# Patient Record
Sex: Male | Born: 1940
Health system: Southern US, Community
[De-identification: ages and names within clinical notes are randomized; demographics above are authoritative.]

## PROBLEM LIST (undated history)

## (undated) DIAGNOSIS — I10 Essential (primary) hypertension: Secondary | ICD-10-CM

## (undated) DIAGNOSIS — I639 Cerebral infarction, unspecified: Secondary | ICD-10-CM

## (undated) DIAGNOSIS — E119 Type 2 diabetes mellitus without complications: Secondary | ICD-10-CM

## (undated) DIAGNOSIS — M109 Gout, unspecified: Secondary | ICD-10-CM

## (undated) DIAGNOSIS — Z9889 Other specified postprocedural states: Secondary | ICD-10-CM

## (undated) DIAGNOSIS — Z8589 Personal history of malignant neoplasm of other organs and systems: Secondary | ICD-10-CM

## (undated) DIAGNOSIS — I471 Supraventricular tachycardia, unspecified: Secondary | ICD-10-CM

## (undated) DIAGNOSIS — C9000 Multiple myeloma not having achieved remission: Secondary | ICD-10-CM

## (undated) DIAGNOSIS — D62 Acute posthemorrhagic anemia: Secondary | ICD-10-CM

## (undated) DIAGNOSIS — R29898 Other symptoms and signs involving the musculoskeletal system: Secondary | ICD-10-CM

## (undated) DIAGNOSIS — M1990 Unspecified osteoarthritis, unspecified site: Secondary | ICD-10-CM

## (undated) DIAGNOSIS — C801 Malignant (primary) neoplasm, unspecified: Secondary | ICD-10-CM

## (undated) DIAGNOSIS — D649 Anemia, unspecified: Secondary | ICD-10-CM

## (undated) DIAGNOSIS — K219 Gastro-esophageal reflux disease without esophagitis: Secondary | ICD-10-CM

## (undated) DIAGNOSIS — R269 Unspecified abnormalities of gait and mobility: Secondary | ICD-10-CM

## (undated) HISTORY — PX: OTHER SURGICAL HISTORY: SHX169

## (undated) HISTORY — DX: Personal history of malignant neoplasm of other organs and systems: Z85.89

## (undated) HISTORY — DX: Cerebral infarction, unspecified: I63.9

## (undated) HISTORY — DX: Acute posthemorrhagic anemia: D62

## (undated) HISTORY — PX: SQUAMOUS CELL CARCINOMA EXCISION: SHX2433

## (undated) HISTORY — PX: BASAL CELL CARCINOMA EXCISION: SHX1214

## (undated) HISTORY — DX: Unspecified abnormalities of gait and mobility: R26.9

## (undated) HISTORY — DX: Essential (primary) hypertension: I10

## (undated) HISTORY — PX: ATRIAL FIBRILLATION ABLATION: EP1191

---

## 2008-09-23 ENCOUNTER — Emergency Department (HOSPITAL_COMMUNITY): Admission: EM | Admit: 2008-09-23 | Discharge: 2008-09-23 | Payer: Self-pay | Admitting: Emergency Medicine

## 2009-04-14 ENCOUNTER — Encounter: Admission: RE | Admit: 2009-04-14 | Discharge: 2009-04-14 | Payer: Self-pay | Admitting: Family Medicine

## 2011-09-24 LAB — DIFFERENTIAL
Basophils Relative: 1
Monocytes Absolute: 0.4
Monocytes Relative: 7
Neutro Abs: 3.3

## 2011-09-24 LAB — APTT: aPTT: 32

## 2011-09-24 LAB — CBC
HCT: 35.3 — ABNORMAL LOW
Hemoglobin: 11.7 — ABNORMAL LOW
MCHC: 33.2
MCV: 83.4
RBC: 4.23

## 2012-03-24 DIAGNOSIS — R972 Elevated prostate specific antigen [PSA]: Secondary | ICD-10-CM | POA: Diagnosis not present

## 2012-03-24 DIAGNOSIS — E119 Type 2 diabetes mellitus without complications: Secondary | ICD-10-CM | POA: Diagnosis not present

## 2012-07-06 DIAGNOSIS — M109 Gout, unspecified: Secondary | ICD-10-CM | POA: Diagnosis not present

## 2012-07-06 DIAGNOSIS — E119 Type 2 diabetes mellitus without complications: Secondary | ICD-10-CM | POA: Diagnosis not present

## 2012-07-06 DIAGNOSIS — I10 Essential (primary) hypertension: Secondary | ICD-10-CM | POA: Diagnosis not present

## 2012-07-06 DIAGNOSIS — R972 Elevated prostate specific antigen [PSA]: Secondary | ICD-10-CM | POA: Diagnosis not present

## 2012-07-14 DIAGNOSIS — L821 Other seborrheic keratosis: Secondary | ICD-10-CM | POA: Diagnosis not present

## 2012-07-14 DIAGNOSIS — C44519 Basal cell carcinoma of skin of other part of trunk: Secondary | ICD-10-CM | POA: Diagnosis not present

## 2012-07-14 DIAGNOSIS — C4402 Squamous cell carcinoma of skin of lip: Secondary | ICD-10-CM | POA: Diagnosis not present

## 2012-07-14 DIAGNOSIS — Z85828 Personal history of other malignant neoplasm of skin: Secondary | ICD-10-CM | POA: Diagnosis not present

## 2012-07-14 DIAGNOSIS — D485 Neoplasm of uncertain behavior of skin: Secondary | ICD-10-CM | POA: Diagnosis not present

## 2012-07-14 DIAGNOSIS — L570 Actinic keratosis: Secondary | ICD-10-CM | POA: Diagnosis not present

## 2012-07-14 DIAGNOSIS — L819 Disorder of pigmentation, unspecified: Secondary | ICD-10-CM | POA: Diagnosis not present

## 2012-07-14 DIAGNOSIS — D239 Other benign neoplasm of skin, unspecified: Secondary | ICD-10-CM | POA: Diagnosis not present

## 2012-07-21 DIAGNOSIS — H43819 Vitreous degeneration, unspecified eye: Secondary | ICD-10-CM | POA: Diagnosis not present

## 2012-07-28 DIAGNOSIS — C4402 Squamous cell carcinoma of skin of lip: Secondary | ICD-10-CM | POA: Diagnosis not present

## 2012-08-13 DIAGNOSIS — H43819 Vitreous degeneration, unspecified eye: Secondary | ICD-10-CM | POA: Diagnosis not present

## 2012-08-13 DIAGNOSIS — E119 Type 2 diabetes mellitus without complications: Secondary | ICD-10-CM | POA: Diagnosis not present

## 2012-08-13 DIAGNOSIS — H52209 Unspecified astigmatism, unspecified eye: Secondary | ICD-10-CM | POA: Diagnosis not present

## 2012-08-13 DIAGNOSIS — H2510 Age-related nuclear cataract, unspecified eye: Secondary | ICD-10-CM | POA: Diagnosis not present

## 2012-08-19 DIAGNOSIS — L570 Actinic keratosis: Secondary | ICD-10-CM | POA: Diagnosis not present

## 2012-08-19 DIAGNOSIS — Z85828 Personal history of other malignant neoplasm of skin: Secondary | ICD-10-CM | POA: Diagnosis not present

## 2012-09-01 DIAGNOSIS — L905 Scar conditions and fibrosis of skin: Secondary | ICD-10-CM | POA: Diagnosis not present

## 2012-09-01 DIAGNOSIS — C44519 Basal cell carcinoma of skin of other part of trunk: Secondary | ICD-10-CM | POA: Diagnosis not present

## 2012-09-28 DIAGNOSIS — Z2089 Contact with and (suspected) exposure to other communicable diseases: Secondary | ICD-10-CM | POA: Diagnosis not present

## 2012-09-28 DIAGNOSIS — R498 Other voice and resonance disorders: Secondary | ICD-10-CM | POA: Diagnosis not present

## 2012-11-27 DIAGNOSIS — Z23 Encounter for immunization: Secondary | ICD-10-CM | POA: Diagnosis not present

## 2012-12-18 DIAGNOSIS — D1801 Hemangioma of skin and subcutaneous tissue: Secondary | ICD-10-CM | POA: Diagnosis not present

## 2012-12-18 DIAGNOSIS — L570 Actinic keratosis: Secondary | ICD-10-CM | POA: Diagnosis not present

## 2012-12-18 DIAGNOSIS — D239 Other benign neoplasm of skin, unspecified: Secondary | ICD-10-CM | POA: Diagnosis not present

## 2012-12-18 DIAGNOSIS — Z85828 Personal history of other malignant neoplasm of skin: Secondary | ICD-10-CM | POA: Diagnosis not present

## 2012-12-18 DIAGNOSIS — L821 Other seborrheic keratosis: Secondary | ICD-10-CM | POA: Diagnosis not present

## 2012-12-18 DIAGNOSIS — L819 Disorder of pigmentation, unspecified: Secondary | ICD-10-CM | POA: Diagnosis not present

## 2013-01-13 DIAGNOSIS — Z1331 Encounter for screening for depression: Secondary | ICD-10-CM | POA: Diagnosis not present

## 2013-01-13 DIAGNOSIS — M109 Gout, unspecified: Secondary | ICD-10-CM | POA: Diagnosis not present

## 2013-01-13 DIAGNOSIS — R972 Elevated prostate specific antigen [PSA]: Secondary | ICD-10-CM | POA: Diagnosis not present

## 2013-01-13 DIAGNOSIS — Z125 Encounter for screening for malignant neoplasm of prostate: Secondary | ICD-10-CM | POA: Diagnosis not present

## 2013-01-13 DIAGNOSIS — I10 Essential (primary) hypertension: Secondary | ICD-10-CM | POA: Diagnosis not present

## 2013-01-13 DIAGNOSIS — E119 Type 2 diabetes mellitus without complications: Secondary | ICD-10-CM | POA: Diagnosis not present

## 2013-01-13 DIAGNOSIS — Z0000 Encounter for general adult medical examination without abnormal findings: Secondary | ICD-10-CM | POA: Diagnosis not present

## 2013-07-01 DIAGNOSIS — I10 Essential (primary) hypertension: Secondary | ICD-10-CM | POA: Diagnosis not present

## 2013-07-01 DIAGNOSIS — E119 Type 2 diabetes mellitus without complications: Secondary | ICD-10-CM | POA: Diagnosis not present

## 2013-07-01 DIAGNOSIS — M109 Gout, unspecified: Secondary | ICD-10-CM | POA: Diagnosis not present

## 2013-08-16 DIAGNOSIS — H2510 Age-related nuclear cataract, unspecified eye: Secondary | ICD-10-CM | POA: Diagnosis not present

## 2013-08-16 DIAGNOSIS — H52209 Unspecified astigmatism, unspecified eye: Secondary | ICD-10-CM | POA: Diagnosis not present

## 2013-08-16 DIAGNOSIS — H43819 Vitreous degeneration, unspecified eye: Secondary | ICD-10-CM | POA: Diagnosis not present

## 2013-08-16 DIAGNOSIS — E119 Type 2 diabetes mellitus without complications: Secondary | ICD-10-CM | POA: Diagnosis not present

## 2013-12-24 DIAGNOSIS — D239 Other benign neoplasm of skin, unspecified: Secondary | ICD-10-CM | POA: Diagnosis not present

## 2013-12-24 DIAGNOSIS — L570 Actinic keratosis: Secondary | ICD-10-CM | POA: Diagnosis not present

## 2013-12-24 DIAGNOSIS — Z85828 Personal history of other malignant neoplasm of skin: Secondary | ICD-10-CM | POA: Diagnosis not present

## 2013-12-24 DIAGNOSIS — L819 Disorder of pigmentation, unspecified: Secondary | ICD-10-CM | POA: Diagnosis not present

## 2013-12-24 DIAGNOSIS — L821 Other seborrheic keratosis: Secondary | ICD-10-CM | POA: Diagnosis not present

## 2013-12-24 DIAGNOSIS — D1801 Hemangioma of skin and subcutaneous tissue: Secondary | ICD-10-CM | POA: Diagnosis not present

## 2013-12-24 DIAGNOSIS — L820 Inflamed seborrheic keratosis: Secondary | ICD-10-CM | POA: Diagnosis not present

## 2013-12-31 DIAGNOSIS — Z23 Encounter for immunization: Secondary | ICD-10-CM | POA: Diagnosis not present

## 2014-01-11 DIAGNOSIS — L570 Actinic keratosis: Secondary | ICD-10-CM | POA: Diagnosis not present

## 2014-03-08 DIAGNOSIS — L570 Actinic keratosis: Secondary | ICD-10-CM | POA: Diagnosis not present

## 2014-04-28 DIAGNOSIS — J329 Chronic sinusitis, unspecified: Secondary | ICD-10-CM | POA: Diagnosis not present

## 2014-06-13 DIAGNOSIS — Z0000 Encounter for general adult medical examination without abnormal findings: Secondary | ICD-10-CM | POA: Diagnosis not present

## 2014-06-13 DIAGNOSIS — I10 Essential (primary) hypertension: Secondary | ICD-10-CM | POA: Diagnosis not present

## 2014-06-13 DIAGNOSIS — E119 Type 2 diabetes mellitus without complications: Secondary | ICD-10-CM | POA: Diagnosis not present

## 2014-06-13 DIAGNOSIS — M109 Gout, unspecified: Secondary | ICD-10-CM | POA: Diagnosis not present

## 2014-06-13 DIAGNOSIS — N529 Male erectile dysfunction, unspecified: Secondary | ICD-10-CM | POA: Diagnosis not present

## 2014-06-13 DIAGNOSIS — R972 Elevated prostate specific antigen [PSA]: Secondary | ICD-10-CM | POA: Diagnosis not present

## 2014-06-13 DIAGNOSIS — K219 Gastro-esophageal reflux disease without esophagitis: Secondary | ICD-10-CM | POA: Diagnosis not present

## 2014-06-13 DIAGNOSIS — Z23 Encounter for immunization: Secondary | ICD-10-CM | POA: Diagnosis not present

## 2014-06-13 DIAGNOSIS — Z125 Encounter for screening for malignant neoplasm of prostate: Secondary | ICD-10-CM | POA: Diagnosis not present

## 2014-07-25 DIAGNOSIS — M109 Gout, unspecified: Secondary | ICD-10-CM | POA: Diagnosis not present

## 2014-08-06 DIAGNOSIS — H6980 Other specified disorders of Eustachian tube, unspecified ear: Secondary | ICD-10-CM | POA: Diagnosis not present

## 2014-08-22 DIAGNOSIS — H2510 Age-related nuclear cataract, unspecified eye: Secondary | ICD-10-CM | POA: Diagnosis not present

## 2014-08-22 DIAGNOSIS — E119 Type 2 diabetes mellitus without complications: Secondary | ICD-10-CM | POA: Diagnosis not present

## 2014-08-22 DIAGNOSIS — H52209 Unspecified astigmatism, unspecified eye: Secondary | ICD-10-CM | POA: Diagnosis not present

## 2014-08-22 DIAGNOSIS — H43819 Vitreous degeneration, unspecified eye: Secondary | ICD-10-CM | POA: Diagnosis not present

## 2014-11-16 DIAGNOSIS — E119 Type 2 diabetes mellitus without complications: Secondary | ICD-10-CM | POA: Diagnosis not present

## 2014-11-16 DIAGNOSIS — Z23 Encounter for immunization: Secondary | ICD-10-CM | POA: Diagnosis not present

## 2014-12-07 ENCOUNTER — Encounter (HOSPITAL_COMMUNITY): Payer: Self-pay | Admitting: Emergency Medicine

## 2014-12-07 ENCOUNTER — Observation Stay (HOSPITAL_COMMUNITY)
Admission: EM | Admit: 2014-12-07 | Discharge: 2014-12-09 | Disposition: A | Discharge status: Home / Self Care | Payer: Medicare Other | Attending: Internal Medicine | Admitting: Internal Medicine

## 2014-12-07 ENCOUNTER — Emergency Department (HOSPITAL_COMMUNITY): Payer: Medicare Other

## 2014-12-07 DIAGNOSIS — E119 Type 2 diabetes mellitus without complications: Secondary | ICD-10-CM

## 2014-12-07 DIAGNOSIS — E118 Type 2 diabetes mellitus with unspecified complications: Secondary | ICD-10-CM

## 2014-12-07 DIAGNOSIS — R079 Chest pain, unspecified: Principal | ICD-10-CM | POA: Insufficient documentation

## 2014-12-07 DIAGNOSIS — I10 Essential (primary) hypertension: Secondary | ICD-10-CM | POA: Diagnosis not present

## 2014-12-07 DIAGNOSIS — Z7982 Long term (current) use of aspirin: Secondary | ICD-10-CM | POA: Insufficient documentation

## 2014-12-07 DIAGNOSIS — R071 Chest pain on breathing: Secondary | ICD-10-CM

## 2014-12-07 DIAGNOSIS — R0789 Other chest pain: Secondary | ICD-10-CM

## 2014-12-07 DIAGNOSIS — R918 Other nonspecific abnormal finding of lung field: Secondary | ICD-10-CM | POA: Diagnosis not present

## 2014-12-07 DIAGNOSIS — K219 Gastro-esophageal reflux disease without esophagitis: Secondary | ICD-10-CM | POA: Diagnosis not present

## 2014-12-07 DIAGNOSIS — M109 Gout, unspecified: Secondary | ICD-10-CM | POA: Diagnosis not present

## 2014-12-07 DIAGNOSIS — Z79899 Other long term (current) drug therapy: Secondary | ICD-10-CM | POA: Insufficient documentation

## 2014-12-07 HISTORY — DX: Essential (primary) hypertension: I10

## 2014-12-07 HISTORY — DX: Gastro-esophageal reflux disease without esophagitis: K21.9

## 2014-12-07 HISTORY — DX: Type 2 diabetes mellitus with unspecified complications: E11.8

## 2014-12-07 HISTORY — DX: Gout, unspecified: M10.9

## 2014-12-07 HISTORY — DX: Type 2 diabetes mellitus without complications: E11.9

## 2014-12-07 LAB — BASIC METABOLIC PANEL
ANION GAP: 16 — AB (ref 5–15)
BUN: 20 mg/dL (ref 6–23)
CALCIUM: 9.1 mg/dL (ref 8.4–10.5)
CO2: 22 mEq/L (ref 19–32)
CREATININE: 0.93 mg/dL (ref 0.50–1.35)
Chloride: 100 mEq/L (ref 96–112)
GFR, EST NON AFRICAN AMERICAN: 81 mL/min — AB (ref >90)
Glucose, Bld: 115 mg/dL — ABNORMAL HIGH (ref 70–99)
Potassium: 4 mEq/L (ref 3.7–5.3)
SODIUM: 138 meq/L (ref 137–147)

## 2014-12-07 LAB — CBC WITH DIFFERENTIAL/PLATELET
BASOS ABS: 0 10*3/uL (ref 0.0–0.1)
Basophils Relative: 0 % (ref 0–1)
EOS ABS: 0.2 10*3/uL (ref 0.0–0.7)
EOS PCT: 2 % (ref 0–5)
HCT: 35.7 % — ABNORMAL LOW (ref 39.0–52.0)
Hemoglobin: 11.6 g/dL — ABNORMAL LOW (ref 13.0–17.0)
LYMPHS ABS: 1.5 10*3/uL (ref 0.7–4.0)
Lymphocytes Relative: 20 % (ref 12–46)
MCH: 28 pg (ref 26.0–34.0)
MCHC: 32.5 g/dL (ref 30.0–36.0)
MCV: 86 fL (ref 78.0–100.0)
Monocytes Absolute: 0.4 10*3/uL (ref 0.1–1.0)
Monocytes Relative: 6 % (ref 3–12)
NEUTROS PCT: 72 % (ref 43–77)
Neutro Abs: 5.2 10*3/uL (ref 1.7–7.7)
PLATELETS: 195 10*3/uL (ref 150–400)
RBC: 4.15 MIL/uL — AB (ref 4.22–5.81)
RDW: 15.7 % — AB (ref 11.5–15.5)
WBC: 7.3 10*3/uL (ref 4.0–10.5)

## 2014-12-07 LAB — TROPONIN I

## 2014-12-07 NOTE — ED Notes (Signed)
Pt to ED via [facility] from [facility] urgent care for evaluation of CP ongoing since 2pm.  Pt reports mild chest pain with activity around 2pm that resided with rest.  Pain became present again later and pt went to urgent care.  Pt has had 324 ASA and 1 Nitro bringing pain from 4/10 to 0/10.  Pt remains pain free at this time, denies SOB or N/V.  Pt alert and oriented x 4, respirations e/u.

## 2014-12-07 NOTE — H&P (Addendum)
PCP: Derrill Memo   Chief Complaint:  Chest pain HPI: Justin [name] is a 73 y.o. male   has a past medical history of Hypertension; Diabetes mellitus without complication; GERD (gastroesophageal reflux disease); and Gout.   Presented with  At 2 pm patient was sitting at his computer and developed chest pain in the center of the chest radiating to both shoulders. Patient went to Urgent care and was given nitroglycerine which has helped. Patient reports that pain lasted for 4 hours. No associated shortness of breath. Describes pain as dull reports that taking deep breaths were making it worse. Moving around made his shoulders hurt worse. Denies any leg swelling. No recent travel hx, has active life style.  Currently chest pain free.  Patietn has hx of well controlled HTN. He has hx of DM that is diet controlled. No prior hx of CAD. Last stress test was many years ago and was unremarkable. He is unsure who did the test.   Hospitalist was called for admission for Chest pain  Review of Systems:    Pertinent positives include:  chest pain,  Constitutional:  No weight loss, night sweats, Fevers, chills, fatigue, weight loss  HEENT:  No headaches, Difficulty swallowing,Tooth/dental problems,Sore throat,  No sneezing, itching, ear ache, nasal congestion, post nasal drip,  Cardio-vascular:  No Orthopnea, PND, anasarca, dizziness, palpitations.no Bilateral lower extremity swelling  GI:  No heartburn, indigestion, abdominal pain, nausea, vomiting, diarrhea, change in bowel habits, loss of appetite, melena, blood in stool, hematemesis Resp:  no shortness of breath at rest. No dyspnea on exertion, No excess mucus, no productive cough, No non-productive cough, No coughing up of blood.No change in color of mucus.No wheezing. Skin:  no rash or lesions. No jaundice GU:  no dysuria, change in color of urine, no urgency or frequency. No straining to urinate.  No flank pain.  Musculoskeletal:  No  joint pain or no joint swelling. No decreased range of motion. No back pain.  Psych:  No change in mood or affect. No depression or anxiety. No memory loss.  Neuro: no localizing neurological complaints, no tingling, no weakness, no double vision, no gait abnormality, no slurred speech, no confusion  Otherwise ROS are negative except for above, 10 systems were reviewed  Past Medical History: Past Medical History  Diagnosis Date  . Hypertension   . Diabetes mellitus without complication   . GERD (gastroesophageal reflux disease)   . Gout    Past Surgical History  Procedure Laterality Date  . Squamous cell carcinoma excision       Medications: Prior to Admission medications   Medication Sig Start Date End Date Taking? Authorizing Provider  allopurinol (ZYLOPRIM) 300 MG tablet Take 300 mg by mouth daily.   Yes Historical Provider, MD  aspirin EC 81 MG tablet Take 81 mg by mouth daily.   Yes Historical Provider, MD  ibuprofen (ADVIL,MOTRIN) 200 MG tablet Take 400 mg by mouth every 6 (six) hours as needed for moderate pain.   Yes Historical Provider, MD  Multiple Vitamin (MULTIVITAMIN WITH MINERALS) TABS tablet Take 1 tablet by mouth as needed (TAKES OCCASIONALLY).   Yes Historical Provider, MD  omeprazole (PRILOSEC) 20 MG capsule Take 20 mg by mouth daily.   Yes Historical Provider, MD  telmisartan-hydrochlorothiazide (MICARDIS HCT) 80-12.5 MG per tablet Take 0.5 tablets by mouth daily.   Yes Historical Provider, MD    Allergies:  No Known Allergies  Social History:  Ambulatory   independently   Lives at  home With family     reports that he has never smoked. He does not have any smokeless tobacco history on file. He reports that he does not drink alcohol or use illicit drugs.    Family History: family history includes Alzheimer's disease in his father; Down syndrome in his daughter; Hypertension in his mother; Stroke in his mother.    Physical Exam: Patient Vitals for the  past 24 hrs:  BP Temp Pulse Resp SpO2 Height Weight  12/07/14 2225 121/74 mmHg - 70 12 100 % - -  12/07/14 2145 125/73 mmHg - 66 17 98 % - -  12/07/14 2115 122/73 mmHg - 72 13 96 % - -  12/07/14 2100 113/66 mmHg - 73 21 100 % - -  12/07/14 2045 117/77 mmHg - 71 12 100 % - -  12/07/14 2032 117/66 mmHg 98.7 F (37.1 C) - 17 100 % 5\' 9"  (1.753 m) 81.647 kg (180 lb)    1. General:  in No Acute distress 2. Psychological: Alert and  Oriented 3. Head/ENT:   Moist   Mucous Membranes                          Head Non traumatic, neck supple                          Normal  Dentition 4. SKIN:  decreased Skin turgor,  Skin clean Dry and intact no rash 5. Heart: Regular rate and rhythm no Murmur, Rub or gallop 6. Lungs:  no wheezes or crackles   7. Abdomen: Soft, non-tender, Non distended 8. Lower extremities: no clubbing, cyanosis, or edema 9. Neurologically Grossly intact, moving all 4 extremities equally 10. MSK: Normal range of motion  body mass index is 26.57 kg/(m^2).   Labs on Admission:   Results for orders placed or performed during the hospital encounter of 12/07/14 (from the past 24 hour(s))  CBC with Differential     Status: Abnormal   Collection Time: 12/07/14  9:22 PM  Result Value Ref Range   WBC 7.3 4.0 - 10.5 K/uL   RBC 4.15 (L) 4.22 - 5.81 MIL/uL   Hemoglobin 11.6 (L) 13.0 - 17.0 g/dL   HCT 35.7 (L) 39.0 - 52.0 %   MCV 86.0 78.0 - 100.0 fL   MCH 28.0 26.0 - 34.0 pg   MCHC 32.5 30.0 - 36.0 g/dL   RDW 15.7 (H) 11.5 - 15.5 %   Platelets 195 150 - 400 K/uL   Neutrophils Relative % 72 43 - 77 %   Neutro Abs 5.2 1.7 - 7.7 K/uL   Lymphocytes Relative 20 12 - 46 %   Lymphs Abs 1.5 0.7 - 4.0 K/uL   Monocytes Relative 6 3 - 12 %   Monocytes Absolute 0.4 0.1 - 1.0 K/uL   Eosinophils Relative 2 0 - 5 %   Eosinophils Absolute 0.2 0.0 - 0.7 K/uL   Basophils Relative 0 0 - 1 %   Basophils Absolute 0.0 0.0 - 0.1 K/uL  Troponin I     Status: None   Collection Time: 12/07/14   9:22 PM  Result Value Ref Range   Troponin I <0.30 <0.30 ng/mL  Basic metabolic panel     Status: Abnormal   Collection Time: 12/07/14  9:22 PM  Result Value Ref Range   Sodium 138 137 - 147 mEq/L   Potassium 4.0 3.7 - 5.3 mEq/L  Chloride 100 96 - 112 mEq/L   CO2 22 19 - 32 mEq/L   Glucose, Bld 115 (H) 70 - 99 mg/dL   BUN 20 6 - 23 mg/dL   Creatinine, Ser 0.93 0.50 - 1.35 mg/dL   Calcium 9.1 8.4 - 10.5 mg/dL   GFR calc non Af Amer 81 (L) >90 mL/min   GFR calc Af Amer >90 >90 mL/min   Anion gap 16 (H) 5 - 15    UA not obtained  No results found for: HGBA1C  Estimated Creatinine Clearance: 69.7 mL/min (by C-G formula based on Cr of 0.93).  BNP (last 3 results) No results for input(s): PROBNP in the last 8760 hours.  Other results:  I have pearsonaly reviewed this: ECG REPORT  Rate: 62  Rhythm: nsr ST&T Change: no ischemic changes   Filed Weights   12/07/14 2032  Weight: 81.647 kg (180 lb)     Cultures: No results found for: SDES, SPECREQUEST, CULT, REPTSTATUS   Radiological Exams on Admission: Dg Chest 2 View  12/07/2014   CLINICAL DATA:  Mid chest pain.  Pain radiates into both shoulders.  EXAM: CHEST  2 VIEW  COMPARISON:  04/14/2009  FINDINGS: Small densities at the left lung base are suggestive for atelectasis. No focal airspace disease or pulmonary edema. Heart and mediastinum are within normal limits. The trachea is midline. No acute bone abnormality.  IMPRESSION: Few densities at the left lung base are most compatible with atelectasis. No significant airspace disease.   Electronically Signed   By: Markus Daft M.D.   On: 12/07/2014 22:17    Chart has been reviewed  Assessment/Plan  73 yo M with risk factors of age, HTN DM here with somewhat atypical pain, HEART score of 4 admitted for further work up and risk strutification  Present on Admission:  . Chest pain - - given risk factors will admit, monitor on telemetry, cycle cardiac enzymes, obtain serial  ECG. Further risk stratify with lipid panel, hgA1C, obtain TSH. Make sure patient is on Aspirin. Further treatment based on the currently pending results. CURRENTLY NPO for possible stress test in AM. Given pleuritic component will obtain d.dimer if positive and given poor IV access will order VQ  Scan in AM given pleuritic component of pain . HTN (hypertension) - continue home meds DM diet controled while admitted will order SSI  Prophylaxis:  Lovenox, Protonix  CODE STATUS:  FULL CODE presumed for now, will need to discuss in AM. Patient was actively having IV placed at the time of discussion.   Other plan as per orders.  I have spent a total of 55 min on this admission  Justin [name] 12/07/2014, 11:19 PM  [facility]  Pager [phone]   after 2 AM please page floor coverage PA If 7AM-7PM, please contact the day team taking care of the patient  [email or web address]  Password [email or web address]

## 2014-12-07 NOTE — ED Provider Notes (Signed)
CSN: [id number]     Arrival date & time 12/07/14  2029 History   First MD Initiated Contact with Patient 12/07/14 2031     Chief Complaint  Patient presents with  . Chest Pain      Patient is a 73 y.o. male presenting with chest pain. The history is provided by the patient. No language interpreter was used.  Chest Pain  Mr. Till presents for evaluation of chest pain.  He reports that he developed central chest pain about 2 PM that radiated to both shoulders while he was doing light activity at his house. Pain is described as feeling like a reflux or gas. Symptoms were constant since when they began and worse with deep breaths. [facility] and was given a nitroglycerin, which resolved his chest pain completely. He is currently pain-free. He denies any fevers, cough, abdominal pain, vomiting. He had a stress test 4-5 years ago that was normal. He has diet-controlled diabetes and hypertension. He has no history of blood clots and has not had any leg swelling or pain.  Past Medical History  Diagnosis Date  . Hypertension   . Diabetes mellitus without complication   . GERD (gastroesophageal reflux disease)   . Gout    Past Surgical History  Procedure Laterality Date  . Squamous cell carcinoma excision     No family history on file. History  Substance Use Topics  . Smoking status: Never Smoker   . Smokeless tobacco: Not on file  . Alcohol Use: No    Review of Systems  Cardiovascular: Positive for chest pain.  All other systems reviewed and are negative.     Allergies  Review of patient's allergies indicates no known allergies.  Home Medications   Prior to Admission medications   Medication Sig Start Date End Date Taking? Authorizing Provider  allopurinol (ZYLOPRIM) 300 MG tablet Take 300 mg by mouth daily.   Yes Historical Provider, MD  aspirin EC 81 MG tablet Take 81 mg by mouth daily.   Yes Historical Provider, MD  ibuprofen  (ADVIL,MOTRIN) 200 MG tablet Take 400 mg by mouth every 6 (six) hours as needed for moderate pain.   Yes Historical Provider, MD  Multiple Vitamin (MULTIVITAMIN WITH MINERALS) TABS tablet Take 1 tablet by mouth as needed (TAKES OCCASIONALLY).   Yes Historical Provider, MD  omeprazole (PRILOSEC) 20 MG capsule Take 20 mg by mouth daily.   Yes Historical Provider, MD  telmisartan-hydrochlorothiazide (MICARDIS HCT) 80-12.5 MG per tablet Take 0.5 tablets by mouth daily.   Yes Historical Provider, MD   BP 117/66 mmHg  Temp(Src) 98.7 F (37.1 C)  Resp 17  Ht 5\' 9"  (1.753 m)  Wt 180 lb (81.647 kg)  BMI 26.57 kg/m2  SpO2 100% Physical Exam  Constitutional: He is oriented to person, place, and time. He appears well-developed and well-nourished.  HENT:  Head: Normocephalic and atraumatic.  Cardiovascular: Normal rate.   No murmur heard. Pulmonary/Chest: Effort normal. No respiratory distress.  Abdominal: Soft. There is no tenderness. There is no rebound and no guarding.  Musculoskeletal: He exhibits no edema or tenderness.  Neurological: He is alert and oriented to person, place, and time.  Skin: Skin is warm and dry.  Psychiatric: He has a normal mood and affect. His behavior is normal.  Nursing note and vitals reviewed.   ED Course  Procedures (including critical care time) Labs Review Labs Reviewed  CBC WITH DIFFERENTIAL - Abnormal; Notable for the following:  RBC 4.15 (*)    Hemoglobin 11.6 (*)    HCT 35.7 (*)    RDW 15.7 (*)    All other components within normal limits  BASIC METABOLIC PANEL - Abnormal; Notable for the following:    Glucose, Bld 115 (*)    GFR calc non Af Amer 81 (*)    Anion gap 16 (*)    All other components within normal limits  TROPONIN I    Imaging Review Dg Chest 2 View  12/07/2014   CLINICAL DATA:  Mid chest pain.  Pain radiates into both shoulders.  EXAM: CHEST  2 VIEW  COMPARISON:  04/14/2009  FINDINGS: Small densities at the left lung base are  suggestive for atelectasis. No focal airspace disease or pulmonary edema. Heart and mediastinum are within normal limits. The trachea is midline. No acute bone abnormality.  IMPRESSION: Few densities at the left lung base are most compatible with atelectasis. No significant airspace disease.   Electronically Signed   By: Markus Daft M.D.   On: 12/07/2014 22:17     EKG Interpretation   Date/Time:  Wednesday December 07 2014 20:32:18 EST Ventricular Rate:  62 PR Interval:  174 QRS Duration: 77 QT Interval:  402 QTC Calculation: 408 R Axis:   -6 Text Interpretation:  Sinus rhythm Confirmed by Hazle Coca [phone]) on  12/07/2014 8:40:09 PM      MDM   Final diagnoses:  Chest pain, unspecified chest pain type    Pt here for evaluation chest pain, heart score 4. Pain is resolved in ED after receiving ntg prior to arrival.  Clinical picture not c/w PE, dissection, pna.  D/w medicine regarding observation admit for stress testing.      Quintella Reichert, MD 12/07/14 2256

## 2014-12-07 NOTE — ED Notes (Addendum)
Report given to 3W. IV team placing IV, then pt will be transported to floor.

## 2014-12-08 ENCOUNTER — Other Ambulatory Visit: Payer: Self-pay | Admitting: Cardiology

## 2014-12-08 ENCOUNTER — Observation Stay (HOSPITAL_COMMUNITY): Payer: Medicare Other

## 2014-12-08 ENCOUNTER — Encounter (HOSPITAL_COMMUNITY): Payer: Self-pay | Admitting: General Practice

## 2014-12-08 DIAGNOSIS — I471 Supraventricular tachycardia: Secondary | ICD-10-CM

## 2014-12-08 DIAGNOSIS — R0789 Other chest pain: Secondary | ICD-10-CM | POA: Diagnosis not present

## 2014-12-08 DIAGNOSIS — R079 Chest pain, unspecified: Secondary | ICD-10-CM | POA: Diagnosis not present

## 2014-12-08 DIAGNOSIS — J9811 Atelectasis: Secondary | ICD-10-CM | POA: Diagnosis not present

## 2014-12-08 DIAGNOSIS — I10 Essential (primary) hypertension: Secondary | ICD-10-CM | POA: Diagnosis not present

## 2014-12-08 DIAGNOSIS — K219 Gastro-esophageal reflux disease without esophagitis: Secondary | ICD-10-CM | POA: Diagnosis not present

## 2014-12-08 DIAGNOSIS — E119 Type 2 diabetes mellitus without complications: Secondary | ICD-10-CM | POA: Diagnosis not present

## 2014-12-08 LAB — LIPID PANEL
Cholesterol: 154 mg/dL (ref 0–200)
HDL: 36 mg/dL — ABNORMAL LOW (ref >39)
LDL CALC: 95 mg/dL (ref 0–99)
Total CHOL/HDL Ratio: 4.3 RATIO
Triglycerides: 115 mg/dL (ref <150)
VLDL: 23 mg/dL (ref 0–40)

## 2014-12-08 LAB — GLUCOSE, CAPILLARY
GLUCOSE-CAPILLARY: 108 mg/dL — AB (ref 70–99)
GLUCOSE-CAPILLARY: 139 mg/dL — AB (ref 70–99)
Glucose-Capillary: 151 mg/dL — ABNORMAL HIGH (ref 70–99)
Glucose-Capillary: 119 mg/dL — ABNORMAL HIGH (ref 70–99)
Glucose-Capillary: 109 mg/dL — ABNORMAL HIGH (ref 70–99)

## 2014-12-08 LAB — TROPONIN I: Troponin I: <0.3 ng/mL (ref <0.30)

## 2014-12-08 LAB — D-DIMER, QUANTITATIVE (NOT AT ARMC): D DIMER QUANT: 0.76 ug{FEU}/mL — AB (ref 0.00–0.48)

## 2014-12-08 LAB — HEMOGLOBIN A1C
HEMOGLOBIN A1C: 7.2 % — AB (ref <5.7)
Mean Plasma Glucose: 160 mg/dL — ABNORMAL HIGH (ref <117)

## 2014-12-08 MED ORDER — ONDANSETRON HCL 4 MG/2ML IJ SOLN: 4.0000 mg | Freq: Four times a day (QID) | INTRAMUSCULAR | Status: DC | PRN

## 2014-12-08 MED ORDER — METOPROLOL TARTRATE 12.5 MG HALF TABLET
12.5000 mg | ORAL_TABLET | Freq: Two times a day (BID) | ORAL | Status: DC
  Administered 2014-12-08 – 2014-12-09 (×2): 12.5 mg via ORAL
  Filled 2014-12-08 (×2): qty 1

## 2014-12-08 MED ORDER — IOHEXOL 350 MG/ML SOLN
80.0000 mL | Freq: Once | INTRAVENOUS | Status: AC | PRN
  Administered 2014-12-08: 80 mL via INTRAVENOUS

## 2014-12-08 MED ORDER — TELMISARTAN-HCTZ 80-12.5 MG PO TABS: 0.5000 | ORAL_TABLET | Freq: Every day | ORAL | Status: DC

## 2014-12-08 MED ORDER — MORPHINE SULFATE 2 MG/ML IJ SOLN: 2.0000 mg | INTRAMUSCULAR | Status: DC | PRN

## 2014-12-08 MED ORDER — TECHNETIUM TC 99M SESTAMIBI GENERIC - CARDIOLITE
30.0000 | Freq: Once | INTRAVENOUS | Status: AC | PRN
  Administered 2014-12-08: 30 via INTRAVENOUS

## 2014-12-08 MED ORDER — INSULIN ASPART 100 UNIT/ML ~~LOC~~ SOLN: 0.0000 [IU] | SUBCUTANEOUS | Status: DC

## 2014-12-08 MED ORDER — ACETAMINOPHEN 325 MG PO TABS: 650.0000 mg | ORAL_TABLET | ORAL | Status: DC | PRN

## 2014-12-08 MED ORDER — HYDROCHLOROTHIAZIDE 10 MG/ML ORAL SUSPENSION
6.2500 mg | Freq: Every day | ORAL | Status: DC
Start: 2014-12-08 — End: 2014-12-09
  Administered 2014-12-08 – 2014-12-09 (×2): 6.25 mg via ORAL
  Filled 2014-12-08 (×2): qty 1.25

## 2014-12-08 MED ORDER — ENOXAPARIN SODIUM 40 MG/0.4ML ~~LOC~~ SOLN
40.0000 mg | Freq: Every day | SUBCUTANEOUS | Status: DC
  Administered 2014-12-08 – 2014-12-09 (×2): 40 mg via SUBCUTANEOUS
  Filled 2014-12-08 (×2): qty 0.4

## 2014-12-08 MED ORDER — ASPIRIN EC 81 MG PO TBEC
81.0000 mg | DELAYED_RELEASE_TABLET | Freq: Every day | ORAL | Status: DC
  Administered 2014-12-08 – 2014-12-09 (×2): 81 mg via ORAL
  Filled 2014-12-08 (×2): qty 1

## 2014-12-08 MED ORDER — ALLOPURINOL 300 MG PO TABS
300.0000 mg | ORAL_TABLET | Freq: Every day | ORAL | Status: DC
  Administered 2014-12-08 – 2014-12-09 (×2): 300 mg via ORAL
  Filled 2014-12-08 (×2): qty 1

## 2014-12-08 MED ORDER — IRBESARTAN 150 MG PO TABS
150.0000 mg | ORAL_TABLET | Freq: Every day | ORAL | Status: DC
  Administered 2014-12-08 – 2014-12-09 (×2): 150 mg via ORAL
  Filled 2014-12-08 (×2): qty 1

## 2014-12-08 MED ORDER — PANTOPRAZOLE SODIUM 40 MG PO TBEC
40.0000 mg | DELAYED_RELEASE_TABLET | Freq: Every day | ORAL | Status: DC
  Administered 2014-12-08 – 2014-12-09 (×2): 40 mg via ORAL
  Filled 2014-12-08 (×2): qty 1

## 2014-12-08 MED ORDER — TECHNETIUM TC 99M SESTAMIBI GENERIC - CARDIOLITE
10.0000 | Freq: Once | INTRAVENOUS | Status: AC | PRN
  Administered 2014-12-08: 10 via INTRAVENOUS

## 2014-12-08 MED ORDER — INSULIN ASPART 100 UNIT/ML ~~LOC~~ SOLN: 0.0000 [IU] | Freq: Three times a day (TID) | SUBCUTANEOUS | Status: DC

## 2014-12-08 NOTE — Progress Notes (Signed)
Patient seen and examined. No further chest pain. A&Ox3, Lungs clear, RRR, abd. Soft, NT, +BS, no edema, 2+Pulses, neurologically non-focal, no tenderness to palpation over the chest. Acute onset chest pressure at rest, symptoms lasted for several hours, seemed to be relieved very quickly with a nitro, no recurrence. Borderline diabetic, LDL around 140 (not on statin), non-smoker, HTN which is controlled. Ruled-out for NSTEMI overnight. Agree with stress testing today. He could do an exercise myoview.   Pixie Casino, MD, [facility] Attending Cardiologist [place]

## 2014-12-08 NOTE — Progress Notes (Addendum)
[facility] PROGRESS NOTE Assessment/Plan: Atypical Chest pain - Cardiac markers negative 2, her HDL is less than 40. - 12-lead EKG showed normal sinus rhythm with no T-wave abnormalities. - Currently on a statin. - Heart score 4. Nothing by mouth stress test on 12/08/2014. - He also has been on a flight this morning at 6 hours, and also past medical history of malignancy, d-dimer is positive. We'll second CT angios of the chest.  Hyperglycemia: - Pending elevated A1c pending continue sliding scale insulin. - According to him he has history of diabetes mellitus controlled with diet and exercise.  Essential  HTN (hypertension) - We'll control continue current medications.     Code Status: full Family Communication: none  Disposition Plan: inpatient   Consultants:  cardiology  Procedures:  Stress test 12.17.2015  Antibiotics:  None  HPI/Subjective: No complains  Objective: Filed Vitals:   12/07/14 2225 12/07/14 2327 12/08/14 0036 12/08/14 0411  BP: 121/74 125/74 133/69 116/72  Pulse: 70 66 64 57  Temp:   98.1 F (36.7 C) 97.8 F (36.6 C)  TempSrc:   Oral Oral  Resp: 12 18    Height:   5\' 9"  (1.753 m)   Weight:   83.099 kg (183 lb 3.2 oz)   SpO2: 100% 100% 98% 98%   No intake or output data in the 24 hours ending 12/08/14 0822 Filed Weights   12/07/14 2032 12/08/14 0036  Weight: 81.647 kg (180 lb) 83.099 kg (183 lb 3.2 oz)    Exam:  General: Alert, awake, oriented x3, in no acute distress.  HEENT: No bruits, no goiter.  Heart: Regular rate and rhythm. Lungs: Good air movement, clear Abdomen: Soft, nontender, nondistended, positive bowel sounds.  Neuro: Grossly intact, nonfocal.   Data Reviewed: Basic Metabolic Panel:  Recent Labs Lab 12/07/14 2122  NA 138  K 4.0  CL 100  CO2 22  GLUCOSE 115*  BUN 20  CREATININE 0.93  CALCIUM 9.1   Liver Function Tests: No results for input(s): AST, ALT, ALKPHOS, BILITOT, PROT, ALBUMIN in the  last 168 hours. No results for input(s): LIPASE, AMYLASE in the last 168 hours. No results for input(s): AMMONIA in the last 168 hours. CBC:  Recent Labs Lab 12/07/14 2122  WBC 7.3  NEUTROABS 5.2  HGB 11.6*  HCT 35.7*  MCV 86.0  PLT 195   Cardiac Enzymes:  Recent Labs Lab 12/07/14 2122 12/07/14 2346 12/08/14 0047 12/08/14 0516  TROPONINI <0.30 <0.30 <0.30 <0.30   BNP (last 3 results) No results for input(s): PROBNP in the last 8760 hours. CBG:  Recent Labs Lab 12/08/14 0055 12/08/14 0752  GLUCAP 108* 139*    No results found for this or any previous visit (from the past 240 hour(s)).   Studies: Dg Chest 2 View  12/07/2014   CLINICAL DATA:  Mid chest pain.  Pain radiates into both shoulders.  EXAM: CHEST  2 VIEW  COMPARISON:  04/14/2009  FINDINGS: Small densities at the left lung base are suggestive for atelectasis. No focal airspace disease or pulmonary edema. Heart and mediastinum are within normal limits. The trachea is midline. No acute bone abnormality.  IMPRESSION: Few densities at the left lung base are most compatible with atelectasis. No significant airspace disease.   Electronically Signed   By: Markus Daft M.D.   On: 12/07/2014 22:17    Scheduled Meds: . allopurinol  300 mg Oral Daily  . aspirin EC  81 mg Oral Daily  . enoxaparin (LOVENOX) injection  40 mg Subcutaneous Daily  . irbesartan  150 mg Oral Daily   And  . hydrochlorothiazide  6.25 mg Oral Daily  . insulin aspart  0-9 Units Subcutaneous 6 times per day  . pantoprazole  40 mg Oral Daily   Continuous Infusions:    Charlynne Cousins  [facility] Pager [phone]. If 8PM-8AM, please contact night-coverage at [email or web address], password [facility] 12/08/2014, 8:22 AM  LOS: 1 day

## 2014-12-08 NOTE — Progress Notes (Signed)
UR completed 

## 2014-12-08 NOTE — Progress Notes (Signed)
RN paged because pt stated he was told by cardiology that he could go home tonight. This NP called Dr. Maryland Pink and he spoke with Dr. Aileen Fass. Pt had a run of SVT during stress test today and can not go home tonight. Dr. Olevia Bowens called pt and told him. [facility] and cardiology will f/up tomorrow. Metoprolol 12.5mg  po bid started per cardio's note.  Clance Boll, NP [facility]

## 2014-12-08 NOTE — Progress Notes (Signed)
Pt completed exercise myoview without chest pain.  Once HR was 125 he had PACs, then short bursts of SVT-1 min into stage 2, we injected and at about 2 min into stage 2 he went into SVT rate of 220.  We stopped the test, and pt broke into SR though his HR was elevated for 1 min after injection  .  At rest he had another episode of SVT asymptomatic, on treadmill he had some SOB.    He again broke after vasovagal maneuver.  ST to SR which he maintained. No further PVCs and no PACs once HR decreased.  Mild ST depression in lat leads.

## 2014-12-08 NOTE — Plan of Care (Signed)
Problem: Consults Goal: Skin Care Protocol Initiated - if Braden Score 18 or less If consults are not indicated, leave blank or document N/A  Outcome: Not Applicable Date Met:  12/08/14 braden > 18 Goal: Diabetes Guidelines if Diabetic/Glucose > 140 If diabetic or lab glucose is > 140 mg/dl - Initiate Diabetes/Hyperglycemia Guidelines & Document Interventions  Outcome: Not Applicable Date Met:  12/08/14 Pt is diabetic but cbg is < 140  Problem: Phase I Progression Outcomes Goal: Aspirin unless contraindicated Outcome: Completed/Met Date Met:  12/08/14 Pt given 324 mg at Urgent care prior to arrival.     

## 2014-12-08 NOTE — Plan of Care (Signed)
Problem: Phase II Progression Outcomes Goal: Stress Test if indicated Outcome: Completed/Met Date Met:  12/08/14 Stress test done 12/08/2014.  Goal: If positive for MI, change to MI Path Outcome: Not Applicable Date Met:  89/16/94 Enzymes negative times 3.

## 2014-12-08 NOTE — Progress Notes (Signed)
Stress test was non-ischemic. He developed SVT during the study which spontaneously terminated. CT was reviewed and was negative for PE - there may be coronary calcium. I would recommend starting low dose metoprolol tartrate 12.5 mg BID. Can follow-up with me as an outpatient in 3-4 weeks. Would continue low dose ASA 81 mg daily.  Pixie Casino, MD, [facility] Attending Cardiologist [place]

## 2014-12-09 DIAGNOSIS — R0789 Other chest pain: Secondary | ICD-10-CM | POA: Diagnosis not present

## 2014-12-09 DIAGNOSIS — K219 Gastro-esophageal reflux disease without esophagitis: Secondary | ICD-10-CM | POA: Diagnosis not present

## 2014-12-09 DIAGNOSIS — I471 Supraventricular tachycardia: Secondary | ICD-10-CM

## 2014-12-09 DIAGNOSIS — E119 Type 2 diabetes mellitus without complications: Secondary | ICD-10-CM | POA: Diagnosis not present

## 2014-12-09 DIAGNOSIS — I10 Essential (primary) hypertension: Secondary | ICD-10-CM | POA: Diagnosis not present

## 2014-12-09 DIAGNOSIS — R079 Chest pain, unspecified: Secondary | ICD-10-CM | POA: Diagnosis not present

## 2014-12-09 LAB — GLUCOSE, CAPILLARY
GLUCOSE-CAPILLARY: 213 mg/dL — AB (ref 70–99)
Glucose-Capillary: 102 mg/dL — ABNORMAL HIGH (ref 70–99)

## 2014-12-09 MED ORDER — ATORVASTATIN CALCIUM 40 MG PO TABS: 40.0000 mg | ORAL_TABLET | Freq: Every day | ORAL | Status: DC

## 2014-12-09 MED ORDER — METOPROLOL TARTRATE 12.5 MG HALF TABLET: 12.5000 mg | ORAL_TABLET | Freq: Two times a day (BID) | ORAL | Status: DC

## 2014-12-09 MED ORDER — METFORMIN HCL 500 MG PO TABS: 500.0000 mg | ORAL_TABLET | Freq: Two times a day (BID) | ORAL | Status: DC

## 2014-12-09 NOTE — Discharge Instructions (Signed)

## 2014-12-09 NOTE — Progress Notes (Signed)
TELEMETRY: Reviewed telemetry pt in NSR. There is some nonphysiologic artifact (seen on 2 leads but 3rd lead is normal) : Filed Vitals:   12/08/14 2140 12/08/14 2339 12/09/14 0439 12/09/14 1013  BP: 133/80 98/76 112/63 121/68  Pulse: 75 65 98 57  Temp:  97.8 F (36.6 C) 97.9 F (36.6 C)   TempSrc:  Oral Oral   Resp:  18 18   Height:      Weight:   182 lb 14.4 oz (82.963 kg)   SpO2:  99% 97%     Intake/Output Summary (Last 24 hours) at 12/09/14 1119 Last data filed at 12/08/14 2141  Gross per 24 hour  Intake    960 ml  Output      3 ml  Net    957 ml   Filed Weights   12/07/14 2032 12/08/14 0036 12/09/14 0439  Weight: 180 lb (81.647 kg) 183 lb 3.2 oz (83.099 kg) 182 lb 14.4 oz (82.963 kg)    Subjective No complaints today.  [name] Kitchen allopurinol  300 mg Oral Daily  . aspirin EC  81 mg Oral Daily  . enoxaparin (LOVENOX) injection  40 mg Subcutaneous Daily  . irbesartan  150 mg Oral Daily   And  . hydrochlorothiazide  6.25 mg Oral Daily  . insulin aspart  0-9 Units Subcutaneous TID WC  . metoprolol tartrate  12.5 mg Oral BID  . pantoprazole  40 mg Oral Daily      LABS: Basic Metabolic Panel:  Recent Labs  12/07/14 2122  NA 138  K 4.0  CL 100  CO2 22  GLUCOSE 115*  BUN 20  CREATININE 0.93  CALCIUM 9.1   Liver Function Tests: No results for input(s): AST, ALT, ALKPHOS, BILITOT, PROT, ALBUMIN in the last 72 hours. No results for input(s): LIPASE, AMYLASE in the last 72 hours. CBC:  Recent Labs  12/07/14 2122  WBC 7.3  NEUTROABS 5.2  HGB 11.6*  HCT 35.7*  MCV 86.0  PLT 195   Cardiac Enzymes:  Recent Labs  12/08/14 0047 12/08/14 0516 12/08/14 1309  TROPONINI <0.30 <0.30 <0.30   BNP: No results for input(s): PROBNP in the last 72 hours. D-Dimer:  Recent Labs  12/07/14 2338  DDIMER 0.76*   Hemoglobin A1C:  Recent Labs  12/08/14 0047  HGBA1C 7.2*   Fasting Lipid Panel:  Recent Labs  12/08/14 0516  CHOL 154  HDL 36*  LDLCALC 95    TRIG 115  CHOLHDL 4.3   Thyroid Function Tests: No results for input(s): TSH, T4TOTAL, T3FREE, THYROIDAB in the last 72 hours.  Invalid input(s): FREET3   Radiology/Studies:  Dg Chest 2 View  12/07/2014   CLINICAL DATA:  Mid chest pain.  Pain radiates into both shoulders.  EXAM: CHEST  2 VIEW  COMPARISON:  04/14/2009  FINDINGS: Small densities at the left lung base are suggestive for atelectasis. No focal airspace disease or pulmonary edema. Heart and mediastinum are within normal limits. The trachea is midline. No acute bone abnormality.  IMPRESSION: Few densities at the left lung base are most compatible with atelectasis. No significant airspace disease.   Electronically Signed   By: Markus Daft M.D.   On: 12/07/2014 22:17   Ct Angio Chest Pe W/cm &/or Wo Cm  12/08/2014   CLINICAL DATA:  Chest pain  EXAM: CT ANGIOGRAPHY CHEST WITH CONTRAST  TECHNIQUE: Multidetector CT imaging of the chest was performed using the standard protocol during bolus administration of intravenous contrast. Multiplanar CT image  reconstructions and MIPs were obtained to evaluate the vascular anatomy.  CONTRAST:  98mL OMNIPAQUE IOHEXOL 350 MG/ML SOLN  COMPARISON:  None.  FINDINGS: Mediastinum: Moderate cardiac enlargement. There is no pericardial effusion identified. The trachea appears patent and is midline. Normal appearance of the esophagus. There is no enlarged mediastinal or hilar adenopathy. No axillary or supraclavicular adenopathy. No filling defects identified within the main pulmonary artery or its branches.  Lungs/Pleura: There is atelectasis identified within both lung bases. No airspace consolidation identified.  Upper Abdomen: Incidental imaging within the upper abdomen shows a mixed attenuation structure measuring 3.5 x 2.6 cm.  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 There is a mild curvature which is convex  towards the right.  Review of the MIP images confirms the above findings.  IMPRESSION: 1. No evidence for acute pulmonary embolus. 2. Bibasilar atelectasis. 3. Mixed attenuation structure arising from the right kidney. Recommend further evaluation with nonemergent CT or MRI of the upper abdomen without and with contrast material.   Electronically Signed   By: Kerby Moors M.D.   On: 12/08/2014 15:38   Nm Myocar Multi W/spect W/wall Motion / Ef  12/08/2014   ADDENDUM REPORT: 12/08/2014 13:14  ADDENDUM: Low risk stress myoview with a small sized, medium intensity fixed defect in the inferior wall consistent with diaphragmatic attenuation with no ischemia. Electrically positive EKG only during SVT.   Electronically Signed   By: Fransico Him   On: 12/08/2014 13:14   12/08/2014   CLINICAL DATA:  Chest pain  EXAM: MYOCARDIAL IMAGING WITH SPECT (REST AND EXERCISE)  GATED LEFT VENTRICULAR WALL MOTION STUDY  LEFT VENTRICULAR EJECTION FRACTION  TECHNIQUE: Standard myocardial SPECT imaging was performed after resting intravenous injection of 10 mCi Tc-34m sestamibi. Subsequently, exercise tolerance test was performed by the patient under the supervision of the Cardiology staff. At peak-stress, 30 mCi Tc-70m sestamibi was injected intravenously and standard myocardial SPECT imaging was performed. Quantitative gated imaging was also performed to evaluate left ventricular wall motion, and estimate left ventricular ejection fraction.  COMPARISON:  None.  FINDINGS: Baseline EKG: NSR with NSR with no ST changes. The patient exercised on the Standard Bruce protocol for 5 minutes and 3 seconds achieving 7 mets. Maximum HR achieved was 226bpm which was 154% of MPHR. During the exercise there were frequent PAC's and PVC's as well as ventricular couplets. The patient developed SVT at 226bpm but had no symptoms except for mild SOB. Exercise was stopped and patient broke into NSR but developed another short burst of SVT at rest  which spontaneously broke to NSR. During SVT there was 1-15mm of horizontal ST segment depression in the lateral precordial leads. The ST changes quickly resolved once the patient went back in to NSR.  RAW images show mild diaphragmatic attenuation.  Perfusion: There is a small sized, medium intensity fixed defect in the inferior wall consistent with diaphragmatic attenuation. No evidence of inducible ischemia.  Wall Motion: Normal left ventricular wall motion. No left ventricular dilation.  Left Ventricular Ejection Fraction: 63 %  End diastolic volume 58 ml  End systolic volume 22 ml  IMPRESSION: 1. No reversible ischemia or infarction.  2. Normal left ventricular wall motion.  3. Left ventricular ejection fraction 63 %  4. Low-risk stress test findings*. No evidence of inducible ischemia by perfusion imaging. Positive EKG for ischemia during SVT only.  *2012 Appropriate Use Criteria for Coronary Revascularization Focused Update:  J Am Coll Cardiol. 5883;25(4):982-641. http://content.[email or web address]?articleid=[email or web address]  Electronically Signed: By: Fransico Him On: 12/08/2014 13:09    PHYSICAL EXAM General: Well developed, well nourished, in no acute distress. Head: Normocephalic, atraumatic, sclera non-icteric, oropharynx is clear Neck: Negative for carotid bruits. JVD not elevated. No adenopathy Lungs: Clear bilaterally to auscultation without wheezes, rales, or rhonchi. Breathing is unlabored. Heart: RRR S1 S2 without murmurs, rubs, or gallops.  Abdomen: Soft, non-tender, non-distended with normoactive bowel sounds. No hepatomegaly. No rebound/guarding. No obvious abdominal masses. Msk:  Strength and tone appears normal for age. Extremities: No clubbing, cyanosis or edema.  Distal pedal pulses are 2+ and equal bilaterally. Neuro: Alert and oriented X 3. Moves all extremities spontaneously. Psych:  Responds to questions appropriately with a normal affect.  ASSESSMENT AND PLAN: 1. Chest  pain. Myoview study negative for ischemia and EF is normal. No chest pain currently. CT of the chest is normal. OK for DC today. 2. SVT- only noted during stress test but rate quite high 200-220. Now on low dose metoprolol. Probably will not tolerate higher doses due to bradycardia. Will arrange follow up with EP as outpatient. 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 He understands he is not to fly until cleared by EP. Avoid caffeine.   Present on Admission:  . Chest pain . HTN (hypertension)  Signed, Cherril Hett [place], [place] 12/09/2014 11:19 AM

## 2014-12-09 NOTE — Discharge Summary (Signed)
Physician Discharge Summary  Justin [name] PRF:[id number] DOB: May 13, 1941 DOA: 12/07/2014  PCP: No primary care provider on file.  Admit date: 12/07/2014 Discharge date: 12/09/2014  Time spent: 35 minutes  Recommendations for Outpatient Follow-up:  1. Follow up with EP as an outpatient. 2. Pt cannot fly until he is cleared by EP. 3. Echo as an outpatient.   Discharge Diagnoses:  Active Problems:   Chest pain   Diabetes mellitus   HTN (hypertension)   Pain in the chest   Discharge Condition: stable  Diet recommendation: heart healthy  Filed Weights   12/07/14 2032 12/08/14 0036 12/09/14 0439  Weight: 81.647 kg (180 lb) 83.099 kg (183 lb 3.2 oz) 82.963 kg (182 lb 14.4 oz)    History of present illness:  At 2 pm patient was sitting at his computer and developed chest pain in the center of the chest radiating to both shoulders. Patient went to Urgent care and was given nitroglycerine which has helped. Patient reports that pain lasted for 4 hours. No associated shortness of breath. Describes pain as dull reports that taking deep breaths were making it worse. Moving around made his shoulders hurt worse. Denies any leg swelling. No recent travel hx, has active life style  Hospital Course:  Atypical Chest pain - Cardiac markers negative 2, her HDL is less than 40. - 12-lead EKG showed normal sinus rhythm with no T-wave abnormalities. - Currently on a statin. - Stress test on 12/08/2014 had to be stop as he develop SVT - He also has been on a flight this morning at 6 hours, and also past medical history of malignancy, d-dimer is positive. - CT angio of the chest negative for PE. - Will need echo as an outpatient and EP evaluation - pt has been instructed  On not flying.   Hyperglycemia: - A1c 7.2 - start metformin  Essential HTN (hypertension) - We'll control continue current medications  Procedures:  Stress test  Consultations:  cardiology  Discharge Exam: Filed  Vitals:   12/09/14 1013  BP: 121/68  Pulse: 57  Temp:   Resp:     General: A&O x3 Cardiovascular: RRR Respiratory: good air movement CTA B/L  Discharge Instructions You were cared for by a hospitalist during your hospital stay. If you have any questions about your discharge medications or the care you received while you were in the hospital after you are discharged, you can call the unit and asked to speak with the hospitalist on call if the hospitalist that took care of you is not available. Once you are discharged, your primary care physician will handle any further medical issues. Please note that NO REFILLS for any discharge medications will be authorized once you are discharged, as it is imperative that you return to your primary care physician (or establish a relationship with a primary care physician if you do not have one) for your aftercare needs so that they can reassess your need for medications and monitor your lab values.  Discharge Instructions    Diet - low sodium heart healthy    Complete by:  As directed      Increase activity slowly    Complete by:  As directed           Current Discharge Medication List    START taking these medications   Details  atorvastatin (LIPITOR) 40 MG tablet Take 1 tablet (40 mg total) by mouth daily. Qty: 30 tablet, Refills: 0    metFORMIN (GLUCOPHAGE) 500 MG tablet  Take 1 tablet (500 mg total) by mouth 2 (two) times daily with a meal. Qty: 60 tablet, Refills: 3    metoprolol tartrate (LOPRESSOR) 12.5 mg TABS tablet Take 0.5 tablets (12.5 mg total) by mouth 2 (two) times daily. Qty: 60 tablet, Refills: 0      CONTINUE these medications which have NOT CHANGED   Details  allopurinol (ZYLOPRIM) 300 MG tablet Take 300 mg by mouth daily.    aspirin EC 81 MG tablet Take 81 mg by mouth daily.    ibuprofen (ADVIL,MOTRIN) 200 MG tablet Take 400 mg by mouth every 6 (six) hours as needed for moderate pain.    Multiple Vitamin  (MULTIVITAMIN WITH MINERALS) TABS tablet Take 1 tablet by mouth as needed (TAKES OCCASIONALLY).    omeprazole (PRILOSEC) 20 MG capsule Take 20 mg by mouth daily.    telmisartan-hydrochlorothiazide (MICARDIS HCT) 80-12.5 MG per tablet Take 0.5 tablets by mouth daily.       No Known Allergies Follow-up Information    Follow up with Pixie Casino, MD On 12/13/2014.   Specialty:  Cardiology   Why:  cardiologist at 3:45 pm   Contact information:   [place] [address] [phone]       Follow up with [facility] Device [facility].   Why:  the office will call you to schedule a monitor to wear, this is to see if you have more of the fast HR.  you will wear for 30 days.  you may need to pick it up at our [address] office.  Ask when they call or their number is [phone]        The results of significant diagnostics from this hospitalization (including imaging, microbiology, ancillary and laboratory) are listed below for reference.    Significant Diagnostic Studies: Dg Chest 2 View  12/07/2014   CLINICAL DATA:  Mid chest pain.  Pain radiates into both shoulders.  EXAM: CHEST  2 VIEW  COMPARISON:  04/14/2009  FINDINGS: Small densities at the left lung base are suggestive for atelectasis. No focal airspace disease or pulmonary edema. Heart and mediastinum are within normal limits. The trachea is midline. No acute bone abnormality.  IMPRESSION: Few densities at the left lung base are most compatible with atelectasis. No significant airspace disease.   Electronically Signed   By: Markus Daft M.D.   On: 12/07/2014 22:17   Ct Angio Chest Pe W/cm &/or Wo Cm  12/08/2014   CLINICAL DATA:  Chest pain  EXAM: CT ANGIOGRAPHY CHEST WITH CONTRAST  TECHNIQUE: Multidetector CT imaging of the chest was performed using the standard protocol during bolus administration of intravenous contrast. Multiplanar CT image reconstructions and MIPs were obtained to evaluate the vascular anatomy.   CONTRAST:  33mL OMNIPAQUE IOHEXOL 350 MG/ML SOLN  COMPARISON:  None.  FINDINGS: Mediastinum: Moderate cardiac enlargement. There is no pericardial effusion identified. The trachea appears patent and is midline. Normal appearance of the esophagus. There is no enlarged mediastinal or hilar adenopathy. No axillary or supraclavicular adenopathy. No filling defects identified within the main pulmonary artery or its branches.  Lungs/Pleura: There is atelectasis identified within both lung bases. No airspace consolidation identified.  Upper Abdomen: Incidental imaging within the upper abdomen shows a mixed attenuation structure measuring 3.5 x 2.6 cm.  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 There is a mild curvature which is convex towards the right.  Review of the  MIP images confirms the above findings.  IMPRESSION: 1. No evidence for acute pulmonary embolus. 2. Bibasilar atelectasis. 3. Mixed attenuation structure arising from the right kidney. Recommend further evaluation with nonemergent CT or MRI of the upper abdomen without and with contrast material.   Electronically Signed   By: Kerby Moors M.D.   On: 12/08/2014 15:38   Nm Myocar Multi W/spect W/wall Motion / Ef  12/08/2014   ADDENDUM REPORT: 12/08/2014 13:14  ADDENDUM: Low risk stress myoview with a small sized, medium intensity fixed defect in the inferior wall consistent with diaphragmatic attenuation with no ischemia. Electrically positive EKG only during SVT.   Electronically Signed   By: Fransico Him   On: 12/08/2014 13:14   12/08/2014   CLINICAL DATA:  Chest pain  EXAM: MYOCARDIAL IMAGING WITH SPECT (REST AND EXERCISE)  GATED LEFT VENTRICULAR WALL MOTION STUDY  LEFT VENTRICULAR EJECTION FRACTION  TECHNIQUE: Standard myocardial SPECT imaging was performed after resting intravenous injection of 10 mCi Tc-58m sestamibi. Subsequently, exercise  tolerance test was performed by the patient under the supervision of the Cardiology staff. At peak-stress, 30 mCi Tc-69m sestamibi was injected intravenously and standard myocardial SPECT imaging was performed. Quantitative gated imaging was also performed to evaluate left ventricular wall motion, and estimate left ventricular ejection fraction.  COMPARISON:  None.  FINDINGS: Baseline EKG: NSR with NSR with no ST changes. The patient exercised on the Standard Bruce protocol for 5 minutes and 3 seconds achieving 7 mets. Maximum HR achieved was 226bpm which was 154% of MPHR. During the exercise there were frequent PAC's and PVC's as well as ventricular couplets. The patient developed SVT at 226bpm but had no symptoms except for mild SOB. Exercise was stopped and patient broke into NSR but developed another short burst of SVT at rest which spontaneously broke to NSR. During SVT there was 1-61mm of horizontal ST segment depression in the lateral precordial leads. The ST changes quickly resolved once the patient went back in to NSR.  RAW images show mild diaphragmatic attenuation.  Perfusion: There is a small sized, medium intensity fixed defect in the inferior wall consistent with diaphragmatic attenuation. No evidence of inducible ischemia.  Wall Motion: Normal left ventricular wall motion. No left ventricular dilation.  Left Ventricular Ejection Fraction: 63 %  End diastolic volume 58 ml  End systolic volume 22 ml  IMPRESSION: 1. No reversible ischemia or infarction.  2. Normal left ventricular wall motion.  3. Left ventricular ejection fraction 63 %  4. Low-risk stress test findings*. No evidence of inducible ischemia by perfusion imaging. Positive EKG for ischemia during SVT only.  *2012 Appropriate Use Criteria for Coronary Revascularization Focused Update: J Am Coll Cardiol. 7517;00(1):749-449. http://content.[email or web address].aspx?articleid=[email or web address]  Electronically Signed: By: Fransico Him On: 12/08/2014  13:09    Microbiology: No results found for this or any previous visit (from the past 240 hour(s)).   Labs: Basic Metabolic Panel:  Recent Labs Lab 12/07/14 2122  NA 138  K 4.0  CL 100  CO2 22  GLUCOSE 115*  BUN 20  CREATININE 0.93  CALCIUM 9.1   Liver Function Tests: No results for input(s): AST, ALT, ALKPHOS, BILITOT, PROT, ALBUMIN in the last 168 hours. No results for input(s): LIPASE, AMYLASE in the last 168 hours. No results for input(s): AMMONIA in the last 168 hours. CBC:  Recent Labs Lab 12/07/14 2122  WBC 7.3  NEUTROABS 5.2  HGB 11.6*  HCT 35.7*  MCV 86.0  PLT 195   Cardiac Enzymes:  Recent Labs Lab 12/07/14 2122 12/07/14 2346 12/08/14 0047 12/08/14 0516 12/08/14 1309  TROPONINI <0.30 <0.30 <0.30 <0.30 <0.30   BNP: BNP (last 3 results) No results for input(s): PROBNP in the last 8760 hours. CBG:  Recent Labs Lab 12/08/14 0752 12/08/14 1200 12/08/14 1714 12/08/14 2023 12/09/14 0843  GLUCAP 139* 151* 119* 109* 213*       Signed:  Charlynne Cousins  [facility] 12/09/2014, 11:24 AM

## 2014-12-13 ENCOUNTER — Ambulatory Visit (INDEPENDENT_AMBULATORY_CARE_PROVIDER_SITE_OTHER): Payer: Medicare Other | Admitting: Internal Medicine

## 2014-12-13 ENCOUNTER — Encounter: Payer: Self-pay | Admitting: Internal Medicine

## 2014-12-13 DIAGNOSIS — R0789 Other chest pain: Secondary | ICD-10-CM | POA: Diagnosis not present

## 2014-12-13 DIAGNOSIS — E119 Type 2 diabetes mellitus without complications: Secondary | ICD-10-CM

## 2014-12-13 DIAGNOSIS — Z8679 Personal history of other diseases of the circulatory system: Secondary | ICD-10-CM

## 2014-12-13 DIAGNOSIS — E785 Hyperlipidemia, unspecified: Secondary | ICD-10-CM | POA: Diagnosis not present

## 2014-12-13 DIAGNOSIS — I471 Supraventricular tachycardia: Secondary | ICD-10-CM | POA: Diagnosis not present

## 2014-12-13 DIAGNOSIS — I10 Essential (primary) hypertension: Secondary | ICD-10-CM

## 2014-12-13 DIAGNOSIS — Z9889 Other specified postprocedural states: Secondary | ICD-10-CM

## 2014-12-13 HISTORY — DX: Personal history of other diseases of the circulatory system: Z86.79

## 2014-12-13 HISTORY — DX: Hyperlipidemia, unspecified: E78.5

## 2014-12-13 MED ORDER — ATORVASTATIN CALCIUM 10 MG PO TABS: 10.0000 mg | ORAL_TABLET | Freq: Every day | ORAL | Status: DC

## 2014-12-13 NOTE — Patient Instructions (Signed)
Your physician has recommended you make the following change in your medication: DECREASE atorvastatin (lipitor) to 10mg  daily   Your physician recommends that you return for lab work in 3 months - fasting   Your physician wants you to follow-up in: 6 months with Dr. Debara Pickett. You will receive a reminder letter in the mail two months in advance. If you don't receive a letter, please call our office to schedule the follow-up appointment.

## 2014-12-13 NOTE — Progress Notes (Signed)
OFFICE NOTE  Chief Complaint:  Hospital follow-up  Primary Care Physician: Justin Neer, MD  HPI:  Justin [name] is a pleasant 73 year old male who I met recently in the hospital. He presented with chest pain that he developed while sitting his computer. The symptoms had some typical and atypical features for angina. He ruled out for MI and underwent nuclear stress testing which was negative for ischemia. During the stress test while exercising he developed an SVT with heart rate that shot up abruptly to 220. This responded to vagal maneuvers and then reoccurred and eventually subsided. He was started on low-dose beta blocker and eventually discharged without any recurrent symptoms. He was placed on a monitor and has follow-up with cardiac electrophysiology to see Dr. Lovena [name] in January. 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 He was started on Lipitor which was started the hospital for for coronary disease, however his LDL is only 95. He is a diabetic. His metformin was increased to 500 twice a day which is improved his blood sugars. He recently saw his primary care provider felt that he was doing well.  PMHx:  Past Medical History  Diagnosis Date  . Hypertension   . Diabetes mellitus without complication   . GERD (gastroesophageal reflux disease)   . Gout     Past Surgical History  Procedure Laterality Date  . Squamous cell carcinoma excision    . Basal cell carcinoma excision      off of back    FAMHx:  Family History  Problem Relation Age of Onset  . Stroke Mother   . Hypertension Mother   . Alzheimer's disease Father   . Down syndrome Daughter     SOCHx:   reports that he has never smoked. He does not have any smokeless tobacco history on file. He reports that he does not drink alcohol or use illicit drugs.  ALLERGIES:  No Known Allergies  ROS: A  comprehensive review of systems was negative.  HOME MEDS: Current Outpatient Prescriptions  Medication Sig Dispense Refill  . allopurinol (ZYLOPRIM) 300 MG tablet Take 300 mg by mouth daily.    [name] Kitchen aspirin EC 81 MG tablet Take 81 mg by mouth daily.    [name] Kitchen ibuprofen (ADVIL,MOTRIN) 200 MG tablet Take 400 mg by mouth every 6 (six) hours as needed for moderate pain.    . metFORMIN (GLUCOPHAGE) 500 MG tablet Take 1 tablet (500 mg total) by mouth 2 (two) times daily with a meal. 60 tablet 3  . metoprolol tartrate (LOPRESSOR) 12.5 mg TABS tablet Take 0.5 tablets (12.5 mg total) by mouth 2 (two) times daily. 60 tablet 0  . Multiple Vitamin (MULTIVITAMIN WITH MINERALS) TABS tablet Take 1 tablet by mouth as needed (TAKES OCCASIONALLY).    [name] Kitchen omeprazole (PRILOSEC) 20 MG capsule Take 20 mg by mouth daily.    [name] Kitchen telmisartan-hydrochlorothiazide (MICARDIS HCT) 80-12.5 MG per tablet Take 0.5 tablets by mouth daily.    [name] Kitchen atorvastatin (LIPITOR) 10 MG tablet Take 1 tablet (10 mg total) by mouth daily. 90 tablet 1   No current facility-administered medications for this visit.    LABS/IMAGING: No results found for this or any previous visit (from the past 48 hour(s)). No results found.  VITALS: BP 108/68 mmHg  Pulse 64  Ht 5' 9"  (1.753 m)  Wt 185 lb 11.2 oz (84.233 kg)  BMI 27.41 kg/m2  EXAM: General appearance: alert and no distress Neck: no carotid bruit and no JVD Lungs: clear to auscultation bilaterally Heart: regular rate and rhythm, S1, S2 normal, no murmur, click, rub or gallop Abdomen: soft, non-tender; bowel sounds normal; no masses,  no organomegaly Extremities: extremities normal, atraumatic, no cyanosis or edema Pulses: 2+ and symmetric Skin: Skin color, texture, turgor normal. No rashes or lesions Neurologic: Grossly normal  EKG: Deferred  ASSESSMENT: 1. Recent chest pain with a negative nuclear stress test 2. PSVT 3. Diabetes type 2 4. Relative dyslipidemia  PLAN: 1.   [name]  is doing well without recurrent chest pain symptoms. He was not really aware of his SVT. This occurred during exercise. He is now tolerating low-dose beta blocker. Blood pressure is controlled. He was started on a statin due to chest pain symptoms however I agree that 40 mg of Lipitor is probably excessive given his recent LDL cholesterol which was 80 in November. I do think he benefit from low-dose statin with a goal LDL less than 70 as a diabetic. I recommend him going on 10 mg a Lipitor daily. He is willing to take the medication. He will continue wearing a monitor and follow-up with Dr. Lovena [name], one of my partners in cardiac electrophysiology, for further recommendations regarding his SVT. Hopefully he can continue on beta blocker. 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  Plan to see him back in 6 months.  Justin Casino, MD, [facility] Attending Cardiologist [facility]  [name],Justin C 12/13/2014, 6:11 PM

## 2014-12-20 ENCOUNTER — Encounter: Payer: Self-pay | Admitting: *Deleted

## 2014-12-20 ENCOUNTER — Encounter (INDEPENDENT_AMBULATORY_CARE_PROVIDER_SITE_OTHER): Payer: Medicare Other

## 2014-12-20 DIAGNOSIS — I471 Supraventricular tachycardia: Secondary | ICD-10-CM

## 2014-12-20 NOTE — Progress Notes (Signed)
Patient ID: Justin [name], male   DOB: 1941-07-30, 73 y.o.   MRN: [id number] [employer] 30 day cardiac event monitor applied to patient.

## 2014-12-28 DIAGNOSIS — Z86018 Personal history of other benign neoplasm: Secondary | ICD-10-CM | POA: Diagnosis not present

## 2014-12-28 DIAGNOSIS — L821 Other seborrheic keratosis: Secondary | ICD-10-CM | POA: Diagnosis not present

## 2014-12-28 DIAGNOSIS — L570 Actinic keratosis: Secondary | ICD-10-CM | POA: Diagnosis not present

## 2014-12-28 DIAGNOSIS — Z85828 Personal history of other malignant neoplasm of skin: Secondary | ICD-10-CM | POA: Diagnosis not present

## 2014-12-28 DIAGNOSIS — D1801 Hemangioma of skin and subcutaneous tissue: Secondary | ICD-10-CM | POA: Diagnosis not present

## 2014-12-28 DIAGNOSIS — L814 Other melanin hyperpigmentation: Secondary | ICD-10-CM | POA: Diagnosis not present

## 2014-12-28 DIAGNOSIS — D239 Other benign neoplasm of skin, unspecified: Secondary | ICD-10-CM | POA: Diagnosis not present

## 2015-01-09 ENCOUNTER — Encounter: Payer: Self-pay | Admitting: Internal Medicine

## 2015-01-09 ENCOUNTER — Ambulatory Visit (INDEPENDENT_AMBULATORY_CARE_PROVIDER_SITE_OTHER): Payer: Medicare Other | Admitting: Internal Medicine

## 2015-01-09 VITALS — BP 116/68 | HR 71 | Ht 69.0 in | Wt 183.0 lb

## 2015-01-09 DIAGNOSIS — E785 Hyperlipidemia, unspecified: Secondary | ICD-10-CM | POA: Diagnosis not present

## 2015-01-09 DIAGNOSIS — I10 Essential (primary) hypertension: Secondary | ICD-10-CM

## 2015-01-09 DIAGNOSIS — E119 Type 2 diabetes mellitus without complications: Secondary | ICD-10-CM | POA: Diagnosis not present

## 2015-01-09 DIAGNOSIS — M109 Gout, unspecified: Secondary | ICD-10-CM | POA: Diagnosis not present

## 2015-01-09 DIAGNOSIS — I471 Supraventricular tachycardia: Secondary | ICD-10-CM | POA: Diagnosis not present

## 2015-01-09 DIAGNOSIS — M549 Dorsalgia, unspecified: Secondary | ICD-10-CM | POA: Diagnosis not present

## 2015-01-09 NOTE — Patient Instructions (Signed)
Your physician recommends that you continue on your current medications as directed. Please refer to the Current Medication list given to you today.     

## 2015-01-09 NOTE — Assessment & Plan Note (Signed)
I have outlined the treatment options with the patient. He has symptomatic SVT and would be a candidate for catheter ablation. 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 He is considering his options. I spent a long time discussing them with this patient.

## 2015-01-09 NOTE — Progress Notes (Signed)
HPI Justin [name] is referred today by Dr. Debara Pickett for evaluation and management of SVT. The patient has a h/o chest pain and was seen in the hospital. He had a stress test and with exertion had very dense ectopy and then went into SVT at 220/min. The was terminated with vagal maneuvers. The patient does not have much in the way of palpitations. He does have chest pressure which has been negative in evaluation for CAD.  He has not had syncope. 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 No Known Allergies   Current Outpatient Prescriptions  Medication Sig Dispense Refill  . allopurinol (ZYLOPRIM) 300 MG tablet Take 300 mg by mouth daily.    [name] Kitchen aspirin EC 81 MG tablet Take 81 mg by mouth daily.    [name] Kitchen atorvastatin (LIPITOR) 10 MG tablet Take 1 tablet (10 mg total) by mouth daily. 90 tablet 1  . fluorouracil (EFUDEX) 5 % cream APPLY 1 APPLICATION TO AFFECTED AREA DAILY x 2 WEEKS, STOP x 2 WEEKS, REPEAT x 2 WEEKS EXTERNALLY 6 WEEKS  0  . ibuprofen (ADVIL,MOTRIN) 200 MG tablet Take 400 mg by mouth every 6 (six) hours as needed for moderate pain.    . metFORMIN (GLUCOPHAGE) 500 MG tablet Take 1 tablet (500 mg total) by mouth 2 (two) times daily with a meal. 60 tablet 3  . metoprolol tartrate (LOPRESSOR) 25 MG tablet Take 12.5 mg by mouth 2 (two) times daily.     . Multiple Vitamin (MULTIVITAMIN WITH MINERALS) TABS tablet Take 1 tablet by mouth as directed. TAKES OCCASIONALLY PER PT    . omeprazole (PRILOSEC) 20 MG capsule Take 20 mg by mouth daily.    [name] Kitchen telmisartan-hydrochlorothiazide (MICARDIS HCT) 80-12.5 MG per tablet Take 0.5 tablets by mouth daily.     No current facility-administered medications for this visit.     Past Medical History  Diagnosis Date  . Hypertension   . Diabetes mellitus without complication   . GERD (gastroesophageal reflux disease)   . Gout     ROS:   All systems reviewed and negative except as noted in the  HPI.   Past Surgical History  Procedure Laterality Date  . Squamous cell carcinoma excision    . Basal cell carcinoma excision      off of back     Family History  Problem Relation Age of Onset  . Stroke Mother   . Hypertension Mother   . Alzheimer's disease Father   . Down syndrome Daughter      History   Social History  . Marital Status: Married    Spouse Name: N/A    Number of Children: N/A  . Years of Education: N/A   Occupational History  . Not on file.   Social History Main Topics  . Smoking status: Never Smoker   . Smokeless tobacco: Not on file  . Alcohol Use: No  . Drug Use: No  . Sexual Activity: Not on file   Other Topics Concern  . Not on file   Social History Narrative     BP 116/68 mmHg  Pulse 71  Ht 5\' 9"  (1.753 m)  Wt 183 lb (83.008 kg)  BMI 27.01 kg/m2  Physical Exam:  Well appearing 74 yo man, NAD HEENT: Unremarkable Neck:  No JVD, no thyromegally Back:  No CVA tenderness Lungs:  Clear with no wheezes HEART:  Regular rate rhythm, no murmurs, no rubs, no  clicks Abd:  soft, positive bowel sounds, no organomegally, no rebound, no guarding Ext:  2 plus pulses, no edema, no cyanosis, no clubbing Skin:  No rashes no nodules Neuro:  CN II through XII intact, motor grossly intact  EKG - NSR with no pre-excitation  ECG during SVT - Narrow QRS tachy at 230/min.   Assess/Plan:

## 2015-01-09 NOTE — Assessment & Plan Note (Signed)
His blood pressure is well controlled on medical therapy. Will follow.

## 2015-01-09 NOTE — Assessment & Plan Note (Signed)
He'll continue his low dose statin therapy and maintain a low fat diet.

## 2015-01-17 ENCOUNTER — Telehealth: Payer: Self-pay | Admitting: Internal Medicine

## 2015-01-17 NOTE — Telephone Encounter (Signed)
Please call,concerning his holter monitor. He had some problem with it,had to have first one replaced. He wore them for about 24 or 25 days. He wants to know should he wear it longer because of the days he missed.

## 2015-01-17 NOTE — Telephone Encounter (Signed)
Advised OK to turn in monitor whenever, he may want to call [employer] to see if insurance won't be an issue to deal with if he keeps monitor a few days longer. Other point to consider that we discussed is turning in the monitor in time to having results reviewed for his follow up.  He missed 4-5 days total out of the 30, as he reported he didn't have any noticeable repeat of the SVT in the unmonitored time gap.

## 2015-01-17 NOTE — Telephone Encounter (Signed)
New MEssage  Pt requested to speak w/ Rna bout heart monitor. Pt wanted to ask if one more week of wearing it is necessary or not. Please call back and discuss.

## 2015-01-17 NOTE — Telephone Encounter (Signed)
Discussed with Dr. Lovena Le and he says it's okay to return.  Has worn for 23 days and will return tomorrow or 01/19/15.  I will get the monitor and call the patient once it is done.  I let him know that the results of monitor will not change Dr Tanna Furry recommendation for SVT ablation  c 

## 2015-01-26 NOTE — Telephone Encounter (Signed)
lmom for patient that his monitor was normal  Showed NSR

## 2015-01-30 ENCOUNTER — Emergency Department (HOSPITAL_COMMUNITY): Payer: Medicare Other

## 2015-01-30 ENCOUNTER — Emergency Department (HOSPITAL_COMMUNITY)
Admission: EM | Admit: 2015-01-30 | Discharge: 2015-01-30 | Disposition: A | Discharge status: Home / Self Care | Payer: Medicare Other | Attending: Emergency Medicine | Admitting: Emergency Medicine

## 2015-01-30 ENCOUNTER — Encounter (HOSPITAL_COMMUNITY): Payer: Self-pay | Admitting: Emergency Medicine

## 2015-01-30 DIAGNOSIS — I10 Essential (primary) hypertension: Secondary | ICD-10-CM | POA: Insufficient documentation

## 2015-01-30 DIAGNOSIS — E119 Type 2 diabetes mellitus without complications: Secondary | ICD-10-CM | POA: Diagnosis not present

## 2015-01-30 DIAGNOSIS — Z7982 Long term (current) use of aspirin: Secondary | ICD-10-CM | POA: Diagnosis not present

## 2015-01-30 DIAGNOSIS — Z8739 Personal history of other diseases of the musculoskeletal system and connective tissue: Secondary | ICD-10-CM | POA: Insufficient documentation

## 2015-01-30 DIAGNOSIS — R072 Precordial pain: Secondary | ICD-10-CM

## 2015-01-30 DIAGNOSIS — K219 Gastro-esophageal reflux disease without esophagitis: Secondary | ICD-10-CM | POA: Insufficient documentation

## 2015-01-30 DIAGNOSIS — I2510 Atherosclerotic heart disease of native coronary artery without angina pectoris: Secondary | ICD-10-CM | POA: Insufficient documentation

## 2015-01-30 DIAGNOSIS — J9811 Atelectasis: Secondary | ICD-10-CM | POA: Diagnosis not present

## 2015-01-30 DIAGNOSIS — R079 Chest pain, unspecified: Secondary | ICD-10-CM

## 2015-01-30 DIAGNOSIS — Z79899 Other long term (current) drug therapy: Secondary | ICD-10-CM | POA: Diagnosis not present

## 2015-01-30 DIAGNOSIS — N2889 Other specified disorders of kidney and ureter: Secondary | ICD-10-CM | POA: Diagnosis not present

## 2015-01-30 LAB — CBC
HCT: 37.8 % — ABNORMAL LOW (ref 39.0–52.0)
Hemoglobin: 12.5 g/dL — ABNORMAL LOW (ref 13.0–17.0)
MCH: 28.6 pg (ref 26.0–34.0)
MCHC: 33.1 g/dL (ref 30.0–36.0)
MCV: 86.5 fL (ref 78.0–100.0)
PLATELETS: 194 10*3/uL (ref 150–400)
RBC: 4.37 MIL/uL (ref 4.22–5.81)
RDW: 16.1 % — ABNORMAL HIGH (ref 11.5–15.5)
WBC: 7.6 10*3/uL (ref 4.0–10.5)

## 2015-01-30 LAB — BASIC METABOLIC PANEL
ANION GAP: 11 (ref 5–15)
BUN: 22 mg/dL (ref 6–23)
CHLORIDE: 103 mmol/L (ref 96–112)
CO2: 25 mmol/L (ref 19–32)
Calcium: 9.2 mg/dL (ref 8.4–10.5)
Creatinine, Ser: 1.17 mg/dL (ref 0.50–1.35)
GFR calc non Af Amer: 60 mL/min — ABNORMAL LOW (ref >90)
GFR, EST AFRICAN AMERICAN: 69 mL/min — AB (ref >90)
GLUCOSE: 139 mg/dL — AB (ref 70–99)
POTASSIUM: 4.3 mmol/L (ref 3.5–5.1)
SODIUM: 139 mmol/L (ref 135–145)

## 2015-01-30 LAB — I-STAT TROPONIN, ED
TROPONIN I, POC: 0 ng/mL (ref 0.00–0.08)
Troponin i, poc: 0.01 ng/mL (ref 0.00–0.08)

## 2015-01-30 MED ORDER — NITROGLYCERIN 0.4 MG SL SUBL
SUBLINGUAL_TABLET | SUBLINGUAL | Status: AC
  Filled 2015-01-30: qty 1

## 2015-01-30 MED ORDER — NITROGLYCERIN 0.4 MG SL SUBL
0.4000 mg | SUBLINGUAL_TABLET | SUBLINGUAL | Status: DC | PRN
  Administered 2015-01-30: 0.4 mg via SUBLINGUAL

## 2015-01-30 MED ORDER — IOHEXOL 350 MG/ML SOLN
80.0000 mL | Freq: Once | INTRAVENOUS | Status: AC | PRN
  Administered 2015-01-30: 80 mL via INTRAVENOUS

## 2015-01-30 MED ORDER — METOPROLOL TARTRATE 1 MG/ML IV SOLN
5.0000 mg | INTRAVENOUS | Status: DC | PRN
  Administered 2015-01-30: 5 mg via INTRAVENOUS
  Filled 2015-01-30: qty 5

## 2015-01-30 MED ORDER — NITROGLYCERIN 0.4 MG SL SUBL
0.4000 mg | SUBLINGUAL_TABLET | SUBLINGUAL | Status: AC | PRN
  Administered 2015-01-30 (×3): 0.4 mg via SUBLINGUAL
  Filled 2015-01-30: qty 1

## 2015-01-30 NOTE — ED Notes (Signed)
Took 6 baby asa at home.

## 2015-01-30 NOTE — ED Notes (Signed)
2D Echo completed. Pt going for U/S at this time.

## 2015-01-30 NOTE — ED Provider Notes (Signed)
CSN: [id number]     Arrival date & time 01/30/15  4098 History   First MD Initiated Contact with Patient 01/30/15 [phone]     Chief Complaint  Patient presents with  . Chest Pain     (Consider location/radiation/quality/duration/timing/severity/associated sxs/prior Treatment) HPI Comments: Patient presents with complaint of aching chest pain with radiation to bilateral shoulders first noticed when patient woke up to use the bathroom at 4:30 AM today. EMS was called. Patient has had a total of 6 baby aspirin prior to arrival. No nitroglycerin. Pain is persistent. Onset acute. Nothing makes symptoms better or worse. Pain is not associated with lightheadedness, nausea/vomiting, palpitations, diaphoresis. No abdominal pain or urinary symptoms.  In 11/2014 patient had pain which he describes as being exactly the same. He was admitted to the hospital. Patient had a treadmill Myoview which was negative, however he had a run of SVT. He has seen electrophysiology for this and had a Holter monitor.  Patient is a 74 y.o. male presenting with chest pain. The history is provided by the patient and medical records.  Chest Pain Associated symptoms: no abdominal pain, no back pain, no cough, no diaphoresis, no fever, no nausea, no palpitations, no shortness of breath and not vomiting     Past Medical History  Diagnosis Date  . Hypertension   . Diabetes mellitus without complication   . GERD (gastroesophageal reflux disease)   . Gout    Past Surgical History  Procedure Laterality Date  . Squamous cell carcinoma excision    . Basal cell carcinoma excision      off of back   Family History  Problem Relation Age of Onset  . Stroke Mother   . Hypertension Mother   . Alzheimer's disease Father   . Down syndrome Daughter    History  Substance Use Topics  . Smoking status: Never Smoker   . Smokeless tobacco: Not on file  . Alcohol Use: No    Review of Systems  Constitutional: Negative for fever  and diaphoresis.  Eyes: Negative for redness.  Respiratory: Negative for cough and shortness of breath.   Cardiovascular: Positive for chest pain. Negative for palpitations and leg swelling.  Gastrointestinal: Negative for nausea, vomiting and abdominal pain.  Genitourinary: Negative for dysuria.  Musculoskeletal: Negative for back pain and neck pain.  Skin: Negative for rash.  Neurological: Negative for syncope and light-headedness.    Allergies  Review of patient's allergies indicates no known allergies.  Home Medications   Prior to Admission medications   Medication Sig Start Date End Date Taking? Authorizing Provider  allopurinol (ZYLOPRIM) 300 MG tablet Take 300 mg by mouth daily.   Yes Historical Provider, MD  aspirin EC 81 MG tablet Take 81 mg by mouth daily.   Yes Historical Provider, MD  atorvastatin (LIPITOR) 10 MG tablet Take 1 tablet (10 mg total) by mouth daily. 12/13/14  Yes Pixie Casino, MD  fluorouracil (EFUDEX) 5 % cream APPLY 1 APPLICATION TO AFFECTED AREA DAILY x 2 WEEKS, STOP x 2 WEEKS, REPEAT x 2 WEEKS EXTERNALLY 6 WEEKS 12/28/14  Yes Historical Provider, MD  ibuprofen (ADVIL,MOTRIN) 200 MG tablet Take 400 mg by mouth every 6 (six) hours as needed for moderate pain.   Yes Historical Provider, MD  metFORMIN (GLUCOPHAGE) 500 MG tablet Take 1 tablet (500 mg total) by mouth 2 (two) times daily with a meal. 12/09/14  Yes Charlynne Cousins, MD  metoprolol tartrate (LOPRESSOR) 25 MG tablet Take 12.5 mg by mouth  2 (two) times daily.    Yes Historical Provider, MD  Multiple Vitamin (MULTIVITAMIN WITH MINERALS) TABS tablet Take 1 tablet by mouth every other day.    Yes Historical Provider, MD  omeprazole (PRILOSEC) 20 MG capsule Take 20 mg by mouth daily.   Yes Historical Provider, MD  telmisartan-hydrochlorothiazide (MICARDIS HCT) 80-12.5 MG per tablet Take 0.5 tablets by mouth daily.   Yes Historical Provider, MD   BP 151/81 mmHg  Pulse 77  Temp(Src) 97.9 F (36.6 C)  (Oral)  Resp 19  Ht 5\' 9"  (1.753 m)  Wt 180 lb (81.647 kg)  BMI 26.57 kg/m2  SpO2 94%   Physical Exam  Constitutional: He appears well-developed and well-nourished.  HENT:  Head: Normocephalic and atraumatic.  Mouth/Throat: Mucous membranes are normal. Mucous membranes are not dry.  Eyes: Conjunctivae are normal.  Neck: Trachea normal and normal range of motion. Neck supple. Normal carotid pulses and no JVD present. No muscular tenderness present. Carotid bruit is not present. No tracheal deviation present.  Cardiovascular: Normal rate, regular rhythm, S1 normal, S2 normal, normal heart sounds and intact distal pulses.  Exam reveals no distant heart sounds and no decreased pulses.   No murmur heard. Pulmonary/Chest: Effort normal and breath sounds normal. No respiratory distress. He has no wheezes. He exhibits no tenderness.  Abdominal: Soft. Normal aorta and bowel sounds are normal. There is no tenderness. There is no rebound and no guarding.  Musculoskeletal: He exhibits no edema.  Neurological: He is alert.  Skin: Skin is warm and dry. He is not diaphoretic. No cyanosis. No pallor.  Psychiatric: He has a normal mood and affect.  Nursing note and vitals reviewed.   ED Course  Procedures (including critical care time) Labs Review Labs Reviewed  CBC - Abnormal; Notable for the following:    Hemoglobin 12.5 (*)    HCT 37.8 (*)    RDW 16.1 (*)    All other components within normal limits  BASIC METABOLIC PANEL - Abnormal; Notable for the following:    Glucose, Bld 139 (*)    GFR calc non Af Amer 60 (*)    GFR calc Af Amer 69 (*)    All other components within normal limits  I-STAT TROPOININ, ED  Randolm Idol, ED    Imaging Review Dg Chest 2 View  01/30/2015   CLINICAL DATA:  Chest pain.  Initial encounter.  EXAM: CHEST  2 VIEW  COMPARISON:  CT 12/08/2014.  Radiographs 12/07/2014.  FINDINGS: The heart size and mediastinal contours are stable. Minimal bibasilar atelectasis  appears unchanged. There is no edema, confluent airspace opacity or pleural effusion. The bones appear unremarkable.  IMPRESSION: Stable minimal bibasilar atelectasis. No acute cardiopulmonary process.   Electronically Signed   By: Camie Patience M.D.   On: 01/30/2015 08:37     EKG Interpretation   Date/Time:  Monday January 30 2015 06:47:21 EST Ventricular Rate:  74 PR Interval:  180 QRS Duration: 82 QT Interval:  371 QTC Calculation: 412 R Axis:   -18 Text Interpretation:  Sinus rhythm Borderline left axis deviation No  significant change since last tracing Confirmed by WARD,  DO, KRISTEN  ([id number]) on 01/30/2015 6:50:07 AM       7:18 AM Patient seen and examined. Work-up initiated. NTG ordered.   Vital signs reviewed and are as follows: BP 151/81 mmHg  Pulse 77  Temp(Src) 97.9 F (36.6 C) (Oral)  Resp 19  Ht 5\' 9"  (1.753 m)  Wt 180 lb (81.647  kg)  BMI 26.57 kg/m2  SpO2 94%  9:37 AM Pt discussed earlier with Dr. Zenia Resides. Cards to consult. Pain is unchanged after NTG. Currently 2/10.    1:09 PM Cardiology has seen, reccs obs admit.   MDM   Final diagnoses:  Chest pain, unspecified chest pain type   Admit.     Carlisle Cater, PA-C 01/30/15 1310  Leota Jacobsen, MD 01/31/15 [phone]

## 2015-01-30 NOTE — H&P (Addendum)
eError

## 2015-01-30 NOTE — ED Notes (Signed)
Patient transported to X-ray 

## 2015-01-30 NOTE — ED Provider Notes (Signed)
Patient seen by another EDP earlier this morning, currently in pod C waiting for evaluation by cardiology. He had a 2-D echocardiogram performed as well as CTA of his chest.  Cardiology has reviewed results, and feels the patient can be discharged home. Please see their consult note for full assessment details. Patient has follow-up with his cardiologist tomorrow.  Larene Pickett, PA-C 01/30/15 [place], PA-C 01/30/15 [place], MD 01/31/15 [phone]

## 2015-01-30 NOTE — ED Notes (Signed)
CT at bedside notified patient now has Lefty AC 18g.

## 2015-01-30 NOTE — ED Notes (Signed)
Another nurse attempted IV unable obtain 18g.

## 2015-01-30 NOTE — ED Notes (Signed)
Phlebotomy at bedside.

## 2015-01-30 NOTE — Discharge Instructions (Signed)
Cardiology feels it is safe for you to go home. Follow-up with Dr. Debara Pickett tomorrow. Return to the ED for new concerns.

## 2015-01-30 NOTE — Consult Note (Signed)
Patient ID: Jomarion Mish MRN: [id number], DOB/AGE: [birth year]   Admit date: 01/30/2015   Primary Physician: Mayra Neer, MD Primary Cardiologist: Dr. Debara Pickett Dr. Lovena Le (EP)  Pt. Profile:  Tobe Kervin is a 74 y.o. male with a history of DM, SVT, HTN, GERD and gout who presented to [facility] today with recurrent chest pain.  He was originally seen during an admission at [facility] 12/07/14- 12/09/14 for chest pain. He presented with chest pain that he developed while sitting his computer that was completely relieved by SL NTG. The symptoms had some typical and atypical features for angina. He ruled out for MI and underwent nuclear stress testing which was negative for ischemia. During the stress test while exercising he developed an SVT with heart rate that shot up abruptly to 220. This responded to vagal maneuvers and then reoccurred and eventually subsided. He was started on low-dose beta blocker and eventually discharged without any recurrent symptoms. He was placed on a monitor and has follow-up with cardiac electrophysiology to see Dr. Lovena Le on 01/09/15. Heart monitor was unrevealing and documented no events. Dr. 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 He has not made a final decision regarding this.   He was in his usual state of health until early this morning. Today he presents with recurrent chest pain, identical to the sx he had prior to his December admission. It is an ache across his entire chest that radiates to both shoulders. 4/10 pain. Worse with deep inspiration. It started around 4am this morning when he woke up to use the bathroom. It was constant and now finally eased off after 3 SL NTG; however, he can still feel discomfort. No associated SOB, diaphoresis, nausea. No exertional chest pain but does report some DOE; however, he is not that active. Father had CABG around his  age. No orthopnea, PND or LE edema.  Problem List  Past Medical History  Diagnosis Date  . Hypertension   . Diabetes mellitus without complication   . GERD (gastroesophageal reflux disease)   . Gout     Past Surgical History  Procedure Laterality Date  . Squamous cell carcinoma excision    . Basal cell carcinoma excision      off of back     Allergies  No Known Allergies   Home Medications  Prior to Admission medications   Medication Sig Start Date End Date Taking? Authorizing Provider  allopurinol (ZYLOPRIM) 300 MG tablet Take 300 mg by mouth daily.   Yes Historical Provider, MD  aspirin EC 81 MG tablet Take 81 mg by mouth daily.   Yes Historical Provider, MD  atorvastatin (LIPITOR) 10 MG tablet Take 1 tablet (10 mg total) by mouth daily. 12/13/14  Yes Pixie Casino, MD  fluorouracil (EFUDEX) 5 % cream APPLY 1 APPLICATION TO AFFECTED AREA DAILY x 2 WEEKS, STOP x 2 WEEKS, REPEAT x 2 WEEKS EXTERNALLY 6 WEEKS 12/28/14  Yes Historical Provider, MD  ibuprofen (ADVIL,MOTRIN) 200 MG tablet Take 400 mg by mouth every 6 (six) hours as needed for moderate pain.   Yes Historical Provider, MD  metFORMIN (GLUCOPHAGE) 500 MG tablet Take 1 tablet (500 mg total) by mouth 2 (two) times daily with a meal. 12/09/14  Yes Charlynne Cousins, MD  metoprolol tartrate (LOPRESSOR) 25 MG tablet Take 12.5 mg by mouth 2 (two) times daily.  Yes Historical Provider, MD  Multiple Vitamin (MULTIVITAMIN WITH MINERALS) TABS tablet Take 1 tablet by mouth every other day.    Yes Historical Provider, MD  omeprazole (PRILOSEC) 20 MG capsule Take 20 mg by mouth daily.   Yes Historical Provider, MD  telmisartan-hydrochlorothiazide (MICARDIS HCT) 80-12.5 MG per tablet Take 0.5 tablets by mouth daily.   Yes Historical Provider, MD    Family History  Family History  Problem Relation Age of Onset  .  Stroke Mother   . Hypertension Mother   . Alzheimer's disease Father   . Down syndrome Daughter   . Heart attack Father    Family Status  Relation Status Death Age  . Mother Deceased   . Father Deceased   . Sister Alive   . Daughter Alive      Social History  History   Social History  . Marital Status: Married    Spouse Name: N/A    Number of Children: N/A  . Years of Education: N/A   Occupational History  . Not on file.   Social History Main Topics  . Smoking status: Never Smoker   . Smokeless tobacco: Not on file  . Alcohol Use: No  . Drug Use: No  . Sexual Activity: Not on file   Other Topics Concern  . Not on file   Social History Narrative     All other systems reviewed and are otherwise negative except as noted above.  Physical Exam  Blood pressure 130/78, pulse 77, temperature 97.9 F (36.6 C), temperature source Oral, resp. rate 20, height 5\' 9"  (1.753 m), weight 180 lb (81.647 kg), SpO2 96 %.  General: Pleasant, NAD Psych: Normal affect. Neuro: Alert and oriented X 3. Moves all extremities spontaneously. HEENT: Normal Neck: Supple without bruits or JVD. Lungs: Resp regular and unlabored, CTA. Heart: RRR no s3, s4, or murmurs. Abdomen: Soft, non-tender, non-distended, BS + x 4.  Extremities: No clubbing, cyanosis or edema. DP/PT/Radials 2+ and equal bilaterally.  Labs   Recent Labs (last 2 labs)     No results for input(s): CKTOTAL, CKMB, TROPONINI in the last 72 hours.    Recent Labs    Lab Results  Component Value Date   WBC 7.6 01/30/2015   HGB 12.5* 01/30/2015   HCT 37.8* 01/30/2015   MCV 86.5 01/30/2015   PLT 194 01/30/2015       Last Labs      Recent Labs Lab 01/30/15 0740  NA 139  K 4.3  CL 103  CO2 25  BUN 22  CREATININE 1.17  CALCIUM 9.2  GLUCOSE 139*      Recent Labs     Lab Results  Component Value Date   CHOL 154 12/08/2014   HDL 36* 12/08/2014   LDLCALC 95 12/08/2014   TRIG 115 12/08/2014       Radiology/Studies   Imaging Results    Dg Chest 2 View  01/30/2015 CLINICAL DATA: Chest pain. Initial encounter. EXAM: CHEST 2 VIEW COMPARISON: CT 12/08/2014. Radiographs 12/07/2014. FINDINGS: The heart size and mediastinal contours are stable. Minimal bibasilar atelectasis appears unchanged. There is no edema, confluent airspace opacity or pleural effusion. The bones appear unremarkable. IMPRESSION: Stable minimal bibasilar atelectasis. No acute cardiopulmonary process. Electronically Signed By: Camie Patience M.D. On: 01/30/2015 08:37     Study Date: 01/30/2015 LV EF: 65% -  70% Study Conclusions - Left ventricle: The cavity size was normal. Systolic function was vigorous. The estimated ejection fraction was in the range  of 65% to 70%. Wall motion was normal; there were no regional wall motion abnormalities. Doppler parameters are consistent with abnormal left ventricular relaxation (grade 1 diastolic dysfunction). - Aortic valve: There was trivial regurgitation. - Mitral valve: Calcified annulus. - Left atrium: The atrium was mildly dilated. - Pulmonic valve: There was moderate regurgitation. - Pulmonary arteries: Systolic pressure was mildly increased. PA peak pressure: 37 mm Hg (S). - Pericardium, extracardiac: A trivial pericardial effusion was identified along the right ventricular free wall and along the right atrial free wall. There was no evidence of hemodynamic compromise.   ECG  HR 74 NSR  ASSESSMENT AND PLAN  Ovie Cornelio is a 74 y.o. male with a history of DM, SVT, HTN, GERD and gout who presented to [facility] today with recurrent chest pain.  Chest pain --Troponin x1 negative, ECG with no acute ST or TW changes -- Cycle Troponin and serial ECGs -- Stat 2D ECHO with EF 65-70%,  G1DD, no RWMA. Mild LA dilation, mod PR, PA pk pressure 37, trivial extracardiac pericardial effusion  -- Stat RUQ [place] negative for acute cholecystitis. Will proceed with cardiac CT. If negative, he may be able to be discharged home.   SVT- no reoccurrences by 30 day event monitor. Currently in NSR  -- Saw Dr. 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   DM- SSI. Hold metformin in case he need a LHC  GERD- continue PPI    Signed, Eileen Stanford, PA-C 01/30/2015, 1:22 PM  Pager [phone]  Patient seen and examined Agree with findings of K [place] Silos above Pt is a 4 yo with CP Diffierent from reflux (this goes across chest, reflux goes up/down) Had similar symptoms in December Myoview neg (though devel SVT), CT r/o PE was neg . CP is +/- pleuriitic Not now Still with residual discomfort. EKG without acute changes.  Echo done LVEF 65 to 70% with trivial pericardial effusion I do not think this represents pericarditis clinically   RUQ USN ordered Negative for GB disease (renal cyst noted Will require CT to eval)  Have sched a coronary CT angio to define If neg would aggressively Rx for reflux    Dorris Carnes   Addendum  I have reviewed CT angiogram with Dr Liane Comber  Patient with CAD but there does not appear to be any flow limiting lesions (See report) I would recomm:  1.  Increasing omeprazole to bid.  2.  Increase lipitor to 40 mg per day  F/U as outpatient  Patient already has appt with C Hilty tomorrow  I would suggest keeping this appt. To discuss further care/eval.  OK to d/c home.    Dorris Carnes

## 2015-01-30 NOTE — ED Notes (Signed)
Spoke with CT transporting patient on cardiac monitor by nurse to CT 1.

## 2015-01-30 NOTE — Progress Notes (Signed)
  Echocardiogram 2D Echocardiogram has been performed.  Donata Clay 01/30/2015, 2:41 PM

## 2015-01-30 NOTE — ED Notes (Signed)
Having cp in center of chest since 5 am.  Describes as aching.  Had the same in December.  Cath done no blockages found.  Wore halter monitor for a month and found nothing.  Has appt with cardiology for tomoroow.

## 2015-01-31 ENCOUNTER — Ambulatory Visit (INDEPENDENT_AMBULATORY_CARE_PROVIDER_SITE_OTHER): Payer: Medicare Other | Admitting: Internal Medicine

## 2015-01-31 ENCOUNTER — Encounter: Payer: Self-pay | Admitting: Internal Medicine

## 2015-01-31 VITALS — BP 102/60 | HR 68 | Ht 69.0 in | Wt 181.6 lb

## 2015-01-31 DIAGNOSIS — K219 Gastro-esophageal reflux disease without esophagitis: Secondary | ICD-10-CM | POA: Diagnosis not present

## 2015-01-31 DIAGNOSIS — I10 Essential (primary) hypertension: Secondary | ICD-10-CM | POA: Diagnosis not present

## 2015-01-31 DIAGNOSIS — R0789 Other chest pain: Secondary | ICD-10-CM | POA: Diagnosis not present

## 2015-01-31 DIAGNOSIS — E119 Type 2 diabetes mellitus without complications: Secondary | ICD-10-CM | POA: Diagnosis not present

## 2015-01-31 DIAGNOSIS — E785 Hyperlipidemia, unspecified: Secondary | ICD-10-CM

## 2015-01-31 DIAGNOSIS — I471 Supraventricular tachycardia: Secondary | ICD-10-CM | POA: Diagnosis not present

## 2015-01-31 MED ORDER — TELMISARTAN-HCTZ 80-12.5 MG PO TABS: 0.5000 | ORAL_TABLET | Freq: Every day | ORAL | Status: DC

## 2015-01-31 MED ORDER — ATORVASTATIN CALCIUM 20 MG PO TABS: 20.0000 mg | ORAL_TABLET | Freq: Every day | ORAL | Status: DC

## 2015-01-31 MED ORDER — METOPROLOL TARTRATE 25 MG PO TABS: 12.5000 mg | ORAL_TABLET | Freq: Two times a day (BID) | ORAL | Status: DC

## 2015-01-31 MED ORDER — NITROGLYCERIN 0.4 MG SL SUBL: 0.4000 mg | SUBLINGUAL_TABLET | SUBLINGUAL | Status: DC | PRN

## 2015-01-31 NOTE — Patient Instructions (Signed)
Your physician has recommended you make the following change in your medication: INCREASE atorvastatin to 20mg  daily   Dr. Debara Pickett has prescribed NITROGLYCERIN to use as needed for chest pain  >> dissolve 1 tablet under tongue every 5 minutes as needed for chest pain - MAX 3 doses   Your physician recommends that you schedule a follow-up appointment in: 3 months with Dr. Debara Pickett (30 minute appointment)

## 2015-02-01 ENCOUNTER — Telehealth: Payer: Self-pay | Admitting: Physician Assistant

## 2015-02-01 NOTE — Progress Notes (Signed)
OFFICE NOTE  Chief Complaint:  Hospital follow-up  Primary Care Physician: Mayra Neer, MD  HPI:  Justin [name] is a pleasant 74 year old male who I met recently in the hospital. He presented with chest pain that he developed while sitting his computer. The symptoms had some typical and atypical features for angina. He ruled out for MI and underwent nuclear stress testing which was negative for ischemia. During the stress test while exercising he developed an SVT with heart rate that shot up abruptly to 220. This responded to vagal maneuvers and then reoccurred and eventually subsided. He was started on low-dose beta blocker and eventually discharged without any recurrent symptoms. He was placed on a monitor and has follow-up with cardiac electrophysiology to see Dr. Lovena Le in January. 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 He was started on Lipitor which was started the hospital for for coronary disease, however his LDL is only 95. He is a diabetic. His metformin was increased to 500 twice a day which is improved his blood sugars. He recently saw his primary care provider felt that he was doing well.  I saw Justin [name] back in the office today. He was referred to Dr. Cristopher Peru for SVT ablation consideration, and this was offered to him. Prior to having this scheduled he developed an episode of chest pain again and that brought him to the hospital yesterday. He was evaluated by Dr. Dorris Carnes, and had a right upper quadrant ultrasound which was unremarkable. He also had coronary CT angiogram which demonstrated mild to moderate coronary disease and an intermediate coronary calcium score of 202. He has subsequently worn a monitor between 12/21/2015 and 01/18/2015 which showed no recurrent SVT and normal sinus rhythm.  The etiology of his pain episodes is unclear however may be  related to reflux.   PMHx:  Past Medical History  Diagnosis Date  . Hypertension   . Diabetes mellitus without complication   . GERD (gastroesophageal reflux disease)   . Gout     Past Surgical History  Procedure Laterality Date  . Squamous cell carcinoma excision    . Basal cell carcinoma excision      off of back    FAMHx:  Family History  Problem Relation Age of Onset  . Stroke Mother   . Hypertension Mother   . Alzheimer's disease Father   . Down syndrome Daughter   . Heart attack Father     SOCHx:   reports that he has never smoked. He does not have any smokeless tobacco history on file. He reports that he does not drink alcohol or use illicit drugs.  ALLERGIES:  No Known Allergies  ROS: A comprehensive review of systems was negative except for: Cardiovascular: positive for chest pressure/discomfort  HOME MEDS: Current Outpatient Prescriptions  Medication Sig Dispense Refill  . allopurinol (ZYLOPRIM) 300 MG tablet Take 300 mg by mouth daily.    [name] Kitchen aspirin EC 81 MG tablet Take 81 mg by mouth daily.    [name] Kitchen atorvastatin (LIPITOR) 20 MG tablet Take 1 tablet (20 mg total) by mouth daily. 90 tablet 3  . fluorouracil (EFUDEX) 5 % cream APPLY 1 APPLICATION TO AFFECTED AREA DAILY x 2 WEEKS, STOP x 2 WEEKS, REPEAT x 2 WEEKS EXTERNALLY 6 WEEKS  0  . ibuprofen (ADVIL,MOTRIN) 200 MG tablet Take 400 mg by mouth every  6 (six) hours as needed for moderate pain.    . metFORMIN (GLUCOPHAGE) 500 MG tablet Take 1 tablet (500 mg total) by mouth 2 (two) times daily with a meal. 60 tablet 3  . metoprolol tartrate (LOPRESSOR) 25 MG tablet Take 0.5 tablets (12.5 mg total) by mouth 2 (two) times daily. 90 tablet 3  . Multiple Vitamin (MULTIVITAMIN WITH MINERALS) TABS tablet Take 1 tablet by mouth every other day.     . nitroGLYCERIN (NITROSTAT) 0.4 MG SL tablet Place 1 tablet (0.4 mg total) under the tongue every 5 (five) minutes as needed for chest pain. 25 tablet 3  . omeprazole (PRILOSEC)  20 MG capsule Take 20 mg by mouth 2 (two) times daily.     [name] Kitchen telmisartan-hydrochlorothiazide (MICARDIS HCT) 80-12.5 MG per tablet Take 0.5 tablets by mouth daily. 90 tablet 3   No current facility-administered medications for this visit.    LABS/IMAGING: No results found for this or any previous visit (from the past 48 hour(s)). No results found.  VITALS: BP 102/60 mmHg  Pulse 68  Ht 5' 9"  (1.753 m)  Wt 181 lb 9.6 oz (82.373 kg)  BMI 26.81 kg/m2  EXAM: General appearance: alert and no distress Neck: no carotid bruit and no JVD Lungs: clear to auscultation bilaterally Heart: regular rate and rhythm, S1, S2 normal, no murmur, click, rub or gallop Abdomen: soft, non-tender; bowel sounds normal; no masses,  no organomegaly Extremities: extremities normal, atraumatic, no cyanosis or edema Pulses: 2+ and symmetric Skin: Skin color, texture, turgor normal. No rashes or lesions Neurologic: Grossly normal  EKG: Deferred  ASSESSMENT: 1. Recurrent chest pain with a negative nuclear stress test - also, a coronary CT angiogram showed mild to moderate coronary disease which was nonobstructive and an intermediate coronary calcium score of 202. 2. PSVT - plans for ablation per Dr. Lovena Le 3. Diabetes type 2 4. Relative dyslipidemia  PLAN: 1.   Justin [name] had another episode of chest discomfort with no clear etiology. He is undergone a coronary CT angiogram which is almost an equivalent to heart catheterization. This showed some mild to possibly moderate calcific coronary artery disease without obstruction. He did have an elevated coronary calcium score of 202, which is intermediate and somewhat commensurate with his age. Given the fact that he had a negative functional nuclear stress test without obstructive coronary disease based on his CT, I think we can effectively rule out these episodes as being noncardiac. He did have the solitary episode of PSVT with exertion. 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 Lovena Le. His symptoms may be related to reflux and I've asked him to double up his PPI for the next several weeks to see if his symptoms improve. Based on the fact that he has known coronary artery disease, I recommended increasing his Lipitor to 20 mg daily as his most recent LDL was 95, therefore to reach a goal of LDL less than 70. Finally, I provided him with some nitroglycerin for him to use as needed if he should have other episodes.  Plan to see him back in a few months after his visit with Dr. Lovena Le.  Pixie Casino, MD, [facility] Attending Cardiologist [facility]  HILTY,Kenneth C 02/01/2015, 7:18 PM

## 2015-02-01 NOTE — Telephone Encounter (Signed)
I was made a note to myself to f/u on the extracardiac CT findings on his coronary CT since these typically come back later than the coronary findings. There were no significant non-cardiac abnormalities seen in visualized portion of the thorax. I was not directly involved in his care, but the report was called to me.  Upon review of chart I also note his abd US showed "Complex cystic appearance of right kidney is noted and further evaluation with CT scan of the abdomen with and without intravenous contrast is recommended." This was also present on CT chest 11/2014. From notes it's not clear to me if the patient was made aware of this finding during his ER viist. Please ask him to f/u PCP to discuss possible further imaging of his kidney to find out what this shadow represents. Dayna Dunn PA-C

## 2015-02-02 NOTE — Telephone Encounter (Signed)
I spoke with Jocelyn Lamer at Dr. Illene Regulus office.  She will send message to Dr. Brigitte Pulse & her nurse about current US Gallbladder results with "complex cystic appearance of right kidney  And further evaluation with CT scan" as per Dayna Dunn's phone note  Horton Chin RN

## 2015-02-22 ENCOUNTER — Other Ambulatory Visit: Payer: Self-pay | Admitting: Family Medicine

## 2015-02-22 DIAGNOSIS — N281 Cyst of kidney, acquired: Secondary | ICD-10-CM

## 2015-03-01 ENCOUNTER — Ambulatory Visit
Admission: RE | Admit: 2015-03-01 | Discharge: 2015-03-01 | Disposition: A | Discharge status: Home / Self Care | Payer: Medicare Other | Source: Ambulatory Visit | Attending: Family Medicine | Admitting: Family Medicine

## 2015-03-01 DIAGNOSIS — N281 Cyst of kidney, acquired: Secondary | ICD-10-CM

## 2015-03-01 MED ORDER — IOPAMIDOL (ISOVUE-300) INJECTION 61%
100.0000 mL | Freq: Once | INTRAVENOUS | Status: AC | PRN
  Administered 2015-03-01: 100 mL via INTRAVENOUS

## 2015-03-21 ENCOUNTER — Telehealth: Payer: Self-pay | Admitting: Internal Medicine

## 2015-03-21 NOTE — Telephone Encounter (Signed)
New message      Calling to schedule an ablation.

## 2015-03-21 NOTE — Telephone Encounter (Signed)
Pt wanted Dr Debara Pickett to know he is trying to get Dr Lovena Le to do his ablation.

## 2015-03-24 NOTE — Telephone Encounter (Signed)
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 Lovena Le & Claiborne Billings will be in the office early next week & return call to pt  Horton Chin RN

## 2015-03-24 NOTE — Telephone Encounter (Signed)
Follow Up  Pt has not heard from our office about possible ablation w/ Dr. Lovena Le. Please call back and discuss.

## 2015-03-28 NOTE — Telephone Encounter (Signed)
Discussed with Dr. Taylor

## 2015-03-29 DIAGNOSIS — L570 Actinic keratosis: Secondary | ICD-10-CM | POA: Diagnosis not present

## 2015-03-29 DIAGNOSIS — L821 Other seborrheic keratosis: Secondary | ICD-10-CM | POA: Diagnosis not present

## 2015-04-06 ENCOUNTER — Encounter: Payer: Self-pay | Admitting: *Deleted

## 2015-04-06 NOTE — Telephone Encounter (Signed)
Reviewed SVT ablations instructions with patient and left letter of instructions at front desk for his pick up. Pre procedure labs on5/2/16

## 2015-04-24 ENCOUNTER — Other Ambulatory Visit: Payer: Self-pay | Admitting: *Deleted

## 2015-04-24 ENCOUNTER — Other Ambulatory Visit (INDEPENDENT_AMBULATORY_CARE_PROVIDER_SITE_OTHER): Payer: Medicare Other | Admitting: *Deleted

## 2015-04-24 DIAGNOSIS — I10 Essential (primary) hypertension: Secondary | ICD-10-CM

## 2015-04-24 DIAGNOSIS — R0789 Other chest pain: Secondary | ICD-10-CM | POA: Diagnosis not present

## 2015-04-24 DIAGNOSIS — E785 Hyperlipidemia, unspecified: Secondary | ICD-10-CM

## 2015-04-24 DIAGNOSIS — I471 Supraventricular tachycardia: Secondary | ICD-10-CM | POA: Diagnosis not present

## 2015-04-24 LAB — BASIC METABOLIC PANEL
BUN: 17 mg/dL (ref 6–23)
CALCIUM: 9 mg/dL (ref 8.4–10.5)
CHLORIDE: 101 meq/L (ref 96–112)
CO2: 25 meq/L (ref 19–32)
Creatinine, Ser: 0.98 mg/dL (ref 0.40–1.50)
GFR: 79.21 mL/min (ref >60.00)
Glucose, Bld: 131 mg/dL — ABNORMAL HIGH (ref 70–99)
Potassium: 3.9 mEq/L (ref 3.5–5.1)
Sodium: 133 mEq/L — ABNORMAL LOW (ref 135–145)

## 2015-04-24 LAB — CBC WITH DIFFERENTIAL/PLATELET
Basophils Absolute: 0 10*3/uL (ref 0.0–0.1)
Basophils Relative: 0.4 % (ref 0.0–3.0)
EOS ABS: 0.1 10*3/uL (ref 0.0–0.7)
EOS PCT: 0.8 % (ref 0.0–5.0)
HCT: 33.2 % — ABNORMAL LOW (ref 39.0–52.0)
HEMOGLOBIN: 11.1 g/dL — AB (ref 13.0–17.0)
Lymphocytes Relative: 10.3 % — ABNORMAL LOW (ref 12.0–46.0)
Lymphs Abs: 0.7 10*3/uL (ref 0.7–4.0)
MCHC: 33.3 g/dL (ref 30.0–36.0)
MCV: 84.5 fl (ref 78.0–100.0)
Monocytes Absolute: 0.3 10*3/uL (ref 0.1–1.0)
Monocytes Relative: 3.9 % (ref 3.0–12.0)
NEUTROS ABS: 6.1 10*3/uL (ref 1.4–7.7)
Neutrophils Relative %: 84.6 % — ABNORMAL HIGH (ref 43.0–77.0)
Platelets: 168 10*3/uL (ref 150.0–400.0)
RBC: 3.93 Mil/uL — ABNORMAL LOW (ref 4.22–5.81)
RDW: 19.3 % — ABNORMAL HIGH (ref 11.5–15.5)
WBC: 7.2 10*3/uL (ref 4.0–10.5)

## 2015-04-24 LAB — PROTIME-INR
INR: 1.1 ratio — ABNORMAL HIGH (ref 0.8–1.0)
Prothrombin Time: 12.1 s (ref 9.6–13.1)

## 2015-04-24 NOTE — Addendum Note (Signed)
Addended by: Eulis Foster on: 04/24/2015 11:09 AM   Modules accepted: Orders

## 2015-04-24 NOTE — Addendum Note (Signed)
Addended by: Eulis Foster on: 04/24/2015 11:08 AM   Modules accepted: Orders

## 2015-05-01 ENCOUNTER — Ambulatory Visit (HOSPITAL_COMMUNITY)
Admission: RE | Admit: 2015-05-01 | Discharge: 2015-05-01 | Disposition: A | Discharge status: Home / Self Care | Payer: Medicare Other | Source: Ambulatory Visit | Attending: Internal Medicine | Admitting: Internal Medicine

## 2015-05-01 ENCOUNTER — Ambulatory Visit (HOSPITAL_COMMUNITY): Admission: RE | Admit: 2015-05-01 | Payer: Medicare Other | Source: Ambulatory Visit | Admitting: Internal Medicine

## 2015-05-01 ENCOUNTER — Encounter (HOSPITAL_COMMUNITY)
Admission: RE | Disposition: A | Discharge status: Home / Self Care | Payer: Medicare Other | Source: Ambulatory Visit | Attending: Internal Medicine

## 2015-05-01 ENCOUNTER — Encounter (HOSPITAL_COMMUNITY): Payer: Self-pay | Admitting: Internal Medicine

## 2015-05-01 ENCOUNTER — Encounter (HOSPITAL_COMMUNITY): Admission: RE | Payer: Self-pay | Source: Ambulatory Visit

## 2015-05-01 DIAGNOSIS — M109 Gout, unspecified: Secondary | ICD-10-CM | POA: Diagnosis not present

## 2015-05-01 DIAGNOSIS — Z8679 Personal history of other diseases of the circulatory system: Secondary | ICD-10-CM | POA: Diagnosis present

## 2015-05-01 DIAGNOSIS — E785 Hyperlipidemia, unspecified: Secondary | ICD-10-CM | POA: Insufficient documentation

## 2015-05-01 DIAGNOSIS — E119 Type 2 diabetes mellitus without complications: Secondary | ICD-10-CM | POA: Insufficient documentation

## 2015-05-01 DIAGNOSIS — Z85828 Personal history of other malignant neoplasm of skin: Secondary | ICD-10-CM | POA: Diagnosis not present

## 2015-05-01 DIAGNOSIS — K219 Gastro-esophageal reflux disease without esophagitis: Secondary | ICD-10-CM | POA: Insufficient documentation

## 2015-05-01 DIAGNOSIS — Z79899 Other long term (current) drug therapy: Secondary | ICD-10-CM | POA: Insufficient documentation

## 2015-05-01 DIAGNOSIS — Z9889 Other specified postprocedural states: Secondary | ICD-10-CM

## 2015-05-01 DIAGNOSIS — I471 Supraventricular tachycardia: Secondary | ICD-10-CM | POA: Diagnosis not present

## 2015-05-01 DIAGNOSIS — Z7982 Long term (current) use of aspirin: Secondary | ICD-10-CM | POA: Diagnosis not present

## 2015-05-01 DIAGNOSIS — I10 Essential (primary) hypertension: Secondary | ICD-10-CM | POA: Insufficient documentation

## 2015-05-01 DIAGNOSIS — E118 Type 2 diabetes mellitus with unspecified complications: Secondary | ICD-10-CM

## 2015-05-01 HISTORY — DX: Supraventricular tachycardia, unspecified: I47.10

## 2015-05-01 HISTORY — DX: Supraventricular tachycardia: I47.1

## 2015-05-01 HISTORY — PX: ELECTROPHYSIOLOGIC STUDY: SHX172A

## 2015-05-01 LAB — GLUCOSE, CAPILLARY
GLUCOSE-CAPILLARY: 118 mg/dL — AB (ref 70–99)
Glucose-Capillary: 117 mg/dL — ABNORMAL HIGH (ref 70–99)
Glucose-Capillary: 122 mg/dL — ABNORMAL HIGH (ref 70–99)

## 2015-05-01 SURGERY — A-FLUTTER/A-TACH/SVT ABLATION
Anesthesia: Monitor Anesthesia Care

## 2015-05-01 SURGERY — SUPRAVENTRICULAR TACHYCARDIA ABLATION
Anesthesia: LOCAL

## 2015-05-01 MED ORDER — ISOPROTERENOL HCL 0.2 MG/ML IJ SOLN
INTRAMUSCULAR | Status: AC
  Filled 2015-05-01: qty 5

## 2015-05-01 MED ORDER — MIDAZOLAM HCL 5 MG/5ML IJ SOLN
INTRAMUSCULAR | Status: AC
  Filled 2015-05-01: qty 5

## 2015-05-01 MED ORDER — METOPROLOL TARTRATE 12.5 MG HALF TABLET: 12.5000 mg | ORAL_TABLET | Freq: Two times a day (BID) | ORAL | Status: DC

## 2015-05-01 MED ORDER — FENTANYL CITRATE (PF) 100 MCG/2ML IJ SOLN
INTRAMUSCULAR | Status: AC
  Filled 2015-05-01: qty 2

## 2015-05-01 MED ORDER — BUPIVACAINE HCL (PF) 0.25 % IJ SOLN
INTRAMUSCULAR | Status: AC
  Filled 2015-05-01: qty 30

## 2015-05-01 MED ORDER — ATORVASTATIN CALCIUM 20 MG PO TABS: 20.0000 mg | ORAL_TABLET | Freq: Every day | ORAL | Status: DC

## 2015-05-01 MED ORDER — NITROGLYCERIN 0.4 MG SL SUBL: 0.4000 mg | SUBLINGUAL_TABLET | SUBLINGUAL | Status: DC | PRN

## 2015-05-01 MED ORDER — SODIUM CHLORIDE 0.9 % IV SOLN
1.0000 mg | INTRAVENOUS | Status: DC | PRN
  Administered 2015-05-01: 4 ug/min via INTRAVENOUS

## 2015-05-01 MED ORDER — FENTANYL CITRATE (PF) 100 MCG/2ML IJ SOLN
INTRAMUSCULAR | Status: DC | PRN
  Administered 2015-05-01 (×2): 12.5 ug via INTRAVENOUS
  Administered 2015-05-01: 25 ug via INTRAVENOUS
  Administered 2015-05-01 (×4): 12.5 ug via INTRAVENOUS

## 2015-05-01 MED ORDER — ASPIRIN EC 81 MG PO TBEC: 81.0000 mg | DELAYED_RELEASE_TABLET | Freq: Every day | ORAL | Status: DC

## 2015-05-01 MED ORDER — PANTOPRAZOLE SODIUM 40 MG PO TBEC: 40.0000 mg | DELAYED_RELEASE_TABLET | Freq: Every day | ORAL | Status: DC

## 2015-05-01 MED ORDER — SODIUM CHLORIDE 0.9 % IJ SOLN: 3.0000 mL | INTRAMUSCULAR | Status: DC | PRN

## 2015-05-01 MED ORDER — ADULT MULTIVITAMIN W/MINERALS CH: 1.0000 | ORAL_TABLET | ORAL | Status: DC

## 2015-05-01 MED ORDER — ALLOPURINOL 300 MG PO TABS: 300.0000 mg | ORAL_TABLET | Freq: Every day | ORAL | Status: DC

## 2015-05-01 MED ORDER — HEPARIN (PORCINE) IN NACL 2-0.9 UNIT/ML-% IJ SOLN
INTRAMUSCULAR | Status: AC
  Filled 2015-05-01: qty 500

## 2015-05-01 MED ORDER — METFORMIN HCL 500 MG PO TABS: 500.0000 mg | ORAL_TABLET | Freq: Two times a day (BID) | ORAL | Status: DC

## 2015-05-01 MED ORDER — MIDAZOLAM HCL 5 MG/5ML IJ SOLN
INTRAMUSCULAR | Status: DC | PRN
  Administered 2015-05-01 (×8): 1 mg via INTRAVENOUS

## 2015-05-01 MED ORDER — SODIUM CHLORIDE 0.9 % IJ SOLN: 3.0000 mL | Freq: Two times a day (BID) | INTRAMUSCULAR | Status: DC

## 2015-05-01 MED ORDER — ONDANSETRON HCL 4 MG/2ML IJ SOLN: 4.0000 mg | Freq: Four times a day (QID) | INTRAMUSCULAR | Status: DC | PRN

## 2015-05-01 MED ORDER — ACETAMINOPHEN 325 MG PO TABS: 650.0000 mg | ORAL_TABLET | ORAL | Status: DC | PRN

## 2015-05-01 MED ORDER — IBUPROFEN 200 MG PO TABS: 400.0000 mg | ORAL_TABLET | Freq: Four times a day (QID) | ORAL | Status: DC | PRN

## 2015-05-01 MED ORDER — SODIUM CHLORIDE 0.9 % IV SOLN: 250.0000 mL | INTRAVENOUS | Status: DC | PRN

## 2015-05-01 SURGICAL SUPPLY — 13 items
BAG SNAP BAND KOVER 36X36 (MISCELLANEOUS) ×2 IMPLANT
CATH CELSIUS THERM D CV 7F (ABLATOR) ×2 IMPLANT
CATH HEX JOSEPH 2-5-2 65CM 6F (CATHETERS) ×2 IMPLANT
CATH JOSEPHSON QUAD-ALLRED 6FR (CATHETERS) ×4 IMPLANT
INTRODUCER SWARTZ SRO 8F (SHEATH) ×2 IMPLANT
PACK EP LATEX FREE (CUSTOM PROCEDURE TRAY) ×1
PACK EP LF (CUSTOM PROCEDURE TRAY) ×1 IMPLANT
PAD DEFIB LIFELINK (PAD) ×2 IMPLANT
SHEATH PINNACLE 6F 10CM (SHEATH) ×4 IMPLANT
SHEATH PINNACLE 7F 10CM (SHEATH) ×2 IMPLANT
SHEATH PINNACLE 8F 10CM (SHEATH) ×2 IMPLANT
SHEATH PINNACLE 9F 10CM (SHEATH) ×2 IMPLANT
SHIELD RADPAD SCOOP 12X17 (MISCELLANEOUS) ×2 IMPLANT

## 2015-05-01 NOTE — CV Procedure (Signed)
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 Comprehensive EP study.  2. Coronary sinus pacing and recording.  3. Mapping of supraventricular tachycardia.  4. Radiofrequency ablation of supraventricular tachycardia.  5. Arrhythmia induction with isuprel infused   INTRODUCTION: Justin [name] is a 74 y.o. male with a history of symptomatic recurrent short RP SVT who presents today for EP study and radiofrequency ablation. The patient has had recurrent symptomatic SVT. She has failed medical therapy with verapamil. She now presents for EP study and radiofrequency ablation of SVT.   DESCRIPTION OF PROCEDURE: Informed written consent was obtained and the patient was brought to the Electrophysiology Lab in the fasting state. The patient was adequately sedated with intravenous medication as outlined in the anesthesia report. The patient's right neck and groin was prepped and draped in the usual sterile fashion by the EP Lab staff. Using a percutaneous Seldinger technique, a 6 F hemostasis sheath was placed into the right internal jugular vein. A 6 F curved hexapolar catheter was advised through the RIJ into the coronary sinus for pacing and recording. Two 6-French and one 8-French hemostasis sheaths were placed  into the right common femoral vein. Two 6-French quadripolar Josephson catheters were introduced through the right common femoral vein and advanced into the His bundle and right ventricular apex positions for recording and pacing.   Presenting Measurements: The patient presented to the Electrophysiology Lab in sinus rhythm. The PR interval was 174 msec with a QRS of 77 msec and a Qt of 404 msec. The average RR interval was 1084 msec. The AH interval was 77 msec and the HV interval was 42 milliseconds.   EP study:  Ventricular pacing was performed which reveals midline concentric decremental VA  conduction with a single retrograde jump but no echo beats of tachycardias. The VA WCL was 360 msec and a VERP 500/323msec.  Rapid atrial pacing was performed with reveals PR >> RR with tachycardia not induced at 440 msec. AEST was performed which revealed multiple prolonged AH jumps with echo beats. Tachycardia was not induced. The AVNERP was 500/239msec.  Isuprel was infused at 1-43mcg/min with an adequate acceleration in HR response observed. PR remained >> RR. Tachycardia was not induced. AEST was performed which again revealed multiple AH jumps. Rapid atrial pacing was carried out down to 260 ms resulting in the induction of SVT at 265 ms. SVT would last approx. 10-20 seconds and stop spontaneously. The VA was 0 and midline atrial activation was present.   This was a reproducible event. V pacing could not be performed during tachycardia because the tachycardia would stop before pacing could commence. PVC's could not be placed for the same reason.  The patient was felt to have classic AV nodal reentrant tachycardia. I therefore elected to perform slow pathway modification.  Isuprel was discontinued and allowed to washout.   Ablation:  A 65F quadrapolar 71mm ablation catheter was therefore advanced through the right femoral vein and advanced into the right atrium. Mapping of Koch's triangle was performed which revealed a small sized Koch's triangle. Multiple RF energy applications were delivered and retrograde block was observed and RF was stopped. After 13 RF's were delivered over 3.8 min., additional pacing resulted in no SVT and the PR was less than the RR. Koch's triangle was very small.  Measurements following ablation:  Following ablation, Rapid atrial pacing was again performed with PR<RR and an AV WCL of  350 msec. AEST was performed which revealed a single AH jump but no echo beats and no tachycardia observed. The AVN ERP was 500/370 msec. V pacing was performed which revealed midline concentric  decremental VA conduction with a single retrograde jump but no echo beats of tachycardias. The VA WCL was 350 msec and a VERP 500/233msec. There was a single retrograde jump with echo beat but no arrhythmias induced.  AEST was performed which revealed no AH jumps, echo beats, or tachycardias induced.  No arrhythmias were induced. Following ablation the AH interval was 59 msec with an HV interval of 43 msec. The procedure was therefore considered completed. All catheters were removed and the sheaths were aspirated and flushed. The sheaths were removed and hemostasis was assured. EBL<16ml. There were no early apparent complications.   CONCLUSIONS:  1. Sinus rhythm upon presentation.  2. The patient had dual AV nodal physiology with inducible classic AV nodal reentrant tachycardia which could only be sustained for 20 seconds on isuprel.  there were no other accessory pathways or arrhythmias induced  3. Successful radiofrequency modification of the slow AV nodal pathway  4. No inducible arrhythmias following ablation.  5. No early apparent complications.   Mikle Bosworth.D.

## 2015-05-01 NOTE — Progress Notes (Signed)
Site area: RT IJ  RFV x3 Site Prior to Removal:  Level 0 / 0 Pressure Applied For:12min Manual:  yes  Patient Status During Pull: stable  Post Pull Site:  Level 0 / 0 Post Pull Instructions Given:  yes Post Pull Pulses Present: palpable Dressing Applied: clear/clear  Bedrest begins @ 1886 Comments:IJ removed by Coralee Rud

## 2015-05-01 NOTE — Discharge Instructions (Signed)

## 2015-05-01 NOTE — H&P (Signed)
HPI Mr. Paglia is referred today by Dr. Debara Pickett for evaluation and management of SVT. The patient has a h/o chest pain and was seen in the hospital. He had a stress test and with exertion had very dense ectopy and then went into SVT at 220/min. The was terminated with vagal maneuvers. The patient does not have much in the way of palpitations. He does have chest pressure which has been negative in evaluation for CAD. He has not had syncope. 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 No Known Allergies   Current Outpatient Prescriptions  Medication Sig Dispense Refill  . allopurinol (ZYLOPRIM) 300 MG tablet Take 300 mg by mouth daily.    [name] Kitchen aspirin EC 81 MG tablet Take 81 mg by mouth daily.    [name] Kitchen atorvastatin (LIPITOR) 10 MG tablet Take 1 tablet (10 mg total) by mouth daily. 90 tablet 1  . fluorouracil (EFUDEX) 5 % cream APPLY 1 APPLICATION TO AFFECTED AREA DAILY x 2 WEEKS, STOP x 2 WEEKS, REPEAT x 2 WEEKS EXTERNALLY 6 WEEKS  0  . ibuprofen (ADVIL,MOTRIN) 200 MG tablet Take 400 mg by mouth every 6 (six) hours as needed for moderate pain.    . metFORMIN (GLUCOPHAGE) 500 MG tablet Take 1 tablet (500 mg total) by mouth 2 (two) times daily with a meal. 60 tablet 3  . metoprolol tartrate (LOPRESSOR) 25 MG tablet Take 12.5 mg by mouth 2 (two) times daily.     . Multiple Vitamin (MULTIVITAMIN WITH MINERALS) TABS tablet Take 1 tablet by mouth as directed. TAKES OCCASIONALLY PER PT    . omeprazole (PRILOSEC) 20 MG capsule Take 20 mg by mouth daily.    [name] Kitchen telmisartan-hydrochlorothiazide (MICARDIS HCT) 80-12.5 MG per tablet Take 0.5 tablets by mouth daily.     No current facility-administered medications for this visit.     Past Medical History  Diagnosis Date  . Hypertension   . Diabetes mellitus without complication   . GERD (gastroesophageal reflux disease)   . Gout      ROS:  All systems reviewed and negative except as noted in the HPI.   Past Surgical History  Procedure Laterality Date  . Squamous cell carcinoma excision    . Basal cell carcinoma excision      off of back     Family History  Problem Relation Age of Onset  . Stroke Mother   . Hypertension Mother   . Alzheimer's disease Father   . Down syndrome Daughter      History   Social History  . Marital Status: Married    Spouse Name: N/A    Number of Children: N/A  . Years of Education: N/A   Occupational History  . Not on file.   Social History Main Topics  . Smoking status: Never Smoker   . Smokeless tobacco: Not on file  . Alcohol Use: No  . Drug Use: No  . Sexual Activity: Not on file   Other Topics Concern  . Not on file   Social History Narrative     BP 116/68 mmHg  Pulse 71  Ht 5\' 9"  (1.753 m)  Wt 183 lb (83.008 kg)  BMI 27.01 kg/m2  Physical Exam:  Well appearing 74 yo man, NAD HEENT: Unremarkable Neck: No JVD, no thyromegally Back: No CVA tenderness Lungs: Clear with no wheezes HEART: Regular rate rhythm, no murmurs, no rubs, no clicks Abd: soft, positive bowel sounds, no organomegally, no rebound,  no guarding Ext: 2 plus pulses, no edema, no cyanosis, no clubbing Skin: No rashes no nodules Neuro: CN II through XII intact, motor grossly intact  EKG - NSR with no pre-excitation  ECG during SVT - Narrow QRS tachy at 230/min.   Assess/Plan:            Paroxysmal SVT (supraventricular tachycardia) -     Status: Written Related Problem: Paroxysmal SVT (supraventricular tachycardia)   Expand All Collapse All   I have outlined the treatment options with the patient. He has symptomatic SVT and would be a candidate for catheter ablation. 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 If he choses not to maintain his license, then the issue of catheter ablation could be put on hold, waiting for additional symptoms. He is considering his options. I spent a long time discussing them with this patient.            HTN (hypertension) -     Status: Written Related Problem: HTN (hypertension)   Expand All Collapse All   His blood pressure is well controlled on medical therapy. Will follow.             Hyperlipidemia -    Status: Written Related Problem: Hyperlipidemia   Expand All Collapse All   He'll continue his low dose statin therapy and maintain a low fat diet.       Mikle Bosworth.D.

## 2015-05-01 NOTE — Discharge Planning (Addendum)
Discharge Summary   Patient ID: Justin [name] MRN: [id number], DOB/AGE: 1941/04/08 74 y.o. Admit date: 05/01/2015 D/C date:     05/01/2015  Primary Cardiologist: Dr. Debara Pickett Dr. Lovena Le (EP)  Principal Problem:   Paroxysmal SVT (supraventricular tachycardia) Active Problems:   Diabetes mellitus   Hyperlipidemia   Hypertension   GERD (gastroesophageal reflux disease)   Gout    Admission Dates: 05/01/15-05/01/15 Discharge Diagnosis: Classic AV nodal reentrant tachycardia - s/p successful EP study and RFCA   HPI: Justin [name] is a 74 y.o. male with a history of HTN, DM, GERD, GOUT and symptomatic recurrent short RP SVT who presented to [facility] today for planned RF ablation of SVT.  He is a patient of Dr. Debara Pickett. He was admitted to [facility] 11/2014 for chest pain. He ruled out for MI and underwent nuclear stress testing which was negative for ischemia. During the stress test while exercising he developed an SVT with heart rate that shot up abruptly to 220. This responded to vagal maneuvers and then reoccurred and eventually subsided. He was started on low-dose beta blocker and eventually discharged without any recurrent symptoms. He was placed on a monitor and has follow-up with cardiac electrophysiology. 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 He wore a monitor between 12/21/2015 and 01/18/2015 which showed no recurrent SVT and normal sinus rhythm. He was referred to Dr. Lovena Le for SVT ablation consideration, and this was offered to him. Prior to having this scheduled he developed an episode of chest pain again and that brought him to the hospital on 01/30/15. He was evaluated by Dr. Dorris Carnes, and had a right upper quadrant ultrasound which was unremarkable. He also had coronary CT angiogram which demonstrated mild to moderate coronary disease and an intermediate coronary calcium score of 202.  The etiology of his pain episodes is unclear however may be related to  reflux.   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 Lovena Le. This was scheduled for 05/01/15.  Hospital Course  SVT- s/p successful EP study and RFCA with Dr. Lovena Le  HTN- continue Micardis HCT  HLD- continue statin   DM- continue home regimen  The patient has had an uncomplicated hospital course and is recovering well. The femoral catheter site is stable. He has been seen by Dr. Lovena Le today and deemed ready for discharge home. All follow-up appointments have been scheduled. Discharge medications are listed below.   Discharge Vitals: Blood pressure 116/71, pulse 63, temperature 97.5 F (36.4 C), temperature source Oral, resp. rate 16, height 5\' 9"  (1.753 m), weight 175 lb (79.379 kg), SpO2 97 %.  Labs: Lab Results  Component Value Date   WBC 7.2 04/24/2015   HGB 11.1* 04/24/2015   HCT 33.2* 04/24/2015   MCV 84.5 04/24/2015   PLT 168.0 04/24/2015      Diagnostic Studies/Procedures   05/01/15: PREPROCEDURE DIAGNOSIS: SVT  POSTPROCEDURE DIAGNOSIS: Classic AV nodal reentrant tachycardia  PROCEDURES:  1. Comprehensive EP study.  2. Coronary sinus pacing and recording.  3. Mapping of supraventricular tachycardia.  4. Radiofrequency ablation of supraventricular tachycardia.  5. Arrhythmia induction with isuprel infused   INTRODUCTION: Justin [name] is a 74 y.o. male with a history of symptomatic recurrent short RP SVT who presents today for EP study and radiofrequency ablation. The patient has had recurrent symptomatic SVT. She has failed medical therapy with verapamil. She  now presents for EP study and radiofrequency ablation of SVT.   CONCLUSIONS:  1. Sinus rhythm upon presentation.  2. The patient had dual AV nodal physiology with inducible classic AV nodal reentrant tachycardia which could only be sustained for 20 seconds on isuprel. there were no other accessory pathways or  arrhythmias induced  3. Successful radiofrequency modification of the slow AV nodal pathway  4. No inducible arrhythmias following ablation.  5. No early apparent complications.   Cristopher Peru, M.D.  Discharge Medications     Medication List    TAKE these medications        allopurinol 300 MG tablet  Commonly known as:  ZYLOPRIM  Take 300 mg by mouth daily.     aspirin EC 81 MG tablet  Take 81 mg by mouth daily.     atorvastatin 20 MG tablet  Commonly known as:  LIPITOR  Take 1 tablet (20 mg total) by mouth daily.     fluorouracil 5 % cream  Commonly known as:  EFUDEX  APPLY 1 APPLICATION TO AFFECTED AREA DAILY x 2 WEEKS, STOP x 2 WEEKS, REPEAT x 2 WEEKS EXTERNALLY 6 WEEKS     ibuprofen 200 MG tablet  Commonly known as:  ADVIL,MOTRIN  Take 400 mg by mouth every 6 (six) hours as needed for moderate pain.     metFORMIN 500 MG tablet  Commonly known as:  GLUCOPHAGE  Take 1 tablet (500 mg total) by mouth 2 (two) times daily with a meal.     metoprolol tartrate 25 MG tablet  Commonly known as:  LOPRESSOR  Take 0.5 tablets (12.5 mg total) by mouth 2 (two) times daily.     multivitamin with minerals Tabs tablet  Take 1 tablet by mouth every other day.     nitroGLYCERIN 0.4 MG SL tablet  Commonly known as:  NITROSTAT  Place 1 tablet (0.4 mg total) under the tongue every 5 (five) minutes as needed for chest pain.     omeprazole 20 MG capsule  Commonly known as:  PRILOSEC  Take 20 mg by mouth 2 (two) times daily.     telmisartan-hydrochlorothiazide 80-12.5 MG per tablet  Commonly known as:  MICARDIS HCT  Take 0.5 tablets by mouth daily.        Disposition   The patient will be discharged in stable condition to home.  Follow-up Information    Follow up with Cristopher Peru, MD On 05/23/2015.   Specialty:  Cardiology   Why:  @ 4:15 pm   Contact information:   [address] [address] [phone]       Follow up with Pixie Casino, MD On 05/03/2015.   Specialty:  Cardiology   Why:  @ 10am   Contact information:   [place] [address] [phone]         Duration of Discharge Encounter: Greater than 30 minutes including physician and PA time.  Mable Fill R PA-C 05/01/2015, 2:26 PM   EP Attending  Patient seen and examined. Agree with above findings and plan. [place] for discharge home.  Mikle Bosworth.D.

## 2015-05-02 MED FILL — Bupivacaine HCl Preservative Free (PF) Inj 0.25%: INTRAMUSCULAR | Qty: 30 | Status: AC

## 2015-05-02 MED FILL — Heparin Sodium (Porcine) 2 Unit/ML in Sodium Chloride 0.9%: INTRAMUSCULAR | Qty: 500 | Status: AC

## 2015-05-03 ENCOUNTER — Ambulatory Visit: Payer: Medicare Other | Admitting: Internal Medicine

## 2015-05-23 ENCOUNTER — Encounter: Payer: Self-pay | Admitting: Internal Medicine

## 2015-05-23 ENCOUNTER — Ambulatory Visit (INDEPENDENT_AMBULATORY_CARE_PROVIDER_SITE_OTHER): Payer: Medicare Other | Admitting: Internal Medicine

## 2015-05-23 VITALS — BP 108/68 | HR 64 | Ht 69.0 in | Wt 183.6 lb

## 2015-05-23 DIAGNOSIS — I10 Essential (primary) hypertension: Secondary | ICD-10-CM

## 2015-05-23 DIAGNOSIS — I471 Supraventricular tachycardia: Secondary | ICD-10-CM

## 2015-05-23 DIAGNOSIS — E785 Hyperlipidemia, unspecified: Secondary | ICD-10-CM | POA: Diagnosis not present

## 2015-05-23 NOTE — Assessment & Plan Note (Signed)
This appears improved and possibly resolved. He will stop metoprolol today. He will continue his ARB/HCTZ.

## 2015-05-23 NOTE — Progress Notes (Signed)
HPI Justin [name] returns today for followup. He is a pleasant 74 yo man with a h/o SVT who underwent catheter ablation of AVNRT several weeks ago. He has had no recurrent symptomatic SVT since his procedure. He denies chest pain or sob. No syncope. 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  No Known Allergies   Current Outpatient Prescriptions  Medication Sig Dispense Refill  . allopurinol (ZYLOPRIM) 300 MG tablet Take 300 mg by mouth daily.    [name] Kitchen aspirin EC 81 MG tablet Take 81 mg by mouth daily.    [name] Kitchen atorvastatin (LIPITOR) 20 MG tablet Take 1 tablet (20 mg total) by mouth daily. 90 tablet 3  . fluorouracil (EFUDEX) 5 % cream APPLY 1 APPLICATION TO AFFECTED AREA DAILY x 2 WEEKS, STOP x 2 WEEKS, REPEAT x 2 WEEKS EXTERNALLY 6 WEEKS  0  . ibuprofen (ADVIL,MOTRIN) 200 MG tablet Take 400 mg by mouth every 6 (six) hours as needed for moderate pain.    . metFORMIN (GLUCOPHAGE) 500 MG tablet Take 1 tablet (500 mg total) by mouth 2 (two) times daily with a meal. 60 tablet 3  . metoprolol tartrate (LOPRESSOR) 25 MG tablet Take 0.5 tablets (12.5 mg total) by mouth 2 (two) times daily. 90 tablet 3  . Multiple Vitamin (MULTIVITAMIN WITH MINERALS) TABS tablet Take 1 tablet by mouth every other day.     . nitroGLYCERIN (NITROSTAT) 0.4 MG SL tablet Place 1 tablet (0.4 mg total) under the tongue every 5 (five) minutes as needed for chest pain. 25 tablet 3  . omeprazole (PRILOSEC) 20 MG capsule Take 20 mg by mouth 2 (two) times daily.     [name] Kitchen telmisartan-hydrochlorothiazide (MICARDIS HCT) 80-12.5 MG per tablet Take 0.5 tablets by mouth daily. 90 tablet 3   No current facility-administered medications for this visit.     Past Medical History  Diagnosis Date  . Hypertension   . Diabetes mellitus without complication   . GERD (gastroesophageal reflux disease)   . Gout   . Paroxysmal SVT (supraventricular tachycardia)     a. s/p RFCA on 05/01/15    ROS:   All systems reviewed and negative except as noted  in the HPI.   Past Surgical History  Procedure Laterality Date  . Squamous cell carcinoma excision    . Basal cell carcinoma excision      off of back  . Electrophysiologic study N/A 05/01/2015    Procedure: SVT Ablation;  Surgeon: Evans Lance, MD;  Location: [facility];  Service: Cardiovascular;  Laterality: N/A;     Family History  Problem Relation Age of Onset  . Stroke Mother   . Hypertension Mother   . Alzheimer's disease Father   . Heart failure Father   . Down syndrome Daughter      History   Social History  . Marital Status: Married    Spouse Name: N/A  . Number of Children: N/A  . Years of Education: N/A   Occupational History  . Not on file.   Social History Main Topics  . Smoking status: Never Smoker   . Smokeless tobacco: Not on file  . Alcohol Use: No  . Drug Use: No  . Sexual Activity: Not on file   Other Topics Concern  . Not on file   Social History Narrative     BP 108/68 mmHg  Pulse 64  Ht 5\' 9"  (1.753 m)  Wt 183 lb 9.6 oz (83.28 kg)  BMI  27.10 kg/m2  Physical Exam:  Well appearing 74 yo man, NAD HEENT: Unremarkable Neck:  No JVD, no thyromegally Back:  No CVA tenderness Lungs:  Clear with no wheezes HEART:  Regular rate rhythm, no murmurs, no rubs, no clicks Abd:  soft, positive bowel sounds, no organomegally, no rebound, no guarding Ext:  2 plus pulses, no edema, no cyanosis, no clubbing Skin:  No rashes no nodules Neuro:  CN II through XII intact, motor grossly intact  EKG - NSR  Assess/Plan:

## 2015-05-23 NOTE — Patient Instructions (Addendum)
Medication Instructions:  Your physician has recommended you make the following change in your medication:  1) Stop Metoprolol    Labwork: None ordered  Testing/Procedures: Your physician has recommended that you wear a holter monitor. Holter monitors are medical devices that record the heart's electrical activity. Doctors most often use these monitors to diagnose arrhythmias. Arrhythmias are problems with the speed or rhythm of the heartbeat. The monitor is a small, portable device. You can wear one while you do your normal daily activities. This is usually used to diagnose what is causing palpitations/syncope (passing out).  Your physician has requested that you have an exercise tolerance test. For further information please visit [email or web address]. Please also follow instruction sheet, as given.    Follow-Up: Your physician recommends that you schedule a follow-up appointment in: August after both test with Dr Lovena Le   Any Other Special Instructions Will Be Listed Below (If Applicable).

## 2015-05-23 NOTE — Assessment & Plan Note (Signed)
He is doing well from an arrhythmia perspective. I have asked the patient to stop his metoprolol. 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

## 2015-05-23 NOTE — Assessment & Plan Note (Signed)
He will continue his statin therapy.  

## 2015-06-23 DIAGNOSIS — D509 Iron deficiency anemia, unspecified: Secondary | ICD-10-CM | POA: Diagnosis not present

## 2015-06-23 DIAGNOSIS — R972 Elevated prostate specific antigen [PSA]: Secondary | ICD-10-CM | POA: Diagnosis not present

## 2015-06-23 DIAGNOSIS — M549 Dorsalgia, unspecified: Secondary | ICD-10-CM | POA: Diagnosis not present

## 2015-06-23 DIAGNOSIS — Z0000 Encounter for general adult medical examination without abnormal findings: Secondary | ICD-10-CM | POA: Diagnosis not present

## 2015-06-23 DIAGNOSIS — Z23 Encounter for immunization: Secondary | ICD-10-CM | POA: Diagnosis not present

## 2015-06-23 DIAGNOSIS — N281 Cyst of kidney, acquired: Secondary | ICD-10-CM | POA: Diagnosis not present

## 2015-06-23 DIAGNOSIS — K5730 Diverticulosis of large intestine without perforation or abscess without bleeding: Secondary | ICD-10-CM | POA: Diagnosis not present

## 2015-06-23 DIAGNOSIS — S0191XA Laceration without foreign body of unspecified part of head, initial encounter: Secondary | ICD-10-CM | POA: Diagnosis not present

## 2015-06-23 DIAGNOSIS — K219 Gastro-esophageal reflux disease without esophagitis: Secondary | ICD-10-CM | POA: Diagnosis not present

## 2015-06-23 DIAGNOSIS — I10 Essential (primary) hypertension: Secondary | ICD-10-CM | POA: Diagnosis not present

## 2015-06-23 DIAGNOSIS — E119 Type 2 diabetes mellitus without complications: Secondary | ICD-10-CM | POA: Diagnosis not present

## 2015-06-23 DIAGNOSIS — M109 Gout, unspecified: Secondary | ICD-10-CM | POA: Diagnosis not present

## 2015-07-03 IMAGING — CT CT ABDOMEN WO/W CM
4 of 10 series · 14 of 36 positions shown, 18 images · IV contrast (agent unspecified)
Comparison: Ultrasound exam 01/30/2015.  Chest CT from 12/08/2014.

CLINICAL DATA: Subsequent encounter for complex cysts of right
kidney

EXAM:
CT ABDOMEN WITHOUT AND WITH CONTRAST
TECHNIQUE: Multidetector CT imaging of the abdomen was performed following the
standard protocol before and following the bolus administration of
intravenous contrast.
CONTRAST:  100 cc [facility]

[Series 6: arterial 2.5 · axial · arterial · 0.73mm/px · z∈[-194,-54]mm · 3 of 113 slices shown, 7 images]
[im 29/113  soft-tissue]
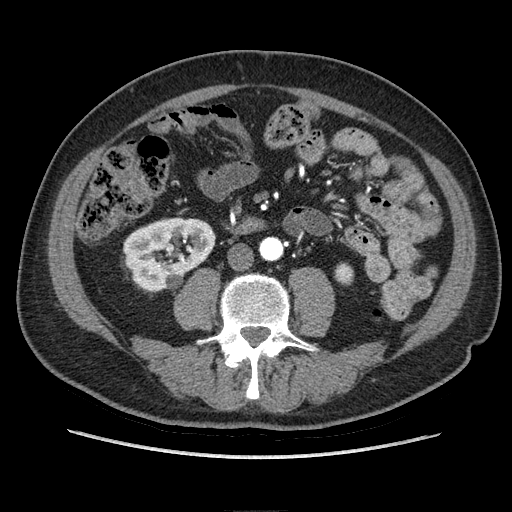
[im 29/113  lung]
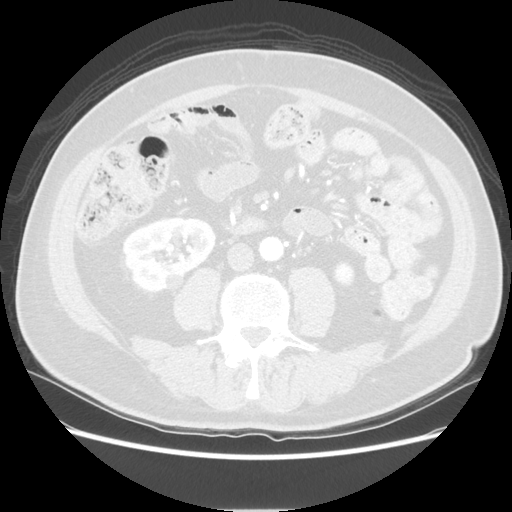
[im 29/113  bone]
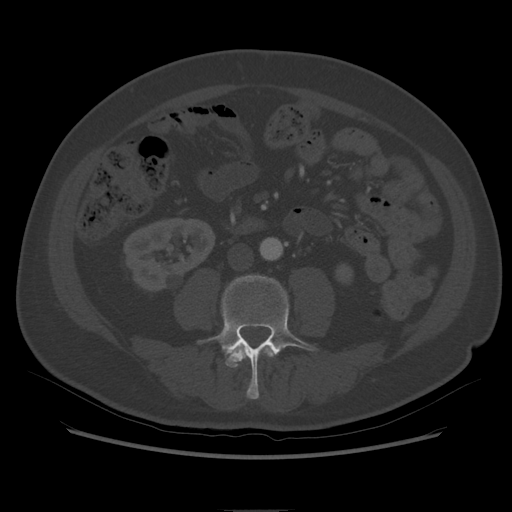
[im 57/113  soft-tissue]
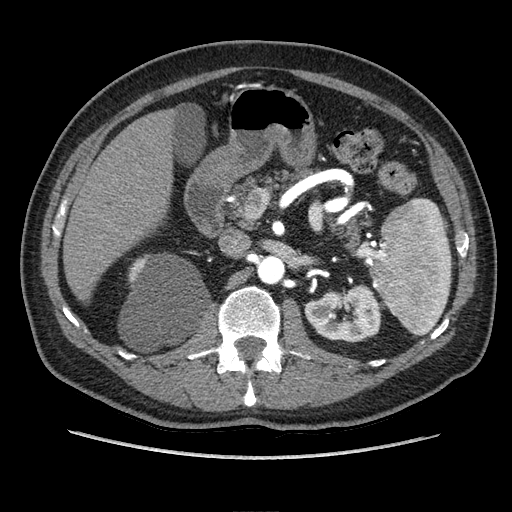
[im 57/113  lung]
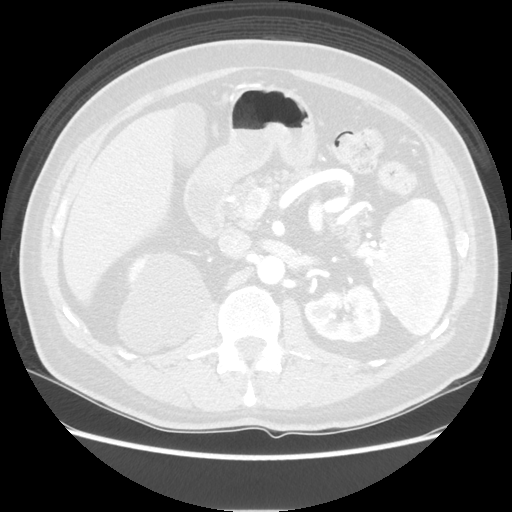
[im 85/113  soft-tissue]
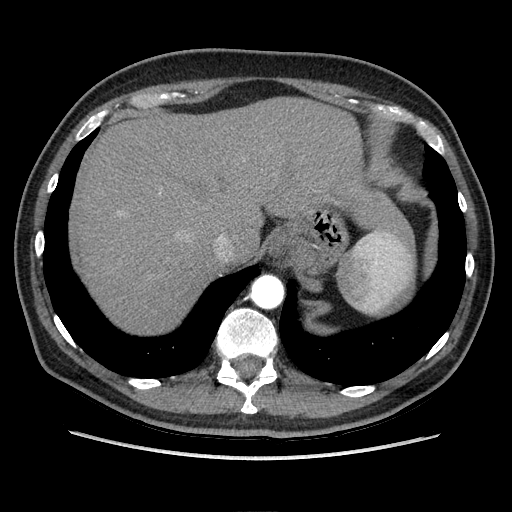
[im 85/113  lung]
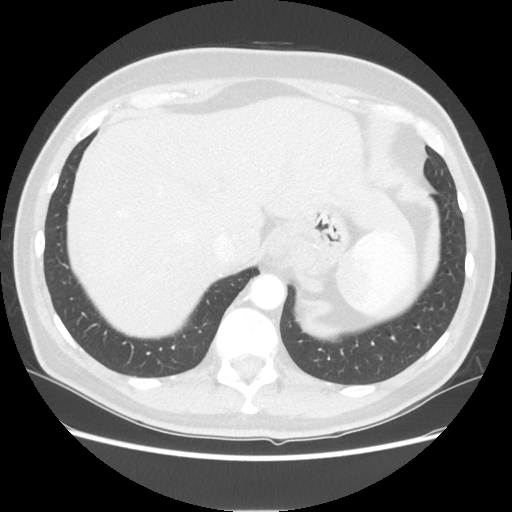

[Series 9: nephrographic 2.5 mm · axial · 0.73mm/px · z∈[-194,-54]mm · 3 of 113 slices shown]
[im 29/113  soft-tissue]
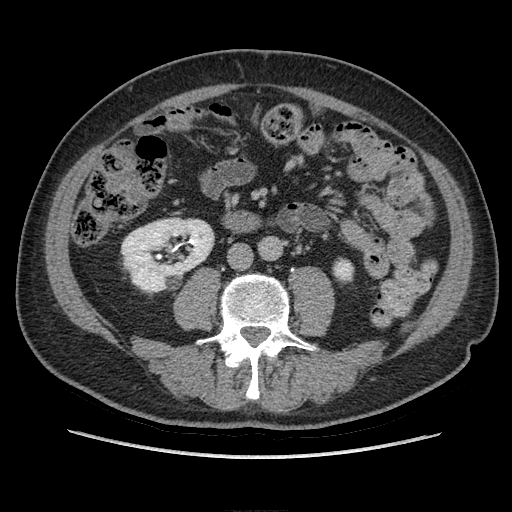
[im 57/113  soft-tissue]
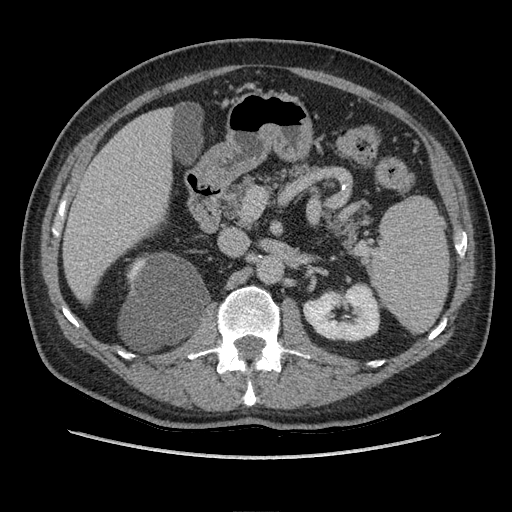
[im 85/113  soft-tissue]
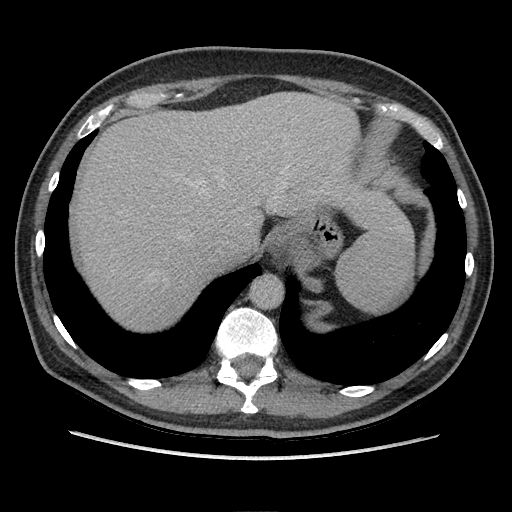

[Series 602: sagittal body · sagittal · 0.71mm/px · 4 of 146 slices shown (1 of 2)]
[im 30/146  soft-tissue]
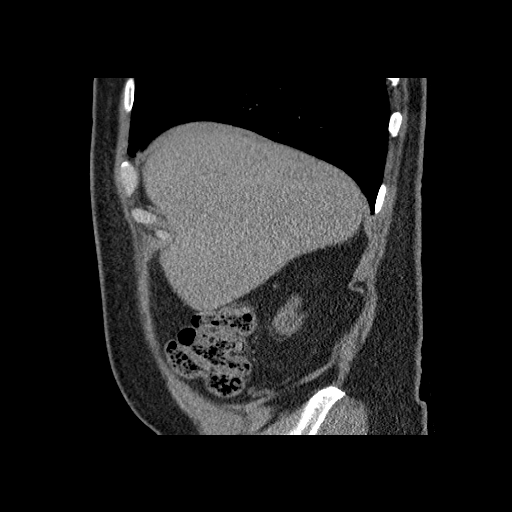
[im 59/146  soft-tissue]
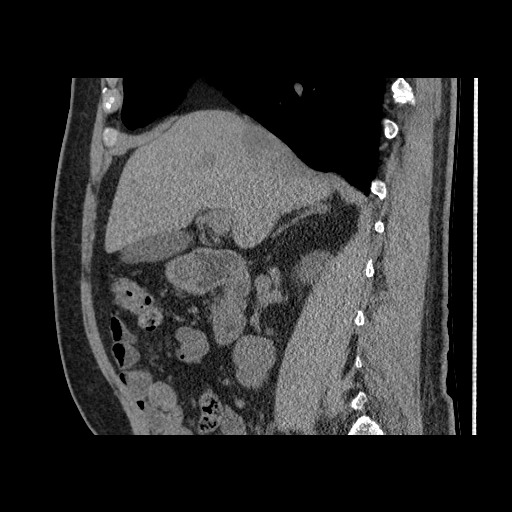
[im 88/146  soft-tissue]
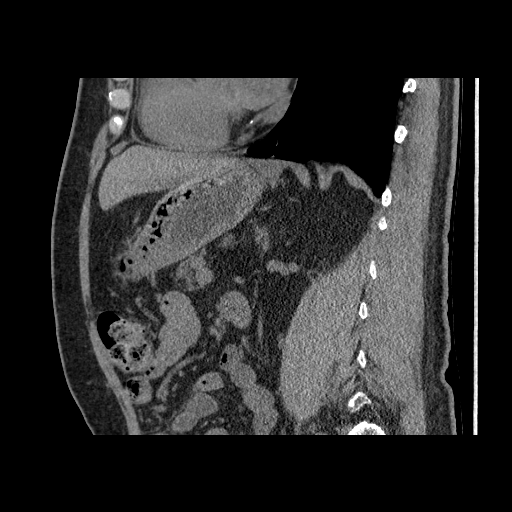
[im 117/146  soft-tissue]
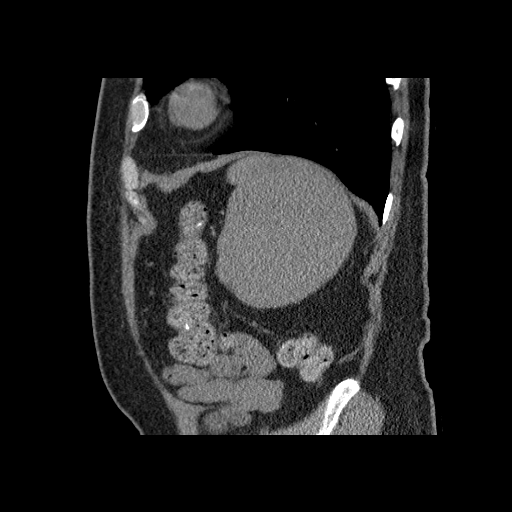

[Series 605: sagittal body · sagittal · 0.73mm/px · 4 of 148 slices shown (2 of 2)]
[im 30/148  soft-tissue]
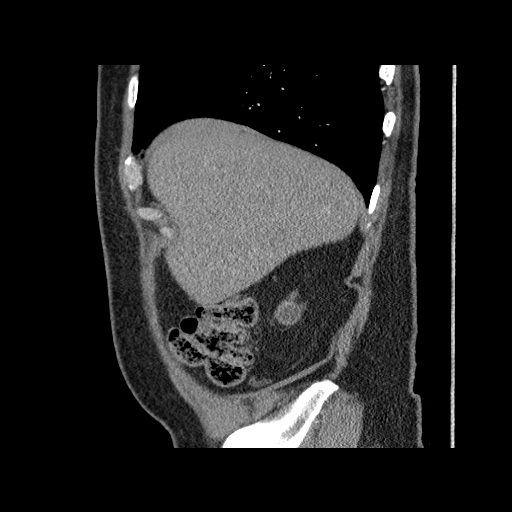
[im 59/148  soft-tissue]
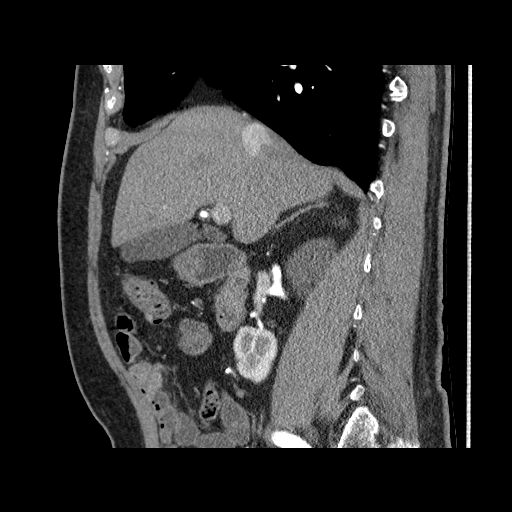
[im 89/148  soft-tissue]
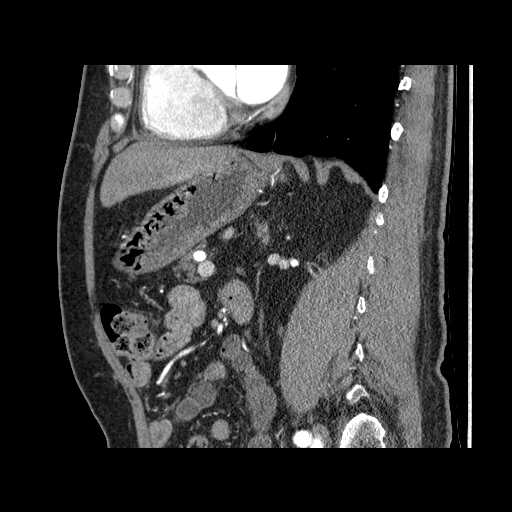
[im 118/148  soft-tissue]
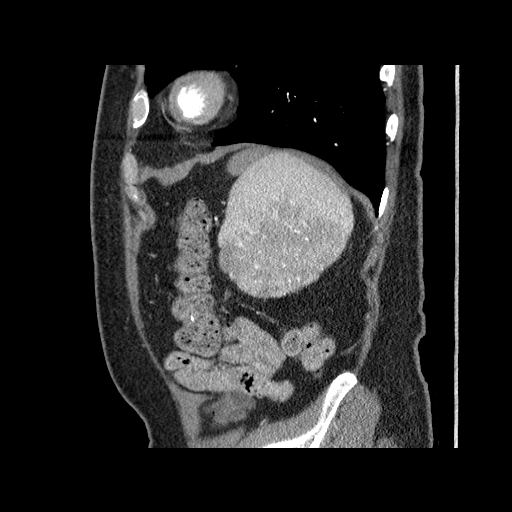

[14 of 36 positions shown; findings below may reference images not displayed]

FINDINGS: Lower chest:  Unremarkable.

Hepatobiliary: No focal abnormality within the liver parenchyma.
There is no evidence for gallstones, gallbladder wall thickening, or
pericholecystic fluid. No intrahepatic or extrahepatic biliary
dilation.

Pancreas: No focal mass lesion. No dilatation of the main duct. No
intraparenchymal cyst. No peripancreatic edema.

Spleen: No splenomegaly. No focal mass lesion.

Adrenals/Urinary Tract: No adrenal nodule or mass.

Multiple cysts are identified in the upper pole the right kidney.
5.4 x 6.7 cm cyst in the upper pole has thin calcification
associated with the cranial and caudal margins. No discernible
enhancement within the wall of the cyst. No evidence for internal
enhancing septations. No wall irregularity. A second 5.7 cm simple
cyst is seen in the extreme upper pole of the right kidney.
Additional smaller simple cysts are seen posteriorly in the mid
right kidney. 1.8 cm exophytic lesion from the interpolar right
kidney has a relatively prominent hyper attenuating rim, but no
definite enhancement can be demonstrated within this lesion.

Multiple tiny cortical cysts are seen in the left kidney. The
largest left renal cyst is 11 mm in the upper pole region.

No evidence for hydronephrosis.  No proximal hydroureter.

Stomach/Bowel: Stomach is nondistended. No gastric wall thickening.
No evidence of outlet obstruction. Duodenum diverticulum noted.
Visualized small bowel and colon of the abdomen is unremarkable.

Vascular/Lymphatic: Atherosclerotic calcification is noted in the
wall of the abdominal aorta without aneurysm. Prominent
atherosclerotic plaque is seen in the proximal celiac axis. There is
no gastrohepatic or hepatoduodenal ligament lymphadenopathy. Laminar
flow of under opacified blood is seen in the portal vein.

Other: No intraperitoneal free fluid.

Musculoskeletal: Bone windows reveal no worrisome lytic or sclerotic
osseous lesions.
IMPRESSION: Multiple cysts in the right kidney of varying complexity. Dominant
6.7 cm upper pole cyst has thin rim calcification and is compatible
with a Bosniak II cyst. 1.8 cm exophytic cystic lesion in the
interpolar region of the right kidney has a prominent high
attenuation rim but no definite enhancement can be seen within this
lesion. This is considered a Bosniak II F lesion and follow-up
imaging in 6 months is recommended to ensure stability. Other simple
cysts are seen in both kidneys.

## 2015-07-06 DIAGNOSIS — E871 Hypo-osmolality and hyponatremia: Secondary | ICD-10-CM | POA: Diagnosis not present

## 2015-07-18 ENCOUNTER — Encounter: Payer: Self-pay | Admitting: Physician Assistant

## 2015-08-01 ENCOUNTER — Encounter: Payer: Medicare Other | Admitting: Physician Assistant

## 2015-08-15 ENCOUNTER — Ambulatory Visit: Payer: Medicare Other | Admitting: Internal Medicine

## 2015-08-15 ENCOUNTER — Ambulatory Visit (INDEPENDENT_AMBULATORY_CARE_PROVIDER_SITE_OTHER): Payer: Medicare Other

## 2015-08-15 DIAGNOSIS — I471 Supraventricular tachycardia: Secondary | ICD-10-CM

## 2015-08-23 DIAGNOSIS — H524 Presbyopia: Secondary | ICD-10-CM | POA: Diagnosis not present

## 2015-08-23 DIAGNOSIS — E119 Type 2 diabetes mellitus without complications: Secondary | ICD-10-CM | POA: Diagnosis not present

## 2015-08-23 DIAGNOSIS — H2513 Age-related nuclear cataract, bilateral: Secondary | ICD-10-CM | POA: Diagnosis not present

## 2015-08-23 DIAGNOSIS — H43813 Vitreous degeneration, bilateral: Secondary | ICD-10-CM | POA: Diagnosis not present

## 2015-08-25 ENCOUNTER — Encounter: Payer: Medicare Other | Admitting: Physician Assistant

## 2015-08-25 ENCOUNTER — Encounter (INDEPENDENT_AMBULATORY_CARE_PROVIDER_SITE_OTHER): Payer: Medicare Other

## 2015-08-25 DIAGNOSIS — I471 Supraventricular tachycardia: Secondary | ICD-10-CM | POA: Diagnosis not present

## 2015-08-25 LAB — EXERCISE TOLERANCE TEST
CHL CUP MPHR: 145 {beats}/min
CSEPEW: 10.1 METS
Exercise duration (min): 9 min
Exercise duration (sec): 3 s
Peak HR: 155 {beats}/min
Percent HR: 106 %
RPE: 17
Rest HR: 69 {beats}/min

## 2015-08-30 ENCOUNTER — Encounter: Payer: Self-pay | Admitting: Internal Medicine

## 2015-08-30 ENCOUNTER — Ambulatory Visit (INDEPENDENT_AMBULATORY_CARE_PROVIDER_SITE_OTHER): Payer: Medicare Other | Admitting: Internal Medicine

## 2015-08-30 VITALS — BP 130/74 | HR 74 | Ht 69.0 in | Wt 180.0 lb

## 2015-08-30 DIAGNOSIS — I471 Supraventricular tachycardia: Secondary | ICD-10-CM | POA: Diagnosis not present

## 2015-08-30 DIAGNOSIS — E785 Hyperlipidemia, unspecified: Secondary | ICD-10-CM

## 2015-08-30 NOTE — Assessment & Plan Note (Signed)
His blood pressure is under satisfactory control. He is encouraged to maintain a low sodium diet.

## 2015-08-30 NOTE — Assessment & Plan Note (Addendum)
He is encouraged to maintain a low fat diet and continue his statin.

## 2015-08-30 NOTE — Patient Instructions (Signed)
Medication Instructions:  Your physician recommends that you continue on your current medications as directed. Please refer to the Current Medication list given to you today.   Labwork: None ordered  Testing/Procedures: None ordered  Follow-Up: No follow up is needed at this time with Dr. Taylor.  He will see you on an as needed basis.   Any Other Special Instructions Will Be Listed Below (If Applicable). Thank you for choosing [facility]!!         

## 2015-08-30 NOTE — Assessment & Plan Note (Signed)
The patient is doing well s/p cathter ablation. He has had no recurrent SVT. His holter monitor is reassuring as is his stress test. 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 At this point I do not anticipate a need to return to see me.

## 2015-08-30 NOTE — Progress Notes (Signed)
HPI [name] returns today for followup. 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 He was rendered non-inducibe with regard to his SVT. He has had no recurrent symptomatic SVT since his procedure. He denies chest pain or sob. No syncope. 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 His holter monitor demonstrated PAC's and PVC's and non-sustained atrial tachycardia. There were no sustained arrhythmias.  No Known Allergies   Current Outpatient Prescriptions  Medication Sig Dispense Refill  . allopurinol (ZYLOPRIM) 300 MG tablet Take 300 mg by mouth daily.    [name] Kitchen aspirin EC 81 MG tablet Take 81 mg by mouth daily.    [name] Kitchen atorvastatin (LIPITOR) 20 MG tablet Take 1 tablet (20 mg total) by mouth daily. 90 tablet 3  . fluorouracil (EFUDEX) 5 % cream APPLY 1 APPLICATION TO AFFECTED AREA DAILY x 2 WEEKS, STOP x 2 WEEKS, REPEAT x 2 WEEKS EXTERNALLY 6 WEEKS  0  . ibuprofen (ADVIL,MOTRIN) 200 MG tablet Take 400 mg by mouth every 6 (six) hours as needed for moderate pain.    . metFORMIN (GLUCOPHAGE) 500 MG tablet Take 1 tablet (500 mg total) by mouth 2 (two) times daily with a meal. 60 tablet 3  . Multiple Vitamin (MULTIVITAMIN WITH MINERALS) TABS tablet Take 1 tablet by mouth every other day.     . nitroGLYCERIN (NITROSTAT) 0.4 MG SL tablet Place 1 tablet (0.4 mg total) under the tongue every 5 (five) minutes as needed for chest pain. 25 tablet 3  . omeprazole (PRILOSEC) 20 MG capsule Take 20 mg by mouth 2 (two) times daily.     [name] Kitchen telmisartan-hydrochlorothiazide (MICARDIS HCT) 80-12.5 MG per tablet Take 0.5 tablets by mouth daily. 90 tablet 3   No current facility-administered medications for this visit.     Past Medical History  Diagnosis Date  . Hypertension   . Diabetes mellitus without  complication   . GERD (gastroesophageal reflux disease)   . Gout   . Paroxysmal SVT (supraventricular tachycardia)     a. s/p RFCA on 05/01/15    ROS:   All systems reviewed and negative except as noted in the HPI.   Past Surgical History  Procedure Laterality Date  . Squamous cell carcinoma excision    . Basal cell carcinoma excision      off of back  . Electrophysiologic study N/A 05/01/2015    Procedure: SVT Ablation;  Surgeon: Evans Lance, MD;  Location: [facility];  Service: Cardiovascular;  Laterality: N/A;     Family History  Problem Relation Age of Onset  . Stroke Mother   . Hypertension Mother   . Alzheimer's disease Father   . Heart failure Father   . Down syndrome Daughter      Social History   Social History  . Marital Status: Married    Spouse Name: N/A  . Number of Children: N/A  . Years of Education: N/A   Occupational History  . Not on file.   Social History Main Topics  . Smoking status: Never Smoker   . Smokeless tobacco: Not on file  . Alcohol Use: No  . Drug Use: No  . Sexual Activity: Not on file   Other Topics Concern  . Not on file   Social History  Narrative     BP 130/74 mmHg  Pulse 74  Ht 5\' 9"  (1.753 m)  Wt 180 lb (81.647 kg)  BMI 26.57 kg/m2  Physical Exam:  Well appearing 74 yo man, NAD HEENT: Unremarkable Neck:  No JVD, no thyromegally Back:  No CVA tenderness Lungs:  Clear with no wheezes HEART:  Regular rate rhythm, no murmurs, no rubs, no clicks Abd:  soft, positive bowel sounds, no organomegally, no rebound, no guarding Ext:  2 plus pulses, no edema, no cyanosis, no clubbing Skin:  No rashes no nodules Neuro:  CN II through XII intact, motor grossly intact  EKG - NSR  Assess/Plan:

## 2015-09-01 ENCOUNTER — Other Ambulatory Visit: Payer: Self-pay | Admitting: Family Medicine

## 2015-09-01 DIAGNOSIS — N281 Cyst of kidney, acquired: Secondary | ICD-10-CM

## 2015-09-06 ENCOUNTER — Ambulatory Visit
Admission: RE | Admit: 2015-09-06 | Discharge: 2015-09-06 | Disposition: A | Discharge status: Home / Self Care | Payer: Medicare Other | Source: Ambulatory Visit | Attending: Family Medicine | Admitting: Family Medicine

## 2015-09-06 DIAGNOSIS — N281 Cyst of kidney, acquired: Secondary | ICD-10-CM | POA: Diagnosis not present

## 2015-09-06 MED ORDER — IOPAMIDOL (ISOVUE-370) INJECTION 76%
80.0000 mL | Freq: Once | INTRAVENOUS | Status: AC | PRN
  Administered 2015-09-06: 80 mL via INTRAVENOUS

## 2015-10-24 ENCOUNTER — Telehealth: Payer: Self-pay | Admitting: Internal Medicine

## 2015-10-24 ENCOUNTER — Telehealth: Payer: Self-pay | Admitting: Cardiology

## 2015-10-24 NOTE — Telephone Encounter (Signed)
Error

## 2015-10-24 NOTE — Telephone Encounter (Signed)
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 Please call if you have questions   Thanks

## 2015-10-25 NOTE — Telephone Encounter (Signed)
Please let this patient know when this letter is ready.

## 2015-10-30 ENCOUNTER — Encounter: Payer: Self-pay | Admitting: *Deleted

## 2015-10-30 ENCOUNTER — Encounter: Payer: Self-pay | Admitting: Internal Medicine

## 2015-10-30 NOTE — Telephone Encounter (Signed)
I routed a letter for Justin [name] for the [name]. I tried to add the [facility] doctor to get the fax. Could you print the letter, stamp my name on it and send it to him along with copies of all of his cardiac tests (coronary CT angiogram, bruce treadmill test, etc ... ).  Thanks.  Dr. Lemmie Evens

## 2015-10-30 NOTE — Telephone Encounter (Signed)
Letter and test printed. Patient notified he can come pick this up.

## 2015-11-01 DIAGNOSIS — E1165 Type 2 diabetes mellitus with hyperglycemia: Secondary | ICD-10-CM | POA: Diagnosis not present

## 2015-11-01 DIAGNOSIS — Z7984 Long term (current) use of oral hypoglycemic drugs: Secondary | ICD-10-CM | POA: Diagnosis not present

## 2015-11-06 ENCOUNTER — Telehealth: Payer: Self-pay | Admitting: Internal Medicine

## 2015-11-06 NOTE — Telephone Encounter (Addendum)
Outgoing call to patient. He wanted echo results sent to provided fax for review by Koppel  Results sent to number provided.

## 2015-11-06 NOTE — Telephone Encounter (Signed)
Pt said Dr Lorel Monaco wants to know if he have had an ekg,, I think it might be an echo he is referring? He told him he had an ultrasound.Please call pt.

## 2015-12-08 DIAGNOSIS — Z23 Encounter for immunization: Secondary | ICD-10-CM | POA: Diagnosis not present

## 2015-12-24 DIAGNOSIS — I639 Cerebral infarction, unspecified: Secondary | ICD-10-CM

## 2015-12-24 DIAGNOSIS — I6381 Other cerebral infarction due to occlusion or stenosis of small artery: Secondary | ICD-10-CM

## 2015-12-24 HISTORY — DX: Cerebral infarction, unspecified: I63.9

## 2015-12-24 HISTORY — DX: Other cerebral infarction due to occlusion or stenosis of small artery: I63.81

## 2015-12-28 DIAGNOSIS — I10 Essential (primary) hypertension: Secondary | ICD-10-CM | POA: Diagnosis not present

## 2015-12-28 DIAGNOSIS — Z7984 Long term (current) use of oral hypoglycemic drugs: Secondary | ICD-10-CM | POA: Diagnosis not present

## 2015-12-28 DIAGNOSIS — E1165 Type 2 diabetes mellitus with hyperglycemia: Secondary | ICD-10-CM | POA: Diagnosis not present

## 2016-01-04 DIAGNOSIS — L821 Other seborrheic keratosis: Secondary | ICD-10-CM | POA: Diagnosis not present

## 2016-01-04 DIAGNOSIS — L570 Actinic keratosis: Secondary | ICD-10-CM | POA: Diagnosis not present

## 2016-01-04 DIAGNOSIS — Z23 Encounter for immunization: Secondary | ICD-10-CM | POA: Diagnosis not present

## 2016-01-04 DIAGNOSIS — L814 Other melanin hyperpigmentation: Secondary | ICD-10-CM | POA: Diagnosis not present

## 2016-01-04 DIAGNOSIS — Z85828 Personal history of other malignant neoplasm of skin: Secondary | ICD-10-CM | POA: Diagnosis not present

## 2016-01-04 DIAGNOSIS — D1800 Hemangioma unspecified site: Secondary | ICD-10-CM | POA: Diagnosis not present

## 2016-01-04 DIAGNOSIS — Z86018 Personal history of other benign neoplasm: Secondary | ICD-10-CM | POA: Diagnosis not present

## 2016-01-07 ENCOUNTER — Other Ambulatory Visit: Payer: Self-pay | Admitting: Internal Medicine

## 2016-01-17 ENCOUNTER — Encounter: Payer: Self-pay | Admitting: *Deleted

## 2016-01-19 NOTE — Telephone Encounter (Signed)
This encounter was created in error - please disregard.

## 2016-02-04 ENCOUNTER — Other Ambulatory Visit: Payer: Self-pay | Admitting: Internal Medicine

## 2016-03-20 ENCOUNTER — Ambulatory Visit (INDEPENDENT_AMBULATORY_CARE_PROVIDER_SITE_OTHER): Payer: Medicare Other | Admitting: Internal Medicine

## 2016-03-20 ENCOUNTER — Encounter: Payer: Self-pay | Admitting: Internal Medicine

## 2016-03-20 VITALS — BP 106/60 | HR 70 | Ht 68.0 in | Wt 173.0 lb

## 2016-03-20 DIAGNOSIS — E785 Hyperlipidemia, unspecified: Secondary | ICD-10-CM

## 2016-03-20 DIAGNOSIS — Z9889 Other specified postprocedural states: Secondary | ICD-10-CM

## 2016-03-20 DIAGNOSIS — I10 Essential (primary) hypertension: Secondary | ICD-10-CM

## 2016-03-20 DIAGNOSIS — Z8679 Personal history of other diseases of the circulatory system: Secondary | ICD-10-CM

## 2016-03-20 DIAGNOSIS — I2510 Atherosclerotic heart disease of native coronary artery without angina pectoris: Secondary | ICD-10-CM

## 2016-03-20 HISTORY — DX: Atherosclerotic heart disease of native coronary artery without angina pectoris: I25.10

## 2016-03-20 MED ORDER — ATORVASTATIN CALCIUM 20 MG PO TABS: 20.0000 mg | ORAL_TABLET | Freq: Every day | ORAL | Status: DC

## 2016-03-20 NOTE — Patient Instructions (Signed)
Your physician recommends that you return for lab work FASTING to check cholesterol   Your physician wants you to follow-up in: 1 year with Dr. Hilty. You will receive a reminder letter in the mail two months in advance. If you don't receive a letter, please call our office to schedule the follow-up appointment.   

## 2016-03-20 NOTE — Progress Notes (Signed)
OFFICE NOTE  Chief Complaint:  Routine follow-up  Primary Care Physician: Mayra Neer, MD  HPI:  Justin [name] is a pleasant 75 year old male who I met recently in the hospital. He presented with chest pain that he developed while sitting his computer. The symptoms had some typical and atypical features for angina. He ruled out for MI and underwent nuclear stress testing which was negative for ischemia. During the stress test while exercising he developed an SVT with heart rate that shot up abruptly to 220. This responded to vagal maneuvers and then reoccurred and eventually subsided. He was started on low-dose beta blocker and eventually discharged without any recurrent symptoms. He was placed on a monitor and has follow-up with cardiac electrophysiology to see Dr. Lovena Le in January. 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 He was started on Lipitor which was started the hospital for for coronary disease, however his LDL is only 95. He is a diabetic. His metformin was increased to 500 twice a day which is improved his blood sugars. He recently saw his primary care provider felt that he was doing well.  I saw Justin [name] back in the office today. He was referred to Dr. Cristopher Peru for SVT ablation consideration, and this was offered to him. Prior to having this scheduled he developed an episode of chest pain again and that brought him to the hospital yesterday. He was evaluated by Dr. Dorris Carnes, and had a right upper quadrant ultrasound which was unremarkable. He also had coronary CT angiogram which demonstrated mild to moderate coronary disease and an intermediate coronary calcium score of 202. He has subsequently worn a monitor between 12/21/2015 and 01/18/2015 which showed no recurrent SVT and normal sinus rhythm.  The etiology of his pain episodes is unclear however may be related  to reflux.   Justin [name] returns today for follow-up. He reports he is done very well without any recurrent SVT status post catheter ablation by Dr. Cristopher Peru. He is seeing me primarily for risk factor modification and treatment of asymptomatic coronary artery disease which was seen by coronary artery CT angiography. He was found to have 2 areas of mild coronary artery disease and a calcium score which was moderately elevated at 202. Since then he's been on lipid therapy with Lipitor and takes daily aspirin as well as my card is HCTZ for hypertension. Blood pressure is well-controlled today 106/60. He is due for repeat lipid profile and will need a refill on his cholesterol medication. He denies any chest pain or shortness of breath. 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  PMHx:  Past Medical History  Diagnosis Date  . Hypertension   . Diabetes mellitus without complication ([place])   . GERD (gastroesophageal reflux disease)   . Gout   . Paroxysmal SVT (supraventricular tachycardia) ([facility])     a. s/p RFCA on 05/01/15    Past Surgical History  Procedure Laterality Date  . Squamous cell carcinoma excision    . Basal cell carcinoma excision      off of back  . Electrophysiologic study N/A 05/01/2015    Procedure: SVT Ablation;  Surgeon: Evans Lance, MD;  Location: [facility];  Service: Cardiovascular;  Laterality: N/A;    FAMHx:  Family History  Problem Relation Age of  Onset  . Stroke Mother   . Hypertension Mother   . Alzheimer's disease Father   . Heart failure Father   . Down syndrome Daughter     SOCHx:   reports that he has never smoked. He does not have any smokeless tobacco history on file. He reports that he does not drink alcohol or use illicit drugs.  ALLERGIES:  No Known Allergies  ROS: Pertinent items noted in HPI and remainder of comprehensive ROS otherwise negative.  HOME MEDS: Current Outpatient Prescriptions   Medication Sig Dispense Refill  . allopurinol (ZYLOPRIM) 300 MG tablet Take 300 mg by mouth daily.    [name] Kitchen aspirin EC 81 MG tablet Take 81 mg by mouth daily.    [name] Kitchen atorvastatin (LIPITOR) 20 MG tablet Take 1 tablet (20 mg total) by mouth daily. 90 tablet 3  . fluorouracil (EFUDEX) 5 % cream APPLY 1 APPLICATION TO AFFECTED AREA DAILY x 2 WEEKS, STOP x 2 WEEKS, REPEAT x 2 WEEKS EXTERNALLY 6 WEEKS  0  . ibuprofen (ADVIL,MOTRIN) 200 MG tablet Take 400 mg by mouth every 6 (six) hours as needed for moderate pain.    . metFORMIN (GLUCOPHAGE) 500 MG tablet Take 1 tablet (500 mg total) by mouth 2 (two) times daily with a meal. 60 tablet 3  . MICARDIS HCT 80-12.5 MG tablet TAKE ONE-HALF (1/2) TABLET DAILY (DISCARD REMAINDER AFTER OPENING). 90 tablet 1  . nitroGLYCERIN (NITROSTAT) 0.4 MG SL tablet Place 1 tablet (0.4 mg total) under the tongue every 5 (five) minutes as needed for chest pain. 25 tablet 3  . omeprazole (PRILOSEC) 20 MG capsule Take 20 mg by mouth 2 (two) times daily.      No current facility-administered medications for this visit.    LABS/IMAGING: No results found for this or any previous visit (from the past 48 hour(s)). No results found.  VITALS: BP 106/60 mmHg  Pulse 70  Ht 5' 8"  (1.727 m)  Wt 173 lb (78.472 kg)  BMI 26.31 kg/m2  EXAM: General appearance: alert and no distress Neck: no carotid bruit and no JVD Lungs: clear to auscultation bilaterally Heart: regular rate and rhythm, S1, S2 normal, no murmur, click, rub or gallop Abdomen: soft, non-tender; bowel sounds normal; no masses,  no organomegaly Extremities: extremities normal, atraumatic, no cyanosis or edema Pulses: 2+ and symmetric Skin: Skin color, texture, turgor normal. No rashes or lesions Neurologic: Grossly normal  EKG: Normal sinus rhythm at 70  ASSESSMENT: 1. Mild CAD - coronary CT angiogram showed mild to moderate coronary disease which was nonobstructive and an intermediate coronary calcium score of  202. 2. PSVT s/p ablation by Dr. Lovena Le 3. Diabetes type 2 4. Relative dyslipidemia  PLAN: 1.   Justin [name] is doing very well. He has had no further chest discomfort. His CT angiogram demonstrate mild to moderate coronary disease with an intermediate coronary artery calcium score which were treating medically. He had successful ablation of his SVT and has had no further recurrences. He is due for repeat lipid testing which we'll send off today. Unfortunately, he is not yet been able to regain his [facility] medical certificate. Blood pressure appears appropriate controlled on his current medicine. Plan to continue current medications and I'll adjust his cholesterol medicine is necessary.  Follow-up with me annually or sooner as necessary.  Pixie Casino, MD, [facility] Attending Cardiologist [place] Danilyn Cocke 03/20/2016, 1:12 PM

## 2016-03-21 ENCOUNTER — Other Ambulatory Visit: Payer: Medicare Other

## 2016-03-21 DIAGNOSIS — E785 Hyperlipidemia, unspecified: Secondary | ICD-10-CM | POA: Diagnosis not present

## 2016-03-22 LAB — LIPID PANEL
CHOL/HDL RATIO: 2.9 ratio (ref <=5.0)
Cholesterol: 139 mg/dL (ref 125–200)
HDL: 48 mg/dL (ref >=40)
LDL CALC: 72 mg/dL (ref <130)
Triglycerides: 93 mg/dL (ref <150)
VLDL: 19 mg/dL (ref <30)

## 2016-03-25 ENCOUNTER — Telehealth: Payer: Self-pay | Admitting: Internal Medicine

## 2016-03-25 NOTE — Telephone Encounter (Signed)
LAB RESULTS GIVEN BY CHELLEY

## 2016-03-25 NOTE — Telephone Encounter (Signed)
Pt would like Lab results from last week.

## 2016-03-27 DIAGNOSIS — R5383 Other fatigue: Secondary | ICD-10-CM | POA: Diagnosis not present

## 2016-03-27 DIAGNOSIS — M109 Gout, unspecified: Secondary | ICD-10-CM | POA: Diagnosis not present

## 2016-03-27 DIAGNOSIS — Z7984 Long term (current) use of oral hypoglycemic drugs: Secondary | ICD-10-CM | POA: Diagnosis not present

## 2016-03-27 DIAGNOSIS — I10 Essential (primary) hypertension: Secondary | ICD-10-CM | POA: Diagnosis not present

## 2016-03-27 DIAGNOSIS — E1165 Type 2 diabetes mellitus with hyperglycemia: Secondary | ICD-10-CM | POA: Diagnosis not present

## 2016-05-19 DIAGNOSIS — R479 Unspecified speech disturbances: Secondary | ICD-10-CM | POA: Diagnosis not present

## 2016-05-19 DIAGNOSIS — R200 Anesthesia of skin: Secondary | ICD-10-CM | POA: Diagnosis not present

## 2016-05-19 DIAGNOSIS — R531 Weakness: Secondary | ICD-10-CM | POA: Diagnosis not present

## 2016-05-19 DIAGNOSIS — R209 Unspecified disturbances of skin sensation: Secondary | ICD-10-CM | POA: Diagnosis not present

## 2016-05-19 DIAGNOSIS — G459 Transient cerebral ischemic attack, unspecified: Secondary | ICD-10-CM | POA: Diagnosis not present

## 2016-05-19 DIAGNOSIS — E119 Type 2 diabetes mellitus without complications: Secondary | ICD-10-CM | POA: Diagnosis not present

## 2016-05-19 DIAGNOSIS — R29810 Facial weakness: Secondary | ICD-10-CM | POA: Diagnosis not present

## 2016-05-19 DIAGNOSIS — R51 Headache: Secondary | ICD-10-CM | POA: Diagnosis not present

## 2016-05-20 DIAGNOSIS — Z7984 Long term (current) use of oral hypoglycemic drugs: Secondary | ICD-10-CM | POA: Diagnosis not present

## 2016-05-20 DIAGNOSIS — R4781 Slurred speech: Secondary | ICD-10-CM | POA: Diagnosis present

## 2016-05-20 DIAGNOSIS — R209 Unspecified disturbances of skin sensation: Secondary | ICD-10-CM | POA: Diagnosis not present

## 2016-05-20 DIAGNOSIS — I071 Rheumatic tricuspid insufficiency: Secondary | ICD-10-CM | POA: Diagnosis not present

## 2016-05-20 DIAGNOSIS — I2510 Atherosclerotic heart disease of native coronary artery without angina pectoris: Secondary | ICD-10-CM | POA: Diagnosis present

## 2016-05-20 DIAGNOSIS — R29810 Facial weakness: Secondary | ICD-10-CM | POA: Diagnosis present

## 2016-05-20 DIAGNOSIS — K219 Gastro-esophageal reflux disease without esophagitis: Secondary | ICD-10-CM | POA: Diagnosis present

## 2016-05-20 DIAGNOSIS — Z8249 Family history of ischemic heart disease and other diseases of the circulatory system: Secondary | ICD-10-CM | POA: Diagnosis not present

## 2016-05-20 DIAGNOSIS — E785 Hyperlipidemia, unspecified: Secondary | ICD-10-CM | POA: Diagnosis not present

## 2016-05-20 DIAGNOSIS — E119 Type 2 diabetes mellitus without complications: Secondary | ICD-10-CM | POA: Diagnosis not present

## 2016-05-20 DIAGNOSIS — Z85828 Personal history of other malignant neoplasm of skin: Secondary | ICD-10-CM | POA: Diagnosis not present

## 2016-05-20 DIAGNOSIS — I6789 Other cerebrovascular disease: Secondary | ICD-10-CM | POA: Diagnosis not present

## 2016-05-20 DIAGNOSIS — I639 Cerebral infarction, unspecified: Secondary | ICD-10-CM | POA: Diagnosis not present

## 2016-05-20 DIAGNOSIS — Z7982 Long term (current) use of aspirin: Secondary | ICD-10-CM | POA: Diagnosis not present

## 2016-05-20 DIAGNOSIS — Z85819 Personal history of malignant neoplasm of unspecified site of lip, oral cavity, and pharynx: Secondary | ICD-10-CM | POA: Diagnosis not present

## 2016-05-20 DIAGNOSIS — M109 Gout, unspecified: Secondary | ICD-10-CM | POA: Diagnosis present

## 2016-05-20 DIAGNOSIS — Z823 Family history of stroke: Secondary | ICD-10-CM | POA: Diagnosis not present

## 2016-05-20 DIAGNOSIS — I10 Essential (primary) hypertension: Secondary | ICD-10-CM | POA: Diagnosis not present

## 2016-05-20 DIAGNOSIS — L821 Other seborrheic keratosis: Secondary | ICD-10-CM | POA: Diagnosis present

## 2016-05-20 DIAGNOSIS — R200 Anesthesia of skin: Secondary | ICD-10-CM | POA: Diagnosis present

## 2016-05-20 DIAGNOSIS — G459 Transient cerebral ischemic attack, unspecified: Secondary | ICD-10-CM | POA: Diagnosis not present

## 2016-05-20 NOTE — H&P (Signed)
History and Physical    [facility]  Nachelle Negrette W. Maisie Fushomas, MD  Admission Date: 05/20/2016    PRIMARY CARE PHYSICIAN:  Dr. Lupita RaiderKimberlee Shaw of [facility] in  [place], [place].    CHIEF COMPLAINT:  Left-sided numbness and tingling.    HISTORY OF PRESENT ILLNESS:  This is a 75 year old white male with  history of coronary artery disease, hypertension, diabetes and  hyperlipidemia who presents to the ER after an episode of left-sided  numbness, tingling and slurred speech earlier this evening.  The  patient said he was eating dinner sometime around 8:00 this evening  when he noticed a funny feeling on the left side of his body, to  include his face and upper extremity.  He describes it as a numbness  and tingling, and felt like he had difficulty of control over his left  hand.  He had a little bit of slurred speech, and his family was  worried about a left-sided facial droop, and he was brought to the ER.   The patient said the episode lasted for about 15 minutes and then  resolved, and he was back to his baseline by the time he arrived in  the ER.  The symptoms have not recurred.  He denies any history of TIA  or stroke-like symptoms in the past.  He denied any vision changes,  headache, chest pain, shortness of breath, or diaphoresis.  In the ER  his vitals are stable.  A CT of the head was negative, but there was  concern for a TIA.  The patient was given aspirin, and the hospitalist  service was contacted for admission.    REVIEW OF SYSTEMS:  The patient denies any fever, chills, headache,  lightheadedness, vision change, including double vision or blurry  vision.  No difficulty swallowing.  He thinks he did have some  dysarthria during the episode that has now resolved.  No chest pain.   No shortness of breath.  No palpitations.  No abdominal pain.  No  nausea or vomiting.  No swelling in his extremities.  No arthralgias  or myalgias.    PAST MEDICAL HISTORY:  1.  Coronary artery disease.  2.  Diabetes  mellitus type 2.  3.  Hypertension.  4.  Hyperlipidemia.  5.  GERD.  6.  Gout.  7.  History of SVT in the past.    PAST SURGICAL HISTORY:  1.  Skin cancer removal of the lower lip.  2.  SVT ablation.    SOCIAL HISTORY:  [place] lives in [place], [place], is down here  visiting his daughter for the weekend.  No tobacco or alcohol use.    FAMILY HISTORY:  Mother had a stroke.  Father had heart disease.    ALLERGIES:  No known drug allergies.    HOME MEDICATIONS:  1.  Lipitor, unknown dose.  2.  Prilosec daily.  3.  Metformin 500 mg b.i.d.  4.  Aspirin 81 mg daily.  5.  Micardis HCT, unknown dose.  6.  Allopurinol, unknown dose.    PHYSICAL EXAMINATION:  VITAL SIGNS:  Temperature 36.5, pulse 60,  respirations 16, blood pressure 125/76, O2 sats 99% on room air.   GENERAL:  This is a well-appearing white male in no acute distress.   HEENT:  Head is normocephalic, atraumatic.  Oropharynx is moist and  pink.  NECK:  Supple.  No carotid bruit.  HEART:  Regular rate and  rhythm without murmur.  LUNGS:  Clear  bilaterally.  No crackles or  wheezes.  ABDOMEN:  Soft, nontender, with positive bowel sounds.   EXTREMITIES:  Without edema.  NEUROLOGIC:  Cranial nerves II-XII are  intact.  Strength is 5/5 in the bilateral upper and lower extremities.   Sensation is intact to light touch throughout.  SKIN:  The patient  has multiple seborrheic keratoses over his back.  No rashes or  bruises.    LABORATORY DATA:  White count 4.2, hemoglobin 11.5, platelets 193.   Sodium 140, potassium 4.1, chloride 101, bicarbonate 26, BUN 32,  creatinine 1.3, glucose 121, calcium 9.3.    IMAGING STUDIES:  1.  EKG shows sinus rhythm, no ST changes.  2.  CT of the head without contrast shows findings consistent with  mild small-vessel ischemic change, but no acute process.    ASSESSMENT AND PLAN:  A 75 year old male with history of coronary  artery disease, diabetes, hypertension, hyperlipidemia, who presents  with left-sided tingling and facial  droop.  1.  Left-sided tingling and facial droop:  This is most concerning for  transient ischemic attack, as the symptoms remitted after about 15  minutes and have not recurred.  The patient is at risk for a  cerebrovascular event, given his known coronary artery disease,  hypertension, diabetes and hyperlipidemia.  He will be admitted to the  hospitalist service under observation, and we will have him monitored  on telemetry, and we will check an MRI of the brain without contrast  and also carotid Dopplers.  He does have a history of supraventricular  tachycardia, but is currently in sinus rhythm, so we will hold off on  an echo at this point in time.  His Micardis will be held to allow for  permissive hypertension.  He has been given an aspirin, and we will  increase his statin to high dose.  2.  Diabetes mellitus type 2:  The patient's metformin will be held,  and he will be placed on sliding scale insulin, and we will check an  A1c in the morning.  3.  Hyperlipidemia:  The patient's Lipitor will be bumped up to  high-dose statin.  I am unsure of his home dose at this point in time.   We will check lipids in the morning.  4.  Hypertension:  We will allow for permissive hypertension in the  setting of an acute ischemic event, and Micardis will be held.  5.  Prophylaxis will be with Lovenox subcutaneously for deep venous  thrombosis prophylaxis.      Gregary Cromer, MD  TR: *n DD: 05/20/2016 01:41 TD: 05/20/2016 03:23 Job#: [id number]  \\[id number]\\DOC#: [id number]  \\[id number]\\      cc:  Lupita Raider MD  Signature Line    Electronically Signed on 05/20/2016 06:28 AM EDT  ________________________________________________  Lenice Pressman WHITNEY

## 2016-05-20 NOTE — Progress Notes (Signed)
PT Time Spent with Patient Acute/OP-Text       PT Time Spent With Patient Outpatient/Acute Entered On:  05/20/2016 8:53 EDT    Performed On:  05/20/2016 8:52 EDT by REED, PT, WHITNEY L               Time Spent With Patient   PT Individual Eval Time, Low Complexity :   0 minutes   PT Total Individual Min :   0    PT Treatment Time Comment :    PT consulted with patient and nsg. PT is not needed at this time.   PT Total Untimed Min Ac/Outp :   0    PT Total Treatment Time Ac/Outp :   0    REED, PT, WHITNEY L - 05/20/2016 8:52 EDT

## 2016-05-20 NOTE — Nursing Note (Signed)
Adult Patient History Form-Text       Adult Patient History Entered On:  05/20/2016 2:01 EDT    Performed On:  05/20/2016 1:56 EDT by Lucille Passy               General Info   Preferred Name :   Blake Jones   Admitted From :   [facility]   Mode of Arrival on Unit :   Stretcher   Information Given By :   Self   Primary Language :   English   Pregnancy Status :   N/A   Has the patient received chemotherapy or biotherapy within the last 48 hours? :   No   Is the patient currently (2-3 days) receiving radiation treatment? :   No   Lucille Passy - 05/20/2016 1:56 EDT   Problem History   (As Of: 05/20/2016 02:01:17 EDT)   Problems(Active)    Ablation (SNOMED CT  :[id number] )  Name of Problem:   Ablation ; Recorder:   Lucille Passy; Confirmation:   Confirmed ; Classification:   Medical ; Code:   [id number] ; Contributor System:   PowerChart ; Last Updated:   05/20/2016 1:58 EDT ; Life Cycle Date:   05/20/2016 ; Life Cycle Status:   Active ; Vocabulary:   SNOMED CT   ; Comments:        05/20/2016 1:58 - Charolett Bumpers K  cardiac ablation for SVT 2016      Diabetes (SNOMED CT  :[id number] )  Name of Problem:   Diabetes ; Recorder:   LUCAS, RN, LENNA R; Confirmation:   Confirmed ; Classification:   Medical ; Code:   [id number] ; Contributor System:   Dietitian ; Last Updated:   05/19/2016 21:20 EDT ; Life Cycle Date:   05/19/2016 ; Life Cycle Status:   Active ; Vocabulary:   SNOMED CT        GERD (SNOMED CT  :AZEGxwEmL07HeZuEwKgAAg )  Name of Problem:   GERD ; Recorder:   LUCAS, RN, [employer] R; Confirmation:   Confirmed ; Classification:   Medical ; Code:   AZEGxwEmL07HeZuEwKgAAg ; Contributor System:   Dietitian ; Last Updated:   05/19/2016 21:20 EDT ; Life Cycle Date:   05/19/2016 ; Life Cycle Status:   Active ; Vocabulary:   SNOMED CT        Gout (SNOMED CT  :BgCTWgDjf6PgrEVqCwsLDQ )  Name of Problem:   Gout ; Recorder:   LUCAS, RN, LENNA R; Confirmation:   Confirmed ;  Classification:   Medical ; Code:   BgCTWgDjf6PgrEVqCwsLDQ ; Contributor System:   Dietitian ; Last Updated:   05/19/2016 21:20 EDT ; Life Cycle Date:   05/19/2016 ; Life Cycle Status:   Active ; Vocabulary:   SNOMED CT          Family History   Family History   (As Of: 05/20/2016 02:01:17 EDT)     Allergy   (As Of: 05/20/2016 02:01:17 EDT)   Allergies (Active)   No Known Medication Allergies  Estimated Onset Date:   Unspecified ; Created By:   Samuel Bouche, RN, LENNA R; Reaction Status:   Active ; Category:   Drug ; Substance:   No Known Medication Allergies ; Type:   Allergy ; Updated By:   Darleene Cleaver; Reviewed Date:   05/20/2016 1:58 EDT        Immunizations   Immunizations Current :   Yes   Last  Tetanus :   Less than 5 years   Influenza Vaccine Status :   Non-influenza season (before Oct 1st and after Mar 31st)   Influenza Vaccine Refused :   Yes   Pneumococcal Vaccine Status :   Received prior to admission   Pneumococcal Vaccine Refused :   Yes   Lucille Passy - 05/20/2016 1:56 EDT   ID Risk Screen   Patient Recent Travel History :   No recent travel   Family Member Travel History :   No recent travel   Health and safety inspector, Higher education careers adviser for MERS :   N/A   MRSA/VRE Screening :   No   Does the Patient Have any of the Following Conditions That Compromise the Immune System :   None   Lucille Passy - 05/20/2016 1:56 EDT   C. diff Screen   Have you had 3 or more loose/watery stool in 24 hours? 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, performed at the time of other cardiac procedure(s), limited (eg, modified maze procedure) (List separately in addition to code for primary procedure) ; Procedure Minutes:   0 ; Last Reviewed Dt/Tm:   05/20/2016 01:59:23 EDT             Anesthesia/Sedation   Anesthesia History :   Prior general anesthesia   Anesthesia Reaction :   None   Moderate Sedation History :   Prior sedation for procedure   Previous Problem With Sedation :   None   Lucille Passy - 05/20/2016 1:56 EDT   Transfusion/Bloodless Med   Transfusion History :   No prior transfusion   Lucille Passy - 05/20/2016 1:56 EDT   Nutrition   Home Diet :   Regular   Appetite :   Good   Usual Weight :   81.8 kg(Converted to: 180 lb 5 oz, 180.34 lb, 2,885.41 oz)    Unintentional Weight Change Greater Than 10 lbs in the Last 6 Months :   No   Lucille Passy - 05/20/2016 1:56 EDT   Functional   Cognitive Function Concerns Prior to Admission :   None reported   Fear of Falling :   No   Ability to Ambulate Prior to Admission :   Independent   Ability to Move in Bed Prior to Admission :   Independent   ADLs :   Independent   Lucille Passy - 05/20/2016 1:56 EDT   Living and Resources   Living Situation :   Home independently   Lines/Tubes Present on Admission :   None   Lucille Passy - 05/20/2016 1:56 EDT   Social History   Social History   (As Of: 05/20/2016 02:01:17 EDT)   Tobacco:        Never smoker, Cigarettes   (Last Updated: 05/19/2016 21:23:38 EDT by Samuel Bouche, RN, LENNA R)            Sexual Assault/Domestic Violence Screen   Have You Ever Been Emotionally or Physically Abused by Your Partner :  No   Have You Been Physically Hurt by Someone Within the Past Year :   No   Within the Last Year, Has Anyone Forced You to Have Sexual Activity :   No   Lucille PassyBegley,  Amanda K - 05/20/2016 1:56 EDT   Spiritual   Do You Receive Comfort From Spiritual Practices :   Yes   Religious Preference :   [facility]   Hospital Clergy to Visit :   No   Spiritual Advisor/Minister to be Notified :   No   Lucille PassyBegley,  Amanda K - 05/20/2016 1:56 EDT   Advance Directive   *Advance Directive :   No   Patient Wishes to Receive Further Information on Advance Directives :   No   Lucille PassyBegley,  Amanda K - 05/20/2016 1:56 EDT    Educ Needs   Barriers to Learning :   None evident   Lucille PassyBegley,  Amanda K - 05/20/2016 1:56 EDT   DC Needs   Discharge To :   Home independently   Lines/Tubes Anticipated After Discharge :   None   Professional Skilled Services :   No Needs   Needs Assistance with Transportation :   No   Needs Assistance At Home Upon Discharge :   No   Lucille PassyBegley,  Amanda K - 05/20/2016 1:56 EDT

## 2016-05-20 NOTE — Nursing Note (Signed)
Basic Admission Information - Text       Basic Admission Information Adult Entered On:  05/20/2016 1:37 EDT    Performed On:  05/20/2016 1:36 EDT by Charolett BumpersBegley,  Amanda K               Vital Signs   Temperature Oral :   36.5 degC   Peripheral Pulse Rate :   60 bpm   Respiratory Rate :   16 br/min   Systolic/ Diastolic BP :   125 mmHg   Diastolic Blood Pressure :   76 mmHg   SpO2 :   99 %   O2 Therapy :   Room air   Lucille PassyBegley,  Amanda K - 05/20/2016 1:36 EDT

## 2016-05-20 NOTE — Progress Notes (Signed)
Anticoagulation Monitoring - Text       Pharmacy Anticoagulation Monitoring Entered On:  05/20/2016 17:37 EDT    Performed On:  05/20/2016 17:36 EDT by Barrett HenleBAIRD,  MELISSA A               Anticoagulation Monitoring Chart   Indication for Treatment :   VTE prophylaxis   BAIRD,  MELISSA A - 05/20/2016 17:36 EDT   Date :    05/20/2016 EDT              Medication(s) :    ENOX              Current Dose :    40MG  Q24H              Notes :    NNL     CRC; >30              Initials :    MB                BAIRD,  MELISSA A - 05/20/2016 17:36 EDT

## 2016-05-20 NOTE — Case Communication (Signed)
CM Discharge Planning Assessment - Text       CM Discharge Planning Assessment Entered On:  05/20/2016 14:12 EDT    Performed On:  05/20/2016 13:56 EDT by [name],  [name]               Home Environment   Living Environment :   No Living Environment Information Available     Affect/Behavior :   Appropriate   Living Situation :   Home independently, Home with family support   Outside Facility Information :   PCP- Lupita RaiderKimberlee Shaw    Rx- [facility]  [place]   Balcon[place] HeightsSTEVENS,  [name] - 05/20/2016 13:56 EDT   Home Environment   Home Equipment Rehab :   Other: Glucometer   ADLs :   Independent   [name],  [name] - 05/20/2016 13:56 EDT   Discharge Needs I   Previously Documented Discharge Needs :   DISCHARGE PLAN/NEEDS:  Discharge To, Anticipated: Home independently - Lucille PassyBegley,  Amanda K - 05/20/16 01:56:00  Needs Assistance with Transportation: No - Lucille PassyBegley,  Amanda K - 05/20/16 01:56:00  EQUIPMENT/TREATMENT NEEDS:  Needs Assistance at Home Upon Discharge: No - Lucille PassyBegley,  Amanda K - 05/20/16 01:56:00  Professional Skilled Services: No Needs - Lucille PassyBegley,  Amanda K - 05/20/16 01:56:00       Previously Documented Benefits Information :   No discharge data available.     Discharge To :   Home independently, Home with family support   Home Caregiver Name/Relationship :   Nolene EbbsLinda Shankman (Spouse)  [phone]   CM Progress Note :   05/20/16: 75 y/o w/admission dx of r/o CVA.  Cm met w/pt who is a/o. [place] Pt states that he is followed byPCP, [name] and [facility]. Pt has a glucometer used at home, I of ADL's and is retired. Pt will return home at discharge. Cm will cont. to monitor and assist w/disharge needs. (FWS).     [name],  [name] - 05/20/2016 13:56 EDT   Discharge Needs II   Professional Skilled Services :   No Needs   Needs Assistance with Transportation :   No   Needs Assistance at Home Upon Discharge :   No   Discharge Planning Time Spent :   30  minutes   [name],  [name] - 05/20/2016 13:56 EDT   Benefits   Insurance Information :   Medicare, Other: Tricare for Life   Andria MeuseSTEVENS,  [name] - 05/20/2016 13:56 EDT   Advance Directive   *Advance Directive :   No   Patient Wishes to Receive Further Information on Advance Directives :   No   Andria MeuseSTEVENS,  [name] - 05/20/2016 13:56 EDT

## 2016-05-21 NOTE — Discharge Summary (Signed)
 Inpatient Patient Summary       ;        [facility]  [address]  [place], [place] [address]  [phone]  Patient Discharge Instructions     Name: Blake Jones, Blake Jones  Current Date: 05/21/2016 13:13:25  DOB: 07-01-1941 MRN: [id number] FIN: [id number]%>[phone]  Patient Address: [address] [place] [address]  Patient Phone: [phone]  Primary Care Provider:  Name: Pcp, None  Phone:    Immunizations Provided:       Discharge Diagnosis: 1:Acute cerebrovascular accident (CVA); 2:DM II (diabetes mellitus, type II), controlled; 3:Hyperlipidemia; 4:Benign essential HTN  Discharged To: ANTICIPATED%>  Home Treatments: TREATMENTS, ANTICIPATED%>  Devices/Equipment: EQUIPMENT REHAB%>Walker - Rollator, Other: Glucometer  Post Hospital Services: HOSPITAL SERVICES%>  Professional Skilled Services: SKILLED SERVICES%>  Therapist, sports and Community Resources:                SERV AND COMM RES, ANTICIPATED%>  Mode of Discharge Transportation: TRANSPORTATION%>Private vehicle  Discharge Orders:         Discharge Patient 05/21/16 12:37:00 EDT, Discharge Home/Self Care         Comment:      Medications   During the course of your visit, your medication list was updated with the most current information. The details of those changes are reflected below:         New Medications  Other Medications  aspirin (aspirin 325 mg oral delayed release tablet) 1 Tabs Oral (given by mouth) every day.  Last Dose:____________________  Medications That Were Updated - Follow Current Instructions  Printed Prescriptions  Current atorvastatin (Lipitor 40 mg oral tablet) 1 Tabs Oral (given by mouth) every day. Refills: 0.  Last Dose:____________________    Medications that have not changed  Printed Prescriptions  telmisartan-hydrochlorothiazide (Micardis HCT 80 mg-12.5 mg oral tablet) every day.  Last Dose:____________________  Other Medications  allopurinol (allopurinol 100 mg oral tablet) 1 Tabs Oral (given by mouth) every day.  Last  Dose:____________________  metFORMIN 500 Milligram Oral (given by mouth) every day.  Last Dose:____________________  omeprazole 20 Milligram Oral (given by mouth) every day.  Last Dose:____________________         [facility] would like to thank you for allowing us  to assist you with your healthcare needs. The following includes patient education materials and information regarding your injury/illness.     Blake Jones has been given the following list of follow-up instructions, prescriptions, and patient education materials:  Follow-up Instructions             With: Address: When:   Vinie Maxcy, MD [address]  [place], [place]  [address]  [phone] Within 1 week   Comments:   [facility]   Please call and schedule a 1 WEEK hospital appointment with Dr. Maxcy when you return to [place] .          With: Address: When:   Joen Gentry, MD [address]  [place], [place]  [address]  [phone]    Comments:   Please call and schedule a 1 WEEK hospital follow up appointment with Dr. Gentry when you return to [place] .       With: Address: When:   Please contact Dr. Sherwin nurse, [place], for any questions or concerns that you may have about your recent hospital admission.  Hospitalist Service - Phone: [phone]         With: Address: When:   None Pcp  With: Address: When:   Cardiology Dr. Mae  Within 1 week                       It is important to always keep an active list of medications available so that you can share with other providers and manage your medications appropriately. As an additional courtesy, we are also providing you with your final active medications list that you can keep with you.           allopurinol (allopurinol 100 mg oral tablet) 1 Tabs Oral (given by mouth) every day.  aspirin (aspirin 325 mg oral delayed release tablet) 1 Tabs Oral (given by mouth) every day.  atorvastatin (Lipitor 40 mg oral  tablet) 1 Tabs Oral (given by mouth) every day. Refills: 0.  metFORMIN 500 Milligram Oral (given by mouth) every day.  omeprazole 20 Milligram Oral (given by mouth) every day.  telmisartan-hydrochlorothiazide (Micardis HCT 80 mg-12.5 mg oral tablet) every day.      Take only the medications listed above. Contact your doctor prior to taking any medications not on this list.        Discharge instructions, if any, will display below     Instructions for Diet: INSTRUCTIONS FOR DIET%>  Instructions for Supplements: SUPPLEMENT INSTRUCTIONS%>   Instructions for Activity: INSTRUCTIONS FOR ACTIVITY%>   Instructions for Wound Care: INSTRUCTIONS FOR WOUND CARE%>     Medication leaflets, if any, will display below         Patient education materials, if any, will display below        Stroke (Completed)      You have had a mild stroke, also called CVA, or cerebrovascular accident. This is caused by a loss of blood flow to part of your brain. This can occur when a blood clot forms inside the carotid artery (main artery from the heart to the brain) or inside the heart (as with atrial fibrillation, an irregular heart rhythm). When the clot travels to the brain, it can lodge in a blood vessel and block blood flow. The other common cause of stroke is a gradual narrowing of the arteries in the brain due to atherosclerosis (hardening of the arteries).   Once you have had a stroke, you are at risk of having another. Therefore, be sure to follow up with your doctor for further evaluation and treatment. This may include an ultrasound of the arteries in your neck and an evaluation of your heart. If problems are found, your doctor will recommend treatment with medications and/or procedures.   Medications to reduce your chance of having another stroke include those that prevent blood clots, such as antiplatelet medicines (aspirin and Plavix) and anticoagulant medicines (warfarin).   Home Care:    Rest at home and avoid exertion for the next  few days.    If your doctor has prescribed antiplatelet medication, take it as directed.   Follow Up:   Call your doctor for an appointment in the next few days for a repeat exam. Additional tests may be needed. If you had an x-ray, CT scan, MRI scan, or ECG (electrocardiogram), it will be reviewed by a specialist. You will be notified of any new findings that will affect your care.   Call 911 Or Emergency Services   if any of the following occur:    Any of your stroke symptoms worsen    New problems with speech, vision, walking, or weakness or numbness on one side of  your body    Severe headache, fainting spell, dizziness or seizure    Chest pain or shortness of breath      2000-2015 [employer] [address] [place], [place] [address]. All rights reserved. This information is not intended as a substitute for professional medical care. Always follow your healthcare professional's instructions.               IS IT A STROKE?  Act FAST and Check for these signs:     FACE                  Does the face look uneven?     ARM                    Does one arm drift down?     SPEECH             Does their speech sound strange?     TIME                   Call 9-1-1 at any sign of stroke  Heart Attack Signs  Chest discomfort: Most heart attacks involve discomfort in the center of the chest and lasts more than a few minutes, or goes away and comes back. It can feel like uncomfortable pressure, squeezing, fullness or pain.  Discomfort in upper body: Symptoms can include pain or discomfort in one or both arms, back, neck, jaw or stomach.  Shortness of breath: With or without discomfort.  Other signs: Breaking out in a cold sweat, nausea, or lightheaded.  Remember, MINUTES DO MATTER. If you experience any of these heart attack warning signs, call 9-1-1 to get immediate medical attention!             Yes - Patient/Family/Caregiver demonstrates understanding of instructions given  ______________________________  ___________ ___________________ ___________  Patient/Family/ Caregiver Signature Date/Time          Provider Signature Date/Time

## 2016-05-21 NOTE — Progress Notes (Signed)
 Inpatient PT Examination - Text       Inpatient PT Examination Entered On:  05/21/2016 10:14 EDT    Performed On:  05/21/2016 10:03 EDT by STORHOLT, PT, CASSIE W               Reason for Treatment   Subjective Statement :   Pt found supine, agreeable to OOB c PT. RN cleared for session. This is only the second time I've ever been in the hospital, & I'm 5     *Reason for Referral :   Pt admit c L side N/T. Brain MRI shows CVA. Pt reports his symptoms have now resolved, only c/o dec sensation lower lip from previous skin Ca removal procedure.    PMH: CAD, HTN, HLD, gout, skin Ca     Pt reports he was ind amb s AD prior to admit. He lives c wife & adult step-son who both work during the day. House is 2 levels c 5 STE. He owns rollator but has never used.     *Chief Complaint :   denies pain     STORHOLT, PT, CASSIE W - 05/21/2016 10:03 EDT   General Info   Physical Therapy Orders :   PT Evaluation and Treatment Acute - 05/20/16 1:33:00 EDT, Stop date 05/20/16 1:33:00 EDT     Precautions RTF :    Hospitalist Team Color, 05/21/16 9:18:00 EDT, Landy Felt Indicator, Ordered   Notify Provider Laboratory Results, 05/20/16 1:34:00 EDT, Call MD for BG greater than 350 x 2 or greater than 400 x 1 on any schedule, Ordered   Communication Order, 05/20/16 1:33:00 EDT, RD may manage/modify diet order and/or enteral nutrition per approved MNT protocol, 05/20/16 1:33:00 EDT, 05/20/16 1:33:00 EDT, Ordered   Notify Provider, 05/20/16 1:33:00 EDT, Failed nursing swallow  screen, 05/20/16 1:33:00 EDT, 05/20/16 1:33:00 EDT, Ordered   Notify Provider, 05/20/16 1:33:00 EDT, Notify physician for any increased deficits., 05/20/16 1:33:00 EDT, 05/20/16 1:33:00 EDT, Ordered   Notify Provider Laboratory Results, 05/20/16 1:33:00 EDT, Blood glucose less than 70 or greater than 200 mg/dL (x 2 consecutive measurements), Ordered   Notify Provider Vital Signs, 05/20/16 1:33:00 EDT, DBP greater than 120, Ordered   Notify Provider Vital Signs,  05/20/16 1:33:00 EDT, SBP greater than 220 or less than 110, Ordered   Notify Provider Vital Signs, 05/20/16 1:33:00 EDT, per 4 hours, UO less than , Ordered   Notify Provider Vital Signs, 05/20/16 1:33:00 EDT, O2 sat less than 92, Ordered   Notify Provider Vital Signs, 05/20/16 1:33:00 EDT, DBP greater than 90, DBP less than 40, Ordered   Notify Provider Vital Signs, 05/20/16 1:33:00 EDT, RR greater than 24, RR less than 8, Ordered   Notify Provider Vital Signs, 05/20/16 1:33:00 EDT, Temp greater than 38, Ordered   Notify Provider Vital Signs, 05/20/16 1:33:00 EDT, HR greater than 120, HR less than 50, Ordered   Notify Provider Vital Signs, 05/20/16 1:33:00 EDT, SBP greater than 180, SBP less than 90, Ordered   Notify Rapid Response Team, 05/20/16 1:33:00 EDT, For concerns regarding patient condition & notify MD, 05/20/16 1:33:00 EDT, 05/20/16 1:33:00 EDT, Ordered   Notify Rapid Response Team, 05/20/16 1:33:00 EDT, For concerns regarding patient condition & notify MD, 05/20/16 1:33:00 EDT, 05/20/16 1:33:00 EDT, Ordered     Orientation Assessment :   Oriented x 4   Affect/Behavior :   Appropriate   Basic Command Following :   Intact   Safety/Judgment :   Intact   Pain Present :  No actual or suspected pain   STORHOLT, PT, CASSIE W - 05/21/2016 10:03 EDT   Problem List   (As Of: 05/21/2016 10:14:19 EDT)   Problems(Active)    Ablation (SNOMED CT  :800484982 )  Name of Problem:   Ablation ; Recorder:   Betti Alan POUR; Confirmation:   Confirmed ; Classification:   Medical ; Code:   800484982 ; Contributor System:   PowerChart ; Last Updated:   05/20/2016 1:58 EDT ; Life Cycle Date:   05/20/2016 ; Life Cycle Status:   Active ; Vocabulary:   SNOMED CT   ; Comments:        05/20/2016 1:58 - Betti Alan K  cardiac ablation for SVT 2016      Diabetes (SNOMED CT  :[id number] )  Name of Problem:   Diabetes ; Recorder:   LUCAS, RN, LENNA R; Confirmation:   Confirmed ; Classification:   Medical  ; Code:   [id number] ; Contributor System:   PowerChart ; Last Updated:   05/19/2016 21:20 EDT ; Life Cycle Date:   05/19/2016 ; Life Cycle Status:   Active ; Vocabulary:   SNOMED CT        GERD (SNOMED CT  :[id number] )  Name of Problem:   GERD ; Recorder:   LUCAS, RN, [employer] R; Confirmation:   Confirmed ; Classification:   Medical ; Code:   [id number] ; Contributor System:   PowerChart ; Last Updated:   05/19/2016 21:20 EDT ; Life Cycle Date:   05/19/2016 ; Life Cycle Status:   Active ; Vocabulary:   SNOMED CT        Gout (SNOMED CT  :BgCTWgDjf6PgrEVqCwsLDQ )  Name of Problem:   Gout ; Recorder:   LUCAS, RN, LENNA R; Confirmation:   Confirmed ; Classification:   Medical ; Code:   [id number] ; Contributor System:   PowerChart ; Last Updated:   05/19/2016 21:20 EDT ; Life Cycle Date:   05/19/2016 ; Life Cycle Status:   Active ; Vocabulary:   SNOMED CT          Diagnoses(Active)    Acute cerebrovascular accident (CVA)  Date:   05/20/2016 ; Diagnosis Type:   Discharge ; Confirmation:   Confirmed ; Clinical Dx:   Acute cerebrovascular accident (CVA) ; Classification:   Medical ; Clinical Service:   Non-Specified ; Code:   ICD-10-CM ; Probability:   0 ; Diagnosis Code:   I63.9      Benign essential HTN  Date:   05/20/2016 ; Diagnosis Type:   Discharge ; Confirmation:   Confirmed ; Clinical Dx:   Benign essential HTN ; Classification:   Medical ; Clinical Service:   Non-Specified ; Code:   ICD-10-CM ; Probability:   0 ; Diagnosis Code:   I10      DM II (diabetes mellitus, type II), controlled  Date:   05/20/2016 ; Diagnosis Type:   Discharge ; Confirmation:   Confirmed ; Clinical Dx:   DM II (diabetes mellitus, type II), controlled ; Classification:   Medical ; Clinical Service:   Non-Specified ; Code:   ICD-10-CM ; Probability:   0 ; Diagnosis Code:   E11.9      Hyperlipidemia  Date:   05/20/2016 ; Diagnosis Type:   Discharge ; Confirmation:   Confirmed ; Clinical Dx:    Hyperlipidemia ; Classification:   Medical ; Clinical Service:   Non-Specified ; Code:   ICD-10-CM ; Probability:   0 ; Diagnosis Code:  E78.5      Muscle weakness (generalized)  Date:   05/21/2016 ; Diagnosis Type:   Other ; Confirmation:   Differential ; Clinical Dx:   Muscle weakness (generalized) ; Classification:   Interdisciplinary ; Clinical Service:   Non-Specified ; Code:   ICD-10-CM ; Probability:   0 ; Diagnosis Code:   M62.81        Home Environment   Living Environment :   Home Environment  Devices/Equipment at Home:  Other: Glucometer  Performed By:  STEVENS,  [name] 05/20/2016  Living Situation:  Home independently, Home with family support  Performed By:  STEVENS,  [name] 05/20/2016     Living Situation :   Home independently, Home with family support   Lives With :   Family   Lives In :   Multilevel home   [place], [place], CASSIE W - 05/21/2016 10:03 EDT   Stairs     Outside Stairs          Number of Stairs :    5                 STORHOLT, PT, CASSIE W - 05/21/2016 10:03 EDT         Home Setup   Primary Bedroom :   2nd floor   Primary Bathroom :   2nd floor   STORHOLT, PT, CASSIE W - 05/21/2016 10:03 EDT   Home Environment II   Living Environment :   No Living Environment Information Available     Devices/Equipment :   Nutritional therapist, Other: Glucometer   STORHOLT, PT, CASSIE W - 05/21/2016 10:03 EDT   Prior Functional Status   ADL :   Independent   Mobility :   Independent   STORHOLT, PT, CASSIE W - 05/21/2016 10:03 EDT   LE Range/Strength   LE Overall Range of Motion Grid   Left Lower Extremity Active Range :   Within functional limits   Right Lower Extremity Active Range :   Within functional limits   STORHOLT, PT, CASSIE W - 05/21/2016 10:03 EDT   Left Lower Extremity Strength Grid   Hip Flexion :   4+   Knee Extension :   5   Ankle Dorsiflexion :   5   Ankle Plantarflexion :   5   STORHOLT, PT, CASSIE W - 05/21/2016 10:03 EDT   Right Lower Extremity Strength Grid   Hip Flexion :   4+   Knee Extension  :   5   Ankle Dorsiflexion :   5   Ankle Plantarflexion :   5   STORHOLT, PT, CASSIE W - 05/21/2016 10:03 EDT   Sensation   Left Upper Extremity Sensation   Light Touch :   Intact   STORHOLT, PT, CASSIE W - 05/21/2016 10:03 EDT   Right Upper Extremity Sensation   Light Touch :   Intact   STORHOLT, PT, CASSIE W - 05/21/2016 10:03 EDT   Left Lower Extremity Sensation   Light Touch :   Intact   STORHOLT, PT, CASSIE W - 05/21/2016 10:03 EDT   Right Lower Extremity Sensation   Light Touch :   Intact   STORHOLT, PT, CASSIE W - 05/21/2016 10:03 EDT   Mobility   Mobility Grid   Supine to Sit :   Rehab Complete independence   Transfer Sit to Stand :   Rehab Complete independence   Transfer Bed to and From Chair :   Rehab Complete independence  STORHOLT, PT, CASSIE W - 2016-05-27 10:03 EDT   Ambulation Level :   Supervision or setup   Ambulation Quality :   no LOB, no unsteadiness, pt reports he is at baseline c amb   Ambulation Distance :   500 ft   Device :   None   Stairs :   Supervision or setup   Number of Stairs Performed :   4    Stair Railing Utilized :   Right side up the stairs   STORHOLT, PT, CASSIE W - May 27, 2016 10:03 EDT   Balance   Balance Tests Performed :   Other: good seated & standing balance   STORHOLT, PT, CASSIE W - 27-May-2016 10:03 EDT   Assessment   Discharge Recommendations :   home c family A     PT Treatment Recommendations :   Pt presents to acute PT 2* CVA c good balance during amb 500 ft s AD & stair training. He reports he is at baseline level for functional mobility. No further acute skilled PT needs. Safe to amb c nsg supervision.     STORHOLT, PT, CASSIE W - 05-27-2016 10:03 EDT   Long Term Goals   Bed, Chair, Wheelchair Goal   Bed, Chair, Wheelchair Goal :   Complete independence   (Comment: met in 1 day [STORHOLT, PT, CASSIE W - 05-27-16 10:03 EDT] )   Toilet Transfer Goal :   Modified independence   Ambulation Level Surfaces Goal :   Supervision or setup   (Comment: 500 ft s AD [STORHOLT, PT,  CASSIE W - 05/27/2016 10:03 EDT] )   Stairs Ambulation :   Supervision or setup   (Comment: 4 c handrail [STORHOLT, PT, CASSIE W - May 27, 2016 10:03 EDT] )   BARUCH, PT, CASSIE W - 05-27-16 10:03 EDT   PT LT Goals Reviewed :   Yes   STORHOLT, PT, CASSIE W - May 27, 2016 10:03 EDT   Short Term Goals   PT ST Goals Reviewed :   Yes   STORHOLT, PT, CASSIE W - 05-27-16 10:03 EDT   Plan   PT Frequency Acute :   Once   Treatment Plan/Goals Established With Patient/Caregiver :   Yes   Evaluation Complete :   Yes   STORHOLT, PT, CASSIE W - May 27, 2016 10:03 EDT   Time Spent With Patient   PT Evaluation Units, Low Complexity :   1 Unit   PT Individual Eval Time, Low Complexity :   15 minutes   PT Therapeutic Activity Units :   1 units   PT Therapeutic Activity Time :   10 minutes   PT Total Individual Min :   15    PT Treatment Time Comment :   PT eval, sup to sit to stand, amb 500 ft s AD, stair training, pt left seated in bedside chair c needs in reach, nsg updated   PT Total Timed Code Treatment Units :   1 units   PT Total Timed Code Min :   10    PT Total Untimed Min Ac/Outp :   15    PT Total Treatment Time Ac/Outp :   25    STORHOLT, PT, CASSIE W - 05-27-2016 10:03 EDT   PT G-Codes and Modifiers   Primary Functional Limitation Current Status (ref) :   Mobility   Mobility Current Status [phone]) :   CI At least 1% but less than 20% impaired   Current Status Selection Method :  Used clinical judgment   Primary Functional Limitation Goal Status (ref) :   Mobility   Mobility Goal Status [phone]) :   CI At least 1% but less than 20% impaired   Goal Status Selection Method :   Used clinical judgment   Primary Functional Limitation Discharge Status (ref) :   Mobility   Mobility Discharge Status [phone]) :   CI At least 1% but less than 20% impaired   Discharge Status Selection Method :   Used clinical judgment   STORHOLT, PT, CASSIE W - 05/21/2016 10:03 EDT

## 2016-05-21 NOTE — Discharge Summary (Signed)
Discharge  Summary                 [facility]  [name], FNP  Admission Date: 05/20/2016  Discharged Date: 05/21/2016    DICTATED BY:  Blanche EastJulie Aadhya Bustamante, FNP    PRINCIPAL DISCHARGE DIAGNOSIS:  Acute cerebrovascular accident.    SECONDARY DISCHARGE DIAGNOSES:  Coronary artery disease, hypertension,  hyperlipidemia, diabetes, supraventricular tachycardia post-ablation.    DIAGNOSTIC STUDIES:      1.  Most recent labs:  WBC 4.2, hemoglobin 11.5, platelets 193, sodium  141, potassium 3.9, chloride 104, CO2 of 24, BUN 24, creatinine 0.9,  total cholesterol 123, HDL 43, triglycerides 130, and LDL 54.      2.  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      3.  CT of head on 05/19/2016.    Impression:  Findings consistent with mild small vessel ischemic change.  No CT evidence of acute process.      4.  CT angio of head and neck on 05/20/2016.    Impression:       A.  No large vessel occlusion or saccular aneurysm.  No hemodynamically significant stenosis within the head or neck.       B.  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  ENT consult is recommended.  No evident pathologic adenopathy.       C.  Borderline prominence of the main pulmonary artery suggestive of early pulmonary artery hypertension.      5.  MRI of brain on 05/20/2016.    Impression:       A.  There 2 punctate foci of acute/subacute infarction right perirolandic without mass effect or hemorrhage.       B.  Small vessel disease and remote lacunar infarctions as above.     6.  Vascular carotid ultrasound 05/20/2016 is still pending. Preliminary report shows no evidence of plaque in the proximal ICA  bilaterally.  Velocities are normal bilaterally.  The vertebral artery is patent with antegrade flow bilaterally.    7.  Preliminary echo report 05/20/2016 shows ejection fraction of greater than 55% with no evidence of systolic or diastolic heart failure.  Complete  report pending.    HOSPITAL COURSE:  Mr. Blake Jones is a  75 year old male with a history  significant for CAD, hypertension, hyperlipidemia, diabetes, SVT  post-ablation about a year and a half ago, GERD, gout, and skin cancer  of the lower lip.  [facility]  [facility] with complaints of left-sided numbness in his arm and  slurred speech.  A head CT was done which did not show hemorrhage or  CT evidence of an acute process.  An MRI was done which did show 2  punctate focal infarcts in the right perirolandic region.  The patient  was admitted to the hospitalist service in stable condition at which time his  presenting symptoms had resolved.  Prior to admit, the patient's blood  pressures had been under control on telmisartan/hydrochlorothiazide.   Lipd panel showed his total cholesterol was 123, HDL was 43, LDL was 54, and   triglycerides were 130 on a home regimen of atorvastatin 20 mg.  His blood sugars were   in control at home on metformin; his A1c on admit was 6.9.  The patient was put on aspirin  325 mg and atorvastatin 40 mg.  There were no major complications  during his hospital stay,  and his neurologic status is back to baseline.   He is alert and oriented x 3.  Mentation is intact.  Speech is regular.    There are no focal deficits.  He will need to follow up with his cardiologist back  at home in [place], [place] for cardiac event monitoring given his history of SVT since   we cannot rule out embolic CVA versus small vessal CVA at this time.  A disc with results  of CT head, CT angio head/neck, and MRI head were sent with the patient.  Also, there was  an incidental finding on the MRI that showed a 2 cm right parotid gland mass; patient was   instructed that he needs to followup with an ENT for further workup/repeat scanning.    PHYSICAL EXAMINATION:  GENERAL:  Alert and oriented in no acute  distress.  HEENT:  Pupils equal, round, and reactive to light.   Extraocular movements intact.  Normal conjunctivae.  Head  is  normocephalic.  Normal hearing.  Moist oral mucosa.  NECK:  Supple,  nontender, no carotid bruits, no JVD, no lymphadenopathy.  LUNGS:   Clear to auscultation bilaterally.  Breathing nonlabored.  HEART:   Normal rate, regular rhythm.  No murmur or gallop.  No edema.   ABDOMEN:  Soft, nontender, nondistended.  Bowel sounds present x 4.   No masses.  No hepatosplenomegaly.  SKIN:  Warm, dry, and pink.  No  rashes or lesions.  NEUROLOGIC:  Awake, alert, and oriented x 3.   Mentation intact.  Cranial nerves II-XII grossly intact.  PSYCHIATRIC:   Pleasant and cooperative, appropriate mood and affect.    DISCHARGE MEDICATIONS:  Allopurinol 100 mg daily, metformin 500  mg daily, omeprazole 20 mg daily, telmisartan/hydrochlorothiazide  80/12.5 daily, aspirin 325 mg daily, atorvastatin 40 mg daily.    Note these changes to the patient's home medications: aspirin has  been increased from 81 mg to 325 mg daily; atorvastatin has been increased  from 20 mg to 40 mg daily.    DISPOSITION:  The patient will be discharged home to [place], [place] (he was  in [place] with his wife visiting his daughter). His wife will be driving him back home this evening.    He is discharged in stable condition.    DIET:  Heart healthy.    ACTIVITY:  Up ad lib.      FOLLOWUP:  The patient is to follow up with his primary care physician  and with his cardiologist within 1 week.  He will also need to followup with ENT in regards  to the incidental finding of a 2 cm right parotid mass for workup/repeat scanning.    Time spent preparing discharge is less than 30 minutes.      Gaylord Shih, FNP  TR: *n DD: 05/21/2016 13:03 TD: 05/21/2016 15:33 Job#: [id number]  \\[id number]\\DOC#: [id number]  \\[id number]\\    Signature Line     Electronically Signed on 05/21/2016 03:59 PM EDT   ________________________________________________   Vida Rigger RENEE               Modified by: Vida Rigger RENEE on 05/21/2016 03:59 PM EDTAddendum by Aldine Contes on May 22, 2016 15:18 EDT               The patient was seen and examined and the discharge plan was discussed in detail with Blanche East, NP    I agree with the physical examination  findings as documented above with the following additions:  Lungs: Clear to auscultation and percussion, normal work of breathing.  Heart: Normal rate, regular rhythm, no murmur, gallop or edema.  Abdomen: Soft, non-tender, non-distended, normal bowel sounds.    I agree with the discharge plan and recommendation is as documented above with the following additions:  Considering patient's history of SVT and the embolic appearance of the strokes he would need outpatient workup for arrhythmia with an implantable event monitor. He will follow-up with his regular cardiologist for this.    Final echocardiogram report shows mild concentric LVH with normal left ventricular systolic function. No valvular abnormalities.  The report will be faxed to the primary care physician and patient's cardiologist.  Signature Line     Electronically Signed on 05/22/2016 03:18 PM EDT   ________________________________________________   Aldine Contes               Modified by: Aldine Contes on 05/22/2016 03:18 PM EDT

## 2016-05-21 NOTE — Nursing Note (Signed)
Nursing Discharge Summary - Text       Nursing Discharge Summary Entered On:  05/21/2016 12:40 EDT    Performed On:  05/21/2016 12:38 EDT by Anice PaganiniKREIDER, RN, KATHERINE A               DC Information   Discharge To, Anticipated :   Home with family support   Mode of Discharge :   Wheelchair   Transportation :   Private vehicle   Accompanied By :   Erma HeritageSpouse   KREIDER, RN, KATHERINE A - 05/21/2016 12:38 EDT   Education   Responsible Learner(s) :   Living Situation: Home independently, Home with family support        Performed by: Raymondo BandSTORHOLT, PT, CASSIE W - 05/21/2016 10:03  Discharge To: Home independently, Home with family support        Performed by: Gregary CromerLee,  Jennifer M - 05/21/2016 11:21  Home Caregiver Name/Relationship: Nolene EbbsLinda Morlock ([phone]        Performed by: Gregary CromerLee,  Jennifer M - 05/21/2016 11:21     Home Caregiver Present for Session :   Yes   Barriers To Learning :   None evident   Teaching Method :   Demonstration, Explanation, Printed materials   VillardKREIDER, RFelix Pacini, KATHERINE A - 05/21/2016 12:38 EDT   Post-Hospital Education Adult Grid   Activity Expectations :   Trenton GammonVerbalizes understanding   Bladder Management :   Verbalizes understanding   Bowel Management :   Verbalizes understanding   Diagnostic Results :   Verbalizes understanding   Importance of Follow-Up Visits :   Verbalizes understanding   Physical Limitations :   Verbalizes understanding   Plan of Care :   Verbalizes understanding   When to Call Health Care Provider :   [facility] CareVerbalizes understanding   Anice PaganiniKREIDER, RFelix Pacini, KATHERINE A - 05/21/2016 12:38 EDT   Health Maintenance Education Adult Grid   Diet/Nutrition :   Verbalizes understanding   Oral Care :   Verbalizes understanding   Anice PaganiniKREIDER, RN, Felix PaciniKATHERINE A - 05/21/2016 12:38 EDT   Medication Education Adult Grid   Drug to Drug Interactions :   IT sales professionalVerbalizes understanding   Safety, Medication :   Verbalizes understanding   Anice PaganiniKREIDER, RN, Felix PaciniKATHERINE A - 05/21/2016 12:38 EDT   Safety Education Adult Grid   Safety, Fall :   Verbalizes  understanding   Anice PaganiniKREIDER, RN, KATHERINE A - 05/21/2016 12:38 EDT   Additional Learner(s) Present :   Spouse   Time Spent Educating Patient :   30 minutes   Anice PaganiniKREIDER, RN, KATHERINE A - 05/21/2016 12:38 EDT

## 2016-05-21 NOTE — Discharge Summary (Signed)
 Inpatient Clinical Summary             [facility]  Post-Acute Care Transfer Instructions  PERSON INFORMATION   Name: Blake Jones, Blake Jones  MRN: [id number]    FIN#: [id number]   PHYSICIANS  Admitting Physician: OCTAVIO VERNELL FOLKS  Attending Physician: OCTAVIO VERNELL FOLKS   PCP: Pcp, None  Discharge Diagnosis:  1:Acute cerebrovascular accident (CVA); 2:DM II (diabetes mellitus, type II), controlled; 3:Hyperlipidemia; 4:Benign essential HTN  Comment:       PATIENT EDUCATION INFORMATION  Instructions:             CVA, Completed  Medication Leaflets:               Follow-up:                          With: Address: When:   Vinie Maxcy, MD [address], [address]  [place], [place]  [address]  [phone] Within 1 week   Comments:   [facility]   Please call and schedule a 1 WEEK hospital appointment with Dr. Maxcy when you return to [place] .          With: Address: When:   Joen Gentry, MD [address]  [place], [place]  [address]  [phone]    Comments:   Please call and schedule a 1 WEEK hospital follow up appointment with Dr. Gentry when you return to [place] .       With: Address: When:   Please contact Dr. Sherwin nurse, [place], for any questions or concerns that you may have about your recent hospital admission.  Hospitalist Service - Phone: [phone]         With: Address: When:   None Pcp         With: Address: When:   Cardiology Dr. Mae  Within 1 week                           MEDICATION LIST  Medication Reconciliation at Discharge:         New Medications  Other Medications  aspirin (aspirin 325 mg oral delayed release tablet) 1 Tabs Oral (given by mouth) every day.  Last Dose:____________________  Medications That Were Updated - Follow Current Instructions  Printed Prescriptions  Current atorvastatin (Lipitor 40 mg oral tablet) 1 Tabs Oral (given by mouth) every day. Refills: 0.  Last Dose:____________________    Medications  that have not changed  Printed Prescriptions  telmisartan-hydrochlorothiazide (Micardis HCT 80 mg-12.5 mg oral tablet) every day.  Last Dose:____________________  Other Medications  allopurinol (allopurinol 100 mg oral tablet) 1 Tabs Oral (given by mouth) every day.  Last Dose:____________________  metFORMIN 500 Milligram Oral (given by mouth) every day.  Last Dose:____________________  omeprazole 20 Milligram Oral (given by mouth) every day.  Last Dose:____________________         Patient's Final Home Medication List Upon Discharge:          allopurinol (allopurinol 100 mg oral tablet) 1 Tabs Oral (given by mouth) every day.  aspirin (aspirin 325 mg oral delayed release tablet) 1 Tabs Oral (given by mouth) every day.  atorvastatin (Lipitor 40 mg oral tablet) 1 Tabs Oral (given by mouth) every day. Refills: 0.  metFORMIN 500 Milligram Oral (given by mouth) every day.  omeprazole 20 Milligram Oral (given by mouth) every day.  telmisartan-hydrochlorothiazide (Micardis HCT 80 mg-12.5 mg  oral tablet) every day.         Comment:       ORDERS         Order Name Order Details   Discharge Patient 05/21/16 12:37:00 EDT, Discharge Home/Self Care

## 2016-05-21 NOTE — Case Communication (Signed)
CM Discharge Planning Assessment - Text       CM Discharge Planning Ongoing Assessment Entered On:  05/21/2016 11:26 EDT    Performed On:  05/21/2016 11:21 EDT by Gregary CromerLee,  Jennifer M               Discharge Needs I   Previously Documented Discharge Needs :   DISCHARGE PLAN/NEEDS:  Discharge To, Anticipated: Home independently, Home with family support - Blake Jones,  Blake - 05/20/16 13:56:00  Needs Assistance with Transportation: No Andria Meuse- Jones,  Blake - 05/20/16 13:56:00  EQUIPMENT/TREATMENT NEEDS:  Needs Assistance at Home Upon Discharge: No Blake Song- Jones,  Blake - 05/20/16 13:56:00  Professional Skilled Services: No Needs - Blake Jones,  Blake - 05/20/16 13:56:00       Previously Documented Benefits Information :   Performed By: Blake Jones,  Blake  - 05/20/16 13:56:00       Anticipated Discharge Date :   05/21/2016 EDT   Anticipated Discharge Time Slot :   1000-1200   Discharge To :   Home independently, Home with family support   Home Caregiver Name/Relationship :   Blake Jones (Spouse)  [phone]   CM Progress Note :   05/20/16: 75 y/o w/admission dx of r/o CVA.  Cm met w/pt who is a/o. Pt lives at home [place]. Pt states that he is followed byPCP, Blake Jones and [facility]. Pt has a glucometer used at home, I of ADL's and is retired. Pt will return home at discharge. Cm will cont. to monitor and assist w/disharge needs. (FWS).  05/21/16 jml: Pt will likely d/c home today. He and his wife will return home today. I presented IM letter and he signed it. He was given a copy. Pt will f/u with his cardiologist at home.     Gregary CromerLee,  Jennifer M - 05/21/2016 11:21 EDT   Discharge Needs II   Home Equipment Rehab :   Dan HumphreysWalker - [employer], Other: Doctor, general practiceGlucometer   Professional Skilled Services :   No Needs   Needs Assistance with Transportation :   No   Needs Assistance at Home Upon Discharge :   No   Discharge Planning Time Spent :   20 minutes   Gregary CromerLee,  Jennifer M - 05/21/2016 11:21  EDT   Benefits   Insurance Information :   Medicare, Other: Tricare for Life   Gregary CromerLee,  Jennifer M - 05/21/2016 11:21 EDT   Discharge Planning   Discharge Arrangements :       Patient Post-Acute Information    Patient Name: Blake Jones, Blake Jones  MRN: [id number]  FIN: [id number]  Gender: Male  DOB: 02-21-2041  Age:  [age] Years        *** No Post-Acute Placement(s) Listed ***          *** No Post-Acute Service(s) Listed ***       Interventions Performed :   All resolved   Discharge Plan Discussion :   Discussed with patient, Patient agrees with plan   Discharge Medicare Message Date/Time :   05/21/2016 11:18 EDT   Barriers to Discharge Identified :   None identified   Gregary CromerLee,  Jennifer M - 05/21/2016 11:21 EDT   Readmission Report   Clinically complex :   Multiple co-morbidities   Gregary CromerLee,  Jennifer M - 05/21/2016 11:27 EDT   Readmission Report :   Low   Is the patient age 76 or older :   Yes  Gregary Cromer - 05/21/2016 11:21 EDT

## 2016-05-23 ENCOUNTER — Telehealth: Payer: Self-pay | Admitting: Internal Medicine

## 2016-05-23 NOTE — Telephone Encounter (Signed)
Received records from [facility] for appointment on 06/06/16 with Dr Debara Pickett.  Records given to [facility] (medical records) for Dr Lysbeth Penner schedule on 06/06/16. lp

## 2016-05-24 DIAGNOSIS — K119 Disease of salivary gland, unspecified: Secondary | ICD-10-CM | POA: Diagnosis not present

## 2016-05-24 DIAGNOSIS — I639 Cerebral infarction, unspecified: Secondary | ICD-10-CM | POA: Diagnosis not present

## 2016-05-24 DIAGNOSIS — I10 Essential (primary) hypertension: Secondary | ICD-10-CM | POA: Diagnosis not present

## 2016-05-24 DIAGNOSIS — E119 Type 2 diabetes mellitus without complications: Secondary | ICD-10-CM | POA: Diagnosis not present

## 2016-06-01 ENCOUNTER — Observation Stay (HOSPITAL_COMMUNITY): Payer: Medicare Other

## 2016-06-01 ENCOUNTER — Observation Stay (HOSPITAL_COMMUNITY)
Admission: EM | Admit: 2016-06-01 | Discharge: 2016-06-05 | Disposition: A | Discharge status: Home / Self Care | Payer: Medicare Other | Attending: Internal Medicine | Admitting: Internal Medicine

## 2016-06-01 ENCOUNTER — Encounter (HOSPITAL_COMMUNITY): Payer: Self-pay

## 2016-06-01 ENCOUNTER — Emergency Department (HOSPITAL_COMMUNITY): Payer: Medicare Other

## 2016-06-01 DIAGNOSIS — Z8673 Personal history of transient ischemic attack (TIA), and cerebral infarction without residual deficits: Secondary | ICD-10-CM | POA: Diagnosis not present

## 2016-06-01 DIAGNOSIS — I639 Cerebral infarction, unspecified: Secondary | ICD-10-CM | POA: Diagnosis not present

## 2016-06-01 DIAGNOSIS — R200 Anesthesia of skin: Secondary | ICD-10-CM | POA: Diagnosis not present

## 2016-06-01 DIAGNOSIS — G459 Transient cerebral ischemic attack, unspecified: Principal | ICD-10-CM | POA: Diagnosis present

## 2016-06-01 DIAGNOSIS — R531 Weakness: Secondary | ICD-10-CM | POA: Diagnosis not present

## 2016-06-01 DIAGNOSIS — M109 Gout, unspecified: Secondary | ICD-10-CM | POA: Diagnosis not present

## 2016-06-01 DIAGNOSIS — E119 Type 2 diabetes mellitus without complications: Secondary | ICD-10-CM | POA: Diagnosis not present

## 2016-06-01 DIAGNOSIS — I10 Essential (primary) hypertension: Secondary | ICD-10-CM | POA: Diagnosis not present

## 2016-06-01 DIAGNOSIS — R202 Paresthesia of skin: Secondary | ICD-10-CM

## 2016-06-01 DIAGNOSIS — E118 Type 2 diabetes mellitus with unspecified complications: Secondary | ICD-10-CM

## 2016-06-01 DIAGNOSIS — Z7982 Long term (current) use of aspirin: Secondary | ICD-10-CM | POA: Diagnosis not present

## 2016-06-01 DIAGNOSIS — K219 Gastro-esophageal reflux disease without esophagitis: Secondary | ICD-10-CM | POA: Diagnosis present

## 2016-06-01 DIAGNOSIS — E785 Hyperlipidemia, unspecified: Secondary | ICD-10-CM | POA: Diagnosis not present

## 2016-06-01 DIAGNOSIS — R29898 Other symptoms and signs involving the musculoskeletal system: Secondary | ICD-10-CM | POA: Diagnosis present

## 2016-06-01 DIAGNOSIS — I471 Supraventricular tachycardia: Secondary | ICD-10-CM | POA: Diagnosis not present

## 2016-06-01 DIAGNOSIS — Z79899 Other long term (current) drug therapy: Secondary | ICD-10-CM | POA: Diagnosis not present

## 2016-06-01 DIAGNOSIS — Z7984 Long term (current) use of oral hypoglycemic drugs: Secondary | ICD-10-CM | POA: Insufficient documentation

## 2016-06-01 DIAGNOSIS — I2510 Atherosclerotic heart disease of native coronary artery without angina pectoris: Secondary | ICD-10-CM | POA: Diagnosis present

## 2016-06-01 DIAGNOSIS — E669 Obesity, unspecified: Secondary | ICD-10-CM | POA: Insufficient documentation

## 2016-06-01 DIAGNOSIS — Z6824 Body mass index (BMI) 24.0-24.9, adult: Secondary | ICD-10-CM | POA: Diagnosis not present

## 2016-06-01 DIAGNOSIS — R209 Unspecified disturbances of skin sensation: Secondary | ICD-10-CM | POA: Diagnosis not present

## 2016-06-01 DIAGNOSIS — M6281 Muscle weakness (generalized): Secondary | ICD-10-CM | POA: Diagnosis not present

## 2016-06-01 DIAGNOSIS — Z85828 Personal history of other malignant neoplasm of skin: Secondary | ICD-10-CM | POA: Diagnosis not present

## 2016-06-01 HISTORY — DX: Anesthesia of skin: R20.0

## 2016-06-01 LAB — DIFFERENTIAL
Basophils Absolute: 0 10*3/uL (ref 0.0–0.1)
Basophils Relative: 0 %
Eosinophils Absolute: 0.2 10*3/uL (ref 0.0–0.7)
Eosinophils Relative: 4 %
Lymphocytes Relative: 29 %
Lymphs Abs: 1.4 10*3/uL (ref 0.7–4.0)
MONO ABS: 0.3 10*3/uL (ref 0.1–1.0)
MONOS PCT: 7 %
NEUTROS PCT: 60 %
Neutro Abs: 2.8 10*3/uL (ref 1.7–7.7)

## 2016-06-01 LAB — I-STAT CHEM 8, ED
BUN: 23 mg/dL — AB (ref 6–20)
CHLORIDE: 102 mmol/L (ref 101–111)
CREATININE: 1.1 mg/dL (ref 0.61–1.24)
Calcium, Ion: 1.15 mmol/L (ref 1.13–1.30)
GLUCOSE: 117 mg/dL — AB (ref 65–99)
HCT: 37 % — ABNORMAL LOW (ref 39.0–52.0)
HEMOGLOBIN: 12.6 g/dL — AB (ref 13.0–17.0)
POTASSIUM: 4.6 mmol/L (ref 3.5–5.1)
Sodium: 142 mmol/L (ref 135–145)
TCO2: 28 mmol/L (ref 0–100)

## 2016-06-01 LAB — COMPREHENSIVE METABOLIC PANEL
ALK PHOS: 61 U/L (ref 38–126)
ALT: 12 U/L — AB (ref 17–63)
AST: 25 U/L (ref 15–41)
Albumin: 3.6 g/dL (ref 3.5–5.0)
Anion gap: 9 (ref 5–15)
BUN: 21 mg/dL — ABNORMAL HIGH (ref 6–20)
CALCIUM: 9.6 mg/dL (ref 8.9–10.3)
CO2: 26 mmol/L (ref 22–32)
CREATININE: 1.16 mg/dL (ref 0.61–1.24)
Chloride: 103 mmol/L (ref 101–111)
GFR calc non Af Amer: 59 mL/min — ABNORMAL LOW (ref >60)
Glucose, Bld: 120 mg/dL — ABNORMAL HIGH (ref 65–99)
Potassium: 4.5 mmol/L (ref 3.5–5.1)
SODIUM: 138 mmol/L (ref 135–145)
Total Bilirubin: 0.4 mg/dL (ref 0.3–1.2)
Total Protein: 8.5 g/dL — ABNORMAL HIGH (ref 6.5–8.1)

## 2016-06-01 LAB — CBC
HCT: 36 % — ABNORMAL LOW (ref 39.0–52.0)
Hemoglobin: 11.6 g/dL — ABNORMAL LOW (ref 13.0–17.0)
MCH: 28.6 pg (ref 26.0–34.0)
MCHC: 32.2 g/dL (ref 30.0–36.0)
MCV: 88.9 fL (ref 78.0–100.0)
Platelets: 247 10*3/uL (ref 150–400)
RBC: 4.05 MIL/uL — ABNORMAL LOW (ref 4.22–5.81)
RDW: 15.6 % — ABNORMAL HIGH (ref 11.5–15.5)
WBC: 4.7 10*3/uL (ref 4.0–10.5)

## 2016-06-01 LAB — I-STAT TROPONIN, ED: Troponin i, poc: 0 ng/mL (ref 0.00–0.08)

## 2016-06-01 LAB — PROTIME-INR
INR: 1.03 (ref 0.00–1.49)
Prothrombin Time: 13.7 seconds (ref 11.6–15.2)

## 2016-06-01 LAB — APTT: aPTT: 31 seconds (ref 24–37)

## 2016-06-01 LAB — CBG MONITORING, ED: Glucose-Capillary: 115 mg/dL — ABNORMAL HIGH (ref 65–99)

## 2016-06-01 MED ORDER — SENNOSIDES-DOCUSATE SODIUM 8.6-50 MG PO TABS: 1.0000 | ORAL_TABLET | Freq: Every evening | ORAL | Status: DC | PRN

## 2016-06-01 MED ORDER — ACETAMINOPHEN 650 MG RE SUPP: 650.0000 mg | RECTAL | Status: DC | PRN

## 2016-06-01 MED ORDER — IRBESARTAN 150 MG PO TABS
150.0000 mg | ORAL_TABLET | Freq: Every day | ORAL | Status: DC
  Administered 2016-06-02 – 2016-06-05 (×4): 150 mg via ORAL
  Filled 2016-06-01 (×4): qty 1

## 2016-06-01 MED ORDER — STROKE: EARLY STAGES OF RECOVERY BOOK
Freq: Once | Status: AC
  Administered 2016-06-01: 1
  Filled 2016-06-01: qty 1

## 2016-06-01 MED ORDER — NITROGLYCERIN 0.4 MG SL SUBL: 0.4000 mg | SUBLINGUAL_TABLET | SUBLINGUAL | Status: DC | PRN

## 2016-06-01 MED ORDER — ALLOPURINOL 100 MG PO TABS
100.0000 mg | ORAL_TABLET | Freq: Every day | ORAL | Status: DC
  Administered 2016-06-02 – 2016-06-05 (×4): 100 mg via ORAL
  Filled 2016-06-01 (×4): qty 1

## 2016-06-01 MED ORDER — ATORVASTATIN CALCIUM 40 MG PO TABS
40.0000 mg | ORAL_TABLET | Freq: Every day | ORAL | Status: DC
  Administered 2016-06-02 – 2016-06-05 (×5): 40 mg via ORAL
  Filled 2016-06-01 (×5): qty 1

## 2016-06-01 MED ORDER — PANTOPRAZOLE SODIUM 40 MG PO TBEC
40.0000 mg | DELAYED_RELEASE_TABLET | Freq: Every day | ORAL | Status: DC
  Administered 2016-06-02 – 2016-06-05 (×4): 40 mg via ORAL
  Filled 2016-06-01 (×4): qty 1

## 2016-06-01 MED ORDER — HYDROCHLOROTHIAZIDE 10 MG/ML ORAL SUSPENSION
6.2500 mg | Freq: Every day | ORAL | Status: DC
  Administered 2016-06-02 – 2016-06-05 (×2): 6.25 mg via ORAL
  Filled 2016-06-01 (×5): qty 1.25

## 2016-06-01 MED ORDER — ASPIRIN EC 325 MG PO TBEC
325.0000 mg | DELAYED_RELEASE_TABLET | Freq: Every day | ORAL | Status: DC
  Administered 2016-06-02 – 2016-06-05 (×4): 325 mg via ORAL
  Filled 2016-06-01 (×4): qty 1

## 2016-06-01 MED ORDER — TELMISARTAN-HCTZ 80-12.5 MG PO TABS: 0.5000 | ORAL_TABLET | Freq: Every day | ORAL | Status: DC

## 2016-06-01 MED ORDER — ACETAMINOPHEN 325 MG PO TABS
650.0000 mg | ORAL_TABLET | ORAL | Status: DC | PRN
  Administered 2016-06-04 (×2): 650 mg via ORAL
  Filled 2016-06-01 (×2): qty 2

## 2016-06-01 NOTE — ED Notes (Signed)
CBG: 115 

## 2016-06-01 NOTE — H&P (Signed)
History and Physical    Justin [name] [id number] DOB: 1941-08-09 DOA: 06/01/2016  PCP: Mayra Neer, MD  Patient coming from: home  Chief Complaint: "I just got numb."  HPI: Justin [name] is a 75 y.o. male with medical history significant for evaluation of right upper extremity right face numbness. Patient states that he had recent admission to outside hospital where he presented with left upper extremity and left face numbness. He was told MRI confirmed that he had a stroke at that time.Patient presented to our emergency room for concern for same. Patient denies any head trauma.  Denies any fevers, nausea,  Vomiting and weakness.  ED Course:   Review of Systems: As per HPI otherwise 10 point review of systems negative.    Past Medical History  Diagnosis Date  . Hypertension   . Diabetes mellitus without complication ([place])   . GERD (gastroesophageal reflux disease)   . Gout   . Paroxysmal SVT (supraventricular tachycardia) (HCC)     a. s/p RFCA on 05/01/15    Past Surgical History  Procedure Laterality Date  . Squamous cell carcinoma excision    . Basal cell carcinoma excision      off of back  . Electrophysiologic study N/A 05/01/2015    Procedure: SVT Ablation;  Surgeon: Evans Lance, MD;  Location: [place] [facility];  Service: Cardiovascular;  Laterality: N/A;     reports that he has never smoked. He does not have any smokeless tobacco history on file. He reports that he does not drink alcohol or use illicit drugs.  No Known Allergies  Family History  Problem Relation Age of Onset  . Stroke Mother   . Hypertension Mother   . Alzheimer's disease Father   . Heart failure Father   . Down syndrome Daughter      Prior to Admission medications   Medication Sig Start Date End Date Taking? Authorizing Provider  allopurinol (ZYLOPRIM) 100 MG tablet Take 100 mg by mouth daily. 03/21/16  Yes Historical Provider, MD  aspirin 325 MG EC tablet Take 325 mg by mouth  daily.   Yes Historical Provider, MD  aspirin EC 81 MG tablet Take 162 mg by mouth once.    Yes Historical Provider, MD  atorvastatin (LIPITOR) 40 MG tablet Take 40 mg by mouth every morning. 05/24/16  Yes Historical Provider, MD  fluorouracil (EFUDEX) 5 % cream Apply 1 application topically daily as needed (for skin cancer prevention).  12/28/14  Yes Historical Provider, MD  ibuprofen (ADVIL,MOTRIN) 200 MG tablet Take 400 mg by mouth daily as needed for moderate pain.    Yes Historical Provider, MD  metFORMIN (GLUCOPHAGE) 500 MG tablet Take 1 tablet (500 mg total) by mouth 2 (two) times daily with a meal. Patient taking differently: Take 500 mg by mouth daily with breakfast.  12/09/14  Yes Charlynne Cousins, MD  MICARDIS HCT 80-12.5 MG tablet TAKE ONE-HALF (1/2) TABLET DAILY (DISCARD REMAINDER AFTER OPENING). Patient taking differently: TAKE ONE TABLET DAILY (DISCARD REMAINDER AFTER OPENING). 02/05/16  Yes Pixie Casino, MD  nitroGLYCERIN (NITROSTAT) 0.4 MG SL tablet Place 1 tablet (0.4 mg total) under the tongue every 5 (five) minutes as needed for chest pain. 01/31/15  Yes Pixie Casino, MD  omeprazole (PRILOSEC) 20 MG capsule Take 20 mg by mouth every morning.    Yes Historical Provider, MD    Physical Exam: Constitutional: NAD, calm, comfortable Filed Vitals:   06/01/16 1745 06/01/16 1829 06/01/16 1913 06/01/16 1915  BP: 133/69  115/73  125/72  Pulse: 62 66  65  Resp: 12 18  18   SpO2: 100% 100% 100% 100%   Eyes: PERRL, lids and conjunctivae normal ENMT: Mucous membranes are moist. Posterior pharynx clear of any exudate or lesions.Normal dentition.  Neck: normal, supple, no masses, no thyromegaly Respiratory: clear to auscultation bilaterally, no wheezing, no crackles. Normal respiratory effort. No accessory muscle use.  Cardiovascular: Regular rate and rhythm, no murmurs / rubs / gallops. No extremity edema. 2+ pedal pulses. No carotid bruits.  Abdomen: no tenderness, no masses  palpated. No hepatosplenomegaly. Bowel sounds positive.  Musculoskeletal: no clubbing / cyanosis. No joint deformity upper and lower extremities. Good ROM, no contractures. Normal muscle tone.  Skin: no rashes, lesions, ulcers. No induration Neurologic: CN 2-12 grossly intact. Sensation intact, DTR normal. Strength 5/5 in all 4.  Psychiatric: Normal judgment and insight. Alert and oriented x 3. Normal mood.    Labs on Admission: I have personally reviewed following labs and imaging studies  CBC:  Recent Labs Lab 06/01/16 1728 06/01/16 1730  WBC 4.7  --   NEUTROABS 2.8  --   HGB 11.6* 12.6*  HCT 36.0* 37.0*  MCV 88.9  --   PLT 247  --    Basic Metabolic Panel:  Recent Labs Lab 06/01/16 1728 06/01/16 1730  NA 138 142  K 4.5 4.6  CL 103 102  CO2 26  --   GLUCOSE 120* 117*  BUN 21* 23*  CREATININE 1.16 1.10  CALCIUM 9.6  --    GFR: CrCl cannot be calculated (Unknown ideal weight.). Liver Function Tests:  Recent Labs Lab 06/01/16 1728  AST 25  ALT 12*  ALKPHOS 61  BILITOT 0.4  PROT 8.5*  ALBUMIN 3.6   No results for input(s): LIPASE, AMYLASE in the last 168 hours. No results for input(s): AMMONIA in the last 168 hours. Coagulation Profile:  Recent Labs Lab 06/01/16 1728  INR 1.03   Cardiac Enzymes: No results for input(s): CKTOTAL, CKMB, CKMBINDEX, TROPONINI in the last 168 hours. BNP (last 3 results) No results for input(s): PROBNP in the last 8760 hours. HbA1C: No results for input(s): HGBA1C in the last 72 hours. CBG:  Recent Labs Lab 06/01/16 1745  GLUCAP 115*   Lipid Profile: No results for input(s): CHOL, HDL, LDLCALC, TRIG, CHOLHDL, LDLDIRECT in the last 72 hours. Thyroid Function Tests: No results for input(s): TSH, T4TOTAL, FREET4, T3FREE, THYROIDAB in the last 72 hours. Anemia Panel: No results for input(s): VITAMINB12, FOLATE, FERRITIN, TIBC, IRON, RETICCTPCT in the last 72 hours. Urine analysis: No results found for: COLORURINE,  APPEARANCEUR, LABSPEC, PHURINE, GLUCOSEU, HGBUR, BILIRUBINUR, KETONESUR, PROTEINUR, UROBILINOGEN, NITRITE, LEUKOCYTESUR Sepsis Labs: !!!!!!!!!!!!!!!!!!!!!!!!!!!!!!!!!!!!!!!!!!!! @LABRCNTIP (procalcitonin:4,lacticidven:4) )No results found for this or any previous visit (from the past 240 hour(s)).   Radiological Exams on Admission: Ct Head Wo Contrast  06/01/2016  CLINICAL DATA:  Facial numbness r arm numbness Hx of stroke x2 wks ago Last normal 16:30 today EXAM: CT HEAD WITHOUT CONTRAST TECHNIQUE: Contiguous axial images were obtained from the base of the skull through the vertex without intravenous contrast. COMPARISON:  None. FINDINGS: Mild atrophy. Mild low attenuation in the deep white matter. No evidence of vascular territory infarct. No hydrocephalus. No hemorrhage or extra-axial fluid. Calvarium intact. IMPRESSION: Mild age-related involutional change with no acute findings. Critical Value/emergent results were called by telephone at the time of interpretation on 06/01/2016 at 5:34 pm to Dr. Leonel Ramsay , who verbally acknowledged these results. Electronically Signed   By: Kyung Rudd  Rubner M.D.   On: 06/01/2016 17:34    EKG: Independently reviewed. NSR  Assessment/Plan Principal Problem:   TIA (transient ischemic attack) Active Problems:   Diabetes mellitus (HCC)   Hyperlipidemia   Hypertension   GERD (gastroesophageal reflux disease)   Gout   CAD (coronary artery disease), native coronary artery   Numbness   Right arm numbness  (please populate well all problems here in Problem List. (For example, if patient is on BP meds at home and you resume or decide to hold them, it is a problem that needs to be her. Same for CAD, COPD, HLD and so on)   TIA Stroke/right arm numbness CT head negative for acute bleed MRI ordered Aspirin full dose given  And daily A1c ordered Lipid panel ordered Admitted to telemetry bed Echo ordered Carotid Dopplers ordered MRA ordered Stroke team to  follow patient  gout continue allopurinol 100 mg daily  hyperlipidemia Continue Lipitor 40 mg by mouth daily   Coronary artery disease continue when necessary nitroglycerin sublingual 0.4 mg  Diabetes Holding metformin 500 mg twice a day ssi  reflux Continue PPI as protonix  Hypertension When necessary hydralazine 10 mg IV as needed for severe blood pressure Take one half tablet of micardis   DVT prophylaxis: scd Code Status: full Family Communication: at bedside Disposition Plan: home Consults called: none Admission status: tele obs   Elwin Mocha MD [facility] Pager [phone]  If 7PM-7AM, please contact night-coverage [email or web address] Password [email or web address]  06/01/2016, 8:07 PM

## 2016-06-01 NOTE — Progress Notes (Signed)
Code stroke called at 1717, patient arrived to [facility] ED via private vehicle at [phone].  LSN 1630, Patient states he had facial numbness and right arm numbness.  Patient states he had a stroke 2 weeks ago. NIHSS 0

## 2016-06-01 NOTE — ED Provider Notes (Signed)
CSN: [id number]     Arrival date & time 06/01/16  1712 History   First MD Initiated Contact with Patient 06/01/16 1721     Chief Complaint  Patient presents with  . Code Stroke   Patient is a 75 y.o. male presenting with Acute Neurological Problem.  Cerebrovascular Accident This is a new problem. The current episode started today. The problem occurs constantly. The problem has been resolved. Associated symptoms include numbness. Pertinent negatives include no abdominal pain, chest pain, chills, coughing, diaphoresis, fever, nausea, neck pain, urinary symptoms, visual change, vomiting or weakness. Nothing aggravates the symptoms. He has tried nothing for the symptoms.    Past Medical History  Diagnosis Date  . Hypertension   . Diabetes mellitus without complication ([place])   . GERD (gastroesophageal reflux disease)   . Gout   . Paroxysmal SVT (supraventricular tachycardia) (HCC)     a. s/p RFCA on 05/01/15   Past Surgical History  Procedure Laterality Date  . Squamous cell carcinoma excision    . Basal cell carcinoma excision      off of back  . Electrophysiologic study N/A 05/01/2015    Procedure: SVT Ablation;  Surgeon: Evans Lance, MD;  Location: [place] [facility];  Service: Cardiovascular;  Laterality: N/A;   Family History  Problem Relation Age of Onset  . Stroke Mother   . Hypertension Mother   . Alzheimer's disease Father   . Heart failure Father   . Down syndrome Daughter    Social History  Substance Use Topics  . Smoking status: Never Smoker   . Smokeless tobacco: None  . Alcohol Use: No    Review of Systems  Constitutional: Negative for fever, chills and diaphoresis.  Respiratory: Negative for cough.   Cardiovascular: Negative for chest pain.  Gastrointestinal: Negative for nausea, vomiting and abdominal pain.  Musculoskeletal: Negative for neck pain.  Neurological: Positive for numbness. Negative for weakness.  All other systems reviewed and are  negative.     Allergies  Review of patient's allergies indicates no known allergies.  Home Medications   Prior to Admission medications   Medication Sig Start Date End Date Taking? Authorizing Provider  aspirin 325 MG EC tablet Take 325 mg by mouth daily.   Yes Historical Provider, MD  aspirin EC 81 MG tablet Take 162 mg by mouth once.    Yes Historical Provider, MD  allopurinol (ZYLOPRIM) 100 MG tablet Take 100 mg by mouth daily. 03/21/16   Historical Provider, MD  atorvastatin (LIPITOR) 40 MG tablet Take 40 mg by mouth every morning. 05/24/16   Historical Provider, MD  fluorouracil (EFUDEX) 5 % cream APPLY 1 APPLICATION TO AFFECTED AREA DAILY x 2 WEEKS, STOP x 2 WEEKS, REPEAT x 2 WEEKS EXTERNALLY 6 WEEKS 12/28/14   Historical Provider, MD  ibuprofen (ADVIL,MOTRIN) 200 MG tablet Take 400 mg by mouth every 6 (six) hours as needed for moderate pain.    Historical Provider, MD  metFORMIN (GLUCOPHAGE) 500 MG tablet Take 1 tablet (500 mg total) by mouth 2 (two) times daily with a meal. 12/09/14   Charlynne Cousins, MD  MICARDIS HCT 80-12.5 MG tablet TAKE ONE-HALF (1/2) TABLET DAILY (DISCARD REMAINDER AFTER OPENING). 02/05/16   Pixie Casino, MD  nitroGLYCERIN (NITROSTAT) 0.4 MG SL tablet Place 1 tablet (0.4 mg total) under the tongue every 5 (five) minutes as needed for chest pain. 01/31/15   Pixie Casino, MD  omeprazole (PRILOSEC) 20 MG capsule Take 20 mg by mouth  2 (two) times daily.     Historical Provider, MD   BP 125/72 mmHg  Pulse 65  Resp 18  SpO2 100% Physical Exam  Constitutional: He is oriented to person, place, and time. He appears well-developed and well-nourished. No distress.  HENT:  Head: Normocephalic and atraumatic.  Eyes: Conjunctivae are normal. Right eye exhibits no discharge. Left eye exhibits no discharge.  Neck: Normal range of motion.  Cardiovascular: Normal rate, regular rhythm and normal heart sounds.   No murmur heard. Pulmonary/Chest: Breath sounds normal. No  respiratory distress. He exhibits no tenderness.  Abdominal: Soft. Bowel sounds are normal. He exhibits no distension.  Musculoskeletal: Normal range of motion. He exhibits no edema.  Neurological: He is alert and oriented to person, place, and time. No cranial nerve deficit.  5 out of 5 strength in all extremities, normal sensation in all extremities  Skin: Skin is warm. He is not diaphoretic.  Nursing note and vitals reviewed.   ED Course  Procedures (including critical care time) Labs Review Labs Reviewed  CBC - Abnormal; Notable for the following:    RBC 4.05 (*)    Hemoglobin 11.6 (*)    HCT 36.0 (*)    RDW 15.6 (*)    All other components within normal limits  COMPREHENSIVE METABOLIC PANEL - Abnormal; Notable for the following:    Glucose, Bld 120 (*)    BUN 21 (*)    Total Protein 8.5 (*)    ALT 12 (*)    GFR calc non Af Amer 59 (*)    All other components within normal limits  CBG MONITORING, ED - Abnormal; Notable for the following:    Glucose-Capillary 115 (*)    All other components within normal limits  I-STAT CHEM 8, ED - Abnormal; Notable for the following:    BUN 23 (*)    Glucose, Bld 117 (*)    Hemoglobin 12.6 (*)    HCT 37.0 (*)    All other components within normal limits  PROTIME-INR  APTT  DIFFERENTIAL  I-STAT TROPOININ, ED    Imaging Review Ct Head Wo Contrast  06/01/2016  CLINICAL DATA:  Facial numbness r arm numbness Hx of stroke x2 wks ago Last normal 16:30 today EXAM: CT HEAD WITHOUT CONTRAST TECHNIQUE: Contiguous axial images were obtained from the base of the skull through the vertex without intravenous contrast. COMPARISON:  None. FINDINGS: Mild atrophy. Mild low attenuation in the deep white matter. No evidence of vascular territory infarct. No hydrocephalus. No hemorrhage or extra-axial fluid. Calvarium intact. IMPRESSION: Mild age-related involutional change with no acute findings. Critical Value/emergent results were called by telephone at  the time of interpretation on 06/01/2016 at 5:34 pm to Dr. Leonel Ramsay , who verbally acknowledged these results. Electronically Signed   By: Skipper Cliche M.D.   On: 06/01/2016 17:34   I have personally reviewed and evaluated these images and lab results as part of my medical decision-making.   EKG Interpretation   Date/Time:  Saturday June 01 2016 17:38:24 EDT Ventricular Rate:  67 PR Interval:  172 QRS Duration: 90 QT Interval:  398 QTC Calculation: 420 R Axis:   38 Text Interpretation:  Sinus rhythm No significant change since last  tracing Confirmed by [facility] MD, [place] ([id number]) on 06/01/2016 5:46:30 PM      MDM   Final diagnoses:  Right arm weakness    Patient arrives as a code stroke secondary to new numbness to right upper extremity that lasted approximately 45  minutes and is now resolved area patient also endorses bilateral facial numbness. Recent stroke 2 weeks ago but numbness at that time was on the left upper extremity. He was admitted at outside hospital where he had an MRI which revealed a stroke but he does not bring the subsequent inpatient workup with him. Neurology initially assessed patient is a code stroke. MRI without acute findings for ischemia or hemorrhage. No neurological deficits on my exam. Patient will be admitted for further evaluation.     Karma Greaser, MD 06/02/16 [id number]  Gareth Morgan, MD 06/02/16 1320

## 2016-06-01 NOTE — ED Notes (Signed)
[facility] contacted to call Code Stroke @ [phone]

## 2016-06-01 NOTE — Consult Note (Signed)
Neurology Consultation Reason for Consult: Right-sided numbness Referring Physician: Arlyn Leak  CC: Right-sided numbness  History is obtained from: Patient  HPI: Justin [name] is a 75 y.o. male with a history of SVT who presented to Hospital in [place] 2 weeks ago with left arm and facial numbness. MRI revealed to tiny areas of infarct in the same distribution. I'm not sure what workup was done at that time, but he did have a CTA head and neck as well as MRI. Today, he had sudden onset right arm and face numbness. It resolved over the course of approximately 30 minutes. He states that this was similar to the episode that he had prior, except that is on the opposite side. It is completely resolved at this point. He denies any difficulty with speech, or weakness.   LKW: 1630 tpa given?: no, resolved symptoms    ROS: A 14 point ROS was performed and is negative except as noted in the HPI.   Past Medical History  Diagnosis Date  . Hypertension   . Diabetes mellitus without complication ([place])   . GERD (gastroesophageal reflux disease)   . Gout   . Paroxysmal SVT (supraventricular tachycardia) (HCC)     a. s/p RFCA on 05/01/15     Family History  Problem Relation Age of Onset  . Stroke Mother   . Hypertension Mother   . Alzheimer's disease Father   . Heart failure Father   . Down syndrome Daughter      Social History:  reports that he has never smoked. He does not have any smokeless tobacco history on file. He reports that he does not drink alcohol or use illicit drugs.   Exam: Current vital signs: BP 124/76 mmHg  Pulse 65  Temp(Src) 97.5 F (36.4 C)  Resp 20  SpO2 100% Vital signs in last 24 hours: Temp:  [97.5 F (36.4 C)] 97.5 F (36.4 C) (06/10 2012) Pulse Rate:  [62-66] 65 (06/10 1945) Resp:  [12-20] 20 (06/10 1945) BP: (115-133)/(69-76) 124/76 mmHg (06/10 1945) SpO2:  [100 %] 100 % (06/10 1945)   Physical Exam  Constitutional: Appears  well-developed and well-nourished.  Psych: Affect appropriate to situation Eyes: No scleral injection HENT: No OP obstrucion Head: Normocephalic.  Cardiovascular: Normal rate and regular rhythm.  Respiratory: Effort normal and breath sounds normal to anterior ascultation GI: Soft.  No distension. There is no tenderness.  Skin: WDI  Neuro: Mental Status: Patient is awake, alert, oriented to person, place, month, year, and situation. Patient is able to give a clear and coherent history. No signs of aphasia or neglect Cranial Nerves: II: Visual Fields are full. Pupils are equal, round, and reactive to light.   III,IV, VI: EOMI without ptosis or diploplia.  V: Facial sensation is decreased around the left corner of his mouth (old finding) VII: Facial movement is symmetric.  VIII: hearing is intact to voice X: Uvula elevates symmetrically XI: Shoulder shrug is symmetric. XII: tongue is midline without atrophy or fasciculations.  Motor: Tone is normal. Bulk is normal. 5/5 strength was present in all four extremities.  Sensory: Sensation is symmetric to light touch and temperature in the arms and legs. Deep Tendon Reflexes: 2+ and symmetric in the biceps and patellae.  Plantars: Toes are downgoing bilaterally.  Cerebellar: FNF and HKS are intact bilaterally   I have reviewed labs in epic and the results pertinent to this consultation are: CMP-unremarkable  I have reviewed the images obtained: CT head-no acute findings  Impression: 75 year old male with hypertension and diabetes who presents with episode very concerning for TIA following a separate stroke 2 weeks ago. The fact that it is contralateral to his previous symptoms is quite concerning for possible cardioembolic source. I'm not certain of the workup that he had prior, but I think at this point he will need to be admitted at the least to repeat echocardiogram and arrange for longer cardiac  monitoring.  Recommendations: 1) obtain outside records, no need to repeat LDL her A1c if they were done 2 weeks ago 2) echocardiogram 3) cardiac monitoring, will likely need longer term 4) continue ASA for now 5) MRI brain 6) stroke team to follow  Roland Rack, MD [facility] [phone]  If 7pm- 7am, please page neurology on call as listed in [place].

## 2016-06-01 NOTE — ED Notes (Signed)
Patient transported to MRI 

## 2016-06-01 NOTE — ED Notes (Addendum)
Pt here with full facial numbness that started about 45 minutes ago (1630). He also reports his right arm went numb too but only lasted 30 minutes. Had stroke 2 weeks ago. No slurred speech, no facial droop noted.

## 2016-06-02 ENCOUNTER — Observation Stay (HOSPITAL_BASED_OUTPATIENT_CLINIC_OR_DEPARTMENT_OTHER): Payer: Medicare Other

## 2016-06-02 ENCOUNTER — Observation Stay (HOSPITAL_COMMUNITY): Payer: Medicare Other

## 2016-06-02 DIAGNOSIS — G459 Transient cerebral ischemic attack, unspecified: Secondary | ICD-10-CM | POA: Diagnosis present

## 2016-06-02 DIAGNOSIS — R208 Other disturbances of skin sensation: Secondary | ICD-10-CM | POA: Diagnosis not present

## 2016-06-02 HISTORY — DX: Transient cerebral ischemic attack, unspecified: G45.9

## 2016-06-02 LAB — ECHOCARDIOGRAM COMPLETE
Ao-asc: 33 cm
CHL CUP MV DEC (S): 275
E decel time: 275 msec
E/e' ratio: 5.33
FS: 33 % (ref 28–44)
IV/PV OW: 0.86
LA vol: 53.6 mL
LADIAMINDEX: 1.69 cm/m2
LASIZE: 33 mm
LAVOLA4C: 50 mL
LAVOLIN: 27.4 mL/m2
LEFT ATRIUM END SYS DIAM: 33 mm
LV TDI E'MEDIAL: 8.38
LVEEAVG: 5.33
LVEEMED: 5.33
LVELAT: 13.2 cm/s
LVOT area: 3.8 cm2
LVOT diameter: 22 mm
Lateral S' vel: 8.05 cm/s
MV pk A vel: 69.8 m/s
MV pk E vel: 70.3 m/s
PV Reg vel dias: 89.6 cm/s
PW: 9.44 mm — AB (ref 0.6–1.1)
TAPSE: 30.8 mm
TDI e' lateral: 13.2

## 2016-06-02 LAB — GLUCOSE, CAPILLARY
GLUCOSE-CAPILLARY: 125 mg/dL — AB (ref 65–99)
GLUCOSE-CAPILLARY: 119 mg/dL — AB (ref 65–99)
Glucose-Capillary: 106 mg/dL — ABNORMAL HIGH (ref 65–99)
Glucose-Capillary: 165 mg/dL — ABNORMAL HIGH (ref 65–99)

## 2016-06-02 LAB — LIPID PANEL
CHOL/HDL RATIO: 3.6 ratio
Cholesterol: 97 mg/dL (ref 0–200)
HDL: 27 mg/dL — AB (ref >40)
LDL CALC: 52 mg/dL (ref 0–99)
TRIGLYCERIDES: 89 mg/dL (ref <150)
VLDL: 18 mg/dL (ref 0–40)

## 2016-06-02 MED ORDER — INSULIN ASPART 100 UNIT/ML ~~LOC~~ SOLN: 0.0000 [IU] | Freq: Three times a day (TID) | SUBCUTANEOUS | Status: DC

## 2016-06-02 NOTE — Progress Notes (Signed)
VASCULAR LAB PRELIMINARY  PRELIMINARY  PRELIMINARY  PRELIMINARY  Carotid duplex completed.    Preliminary report:  1-39% ICA plaquing.  Vertebral artery flow is antegrade.   Shalunda Lindh, RVT 06/02/2016, 5:36 PM

## 2016-06-02 NOTE — Progress Notes (Signed)
STROKE TEAM PROGRESS NOTE   HISTORY OF PRESENT ILLNESS (per record) Justin [name] is a 75 y.o. male with a history of SVT who presented to Hospital in [place] 2 weeks ago with left arm and facial numbness. MRI revealed to tiny areas of infarct in the same distribution. I'm not sure what workup was done at that time, but he did have a CTA head and neck as well as MRI. Today, he had sudden onset right arm and face numbness. It resolved over the course of approximately 30 minutes. He states that this was similar to the episode that he had prior, except that is on the opposite side. It is completely resolved at this point. He denies any difficulty with speech, or weakness.   LKW: 1630 tpa given?: no, resolved symptoms   SUBJECTIVE (INTERVAL HISTORY) His family is not at the bedside.  Overall he feels his condition is completely resolved. 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  Otherwise he was without complaint.  He also shared that he had cardiac ablation back in 01/2015.  We discussed the event from several weeks ago that occurred in [place].  He stated that this event effected the opposite side   OBJECTIVE Temp:  [97.5 F (36.4 C)-98.7 F (37.1 C)] 98.7 F (37.1 C) (06/11 1030) Pulse Rate:  [59-72] 62 (06/11 1030) Cardiac Rhythm:  [-] Normal sinus rhythm (06/11 1030) Resp:  [12-20] 18 (06/11 1030) BP: (104-138)/(61-82) 104/65 mmHg (06/11 1030) SpO2:  [96 %-100 %] 100 % (06/11 1030)  CBC:   Recent Labs Lab 06/01/16 1728 06/01/16 1730  WBC 4.7  --   NEUTROABS 2.8  --   HGB 11.6* 12.6*  HCT 36.0* 37.0*  MCV 88.9  --   PLT 247  --     Basic Metabolic Panel:   Recent Labs Lab 06/01/16 1728 06/01/16 1730  NA 138 142  K 4.5 4.6  CL 103 102  CO2 26  --   GLUCOSE 120* 117*  BUN 21* 23*  CREATININE 1.16 1.10  CALCIUM 9.6  --     Lipid Panel:     Component Value Date/Time   CHOL 97  06/02/2016 0229   TRIG 89 06/02/2016 0229   HDL 27* 06/02/2016 0229   CHOLHDL 3.6 06/02/2016 0229   VLDL 18 06/02/2016 0229   LDLCALC 52 06/02/2016 0229   HgbA1c:  Lab Results  Component Value Date   HGBA1C 7.2* 12/08/2014   Urine Drug Screen: No results found for: LABOPIA, COCAINSCRNUR, LABBENZ, AMPHETMU, THCU, LABBARB    IMAGING  Dg Chest 2 View 06/01/2016   No active cardiopulmonary disease.    Ct Head Wo Contrast 06/01/2016   Mild age-related involutional change with no acute findings.      Mr Justin [name] Head/brain Wo Cm 06/01/2016    MRI HEAD:  No acute intracranial process, specifically no acute ischemia.  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 Patient is aware and has an appointment  MRA HEAD:  No emergent large vessel occlusion or severe stenosis. Mild atherosclerosis.   PHYSICAL EXAM Physical Exam  Constitutional: Appears well-developed and well-nourished.  Psych: Affect appropriate to situation Eyes: No scleral injection HENT: No OP obstrucion Head: Normocephalic.  Cardiovascular: Normal rate and regular rhythm.  Respiratory: Effort normal and  breath sounds normal to anterior ascultation GI: Soft. No distension. There is no tenderness.  Skin: WDI  Neuro: Mental Status: Patient is awake, alert, oriented to person, place, month, year, and situation. Patient is able to give a clear and coherent history. No signs of aphasia or neglect Cranial Nerves: II: Visual Fields are full. Pupils are equal, round, and reactive to light.  III,IV, VI: EOMI without ptosis or diploplia.  V: Facial sensation is decreased around the left corner of his mouth (old finding) VII: Facial movement is symmetric.  VIII: hearing is intact to voice X: Uvula elevates symmetrically XI: Shoulder shrug is  symmetric. XII: tongue is midline without atrophy or fasciculations.  Motor: Tone is normal. Bulk is normal. 5/5 strength was present in all four extremities.  Sensory: Sensation is symmetric to light touch and temperature in the arms and legs. Deep Tendon Reflexes: 2+ and symmetric in the biceps and patellae.  Plantars: Toes are downgoing bilaterally.  Cerebellar: FNF and HKS are intact bilaterally    ASSESSMENT/PLAN [name] is a 75 y.o. male with history of previous strokes, hypertension, diabetes mellitus, gout, and paroxysmal SVT presenting with right arm and facial numbness. He did not receive IV t-PA due to resolution of deficits.  Possible TIA:  Dominant   Resultant resolution of deficits  MRI  No acute intracranial process, Old bilateral basal ganglia lacunar infarcts.   MRA  moderate to severe small vessel disease.  Carotid Doppler - pending  2D Echo - EF 60-65%. No cardiac source of emboli identified.  LDL - 52  HgbA1c pending  VTE prophylaxis - SCDs Diet NPO time specified Except for: Sips with Meds  aspirin 325 mg daily prior to admission, now on aspirin 325 mg daily  Patient counseled to be compliant with his antithrombotic medications  Ongoing aggressive stroke risk factor management  Therapy recommendations: No follow-up occupational or speech therapies recommended. PT eval pending.  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 Patient is aware and has an appointment  Hospital day #   Justin [name] [facility] Pager [phone] 06/02/2016, 3:57 PM  ATTENDING NOTE: Patient was seen and examined by me personally. Documentation reflects findings. The laboratory and radiographic studies reviewed by me. ROS completed by me personally and pertinent positives fully documented  Condition: Stable  Assessment and plan completed by me personally and fully documented above. Plans/Recommendations include:     Between our work-up and the one performed in [place], assessment has been comprehensive  Recommend loop monitor for r/o paroxysmal afib given these 2 vascular events separated by time occurred on contralaterals sides  Antiplatelet therapy for now  SIGNED BY: Dr. Elissa Hefty     To contact Stroke Continuity provider, please refer to [email or web address] After hours, contact General Neurology

## 2016-06-02 NOTE — Evaluation (Addendum)
Occupational Therapy Evaluation and Discharge Patient Details Name: Justin [name] MRN: [id number] DOB: May 22, 1941 Today's Date: 06/02/2016    History of Present Illness Justin [name] is a 75 y.o. male with medical history significant for evaluation of right upper extremity right face numbness. Patient states that he had recent admission to outside hospital where he presented with left upper extremity and left face numbness. He was told MRI confirmed that he had a stroke at that time.Patient presented to our emergency room for concern for same. Patient denies any head trauma. Current MRI negative for acute findings, moderate-severe chronic small vessel disease and old bilateral basal ganglia infarcts.   Clinical Impression   Pt reports he was independent with ADLs and mobility PTA; currently pt is presenting at baseline with functional activities. No changes in sensation, UE weakness, or visual deficits noted. Educated pt on stroke signs and symptoms. Pt planning to d/c home with intermittent supervision from family. No further acute OT needs identified; signing off at this time. Please re-consult if needs change. Thank you for this referral.    Follow Up Recommendations  No OT follow up;Supervision - Intermittent    Equipment Recommendations  None recommended by OT    Recommendations for Other Services       Precautions / Restrictions Restrictions Weight Bearing Restrictions: No      Mobility Bed Mobility Overal bed mobility: Independent             General bed mobility comments: HOB flat without use of bedrail. Neg for dizziness.  Transfers Overall transfer level: Independent Equipment used: None             General transfer comment: No unsteadiness or LOB noted.    Balance Overall balance assessment: No apparent balance deficits (not formally assessed)                                          ADL Overall ADL's : Independent;At baseline                                        General ADL Comments: Pt feels that all his deficits have completely resolved; no sensation changes, UE weakness, or visual deficits noted. Pt able to perform higher level balance activities without unsteadiness or LOB. Educated pt on signs and symptoms of stroke and need to act if experiencing symptoms; pt verbalized understanding.      Vision Vision Assessment?: No apparent visual deficits   Perception     Praxis      Pertinent Vitals/Pain Pain Assessment: No/denies pain     Hand Dominance Right   Extremity/Trunk Assessment Upper Extremity Assessment Upper Extremity Assessment: Overall WFL for tasks assessed (sensation changes have resolved)   Lower Extremity Assessment Lower Extremity Assessment: Defer to PT evaluation   Cervical / Trunk Assessment Cervical / Trunk Assessment: Normal   Communication Communication Communication: No difficulties   Cognition Arousal/Alertness: Awake/alert Behavior During Therapy: WFL for tasks assessed/performed Overall Cognitive Status: Within Functional Limits for tasks assessed                     General Comments       Exercises       Shoulder Instructions      Home Living Family/patient expects to be discharged  to:: Private residence Living Arrangements: Spouse/significant other;Children Available Help at Discharge: Family;Available PRN/intermittently (wife/son work during the day) Type of Home: House       Home Layout: Two level;Bed/bath upstairs     Bathroom Shower/Tub: Tub/shower unit Shower/tub characteristics: Architectural technologist: Standard     Home Equipment: None      Lives With: Spouse    Prior Functioning/Environment Level of Independence: Independent             OT Diagnosis: Generalized weakness   OT Problem List:     OT Treatment/Interventions:      OT Goals(Current goals can be found in the care plan section) Acute Rehab OT  Goals Patient Stated Goal: return home OT Goal Formulation: All assessment and education complete, DC therapy  OT Frequency:     Barriers to D/C:            Co-evaluation              End of Session Nurse Communication: Mobility status  Activity Tolerance: Patient tolerated treatment well Patient left: in chair;with call bell/phone within reach   Time: 1152-1209 OT Time Calculation (min): 17 min Charges:  OT General Charges $OT Visit: 1 Procedure OT Evaluation $OT Eval Low Complexity: 1 Procedure G-Codes: OT G-codes **NOT FOR INPATIENT CLASS** Functional Assessment Tool Used: Clinical judgement Functional Limitation: Self care Self Care Current Status [id number]): 0 percent impaired, limited or restricted Self Care Goal Status [id number]): 0 percent impaired, limited or restricted Self Care Discharge Status [id number]): 0 percent impaired, limited or restricted   Binnie Kand M.S., OTR/L Pager: [phone]  06/02/2016, 12:55 PM

## 2016-06-02 NOTE — Evaluation (Signed)
Speech Language Pathology Evaluation Patient Details Name: Justin [name] MRN: [id number] DOB: 04/21/1941 Today's Date: 06/02/2016 Time: [id number] SLP Time Calculation (min) (ACUTE ONLY): 22 min  Problem List:  Patient Active Problem List   Diagnosis Date Noted  . TIA (transient ischemic attack) 06/02/2016  . Numbness 06/01/2016  . Right arm numbness 06/01/2016  . CAD (coronary artery disease), native coronary artery 03/20/2016  . Hypertension   . GERD (gastroesophageal reflux disease)   . Gout   . S/P RF ablation operation for arrhythmia 12/13/2014  . Hyperlipidemia 12/13/2014  . Diabetes mellitus ([place]) 12/07/2014   Past Medical History:  Past Medical History  Diagnosis Date  . Hypertension   . Diabetes mellitus without complication ([place])   . GERD (gastroesophageal reflux disease)   . Gout   . Paroxysmal SVT (supraventricular tachycardia) ([facility])     a. s/p RFCA on 05/01/15   Past Surgical History:  Past Surgical History  Procedure Laterality Date  . Squamous cell carcinoma excision    . Basal cell carcinoma excision      off of back  . Electrophysiologic study N/A 05/01/2015    Procedure: SVT Ablation;  Surgeon: Evans Lance, MD;  Location: [facility];  Service: Cardiovascular;  Laterality: N/A;   HPI:  Justin [name] is a 75 y.o. male with medical history significant for evaluation of right upper extremity right face numbness. Patient states that he had recent admission to outside hospital where he presented with left upper extremity and left face numbness. He was told MRI confirmed that he had a stroke at that time.Patient presented to our emergency room for concern for same. Patient denies any head trauma.  Current MRI negative for acute findings, moderate-severe chronic small vessel disease and old bilateral basal ganglia infarcts.     Assessment / Plan / Recommendation Clinical Impression  Cognitive/linguistic evaluation was completed using the Mini Mental State  Exam.  The patient achieved a score of 29/30 indicating that cognitive/linguistic skills are functional.  The patient did not demosntrate any motor speech deficits and oral mechanism exam was unremarkable.  He was able to easily converse regarding a variety of topics and easily solved problems and simple math problems.  Acute ST needs are not identified.  Thank you for the consult.      SLP Assessment  Patient does not need any further Speech Lanaguage Pathology Services    Follow Up Recommendations  None          SLP Evaluation Prior Functioning  Cognitive/Linguistic Baseline: Within functional limits Type of Home: House  Lives With: Spouse Available Help at Discharge: Family Vocation: Retired   Associate Professor  Overall Cognitive Status: Within Functional Limits for tasks assessed Arousal/Alertness: Awake/alert Orientation Level: Oriented X4 Attention: Focused Focused Attention: Appears intact Memory: Appears intact Awareness: Appears intact Problem Solving: Appears intact Safety/Judgment: Appears intact    Comprehension  Auditory Comprehension Overall Auditory Comprehension: Appears within functional limits for tasks assessed Yes/No Questions: Within Functional Limits Commands: Within Functional Limits Conversation: Complex Reading Comprehension Reading Status: Within funtional limits    Expression Expression Primary Mode of Expression: Verbal Verbal Expression Overall Verbal Expression: Appears within functional limits for tasks assessed Initiation: No impairment Automatic Speech: Name;Social Response Level of Generative/Spontaneous Verbalization: Conversation Repetition: No impairment Naming: No impairment Pragmatics: No impairment Non-Verbal Means of Communication: Not applicable Written Expression Dominant Hand: Right Written Expression: Within Functional Limits   Oral / Motor  Oral Motor/Sensory Function Overall Oral Motor/Sensory Function: Within functional  limits Motor Speech Overall Motor Speech: Appears within functional limits for tasks assessed Respiration: Within functional limits Phonation: Normal Resonance: Within functional limits Articulation: Within functional limitis Intelligibility: Intelligible Motor Planning: Witnin functional limits Motor Speech Errors: Not applicable   GO          Functional Assessment Tool Used: ASHA NOMS and clinical judgment.   Functional Limitations: Memory Memory Current Status [id number]): 0 percent impaired, limited or restricted Memory Goal Status [id number]): 0 percent impaired, limited or restricted Memory Discharge Status [id number]): 0 percent impaired, limited or restricted         Shelly Flatten, MA, [place] [phone] Lamar Sprinkles 06/02/2016, 10:48 AM

## 2016-06-02 NOTE — Evaluation (Signed)
Physical Therapy Evaluation Patient Details Name: Justin [name] MRN: [id number] DOB: 09/08/1941 Today's Date: 06/02/2016   History of Present Illness  Justin [name] is a 75 y.o. male with medical history significant for evaluation of right upper extremity right face numbness. Patient states that he had recent admission to outside hospital where he presented with left upper extremity and left face numbness. He was told MRI confirmed that he had a stroke at that time.Patient presented to our emergency room for concern for same. Patient denies any head trauma. Current MRI negative for acute findings, moderate-severe chronic small vessel disease and old bilateral basal ganglia infarcts.    Clinical Impression  Pt presents without significant limitations to mobility.  Reports reproduction of UE numbness bilaterally while lying flat for MRI, suggesting a cervical spine contribution.  Educated with handout/demonstration exercise for neck strengthening, activity modification, and recommend OPPT referrral to assess c-spine and investigate l-spine pain complaint as well.  No acute needs.  MD please write Rx for OPPT.    Follow Up Recommendations Outpatient PT (ask for Orthopedic Specialist with spine experience)    Equipment Recommendations  None recommended by PT    Recommendations for Other Services       Precautions / Restrictions Precautions Precautions: None Restrictions Weight Bearing Restrictions: No      Mobility  Bed Mobility Overal bed mobility: Independent             General bed mobility comments: HOB flat without use of bedrail.   Transfers Overall transfer level: Independent Equipment used: None             General transfer comment: No unsteadiness or LOB noted.  Ambulation/Gait Ambulation/Gait assistance: Independent Ambulation Distance (Feet): 25 Feet Assistive device: None Gait Pattern/deviations: Step-through pattern   Gait velocity interpretation: at  or above normal speed for age/gender    Stairs            Wheelchair Mobility    Modified Rankin (Stroke Patients Only) Modified Rankin (Stroke Patients Only) Pre-Morbid Rankin Score: No symptoms Modified Rankin: No significant disability     Balance Overall balance assessment: No apparent balance deficits (not formally assessed)                                           Pertinent Vitals/Pain Pain Assessment: No/denies pain    Home Living Family/patient expects to be discharged to:: Private residence Living Arrangements: Spouse/significant other;Children Available Help at Discharge: Family;Available PRN/intermittently Type of Home: House Home Access: Stairs to enter Entrance Stairs-Rails: None Entrance Stairs-Number of Steps: 3 Home Layout: Two level;Bed/bath upstairs Home Equipment: None      Prior Function Level of Independence: Independent               Hand Dominance   Dominant Hand: Right    Extremity/Trunk Assessment   Upper Extremity Assessment: Defer to OT evaluation (screened c-spine, see general comments below)           Lower Extremity Assessment: Overall WFL for tasks assessed      Cervical / Trunk Assessment: Normal;Other exceptions  Communication   Communication: No difficulties  Cognition Arousal/Alertness: Awake/alert Behavior During Therapy: WFL for tasks assessed/performed Overall Cognitive Status: Within Functional Limits for tasks assessed                      General Comments  Exercises        Assessment/Plan    PT Assessment All further PT needs can be met in the next venue of care  PT Diagnosis  (sensory changes in UEs)   PT Problem List Impaired sensation;Decreased range of motion  PT Treatment Interventions     PT Goals (Current goals can be found in the Care Plan section) Acute Rehab PT Goals Patient Stated Goal: return home PT Goal Formulation: All assessment and  education complete, DC therapy    Frequency     Barriers to discharge        Co-evaluation               End of Session   Activity Tolerance: Patient tolerated treatment well Patient left: in bed;with call bell/phone within reach Nurse Communication: Mobility status    Functional Assessment Tool Used: clinical judgment Functional Limitation: Mobility: Walking and moving around Mobility: Walking and Moving Around Current Status (P9733): At least 1 percent but less than 20 percent impaired, limited or restricted Mobility: Walking and Moving Around Goal Status [phone]): At least 1 percent but less than 20 percent impaired, limited or restricted Mobility: Walking and Moving Around Discharge Status [phone]): At least 1 percent but less than 20 percent impaired, limited or restricted    Time: 1290-4753 PT Time Calculation (min) (ACUTE ONLY): 50 min   Charges:   PT Evaluation $PT Eval Moderate Complexity: 1 Procedure PT Treatments $Therapeutic Exercise: 23-37 mins   PT G Codes:   PT G-Codes **NOT FOR INPATIENT CLASS** Functional Assessment Tool Used: clinical judgment Functional Limitation: Mobility: Walking and moving around Mobility: Walking and Moving Around Current Status (D9179): At least 1 percent but less than 20 percent impaired, limited or restricted Mobility: Walking and Moving Around Goal Status [phone]): At least 1 percent but less than 20 percent impaired, limited or restricted Mobility: Walking and Moving Around Discharge Status [phone]): At least 1 percent but less than 20 percent impaired, limited or restricted    Herbie Drape 06/02/2016, 1:52 PM

## 2016-06-02 NOTE — Progress Notes (Signed)
PROGRESS NOTE                                                                                                                                                                                                             Patient Demographics:    Justin [name], is a 75 y.o. male, DOB - 09-Jul-1941, [id number]  Admit date - 06/01/2016   Admitting Physician Elwin Mocha, MD  Outpatient Primary MD for the patient is Mayra Neer, MD  LOS -   Chief Complaint  Patient presents with  . Code Stroke       Brief Narrative   75 year old male with history of stroke diagnosed recently while visiting [place], who presents with an episode of right upper extremity/face tingling and numbness, admitted for CVA workup.   Subjective:    Justin [name] today has, No headache, No chest pain, No abdominal pain - No Nausea, No new weakness tingling or numbness, No Cough - SOB.    Assessment  & Plan :    Principal Problem:   TIA (transient ischemic attack) Active Problems:   Diabetes mellitus ([place])   Hyperlipidemia   Hypertension   GERD (gastroesophageal reflux disease)   Gout   CAD (coronary artery disease), native coronary artery   Numbness   Right arm numbness   TIA - MRI brain with no evidence of acute infarct, recent hospitalization at [facility] with acute CVA - No need to repeat LDL and A1c given that was done 2 weeks ago - 2-D echo with no evidence of embolic source - Continue with aspirin - High suspicion for embolic stroke, will need loop recorder, scars with cardiology Dr. Domenic Polite, will consult EP in a.m..  gout - continue allopurinol 100 mg daily  hyperlipidemia - Continue Lipitor 40 mg by mouth daily   Coronary artery disease - continue when necessary nitroglycerin sublingual 0.4 mg  Diabetes - Holding metformin 500 mg twice a day - ssi  reflux - Continue PPI as protonix  Hypertension - When  necessary hydralazine 10 mg IV as needed for severe blood pressure - Take one half tablet of micardis   Code Status : Full  Family Communication  : none at bedside  Disposition Plan  : home  Consults  :  neurology  Procedures  : none  DVT Prophylaxis  :  SCD  Lab Results  Component Value Date   PLT 247 06/01/2016    Antibiotics  :    Anti-infectives    None        Objective:   Filed Vitals:   06/02/16 0630 06/02/16 0730 06/02/16 0830 06/02/16 1030  BP: 109/76 119/78 111/76 104/65  Pulse: 59 61 64 62  Temp: 98.7 F (37.1 C) 98.1 F (36.7 C) 98.7 F (37.1 C) 98.7 F (37.1 C)  TempSrc: Oral Oral Oral Oral  Resp: 18 17 18 18   SpO2: 99% 100% 100% 100%    Wt Readings from Last 3 Encounters:  03/20/16 78.472 kg (173 lb)  08/30/15 81.647 kg (180 lb)  05/23/15 83.28 kg (183 lb 9.6 oz)    No intake or output data in the 24 hours ending 06/02/16 1533   Physical Exam  Awake Alert, Oriented X 3, No new F.N deficits, Normal affect [place].AT,PERRAL Supple Neck,No JVD, No cervical lymphadenopathy appriciated.  Symmetrical Chest wall movement, Good air movement bilaterally, CTAB RRR,No Gallops,Rubs or new Murmurs, No Parasternal Heave +ve B.Sounds, Abd Soft, No tenderness, No organomegaly appriciated, No rebound - guarding or rigidity. No Cyanosis, Clubbing or edema, No new Rash or bruise      Data Review:    CBC  Recent Labs Lab 06/01/16 1728 06/01/16 1730  WBC 4.7  --   HGB 11.6* 12.6*  HCT 36.0* 37.0*  PLT 247  --   MCV 88.9  --   MCH 28.6  --   MCHC 32.2  --   RDW 15.6*  --   LYMPHSABS 1.4  --   MONOABS 0.3  --   EOSABS 0.2  --   BASOSABS 0.0  --     Chemistries   Recent Labs Lab 06/01/16 1728 06/01/16 1730  NA 138 142  K 4.5 4.6  CL 103 102  CO2 26  --   GLUCOSE 120* 117*  BUN 21* 23*  CREATININE 1.16 1.10  CALCIUM 9.6  --   AST 25  --   ALT 12*  --   ALKPHOS 61  --   BILITOT 0.4  --     ------------------------------------------------------------------------------------------------------------------  Recent Labs  06/02/16 0229  CHOL 97  HDL 27*  LDLCALC 52  TRIG 89  CHOLHDL 3.6    Lab Results  Component Value Date   HGBA1C 7.2* 12/08/2014   ------------------------------------------------------------------------------------------------------------------ No results for input(s): TSH, T4TOTAL, T3FREE, THYROIDAB in the last 72 hours.  Invalid input(s): FREET3 ------------------------------------------------------------------------------------------------------------------ No results for input(s): VITAMINB12, FOLATE, FERRITIN, TIBC, IRON, RETICCTPCT in the last 72 hours.  Coagulation profile  Recent Labs Lab 06/01/16 1728  INR 1.03    No results for input(s): DDIMER in the last 72 hours.  Cardiac Enzymes No results for input(s): CKMB, TROPONINI, MYOGLOBIN in the last 168 hours.  Invalid input(s): CK ------------------------------------------------------------------------------------------------------------------ No results found for: BNP  Inpatient Medications  Scheduled Meds: . allopurinol  100 mg Oral Daily  . aspirin  325 mg Oral Daily  . atorvastatin  40 mg Oral Daily  . irbesartan  150 mg Oral Daily   And  . hydrochlorothiazide  6.25 mg Oral Daily  . insulin aspart  0-15 Units Subcutaneous TID WC  . pantoprazole  40 mg Oral Daily   Continuous Infusions:  PRN Meds:.acetaminophen **OR** acetaminophen, nitroGLYCERIN, senna-docusate  Micro Results No results found for this or any previous visit (from the past 240 hour(s)).  Radiology Reports Dg Chest 2 View  06/01/2016  CLINICAL DATA:  Right arm numbness beginning today. EXAM: CHEST  2 VIEW COMPARISON:  01/30/2015 FINDINGS: Heart size is normal. There is calcification and unfolding of the thoracic aorta. The vascularity is normal. The lungs are clear. No effusions. Chronic degenerative  changes affect the spine. IMPRESSION: No active cardiopulmonary disease. Electronically Signed   By: Nelson Chimes M.D.   On: 06/01/2016 21:58   Ct Head Wo Contrast  06/01/2016  CLINICAL DATA:  Facial numbness r arm numbness Hx of stroke x2 wks ago Last normal 16:30 today EXAM: CT HEAD WITHOUT CONTRAST TECHNIQUE: Contiguous axial images were obtained from the base of the skull through the vertex without intravenous contrast. COMPARISON:  None. FINDINGS: Mild atrophy. Mild low attenuation in the deep white matter. No evidence of vascular territory infarct. No hydrocephalus. No hemorrhage or extra-axial fluid. Calvarium intact. IMPRESSION: Mild age-related involutional change with no acute findings. Critical Value/emergent results were called by telephone at the time of interpretation on 06/01/2016 at 5:34 pm to Dr. Leonel Ramsay , who verbally acknowledged these results. Electronically Signed   By: Skipper Cliche M.D.   On: 06/01/2016 17:34   Mr Brain Wo Contrast  06/01/2016  CLINICAL DATA:  Two week history of LEFT arm and facial numbness, diagnosed with stroke at outside institution. Evaluate arm numbness. History of hypertension and diabetes. EXAM: MRI HEAD WITHOUT CONTRAST MRA HEAD WITHOUT CONTRAST TECHNIQUE: Multiplanar, multiecho pulse sequences of the brain and surrounding structures were obtained without intravenous contrast. Angiographic images of the head were obtained using MRA technique without contrast. COMPARISON:  CT HEAD June 01, 2016 at 1727 hours FINDINGS: MRI HEAD FINDINGS INTRACRANIAL CONTENTS: No reduced diffusion to suggest acute ischemia. No susceptibility artifact to suggest hemorrhage. The ventricles and sulci are normal for patient's age. Patchy to confluent supratentorial and pontine white matter FLAIR T2 hyperintensities. No suspicious parenchymal signal, mass lesions, mass effect. Old bilateral basal ganglia lacunar infarcts. No abnormal extra-axial fluid collections. No extra-axial  masses though, contrast enhanced sequences would be more sensitive. Normal major intracranial vascular flow voids present at skull base. ORBITS: The included ocular globes and orbital contents are non-suspicious. SINUSES: Mild fronto ethmoidal mucosal thickening without air-fluid levels. Trace LEFT mastoid effusion. SKULL/SOFT TISSUES: No abnormal sellar expansion. No suspicious calvarial bone marrow signal. Craniocervical junction maintained. 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 MRA HEAD FINDINGS ANTERIOR CIRCULATION: Normal flow related enhancement of the included cervical, petrous, cavernous and supraclinoid internal carotid arteries. Patent anterior communicating artery. Normal flow related enhancement of the anterior and middle cerebral arteries, including distal segments. No large vessel occlusion, high-grade stenosis, aneurysm. POSTERIOR CIRCULATION: Codominant vertebral artery. Basilar artery is patent, with normal flow related enhancement of the main branch vessels. Fetal origin RIGHT posterior cerebral artery. Normal flow related enhancement of the posterior cerebral arteries. No large vessel occlusion, high-grade stenosis, aneurysm. Very mild luminal irregularity of the mid to distal middle and posterior cerebral arteries most compatible with atherosclerosis. IMPRESSION: MRI HEAD: No acute intracranial process, specifically no acute ischemia. 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 MRA HEAD: No emergent large vessel occlusion or severe stenosis. Mild atherosclerosis. Electronically Signed   By: Elon Alas M.D.   On: 06/01/2016 22:02   [name] Head/brain Wo Cm  06/01/2016  CLINICAL DATA:  Two week history of LEFT arm and facial numbness, diagnosed  with stroke at outside institution. Evaluate arm numbness. History of hypertension and diabetes. EXAM: MRI HEAD WITHOUT CONTRAST MRA HEAD WITHOUT CONTRAST TECHNIQUE: Multiplanar, multiecho pulse sequences of the brain and surrounding structures were obtained without intravenous contrast. Angiographic images of the head were obtained using MRA technique without contrast. COMPARISON:  CT HEAD June 01, 2016 at 1727 hours FINDINGS: MRI HEAD FINDINGS INTRACRANIAL CONTENTS: No reduced diffusion to suggest acute ischemia. No susceptibility artifact to suggest hemorrhage. The ventricles and sulci are normal for patient's age. Patchy to confluent supratentorial and pontine white matter FLAIR T2 hyperintensities. No suspicious parenchymal signal, mass lesions, mass effect. Old bilateral basal ganglia lacunar infarcts. No abnormal extra-axial fluid collections. No extra-axial masses though, contrast enhanced sequences would be more sensitive. Normal major intracranial vascular flow voids present at skull base. ORBITS: The included ocular globes and orbital contents are non-suspicious. SINUSES: Mild fronto ethmoidal mucosal thickening without air-fluid levels. Trace LEFT mastoid effusion. SKULL/SOFT TISSUES: No abnormal sellar expansion. No suspicious calvarial bone marrow signal. Craniocervical junction maintained. 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 MRA HEAD FINDINGS ANTERIOR CIRCULATION: Normal flow related enhancement of the included cervical, petrous, cavernous and supraclinoid internal carotid arteries. Patent anterior communicating artery. Normal flow related enhancement of the anterior and middle cerebral arteries, including distal segments. No large vessel occlusion, high-grade stenosis, aneurysm. POSTERIOR CIRCULATION: Codominant vertebral artery. Basilar artery is patent, with normal flow related enhancement of the main branch vessels.  Fetal origin RIGHT posterior cerebral artery. Normal flow related enhancement of the posterior cerebral arteries. No large vessel occlusion, high-grade stenosis, aneurysm. Very mild luminal irregularity of the mid to distal middle and posterior cerebral arteries most compatible with atherosclerosis. IMPRESSION: MRI HEAD: No acute intracranial process, specifically no acute ischemia. 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 MRA HEAD: No emergent large vessel occlusion or severe stenosis. Mild atherosclerosis. Electronically Signed   By: Elon Alas M.D.   On: 06/01/2016 22:02    Time Spent in minutes  25 minutes   Geisha Abernathy M.D on 06/02/2016 at 3:33 PM  Between 7am to 7pm - Pager - [phone]  After 7pm go to [email or web address] - password [facility]  [facility] -  Office  [phone]

## 2016-06-03 DIAGNOSIS — G459 Transient cerebral ischemic attack, unspecified: Secondary | ICD-10-CM | POA: Diagnosis not present

## 2016-06-03 LAB — GLUCOSE, CAPILLARY
GLUCOSE-CAPILLARY: 156 mg/dL — AB (ref 65–99)
Glucose-Capillary: 127 mg/dL — ABNORMAL HIGH (ref 65–99)
Glucose-Capillary: 236 mg/dL — ABNORMAL HIGH (ref 65–99)
Glucose-Capillary: 151 mg/dL — ABNORMAL HIGH (ref 65–99)
Glucose-Capillary: 159 mg/dL — ABNORMAL HIGH (ref 65–99)

## 2016-06-03 LAB — HEMOGLOBIN A1C
Hgb A1c MFr Bld: 7.5 % — ABNORMAL HIGH (ref 4.8–5.6)
MEAN PLASMA GLUCOSE: 169 mg/dL

## 2016-06-03 NOTE — Progress Notes (Signed)
    [facility] has been requested to perform a transesophageal echocardiogram on 06/03/2016 for stroke.  After careful review of history and examination, the risks and benefits of transesophageal echocardiogram have been explained including risks of esophageal damage, perforation (1:10,000 risk), bleeding, pharyngeal hematoma as well as other potential complications associated with conscious sedation including aspiration, arrhythmia, respiratory failure and death. Alternatives to treatment were discussed, questions were answered. Patient is willing to proceed.   Patient had stroke 2 weeks ago treated at OSH now present with similar symptom to [facility]. MRI is negative for recurrent stroke. Neurology requested TEE. BP borderline. Otherwise, hgb and platelet normal.   Almyra Deforest, PA-C 06/03/2016 9:30 PM

## 2016-06-03 NOTE — Care Management Obs Status (Signed)
[place] NOTIFICATION   Patient Details  Name: Justin [name] MRN: [id number] Date of Birth: September 02, 1941   Medicare Observation Status Notification Given:  Yes (mri negative)    Pollie Friar, RN 06/03/2016, 2:59 PM

## 2016-06-03 NOTE — Progress Notes (Addendum)
PROGRESS NOTE                                                                                                                                                                                                             Patient Demographics:    Justin [name], is a 75 y.o. male, DOB - [birth year], [id number]  Admit date - 06/01/2016   Admitting Physician Elwin Mocha, MD  Outpatient Primary MD for the patient is Mayra Neer, MD  LOS -   Chief Complaint  Patient presents with  . Code Stroke       Brief Narrative   75 year old male with history of stroke diagnosed recently while visiting [place], who presents with an episode of right upper extremity/face tingling and numbness, admitted for CVA workup, MRI brain negative for acute CVA.   Subjective:    [name] today has, No headache, No chest pain, No abdominal pain - No Nausea, No new weakness tingling or numbness, No Cough - SOB.    Assessment  & Plan :    Principal Problem:   TIA (transient ischemic attack) Active Problems:   Diabetes mellitus ([place])   Hyperlipidemia   Hypertension   GERD (gastroesophageal reflux disease)   Gout   CAD (coronary artery disease), native coronary artery   Numbness   Right arm numbness   TIA - MRI brain with no evidence of acute infarct, recent hospitalization at [facility] with acute CVA - No need to repeat LDL and A1c given that was done 2 weeks ago - 2-D echo with no evidence of embolic source - LDL 52, on Lipitor - Hemoglobin A1c is 7.5 - Carotid Doppler 1-39% ICA plaquing. Vertebral artery flow is antegrade - Continue with aspirin325 mg daily - High suspicion for embolic stroke, will need loop recorder, so plan is for TEE in a.m., and if negative will need loop recorder.  gout - continue allopurinol 100 mg daily  hyperlipidemia - Continue Lipitor 40 mg by mouth daily  Coronary artery disease -  continue when necessary nitroglycerin sublingual 0.4 mg  Diabetes - Holding metformin 500 mg twice a day - ssi  reflux - Continue PPI as protonix  Hypertension - When necessary hydralazine 10 mg IV as needed for severe blood pressure - Take one half tablet of micardis   Code Status :  Full  Family Communication  : none at bedside  Disposition Plan  : home  Consults  :  neurology  Procedures  : none  DVT Prophylaxis  :  SCD  Lab Results  Component Value Date   PLT 247 06/01/2016    Antibiotics  :    Anti-infectives    None        Objective:   Filed Vitals:   06/02/16 2120 06/03/16 0204 06/03/16 0616 06/03/16 0919  BP: 104/77 124/65 114/66 114/75  Pulse: 62 70 73 73  Temp: 97.4 F (36.3 C) 98.5 F (36.9 C) 97.4 F (36.3 C) 97.4 F (36.3 C)  TempSrc: Oral Oral Oral   Resp: 18 16 16 16   Height: 5\' 7"  (1.702 m)     Weight: 72.258 kg (159 lb 4.8 oz)     SpO2: 100% 96% 97% 97%    Wt Readings from Last 3 Encounters:  06/02/16 72.258 kg (159 lb 4.8 oz)  03/20/16 78.472 kg (173 lb)  08/30/15 81.647 kg (180 lb)    No intake or output data in the 24 hours ending 06/03/16 1113   Physical Exam  Awake Alert, Oriented X 3, No new F.N deficits, Normal affect Supple Neck,No JVD, No cervical lymphadenopathy appriciated.  Symmetrical Chest wall movement, Good air movement bilaterally, CTAB RRR,No Gallops,Rubs or new Murmurs, No Parasternal Heave +ve B.Sounds, Abd Soft, No tenderness, No rebound - guarding or rigidity. No Cyanosis, Clubbing or edema, No new Rash or bruise      Data Review:    CBC  Recent Labs Lab 06/01/16 1728 06/01/16 1730  WBC 4.7  --   HGB 11.6* 12.6*  HCT 36.0* 37.0*  PLT 247  --   MCV 88.9  --   MCH 28.6  --   MCHC 32.2  --   RDW 15.6*  --   LYMPHSABS 1.4  --   MONOABS 0.3  --   EOSABS 0.2  --   BASOSABS 0.0  --     Chemistries   Recent Labs Lab 06/01/16 1728 06/01/16 1730  NA 138 142  K 4.5 4.6  CL 103 102    CO2 26  --   GLUCOSE 120* 117*  BUN 21* 23*  CREATININE 1.16 1.10  CALCIUM 9.6  --   AST 25  --   ALT 12*  --   ALKPHOS 61  --   BILITOT 0.4  --    ------------------------------------------------------------------------------------------------------------------  Recent Labs  06/02/16 0229  CHOL 97  HDL 27*  LDLCALC 52  TRIG 89  CHOLHDL 3.6    Lab Results  Component Value Date   HGBA1C 7.5* 06/02/2016   ------------------------------------------------------------------------------------------------------------------ No results for input(s): TSH, T4TOTAL, T3FREE, THYROIDAB in the last 72 hours.  Invalid input(s): FREET3 ------------------------------------------------------------------------------------------------------------------ No results for input(s): VITAMINB12, FOLATE, FERRITIN, TIBC, IRON, RETICCTPCT in the last 72 hours.  Coagulation profile  Recent Labs Lab 06/01/16 1728  INR 1.03    No results for input(s): DDIMER in the last 72 hours.  Cardiac Enzymes No results for input(s): CKMB, TROPONINI, MYOGLOBIN in the last 168 hours.  Invalid input(s): CK ------------------------------------------------------------------------------------------------------------------ No results found for: BNP  Inpatient Medications  Scheduled Meds: . allopurinol  100 mg Oral Daily  . aspirin  325 mg Oral Daily  . atorvastatin  40 mg Oral Daily  . irbesartan  150 mg Oral Daily   And  . hydrochlorothiazide  6.25 mg Oral Daily  . insulin aspart  0-15 Units Subcutaneous TID WC  .  pantoprazole  40 mg Oral Daily   Continuous Infusions:  PRN Meds:.acetaminophen **OR** acetaminophen, nitroGLYCERIN, senna-docusate  Micro Results No results found for this or any previous visit (from the past 240 hour(s)).  Radiology Reports Dg Chest 2 View  06/01/2016  CLINICAL DATA:  Right arm numbness beginning today. EXAM: CHEST  2 VIEW COMPARISON:  01/30/2015 FINDINGS: Heart size  is normal. There is calcification and unfolding of the thoracic aorta. The vascularity is normal. The lungs are clear. No effusions. Chronic degenerative changes affect the spine. IMPRESSION: No active cardiopulmonary disease. Electronically Signed   By: Nelson Chimes M.D.   On: 06/01/2016 21:58   Ct Head Wo Contrast  06/01/2016  CLINICAL DATA:  Facial numbness r arm numbness Hx of stroke x2 wks ago Last normal 16:30 today EXAM: CT HEAD WITHOUT CONTRAST TECHNIQUE: Contiguous axial images were obtained from the base of the skull through the vertex without intravenous contrast. COMPARISON:  None. FINDINGS: Mild atrophy. Mild low attenuation in the deep white matter. No evidence of vascular territory infarct. No hydrocephalus. No hemorrhage or extra-axial fluid. Calvarium intact. IMPRESSION: Mild age-related involutional change with no acute findings. Critical Value/emergent results were called by telephone at the time of interpretation on 06/01/2016 at 5:34 pm to Dr. Leonel Ramsay , who verbally acknowledged these results. Electronically Signed   By: Skipper Cliche M.D.   On: 06/01/2016 17:34   Mr Brain Wo Contrast  06/01/2016  CLINICAL DATA:  Two week history of LEFT arm and facial numbness, diagnosed with stroke at outside institution. Evaluate arm numbness. History of hypertension and diabetes. EXAM: MRI HEAD WITHOUT CONTRAST MRA HEAD WITHOUT CONTRAST TECHNIQUE: Multiplanar, multiecho pulse sequences of the brain and surrounding structures were obtained without intravenous contrast. Angiographic images of the head were obtained using MRA technique without contrast. COMPARISON:  CT HEAD June 01, 2016 at 1727 hours FINDINGS: MRI HEAD FINDINGS INTRACRANIAL CONTENTS: No reduced diffusion to suggest acute ischemia. No susceptibility artifact to suggest hemorrhage. The ventricles and sulci are normal for patient's age. Patchy to confluent supratentorial and pontine white matter FLAIR T2 hyperintensities. No suspicious  parenchymal signal, mass lesions, mass effect. Old bilateral basal ganglia lacunar infarcts. No abnormal extra-axial fluid collections. No extra-axial masses though, contrast enhanced sequences would be more sensitive. Normal major intracranial vascular flow voids present at skull base. ORBITS: The included ocular globes and orbital contents are non-suspicious. SINUSES: Mild fronto ethmoidal mucosal thickening without air-fluid levels. Trace LEFT mastoid effusion. SKULL/SOFT TISSUES: No abnormal sellar expansion. No suspicious calvarial bone marrow signal. Craniocervical junction maintained. 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 MRA HEAD FINDINGS ANTERIOR CIRCULATION: Normal flow related enhancement of the included cervical, petrous, cavernous and supraclinoid internal carotid arteries. Patent anterior communicating artery. Normal flow related enhancement of the anterior and middle cerebral arteries, including distal segments. No large vessel occlusion, high-grade stenosis, aneurysm. POSTERIOR CIRCULATION: Codominant vertebral artery. Basilar artery is patent, with normal flow related enhancement of the main branch vessels. Fetal origin RIGHT posterior cerebral artery. Normal flow related enhancement of the posterior cerebral arteries. No large vessel occlusion, high-grade stenosis, aneurysm. Very mild luminal irregularity of the mid to distal middle and posterior cerebral arteries most compatible with atherosclerosis. IMPRESSION: MRI HEAD: No acute intracranial process, specifically no acute ischemia. 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 MRA HEAD: No emergent large vessel occlusion or severe stenosis. Mild atherosclerosis. Electronically Signed   By: Elon Alas M.D.   On: 06/01/2016 22:02   [name] Head/brain Wo Cm  06/01/2016  CLINICAL DATA:  Two week history of LEFT arm and facial numbness, diagnosed with stroke at outside institution. Evaluate arm numbness. History of hypertension and diabetes. EXAM: MRI HEAD WITHOUT CONTRAST MRA HEAD WITHOUT CONTRAST TECHNIQUE: Multiplanar, multiecho pulse sequences of the brain and surrounding structures were obtained without intravenous contrast. Angiographic images of the head were obtained using MRA technique without contrast. COMPARISON:  CT HEAD June 01, 2016 at 1727 hours FINDINGS: MRI HEAD FINDINGS INTRACRANIAL CONTENTS: No reduced diffusion to suggest acute ischemia. No susceptibility artifact to suggest hemorrhage. The ventricles and sulci are normal for patient's age. Patchy to confluent supratentorial and pontine white matter FLAIR T2 hyperintensities. No suspicious parenchymal signal, mass lesions, mass effect. Old bilateral basal ganglia lacunar infarcts. No abnormal extra-axial fluid collections. No extra-axial masses though, contrast enhanced sequences would be more sensitive. Normal major intracranial vascular flow voids present at skull base. ORBITS: The included ocular globes and orbital contents are non-suspicious. SINUSES: Mild fronto ethmoidal mucosal thickening without air-fluid levels. Trace LEFT mastoid effusion. SKULL/SOFT TISSUES: No abnormal sellar expansion. No suspicious calvarial bone marrow signal. Craniocervical junction maintained. 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 MRA HEAD FINDINGS ANTERIOR CIRCULATION: Normal flow related enhancement of the included cervical, petrous, cavernous and supraclinoid internal carotid arteries. Patent anterior communicating artery. Normal flow related enhancement of the anterior and middle cerebral arteries, including distal segments. No large vessel occlusion, high-grade stenosis,  aneurysm. POSTERIOR CIRCULATION: Codominant vertebral artery. Basilar artery is patent, with normal flow related enhancement of the main branch vessels. Fetal origin RIGHT posterior cerebral artery. Normal flow related enhancement of the posterior cerebral arteries. No large vessel occlusion, high-grade stenosis, aneurysm. Very mild luminal irregularity of the mid to distal middle and posterior cerebral arteries most compatible with atherosclerosis. IMPRESSION: MRI HEAD: No acute intracranial process, specifically no acute ischemia. 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 MRA HEAD: No emergent large vessel occlusion or severe stenosis. Mild atherosclerosis. Electronically Signed   By: Elon Alas M.D.   On: 06/01/2016 22:02    Time Spent in minutes  25 minutes   Ornella Coderre M.D on 06/03/2016 at 11:13 AM  Between 7am to 7pm - Pager - [phone]  After 7pm go to [email or web address] - password [facility]  [facility] -  Office  [phone]

## 2016-06-03 NOTE — Progress Notes (Signed)
STROKE TEAM PROGRESS NOTE   HISTORY OF PRESENT ILLNESS (per record) Justin [name] is a 75 y.o. male with a history of SVT who presented to Hospital in [place] 2 weeks ago with left arm and facial numbness. MRI revealed to tiny areas of infarct in the same distribution. I'm not sure what workup was done at that time, but he did have a CTA head and neck as well as MRI. Today, he had sudden onset right arm and face numbness. It resolved over the course of approximately 30 minutes. He states that this was similar to the episode that he had prior, except that is on the opposite side. It is completely resolved at this point. He denies any difficulty with speech, or weakness.   LKW: 1630 tpa given?: no, resolved symptoms   SUBJECTIVE (INTERVAL HISTORY) His family is not at the bedside.  Overall he feels his condition is completely resolved. 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    We discussed the event from several weeks ago that occurred in [place].  He stated that this event effected the opposite side   OBJECTIVE Temp:  [97.4 F (36.3 C)-98.7 F (37.1 C)] 97.4 F (36.3 C) (06/12 0616) Pulse Rate:  [60-73] 73 (06/12 0616) Cardiac Rhythm:  [-] Normal sinus rhythm (06/12 0400) Resp:  [16-18] 16 (06/12 0616) BP: (104-124)/(65-77) 114/66 mmHg (06/12 0616) SpO2:  [96 %-100 %] 97 % (06/12 0616) Weight:  [72.258 kg (159 lb 4.8 oz)] 72.258 kg (159 lb 4.8 oz) (06/11 2120)  CBC:   Recent Labs Lab 06/01/16 1728 06/01/16 1730  WBC 4.7  --   NEUTROABS 2.8  --   HGB 11.6* 12.6*  HCT 36.0* 37.0*  MCV 88.9  --   PLT 247  --     Basic Metabolic Panel:   Recent Labs Lab 06/01/16 1728 06/01/16 1730  NA 138 142  K 4.5 4.6  CL 103 102  CO2 26  --   GLUCOSE 120* 117*  BUN 21* 23*  CREATININE 1.16 1.10  CALCIUM 9.6  --     Lipid Panel:     Component Value Date/Time   CHOL 97 06/02/2016 0229   TRIG 89  06/02/2016 0229   HDL 27* 06/02/2016 0229   CHOLHDL 3.6 06/02/2016 0229   VLDL 18 06/02/2016 0229   LDLCALC 52 06/02/2016 0229   HgbA1c:  Lab Results  Component Value Date   HGBA1C 7.2* 12/08/2014   Urine Drug Screen: No results found for: LABOPIA, COCAINSCRNUR, LABBENZ, AMPHETMU, THCU, LABBARB    IMAGING  Dg Chest 2 View 06/01/2016   No active cardiopulmonary disease.    Ct Head Wo Contrast 06/01/2016   Mild age-related involutional change with no acute findings.      Mr Jodene [name] Head/brain Wo Cm 06/01/2016    MRI HEAD:  No acute intracranial process, specifically no acute ischemia.  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 Patient is aware and has an appointment  MRA HEAD:  No emergent large vessel occlusion or severe stenosis. Mild atherosclerosis.   PHYSICAL EXAM Physical Exam  Constitutional: Appears well-developed and well-nourished.  Psych: Affect appropriate to situation Eyes: No scleral injection HENT: No OP obstrucion Head: Normocephalic.  Cardiovascular: Normal rate and regular rhythm.  Respiratory: Effort normal and  breath sounds normal to anterior ascultation GI: Soft. No distension. There is no tenderness.  Skin: WDI  Neuro: Mental Status: Patient is awake, alert, oriented to person, place, month, year, and situation. Patient is able to give a clear and coherent history. No signs of aphasia or neglect Cranial Nerves: II: Visual Fields are full. Pupils are equal, round, and reactive to light.  III,IV, VI: EOMI without ptosis or diploplia.  V: Facial sensation is decreased around the left corner of his mouth (old finding) VII: Facial movement is symmetric.  VIII: hearing is intact to voice X: Uvula elevates symmetrically XI: Shoulder shrug is symmetric. XII: tongue is  midline without atrophy or fasciculations.  Motor: Tone is normal. Bulk is normal. 5/5 strength was present in all four extremities.  Sensory: Sensation is symmetric to light touch and temperature in the arms and legs. Deep Tendon Reflexes: 2+ and symmetric in the biceps and patellae.  Plantars: Toes are downgoing bilaterally.  Cerebellar: FNF and HKS are intact bilaterally    ASSESSMENT/PLAN Justin [name] is a 76 y.o. male with history of previous strokes, hypertension, diabetes mellitus, gout, and paroxysmal SVT presenting with right arm and facial numbness. He did not receive IV t-PA due to resolution of deficits.  Possible TIA:  Dominant   Resultant resolution of deficits  MRI  No acute intracranial process, Old bilateral basal ganglia lacunar infarcts.   MRA  moderate to severe small vessel disease.  Carotid Doppler - pending  2D Echo - EF 60-65%. No cardiac source of emboli identified.  LDL - 52  HgbA1c pending  VTE prophylaxis - SCDs Diet NPO time specified Except for: Sips with Meds  aspirin 325 mg daily prior to admission, now on aspirin 325 mg daily  Patient counseled to be compliant with his antithrombotic medications  Ongoing aggressive stroke risk factor management  Therapy recommendations: No follow-up occupational or speech therapies recommended. PT eval pending.  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 Patient is aware and has an appointment  Hospital day #   I have personally examined this patient, reviewed notes, independently viewed imaging studies, participated in medical decision making and plan of care. I have made any additions or clarifications directly to the above note. Agree with note above. I have opted an extensive history from the patient and reviewed his discharge summary from hospital in [place] regarding his recent stroke. He had a small frontal insular cortex infarct which was likely embolic and felt to be cryptogenic with negative source found. Plans were to do outpatient cardiac monitoring. The episode regarding the current presentation of isolated right hand numbness is of less clear etiology but agree patient will benefit from TEE and loop recorder insertion. Discussed with patient, Dr. Clarice Pole and answered questions. Greater than 50% time during this 35 minute visit was spent on counseling and coordination of care about his stroke workup  Antony Contras,  MD Medical Director [facility] Pager: [phone] 06/03/2016 5:43 PM      To contact Stroke Continuity provider, please refer to [email or web address] After hours, contact General Neurology

## 2016-06-04 ENCOUNTER — Encounter (HOSPITAL_COMMUNITY)
Admission: EM | Disposition: A | Discharge status: Home / Self Care | Payer: Self-pay | Source: Home / Self Care | Attending: Emergency Medicine

## 2016-06-04 ENCOUNTER — Observation Stay (HOSPITAL_BASED_OUTPATIENT_CLINIC_OR_DEPARTMENT_OTHER)
Admit: 2016-06-04 | Discharge: 2016-06-04 | Disposition: A | Discharge status: Home / Self Care | Payer: Medicare Other | Attending: Internal Medicine | Admitting: Internal Medicine

## 2016-06-04 ENCOUNTER — Observation Stay (HOSPITAL_BASED_OUTPATIENT_CLINIC_OR_DEPARTMENT_OTHER): Payer: Medicare Other

## 2016-06-04 DIAGNOSIS — I639 Cerebral infarction, unspecified: Secondary | ICD-10-CM

## 2016-06-04 DIAGNOSIS — G459 Transient cerebral ischemic attack, unspecified: Secondary | ICD-10-CM | POA: Diagnosis not present

## 2016-06-04 DIAGNOSIS — I340 Nonrheumatic mitral (valve) insufficiency: Secondary | ICD-10-CM

## 2016-06-04 HISTORY — PX: TEE WITHOUT CARDIOVERSION: SHX5443

## 2016-06-04 HISTORY — PX: EP IMPLANTABLE DEVICE: SHX172B

## 2016-06-04 LAB — GLUCOSE, CAPILLARY
GLUCOSE-CAPILLARY: 142 mg/dL — AB (ref 65–99)
GLUCOSE-CAPILLARY: 109 mg/dL — AB (ref 65–99)
GLUCOSE-CAPILLARY: 88 mg/dL (ref 65–99)
GLUCOSE-CAPILLARY: 216 mg/dL — AB (ref 65–99)
Glucose-Capillary: 63 mg/dL — ABNORMAL LOW (ref 65–99)

## 2016-06-04 SURGERY — ECHOCARDIOGRAM, TRANSESOPHAGEAL
Anesthesia: Moderate Sedation

## 2016-06-04 SURGERY — LOOP RECORDER INSERTION
Anesthesia: LOCAL

## 2016-06-04 MED ORDER — LIDOCAINE-EPINEPHRINE 1 %-1:100000 IJ SOLN
INTRAMUSCULAR | Status: AC
  Filled 2016-06-04: qty 1

## 2016-06-04 MED ORDER — FENTANYL CITRATE (PF) 100 MCG/2ML IJ SOLN
INTRAMUSCULAR | Status: DC | PRN
  Administered 2016-06-04 (×4): 25 ug via INTRAVENOUS

## 2016-06-04 MED ORDER — DIPHENHYDRAMINE HCL 50 MG/ML IJ SOLN
INTRAMUSCULAR | Status: AC
  Filled 2016-06-04: qty 1

## 2016-06-04 MED ORDER — BUTAMBEN-TETRACAINE-BENZOCAINE 2-2-14 % EX AERO
INHALATION_SPRAY | CUTANEOUS | Status: DC | PRN
  Administered 2016-06-04: 2 via TOPICAL

## 2016-06-04 MED ORDER — MIDAZOLAM HCL 5 MG/ML IJ SOLN
INTRAMUSCULAR | Status: AC
  Filled 2016-06-04: qty 2

## 2016-06-04 MED ORDER — LIDOCAINE VISCOUS 2 % MT SOLN
OROMUCOSAL | Status: AC
  Filled 2016-06-04: qty 15

## 2016-06-04 MED ORDER — FENTANYL CITRATE (PF) 100 MCG/2ML IJ SOLN
INTRAMUSCULAR | Status: AC
  Filled 2016-06-04: qty 2

## 2016-06-04 MED ORDER — LIDOCAINE VISCOUS 2 % MT SOLN
OROMUCOSAL | Status: DC | PRN
  Administered 2016-06-04: 10 mL via OROMUCOSAL

## 2016-06-04 MED ORDER — SODIUM CHLORIDE 0.9 % IV SOLN
INTRAVENOUS | Status: DC
  Administered 2016-06-04: 500 mL via INTRAVENOUS

## 2016-06-04 MED ORDER — LIDOCAINE-EPINEPHRINE 1 %-1:100000 IJ SOLN
INTRAMUSCULAR | Status: DC | PRN
  Administered 2016-06-04: 25 mL

## 2016-06-04 MED ORDER — MIDAZOLAM HCL 10 MG/2ML IJ SOLN
INTRAMUSCULAR | Status: DC | PRN
  Administered 2016-06-04: 1 mg via INTRAVENOUS
  Administered 2016-06-04: 2 mg via INTRAVENOUS
  Administered 2016-06-04 (×2): 1 mg via INTRAVENOUS

## 2016-06-04 SURGICAL SUPPLY — 2 items
LOOP REVEAL LINQSYS (Prosthesis & Implant Heart) ×2 IMPLANT
PACK LOOP INSERTION (CUSTOM PROCEDURE TRAY) ×2 IMPLANT

## 2016-06-04 NOTE — Progress Notes (Signed)
   06/04/16 0930  Clinical Encounter Type  Visited With Patient  Visit Type Initial  Referral From Nurse  Spiritual Encounters  Spiritual Needs Other (Comment) (Advanced Directive)  Stress Factors  Patient Stress Factors Not reviewed  Advance Directives (For Healthcare)  Does patient have an advance directive? No  Chaplain responded to request for patient advance directive. Chaplain provided the a copy of the Advanced Directive to the patient. Chaplain explained each section and asked if there were further question.  Patient shared he was due to have surgery at 3 pm.  Chaplain offered to return at 11 am to follow through with completion of paperwork.

## 2016-06-04 NOTE — Progress Notes (Signed)
Patient arrived back to room from TEE at this time. VSS but CBG at 63, pt is alert and oriented with noticeable paleness.15 Gm of carb given. Will recheck sugar. Will call ENDO staff once CBG stable for loop recorder placement.   Ave Filter, RN

## 2016-06-04 NOTE — Progress Notes (Signed)
   06/04/16 1330  Clinical Encounter Type  Visited With Patient  Visit Type Other (Comment) (Advanced Directive)  Stress Factors  Patient Stress Factors Not reviewed  Chaplain visited with patient to complete Advanced Directive.  Notary and witnesses present.  AD completed and signed.  Patient scheduled to have surgery at 3 pm.  Chaplain made available further support if needed.

## 2016-06-04 NOTE — Progress Notes (Signed)
  Echocardiogram Echocardiogram Transesophageal has been performed.  Johny Chess 06/04/2016, 4:08 PM

## 2016-06-04 NOTE — Progress Notes (Signed)
VASCULAR LAB PRELIMINARY  PRELIMINARY  PRELIMINARY  PRELIMINARY  Bilateral lower extremity venous duplex completed.    Preliminary report:  Bilateral:  No evidence of DVT, superficial thrombosis, or Baker's Cyst.   Justin [name], RVS 06/04/2016, 7:14 PM

## 2016-06-04 NOTE — Care Management Note (Signed)
Case Management Note  Patient Details  Name: Justin [name] MRN: [id number] Date of Birth: 10-24-1941  Subjective/Objective:    Pt admitted with TIA. He is from home with his spouse.                 Action/Plan: Rec is for outpatient therapy. CM following for d/c needs.   Expected Discharge Date:                  Expected Discharge Plan:  Home/Self Care  In-House Referral:     Discharge planning Services     Post Acute Care Choice:    Choice offered to:     DME Arranged:    DME Agency:     HH Arranged:    HH Agency:     Status of Service:  In process, will continue to follow  Medicare Important Message Given:    Date Medicare IM Given:    Medicare IM give by:    Date Additional Medicare IM Given:    Additional Medicare Important Message give by:     If discussed at [place] of Stay Meetings, dates discussed:    Additional Comments:  Pollie Friar, RN 06/04/2016, 4:38 PM

## 2016-06-04 NOTE — CV Procedure (Addendum)
TRANSESOPHAGEAL ECHOCARDIOGRAM (TEE) NOTE  INDICATIONS: TIA  PROCEDURE:   Informed consent was obtained prior to the procedure. The risks, benefits and alternatives for the procedure were discussed and the patient comprehended these risks.  Risks include, but are not limited to, cough, sore throat, vomiting, nausea, somnolence, esophageal and stomach trauma or perforation, bleeding, low blood pressure, aspiration, pneumonia, infection, trauma to the teeth and death.    After a procedural time-out, the patient was given 6 mg versed and 50 mcg fentanyl for moderate sedation.  The patient's heart rate, blood pressure, and oxygen saturation are monitored continuously during the procedure.The oropharynx was anesthetized 10 cc of topical 1% viscous lidocaine and 2 cetacaine sprays.  The transesophageal probe was inserted in the esophagus and stomach without difficulty and multiple views were obtained.  The patient was kept under observation until the patient left the procedure room.  The period of conscious sedation is 25 minutes, of which I was present face-to-face 100% of this time. The patient left the procedure room in stable condition.   Agitated microbubble saline contrast was administered.  COMPLICATIONS:    There were no immediate complications.  Findings:  1. LEFT VENTRICLE: The left ventricular wall thickness is mildly increased.  The left ventricular cavity is normal in size. Wall motion is normal.  LVEF is 55-60%.  2. RIGHT VENTRICLE:  The right ventricle is normal in structure and function without any thrombus or masses.    3. LEFT ATRIUM:  The left atrium is dilated in size without any thrombus or masses.  There is not spontaneous echo contrast ("smoke") in the left atrium consistent with a low flow state.  4. LEFT ATRIAL APPENDAGE:  The left atrial appendage is free of any thrombus or masses. The appendage has multiple lobes. Pulse doppler indicates high flow in the  appendage.  5. ATRIAL SEPTUM:  The atrial septum is aneurysmal.  There is evidence for bidirectional shunting by color doppler and saline microbubble, suggestive of a small PFO.  6. RIGHT ATRIUM:  The right atrium is dilated with spontaneous echo contrast, no thrombus or masses are noted  7. MITRAL VALVE:  The mitral valve is normal in structure and function with Mild regurgitation.  There were no vegetations or stenosis.  8. AORTIC VALVE:  The aortic valve is trileaflet with prolapse of the non-coronary cusp and trivial regurgitation.  There were no vegetations or stenosis  9. TRICUSPID VALVE:  The tricuspid valve is normal in structure and function with trivial regurgitation.  There were no vegetations or stenosis  10.  PULMONIC VALVE:  The pulmonic valve is normal in structure and function with no regurgitation.  There were no vegetations or stenosis.   11. AORTIC ARCH, ASCENDING AND DESCENDING AORTA:  There was no Ron Parker et. Al, 1992) atherosclerosis of the ascending aorta, aortic arch, or proximal descending aorta.  12. PULMONARY VEINS: Anomalous pulmonary venous return was not noted.  13. PERICARDIUM: The pericardium appeared normal and non-thickened.  There is no pericardial effusion.  IMPRESSION:   1. Small bidirectional PFO noted. 2. Bilobar left atrial appendage without thrombus. 3. Mild MR. 4. Aortic valve prolapse with trivial AI. 5. LVEF 55-60%  RECOMMENDATIONS:    1.  PFO could be implicated in stroke, however, it is small and a-fib is certainly a possibility given his history of SVT and ablation. Not clear if PFO closure is indicated. Will order bilateral LE venous dopplers. If negative, would consider implantable loop recorder.   Time  Spent Directly with the Patient:  45 minutes   Pixie Casino, MD, [facility] Attending Cardiologist [facility]  06/04/2016, 3:45 PM

## 2016-06-04 NOTE — Progress Notes (Signed)
STROKE TEAM PROGRESS NOTE   HISTORY OF PRESENT ILLNESS (per record) Justin [name] is a 75 y.o. male with a history of SVT who presented to Hospital in [place] 2 weeks ago with left arm and facial numbness. MRI revealed to tiny areas of infarct in the same distribution. I'm not sure what workup was done at that time, but he did have a CTA head and neck as well as MRI. Today, he had sudden onset right arm and face numbness. It resolved over the course of approximately 30 minutes. He states that this was similar to the episode that he had prior, except that is on the opposite side. It is completely resolved at this point. He denies any difficulty with speech, or weakness.   LKW: 1630 tpa given?: no, resolved symptoms   SUBJECTIVE (INTERVAL HISTORY) His family is not at the bedside.  Overall he feels his condition is completely resolved. He has no new complaints today . He has given me the disc however his imaging studies at recent admission for stroke and [facility] in [place] to look at [place]:  [97.5 F (36.4 C)-98.5 F (36.9 C)] 97.5 F (36.4 C) (06/13 1403) Pulse Rate:  [63-80] 74 (06/13 1555) Cardiac Rhythm:  [-] Normal sinus rhythm (06/13 1555) Resp:  [12-22] 12 (06/13 1555) BP: (92-149)/(60-79) 119/73 mmHg (06/13 1555) SpO2:  [94 %-100 %] 94 % (06/13 1555)  CBC:   Recent Labs Lab 06/01/16 1728 06/01/16 1730  WBC 4.7  --   NEUTROABS 2.8  --   HGB 11.6* 12.6*  HCT 36.0* 37.0*  MCV 88.9  --   PLT 247  --     Basic Metabolic Panel:   Recent Labs Lab 06/01/16 1728 06/01/16 1730  NA 138 142  K 4.5 4.6  CL 103 102  CO2 26  --   GLUCOSE 120* 117*  BUN 21* 23*  CREATININE 1.16 1.10  CALCIUM 9.6  --     Lipid Panel:     Component Value Date/Time   CHOL 97 06/02/2016 0229   TRIG 89 06/02/2016 0229   HDL 27* 06/02/2016 0229   CHOLHDL 3.6 06/02/2016 0229   VLDL 18 06/02/2016 0229   LDLCALC 52 06/02/2016 0229   HgbA1c:  Lab  Results  Component Value Date   HGBA1C 7.5* 06/02/2016   Urine Drug Screen: No results found for: LABOPIA, COCAINSCRNUR, LABBENZ, AMPHETMU, THCU, LABBARB    IMAGING  Dg Chest 2 View 06/01/2016   No active cardiopulmonary disease.    Ct Head Wo Contrast 06/01/2016   Mild age-related involutional change with no acute findings.      [name] Head/brain Wo Cm 06/01/2016    MRI HEAD:  No acute intracranial process, specifically no acute ischemia.  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 Patient is aware and has an appointment  MRA HEAD:  No emergent large vessel occlusion or severe stenosis. Mild atherosclerosis.   PHYSICAL EXAM Physical Exam  Constitutional: Appears well-developed and well-nourished.  Psych: Affect appropriate to situation Eyes: No scleral injection HENT: No OP obstrucion Head: Normocephalic.  Cardiovascular: Normal rate and regular rhythm.  Respiratory: Effort normal and breath sounds normal to anterior ascultation GI: Soft. No distension. There is no tenderness.  Skin: WDI  Neuro: Mental Status: Patient is awake, alert, oriented to person, place, month, year,  and situation. Patient is able to give a clear and coherent history. No signs of aphasia or neglect Cranial Nerves: II: Visual Fields are full. Pupils are equal, round, and reactive to light.  III,IV, VI: EOMI without ptosis or diploplia.  V: Facial sensation is decreased around the left corner of his mouth (old finding) VII: Facial movement is symmetric.  VIII: hearing is intact to voice X: Uvula elevates symmetrically XI: Shoulder shrug is symmetric. XII: tongue is midline without atrophy or fasciculations.  Motor: Tone is normal. Bulk is normal. 5/5 strength was present in all four extremities.   Sensory: Sensation is symmetric to light touch and temperature in the arms and legs. Deep Tendon Reflexes: 2+ and symmetric in the biceps and patellae.  Plantars: Toes are downgoing bilaterally.  Cerebellar: FNF and HKS are intact bilaterally    ASSESSMENT/PLAN [name] is a 75 y.o. male with history of previous strokes, hypertension, diabetes mellitus, gout, and paroxysmal SVT presenting with right arm and facial numbness. He did not receive IV t-PA due to resolution of deficits.  Possible TIA:  Dominant   Resultant resolution of deficits  MRI  No acute intracranial process, Old bilateral basal ganglia lacunar infarcts.   MRA  moderate to severe small vessel disease.  Carotid Doppler - pending  2D Echo - EF 60-65%. No cardiac source of emboli identified.  LDL - 52  HgbA1c pending  VTE prophylaxis - SCDs Diet Heart Room service appropriate?: Yes; Fluid consistency:: Thin  aspirin 325 mg daily prior to admission, now on aspirin 325 mg daily  Patient counseled to be compliant with his antithrombotic medications  Ongoing aggressive stroke risk factor management  Therapy recommendations: No follow-up occupational or speech therapies recommended. PT eval pending.  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 Patient is aware and has an appointment  Hospital day #   I have personally  examined this patient, reviewed notes, independently viewed imaging studies, participated in medical decision making and plan of care. I have made any additions or clarifications directly to the above note. Agree with note above. I have  personally reviewed the images of MRI done in [place] which I agree to small embolic right frontal cortical infarcts and agree patient will benefit from TEE and loop recorder insertion 12:00 cardiac source of embolism and paroxysmal atrial fibrillation.. Discussed with patient, Dr. Clarice Pole and answered questions. Greater than 50% time during this 25 minute visit was spent on counseling and coordination of care about his stroke workup  Antony Contras, [place] Pager: [phone] 06/04/2016 5:17 PM      To contact Stroke Continuity provider, please refer to [email or web address] After hours, contact General Neurology

## 2016-06-04 NOTE — Progress Notes (Signed)
PROGRESS NOTE                                                                                                                                                                                                             Patient Demographics:    Justin [name], is a 75 y.o. male, DOB - Apr 12, 1941, [id number]  Admit date - 06/01/2016   Admitting Physician Elwin Mocha, MD  Outpatient Primary MD for the patient is Mayra Neer, MD  LOS -   Chief Complaint  Patient presents with  . Code Stroke       Brief Narrative   75 year old male with history of stroke diagnosed recently while visiting [place], who presents with an episode of right upper extremity/face tingling and numbness, admitted for CVA workup, MRI brain negative for acute CVA, Being worked up for  TIA, TEE significant for PFO, plan for a venous Doppler to rule out DVT, will go for loop recorder today.   Subjective:    [name] today has, No headache, No chest pain, No abdominal pain - No Nausea, No new weakness tingling or numbness, No Cough - SOB.    Assessment  & Plan :    Principal Problem:   TIA (transient ischemic attack) Active Problems:   Diabetes mellitus ([place])   Hyperlipidemia   Hypertension   GERD (gastroesophageal reflux disease)   Gout   CAD (coronary artery disease), native coronary artery   Numbness   Right arm numbness   TIA - MRI brain with no evidence of acute infarct, recent hospitalization at [facility] with acute CVA - 2-D echo with no evidence of embolic source - LDL 52, on Lipitor - Hemoglobin A1c is 7.5 - Carotid Doppler 1-39% ICA plaquing. Vertebral artery flow is antegrade - Continue with aspirin325 mg daily - High suspicion for embolic stroke,TEE significant for PFO , venous Doppler pending, will have loop recorder placed today.  gout - continue allopurinol 100 mg daily  hyperlipidemia - Continue  Lipitor 40 mg by mouth daily  Coronary artery disease - continue when necessary nitroglycerin sublingual 0.4 mg  Diabetes - Holding metformin 500 mg twice a day - ssi  reflux - Continue PPI as protonix  Hypertension - When necessary hydralazine 10 mg IV as needed for severe blood pressure - Take one half tablet of  micardis   Code Status : Full  Family Communication  : none at bedside  Disposition Plan  : home in a.m. after venous Doppler is none  Consults  :  neurology  Procedures  : TEE, loop recorder pending  DVT Prophylaxis  :  SCD  Lab Results  Component Value Date   PLT 247 06/01/2016    Antibiotics  :    Anti-infectives    None        Objective:   Filed Vitals:   06/04/16 1535 06/04/16 1543 06/04/16 1550 06/04/16 1555  BP: 123/69 123/65 130/71 119/73  Pulse: 72 72 73 74  Temp:      TempSrc:      Resp: 13 14 21 12   Height:      Weight:      SpO2: 100% 96% 96% 94%    Wt Readings from Last 3 Encounters:  06/02/16 72.258 kg (159 lb 4.8 oz)  03/20/16 78.472 kg (173 lb)  08/30/15 81.647 kg (180 lb)     Intake/Output Summary (Last 24 hours) at 06/04/16 1657 Last data filed at 06/04/16 1300  Gross per 24 hour  Intake    720 ml  Output      0 ml  Net    720 ml     Physical Exam  Awake Alert, Oriented X 3, No new F.N deficits, Normal affect Supple Neck,No JVD, No cervical lymphadenopathy appriciated.  Symmetrical Chest wall movement, Good air movement bilaterally, CTAB RRR,No Gallops,Rubs or new Murmurs, No Parasternal Heave +ve B.Sounds, Abd Soft, No tenderness, No rebound - guarding or rigidity. No Cyanosis, Clubbing or edema, No new Rash or bruise      Data Review:    CBC  Recent Labs Lab 06/01/16 1728 06/01/16 1730  WBC 4.7  --   HGB 11.6* 12.6*  HCT 36.0* 37.0*  PLT 247  --   MCV 88.9  --   MCH 28.6  --   MCHC 32.2  --   RDW 15.6*  --   LYMPHSABS 1.4  --   MONOABS 0.3  --   EOSABS 0.2  --   BASOSABS 0.0  --      Chemistries   Recent Labs Lab 06/01/16 1728 06/01/16 1730  NA 138 142  K 4.5 4.6  CL 103 102  CO2 26  --   GLUCOSE 120* 117*  BUN 21* 23*  CREATININE 1.16 1.10  CALCIUM 9.6  --   AST 25  --   ALT 12*  --   ALKPHOS 61  --   BILITOT 0.4  --    ------------------------------------------------------------------------------------------------------------------  Recent Labs  06/02/16 0229  CHOL 97  HDL 27*  LDLCALC 52  TRIG 89  CHOLHDL 3.6    Lab Results  Component Value Date   HGBA1C 7.5* 06/02/2016   ------------------------------------------------------------------------------------------------------------------ No results for input(s): TSH, T4TOTAL, T3FREE, THYROIDAB in the last 72 hours.  Invalid input(s): FREET3 ------------------------------------------------------------------------------------------------------------------ No results for input(s): VITAMINB12, FOLATE, FERRITIN, TIBC, IRON, RETICCTPCT in the last 72 hours.  Coagulation profile  Recent Labs Lab 06/01/16 1728  INR 1.03    No results for input(s): DDIMER in the last 72 hours.  Cardiac Enzymes No results for input(s): CKMB, TROPONINI, MYOGLOBIN in the last 168 hours.  Invalid input(s): CK ------------------------------------------------------------------------------------------------------------------ No results found for: BNP  Inpatient Medications  Scheduled Meds: . allopurinol  100 mg Oral Daily  . aspirin  325 mg Oral Daily  . atorvastatin  40 mg Oral Daily  .  irbesartan  150 mg Oral Daily   And  . hydrochlorothiazide  6.25 mg Oral Daily  . insulin aspart  0-15 Units Subcutaneous TID WC  . pantoprazole  40 mg Oral Daily   Continuous Infusions:  PRN Meds:.acetaminophen **OR** acetaminophen, nitroGLYCERIN, senna-docusate  Micro Results No results found for this or any previous visit (from the past 240 hour(s)).  Radiology Reports Dg Chest 2 View  06/01/2016  CLINICAL  DATA:  Right arm numbness beginning today. EXAM: CHEST  2 VIEW COMPARISON:  01/30/2015 FINDINGS: Heart size is normal. There is calcification and unfolding of the thoracic aorta. The vascularity is normal. The lungs are clear. No effusions. Chronic degenerative changes affect the spine. IMPRESSION: No active cardiopulmonary disease. Electronically Signed   By: Nelson Chimes M.D.   On: 06/01/2016 21:58   Ct Head Wo Contrast  06/01/2016  CLINICAL DATA:  Facial numbness r arm numbness Hx of stroke x2 wks ago Last normal 16:30 today EXAM: CT HEAD WITHOUT CONTRAST TECHNIQUE: Contiguous axial images were obtained from the base of the skull through the vertex without intravenous contrast. COMPARISON:  None. FINDINGS: Mild atrophy. Mild low attenuation in the deep white matter. No evidence of vascular territory infarct. No hydrocephalus. No hemorrhage or extra-axial fluid. Calvarium intact. IMPRESSION: Mild age-related involutional change with no acute findings. Critical Value/emergent results were called by telephone at the time of interpretation on 06/01/2016 at 5:34 pm to Dr. Leonel Ramsay , who verbally acknowledged these results. Electronically Signed   By: Skipper Cliche M.D.   On: 06/01/2016 17:34   Mr Brain Wo Contrast  06/01/2016  CLINICAL DATA:  Two week history of LEFT arm and facial numbness, diagnosed with stroke at outside institution. Evaluate arm numbness. History of hypertension and diabetes. EXAM: MRI HEAD WITHOUT CONTRAST MRA HEAD WITHOUT CONTRAST TECHNIQUE: Multiplanar, multiecho pulse sequences of the brain and surrounding structures were obtained without intravenous contrast. Angiographic images of the head were obtained using MRA technique without contrast. COMPARISON:  CT HEAD June 01, 2016 at 1727 hours FINDINGS: MRI HEAD FINDINGS INTRACRANIAL CONTENTS: No reduced diffusion to suggest acute ischemia. No susceptibility artifact to suggest hemorrhage. The ventricles and sulci are normal for  patient's age. Patchy to confluent supratentorial and pontine white matter FLAIR T2 hyperintensities. No suspicious parenchymal signal, mass lesions, mass effect. Old bilateral basal ganglia lacunar infarcts. No abnormal extra-axial fluid collections. No extra-axial masses though, contrast enhanced sequences would be more sensitive. Normal major intracranial vascular flow voids present at skull base. ORBITS: The included ocular globes and orbital contents are non-suspicious. SINUSES: Mild fronto ethmoidal mucosal thickening without air-fluid levels. Trace LEFT mastoid effusion. SKULL/SOFT TISSUES: No abnormal sellar expansion. No suspicious calvarial bone marrow signal. Craniocervical junction maintained. 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 MRA HEAD FINDINGS ANTERIOR CIRCULATION: Normal flow related enhancement of the included cervical, petrous, cavernous and supraclinoid internal carotid arteries. Patent anterior communicating artery. Normal flow related enhancement of the anterior and middle cerebral arteries, including distal segments. No large vessel occlusion, high-grade stenosis, aneurysm. POSTERIOR CIRCULATION: Codominant vertebral artery. Basilar artery is patent, with normal flow related enhancement of the main branch vessels. Fetal origin RIGHT posterior cerebral artery. Normal flow related enhancement of the posterior cerebral arteries. No large vessel occlusion, high-grade stenosis, aneurysm. Very mild luminal irregularity of the mid to distal middle and posterior cerebral arteries most compatible with atherosclerosis. IMPRESSION: MRI HEAD: No acute intracranial  process, specifically no acute ischemia. 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 MRA HEAD: No  emergent large vessel occlusion or severe stenosis. Mild atherosclerosis. Electronically Signed   By: Elon Alas M.D.   On: 06/01/2016 22:02   [name] Head/brain Wo Cm  06/01/2016  CLINICAL DATA:  Two week history of LEFT arm and facial numbness, diagnosed with stroke at outside institution. Evaluate arm numbness. History of hypertension and diabetes. EXAM: MRI HEAD WITHOUT CONTRAST MRA HEAD WITHOUT CONTRAST TECHNIQUE: Multiplanar, multiecho pulse sequences of the brain and surrounding structures were obtained without intravenous contrast. Angiographic images of the head were obtained using MRA technique without contrast. COMPARISON:  CT HEAD June 01, 2016 at 1727 hours FINDINGS: MRI HEAD FINDINGS INTRACRANIAL CONTENTS: No reduced diffusion to suggest acute ischemia. No susceptibility artifact to suggest hemorrhage. The ventricles and sulci are normal for patient's age. Patchy to confluent supratentorial and pontine white matter FLAIR T2 hyperintensities. No suspicious parenchymal signal, mass lesions, mass effect. Old bilateral basal ganglia lacunar infarcts. No abnormal extra-axial fluid collections. No extra-axial masses though, contrast enhanced sequences would be more sensitive. Normal major intracranial vascular flow voids present at skull base. ORBITS: The included ocular globes and orbital contents are non-suspicious. SINUSES: Mild fronto ethmoidal mucosal thickening without air-fluid levels. Trace LEFT mastoid effusion. SKULL/SOFT TISSUES: No abnormal sellar expansion. No suspicious calvarial bone marrow signal. Craniocervical junction maintained. 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 MRA HEAD FINDINGS ANTERIOR CIRCULATION: Normal flow related enhancement of the included cervical, petrous, cavernous and supraclinoid internal carotid arteries. Patent anterior communicating artery. Normal flow related enhancement of the  anterior and middle cerebral arteries, including distal segments. No large vessel occlusion, high-grade stenosis, aneurysm. POSTERIOR CIRCULATION: Codominant vertebral artery. Basilar artery is patent, with normal flow related enhancement of the main branch vessels. Fetal origin RIGHT posterior cerebral artery. Normal flow related enhancement of the posterior cerebral arteries. No large vessel occlusion, high-grade stenosis, aneurysm. Very mild luminal irregularity of the mid to distal middle and posterior cerebral arteries most compatible with atherosclerosis. IMPRESSION: MRI HEAD: No acute intracranial process, specifically no acute ischemia. 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 MRA HEAD: No emergent large vessel occlusion or severe stenosis. Mild atherosclerosis. Electronically Signed   By: Elon Alas M.D.   On: 06/01/2016 22:02    Time Spent in minutes  25 minutes   Prestina Raigoza M.D on 06/04/2016 at 4:57 PM  Between 7am to 7pm - Pager - [phone]  After 7pm go to [email or web address] - password [facility]  [facility] -  Office  [phone]

## 2016-06-04 NOTE — Consult Note (Signed)
ELECTROPHYSIOLOGY CONSULT NOTE  Patient ID: Justin [name] MRN: [id number], DOB/AGE: [birth year]   Admit date: 06/01/2016 Date of Consult: 06/04/2016  Primary Physician: Mayra Neer, MD Primary Cardiologist: Dr. Debara Pickett Reason for Consultation: Recent Cryptogenic stroke, TIA this admission ; recommendations regarding Implantable Loop Recorder Requesting MD, Dr. Leonie Man  History of Present Illness Justin [name] was admitted on 06/01/2016 with RUR/facial numbness.  He has PMHx of an SVT that was ablated in May 01, 2015 (noting no inducible arrhythmias at the time of this procedure), with no other history of or reported or known arrhythmias, HTN, DM, gout and mild nonobstructive CAD by CT angiogram Feb 2016.  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  He comes to [facility] admitted 06/01/16 with right sided symptoms that resolved after 30 minutes.  Imaging demonstrated no acute process this admission with 2 weeks ago diagnosed with cryptogenic stroke in [facility] .  he has undergone workup for stroke including echocardiogram and carotid dopplers.  The patient has been monitored on telemetry which has demonstrated sinus rhythm with no arrhythmias.  Inpatient stroke work-up is to be completed with a TEE this afternoon.   Echocardiogram this admission demonstrated  Study Conclusions - Left ventricle: The cavity size was normal. Systolic function was  normal. The estimated ejection fraction was in the range of 60%  to 65%. Wall motion was normal; there were no regional wall  motion abnormalities. Left ventricular diastolic function  parameters were normal. - Aortic valve: There was trivial regurgitation. - Aorta: Aortic root dimension: 40 mm (ED). - Aortic root: The aortic root was mildly dilated. - Mitral valve: Calcified annulus. - Tricuspid valve: There was trivial regurgitation. - Pulmonic valve: There was trivial  regurgitation.  06/02/16: Prelim Carotid US:  Preliminary report: 1-39% ICA plaquing. Vertebral artery flow is antegrade.   Lab work is reviewed.  Prior to admission, the patient denies chest pain, shortness of breath, dizziness, palpitations, or syncope.  They are recovering from their stroke with plans to home at discharge.  EP has been asked to evaluate for placement of an implantable loop recorder to monitor for atrial fibrillation.     Past Medical History  Diagnosis Date  . Hypertension   . Diabetes mellitus without complication ([place])   . GERD (gastroesophageal reflux disease)   . Gout   . Paroxysmal SVT (supraventricular tachycardia) ([facility])     a. s/p RFCA on 05/01/15     Surgical History:  Past Surgical History  Procedure Laterality Date  . Squamous cell carcinoma excision    . Basal cell carcinoma excision      off of back  . Electrophysiologic study N/A 05/01/2015    Procedure: SVT Ablation;  Surgeon: Evans Lance, MD;  Location: [place] [facility];  Service: Cardiovascular;  Laterality: N/A;     Prescriptions prior to admission  Medication Sig Dispense Refill Last Dose  . allopurinol (ZYLOPRIM) 100 MG tablet Take 100 mg by mouth daily.   06/01/2016 at Unknown time  . aspirin 325 MG EC tablet Take 325 mg by mouth daily.   06/01/2016 at 1000  . aspirin EC 81 MG tablet Take 162 mg by mouth once.    06/01/2016 at 1400  . atorvastatin (LIPITOR) 40 MG tablet Take 40 mg by mouth every morning.  0 06/01/2016 at Unknown time  . fluorouracil (EFUDEX) 5 % cream Apply 1 application topically daily as needed (for skin  cancer prevention).   0 6 months  . ibuprofen (ADVIL,MOTRIN) 200 MG tablet Take 400 mg by mouth daily as needed for moderate pain.    Past Week at Unknown time  . metFORMIN (GLUCOPHAGE) 500 MG tablet Take 1 tablet (500 mg total) by mouth 2 (two) times daily with a meal. (Patient taking differently: Take 500 mg by mouth daily with breakfast. ) 60 tablet 3 06/01/2016 at  Unknown time  . MICARDIS HCT 80-12.5 MG tablet TAKE ONE-HALF (1/2) TABLET DAILY (DISCARD REMAINDER AFTER OPENING). (Patient taking differently: TAKE ONE TABLET DAILY (DISCARD REMAINDER AFTER OPENING).) 90 tablet 1 06/01/2016 at Unknown time  . nitroGLYCERIN (NITROSTAT) 0.4 MG SL tablet Place 1 tablet (0.4 mg total) under the tongue every 5 (five) minutes as needed for chest pain. 25 tablet 3 1.5 years  . omeprazole (PRILOSEC) 20 MG capsule Take 20 mg by mouth every morning.    06/01/2016 at Unknown time    Inpatient Medications:  . allopurinol  100 mg Oral Daily  . aspirin  325 mg Oral Daily  . atorvastatin  40 mg Oral Daily  . irbesartan  150 mg Oral Daily   And  . hydrochlorothiazide  6.25 mg Oral Daily  . insulin aspart  0-15 Units Subcutaneous TID WC  . pantoprazole  40 mg Oral Daily    Allergies: No Known Allergies  Social History   Social History  . Marital Status: Married    Spouse Name: N/A  . Number of Children: N/A  . Years of Education: N/A   Occupational History  . Not on file.   Social History Main Topics  . Smoking status: Never Smoker   . Smokeless tobacco: Not on file  . Alcohol Use: No  . Drug Use: No  . Sexual Activity: Not on file   Other Topics Concern  . Not on file   Social History Narrative     Family History  Problem Relation Age of Onset  . Stroke Mother   . Hypertension Mother   . Alzheimer's disease Father   . Heart failure Father   . Down syndrome Daughter       Review of Systems: All other systems reviewed and are otherwise negative except as noted above.  Physical Exam: Filed Vitals:   06/03/16 2129 06/04/16 0127 06/04/16 0548 06/04/16 0952  BP: 102/68 123/69 113/66 96/60  Pulse: 72 73 76 69  Temp: 98 F (36.7 C) 97.9 F (36.6 C) 98.5 F (36.9 C) 98 F (36.7 C)  TempSrc: Oral Oral Oral Oral  Resp: 18 18 18 20   Height:      Weight:      SpO2: 98% 97% 96% 98%    GEN- The patient is well appearing, alert and oriented x  3 today.   Head- normocephalic, atraumatic Eyes-  Sclera clear, conjunctiva pink Ears- hearing intact Oropharynx- clear Neck- supple Lungs- Clear to ausculation bilaterally, normal work of breathing Heart- Regular rate and rhythm, no murmurs, rubs or gallops  GI- soft, NT, ND Extremities- no clubbing, cyanosis, or edema MS- no significant deformity or atrophy Skin- no rash or lesion Psych- euthymic mood, full affect   Labs:   Lab Results  Component Value Date   WBC 4.7 06/01/2016   HGB 12.6* 06/01/2016   HCT 37.0* 06/01/2016   MCV 88.9 06/01/2016   PLT 247 06/01/2016    Recent Labs Lab 06/01/16 1728 06/01/16 1730  NA 138 142  K 4.5 4.6  CL 103 102  CO2  26  --   BUN 21* 23*  CREATININE 1.16 1.10  CALCIUM 9.6  --   PROT 8.5*  --   BILITOT 0.4  --   ALKPHOS 61  --   ALT 12*  --   AST 25  --   GLUCOSE 120* 117*   Lab Results  Component Value Date   TROPONINI <0.30 12/08/2014   Lab Results  Component Value Date   CHOL 97 06/02/2016   CHOL 139 03/20/2016   CHOL 154 12/08/2014   Lab Results  Component Value Date   HDL 27* 06/02/2016   HDL 48 03/20/2016   HDL 36* 12/08/2014   Lab Results  Component Value Date   LDLCALC 52 06/02/2016   LDLCALC 72 03/20/2016   LDLCALC 95 12/08/2014   Lab Results  Component Value Date   TRIG 89 06/02/2016   TRIG 93 03/20/2016   TRIG 115 12/08/2014   Lab Results  Component Value Date   CHOLHDL 3.6 06/02/2016   CHOLHDL 2.9 03/20/2016   CHOLHDL 4.3 12/08/2014   No results found for: LDLDIRECT  Lab Results  Component Value Date   DDIMER 0.76* 12/07/2014     Radiology/Studies:  Dg Chest 2 View 06/01/2016  CLINICAL DATA:  Right arm numbness beginning today. EXAM: CHEST  2 VIEW COMPARISON:  01/30/2015 FINDINGS: Heart size is normal. There is calcification and unfolding of the thoracic aorta. The vascularity is normal. The lungs are clear. No effusions. Chronic degenerative changes affect the spine. IMPRESSION: No  active cardiopulmonary disease. Electronically Signed   By: Nelson Chimes M.D.   On: 06/01/2016 21:58   Ct Head Wo Contrast 06/01/2016  CLINICAL DATA:  Facial numbness r arm numbness Hx of stroke x2 wks ago Last normal 16:30 today EXAM: CT HEAD WITHOUT CONTRAST TECHNIQUE: Contiguous axial images were obtained from the base of the skull through the vertex without intravenous contrast. COMPARISON:  None. FINDINGS: Mild atrophy. Mild low attenuation in the deep white matter. No evidence of vascular territory infarct. No hydrocephalus. No hemorrhage or extra-axial fluid. Calvarium intact. IMPRESSION: Mild age-related involutional change with no acute findings. Critical Value/emergent results were called by telephone at the time of interpretation on 06/01/2016 at 5:34 pm to Dr. Leonel Ramsay , who verbally acknowledged these results. Electronically Signed   By: Skipper Cliche M.D.   On: 06/01/2016 17:34   Mr Brain Wo Contrast 06/01/2016  CLINICAL DATA:  Two week history of LEFT arm and facial numbness, diagnosed with stroke at outside institution. Evaluate arm numbness. History of hypertension and diabetes. EXAM: MRI HEAD WITHOUT CONTRAST MRA HEAD WITHOUT CONTRAST TECHNIQUE: Multiplanar, multiecho pulse sequences of the brain and surrounding structures were obtained without intravenous contrast. Angiographic images of the head were obtained using MRA technique without contrast. COMPARISON:  CT HEAD June 01, 2016 at 1727 hours FINDINGS: MRI HEAD FINDINGS INTRACRANIAL CONTENTS: No reduced diffusion to suggest acute ischemia. No susceptibility artifact to suggest hemorrhage. The ventricles and sulci are normal for patient's age. Patchy to confluent supratentorial and pontine white matter FLAIR T2 hyperintensities. No suspicious parenchymal signal, mass lesions, mass effect. Old bilateral basal ganglia lacunar infarcts. No abnormal extra-axial fluid collections. No extra-axial masses though, contrast enhanced sequences  would be more sensitive. Normal major intracranial vascular flow voids present at skull base. ORBITS: The included ocular globes and orbital contents are non-suspicious. SINUSES: Mild fronto ethmoidal mucosal thickening without air-fluid levels. Trace LEFT mastoid effusion. SKULL/SOFT TISSUES: No abnormal sellar expansion. No suspicious calvarial bone marrow signal.  Craniocervical junction maintained. 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 MRA HEAD FINDINGS ANTERIOR CIRCULATION: Normal flow related enhancement of the included cervical, petrous, cavernous and supraclinoid internal carotid arteries. Patent anterior communicating artery. Normal flow related enhancement of the anterior and middle cerebral arteries, including distal segments. No large vessel occlusion, high-grade stenosis, aneurysm. POSTERIOR CIRCULATION: Codominant vertebral artery. Basilar artery is patent, with normal flow related enhancement of the main branch vessels. Fetal origin RIGHT posterior cerebral artery. Normal flow related enhancement of the posterior cerebral arteries. No large vessel occlusion, high-grade stenosis, aneurysm. Very mild luminal irregularity of the mid to distal middle and posterior cerebral arteries most compatible with atherosclerosis. IMPRESSION: MRI HEAD: No acute intracranial process, specifically no acute ischemia. 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 MRA HEAD: No emergent large vessel occlusion or severe stenosis. Mild atherosclerosis. Electronically Signed   By: Elon Alas M.D.   On: 06/01/2016 22:02     12-lead ECG SR All prior EKG's in EPIC reviewed with no documented atrial fibrillation Telemetry SR, occ APCs, rare PVC, once 3beat V  salvo   Assessment and Plan:  1. Cryptogenic stroke The patient presents with cryptogenic stroke.  The patient has a TEE planned for this AM.  I spoke at length with the patient about monitoring for afib with either a 30 day event monitor or an implantable loop recorder.  Risks, benefits, and alteratives to implantable loop recorder were discussed with the patient today.   At this time, the patient is very clear in their decision to proceed with implantable loop recorder.   Wound care was reviewed with the patient (keep incision clean and dry for 3 days).  Wound check will be scheduled for the patient.  Please call with questions.   Baldwin Jamaica, PA-C 06/04/2016  I have seen, examined the patient, and reviewed the above assessment and plan.  On exam, RRR.  Changes to above are made where necessary.  I agree with Dr Leonie Man that ILR placement for long term monitoring for arrhythmias as a cause for recent stroke and TIA is appropriate.  Will proceed with ILR implant pending results of TEE.  Co Sign: Thompson Grayer, MD 06/04/2016 2:57 PM

## 2016-06-04 NOTE — H&P (Signed)
     INTERVAL PROCEDURE H&P  History and Physical Interval Note:  06/04/2016 2:59 PM  Justin [name] has presented today for their planned procedure. The various methods of treatment have been discussed with the patient and family. After consideration of risks, benefits and other options for treatment, the patient has consented to the procedure.  The patients' outpatient history has been reviewed, patient examined, and no change in status from most recent office note within the past 30 days. I have reviewed the patients' chart and labs and will proceed as planned. Questions were answered to the patient's satisfaction.   Pixie Casino, MD, [facility] Attending Cardiologist [place] M[place] Mcneish 06/04/2016, 2:59 PM

## 2016-06-04 NOTE — Progress Notes (Signed)
Consent signed and in chart. TEE information given at bedside.   Ave Filter, RN

## 2016-06-05 ENCOUNTER — Telehealth: Payer: Self-pay | Admitting: Internal Medicine

## 2016-06-05 ENCOUNTER — Encounter (HOSPITAL_COMMUNITY): Payer: Self-pay | Admitting: Internal Medicine

## 2016-06-05 DIAGNOSIS — G459 Transient cerebral ischemic attack, unspecified: Secondary | ICD-10-CM | POA: Diagnosis not present

## 2016-06-05 DIAGNOSIS — E119 Type 2 diabetes mellitus without complications: Secondary | ICD-10-CM

## 2016-06-05 LAB — BASIC METABOLIC PANEL WITH GFR
Anion gap: 7 (ref 5–15)
BUN: 22 mg/dL — ABNORMAL HIGH (ref 6–20)
CO2: 23 mmol/L (ref 22–32)
Calcium: 8.5 mg/dL — ABNORMAL LOW (ref 8.9–10.3)
Chloride: 107 mmol/L (ref 101–111)
Creatinine, Ser: 1.11 mg/dL (ref 0.61–1.24)
GFR calc Af Amer: >60 mL/min
GFR calc non Af Amer: >60 mL/min
Glucose, Bld: 113 mg/dL — ABNORMAL HIGH (ref 65–99)
Potassium: 4.4 mmol/L (ref 3.5–5.1)
Sodium: 137 mmol/L (ref 135–145)

## 2016-06-05 LAB — CBC
HCT: 30.2 % — ABNORMAL LOW (ref 39.0–52.0)
HEMOGLOBIN: 9.6 g/dL — AB (ref 13.0–17.0)
MCH: 27.7 pg (ref 26.0–34.0)
MCHC: 31.8 g/dL (ref 30.0–36.0)
MCV: 87.3 fL (ref 78.0–100.0)
Platelets: 249 10*3/uL (ref 150–400)
RBC: 3.46 MIL/uL — ABNORMAL LOW (ref 4.22–5.81)
RDW: 15.9 % — ABNORMAL HIGH (ref 11.5–15.5)
WBC: 7.7 10*3/uL (ref 4.0–10.5)

## 2016-06-05 LAB — GLUCOSE, CAPILLARY: Glucose-Capillary: 122 mg/dL — ABNORMAL HIGH (ref 65–99)

## 2016-06-05 MED ORDER — CLOPIDOGREL BISULFATE 75 MG PO TABS: 75.0000 mg | ORAL_TABLET | Freq: Every day | ORAL | Status: DC

## 2016-06-05 MED ORDER — TELMISARTAN-HCTZ 80-12.5 MG PO TABS: ORAL_TABLET | ORAL | Status: DC

## 2016-06-05 MED ORDER — METFORMIN HCL 500 MG PO TABS
1000.0000 mg | ORAL_TABLET | Freq: Two times a day (BID) | ORAL | Status: DC
Start: 2016-06-05 — End: 2017-02-21

## 2016-06-05 NOTE — Progress Notes (Signed)
Discharge orders received, Pt for discharge home today IV d/c'd. D/c instructions and RX given with verbalized understanding. Family at bedside to assist patient with discharge. Staff bought pt downstairs via wheelchair.

## 2016-06-05 NOTE — Discharge Summary (Signed)
Discharge Summary  Justin [name] [id number] DOB: December 10, 1941  PCP: Mayra Neer, MD  Admit date: 06/01/2016 Discharge date: 06/05/2016  Time spent: <76mins  Recommendations for Outpatient Follow-up:  1. F/u with PMD within a week  for hospital discharge follow up, repeat cbc/bmp at follow up, pmd to continue monitor blood pressure and blood sugar control 2. F/u with neurologist  Discharge Diagnoses:  Active Hospital Problems   Diagnosis Date Noted  . TIA (transient ischemic attack) 06/02/2016  . Numbness 06/01/2016  . Right arm numbness 06/01/2016  . CAD (coronary artery disease), native coronary artery 03/20/2016  . Hypertension   . GERD (gastroesophageal reflux disease)   . Gout   . Hyperlipidemia 12/13/2014  . Diabetes mellitus ([place]) 12/07/2014    Resolved Hospital Problems   Diagnosis Date Noted Date Resolved  No resolved problems to display.    Discharge Condition: stable  Diet recommendation: heart healthy/carb modified  Filed Weights   06/02/16 2120  Weight: 72.258 kg (159 lb 4.8 oz)    History of present illness:  Chief Complaint: "I just got numb."  HPI: Justin [name] is a 75 y.o. male with medical history significant for evaluation of right upper extremity right face numbness. Patient states that he had recent admission to outside hospital where he presented with left upper extremity and left face numbness. He was told MRI confirmed that he had a stroke at that time.Patient presented to our emergency room for concern for same. Patient denies any head trauma.  Denies any fevers, nausea, Vomiting and weakness.  Hospital Course:  Principal Problem:   TIA (transient ischemic attack) Active Problems:   Diabetes mellitus ([place])   Hyperlipidemia   Hypertension   GERD (gastroesophageal reflux disease)   Gout   CAD (coronary artery disease), native coronary artery   Numbness   Right arm numbness   TIA with recent h/o of acute cva - MRI brain with no  evidence of acute infarct, recent hospitalization at [facility] with acute CVA - 2-D echo with no evidence of embolic source, + PFO, venous doppler no DVT. - LDL 52, on Lipitor - Hemoglobin A1c is 7.5 - Carotid Doppler 1-39% ICA plaquing. Vertebral artery flow is antegrade - s/p loop recorder placement on 6/13,  Neurology recommended plavix, patient is discharge on plavix and outpatient follow up with neurology and cardiology, outpatient physical therapy  gout - continue allopurinol 100 mg daily  hyperlipidemia - Continue Lipitor 40 mg by mouth daily  Coronary artery disease - continue when necessary nitroglycerin sublingual 0.4 mg  noninsulin dependent Diabetes - Holding metformin 500 mg twice a day - ssi a1c 7.5, metformin dose increase at discharge  reflux - Continue PPI as protonix  Hypertension - When necessary hydralazine 10 mg IV as needed for severe blood pressure - Take one half tablet of micardis -bp meds need to continue to be monitored,   Code Status : Full  Family Communication : none at bedside  Disposition Plan : home   Consults : neurology  Procedures : TEE, loop recorder pending   Discharge Exam: BP 115/74 mmHg  Pulse 74  Temp(Src) 97.4 F (36.3 C) (Oral)  Resp 16  Ht 5\' 7"  (1.702 m)  Wt 72.258 kg (159 lb 4.8 oz)  BMI 24.94 kg/m2  SpO2 99%  General: aaox3 Cardiovascular: RRR Respiratory: CTABL  Discharge Instructions You were cared for by a hospitalist during your hospital stay. If you have any questions about your discharge medications or the care you received while  you were in the hospital after you are discharged, you can call the unit and asked to speak with the hospitalist on call if the hospitalist that took care of you is not available. Once you are discharged, your primary care physician will handle any further medical issues. Please note that NO REFILLS for any discharge medications will be authorized once you are  discharged, as it is imperative that you return to your primary care physician (or establish a relationship with a primary care physician if you do not have one) for your aftercare needs so that they can reassess your need for medications and monitor your lab values.      Discharge Instructions    Ambulatory referral to Neurology    Complete by:  As directed   Please schedule post stroke follow up in 2 months.     Ambulatory referral to Physical Therapy    Complete by:  As directed   Orthopedic Specialist with spine experience     Diet - low sodium heart healthy    Complete by:  As directed   Low carb     Increase activity slowly    Complete by:  As directed             Medication List    STOP taking these medications        aspirin 325 MG EC tablet     aspirin EC 81 MG tablet      TAKE these medications        allopurinol 100 MG tablet  Commonly known as:  ZYLOPRIM  Take 100 mg by mouth daily.     atorvastatin 40 MG tablet  Commonly known as:  LIPITOR  Take 40 mg by mouth every morning.     clopidogrel 75 MG tablet  Commonly known as:  PLAVIX  Take 1 tablet (75 mg total) by mouth daily.     fluorouracil 5 % cream  Commonly known as:  EFUDEX  Apply 1 application topically daily as needed (for skin cancer prevention).     ibuprofen 200 MG tablet  Commonly known as:  ADVIL,MOTRIN  Take 400 mg by mouth daily as needed for moderate pain.     metFORMIN 500 MG tablet  Commonly known as:  GLUCOPHAGE  Take 2 tablets (1,000 mg total) by mouth 2 (two) times daily with a meal.     nitroGLYCERIN 0.4 MG SL tablet  Commonly known as:  NITROSTAT  Place 1 tablet (0.4 mg total) under the tongue every 5 (five) minutes as needed for chest pain.     omeprazole 20 MG capsule  Commonly known as:  PRILOSEC  Take 20 mg by mouth every morning.     telmisartan-hydrochlorothiazide 80-12.5 MG tablet  Commonly known as:  MICARDIS HCT  TAKE ONE-HALF (1/2) TABLET DAILY (DISCARD  REMAINDER AFTER OPENING).       No Known Allergies Follow-up Information    Follow up with [facility] On 06/12/2016.   Specialty:  Cardiology   Why:  4:30PM, wound check   Contact information:   [address], [place] [phone]      Follow up with Antony Contras, MD.   Specialties:  Neurology, Radiology   Why:  Stroke Clinic, Office will call you with appointment date & time   Contact information:   [address] [place] [address] [phone]       Follow up with Mayra Neer, MD In 1  week.   Specialty:  Family Medicine   Why:  hospital discharge follow up   Contact information:   [address] [place]  [address] [phone]        The results of significant diagnostics from this hospitalization (including imaging, microbiology, ancillary and laboratory) are listed below for reference.    Significant Diagnostic Studies: Dg Chest 2 View  06/01/2016  CLINICAL DATA:  Right arm numbness beginning today. EXAM: CHEST  2 VIEW COMPARISON:  01/30/2015 FINDINGS: Heart size is normal. There is calcification and unfolding of the thoracic aorta. The vascularity is normal. The lungs are clear. No effusions. Chronic degenerative changes affect the spine. IMPRESSION: No active cardiopulmonary disease. Electronically Signed   By: Nelson Chimes M.D.   On: 06/01/2016 21:58   Ct Head Wo Contrast  06/01/2016  CLINICAL DATA:  Facial numbness r arm numbness Hx of stroke x2 wks ago Last normal 16:30 today EXAM: CT HEAD WITHOUT CONTRAST TECHNIQUE: Contiguous axial images were obtained from the base of the skull through the vertex without intravenous contrast. COMPARISON:  None. FINDINGS: Mild atrophy. Mild low attenuation in the deep white matter. No evidence of vascular territory infarct. No hydrocephalus. No hemorrhage or extra-axial fluid. Calvarium intact. IMPRESSION: Mild age-related involutional  change with no acute findings. Critical Value/emergent results were called by telephone at the time of interpretation on 06/01/2016 at 5:34 pm to Dr. Leonel Ramsay , who verbally acknowledged these results. Electronically Signed   By: Skipper Cliche M.D.   On: 06/01/2016 17:34   Mr Brain Wo Contrast  06/01/2016  CLINICAL DATA:  Two week history of LEFT arm and facial numbness, diagnosed with stroke at outside institution. Evaluate arm numbness. History of hypertension and diabetes. EXAM: MRI HEAD WITHOUT CONTRAST MRA HEAD WITHOUT CONTRAST TECHNIQUE: Multiplanar, multiecho pulse sequences of the brain and surrounding structures were obtained without intravenous contrast. Angiographic images of the head were obtained using MRA technique without contrast. COMPARISON:  CT HEAD June 01, 2016 at 1727 hours FINDINGS: MRI HEAD FINDINGS INTRACRANIAL CONTENTS: No reduced diffusion to suggest acute ischemia. No susceptibility artifact to suggest hemorrhage. The ventricles and sulci are normal for patient's age. Patchy to confluent supratentorial and pontine white matter FLAIR T2 hyperintensities. No suspicious parenchymal signal, mass lesions, mass effect. Old bilateral basal ganglia lacunar infarcts. No abnormal extra-axial fluid collections. No extra-axial masses though, contrast enhanced sequences would be more sensitive. Normal major intracranial vascular flow voids present at skull base. ORBITS: The included ocular globes and orbital contents are non-suspicious. SINUSES: Mild fronto ethmoidal mucosal thickening without air-fluid levels. Trace LEFT mastoid effusion. SKULL/SOFT TISSUES: No abnormal sellar expansion. No suspicious calvarial bone marrow signal. Craniocervical junction maintained. 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 MRA HEAD FINDINGS ANTERIOR CIRCULATION: Normal flow related enhancement of the included cervical, petrous,  cavernous and supraclinoid internal carotid arteries. Patent anterior communicating artery. Normal flow related enhancement of the anterior and middle cerebral arteries, including distal segments. No large vessel occlusion, high-grade stenosis, aneurysm. POSTERIOR CIRCULATION: Codominant vertebral artery. Basilar artery is patent, with normal flow related enhancement of the main branch vessels. Fetal origin RIGHT posterior cerebral artery. Normal flow related enhancement of the posterior cerebral arteries. No large vessel occlusion, high-grade stenosis, aneurysm. Very mild luminal irregularity of the mid to distal middle and posterior cerebral arteries most compatible with atherosclerosis. IMPRESSION: MRI HEAD: No acute intracranial process, specifically no  acute ischemia. 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 MRA HEAD: No emergent large vessel occlusion or severe stenosis. Mild atherosclerosis. Electronically Signed   By: Elon Alas M.D.   On: 06/01/2016 22:02   [name] Head/brain Wo Cm  06/01/2016  CLINICAL DATA:  Two week history of LEFT arm and facial numbness, diagnosed with stroke at outside institution. Evaluate arm numbness. History of hypertension and diabetes. EXAM: MRI HEAD WITHOUT CONTRAST MRA HEAD WITHOUT CONTRAST TECHNIQUE: Multiplanar, multiecho pulse sequences of the brain and surrounding structures were obtained without intravenous contrast. Angiographic images of the head were obtained using MRA technique without contrast. COMPARISON:  CT HEAD June 01, 2016 at 1727 hours FINDINGS: MRI HEAD FINDINGS INTRACRANIAL CONTENTS: No reduced diffusion to suggest acute ischemia. No susceptibility artifact to suggest hemorrhage. The ventricles and sulci are normal for patient's age. Patchy to confluent supratentorial and pontine  white matter FLAIR T2 hyperintensities. No suspicious parenchymal signal, mass lesions, mass effect. Old bilateral basal ganglia lacunar infarcts. No abnormal extra-axial fluid collections. No extra-axial masses though, contrast enhanced sequences would be more sensitive. Normal major intracranial vascular flow voids present at skull base. ORBITS: The included ocular globes and orbital contents are non-suspicious. SINUSES: Mild fronto ethmoidal mucosal thickening without air-fluid levels. Trace LEFT mastoid effusion. SKULL/SOFT TISSUES: No abnormal sellar expansion. No suspicious calvarial bone marrow signal. Craniocervical junction maintained. 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 MRA HEAD FINDINGS ANTERIOR CIRCULATION: Normal flow related enhancement of the included cervical, petrous, cavernous and supraclinoid internal carotid arteries. Patent anterior communicating artery. Normal flow related enhancement of the anterior and middle cerebral arteries, including distal segments. No large vessel occlusion, high-grade stenosis, aneurysm. POSTERIOR CIRCULATION: Codominant vertebral artery. Basilar artery is patent, with normal flow related enhancement of the main branch vessels. Fetal origin RIGHT posterior cerebral artery. Normal flow related enhancement of the posterior cerebral arteries. No large vessel occlusion, high-grade stenosis, aneurysm. Very mild luminal irregularity of the mid to distal middle and posterior cerebral arteries most compatible with atherosclerosis. IMPRESSION: MRI HEAD: No acute intracranial process, specifically no acute ischemia. 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 MRA HEAD: No emergent large vessel occlusion or severe stenosis. Mild  atherosclerosis. Electronically Signed   By: Elon Alas M.D.   On: 06/01/2016 22:02    Microbiology: No results found for this or any previous visit (from the past 240 hour(s)).   Labs: Basic Metabolic Panel:  Recent Labs Lab 06/01/16 1728 06/01/16 1730 06/05/16 0530  NA 138 142 137  K 4.5 4.6 4.4  CL 103 102 107  CO2 26  --  23  GLUCOSE 120* 117* 113*  BUN 21* 23* 22*  CREATININE 1.16 1.10 1.11  CALCIUM 9.6  --  8.5*   Liver Function Tests:  Recent Labs Lab 06/01/16 1728  AST 25  ALT 12*  ALKPHOS 61  BILITOT 0.4  PROT 8.5*  ALBUMIN 3.6   No results for input(s): LIPASE, AMYLASE in the last 168 hours. No results for input(s): AMMONIA in the last 168 hours. CBC:  Recent Labs Lab 06/01/16 1728 06/01/16 1730 06/05/16 0530  WBC 4.7  --  7.7  NEUTROABS 2.8  --   --   HGB 11.6* 12.6* 9.6*  HCT 36.0* 37.0* 30.2*  MCV 88.9  --  87.3  PLT 247  --  249   Cardiac Enzymes: No results for input(s): CKTOTAL, CKMB, CKMBINDEX, TROPONINI in the last 168 hours. BNP: BNP (last 3 results) No results for input(s): BNP in the last 8760 hours.  ProBNP (last 3 results) No results for input(s): PROBNP in the last 8760 hours.  CBG:  Recent Labs Lab 06/04/16 1112 06/04/16 1627 06/04/16 1650 06/04/16 2108 06/05/16 0610  GLUCAP 109* 63* 88 216* 122*       Signed:  Chariah Bailey MD, PhD  [facility] 06/05/2016, 2:31 PM

## 2016-06-05 NOTE — Progress Notes (Signed)
STROKE TEAM PROGRESS NOTE   SUBJECTIVE (INTERVAL HISTORY) Patient sitting up in the chair. States he would like to understand more about his loop. Dr. Leonie Man sat and discussed - reviewed dx, plan of care, tests and treatments, including search for atrial fibrillation.    OBJECTIVE Temp:  [97.4 F (36.3 C)-97.6 F (36.4 C)] 97.4 F (36.3 C) (06/14 0500) Pulse Rate:  [63-76] 74 (06/14 0500) Cardiac Rhythm:  [-] Normal sinus rhythm (06/14 0700) Resp:  [12-22] 16 (06/14 0500) BP: (115-149)/(64-79) 115/74 mmHg (06/14 0500) SpO2:  [94 %-100 %] 99 % (06/14 0500)  CBC:   Recent Labs Lab 06/01/16 1728 06/01/16 1730 06/05/16 0530  WBC 4.7  --  7.7  NEUTROABS 2.8  --   --   HGB 11.6* 12.6* 9.6*  HCT 36.0* 37.0* 30.2*  MCV 88.9  --  87.3  PLT 247  --  0000000    Basic Metabolic Panel:   Recent Labs Lab 06/01/16 1728 06/01/16 1730 06/05/16 0530  NA 138 142 137  K 4.5 4.6 4.4  CL 103 102 107  CO2 26  --  23  GLUCOSE 120* 117* 113*  BUN 21* 23* 22*  CREATININE 1.16 1.10 1.11  CALCIUM 9.6  --  8.5*    Lipid Panel:     Component Value Date/Time   CHOL 97 06/02/2016 0229   TRIG 89 06/02/2016 0229   HDL 27* 06/02/2016 0229   CHOLHDL 3.6 06/02/2016 0229   VLDL 18 06/02/2016 0229   LDLCALC 52 06/02/2016 0229   HgbA1c:  Lab Results  Component Value Date   HGBA1C 7.5* 06/02/2016    IMAGING  TEE 1. Small bidirectional PFO noted. 2. Bilobar left atrial appendage without thrombus. 3. Mild MR. 4. Aortic valve prolapse with trivial AI. 5. LVEF 55-60%  Bilateral lower extremity venous duplex   Bilateral: No evidence of DVT, superficial thrombosis, or Baker's Cyst.  Carotid Doppler   There is 1-39% bilateral ICA stenosis. Vertebral artery flow is antegrade.     PHYSICAL EXAM Physical Exam  Constitutional: Appears well-developed and well-nourished.  Psych: Affect appropriate to situation Eyes: No scleral injection HENT: No OP obstrucion Head: Normocephalic.   Cardiovascular: Normal rate and regular rhythm.  Respiratory: Effort normal and breath sounds normal to anterior ascultation GI: Soft. No distension. There is no tenderness.  Skin: WDI  Neuro: Mental Status: Patient is awake, alert, oriented to person, place, month, year, and situation. Patient is able to give a clear and coherent history. No signs of aphasia or neglect Cranial Nerves: II: Visual Fields are full. Pupils are equal, round, and reactive to light.  III,IV, VI: EOMI without ptosis or diploplia.  V: Facial sensation is decreased around the left corner of his mouth (old finding) VII: Facial movement is symmetric.  VIII: hearing is intact to voice X: Uvula elevates symmetrically XI: Shoulder shrug is symmetric. XII: tongue is midline without atrophy or fasciculations.  Motor: Tone is normal. Bulk is normal. 5/5 strength was present in all four extremities.  Sensory: Sensation is symmetric to light touch and temperature in the arms and legs. Deep Tendon Reflexes: 2+ and symmetric in the biceps and patellae.  Plantars: Toes are downgoing bilaterally.  Cerebellar: FNF and HKS are intact bilaterally   ASSESSMENT/PLAN [name] is a 75 y.o. male with history of previous strokes, hypertension, diabetes mellitus, gout, and paroxysmal SVT presenting with right arm and facial numbness. He did not receive IV t-PA due to resolution of deficits.  L  brain TIA  Resultant resolution of deficits  MRI  No acute intracranial process, Old bilateral basal ganglia lacunar infarcts.   MRA  moderate to severe small vessel disease.  Carotid Doppler No significant stenosis   2D Echo - EF 60-65%. No cardiac source of emboli identified.  TEE shows PFO - PFO (patent foramen ovale) likely an incidental finding. Will check LE venous dopplers for DVT as possible cause of stroke.   LDL - 52  HgbA1c 7.5  LE doppler neg for DVT  VTE prophylaxis - SCDs Diet Heart  Room service appropriate?: Yes; Fluid consistency:: Thin  aspirin 325 mg daily prior to admission but had not been taking long. now on aspirin 325 mg daily. Given stroke on aspirin, recommend change to plavix. Order written  Patient counseled to be compliant with his antithrombotic medications  Ongoing aggressive stroke risk factor management  Therapy recommendations: No OT or ST, OP PT  Disposition: return home with OP PT NOTHING FURTHER TO ADD FROM THE STROKE STANDPOINT Patient has a 10-15% risk of having another stroke over the next year, the highest risk is within 2 weeks of the most recent stroke/TIA (risk of having a stroke following a stroke or TIA is the same). Ongoing risk factor control by Primary Care Physician Stroke Service will sign off. Please call should any needs arise. Follow-up Stroke Clinic at [facility] with Dr. Antony Contras in 2 months, order placed.  Hypertension  Blood pressure tends to run mildly low on Avapro HCT  Long-term BP goal normotensive  Hyperlipidemia  Home meds:  Lipitor 40 mg daily resumed in hospital  LDL 52, goal < 70  Continue statin at discharge  Diabetes  HgbA1c 7.5, goal < 7.0  Controlled  Other Stroke Risk Factors  Advanced age  Obesity, Body mass index is 24.94 kg/(m^2)., recommend weight loss, diet and exercise as appropriate   Hx stroke/TIA  Family hx stroke (Mother)  Newly found PFO, felt to be an incidental finding. He plans to discuss PFO further with his cardiologist, Dr. Debara Pickett. He has an appt already scheduled for tomorrow.  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 Patient is aware and has an appointment  Hospital day #   Talmage Matteson for Pager information 06/05/2016 10:53 AM   I have personally examined this patient, reviewed notes, independently viewed imaging studies,  participated in medical decision making and plan of care. I have made any additions or clarifications directly to the above note. Agree with note above. Newly found PFO, felt to be an incidental finding. He plans to discuss PFO further with his cardiologist, Dr. Debara Pickett. I discussed with him results of recent studies suggesting possible benefit of endovascular PFO closure but I recommend patient try maximal medical therapy first. If he has recurrent TIAs/strokes despite medical therapy may consider elective PFO closure in the future. Greater than 50% of time during this 25 minute visit was spent on counseling and coordination of care about stroke risk, PFO and stroke prevention Stroke team will sign off. Kindly call for questions if any. Antony Contras, MD Medical Director [facility] Stroke Center Pager: [phone] 06/05/2016 1:32 PM  To contact Stroke Continuity provider, please refer to [email or web address] After hours, contact General Neurology

## 2016-06-05 NOTE — Care Management Note (Signed)
Case Management Note  Patient Details  Name: Justin [name] MRN: [id number] Date of Birth: February 21, 1941  Subjective/Objective:                    Action/Plan: Patient discharging home with self care. Orders placed for ambulatory outpatient PT. CM printed the order and gave to patient for orthopedic PT specialist. Bedside RN updated.   Expected Discharge Date:                  Expected Discharge Plan:  Home/Self Care  In-House Referral:     Discharge planning Services  CM Consult  Post Acute Care Choice:    Choice offered to:     DME Arranged:    DME Agency:     HH Arranged:    [place] Agency:     Status of Service:  Completed, signed off  Medicare Important Message Given:    Date Medicare IM Given:    Medicare IM give by:    Date Additional Medicare IM Given:    Additional Medicare Important Message give by:     If discussed at [place] of Stay Meetings, dates discussed:    Additional Comments:  Pollie Friar, RN 06/05/2016, 2:42 PM

## 2016-06-05 NOTE — Telephone Encounter (Signed)
New Message  Pt calling concerned about appt on 6/15. Pt stated he had a procedure done on 6/13 at [facility] and wasn't clear if Dr. Debara Pickett was going to cancel his appt. I did let pt know the appt for 6/15 was still scheduled . Pt still wants the appt to be schedule and wanted the RN to be notify he doesn't want the appt canceled. Please call back if discussion is needed.

## 2016-06-06 ENCOUNTER — Encounter: Payer: Self-pay | Admitting: Internal Medicine

## 2016-06-06 ENCOUNTER — Ambulatory Visit (INDEPENDENT_AMBULATORY_CARE_PROVIDER_SITE_OTHER): Payer: Medicare Other | Admitting: Internal Medicine

## 2016-06-06 VITALS — BP 123/73 | HR 83 | Ht 68.0 in | Wt 171.6 lb

## 2016-06-06 DIAGNOSIS — I10 Essential (primary) hypertension: Secondary | ICD-10-CM | POA: Diagnosis not present

## 2016-06-06 DIAGNOSIS — I639 Cerebral infarction, unspecified: Secondary | ICD-10-CM

## 2016-06-06 DIAGNOSIS — I253 Aneurysm of heart: Secondary | ICD-10-CM | POA: Insufficient documentation

## 2016-06-06 DIAGNOSIS — Z8673 Personal history of transient ischemic attack (TIA), and cerebral infarction without residual deficits: Secondary | ICD-10-CM | POA: Insufficient documentation

## 2016-06-06 DIAGNOSIS — E119 Type 2 diabetes mellitus without complications: Secondary | ICD-10-CM

## 2016-06-06 DIAGNOSIS — Q2112 Patent foramen ovale: Secondary | ICD-10-CM | POA: Insufficient documentation

## 2016-06-06 DIAGNOSIS — G459 Transient cerebral ischemic attack, unspecified: Secondary | ICD-10-CM | POA: Diagnosis not present

## 2016-06-06 DIAGNOSIS — Q211 Atrial septal defect: Secondary | ICD-10-CM

## 2016-06-06 HISTORY — DX: Aneurysm of heart: I25.3

## 2016-06-06 NOTE — Progress Notes (Signed)
OFFICE NOTE  Chief Complaint:  Hospital follow-up  Primary Care Physician: Mayra Neer, MD  HPI:  Justin [name] is a pleasant 75 year old male who I met recently in the hospital. He presented with chest pain that he developed while sitting his computer. The symptoms had some typical and atypical features for angina. He ruled out for MI and underwent nuclear stress testing which was negative for ischemia. During the stress test while exercising he developed an SVT with heart rate that shot up abruptly to 220. This responded to vagal maneuvers and then reoccurred and eventually subsided. He was started on low-dose beta blocker and eventually discharged without any recurrent symptoms. He was placed on a monitor and has follow-up with cardiac electrophysiology to see Dr. Lovena Le in January. 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 He was started on Lipitor which was started the hospital for for coronary disease, however his LDL is only 95. He is a diabetic. His metformin was increased to 500 twice a day which is improved his blood sugars. He recently saw his primary care provider felt that he was doing well.  I saw Justin [name] back in the office today. He was referred to Dr. Cristopher Peru for SVT ablation consideration, and this was offered to him. Prior to having this scheduled he developed an episode of chest pain again and that brought him to the hospital yesterday. He was evaluated by Dr. Dorris Carnes, and had a right upper quadrant ultrasound which was unremarkable. He also had coronary CT angiogram which demonstrated mild to moderate coronary disease and an intermediate coronary calcium score of 202. He has subsequently worn a monitor between 12/21/2015 and 01/18/2015 which showed no recurrent SVT and normal sinus rhythm.  The etiology of his pain episodes is unclear however may be  related to reflux.   Justin [name] returns today for follow-up. He reports he is done very well without any recurrent SVT status post catheter ablation by Dr. Cristopher Peru. He is seeing me primarily for risk factor modification and treatment of asymptomatic coronary artery disease which was seen by coronary artery CT angiography. He was found to have 2 areas of mild coronary artery disease and a calcium score which was moderately elevated at 202. Since then he's been on lipid therapy with Lipitor and takes daily aspirin as well as my card is HCTZ for hypertension. Blood pressure is well-controlled today 106/60. He is due for repeat lipid profile and will need a refill on his cholesterol medication. He denies any chest pain or shortness of breath. 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  06/06/2016  Justin [name] returns today for follow-up. He was seen by myself in the hospital after he presented with symptoms of possible TIA. This is following recent hospitalization at [facility] in [place] where he had a stroke. MRI confirmed this although he has had no significant lasting deficit. His initial symptoms were left arm heaviness and numbness. He also subsequently had some right arm tingling but the symptoms were different than his original presentation. This was thought to be TIA. He was evaluated by neurology however repeat imaging failed to show any new deficits. Subsequently he was referred for a TEE which I performed. This demonstrated a small bidirectional PFO. He had a bifid left atrial appendage without  any thrombus. While he did have PFO, it's not clear that this was the etiology of his stroke or TIA. Venous Dopplers of the legs were negative for thrombus. He was changed from aspirin to Plavix. We also recommended and implanted loop recorder which was placed by Dr. Rayann Heman. The thought is that he may be having intermittent atrial fibrillation, as he has a  history of SVT underwent ablation.  PMHx:  Past Medical History  Diagnosis Date  . Hypertension   . Diabetes mellitus without complication ([place])   . GERD (gastroesophageal reflux disease)   . Gout   . Paroxysmal SVT (supraventricular tachycardia) (H[facility])     a. s/p RFCA on 05/01/15    Past Surgical History  Procedure Laterality Date  . Squamous cell carcinoma excision    . Basal cell carcinoma excision      off of back  . Electrophysiologic study N/A 05/01/2015    Procedure: SVT Ablation;  Surgeon: Evans Lance, MD;  Location: [facility];  Service: Cardiovascular;  Laterality: N/A;  . Ep implantable device N/A 06/04/2016    Procedure: Loop Recorder Insertion;  Surgeon: Thompson Grayer, MD;  Location: [facility];  Service: Cardiovascular;  Laterality: N/A;  . Tee without cardioversion N/A 06/04/2016    Procedure: TRANSESOPHAGEAL ECHOCARDIOGRAM (TEE);  Surgeon: Pixie Casino, MD;  Location: [facility];  Service: Cardiovascular;  Laterality: N/A;    FAMHx:  Family History  Problem Relation Age of Onset  . Stroke Mother   . Hypertension Mother   . Alzheimer's disease Father   . Heart failure Father   . Down syndrome Daughter     SOCHx:   reports that he has never smoked. He does not have any smokeless tobacco history on file. He reports that he does not drink alcohol or use illicit drugs.  ALLERGIES:  No Known Allergies  ROS: Pertinent items noted in HPI and remainder of comprehensive ROS otherwise negative.  HOME MEDS: Current Outpatient Prescriptions  Medication Sig Dispense Refill  . allopurinol (ZYLOPRIM) 100 MG tablet Take 100 mg by mouth daily.    [name] Kitchen atorvastatin (LIPITOR) 40 MG tablet Take 40 mg by mouth every morning.  0  . clopidogrel (PLAVIX) 75 MG tablet Take 1 tablet (75 mg total) by mouth daily. 30 tablet 0  . fluorouracil (EFUDEX) 5 % cream Apply 1 application topically daily as needed (for skin cancer prevention).   0  . ibuprofen  (ADVIL,MOTRIN) 200 MG tablet Take 400 mg by mouth daily as needed for moderate pain.     . metFORMIN (GLUCOPHAGE) 500 MG tablet Take 2 tablets (1,000 mg total) by mouth 2 (two) times daily with a meal. 60 tablet 3  . nitroGLYCERIN (NITROSTAT) 0.4 MG SL tablet Place 1 tablet (0.4 mg total) under the tongue every 5 (five) minutes as needed for chest pain. 25 tablet 3  . omeprazole (PRILOSEC) 20 MG capsule Take 20 mg by mouth every morning.     [name] Kitchen telmisartan-hydrochlorothiazide (MICARDIS HCT) 80-12.5 MG tablet TAKE ONE-HALF (1/2) TABLET DAILY (DISCARD REMAINDER AFTER OPENING). 90 tablet 1   No current facility-administered medications for this visit.    LABS/IMAGING: Results for orders placed or performed during the hospital encounter of 06/01/16 (from the past 48 hour(s))  Glucose, capillary     Status: Abnormal   Collection Time: 06/04/16  9:08 PM  Result Value Ref Range   Glucose-Capillary 216 (H) 65 - 99 mg/dL   Comment 1 Notify RN  Comment 2 Document in Chart   CBC     Status: Abnormal   Collection Time: 06/05/16  5:30 AM  Result Value Ref Range   WBC 7.7 4.0 - 10.5 K/uL   RBC 3.46 (L) 4.22 - 5.81 MIL/uL   Hemoglobin 9.6 (L) 13.0 - 17.0 g/dL   HCT 30.2 (L) 39.0 - 52.0 %   MCV 87.3 78.0 - 100.0 fL   MCH 27.7 26.0 - 34.0 pg   MCHC 31.8 30.0 - 36.0 g/dL   RDW 15.9 (H) 11.5 - 15.5 %   Platelets 249 150 - 400 K/uL  Basic metabolic panel     Status: Abnormal   Collection Time: 06/05/16  5:30 AM  Result Value Ref Range   Sodium 137 135 - 145 mmol/L   Potassium 4.4 3.5 - 5.1 mmol/L   Chloride 107 101 - 111 mmol/L   CO2 23 22 - 32 mmol/L   Glucose, Bld 113 (H) 65 - 99 mg/dL   BUN 22 (H) 6 - 20 mg/dL   Creatinine, Ser 1.11 0.61 - 1.24 mg/dL   Calcium 8.5 (L) 8.9 - 10.3 mg/dL   GFR calc non Af Amer >60 >60 mL/min   GFR calc Af Amer >60 >60 mL/min    Comment: (NOTE) The eGFR has been calculated using the CKD EPI equation. This calculation has not been validated in all clinical  situations. eGFR's persistently <60 mL/min signify possible Chronic Kidney Disease.    Anion gap 7 5 - 15  Glucose, capillary     Status: Abnormal   Collection Time: 06/05/16  6:10 AM  Result Value Ref Range   Glucose-Capillary 122 (H) 65 - 99 mg/dL   Comment 1 Notify RN    Comment 2 Document in Chart    No results found.  VITALS: BP 123/73 mmHg  Pulse 83  Ht 5' 8"  (1.727 m)  Wt 171 lb 9.6 oz (77.837 kg)  BMI 26.10 kg/m2  EXAM: Deferred  EKG: Deferred  ASSESSMENT: 1. Stroke and TIA, status post ILR 2. Small bidirectional PFO by TEE 3. Mild CAD - coronary CT angiogram showed mild to moderate coronary disease which was nonobstructive and an intermediate coronary calcium score of 202. 4. PSVT s/p ablation by Dr. Lovena Le 5. Diabetes type 2 6. Relative dyslipidemia  PLAN: 1.   Justin [name] unfortunately had a recent stroke and may have had a TIA. TEE demonstrated small bidirectional PFO however there is not clear enough evidence that this is associated or actually is causative of his stroke. I'm also concerned about the possibility of intermittent A. fib. He underwent implantation of an ILR and we will follow that to evaluate for any occult atrial fibrillation. His aspirin was increased to Plavix and this should provide adequate benefit in the short-term. Should he have stroke despite this change and there is no evidence of atrial fibrillation on his monitor, then we may consider referral for PFO closure.  Follow-up with me in 6 months.  Pixie Casino, MD, [facility] Attending Cardiologist [address] Maleke Feria 06/06/2016, 5:53 PM

## 2016-06-06 NOTE — Patient Instructions (Signed)
Your physician wants you to follow-up in: 6 months with Dr. Hilty. You will receive a reminder letter in the mail two months in advance. If you don't receive a letter, please call our office to schedule the follow-up appointment.    

## 2016-06-10 ENCOUNTER — Ambulatory Visit: Payer: Medicare Other | Admitting: Neurology

## 2016-06-12 ENCOUNTER — Ambulatory Visit (INDEPENDENT_AMBULATORY_CARE_PROVIDER_SITE_OTHER): Payer: Medicare Other | Admitting: *Deleted

## 2016-06-12 ENCOUNTER — Encounter: Payer: Self-pay | Admitting: Internal Medicine

## 2016-06-12 DIAGNOSIS — G459 Transient cerebral ischemic attack, unspecified: Secondary | ICD-10-CM

## 2016-06-12 LAB — CUP PACEART INCLINIC DEVICE CHECK: MDC IDC SESS DTM: 20170621165003

## 2016-06-12 NOTE — Progress Notes (Signed)
Wound check in clinic s/p ILR implant. Wound well healed without redness or edema. Incision edges approximated. Normal device function. Battery status: Good. R-waves 0.51mV. 0 symptom episodes, 0 tachy episodes, 0 pause episodes, 0 brady episodes. 0 AF episodes (0% burden). Patient education completed. Monthly summary reports and ROV with JA prn.

## 2016-06-24 ENCOUNTER — Telehealth: Payer: Self-pay

## 2016-06-24 DIAGNOSIS — M545 Low back pain: Secondary | ICD-10-CM | POA: Diagnosis not present

## 2016-06-24 MED ORDER — CLOPIDOGREL BISULFATE 75 MG PO TABS: 75.0000 mg | ORAL_TABLET | Freq: Every day | ORAL | Status: DC

## 2016-06-24 MED ORDER — ATORVASTATIN CALCIUM 40 MG PO TABS: 40.0000 mg | ORAL_TABLET | ORAL | Status: DC

## 2016-06-24 NOTE — Telephone Encounter (Signed)
Refill sent to the pharmacy electronically.  

## 2016-06-24 NOTE — Telephone Encounter (Signed)
Pt called stating he only has 1 week left of his Plavix and Lipitor and that [facility] told him that they have tried to contact the office and cannot get the refills approved. He would like to have a 30 day refill sent to [facility] on [address] and a 90 day refill sent to [facility]. Pt request a call back to confirm that both Rx have been sent

## 2016-06-27 DIAGNOSIS — I10 Essential (primary) hypertension: Secondary | ICD-10-CM | POA: Diagnosis not present

## 2016-06-27 DIAGNOSIS — I639 Cerebral infarction, unspecified: Secondary | ICD-10-CM | POA: Diagnosis not present

## 2016-06-27 DIAGNOSIS — K119 Disease of salivary gland, unspecified: Secondary | ICD-10-CM | POA: Diagnosis not present

## 2016-06-27 DIAGNOSIS — Q211 Atrial septal defect: Secondary | ICD-10-CM | POA: Diagnosis not present

## 2016-06-27 DIAGNOSIS — E119 Type 2 diabetes mellitus without complications: Secondary | ICD-10-CM | POA: Diagnosis not present

## 2016-06-27 DIAGNOSIS — Z7902 Long term (current) use of antithrombotics/antiplatelets: Secondary | ICD-10-CM | POA: Diagnosis not present

## 2016-07-04 ENCOUNTER — Ambulatory Visit (INDEPENDENT_AMBULATORY_CARE_PROVIDER_SITE_OTHER): Payer: Medicare Other | Admitting: *Deleted

## 2016-07-04 DIAGNOSIS — G459 Transient cerebral ischemic attack, unspecified: Secondary | ICD-10-CM | POA: Diagnosis not present

## 2016-07-05 NOTE — Progress Notes (Signed)
Carelink Summary Report / Loop Recorder 

## 2016-07-08 DIAGNOSIS — K118 Other diseases of salivary glands: Secondary | ICD-10-CM

## 2016-07-08 DIAGNOSIS — K119 Disease of salivary gland, unspecified: Secondary | ICD-10-CM | POA: Diagnosis not present

## 2016-07-08 HISTORY — DX: Other diseases of salivary glands: K11.8

## 2016-07-12 LAB — CUP PACEART REMOTE DEVICE CHECK: Date Time Interrogation Session: 20170713210616

## 2016-07-13 DIAGNOSIS — L259 Unspecified contact dermatitis, unspecified cause: Secondary | ICD-10-CM | POA: Diagnosis not present

## 2016-07-30 ENCOUNTER — Ambulatory Visit: Payer: Medicare Other | Admitting: Neurology

## 2016-08-05 ENCOUNTER — Ambulatory Visit (INDEPENDENT_AMBULATORY_CARE_PROVIDER_SITE_OTHER): Payer: Medicare Other | Admitting: *Deleted

## 2016-08-05 DIAGNOSIS — G459 Transient cerebral ischemic attack, unspecified: Secondary | ICD-10-CM

## 2016-08-05 NOTE — Progress Notes (Signed)
Carelink Summary Report / Loop Recorder 

## 2016-08-07 ENCOUNTER — Encounter: Payer: Self-pay | Admitting: Internal Medicine

## 2016-08-13 ENCOUNTER — Telehealth: Payer: Self-pay | Admitting: Internal Medicine

## 2016-08-13 NOTE — Telephone Encounter (Signed)
Justin [name] denies redness, swelling or drainage at ILR site. He reports a sensitive knot at the site and he doesn't recall it being there in the past. He wants to get it checked prior to traveling. He will come tomorrow to the device clinic at 3:30pm.

## 2016-08-13 NOTE — Telephone Encounter (Signed)
New Message   1. Has your device fired? No  2. Is you device beeping? No  3. Are you experiencing draining or swelling at device site? Swelling/Lump and sensitive to the touch also.  4. Are you calling to see if we received your device transmission? No  5. Have you passed out? No  Pt voiced he'll be going out of the [place] for 17 days and would like to know if he needs to come in before he leaves or if it's something that's normal.  Please follow up with pt. Thanks!

## 2016-08-14 ENCOUNTER — Ambulatory Visit: Payer: Medicare Other

## 2016-08-14 ENCOUNTER — Ambulatory Visit (INDEPENDENT_AMBULATORY_CARE_PROVIDER_SITE_OTHER): Payer: Medicare Other | Admitting: *Deleted

## 2016-08-14 DIAGNOSIS — Z9889 Other specified postprocedural states: Secondary | ICD-10-CM

## 2016-08-14 NOTE — Progress Notes (Signed)
Mr. Painton presents today for "knot" at loop recorder site. Pimple noted at loop recorder insertion site (ILR placed 06/04/16). No pain upon palpation, no swelling, redness or drainage noted. Skin cleaned with alcohol prep pad and pimple opened with suture-removal tweezers, manually expressed a very small amount of pus and blood. Nothing able to be expressed from ILR pocket. Site cleaned again. Patient educated to cleanse area with antibacterial soap, warm water and wash cloth twice daily and to monitor site for redness, swelling or drainage. Patient knows to call [facility] if he experiences any of these symptoms or fever/chills. Carelink monitor sending daily transmissions- no new episodes.

## 2016-08-31 LAB — CUP PACEART REMOTE DEVICE CHECK: MDC IDC SESS DTM: 20170812214402

## 2016-09-02 ENCOUNTER — Ambulatory Visit (INDEPENDENT_AMBULATORY_CARE_PROVIDER_SITE_OTHER): Payer: Medicare Other | Admitting: *Deleted

## 2016-09-02 DIAGNOSIS — G459 Transient cerebral ischemic attack, unspecified: Secondary | ICD-10-CM

## 2016-09-02 DIAGNOSIS — I639 Cerebral infarction, unspecified: Secondary | ICD-10-CM

## 2016-09-03 ENCOUNTER — Encounter: Payer: Self-pay | Admitting: Neurology

## 2016-09-03 ENCOUNTER — Ambulatory Visit (INDEPENDENT_AMBULATORY_CARE_PROVIDER_SITE_OTHER): Payer: Medicare Other | Admitting: Neurology

## 2016-09-03 VITALS — BP 110/70 | HR 65 | Ht 68.0 in | Wt 166.4 lb

## 2016-09-03 DIAGNOSIS — I639 Cerebral infarction, unspecified: Secondary | ICD-10-CM | POA: Diagnosis not present

## 2016-09-03 NOTE — Progress Notes (Signed)
Carelink Summary Report / Loop Recorder 

## 2016-09-03 NOTE — Progress Notes (Signed)
[facility] [address] [place]. [place] [address] [phone]       OFFICE FOLLOW-UP NOTE  [name] Date of Birth:  August 18, 1941 Medical Record Number:  [id number]   HPI:  75 year old male seen today for first office follow-up visit for hospital admission for TIA in June 2017 Justin [name] is a 75 y.o. male with a history of SVT who presented to Hospital in [place] 2 weeks ago with left arm and facial numbness. MRI revealed 2 tiny areas of infarct in the same distribution. I'm not sure what workup was done at that time, but he did have a CTA head and neck as well as MRI. Oncology 06/01/16, he had sudden onset right arm and face numbness. It resolved over the course of approximately 30 minutes. He states that this was similar to the episode that he had prior, except that is on the opposite side. It is completely resolved at this point. He denies any difficulty with speech, or weakness. LKW: 1630 on 06/01/16 tpa given?: no, resolved symptoms . MRI scan the brain showed no acute process but showed old bilateral basal ganglia lacunar infarcts and moderate changes of chronic small vessel disease. 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 MRA of the brain showed no large vessel occlusion or stenosis. Carotid Doppler showed no significant extracranial stenosis. Transthoracic echo and transesophageal echo both did not show any cardiac source of embolism. TEE showed a small PFO with bidirectional shunt fissures not felt to be of clinical significance LDL cholesterol 52 mg percent. Hemoglobin A1c was elevated at 7.5. Patient was previously on aspirin which was switched to Plavix for stroke prevention. He states his done well. He states that  2 weeks prior to his admission to [facility] for TIA he was admitted in [facility] with the right MCA branch embolic strokes following  which was started on aspirin. He is now tolerating Plavix well without bleeding or bruising. He is also on Lipitor which is tolerating well without muscle aches and pains. He states his blood pressure is well controlled and today it is 110/70. He states his fasting sugars have been better now and he plans to repeat hemoglobin A1c next week. He has no new complaints today. He had a loop recorder inserted on 6/13 117 and so for paroxysmal atrial fibrillation has not yet been to detected. ROS:   14 system review of systems is positive for  chills, aching muscles, muscle cramps, memory loss and all other systems negative  PMH:  Past Medical History:  Diagnosis Date  . Diabetes mellitus without complication ([place])   . GERD (gastroesophageal reflux disease)   . Gout   . Hypertension   . Paroxysmal SVT (supraventricular tachycardia) ([place])    a. s/p RFCA on 05/01/15  . Stroke [facility])     Social History:  Social History   Social History  . Marital status: Married    Spouse name: N/A  . Number of children: N/A  . Years of education: N/A   Occupational History  . Not on file.   Social History Main Topics  . Smoking status: Never Smoker  . Smokeless tobacco: Never Used  . Alcohol use No  . Drug use: No  . Sexual activity: Not on file   Other Topics Concern  . Not on file   Social History Narrative  . No narrative on file    Medications:  Current Outpatient Prescriptions on File Prior to Visit  Medication Sig Dispense Refill  . atorvastatin (LIPITOR) 40 MG tablet Take 1 tablet (40 mg total) by mouth every morning. 90 tablet 3  . clopidogrel (PLAVIX) 75 MG tablet Take 1 tablet (75 mg total) by mouth daily. 90 tablet 3  . fluorouracil (EFUDEX) 5 % cream Apply 1 application topically daily as needed (for skin cancer prevention).   0  . ibuprofen (ADVIL,MOTRIN) 200 MG tablet Take 400 mg by mouth daily as needed for moderate pain.     . metFORMIN (GLUCOPHAGE) 500 MG tablet Take 2 tablets  (1,000 mg total) by mouth 2 (two) times daily with a meal. (Patient taking differently: Take 1,000 mg by mouth 2 (two) times daily with a meal. ) 60 tablet 3  . nitroGLYCERIN (NITROSTAT) 0.4 MG SL tablet Place 1 tablet (0.4 mg total) under the tongue every 5 (five) minutes as needed for chest pain. 25 tablet 3  . omeprazole (PRILOSEC) 20 MG capsule Take 20 mg by mouth every morning.     [name] Kitchen telmisartan-hydrochlorothiazide (MICARDIS HCT) 80-12.5 MG tablet TAKE ONE-HALF (1/2) TABLET DAILY (DISCARD REMAINDER AFTER OPENING). 90 tablet 1   No current facility-administered medications on file prior to visit.     Allergies:  No Known Allergies  Physical Exam General: well developed, well nourished, seated, in no evident distress Head: head normocephalic and atraumatic.  Neck: supple with no carotid or supraclavicular bruits Cardiovascular: regular rate and rhythm, no murmurs Musculoskeletal: no deformity Skin:  no rash/petichiae Vascular:  Normal pulses all extremities Vitals:   09/03/16 0910  BP: 110/70  Pulse: 65   Neurologic Exam Mental Status: Awake and fully alert. Oriented to place and time. Recent and remote memory intact. Attention span, concentration and fund of knowledge appropriate. Mood and affect appropriate.  Cranial Nerves: Fundoscopic exam reveals sharp disc margins. Pupils equal, briskly reactive to light. Extraocular movements full without nystagmus. Visual fields full to confrontation. Hearing intact. Facial sensation intact. Face, tongue, palate moves normally and symmetrically.  Motor: Normal bulk and tone. Normal strength in all tested extremity muscles. Sensory.: intact to touch ,pinprick .position and vibratory sensation.  Coordination: Rapid alternating movements normal in all extremities. Finger-to-nose and heel-to-shin performed accurately bilaterally. Gait and Station: Arises from chair without difficulty. Stance is normal. Gait demonstrates normal stride length and  balance . Able to heel, toe and tandem walk without difficulty.  Reflexes: 1+ and symmetric. Toes downgoing.   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 Vascular risk factors of diabetes, hypertension, hyperlipidemia.Small PFO unlikely to be of clinical significance    PLAN: I had a long d/w patient about his recent cryptogenic stroke  , risk for recurrent stroke/TIAs, personally independently reviewed imaging studies and stroke evaluation results and answered questions.Continue Plavix 75 mg daily  for secondary stroke prevention and maintain strict control of hypertension with blood pressure goal below 130/90, diabetes with hemoglobin A1c goal below 6.5% and lipids with LDL cholesterol goal below 70 mg/dL. I also advised the patient to eat a healthy diet with plenty of whole grains, cereals, fruits and vegetables, exercise regularly and maintain ideal body weight .I also discussed with him briefly possible interest in participation in the RESPECT ESUS stroke prevention trial. He will be given information to review at home and decide. Followup in  the future with my stroke nurse practitioner in 6 months or call earlier if necessary Greater than 50% of time during this 25 minute visit was spent on counseling,explanation of diagnosis, planning of further management, discussion with patient and family and coordination of care Antony Contras, MD  [facility] [address] [place], [place] [address]  Phone [phone] Fax [phone] Note: This document was prepared with digital dictation and possible smart phrase technology. Any transcriptional errors that result from this process are unintentional

## 2016-09-03 NOTE — Patient Instructions (Signed)
I had a long d/w patient about his recent cryptogenic stroke  , risk for recurrent stroke/TIAs, personally independently reviewed imaging studies and stroke evaluation results and answered questions.Continue Plavix 75 mg daily  for secondary stroke prevention and maintain strict control of hypertension with blood pressure goal below 130/90, diabetes with hemoglobin A1c goal below 6.5% and lipids with LDL cholesterol goal below 70 mg/dL. I also advised the patient to eat a healthy diet with plenty of whole grains, cereals, fruits and vegetables, exercise regularly and maintain ideal body weight .I also discussed with him briefly possible interest in participation in the RESPECT ESUS stroke prevention trial. He will be given information to review at home and decide. Followup in the future with my stroke nurse practitioner in 6 months or call earlier if necessary  Stroke Prevention Some medical conditions and behaviors are associated with an increased chance of having a stroke. You may prevent a stroke by making healthy choices and managing medical conditions. HOW CAN I REDUCE MY RISK OF HAVING A STROKE?   Stay physically active. Get at least 30 minutes of activity on most or all days.  Do not smoke. It may also be helpful to avoid exposure to secondhand smoke.  Limit alcohol use. Moderate alcohol use is considered to be:  No more than 2 drinks per day for men.  No more than 1 drink per day for nonpregnant women.  Eat healthy foods. This involves:  Eating 5 or more servings of fruits and vegetables a day.  Making dietary changes that address high blood pressure (hypertension), high cholesterol, diabetes, or obesity.  Manage your cholesterol levels.  Making food choices that are high in fiber and low in saturated fat, trans fat, and cholesterol may control cholesterol levels.  Take any prescribed medicines to control cholesterol as directed by your health care provider.  Manage your  diabetes.  Controlling your carbohydrate and sugar intake is recommended to manage diabetes.  Take any prescribed medicines to control diabetes as directed by your health care provider.  Control your hypertension.  Making food choices that are low in salt (sodium), saturated fat, trans fat, and cholesterol is recommended to manage hypertension.  Ask your health care provider if you need treatment to lower your blood pressure. Take any prescribed medicines to control hypertension as directed by your health care provider.  If you are 73-12 years of age, have your blood pressure checked every 3-5 years. If you are 50 years of age or older, have your blood pressure checked every year.  Maintain a healthy weight.  Reducing calorie intake and making food choices that are low in sodium, saturated fat, trans fat, and cholesterol are recommended to manage weight.  Stop drug abuse.  Avoid taking birth control pills.  Talk to your health care provider about the risks of taking birth control pills if you are over 14 years old, smoke, get migraines, or have ever had a blood clot.  Get evaluated for sleep disorders (sleep apnea).  Talk to your health care provider about getting a sleep evaluation if you snore a lot or have excessive sleepiness.  Take medicines only as directed by your health care provider.  For some people, aspirin or blood thinners (anticoagulants) are helpful in reducing the risk of forming abnormal blood clots that can lead to stroke. If you have the irregular heart rhythm of atrial fibrillation, you should be on a blood thinner unless there is a good reason you cannot take them.  Understand  all your medicine instructions.  Make sure that other conditions (such as anemia or atherosclerosis) are addressed. SEEK IMMEDIATE MEDICAL CARE IF:   You have sudden weakness or numbness of the face, arm, or leg, especially on one side of the body.  Your face or eyelid droops to one  side.  You have sudden confusion.  You have trouble speaking (aphasia) or understanding.  You have sudden trouble seeing in one or both eyes.  You have sudden trouble walking.  You have dizziness.  You have a loss of balance or coordination.  You have a sudden, severe headache with no known cause.  You have new chest pain or an irregular heartbeat. Any of these symptoms may represent a serious problem that is an emergency. Do not wait to see if the symptoms will go away. Get medical help at once. Call your local emergency services (911 in [place] Do not drive yourself to the hospital.   This information is not intended to replace advice given to you by your health care provider. Make sure you discuss any questions you have with your health care provider.   Document Released: 01/16/2005 Document Revised: 12/30/2014 Document Reviewed: 06/11/2013 Elsevier Interactive Patient Education [employer].

## 2016-09-11 DIAGNOSIS — R972 Elevated prostate specific antigen [PSA]: Secondary | ICD-10-CM | POA: Diagnosis not present

## 2016-09-11 DIAGNOSIS — Z8673 Personal history of transient ischemic attack (TIA), and cerebral infarction without residual deficits: Secondary | ICD-10-CM | POA: Diagnosis not present

## 2016-09-11 DIAGNOSIS — I471 Supraventricular tachycardia: Secondary | ICD-10-CM | POA: Diagnosis not present

## 2016-09-11 DIAGNOSIS — Z23 Encounter for immunization: Secondary | ICD-10-CM | POA: Diagnosis not present

## 2016-09-11 DIAGNOSIS — E1165 Type 2 diabetes mellitus with hyperglycemia: Secondary | ICD-10-CM | POA: Diagnosis not present

## 2016-09-11 DIAGNOSIS — Z7984 Long term (current) use of oral hypoglycemic drugs: Secondary | ICD-10-CM | POA: Diagnosis not present

## 2016-09-11 DIAGNOSIS — I10 Essential (primary) hypertension: Secondary | ICD-10-CM | POA: Diagnosis not present

## 2016-09-11 DIAGNOSIS — Z0000 Encounter for general adult medical examination without abnormal findings: Secondary | ICD-10-CM | POA: Diagnosis not present

## 2016-09-11 DIAGNOSIS — D509 Iron deficiency anemia, unspecified: Secondary | ICD-10-CM | POA: Diagnosis not present

## 2016-09-11 DIAGNOSIS — M549 Dorsalgia, unspecified: Secondary | ICD-10-CM | POA: Diagnosis not present

## 2016-09-11 DIAGNOSIS — I2510 Atherosclerotic heart disease of native coronary artery without angina pectoris: Secondary | ICD-10-CM | POA: Diagnosis not present

## 2016-09-11 DIAGNOSIS — M109 Gout, unspecified: Secondary | ICD-10-CM | POA: Diagnosis not present

## 2016-09-13 DIAGNOSIS — E8809 Other disorders of plasma-protein metabolism, not elsewhere classified: Secondary | ICD-10-CM | POA: Diagnosis not present

## 2016-09-23 DIAGNOSIS — M549 Dorsalgia, unspecified: Secondary | ICD-10-CM | POA: Diagnosis not present

## 2016-09-25 DIAGNOSIS — M549 Dorsalgia, unspecified: Secondary | ICD-10-CM | POA: Diagnosis not present

## 2016-09-28 LAB — CUP PACEART REMOTE DEVICE CHECK: MDC IDC SESS DTM: 20170911220747

## 2016-09-28 NOTE — Progress Notes (Signed)
Carelink summary report received. Battery status OK. Normal device function. No new symptom episodes, tachy episodes, brady, or pause episodes. 1 AF episode, SR with PAC's.  Monthly summary reports and ROV/PRN

## 2016-09-30 DIAGNOSIS — M549 Dorsalgia, unspecified: Secondary | ICD-10-CM | POA: Diagnosis not present

## 2016-10-01 ENCOUNTER — Encounter: Payer: Self-pay | Admitting: Hematology

## 2016-10-01 ENCOUNTER — Telehealth: Payer: Self-pay | Admitting: Hematology

## 2016-10-01 NOTE — Telephone Encounter (Signed)
Pt returned call to schedule and appt. Appt scheduled w/Kale on 10/27 at 830am. Patient aware to arrive 30 minutes early. Letter mailed to the pt.

## 2016-10-02 ENCOUNTER — Ambulatory Visit (INDEPENDENT_AMBULATORY_CARE_PROVIDER_SITE_OTHER): Payer: Medicare Other | Admitting: *Deleted

## 2016-10-02 DIAGNOSIS — I639 Cerebral infarction, unspecified: Secondary | ICD-10-CM

## 2016-10-03 NOTE — Progress Notes (Signed)
Carelink Summary Repot / Loop Recorder

## 2016-10-04 DIAGNOSIS — M549 Dorsalgia, unspecified: Secondary | ICD-10-CM | POA: Diagnosis not present

## 2016-10-08 DIAGNOSIS — M549 Dorsalgia, unspecified: Secondary | ICD-10-CM | POA: Diagnosis not present

## 2016-10-11 DIAGNOSIS — M549 Dorsalgia, unspecified: Secondary | ICD-10-CM | POA: Diagnosis not present

## 2016-10-15 ENCOUNTER — Telehealth: Payer: Self-pay | Admitting: Hematology

## 2016-10-15 DIAGNOSIS — M549 Dorsalgia, unspecified: Secondary | ICD-10-CM | POA: Diagnosis not present

## 2016-10-15 NOTE — Telephone Encounter (Signed)
PAL - CALLED PATEINT TO R/S 10/27 NPHEMA APPOINTMENT TO 11/9. SPOKE WITH PATIENT HE IS AWARE.

## 2016-10-18 ENCOUNTER — Encounter: Payer: Medicare Other | Admitting: Hematology

## 2016-10-18 DIAGNOSIS — E119 Type 2 diabetes mellitus without complications: Secondary | ICD-10-CM | POA: Diagnosis not present

## 2016-10-18 DIAGNOSIS — H2513 Age-related nuclear cataract, bilateral: Secondary | ICD-10-CM | POA: Diagnosis not present

## 2016-10-18 DIAGNOSIS — M549 Dorsalgia, unspecified: Secondary | ICD-10-CM | POA: Diagnosis not present

## 2016-10-18 DIAGNOSIS — H25013 Cortical age-related cataract, bilateral: Secondary | ICD-10-CM | POA: Diagnosis not present

## 2016-10-18 DIAGNOSIS — H52203 Unspecified astigmatism, bilateral: Secondary | ICD-10-CM | POA: Diagnosis not present

## 2016-10-20 DIAGNOSIS — L723 Sebaceous cyst: Secondary | ICD-10-CM | POA: Diagnosis not present

## 2016-10-21 ENCOUNTER — Telehealth: Payer: Self-pay | Admitting: Cardiology

## 2016-10-21 NOTE — Telephone Encounter (Signed)
Pt called and stated that he looked at his monitor and the date showed 10-09-16. He wanted to know why he hadn't received a call. I informed him that we look at a list every week and if his monitor was ever to go more than 14 days w/o updating we would give him a call. Pt had concerns about this b/c he expects a call sooner. I informed him that this is our protocol and that when I made calls last week it was for pt's whose monitor had not updated since 10-05-16. Informed him that when I made those calls his monitor had updated on 10-09-16 and that meant it was still ok. Pt verbalized understanding.

## 2016-10-22 DIAGNOSIS — M549 Dorsalgia, unspecified: Secondary | ICD-10-CM | POA: Diagnosis not present

## 2016-10-25 ENCOUNTER — Emergency Department (HOSPITAL_COMMUNITY): Payer: Medicare Other

## 2016-10-25 ENCOUNTER — Encounter (HOSPITAL_COMMUNITY): Payer: Self-pay | Admitting: Emergency Medicine

## 2016-10-25 ENCOUNTER — Inpatient Hospital Stay (HOSPITAL_COMMUNITY)
Admission: EM | Admit: 2016-10-25 | Discharge: 2016-10-30 | DRG: 062 | Disposition: A | Discharge status: Home Health | Payer: Medicare Other | Attending: Neurology | Admitting: Neurology

## 2016-10-25 DIAGNOSIS — R269 Unspecified abnormalities of gait and mobility: Secondary | ICD-10-CM

## 2016-10-25 DIAGNOSIS — I63421 Cerebral infarction due to embolism of right anterior cerebral artery: Secondary | ICD-10-CM | POA: Diagnosis present

## 2016-10-25 DIAGNOSIS — M109 Gout, unspecified: Secondary | ICD-10-CM | POA: Diagnosis present

## 2016-10-25 DIAGNOSIS — Z85828 Personal history of other malignant neoplasm of skin: Secondary | ICD-10-CM

## 2016-10-25 DIAGNOSIS — Z95818 Presence of other cardiac implants and grafts: Secondary | ICD-10-CM

## 2016-10-25 DIAGNOSIS — Z8673 Personal history of transient ischemic attack (TIA), and cerebral infarction without residual deficits: Secondary | ICD-10-CM

## 2016-10-25 DIAGNOSIS — Z833 Family history of diabetes mellitus: Secondary | ICD-10-CM

## 2016-10-25 DIAGNOSIS — I639 Cerebral infarction, unspecified: Secondary | ICD-10-CM | POA: Diagnosis not present

## 2016-10-25 DIAGNOSIS — Z79899 Other long term (current) drug therapy: Secondary | ICD-10-CM

## 2016-10-25 DIAGNOSIS — M549 Dorsalgia, unspecified: Secondary | ICD-10-CM | POA: Diagnosis not present

## 2016-10-25 DIAGNOSIS — Q211 Atrial septal defect: Secondary | ICD-10-CM | POA: Diagnosis not present

## 2016-10-25 DIAGNOSIS — I10 Essential (primary) hypertension: Secondary | ICD-10-CM | POA: Diagnosis present

## 2016-10-25 DIAGNOSIS — I63411 Cerebral infarction due to embolism of right middle cerebral artery: Secondary | ICD-10-CM | POA: Diagnosis not present

## 2016-10-25 DIAGNOSIS — R531 Weakness: Secondary | ICD-10-CM | POA: Diagnosis present

## 2016-10-25 DIAGNOSIS — Z7902 Long term (current) use of antithrombotics/antiplatelets: Secondary | ICD-10-CM

## 2016-10-25 DIAGNOSIS — K219 Gastro-esophageal reflux disease without esophagitis: Secondary | ICD-10-CM | POA: Diagnosis present

## 2016-10-25 DIAGNOSIS — R270 Ataxia, unspecified: Secondary | ICD-10-CM

## 2016-10-25 DIAGNOSIS — R404 Transient alteration of awareness: Secondary | ICD-10-CM | POA: Diagnosis not present

## 2016-10-25 DIAGNOSIS — I63321 Cerebral infarction due to thrombosis of right anterior cerebral artery: Secondary | ICD-10-CM | POA: Diagnosis not present

## 2016-10-25 DIAGNOSIS — E785 Hyperlipidemia, unspecified: Secondary | ICD-10-CM | POA: Diagnosis present

## 2016-10-25 DIAGNOSIS — R29898 Other symptoms and signs involving the musculoskeletal system: Secondary | ICD-10-CM

## 2016-10-25 DIAGNOSIS — I471 Supraventricular tachycardia: Secondary | ICD-10-CM

## 2016-10-25 DIAGNOSIS — D62 Acute posthemorrhagic anemia: Secondary | ICD-10-CM | POA: Diagnosis not present

## 2016-10-25 DIAGNOSIS — Z823 Family history of stroke: Secondary | ICD-10-CM

## 2016-10-25 DIAGNOSIS — E119 Type 2 diabetes mellitus without complications: Secondary | ICD-10-CM | POA: Diagnosis present

## 2016-10-25 DIAGNOSIS — I6381 Other cerebral infarction due to occlusion or stenosis of small artery: Secondary | ICD-10-CM

## 2016-10-25 DIAGNOSIS — G8194 Hemiplegia, unspecified affecting left nondominant side: Secondary | ICD-10-CM | POA: Diagnosis not present

## 2016-10-25 LAB — URINALYSIS, ROUTINE W REFLEX MICROSCOPIC
BILIRUBIN URINE: NEGATIVE
Glucose, UA: NEGATIVE mg/dL
Hgb urine dipstick: NEGATIVE
KETONES UR: NEGATIVE mg/dL
LEUKOCYTES UA: NEGATIVE
NITRITE: NEGATIVE
PH: 5.5 (ref 5.0–8.0)
PROTEIN: NEGATIVE mg/dL
Specific Gravity, Urine: 1.008 (ref 1.005–1.030)

## 2016-10-25 LAB — CBC
HCT: 31 % — ABNORMAL LOW (ref 39.0–52.0)
HEMOGLOBIN: 10.4 g/dL — AB (ref 13.0–17.0)
MCH: 29.4 pg (ref 26.0–34.0)
MCHC: 33.5 g/dL (ref 30.0–36.0)
MCV: 87.6 fL (ref 78.0–100.0)
PLATELETS: 269 10*3/uL (ref 150–400)
RBC: 3.54 MIL/uL — AB (ref 4.22–5.81)
RDW: 16.8 % — ABNORMAL HIGH (ref 11.5–15.5)
WBC: 2.9 10*3/uL — AB (ref 4.0–10.5)

## 2016-10-25 LAB — COMPREHENSIVE METABOLIC PANEL
ALBUMIN: 3.2 g/dL — AB (ref 3.5–5.0)
ALK PHOS: 51 U/L (ref 38–126)
ALT: 7 U/L — AB (ref 17–63)
AST: 18 U/L (ref 15–41)
Anion gap: 11 (ref 5–15)
BILIRUBIN TOTAL: 0.5 mg/dL (ref 0.3–1.2)
BUN: 25 mg/dL — AB (ref 6–20)
CALCIUM: 9.2 mg/dL (ref 8.9–10.3)
CO2: 21 mmol/L — AB (ref 22–32)
CREATININE: 1.01 mg/dL (ref 0.61–1.24)
Chloride: 104 mmol/L (ref 101–111)
GFR calc Af Amer: >60 mL/min (ref >60)
GFR calc non Af Amer: >60 mL/min (ref >60)
GLUCOSE: 151 mg/dL — AB (ref 65–99)
Potassium: 3.9 mmol/L (ref 3.5–5.1)
SODIUM: 136 mmol/L (ref 135–145)
TOTAL PROTEIN: 7.7 g/dL (ref 6.5–8.1)

## 2016-10-25 LAB — DIFFERENTIAL
Basophils Absolute: 0 10*3/uL (ref 0.0–0.1)
Basophils Relative: 0 %
Eosinophils Absolute: 0.1 10*3/uL (ref 0.0–0.7)
Eosinophils Relative: 4 %
LYMPHS ABS: 1.2 10*3/uL (ref 0.7–4.0)
LYMPHS PCT: 41 %
Monocytes Absolute: 0.2 10*3/uL (ref 0.1–1.0)
Monocytes Relative: 5 %
NEUTROS ABS: 1.5 10*3/uL — AB (ref 1.7–7.7)
NEUTROS PCT: 50 %

## 2016-10-25 LAB — RAPID URINE DRUG SCREEN, HOSP PERFORMED
AMPHETAMINES: NOT DETECTED
BARBITURATES: NOT DETECTED
Benzodiazepines: NOT DETECTED
Cocaine: NOT DETECTED
OPIATES: NOT DETECTED
TETRAHYDROCANNABINOL: NOT DETECTED

## 2016-10-25 LAB — I-STAT CHEM 8, ED
BUN: 27 mg/dL — AB (ref 6–20)
CALCIUM ION: 1.11 mmol/L — AB (ref 1.15–1.40)
CHLORIDE: 102 mmol/L (ref 101–111)
Creatinine, Ser: 1 mg/dL (ref 0.61–1.24)
GLUCOSE: 153 mg/dL — AB (ref 65–99)
HCT: 32 % — ABNORMAL LOW (ref 39.0–52.0)
Hemoglobin: 10.9 g/dL — ABNORMAL LOW (ref 13.0–17.0)
POTASSIUM: 3.9 mmol/L (ref 3.5–5.1)
SODIUM: 140 mmol/L (ref 135–145)
TCO2: 24 mmol/L (ref 0–100)

## 2016-10-25 LAB — CBG MONITORING, ED: Glucose-Capillary: 120 mg/dL — ABNORMAL HIGH (ref 65–99)

## 2016-10-25 LAB — APTT: aPTT: 39 seconds — ABNORMAL HIGH (ref 24–36)

## 2016-10-25 LAB — I-STAT TROPONIN, ED: Troponin i, poc: 0 ng/mL (ref 0.00–0.08)

## 2016-10-25 LAB — PROTIME-INR
INR: 1.03
PROTHROMBIN TIME: 13.6 s (ref 11.4–15.2)

## 2016-10-25 MED ORDER — ACETAMINOPHEN 325 MG PO TABS
650.0000 mg | ORAL_TABLET | Freq: Once | ORAL | Status: AC
  Administered 2016-10-26: 650 mg via ORAL
  Filled 2016-10-25: qty 2

## 2016-10-25 MED ORDER — SODIUM CHLORIDE 0.9 % IV SOLN
50.0000 mL | Freq: Once | INTRAVENOUS | Status: AC
  Administered 2016-10-25: 50 mL via INTRAVENOUS

## 2016-10-25 MED ORDER — ALTEPLASE (STROKE) FULL DOSE INFUSION
0.9000 mg/kg | Freq: Once | INTRAVENOUS | Status: AC
  Administered 2016-10-25: 67 mg via INTRAVENOUS
  Filled 2016-10-25: qty 100

## 2016-10-25 NOTE — Code Documentation (Addendum)
Code stroke called at 2213 for this white male 75 y/o pt  with a history of prior stroke in May 2017who is on plavix.  Pt was at home and  LSW at 2030.He began to experience weakness in his LLE and LUE and had balance issues so his wife called EMS.  Pt arrived at [facility] at 2204, the code stroke waqs called at 2213 and was cleared by EDP for CT scan at 2220. Upon return to [place] pt scored a 2 on the NIHSS with points given for LLE weakness and ataxia in LLE.   CT results of no acute intracranial process was called to Dr Reather Converse at 2235. The decision to administer TPA was made at 2238 by Dr Nicole Kindred At which time pharmacy was notified to mix the drug.  TPA was delivered to the bedside at 2243 with full dose TPA  7 mg initiated at 2247 followed by infusion of 67 mg/hr.  An ICU bed was requested at 2243.  Pt will be admitted to the Neuro ICU

## 2016-10-25 NOTE — ED Notes (Signed)
Checked CBG 120, RN Clarise Cruz informed

## 2016-10-25 NOTE — H&P (Addendum)
Admission H&P    Chief Complaint: Acute loss of control of left lower extremity with weakness and numbness.  HPI: Justin [name] is an 75 y.o. male history of stroke in May 2017, TIA in June 2017, PFO, diabetes mellitus and hypertension, presenting with new onset loss of control of his left lower extremity with weakness and numbness. Onset was at 8:30 PM tonight. He's been taking Plavix 75 mg per day. CT scan of his head showed old bilateral basal ganglia infarctions, but no acute findings. NIH stroke score was 3. Patient was deemed a candidate for TPA because of the significant disability, with inability to bear weight weight and control movement of his left lower extremity acutely.  LSN: 8:30 PM on 10/25/2016 tPA Given: Yes mRankin:  Past Medical History:  Diagnosis Date  . Diabetes mellitus without complication ([place])   . GERD (gastroesophageal reflux disease)   . Gout   . Hypertension   . Paroxysmal SVT (supraventricular tachycardia) ([place])    a. s/p RFCA on 05/01/15  . Stroke [facility])     Past Surgical History:  Procedure Laterality Date  . BASAL CELL CARCINOMA EXCISION     off of back  . ELECTROPHYSIOLOGIC STUDY N/A 05/01/2015   Procedure: SVT Ablation;  Surgeon: Evans Lance, MD;  Location: [place] CV LAB;  Service: Cardiovascular;  Laterality: N/A;  . EP IMPLANTABLE DEVICE N/A 06/04/2016   Procedure: Loop Recorder Insertion;  Surgeon: Thompson Grayer, MD;  Location: [place] [facility] LAB;  Service: Cardiovascular;  Laterality: N/A;  . SQUAMOUS CELL CARCINOMA EXCISION    . TEE WITHOUT CARDIOVERSION N/A 06/04/2016   Procedure: TRANSESOPHAGEAL ECHOCARDIOGRAM (TEE);  Surgeon: Pixie Casino, MD;  Location: [facility] ENDOSCOPY;  Service: Cardiovascular;  Laterality: N/A;    Family History  Problem Relation Age of Onset  . Stroke Mother   . Hypertension Mother   . Alzheimer's disease Father   . Heart failure Father   . Down syndrome Daughter    Social History:  reports that he has never  smoked. He has never used smokeless tobacco. He reports that he does not drink alcohol or use drugs.  Allergies: No Known Allergies  Medications: Preadmission medications were reviewed by me.  ROS: History obtained from the patient  General ROS: negative for - chills, fatigue, fever, night sweats, weight gain or weight loss Psychological ROS: negative for - behavioral disorder, hallucinations, memory difficulties, mood swings or suicidal ideation Ophthalmic ROS: negative for - blurry vision, double vision, eye pain or loss of vision ENT ROS: negative for - epistaxis, nasal discharge, oral lesions, sore throat, tinnitus or vertigo Allergy and Immunology ROS: negative for - hives or itchy/watery eyes Hematological and Lymphatic ROS: negative for - bleeding problems, bruising or swollen lymph nodes Endocrine ROS: negative for - galactorrhea, hair pattern changes, polydipsia/polyuria or temperature intolerance Respiratory ROS: negative for - cough, hemoptysis, shortness of breath or wheezing Cardiovascular ROS: negative for - chest pain, dyspnea on exertion, edema or irregular heartbeat Gastrointestinal ROS: negative for - abdominal pain, diarrhea, hematemesis, nausea/vomiting or stool incontinence Genito-Urinary ROS: negative for - dysuria, hematuria, incontinence or urinary frequency/urgency Musculoskeletal ROS: negative for - joint swelling or muscular weakness Neurological ROS: as noted in HPI Dermatological ROS: negative for rash and skin lesion changes  Physical Examination: Blood pressure 126/86, pulse 80, temperature 97.5 F (36.4 C), temperature source Oral, resp. rate 19, height 5\' 9"  (1.753 m), weight 74.8 kg (165 lb), SpO2 99 %.  HEENT-  Normocephalic, no lesions, without  obvious abnormality.  Normal external eye and conjunctiva.  Normal TM's bilaterally.  Normal auditory canals and external ears. Normal external nose, mucus membranes and septum.  Normal pharynx. Neck supple  with no masses, nodes, nodules or enlargement. Cardiovascular - regular rate and rhythm, S1, S2 normal, no murmur, click, rub or gallop Lungs - chest clear, no wheezing, rales, normal symmetric air entry Abdomen - soft, non-tender; bowel sounds normal; no masses,  no organomegaly Extremities - no joint deformities, effusion, or inflammation and no edema  Neurologic Examination: Mental Status: Alert, oriented, thought content appropriate.  Speech fluent without evidence of aphasia. Able to follow commands without difficulty. Cranial Nerves: II-Visual fields were normal. III/IV/VI-Pupils were equal and reacted normally to light. Extraocular movements were full and conjugate.    V/VII-no facial numbness and no facial weakness. VIII-normal. X-normal speech and symmetrical palatal movement. XI: trapezius strength/neck flexion strength normal bilaterally XII-midline tongue extension with normal strength. Motor: Mild drift of left lower extremity; 4/5 strength of left hip flexors with manual testing; motor exam otherwise unremarkable. Sensory: Reduced perception of tactile sensation over left lower extremity compared to right lower extremity. Deep Tendon Reflexes: 2+ and symmetric. Plantars: Flexor on the right and extensor on the left Cerebellar: Normal finger-to-nose testing bilaterally; moderately impaired heel to shin testing with lower extremity. Carotid auscultation: Normal  Results for orders placed or performed during the hospital encounter of 10/25/16 (from the past 48 hour(s))  CBC     Status: Abnormal   Collection Time: 10/25/16 10:11 PM  Result Value Ref Range   WBC 2.9 (L) 4.0 - 10.5 K/uL   RBC 3.54 (L) 4.22 - 5.81 MIL/uL   Hemoglobin 10.4 (L) 13.0 - 17.0 g/dL   HCT 31.0 (L) 39.0 - 52.0 %   MCV 87.6 78.0 - 100.0 fL   MCH 29.4 26.0 - 34.0 pg   MCHC 33.5 30.0 - 36.0 g/dL   RDW 16.8 (H) 11.5 - 15.5 %   Platelets 269 150 - 400 K/uL  Differential     Status: Abnormal    Collection Time: 10/25/16 10:11 PM  Result Value Ref Range   Neutrophils Relative % 50 %   Neutro Abs 1.5 (L) 1.7 - 7.7 K/uL   Lymphocytes Relative 41 %   Lymphs Abs 1.2 0.7 - 4.0 K/uL   Monocytes Relative 5 %   Monocytes Absolute 0.2 0.1 - 1.0 K/uL   Eosinophils Relative 4 %   Eosinophils Absolute 0.1 0.0 - 0.7 K/uL   Basophils Relative 0 %   Basophils Absolute 0.0 0.0 - 0.1 K/uL  CBG monitoring, ED     Status: Abnormal   Collection Time: 10/25/16 10:17 PM  Result Value Ref Range   Glucose-Capillary 120 (H) 65 - 99 mg/dL  I-stat troponin, ED (not at [facility], [facility])     Status: None   Collection Time: 10/25/16 10:22 PM  Result Value Ref Range   Troponin i, poc 0.00 0.00 - 0.08 ng/mL   Comment 3            Comment: Due to the release kinetics of cTnI, a negative result within the first hours of the onset of symptoms does not rule out myocardial infarction with certainty. If myocardial infarction is still suspected, repeat the test at appropriate intervals.   I-Stat Chem 8, ED  (not at [facility], [facility])     Status: Abnormal   Collection Time: 10/25/16 10:24 PM  Result Value Ref Range   Sodium 140 135 - 145  mmol/L   Potassium 3.9 3.5 - 5.1 mmol/L   Chloride 102 101 - 111 mmol/L   BUN 27 (H) 6 - 20 mg/dL   Creatinine, Ser 1.00 0.61 - 1.24 mg/dL   Glucose, Bld 153 (H) 65 - 99 mg/dL   Calcium, Ion 1.11 (L) 1.15 - 1.40 mmol/L   TCO2 24 0 - 100 mmol/L   Hemoglobin 10.9 (L) 13.0 - 17.0 g/dL   HCT 32.0 (L) 39.0 - 52.0 %   Ct Head Code Stroke W/o Cm  Result Date: 10/25/2016 CLINICAL DATA:  Code stroke. LEFT-sided weakness, unsteady gait. Last seen normal at 2030 hours. History of hypertension and diabetes, stroke. EXAM: CT HEAD WITHOUT CONTRAST TECHNIQUE: Contiguous axial images were obtained from the base of the skull through the vertex without intravenous contrast. COMPARISON:  MRI head June 01, 2016 9 CT HEAD June 01, 2016 FINDINGS: BRAIN: The ventricles and sulci are normal for age. No  intraparenchymal hemorrhage, mass effect nor midline shift. Patchy supratentorial white matter hypodensities within normal range for patient's age, though non-specific are most compatible with chronic small vessel ischemic disease. Multiple old bilateral basal ganglia infarcts. Acute large vascular territory infarcts. No abnormal extra-axial fluid collections. Basal cisterns are patent. VASCULAR: Mild calcific atherosclerosis of the carotid siphons. Anterior genu of the carotid siphons protrude into the posterior sphenoid sinuses with ample bony covering. SKULL: No skull fracture. No significant scalp soft tissue swelling. SINUSES/ORBITS: The mastoid air-cells and included paranasal sinuses are well-aerated.The included ocular globes and orbital contents are non-suspicious. OTHER: None. ASPECTS [facility] Stroke Program Early CT Score) - Ganglionic level infarction (caudate, lentiform nuclei, internal capsule, insula, M1-M3 cortex): 7 - Supraganglionic infarction (M4-M6 cortex): 3 Total score (0-10 with 10 being normal): 10 IMPRESSION: 1. No acute intracranial process. Stable examination including multiple old basal ganglia infarcts and moderate to severe chronic small vessel ischemic disease. 2. ASPECTS score is 10. 3. Critical Value/emergent results were called by telephone at the time of interpretation on 10/25/2016 at 10:36 pm to Dr. Elnora Morrison , who verbally acknowledged these results. Electronically Signed   By: Elon Alas M.D.   On: 10/25/2016 22:38    Assessment: 75 y.o. male with multiple risk factors for stroke as well as history of stroke and TIA presenting with probable acute right ACA territory ischemic infarction.  Stroke Risk Factors - diabetes mellitus, family history and hypertension  Plan: 1. HgbA1c, fasting lipid panel 2. MRI, MRA  of the brain without contrast 3. PT consult 4. Carotid dopplers 5. Prophylactic therapy-Antiplatelet med: Plavix  6. Risk factor modification 7.  Telemetry monitoring  This patient is critically ill and at significant risk of neurological worsening or death, and care requires constant monitoring of vital signs, hemodynamics,respiratory and cardiac monitoring, neurological assessment, discussion with family, other specialists and medical decision making of high complexity. Total critical care time was 60 minutes.  C.R. Nicole Kindred, MD [facility] [phone]  10/25/2016, 11:09 PM

## 2016-10-25 NOTE — ED Provider Notes (Signed)
[place] DEPT Provider Note   CSN: [id number] Arrival date & time: 10/25/16  2204   An emergency department physician performed an initial assessment on this suspected stroke patient at 2220.  History   Chief Complaint Chief Complaint  Patient presents with  . Code Stroke    HPI Justin [name] is a 75 y.o. male.  Patient with stroke history, diabetes, reflux presents with acute onset of left leg weakness and balance/coordination issues since approximately 8:30 this evening. Patient's had viewed onset stroke/TIA in the past.patient is on Plavix.symptoms constant.      Past Medical History:  Diagnosis Date  . Diabetes mellitus without complication ([place])   . GERD (gastroesophageal reflux disease)   . Gout   . Hypertension   . Paroxysmal SVT (supraventricular tachycardia) ([place])    a. s/p RFCA on 05/01/15  . Stroke [facility])     Patient Active Problem List   Diagnosis Date Noted  . History of recent stroke 06/06/2016  . 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 TIA (transient ischemic attack) 06/02/2016  . Numbness 06/01/2016  . Right arm numbness 06/01/2016  . CAD (coronary artery disease), native coronary artery 03/20/2016  . Hypertension   . GERD (gastroesophageal reflux disease)   . Gout   . S/P RF ablation operation for arrhythmia 12/13/2014  . Hyperlipidemia 12/13/2014  . Diabetes mellitus ([place]) 12/07/2014    Past Surgical History:  Procedure Laterality Date  . BASAL CELL CARCINOMA EXCISION     off of back  . ELECTROPHYSIOLOGIC STUDY N/A 05/01/2015   Procedure: SVT Ablation;  Surgeon: Evans Lance, MD;  Location: [place] CV LAB;  Service: Cardiovascular;  Laterality: N/A;  . EP IMPLANTABLE DEVICE N/A 06/04/2016   Procedure: Loop Recorder Insertion;  Surgeon: Thompson Grayer, MD;  Location: [place] CV LAB;  Service: Cardiovascular;  Laterality: N/A;  . SQUAMOUS CELL CARCINOMA EXCISION    . TEE WITHOUT CARDIOVERSION N/A 06/04/2016   Procedure:  TRANSESOPHAGEAL ECHOCARDIOGRAM (TEE);  Surgeon: Pixie Casino, MD;  Location: [facility] ENDOSCOPY;  Service: Cardiovascular;  Laterality: N/A;    OB History    No data available       Home Medications    Prior to Admission medications   Medication Sig Start Date End Date Taking? Authorizing Provider  allopurinol (ZYLOPRIM) 300 MG tablet Take 300 mg by mouth.    Historical Provider, MD  atorvastatin (LIPITOR) 40 MG tablet Take 1 tablet (40 mg total) by mouth every morning. 06/24/16   Pixie Casino, MD  clopidogrel (PLAVIX) 75 MG tablet Take 1 tablet (75 mg total) by mouth daily. 06/24/16   Pixie Casino, MD  fluorouracil (EFUDEX) 5 % cream Apply 1 application topically daily as needed (for skin cancer prevention).  12/28/14   Historical Provider, MD  ibuprofen (ADVIL,MOTRIN) 200 MG tablet Take 400 mg by mouth daily as needed for moderate pain.     Historical Provider, MD  metFORMIN (GLUCOPHAGE) 500 MG tablet Take 2 tablets (1,000 mg total) by mouth 2 (two) times daily with a meal. Patient taking differently: Take 1,000 mg by mouth 2 (two) times daily with a meal.  06/05/16   Florencia Reasons, MD  nitroGLYCERIN (NITROSTAT) 0.4 MG SL tablet Place 1 tablet (0.4 mg total) under the tongue every 5 (five) minutes as needed for chest pain. 01/31/15   Pixie Casino, MD  omeprazole (PRILOSEC) 20 MG capsule Take 20 mg by mouth every morning.     Historical Provider, MD  telmisartan-hydrochlorothiazide (  MICARDIS HCT) 80-12.5 MG tablet TAKE ONE-HALF (1/2) TABLET DAILY (DISCARD REMAINDER AFTER OPENING). 06/05/16   Florencia Reasons, MD  triamcinolone cream (KENALOG) 0.1 % APP EXT AA BID 07/29/16   Historical Provider, MD    Family History Family History  Problem Relation Age of Onset  . Stroke Mother   . Hypertension Mother   . Alzheimer's disease Father   . Heart failure Father   . Down syndrome Daughter     Social History Social History  Substance Use Topics  . Smoking status: Never Smoker  . Smokeless tobacco: Never  Used  . Alcohol use No     Allergies   Review of patient's allergies indicates no known allergies.   Review of Systems Review of Systems  Constitutional: Negative for chills and fever.  HENT: Negative for congestion.   Eyes: Negative for visual disturbance.  Respiratory: Negative for shortness of breath.   Cardiovascular: Negative for chest pain.  Gastrointestinal: Negative for abdominal pain and vomiting.  Genitourinary: Negative for dysuria and flank pain.  Musculoskeletal: Negative for back pain, neck pain and neck stiffness.  Skin: Negative for rash.  Neurological: Positive for weakness. Negative for light-headedness and headaches.     Physical Exam Updated Vital Signs BP 126/86   Pulse 80   Temp 97.5 F (36.4 C) (Oral)   Resp 19   Ht 5\' 9"  (1.753 m)   Wt 165 lb (74.8 kg)   SpO2 99%   BMI 24.37 kg/m   Physical Exam  Constitutional: He is oriented to person, place, and time. He appears well-developed and well-nourished.  HENT:  Head: Normocephalic and atraumatic.  Eyes: Conjunctivae are normal. Right eye exhibits no discharge. Left eye exhibits no discharge.  Neck: Normal range of motion. Neck supple. No tracheal deviation present.  Cardiovascular: Normal rate and regular rhythm.   Pulmonary/Chest: Effort normal and breath sounds normal.  Abdominal: Soft. He exhibits no distension. There is no tenderness. There is no guarding.  Musculoskeletal: He exhibits no edema.  Neurological: He is alert and oriented to person, place, and time.  Patient has weakness left leg versus right, left leg drift. Equal arm strength bilateral.  Extraocular muscle function intact, pupils equal.  Normal speech.  Skin: Skin is warm. No rash noted.  Psychiatric: He has a normal mood and affect.  Nursing note and vitals reviewed.    ED Treatments / Results  Labs (all labs ordered are listed, but only abnormal results are displayed) Labs Reviewed  I-STAT CHEM 8, ED - Abnormal;  Notable for the following:       Result Value   BUN 27 (*)    Glucose, Bld 153 (*)    Calcium, Ion 1.11 (*)    Hemoglobin 10.9 (*)    HCT 32.0 (*)    All other components within normal limits  CBG MONITORING, ED - Abnormal; Notable for the following:    Glucose-Capillary 120 (*)    All other components within normal limits  ETHANOL  PROTIME-INR  APTT  CBC  DIFFERENTIAL  COMPREHENSIVE METABOLIC PANEL  RAPID URINE DRUG SCREEN, HOSP PERFORMED  URINALYSIS, ROUTINE W REFLEX MICROSCOPIC (NOT AT [facility])  I-STAT TROPOININ, ED    EKG  EKG Interpretation None       Radiology Ct Head Code Stroke W/o Cm  Result Date: 10/25/2016 CLINICAL DATA:  Code stroke. LEFT-sided weakness, unsteady gait. Last seen normal at 2030 hours. History of hypertension and diabetes, stroke. EXAM: CT HEAD WITHOUT CONTRAST TECHNIQUE: Contiguous axial images  were obtained from the base of the skull through the vertex without intravenous contrast. COMPARISON:  MRI head June 01, 2016 9 CT HEAD June 01, 2016 FINDINGS: BRAIN: The ventricles and sulci are normal for age. No intraparenchymal hemorrhage, mass effect nor midline shift. Patchy supratentorial white matter hypodensities within normal range for patient's age, though non-specific are most compatible with chronic small vessel ischemic disease. Multiple old bilateral basal ganglia infarcts. Acute large vascular territory infarcts. No abnormal extra-axial fluid collections. Basal cisterns are patent. VASCULAR: Mild calcific atherosclerosis of the carotid siphons. Anterior genu of the carotid siphons protrude into the posterior sphenoid sinuses with ample bony covering. SKULL: No skull fracture. No significant scalp soft tissue swelling. SINUSES/ORBITS: The mastoid air-cells and included paranasal sinuses are well-aerated.The included ocular globes and orbital contents are non-suspicious. OTHER: None. ASPECTS [facility] Stroke Program Early CT Score) - Ganglionic level  infarction (caudate, lentiform nuclei, internal capsule, insula, M1-M3 cortex): 7 - Supraganglionic infarction (M4-M6 cortex): 3 Total score (0-10 with 10 being normal): 10 IMPRESSION: 1. No acute intracranial process. Stable examination including multiple old basal ganglia infarcts and moderate to severe chronic small vessel ischemic disease. 2. ASPECTS score is 10. 3. Critical Value/emergent results were called by telephone at the time of interpretation on 10/25/2016 at 10:36 pm to Dr. Elnora Morrison , who verbally acknowledged these results. Electronically Signed   By: Elon Alas M.D.   On: 10/25/2016 22:38    Procedures Procedures (including critical care time) CRITICAL CARE Performed by: Mariea Clonts   Total critical care time: 35 minutes  Critical care time was exclusive of separately billable procedures and treating other patients.  Critical care was necessary to treat or prevent imminent or life-threatening deterioration.  Critical care was time spent personally by me on the following activities: development of treatment plan with patient and/or surrogate as well as nursing, discussions with consultants, evaluation of patient's response to treatment, examination of patient, obtaining history from patient or surrogate, ordering and performing treatments and interventions, ordering and review of laboratory studies, ordering and review of radiographic studies, pulse oximetry and re-evaluation of patient's condition.  Medications Ordered in ED Medications  alteplase (ACTIVASE) 1 mg/mL infusion 67 mg (67 mg Intravenous New Bag/Given 10/25/16 2247)    Followed by  0.9 %  sodium chloride infusion (not administered)     Initial Impression / Assessment and Plan / ED Course  I have reviewed the triage vital signs and the nursing notes.  Pertinent labs & imaging results that were available during my care of the patient were reviewed by me and considered in my medical decision making  (see chart for details).  Clinical Course   Patient presents with acute onset left leg weakness. Patient has normal vascular exam in the left leg. Concern for acute stroke paged to neurology. Patient sent immediately to CT scan discuss with radiology no bleeding. Stroke neurologist or TPA. Patient being monitored closely.  The patients results and plan were reviewed and discussed.   Any x-rays performed were independently reviewed by myself.   Differential diagnosis were considered with the presenting HPI.  Medications  alteplase (ACTIVASE) 1 mg/mL infusion 67 mg (not administered)    Followed by  0.9 %  sodium chloride infusion (not administered)    Vitals:   10/25/16 2215 10/25/16 2216 10/25/16 2230  BP: 140/94 133/88 126/86  Pulse: 74 76 80  Resp: 16 13 19   Temp:  97.5 F (36.4 C)   TempSrc:  Oral  SpO2: 100% 100% 99%  Weight:  165 lb (74.8 kg)   Height:  5\' 9"  (1.753 m)     Final diagnoses:  Acute ischemic stroke [facility])   Admission/ observation were discussed with the admitting physician, patient and/or family and they are comfortable with the plan.    Final Clinical Impressions(s) / ED Diagnoses   Final diagnoses:  Acute ischemic stroke [facility])    New Prescriptions New Prescriptions   No medications on file     Elnora Morrison, MD 11/01/16 1115

## 2016-10-25 NOTE — ED Notes (Signed)
Pt presents from home with [facility] for LLE weakness and ataxia or incoordination; pt states LSN 2030; prior CVA hx with plavix use

## 2016-10-26 ENCOUNTER — Inpatient Hospital Stay (HOSPITAL_COMMUNITY): Payer: Medicare Other

## 2016-10-26 DIAGNOSIS — I6789 Other cerebrovascular disease: Secondary | ICD-10-CM | POA: Diagnosis not present

## 2016-10-26 DIAGNOSIS — I63421 Cerebral infarction due to embolism of right anterior cerebral artery: Secondary | ICD-10-CM | POA: Diagnosis present

## 2016-10-26 DIAGNOSIS — E119 Type 2 diabetes mellitus without complications: Secondary | ICD-10-CM | POA: Diagnosis present

## 2016-10-26 DIAGNOSIS — N281 Cyst of kidney, acquired: Secondary | ICD-10-CM | POA: Diagnosis not present

## 2016-10-26 DIAGNOSIS — I639 Cerebral infarction, unspecified: Secondary | ICD-10-CM | POA: Diagnosis not present

## 2016-10-26 DIAGNOSIS — Q211 Atrial septal defect: Secondary | ICD-10-CM | POA: Diagnosis not present

## 2016-10-26 DIAGNOSIS — I63233 Cerebral infarction due to unspecified occlusion or stenosis of bilateral carotid arteries: Secondary | ICD-10-CM | POA: Diagnosis not present

## 2016-10-26 DIAGNOSIS — Z7902 Long term (current) use of antithrombotics/antiplatelets: Secondary | ICD-10-CM | POA: Diagnosis not present

## 2016-10-26 DIAGNOSIS — I10 Essential (primary) hypertension: Secondary | ICD-10-CM | POA: Diagnosis not present

## 2016-10-26 DIAGNOSIS — Z85828 Personal history of other malignant neoplasm of skin: Secondary | ICD-10-CM | POA: Diagnosis not present

## 2016-10-26 DIAGNOSIS — R269 Unspecified abnormalities of gait and mobility: Secondary | ICD-10-CM | POA: Diagnosis not present

## 2016-10-26 DIAGNOSIS — D62 Acute posthemorrhagic anemia: Secondary | ICD-10-CM | POA: Diagnosis present

## 2016-10-26 DIAGNOSIS — K219 Gastro-esophageal reflux disease without esophagitis: Secondary | ICD-10-CM | POA: Diagnosis present

## 2016-10-26 DIAGNOSIS — Z95818 Presence of other cardiac implants and grafts: Secondary | ICD-10-CM | POA: Diagnosis not present

## 2016-10-26 DIAGNOSIS — Z8673 Personal history of transient ischemic attack (TIA), and cerebral infarction without residual deficits: Secondary | ICD-10-CM | POA: Diagnosis not present

## 2016-10-26 DIAGNOSIS — Z823 Family history of stroke: Secondary | ICD-10-CM | POA: Diagnosis not present

## 2016-10-26 DIAGNOSIS — I63412 Cerebral infarction due to embolism of left middle cerebral artery: Secondary | ICD-10-CM | POA: Diagnosis not present

## 2016-10-26 DIAGNOSIS — E785 Hyperlipidemia, unspecified: Secondary | ICD-10-CM | POA: Diagnosis present

## 2016-10-26 DIAGNOSIS — M109 Gout, unspecified: Secondary | ICD-10-CM | POA: Diagnosis present

## 2016-10-26 DIAGNOSIS — Z833 Family history of diabetes mellitus: Secondary | ICD-10-CM | POA: Diagnosis not present

## 2016-10-26 DIAGNOSIS — I63411 Cerebral infarction due to embolism of right middle cerebral artery: Secondary | ICD-10-CM | POA: Diagnosis present

## 2016-10-26 DIAGNOSIS — R079 Chest pain, unspecified: Secondary | ICD-10-CM | POA: Diagnosis not present

## 2016-10-26 DIAGNOSIS — M5126 Other intervertebral disc displacement, lumbar region: Secondary | ICD-10-CM | POA: Diagnosis not present

## 2016-10-26 DIAGNOSIS — R918 Other nonspecific abnormal finding of lung field: Secondary | ICD-10-CM | POA: Diagnosis not present

## 2016-10-26 DIAGNOSIS — Z79899 Other long term (current) drug therapy: Secondary | ICD-10-CM | POA: Diagnosis not present

## 2016-10-26 DIAGNOSIS — G8194 Hemiplegia, unspecified affecting left nondominant side: Secondary | ICD-10-CM | POA: Diagnosis present

## 2016-10-26 DIAGNOSIS — R531 Weakness: Secondary | ICD-10-CM | POA: Diagnosis present

## 2016-10-26 LAB — LIPID PANEL
CHOLESTEROL: 100 mg/dL (ref 0–200)
HDL: 30 mg/dL — ABNORMAL LOW (ref >40)
LDL Cholesterol: 55 mg/dL (ref 0–99)
Total CHOL/HDL Ratio: 3.3 RATIO
Triglycerides: 74 mg/dL (ref <150)
VLDL: 15 mg/dL (ref 0–40)

## 2016-10-26 LAB — MRSA PCR SCREENING: MRSA by PCR: NEGATIVE

## 2016-10-26 LAB — ETHANOL

## 2016-10-26 MED ORDER — ACETAMINOPHEN 325 MG PO TABS: 650.0000 mg | ORAL_TABLET | ORAL | Status: DC | PRN

## 2016-10-26 MED ORDER — LABETALOL HCL 5 MG/ML IV SOLN: 20.0000 mg | Freq: Once | INTRAVENOUS | Status: DC

## 2016-10-26 MED ORDER — STROKE: EARLY STAGES OF RECOVERY BOOK
Freq: Once | Status: AC
  Administered 2016-10-26: 1
  Filled 2016-10-26: qty 1

## 2016-10-26 MED ORDER — CLEVIDIPINE BUTYRATE 0.5 MG/ML IV EMUL: 0.0000 mg/h | INTRAVENOUS | Status: DC

## 2016-10-26 MED ORDER — PANTOPRAZOLE SODIUM 40 MG PO TBEC
40.0000 mg | DELAYED_RELEASE_TABLET | Freq: Every day | ORAL | Status: DC
  Administered 2016-10-26 – 2016-10-30 (×5): 40 mg via ORAL
  Filled 2016-10-26 (×5): qty 1

## 2016-10-26 MED ORDER — FAMOTIDINE IN NACL 20-0.9 MG/50ML-% IV SOLN: 20.0000 mg | Freq: Two times a day (BID) | INTRAVENOUS | Status: DC

## 2016-10-26 MED ORDER — ACETAMINOPHEN 650 MG RE SUPP: 650.0000 mg | RECTAL | Status: DC | PRN

## 2016-10-26 MED ORDER — SODIUM CHLORIDE 0.9 % IV SOLN
INTRAVENOUS | Status: DC
  Administered 2016-10-26: 75 mL/h via INTRAVENOUS

## 2016-10-26 NOTE — Progress Notes (Signed)
STROKE TEAM PROGRESS NOTE   HISTORY OF PRESENT ILLNESS (per record) Justin [name] is an 75 y.o. male history of stroke in May 2017, TIA in June 2017, PFO, diabetes mellitus and hypertension, presenting with new onset loss of control of his left lower extremity with weakness and numbness. Onset was at 8:30 PM tonight. He's been taking Plavix 75 mg per day. CT scan of his head showed old bilateral basal ganglia infarctions, but no acute findings. NIH stroke score was 3. Patient was deemed a candidate for TPA because of the significant disability, with inability to bear weight weight and control movement of his left lower extremity acutely.  LSN: 8:30 PM on 10/25/2016 tPA Given: Yes 10/25/16 2245 mRankin:   SUBJECTIVE (INTERVAL HISTORY) His family was not at the bedside.  Overall he feels his condition is improved.  24 hour CT scan due at 10pm  OBJECTIVE Temp:  [97.5 F (36.4 C)-97.8 F (36.6 C)] 97.5 F (36.4 C) (11/04 0400) Pulse Rate:  [65-89] 67 (11/04 0700) Resp:  [12-30] 15 (11/04 0700) BP: (111-140)/(68-94) 111/70 (11/04 0700) SpO2:  [94 %-100 %] 97 % (11/04 0700) Weight:  [74.8 kg (165 lb)] 74.8 kg (165 lb) (11/03 2216)  CBC:  Recent Labs Lab 10/25/16 2211 10/25/16 2224  WBC 2.9*  --   NEUTROABS 1.5*  --   HGB 10.4* 10.9*  HCT 31.0* 32.0*  MCV 87.6  --   PLT 269  --     Basic Metabolic Panel:  Recent Labs Lab 10/25/16 2211 10/25/16 2224  NA 136 140  K 3.9 3.9  CL 104 102  CO2 21*  --   GLUCOSE 151* 153*  BUN 25* 27*  CREATININE 1.01 1.00  CALCIUM 9.2  --     Lipid Panel:    Component Value Date/Time   CHOL 100 10/26/2016 0423   TRIG 74 10/26/2016 0423   HDL 30 (L) 10/26/2016 0423   CHOLHDL 3.3 10/26/2016 0423   VLDL 15 10/26/2016 0423   LDLCALC 55 10/26/2016 0423   HgbA1c:  Lab Results  Component Value Date   HGBA1C 7.5 (H) 06/02/2016   Urine Drug Screen:    Component Value Date/Time   LABOPIA NONE DETECTED 10/25/2016 2255   COCAINSCRNUR NONE  DETECTED 10/25/2016 2255   LABBENZ NONE DETECTED 10/25/2016 2255   AMPHETMU NONE DETECTED 10/25/2016 2255   THCU NONE DETECTED 10/25/2016 2255   LABBARB NONE DETECTED 10/25/2016 2255      IMAGING  Ct Head Code Stroke W/o Cm 10/25/2016 1. No acute intracranial process. Stable examination including multiple old basal ganglia infarcts and moderate to severe chronic small vessel ischemic disease.  2. ASPECTS score is 10.    MRI / MRA - pending   CT Head Repeat - pending  Physical Exam General - Well nourished, well developed, in NAD   Cardiovascular - Regular rate and rhythm Pulmonary: CTA Abdomen: NT, ND, normal bowel sounds Extremities: No C/C/E  Neurological Exam Mental Status: Normal Orientation:  Oriented to person, place and time Speech:  Fluent; no dysarthria  Cranial Nerves:  PERRL; EOMI; visual fields full, face grossly symmetric, hearing grossly intact; shrug symmetric and tongue midline  Motor Exam:  Tone:  Within normal limits; Strength: 5/5 throughout  Sensory: Intact to light touch throughout  Coordination:  Intact finger to nose  Gait: Deferred    ASSESSMENT/PLAN [name] is a 75 y.o. male with history of previous strokes, PFO, paroxysmal supraventricular tachycardia, hypertension, diabetes mellitus, and gout presenting with  left lower extremity weakness and numbness.  He did receive IV t-PA - tPA Given: 10/25/16 2245  Stroke:  Non-dominant infarct - embolic with an unknown source.  Resultant  Left side weakness with imrpvement  MRI  Pending ? With loop monitor  MRA  Pending? With loop monitor  Carotid Doppler pending  2D Echo pending  LDL 55  HgbA1c pending  VTE prophylaxis - SCDs  Diet regular Room service appropriate? Yes; Fluid consistency: Thin  clopidogrel 75 mg daily prior to admission, now on No antithrombotic secondary to TPA therapy.  Ongoing aggressive stroke risk factor management  Therapy recommendations:  pending  Disposition: Pending  Hypertension  Stable  Permissive hypertension (OK if < 220/120) but gradually normalize in 5-7 days  Long-term BP goal normotensive  Hyperlipidemia  Home meds: Lipitor 40 mg daily - not - resumed in hospital  LDL 55, goal < 70  Add - resume Lipitor when POs  Continue statin at discharge  Diabetes  HgbA1c pending, goal < 7.0  Uncontrolled  Other Stroke Risk Factors  Advanced age  Hx stroke/TIA  Family hx stroke (mother)  PFO  Other Active Problems  Anemia - 10.9 / 32  Leukopenia - 2.9  Elevated BUN - 27  CRITICAL CARE NEUROLOGY ATTENDING NOTE Patient was seen and examined by me personally. I independently viewed imaging studies, participated in medical decision making and plan of care. The laboratory and radiographic studies were personally reviewed by me.  ROS completed by me personally and pertinent positives fully documented.  He was without complaint Assessment and plan completed by me personally and fully documented above.  PLAN  Repeat labs in a.m.  Await follow-up head CT to resume antiplatelet therapy  Check lower extremity Dopplers secondary to PFO  Contact team for LOOP assessment  Condition is improved  This patient is critically ill and at significant risk of neurological worsening, death and care requires constant monitoring of vital signs, hemodynamics,respiratory and cardiac monitoring, extensive review of multiple databases, frequent neurological assessment, discussion with family, other specialists and medical decision making of high complexity.  This critical care time does not reflect procedure time, or teaching time or supervisory time of PA/NP/Med Resident etc. but could involve care discussion time.  I spent 30 minutes of Neurocritical Care time in the care of  this patient.  SIGNED BY: Dr. Laddie Aquas day # 0    To contact Stroke Continuity provider, please refer to  [email or web address] After hours, contact General Neurology

## 2016-10-26 NOTE — Evaluation (Signed)
Physical Therapy Evaluation Patient Details Name: Justin [name] MRN: [id number] DOB: 02-26-1941 Today's Date: 10/26/2016   History of Present Illness  Justin [name] is an 75 y.o. male history of stroke in May 2017, TIA in June 2017, PFO, diabetes mellitus and hypertension, presenting with new onset loss of control of his left lower extremity with weakness and numbness.  CT scan of his head showed old bilateral basal ganglia infarctions, but no acute findings.  Patient was deemed a candidate for TPA because of the significant disability, with inability to bear weight weight and control movement of his left lower extremity acutely.  Clinical Impression  Patient presents with decreased independence with mobility due to deficits listed in PT problem list.  He will benefit from skilled PT in the acute setting to allow d/c to CIR rehab prior to d/c home.  Continues with L LE weakness and quickly fatigues with high fall risk.  Feel with CIR level rehab could achieve Mod I for d/c home with intermittent help.     Follow Up Recommendations CIR    Equipment Recommendations  Rolling walker with 5" wheels    Recommendations for Other Services Rehab consult     Precautions / Restrictions Precautions Precautions: Fall      Mobility  Bed Mobility               General bed mobility comments: up in chair  Transfers Overall transfer level: Needs assistance Equipment used: Rolling walker (2 wheeled) Transfers: Sit to/from Stand Sit to Stand: Min assist         General transfer comment: steadying help, cues for hand placement  Ambulation/Gait Ambulation/Gait assistance: Min assist;Mod assist Ambulation Distance (Feet): 200 Feet Assistive device: Rolling walker (2 wheeled) Gait Pattern/deviations: Step-through pattern;Decreased step length - right;Decreased stance time - left;Drifts right/left Gait velocity: very slow, focused on knee control throughout   General Gait Details: assist  for safety, L knee buckling throughout, but able to manage with assist and walker for support; noted increased dragging L leg with fatigue during ambulation,  cues for placement to avoid tripping/falling as turning and backing up to chair with increased assist needed  Stairs            Wheelchair Mobility    Modified Rankin (Stroke Patients Only) Modified Rankin (Stroke Patients Only) Pre-Morbid Rankin Score: No symptoms Modified Rankin: Moderately severe disability     Balance Overall balance assessment: Needs assistance   Sitting balance-Leahy Scale: Fair     Standing balance support: Bilateral upper extremity supported Standing balance-Leahy Scale: Poor Standing balance comment: UE support for balance                             Pertinent Vitals/Pain Pain Assessment: No/denies pain    Home Living Family/patient expects to be discharged to:: Private residence Living Arrangements: Spouse/significant other Available Help at Discharge: Family;Available PRN/intermittently Type of Home: House Home Access: Stairs to enter Entrance Stairs-Rails: Chemical engineer of Steps: 4-5 Home Layout: Two level;Bed/bath upstairs Home Equipment: Walker - 4 wheels Additional Comments: has downstairs bedroom and bath with tub shower with grabbars    Prior Function Level of Independence: Independent               Hand Dominance   Dominant Hand: Right    Extremity/Trunk Assessment   Upper Extremity Assessment: LUE deficits/detail       LUE Deficits / Details: decreased grip compared to R,  noted some edema in hand, relates pain with TPA infusion, elbow flexion 4-/5, extension 3+/5, shoulder flexion 4/5   Lower Extremity Assessment: LLE deficits/detail   LLE Deficits / Details: hip flexion 4-/5, knee extension 4/5, ankle DF 4+/5     Communication   Communication: No difficulties  Cognition Arousal/Alertness: Awake/alert Behavior During  Therapy: WFL for tasks assessed/performed Overall Cognitive Status: Within Functional Limits for tasks assessed                      General Comments      Exercises     Assessment/Plan    PT Assessment Patient needs continued PT services  PT Problem List Decreased strength;Decreased mobility;Decreased balance;Decreased knowledge of use of DME;Decreased safety awareness;Decreased activity tolerance          PT Treatment Interventions DME instruction;Gait training;Stair training;Balance training;Functional mobility training;Therapeutic exercise;Patient/family education;Therapeutic activities    PT Goals (Current goals can be found in the Care Plan section)  Acute Rehab PT Goals Patient Stated Goal: To return to independent PT Goal Formulation: With patient Time For Goal Achievement: 11/02/16 Potential to Achieve Goals: Good    Frequency Min 4X/week   Barriers to discharge Decreased caregiver support wife works    Co-evaluation               End of Session Equipment Utilized During Treatment: Gait belt Activity Tolerance: Patient limited by fatigue Patient left: in chair;with call bell/phone within reach           Time: [id number] PT Time Calculation (min) (ACUTE ONLY): 28 min   Charges:   PT Evaluation $PT Eval Moderate Complexity: 1 Procedure PT Treatments $Gait Training: 8-22 mins   PT G CodesReginia Naas 2016-11-06, 4:01 PM  Magda Kiel, [place] 11/06/16

## 2016-10-26 NOTE — Plan of Care (Signed)
Problem: Education: Goal: Knowledge of disease or condition will improve Outcome: Completed/Met Date Met: 10/26/16 Pt expressed that he has had a stroke and TIA in the past and is familiar with the education information being provided. Pt states that he sees his PCP regularly and takes appropriate actions to take care of his health.    

## 2016-10-27 ENCOUNTER — Inpatient Hospital Stay (HOSPITAL_COMMUNITY): Payer: Medicare Other

## 2016-10-27 DIAGNOSIS — I639 Cerebral infarction, unspecified: Secondary | ICD-10-CM

## 2016-10-27 LAB — CBC WITH DIFFERENTIAL/PLATELET
BASOS PCT: 0 %
Basophils Absolute: 0 10*3/uL (ref 0.0–0.1)
EOS ABS: 0.1 10*3/uL (ref 0.0–0.7)
EOS PCT: 2 %
HCT: 32 % — ABNORMAL LOW (ref 39.0–52.0)
HEMOGLOBIN: 10.4 g/dL — AB (ref 13.0–17.0)
Lymphocytes Relative: 16 %
Lymphs Abs: 1 10*3/uL (ref 0.7–4.0)
MCH: 28.4 pg (ref 26.0–34.0)
MCHC: 32.5 g/dL (ref 30.0–36.0)
MCV: 87.4 fL (ref 78.0–100.0)
MONOS PCT: 6 %
Monocytes Absolute: 0.4 10*3/uL (ref 0.1–1.0)
NEUTROS PCT: 76 %
Neutro Abs: 5 10*3/uL (ref 1.7–7.7)
PLATELETS: 220 10*3/uL (ref 150–400)
RBC: 3.66 MIL/uL — ABNORMAL LOW (ref 4.22–5.81)
RDW: 16.8 % — ABNORMAL HIGH (ref 11.5–15.5)
WBC: 6.5 10*3/uL (ref 4.0–10.5)

## 2016-10-27 LAB — HEMOGLOBIN A1C
Hgb A1c MFr Bld: 6.7 % — ABNORMAL HIGH (ref 4.8–5.6)
Mean Plasma Glucose: 146 mg/dL

## 2016-10-27 LAB — BASIC METABOLIC PANEL
Anion gap: 8 (ref 5–15)
BUN: 25 mg/dL — AB (ref 6–20)
CALCIUM: 8.6 mg/dL — AB (ref 8.9–10.3)
CO2: 24 mmol/L (ref 22–32)
CREATININE: 1 mg/dL (ref 0.61–1.24)
Chloride: 105 mmol/L (ref 101–111)
GFR calc non Af Amer: >60 mL/min (ref >60)
Glucose, Bld: 127 mg/dL — ABNORMAL HIGH (ref 65–99)
Potassium: 4.2 mmol/L (ref 3.5–5.1)
SODIUM: 137 mmol/L (ref 135–145)

## 2016-10-27 MED ORDER — ASPIRIN 325 MG PO TABS
325.0000 mg | ORAL_TABLET | Freq: Every day | ORAL | Status: DC
  Administered 2016-10-27 – 2016-10-30 (×4): 325 mg via ORAL
  Filled 2016-10-27 (×4): qty 1

## 2016-10-27 NOTE — Progress Notes (Signed)
STROKE TEAM PROGRESS NOTE   HISTORY OF PRESENT ILLNESS (per record) Justin [name] is an 75 y.o. male history of stroke in May 2017, TIA in June 2017, PFO, diabetes mellitus and hypertension, presenting with new onset loss of control of his left lower extremity with weakness and numbness. Onset was at 8:30 PM tonight. He's been taking Plavix 75 mg per day. CT scan of his head showed old bilateral basal ganglia infarctions, but no acute findings. NIH stroke score was 3. Patient was deemed a candidate for TPA because of the significant disability, with inability to bear weight weight and control movement of his left lower extremity acutely.  LSN: 8:30 PM on 10/25/2016 tPA Given: Yes 10/25/16 2245 mRankin:   SUBJECTIVE (INTERVAL HISTORY) His family was not at the bedside.  Overall he feels his condition is improved.  24 hour MRI completed and there no hemorrhagic changes.  He continues the have weakness in his left lower extremity as well as what he describes as "clumbsiness."  He denies pain other than a dull ache in his back which he has not mentioned previously but now reports that he has had some chronic lower back discomfort prior to admission.  In fact, he also recapped the day of admission and now reports that he had PT for his back in the AM on the day of admission.  He states after PT he had an active day until he had sudden onset of left leg weakness in the evening.  Apparently patient has participated in PT for his back for several weeks now.  He can not recall a recent radiographic study of his back.  OBJECTIVE Temp:  [97.6 F (36.4 C)-98.6 F (37 C)] 97.7 F (36.5 C) (11/05 0400) Pulse Rate:  [59-87] 69 (11/05 0600) Cardiac Rhythm: Normal sinus rhythm (11/04 2000) Resp:  [6-24] 14 (11/05 0600) BP: (97-133)/(57-85) 108/62 (11/05 0600) SpO2:  [93 %-100 %] 93 % (11/05 0600)  CBC:   Recent Labs Lab 10/25/16 2211 10/25/16 2224 10/27/16 0410  WBC 2.9*  --  6.5  NEUTROABS 1.5*  --   5.0  HGB 10.4* 10.9* 10.4*  HCT 31.0* 32.0* 32.0*  MCV 87.6  --  87.4  PLT 269  --  [id number]    Basic Metabolic Panel:   Recent Labs Lab 10/25/16 2211 10/25/16 2224 10/27/16 0410  NA 136 140 137  K 3.9 3.9 4.2  CL 104 102 105  CO2 21*  --  24  GLUCOSE 151* 153* 127*  BUN 25* 27* 25*  CREATININE 1.01 1.00 1.00  CALCIUM 9.2  --  8.6*    Lipid Panel:     Component Value Date/Time   CHOL 100 10/26/2016 0423   TRIG 74 10/26/2016 0423   HDL 30 (L) 10/26/2016 0423   CHOLHDL 3.3 10/26/2016 0423   VLDL 15 10/26/2016 0423   LDLCALC 55 10/26/2016 0423   HgbA1c:  Lab Results  Component Value Date   HGBA1C 7.5 (H) 06/02/2016   Urine Drug Screen:     Component Value Date/Time   LABOPIA NONE DETECTED 10/25/2016 2255   COCAINSCRNUR NONE DETECTED 10/25/2016 2255   LABBENZ NONE DETECTED 10/25/2016 2255   AMPHETMU NONE DETECTED 10/25/2016 2255   THCU NONE DETECTED 10/25/2016 2255   LABBARB NONE DETECTED 10/25/2016 2255      IMAGING  Ct Head Code Stroke W/o Cm 10/25/2016 1. No acute intracranial process. Stable examination including multiple old basal ganglia infarcts and moderate to severe chronic small vessel ischemic disease.  2. ASPECTS score is 10.   MRI / MRA 11/4 -IMPRESSION: 1. Patchy small volume acute ischemic infarcts involving the posterior right centrum semi ovale and parasagittal right parietal cortex. No associated hemorrhage or mass effect. The 2. No other acute intracranial process identified. 3. Moderate chronic microvascular ischemic disease. IMPRESSION: Normal MRA of the circle of Willis and intracranial arteries.   Physical Exam General - Well nourished, well developed, in NAD   Cardiovascular - Regular rate and rhythm Pulmonary: CTA Abdomen: NT, ND, normal bowel sounds Extremities: No C/C/E  Neurological Exam Mental Status: Normal Orientation:  Oriented to person, place and time Speech:  Fluent; no dysarthria  Cranial Nerves:  PERRL;  EOMI; visual fields full, face grossly symmetric, hearing grossly intact; shrug symmetric and tongue midline  Motor Exam:  Tone:  Within normal limits; Strength: 5/5 throughout.  However, left lower extremity exhibits drift   Sensory: Intact to light touch throughout  Coordination:  Intact finger to nose; heal to shin slightly ataxic on the left  Gait: Deferred  ASSESSMENT/PLAN [name] is a 75 y.o. male with history of previous strokes, PFO, paroxysmal supraventricular tachycardia, hypertension, diabetes mellitus, and gout presenting with left lower extremity weakness and numbness.  He did receive IV t-PA - tPA Given: 10/25/16 2245  Stroke:  Non-dominant infarct - embolic   Resultant  Left side weakness with improvement but residual drift and some ataxia  MRI  Reviewed and results above  MRA  Reviewed and results above  Carotid Doppler pending  2D Echo pending  LDL 55  HgbA1c pending  VTE prophylaxis - SCDs Diet regular Room service appropriate? Yes; Fluid consistency: Thin  clopidogrel 75 mg daily prior to admission, now on No antithrombotic secondary to TPA therapy.  Ongoing aggressive stroke risk factor management  Therapy recommendations: pending  Disposition: Pending  Hypertension  Stable  Permissive hypertension (OK if < 220/120) but gradually normalize in 5-7 days  Long-term BP goal normotensive  Hyperlipidemia  Home meds: Lipitor 40 mg daily - not - resumed in hospital  LDL 55, goal < 70  Add - resume Lipitor when POs  Continue statin at discharge  Diabetes  HgbA1c pending, goal < 7.0  Uncontrolled  Other Stroke Risk Factors  Advanced age  Hx stroke/TIA  Family hx stroke (mother)  PFO  Other Active Problems  Anemia - 10.9 / 32  Leukopenia - 2.9  Elevated BUN - 27  CRITICAL CARE NEUROLOGY ATTENDING NOTE Patient was seen and examined by me personally. I independently viewed imaging studies, participated in medical  decision making and plan of care. The laboratory and radiographic studies were personally reviewed by me.  ROS completed by me personally and pertinent positives fully documented.  He was without complaint other than documented above Assessment and plan completed by me personally and fully documented above.  PLAN  Lumbar spine MRI given symptoms seem disproportionate to findings.  This was discussed with patient and he agreed to also pursue L-Spine films.  We will continue secondary preventions given posotive diffusion study findings  ASA now that MRI of head without hemorrhage  Contact team for LOOP assessment prior to L-Spine MRI  A1c pending  Condition is improved.  May transfer to floor  SIGNED BY: Dr. Laddie Aquas day # 1  To contact Stroke Continuity provider, please refer to [email or web address] After hours, contact General Neurology

## 2016-10-27 NOTE — Progress Notes (Signed)
Inpatient Rehabilitation  PT is recommending IP Rehab.  It appears that MRI is positive for patchy acute infarcts.  At this time, we are recommending an IP rehab consult.  Please order if you are agreeable.    [place] Admissions Coordinator Cell [phone] Office [phone]

## 2016-10-27 NOTE — Progress Notes (Signed)
Pt transferred from 72M; pt  was orientated to the unit. Bed alarm activated. Will continue to monitor.  Arta Silence, RN  10/27/2016

## 2016-10-27 NOTE — Progress Notes (Signed)
*  PRELIMINARY RESULTS* Vascular Ultrasound Carotid Duplex (Doppler) has been completed.   Findings suggest 1-39% internal carotid artery stenosis bilaterally. Vertebral arteries are patent with antegrade flow.   Bilateral lower extremity venous duplex completed. Bilateral lower extremities are negative for deep vein thrombosis. There is no evidence of Baker's cyst bilaterally.  10/27/2016 2:25 PM Maudry Mayhew, BS, RVT, RDCS, RDMS

## 2016-10-28 ENCOUNTER — Inpatient Hospital Stay (HOSPITAL_COMMUNITY): Payer: Medicare Other

## 2016-10-28 ENCOUNTER — Encounter (HOSPITAL_COMMUNITY): Payer: Self-pay | Admitting: Radiology

## 2016-10-28 DIAGNOSIS — I639 Cerebral infarction, unspecified: Secondary | ICD-10-CM | POA: Diagnosis present

## 2016-10-28 DIAGNOSIS — Z8673 Personal history of transient ischemic attack (TIA), and cerebral infarction without residual deficits: Secondary | ICD-10-CM

## 2016-10-28 DIAGNOSIS — R269 Unspecified abnormalities of gait and mobility: Secondary | ICD-10-CM

## 2016-10-28 DIAGNOSIS — I10 Essential (primary) hypertension: Secondary | ICD-10-CM

## 2016-10-28 DIAGNOSIS — I63412 Cerebral infarction due to embolism of left middle cerebral artery: Secondary | ICD-10-CM

## 2016-10-28 DIAGNOSIS — I471 Supraventricular tachycardia: Secondary | ICD-10-CM

## 2016-10-28 DIAGNOSIS — D62 Acute posthemorrhagic anemia: Secondary | ICD-10-CM

## 2016-10-28 DIAGNOSIS — I6789 Other cerebrovascular disease: Secondary | ICD-10-CM

## 2016-10-28 DIAGNOSIS — E1159 Type 2 diabetes mellitus with other circulatory complications: Secondary | ICD-10-CM

## 2016-10-28 LAB — GLUCOSE, CAPILLARY
GLUCOSE-CAPILLARY: 183 mg/dL — AB (ref 65–99)
GLUCOSE-CAPILLARY: 98 mg/dL (ref 65–99)
Glucose-Capillary: 120 mg/dL — ABNORMAL HIGH (ref 65–99)
Glucose-Capillary: 149 mg/dL — ABNORMAL HIGH (ref 65–99)

## 2016-10-28 LAB — ECHOCARDIOGRAM COMPLETE
Height: 69 in
Weight: 2640 oz

## 2016-10-28 MED ORDER — GADOBENATE DIMEGLUMINE 529 MG/ML IV SOLN: 15.0000 mL | Freq: Once | INTRAVENOUS | Status: DC

## 2016-10-28 MED ORDER — IOPAMIDOL (ISOVUE-300) INJECTION 61%
100.0000 mL | Freq: Once | INTRAVENOUS | Status: AC | PRN
  Administered 2016-10-28: 100 mL via INTRAVENOUS

## 2016-10-28 MED ORDER — IOPAMIDOL (ISOVUE-300) INJECTION 61%
15.0000 mL | INTRAVENOUS | Status: AC
  Administered 2016-10-28 (×2): 15 mL via ORAL

## 2016-10-28 MED ORDER — ATORVASTATIN CALCIUM 40 MG PO TABS
40.0000 mg | ORAL_TABLET | Freq: Every day | ORAL | Status: DC
  Administered 2016-10-28 – 2016-10-29 (×2): 40 mg via ORAL
  Filled 2016-10-28 (×2): qty 1

## 2016-10-28 MED ORDER — IOPAMIDOL (ISOVUE-300) INJECTION 61%
INTRAVENOUS | Status: AC
  Filled 2016-10-28: qty 100

## 2016-10-28 MED ORDER — CLOPIDOGREL BISULFATE 75 MG PO TABS
75.0000 mg | ORAL_TABLET | Freq: Every day | ORAL | Status: DC
  Administered 2016-10-28 – 2016-10-30 (×3): 75 mg via ORAL
  Filled 2016-10-28 (×3): qty 1

## 2016-10-28 MED ORDER — INSULIN ASPART 100 UNIT/ML ~~LOC~~ SOLN
0.0000 [IU] | Freq: Three times a day (TID) | SUBCUTANEOUS | Status: DC
  Administered 2016-10-29: 3 [IU] via SUBCUTANEOUS

## 2016-10-28 MED ORDER — ALLOPURINOL 100 MG PO TABS
300.0000 mg | ORAL_TABLET | Freq: Every day | ORAL | Status: DC
  Administered 2016-10-28 – 2016-10-30 (×3): 300 mg via ORAL
  Filled 2016-10-28 (×3): qty 3

## 2016-10-28 NOTE — Progress Notes (Signed)
Occupational Therapy Evaluation Patient Details Name: Justin [name] MRN: [id number] DOB: 05-20-1941 Today's Date: 10/28/2016    History of Present Illness Justin [name] is an 75 y.o. male history of stroke in May 2017, TIA in June 2017, PFO, diabetes mellitus and hypertension, presenting with new onset loss of control of his left lower extremity with weakness and numbness.  CT scan of his head showed old bilateral basal ganglia infarctions, but no acute findings.  Patient was deemed a candidate for TPA because of the significant disability, with inability to bear weight weight and control movement of his left lower extremity acutely.   Clinical Impression   PTA, pt independent with all ADL and mobility, including driving and was very active. Pt demonstrates a decline in his functional ability and requires min A for ADL and mod A for mobility. Feel pt would benefit from intensive rehab at CIR to facilitate safe D/C home and return to PLOF. Will follow acutely to address goals. Educated pt on signs/symtoms of CVA.    Follow Up Recommendations  Supervision/Assistance - 24 hour;CIR    Equipment Recommendations  Tub/shower bench    Recommendations for Other Services Rehab consult     Precautions / Restrictions Precautions Precautions: Fall Precaution Comments: L sided weakness Restrictions Weight Bearing Restrictions: No      Mobility Bed Mobility               General bed mobility comments: up in chair  Transfers Overall transfer level: Needs assistance Equipment used: Rolling walker (2 wheeled) Transfers: Sit to/from Stand Sit to Stand: Min guard         General transfer comment: v/'cs for safe hand placement, increased time    Balance Overall balance assessment: Needs assistance Sitting-balance support: No upper extremity supported;Feet supported Sitting balance-Leahy Scale: Fair     Standing balance support: Bilateral upper extremity supported Standing  balance-Leahy Scale: Poor Standing balance comment: UE support                            ADL Overall ADL's : Needs assistance/impaired Eating/Feeding: Independent   Grooming: Min guard;Cueing for safety   Upper Body Bathing: Set up;Sitting   Lower Body Bathing: Minimal assistance;Sit to/from stand   Upper Body Dressing : Set up;Sitting   Lower Body Dressing: Minimal assistance   Toilet Transfer: Minimal assistance;Ambulation   Toileting- Clothing Manipulation and Hygiene: Min guard       Functional mobility during ADLs: Moderate assistance (due to LOB when stepping toward L and backwards)       Vision Vision Assessment?: No apparent visual deficits (will further assess)   Perception Perception Perception Tested?: Yes Comments: appears intact. wuill further assess   Praxis Praxis Praxis tested?: Within functional limits    Pertinent Vitals/Pain Pain Assessment: No/denies pain     Hand Dominance Right   Extremity/Trunk Assessment Upper Extremity Assessment Upper Extremity Assessment: LUE deficits/detail LUE Deficits / Details: WEaker than RUE but functional. Overall strength @ 4/5 throughout. Mild coordiantion deficits LUE Sensation:  (appears intact) LUE Coordination: decreased fine motor   Lower Extremity Assessment Lower Extremity Assessment: Defer to PT evaluation   Cervical / Trunk Assessment Cervical / Trunk Assessment: Normal   Communication Communication Communication: No difficulties   Cognition Arousal/Alertness: Awake/alert Behavior During Therapy: WFL for tasks assessed/performed Overall Cognitive Status: Within Functional Limits for tasks assessed  General Comments       Exercises       Shoulder Instructions      Home Living Family/patient expects to be discharged to:: Private residence Living Arrangements: Spouse/significant other Available Help at Discharge: Family;Available  PRN/intermittently Type of Home: House Home Access: Stairs to enter [employer] of Steps: 4-5 Entrance Stairs-Rails: Left;Right Home Layout: Two level;Bed/bath upstairs Alternate Level Stairs-Number of Steps: flight   Bathroom Shower/Tub: Tub/shower unit;Curtain Shower/tub characteristics: Architectural technologist: Standard Bathroom Accessibility: Yes How Accessible: Accessible via walker Home Equipment: Walker - 4 wheels   Additional Comments: has downstairs bedroom and bath with tub shower with grabbars      Prior Functioning/Environment Level of Independence: Independent (drove)                 OT Problem List: Decreased strength;Decreased activity tolerance;Impaired balance (sitting and/or standing);Decreased safety awareness;Impaired UE functional use   OT Treatment/Interventions: Self-care/ADL training;Therapeutic exercise;Neuromuscular education;DME and/or AE instruction;Therapeutic activities;Cognitive remediation/compensation;Visual/perceptual remediation/compensation;Patient/family education;Balance training    OT Goals(Current goals can be found in the care plan section) Acute Rehab OT Goals Patient Stated Goal: To return to independent OT Goal Formulation: With patient Time For Goal Achievement: 11/11/16 Potential to Achieve Goals: Good  OT Frequency: Min 2X/week   Barriers to D/C:            Co-evaluation              End of Session Equipment Utilized During Treatment: Gait belt Nurse Communication: Mobility status  Activity Tolerance: Patient tolerated treatment well Patient left: in chair;with call bell/phone within reach;with chair alarm set   Time: [phone] OT Time Calculation (min): 32 min Charges:  OT General Charges $OT Visit: 1 Procedure OT Evaluation $OT Eval Moderate Complexity: 1 Procedure OT Treatments $Self Care/Home Management : 8-22 mins G-Codes:    [name],HILLARY 2016/11/22, 9:44 AM   Maurie Boettcher, OTR/L   [phone] 2016/11/22

## 2016-10-28 NOTE — NC FL2 (Signed)
[place] LEVEL OF CARE SCREENING TOOL     IDENTIFICATION  Patient Name: Justin [name] Birthdate: 11-06-1941 Sex: male Admission Date (Current Location): 10/25/2016  [facility] and [place] Number:  Herbalist and Address:  [facility], [place] [address], [place], [place] [address]      Provider Number: [id number]  Attending Physician Name and Address:  Rosalin Hawking, MD  Relative Name and Phone Number:       Current Level of Care: Hospital Recommended Level of Care: [place] Prior Approval Number:    Date Approved/Denied:   PASRR Number: [id number]  Discharge Plan: SNF    Current Diagnoses: Patient Active Problem List   Diagnosis Date Noted  . Stroke (cerebrum) ([place]) 10/28/2016  . Gait abnormality   . History of CVA (cerebrovascular accident)   . History of TIA (transient ischemic attack)   . Benign essential HTN   . Paroxysmal SVT (supraventricular tachycardia) ([place])   . Acute blood loss anemia   . Acute ischemic stroke ([place])   . CVA (cerebral vascular accident) ([place]) 10/26/2016  . History of recent stroke 06/06/2016  . 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 TIA (transient ischemic attack) 06/02/2016  . Numbness 06/01/2016  . Right arm numbness 06/01/2016  . CAD (coronary artery disease), native coronary artery 03/20/2016  . Hypertension   . GERD (gastroesophageal reflux disease)   . Gout   . S/P RF ablation operation for arrhythmia 12/13/2014  . Hyperlipidemia 12/13/2014  . Diabetes mellitus ([place]) 12/07/2014    Orientation RESPIRATION BLADDER Height & Weight     Self, Time, Situation, Place  Normal Continent Weight: 165 lb (74.8 kg) Height:  5\' 9"  (175.3 cm)  BEHAVIORAL SYMPTOMS/MOOD NEUROLOGICAL BOWEL NUTRITION STATUS      Continent Diet  AMBULATORY STATUS COMMUNICATION OF NEEDS Skin   Extensive Assist Verbally Normal                       Personal Care Assistance Level of  Assistance  Bathing, Dressing, Feeding Bathing Assistance: Limited assistance Feeding assistance: Independent Dressing Assistance: Limited assistance     Functional Limitations Info  Sight, Hearing, Speech Sight Info: Adequate Hearing Info: Adequate Speech Info: Adequate    SPECIAL CARE FACTORS FREQUENCY  PT (By licensed PT), OT (By licensed OT), Speech therapy     PT Frequency: 5 OT Frequency: 5     Speech Therapy Frequency: 5      Contractures Contractures Info: Not present    Additional Factors Info  Code Status, Allergies, Insulin Sliding Scale Code Status Info: Full Code Allergies Info: No known allergies   Insulin Sliding Scale Info: 3x/day with meals       Current Medications (10/28/2016):  This is the current hospital active medication list Current Facility-Administered Medications  Medication Dose Route Frequency Provider Last Rate Last Dose  . acetaminophen (TYLENOL) tablet 650 mg  650 mg Oral Q4H PRN Wallie Char       Or  . acetaminophen (TYLENOL) suppository 650 mg  650 mg Rectal Q4H PRN Wallie Char      . allopurinol (ZYLOPRIM) tablet 300 mg  300 mg Oral Daily Rosalin Hawking, MD   300 mg at 10/28/16 0948  . aspirin tablet 325 mg  325 mg Oral Daily Charles Stewart   325 mg at 10/28/16 0948  . atorvastatin (LIPITOR) tablet 40 mg  40 mg Oral q1800 Rosalin Hawking, MD      .  clopidogrel (PLAVIX) tablet 75 mg  75 mg Oral Daily Rosalin Hawking, MD   75 mg at 10/28/16 0948  . gadobenate dimeglumine (MULTIHANCE) injection 15 mL  15 mL Intravenous Once Rosalin Hawking, MD      . insulin aspart (novoLOG) injection 0-15 Units  0-15 Units Subcutaneous TID WC Rosalin Hawking, MD      . iopamidol (ISOVUE-300) 61 % injection           . pantoprazole (PROTONIX) EC tablet 40 mg  40 mg Oral Daily Wallie Char   40 mg at 10/28/16 [id number]     Discharge Medications: Please see discharge summary for a list of discharge medications.  Relevant Imaging Results:  Relevant Lab  Results:   Additional Information SSN:  [id number]  Darden Dates, LCSW

## 2016-10-28 NOTE — Progress Notes (Signed)
Physical Therapy Treatment Patient Details Name: Justin [name] MRN: [id number] DOB: 10-20-1941 Today's Date: 10/28/2016    History of Present Illness Justin [name] is an 75 y.o. male history of stroke in May 2017, TIA in June 2017, PFO, diabetes mellitus and hypertension, presenting with new onset loss of control of his left lower extremity with weakness and numbness.  CT scan of his head showed old bilateral basal ganglia infarctions, but no acute findings.  Patient was deemed a candidate for TPA because of the significant disability, with inability to bear weight weight and control movement of his left lower extremity acutely.    PT Comments    Pt with significant improvement in L LE strength however remains to have L LE ataxia, knee instability, numbness, and increased falls risk. Pt requires assist for safe transfers and ambulation. Attempted ambulation with R UE HHA however pt very unsteady and placed significant weight through PTs hand. Cont' to recommend CIR as pt shows excellent rehab potential and anticipate he would progress to mod I for safe d/c home.  Follow Up Recommendations  CIR     Equipment Recommendations  Rolling walker with 5" wheels    Recommendations for Other Services Rehab consult     Precautions / Restrictions Precautions Precautions: Fall Precaution Comments: L sided weakness Restrictions Weight Bearing Restrictions: No    Mobility  Bed Mobility               General bed mobility comments: up in chair  Transfers Overall transfer level: Needs assistance Equipment used: Rolling walker (2 wheeled) Transfers: Sit to/from Stand Sit to Stand: Min guard         General transfer comment: v/'cs for safe hand placement, increased time  Ambulation/Gait Ambulation/Gait assistance: Min assist;Mod assist Ambulation Distance (Feet): 150 Feet (x1, 50x1) Assistive device: Rolling walker (2 wheeled);1 person hand held assist Gait Pattern/deviations:  Step-through pattern;Decreased stride length;Decreased stance time - left;Antalgic;Narrow base of support Gait velocity: slow Gait velocity interpretation: Below normal speed for age/gender General Gait Details: pt with L LE ataxia, improved stability and foot clearance compared to initial eval however con't to report minimal sensation and requires external support. pt a heavy modA with R HHA. pt with noted L knee instability however no overt buckling   Stairs            Wheelchair Mobility    Modified Rankin (Stroke Patients Only) Modified Rankin (Stroke Patients Only) Pre-Morbid Rankin Score: No symptoms Modified Rankin: Moderately severe disability     Balance Overall balance assessment: Needs assistance Sitting-balance support: No upper extremity supported;Feet supported Sitting balance-Leahy Scale: Fair     Standing balance support: Bilateral upper extremity supported Standing balance-Leahy Scale: Poor Standing balance comment: UE support                    Cognition Arousal/Alertness: Awake/alert Behavior During Therapy: WFL for tasks assessed/performed Overall Cognitive Status: Within Functional Limits for tasks assessed                      Exercises Other Exercises Other Exercises: squats with increased WBing on the L x 10 reps x 2 sets Other Exercises: Lunges with L LE forward x 10 reps x 2 sets Other Exercises: L LE SLS stance    General Comments        Pertinent Vitals/Pain Pain Assessment: No/denies pain    Home Living Family/patient expects to be discharged to:: (P) Private residence Living Arrangements: (P)  Spouse/significant other Available Help at Discharge: (P) Family;Available PRN/intermittently Type of Home: (P) House Home Access: (P) Stairs to enter Entrance Stairs-Rails: (P) Left;Right Home Layout: (P) Two level;Bed/bath upstairs Home Equipment: (P) Walker - 4 wheels Additional Comments: (P) has downstairs bedroom and  bath with tub shower with grabbars    Prior Function Level of Independence: (P) Independent (drove)          PT Goals (current goals can now be found in the care plan section) Acute Rehab PT Goals Patient Stated Goal: To return to independent Progress towards PT goals: Progressing toward goals    Frequency    Min 4X/week      PT Plan Current plan remains appropriate    Co-evaluation             End of Session Equipment Utilized During Treatment: Gait belt Activity Tolerance: Patient tolerated treatment well Patient left: in chair;with call bell/phone within reach (OT came to see pt)     Time: 0830-0901 PT Time Calculation (min) (ACUTE ONLY): 31 min  Charges:  $Gait Training: 8-22 mins $Therapeutic Exercise: 8-22 mins                    G Codes:      Kingsley Callander 10/28/2016, 9:16 AM  Kittie Plater, PT, DPT Pager #: [phone] Office #: [phone]

## 2016-10-28 NOTE — Progress Notes (Signed)
  Echocardiogram 2D Echocardiogram has been performed.  Tresa Res 10/28/2016, 1:41 PM

## 2016-10-28 NOTE — Evaluation (Signed)
Speech Language Pathology Evaluation Patient Details Name: Makay Scheidt MRN: [id number] DOB: 12/14/1941 Today's Date: 10/28/2016 Time: [id number] SLP Time Calculation (min) (ACUTE ONLY): 18 min  Problem List:  Patient Active Problem List   Diagnosis Date Noted  . 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 Gait abnormality   . History of CVA (cerebrovascular accident)   . History of TIA (transient ischemic attack)   . Benign essential HTN   . Paroxysmal SVT (supraventricular tachycardia) ([place])   . Acute blood loss anemia   . Acute ischemic stroke ([place])   . CVA (cerebral vascular accident) ([place]) 10/26/2016  . History of recent stroke 06/06/2016  . 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 TIA (transient ischemic attack) 06/02/2016  . Numbness 06/01/2016  . Right arm numbness 06/01/2016  . CAD (coronary artery disease), native coronary artery 03/20/2016  . Hypertension   . GERD (gastroesophageal reflux disease)   . Gout   . S/P RF ablation operation for arrhythmia 12/13/2014  . Hyperlipidemia 12/13/2014  . Diabetes mellitus ([place]) 12/07/2014   Past Medical History:  Past Medical History:  Diagnosis Date  . Diabetes mellitus without complication ([place])   . GERD (gastroesophageal reflux disease)   . Gout   . Hypertension   . Paroxysmal SVT (supraventricular tachycardia) ([place])    a. s/p RFCA on 05/01/15  . Stroke [facility])    Past Surgical History:  Past Surgical History:  Procedure Laterality Date  . BASAL CELL CARCINOMA EXCISION     off of back  . ELECTROPHYSIOLOGIC STUDY N/A 05/01/2015   Procedure: SVT Ablation;  Surgeon: Evans Lance, MD;  Location: [place] [facility];  Service: Cardiovascular;  Laterality: N/A;  . EP IMPLANTABLE DEVICE N/A 06/04/2016   Procedure: Loop Recorder Insertion;  Surgeon: Thompson Grayer, MD;  Location: [place] [facility];  Service: Cardiovascular;  Laterality: N/A;  . SQUAMOUS CELL CARCINOMA EXCISION    . TEE WITHOUT CARDIOVERSION N/A 06/04/2016   Procedure: TRANSESOPHAGEAL ECHOCARDIOGRAM (TEE);  Surgeon: Pixie Casino, MD;  Location: [facility] ENDOSCOPY;  Service: Cardiovascular;  Laterality: N/A;   HPI:  Keli Loy is an 75 y.o. male history of stroke in May 2017, TIA in June 2017, PFO, diabetes mellitus and hypertension, presenting with new onset loss of control of his left lower extremity with weakness and numbness.  CT scan of his head showed old bilateral basal ganglia infarctions, but no acute findings.  Patient was deemed a candidate for TPA because of the significant disability, with inability to bear weight weight and control movement of his left lower extremity acutely.   Assessment / Plan / Recommendation Clinical Impression  Pt is presenting at his baseline cognitive level. He participated in the MoCA and scored a 27 (a score of 26 or above is considered typical). He self reports baseline trouble with short term memory tasks, but expressed no acute difficulties since hospitalization in terms of his communication and cognition. SLP completed education for if he should experience any difficulty at next level of care. Recommend no further ST at this time. Will s/o.    SLP Assessment  Patient does not need any further Speech Lanaguage Pathology Services    Follow Up Recommendations  Other (comment) (intermittent supervision)    Frequency and Duration           SLP Evaluation Cognition  Overall Cognitive Status: Within Functional Limits for tasks assessed Memory:  (pt self reports baseline short term memory difficulty)       Comprehension  Auditory Comprehension Overall Auditory Comprehension: Appears within functional limits for tasks assessed Visual Recognition/Discrimination Discrimination: Not tested Reading Comprehension Reading Status: Not tested    Expression Expression Primary Mode of Expression: Verbal Verbal Expression Overall Verbal Expression: Appears within functional limits for tasks assessed Written  Expression Written Expression: Not tested   Oral / Motor  Oral Motor/Sensory Function Overall Oral Motor/Sensory Function: Within functional limits Motor Speech Overall Motor Speech: Appears within functional limits for tasks assessed   GO                   ,Ezekiel Slocumb, Student SLP  Shela Leff 10/28/2016, 4:35 PM

## 2016-10-28 NOTE — Progress Notes (Signed)
STROKE TEAM PROGRESS NOTE   SUBJECTIVE (INTERVAL HISTORY) No family at the bedside. He seems back to his baseline. Loop recorder interrogation no afib. Pending pan CT to rule out malignancy.   OBJECTIVE Temp:  [97.6 F (36.4 C)-99.7 F (37.6 C)] 99.3 F (37.4 C) (11/06 2137) Pulse Rate:  [72-94] 94 (11/06 2137) Resp:  [18-20] 18 (11/06 2137) BP: (105-116)/(54-67) 109/58 (11/06 2137) SpO2:  [96 %-100 %] 98 % (11/06 2137)  CBC:   Recent Labs Lab 10/25/16 2211 10/25/16 2224 10/27/16 0410  WBC 2.9*  --  6.5  NEUTROABS 1.5*  --  5.0  HGB 10.4* 10.9* 10.4*  HCT 31.0* 32.0* 32.0*  MCV 87.6  --  87.4  PLT 269  --  [id number]    Basic Metabolic Panel:   Recent Labs Lab 10/25/16 2211 10/25/16 2224 10/27/16 0410  NA 136 140 137  K 3.9 3.9 4.2  CL 104 102 105  CO2 21*  --  24  GLUCOSE 151* 153* 127*  BUN 25* 27* 25*  CREATININE 1.01 1.00 1.00  CALCIUM 9.2  --  8.6*    Lipid Panel:     Component Value Date/Time   CHOL 100 10/26/2016 0423   TRIG 74 10/26/2016 0423   HDL 30 (L) 10/26/2016 0423   CHOLHDL 3.3 10/26/2016 0423   VLDL 15 10/26/2016 0423   LDLCALC 55 10/26/2016 0423   HgbA1c:  Lab Results  Component Value Date   HGBA1C 6.7 (H) 10/26/2016   Urine Drug Screen:     Component Value Date/Time   LABOPIA NONE DETECTED 10/25/2016 2255   COCAINSCRNUR NONE DETECTED 10/25/2016 2255   LABBENZ NONE DETECTED 10/25/2016 2255   AMPHETMU NONE DETECTED 10/25/2016 2255   THCU NONE DETECTED 10/25/2016 2255   LABBARB NONE DETECTED 10/25/2016 2255      IMAGING I have personally reviewed the radiological images below and agree with the radiology interpretations.  Ct Head Code Stroke W/o Cm 10/25/2016 1. No acute intracranial process. Stable examination including multiple old basal ganglia infarcts and moderate to severe chronic small vessel ischemic disease.  2. ASPECTS score is 10.   MRI / MRA 10/26/16 1. Patchy small volume acute ischemic infarcts involving the  posterior right centrum semi ovale and parasagittal right parietal cortex. No associated hemorrhage or mass effect.  2. No other acute intracranial process identified. 3. Moderate chronic microvascular ischemic disease. 4. Normal MRA of the circle of Willis and intracranial arteries.  Carotid Duplex (Doppler)    Findings suggest 1-39% internal carotid artery stenosis bilaterally. Vertebral arteries are patent with antegrade flow.  Bilateral lower extremity venous duplex  Bilateral lower extremities are negative for deep vein thrombosis. There is no evidence of Baker's cyst bilaterally.  TTE - Left ventricle: The cavity size was normal. Wall thickness was   normal. Systolic function was normal. The estimated ejection   fraction was in the range of 60% to 65%. Wall motion was normal;   there were no regional wall motion abnormalities. Doppler   parameters are consistent with abnormal left ventricular   relaxation (grade 1 diastolic dysfunction). - Mitral valve: Calcified annulus. Impressions: - No cardiac source of emboli was indentified.  Pan CT 10/28/2016 IMPRESSION: 1. No acute abnormality. 2. No significant change in an 8.8 cm Bosniak category 70F upper pole right renal cyst. A follow up abdomen CT with contrast is recommended in 1 year. 3. Moderately to markedly enlarged prostate gland. 4. Coronary artery atherosclerosis and aortic atherosclerosis.   Mr Lumbar  Spine W Wo Contrast 10/28/2016 IMPRESSION: 1. No explanation for left lower extremity symptoms. 2. Noncompressive degenerative changes are described above. 3. 21 mm subcutaneous mass over the left iliac crest that was not seen on 09/06/2015 abdominal CT. Lack of internal enhancement and surrounding soft tissue stranding favors contusion/hematoma. If no corroborating history, could follow-up with sonography in 3-6 months.    Physical Exam Temp:  [97.6 F (36.4 C)-99.7 F (37.6 C)] 99.3 F (37.4 C) (11/06 2137) Pulse Rate:   [72-94] 94 (11/06 2137) Resp:  [18-20] 18 (11/06 2137) BP: (105-116)/(54-67) 109/58 (11/06 2137) SpO2:  [96 %-100 %] 98 % (11/06 2137)  General - Well nourished, well developed, in no apparent distress.  Ophthalmologic - Fundi not visualized due to small pupils.  Cardiovascular - Regular rate and rhythm.  Mental Status -  Level of arousal and orientation to time, place, and person were intact. Language including expression, naming, repetition, comprehension was assessed and found intact. Fund of Knowledge was assessed and was intact.  Cranial Nerves II - XII - II - Visual field intact OU. III, IV, VI - Extraocular movements intact. V - Facial sensation intact bilaterally. VII - Facial movement intact bilaterally. VIII - Hearing & vestibular intact bilaterally. X - Palate elevates symmetrically. XI - Chin turning & shoulder shrug intact bilaterally. XII - Tongue protrusion intact.  Motor Strength - The patient's strength was normal in all extremities and pronator drift was absent.  Bulk was normal and fasciculations were absent.   Motor Tone - Muscle tone was assessed at the neck and appendages and was normal.  Reflexes - The patient's reflexes were 1+ in all extremities and he had no pathological reflexes.  Sensory - Light touch, temperature/pinprick were assessed and were symmetrical.    Coordination - The patient had normal movements in the hands with no ataxia or dysmetria.  Tremor was absent.  Gait and Station - deferred.   ASSESSMENT/PLAN [name] is a 75 y.o. male with history of previous strokes/TIA, PFO, paroxysmal supraventricular tachycardia, hypertension, diabetes mellitus, and gout presenting with left lower extremity weakness and numbness. He did receive IV t-PA   Stroke:  Right MCA/ACA and right ACA punctate infarcts, embolic with unknown source  Resultant  Deficit resolved  MRI / MRA Right MCA/ACA and right ACA punctate infarcts.  Carotid Doppler  unremarkable  2D Echo EF 60-65%  Pan CT negative for malignancy  LE venous doppler neg for DVT  Loop recorder interrogation no afib  TEE previous showed PFO  LDL 55  HgbA1c 6.7  VTE prophylaxis - SCDs Diet regular Room service appropriate? Yes; Fluid consistency: Thin  clopidogrel 75 mg daily prior to admission, now on DAPT for 3 months and then plavix alone  Ongoing aggressive stroke risk factor management  Therapy recommendations: pending  Disposition: Pending  Has been following with Dr. Leonie [facility] at [facility]  History of stroke / TIA  06/18/16 right sided numbness  MRI no infarct, consider TIA  MRA/CUS/TTE, DVT all normal  TEE showed PFO  A1C 7.5 and LDL 52  On plavix at home  Hypertension  Stable Permissive hypertension (OK if < 220/120) but gradually normalize in 5-7 days Long-term BP goal normotensive  Hyperlipidemia  Home meds: Lipitor 40 mg daily - resumed in hospital  LDL 55, goal < 70  Continue statin at discharge  Diabetes  HgbA1c 6.7, goal < 7.0  Controlled  Other Stroke Risk Factors  Advanced age  Hx stroke/TIA  Family hx stroke (mother)  PFO  Other Active Problems    Hospital day # 2   Rosalin Hawking, MD PhD Stroke Neurology 10/28/2016 11:11 PM    To contact Stroke Continuity provider, please refer to [email or web address] After hours, contact General Neurology

## 2016-10-28 NOTE — Consult Note (Signed)
Physical Medicine and Rehabilitation Consult Reason for Consult: Patchy small volume acute ischemic infarcts posterior right centrum semi-ovale and parasagittal right parietal cortex Referring Physician: Dr. Erlinda Hong   HPI: Justin [name] is a 75 y.o. 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 Per chart review patient lives with spouse. Independent prior to admission and driving. Wife works during the day. 2 level home with bedroom downstairs. 4 steps to entry. Presented 10/25/2016 with left lower extremity weakness. Cranial CT scan negative for acute changes. MRI showed patchy small volume acute ischemic infarct involving the posterior right centrum semiovale ovale and parasagittal right parietal cortex. No associated hemorrhage or mass effect. Patient did receive TPA.. MRA of the head unremarkable. Carotid Dopplers with no ICA stenosis. Venous Doppler studies lower extremities negative. Echocardiogram pending. Neurology follow-up currently maintained on aspirin and Plavix for CVA prophylaxis with workup ongoing. Physical therapy evaluation completed 10/26/2016 with recommendations of physical medicine rehabilitation consult.   Review of Systems  Constitutional: Negative for chills and fever.  HENT: Negative for tinnitus.   Eyes: Negative for blurred vision and double vision.  Respiratory: Negative for cough and shortness of breath.   Cardiovascular: Negative for chest pain.  Gastrointestinal: Positive for constipation. Negative for nausea and vomiting.  Genitourinary: Negative for dysuria and hematuria.  Musculoskeletal: Positive for myalgias.  Skin: Negative for rash.  Neurological: Positive for sensory change and focal weakness. Negative for seizures.  All other systems reviewed and are negative.  Past Medical History:  Diagnosis Date  . Diabetes mellitus without  complication ([place])   . GERD (gastroesophageal reflux disease)   . Gout   . Hypertension   . Paroxysmal SVT (supraventricular tachycardia) ([place])    a. s/p RFCA on 05/01/15  . Stroke [facility])    Past Surgical History:  Procedure Laterality Date  . BASAL CELL CARCINOMA EXCISION     off of back  . ELECTROPHYSIOLOGIC STUDY N/A 05/01/2015   Procedure: SVT Ablation;  Surgeon: Evans Lance, MD;  Location: [place] [facility];  Service: Cardiovascular;  Laterality: N/A;  . EP IMPLANTABLE DEVICE N/A 06/04/2016   Procedure: Loop Recorder Insertion;  Surgeon: Thompson Grayer, MD;  Location: [place] [facility];  Service: Cardiovascular;  Laterality: N/A;  . SQUAMOUS CELL CARCINOMA EXCISION    . TEE WITHOUT CARDIOVERSION N/A 06/04/2016   Procedure: TRANSESOPHAGEAL ECHOCARDIOGRAM (TEE);  Surgeon: Pixie Casino, MD;  Location[facility] ENDOSCOPY;  Service: Cardiovascular;  Laterality: N/A;   Family History  Problem Relation Age of Onset  . Stroke Mother   . Hypertension Mother   . Alzheimer's disease Father   . Heart failure Father   . Down syndrome Daughter    Social History:  reports that he has never smoked. He has never used smokeless tobacco. He reports that he does not drink alcohol or use drugs. Allergies: No Known Allergies Medications Prior to Admission  Medication Sig Dispense Refill  . atorvastatin (LIPITOR) 40 MG tablet Take 1 tablet (40 mg total) by mouth every morning. 90 tablet 3  . clopidogrel (PLAVIX) 75 MG tablet Take 1 tablet (75 mg total) by mouth daily. 90 tablet 3  . allopurinol (ZYLOPRIM) 300 MG tablet Take 300 mg by mouth.    . doxycycline (VIBRA-TABS) 100 MG tablet Take 100 mg by mouth 2 (two) times daily. Can not confirm if patient started medication. However, patient did pick  up from pharmacy.    . fluorouracil (EFUDEX) 5 % cream Apply 1 application topically daily as needed (for skin cancer prevention).   0  . ibuprofen (ADVIL,MOTRIN) 200 MG tablet Take 400 mg by mouth daily as  needed for moderate pain.     . metFORMIN (GLUCOPHAGE) 500 MG tablet Take 2 tablets (1,000 mg total) by mouth 2 (two) times daily with a meal. (Patient not taking: Reported on 10/26/2016) 60 tablet 3  . nitroGLYCERIN (NITROSTAT) 0.4 MG SL tablet Place 1 tablet (0.4 mg total) under the tongue every 5 (five) minutes as needed for chest pain. 25 tablet 3  . omeprazole (PRILOSEC) 20 MG capsule Take 20 mg by mouth every morning.     [name] Kitchen telmisartan-hydrochlorothiazide (MICARDIS HCT) 80-12.5 MG tablet TAKE ONE-HALF (1/2) TABLET DAILY (DISCARD REMAINDER AFTER OPENING). (Patient not taking: Reported on 10/26/2016) 90 tablet 1  . triamcinolone cream (KENALOG) 0.1 % APP EXT AA BID  0    Home: Home Living Family/patient expects to be discharged to:: Private residence Living Arrangements: Spouse/significant other Available Help at Discharge: Family, Available PRN/intermittently Type of Home: House Home Access: Stairs to enter Technical brewer of Steps: 4-5 Entrance Stairs-Rails: Left, Right Home Layout: Two level, Bed/bath upstairs Alternate Level Stairs-Number of Steps: flight Alternate Level Stairs-Rails: Right Bathroom Shower/Tub: Chiropodist: Standard Home Equipment: Environmental consultant - 4 wheels Additional Comments: has downstairs bedroom and bath with tub shower with grabbars  Functional History: Prior Function Level of Independence: Independent Functional Status:  Mobility: Bed Mobility General bed mobility comments: up in chair Transfers Overall transfer level: Needs assistance Equipment used: Rolling walker (2 wheeled) Transfers: Sit to/from Stand Sit to Stand: Min assist General transfer comment: steadying help, cues for hand placement Ambulation/Gait Ambulation/Gait assistance: Min assist, Mod assist Ambulation Distance (Feet): 200 Feet Assistive device: Rolling walker (2 wheeled) Gait Pattern/deviations: Step-through pattern, Decreased step length - right, Decreased  stance time - left, Drifts right/left General Gait Details: assist for safety, L knee buckling throughout, but able to manage with assist and walker for support; noted increased dragging L leg with fatigue during ambulation,  cues for placement to avoid tripping/falling as turning and backing up to chair with increased assist needed Gait velocity: very slow, focused on knee control throughout    ADL:    Cognition: Cognition Overall Cognitive Status: Within Functional Limits for tasks assessed Orientation Level: Oriented X4 Cognition Arousal/Alertness: Awake/alert Behavior During Therapy: WFL for tasks assessed/performed Overall Cognitive Status: Within Functional Limits for tasks assessed  Blood pressure 116/67, pulse 72, temperature 97.6 F (36.4 C), temperature source Oral, resp. rate 18, height 5\' 9"  (1.753 m), weight 74.8 kg (165 lb), SpO2 98 %. Physical Exam  Vitals reviewed. Constitutional: He is oriented to person, place, and time. He appears well-developed and well-nourished.  HENT:  Head: Normocephalic and atraumatic.  Eyes: Conjunctivae and EOM are normal.  Neck: Normal range of motion. Neck supple. No thyromegaly present.  Cardiovascular: Normal rate and regular rhythm.   Respiratory: Effort normal and breath sounds normal. No respiratory distress.  GI: Soft. Bowel sounds are normal. He exhibits no distension.  Musculoskeletal: He exhibits no edema or tenderness.  Neurological: He is alert and oriented to person, place, and time.  Follows full commands DTRs symmetric Motor: RUE/RLE: 5/5 proximal to distal LUE: Should abduction, elbow flexion 4+/5, elbox extension, wrist extension, hang grip 4/5 LLE: 4+/5 proximal to distal  Skin: Skin is warm and dry.  Psychiatric: He has a normal mood  and affect. His behavior is normal. Thought content normal.    Results for orders placed or performed during the hospital encounter of 10/25/16 (from the past 24 hour(s))  Glucose,  capillary     Status: Abnormal   Collection Time: 10/28/16  6:25 AM  Result Value Ref Range   Glucose-Capillary 120 (H) 65 - 99 mg/dL   Comment 1 Notify RN    Comment 2 Document in Chart    Mr Brain Wo Contrast  Result Date: 10/26/2016 CLINICAL DATA:  Initial evaluation for acute left lower extremity weakness. EXAM: MRI HEAD WITHOUT CONTRAST TECHNIQUE: Multiplanar, multiecho pulse sequences of the brain and surrounding structures were obtained without intravenous contrast. COMPARISON:  Comparison made with prior CT from 10/25/2016. FINDINGS: Brain: Mild age-related cerebral volume loss. Patchy T2/FLAIR hyperintensity within the periventricular and deep white matter both cerebral hemispheres most compatible chronic microvascular ischemic disease, moderate nature. There are patchy small volume acute ischemic infarcts within the posterior right centrum semi ovale and (series 3, image 37). Minimal patchy overlying cortical infarct within the parasagittal right parietal lobe (series 3, image a 43). No associated hemorrhage or mass effect. No other evidence for acute ischemia. Gray-white matter differentiation otherwise maintained. No mass lesion, midline shift, or mass effect. No hydrocephalus. No extra-axial fluid collection. Major dural sinuses are grossly patent. Pituitary gland within normal limits. Vascular: Major intracranial vascular flow voids are maintained. Skull and upper cervical spine: Craniocervical junction normal. Visualized upper cervical spine unremarkable without significant degenerative changes are stenosis. Bone marrow signal intensity normal. No scalp soft tissue abnormality. Sinuses/Orbits: Globes and orbital soft tissues within normal limits. Paranasal sinuses are clear. Trace opacity left mastoid air cells. Inner ear structures within normal limits. IMPRESSION: 1. Patchy small volume acute ischemic infarcts involving the posterior right centrum semi ovale and parasagittal right parietal  cortex. No associated hemorrhage or mass effect. The 2. No other acute intracranial process identified. 3. Moderate chronic microvascular ischemic disease. Electronically Signed   By: Jeannine Boga M.D.   On: 10/26/2016 23:00   Mr Lumbar Spine W Wo Contrast  Result Date: 10/28/2016 CLINICAL DATA:  Acute left lower extremity weakness and numbness with ataxia. EXAM: MRI LUMBAR SPINE WITHOUT AND WITH CONTRAST TECHNIQUE: Multiplanar and multiecho pulse sequences of the lumbar spine were obtained without and with intravenous contrast. CONTRAST:  20 cc MultiHance intravenous COMPARISON:  None. FINDINGS: Segmentation:  Standard. Alignment:  Physiologic. Vertebrae: Heterogeneous marrow without focal lesion. Chronic L2 and T11 inferior endplate Schmorl's nodes. No acute fracture. Conus medullaris: Extends to the T12-L1 disc level and appears normal. No thickening or abnormal enhancement of the cauda equina. Paraspinal and other soft tissues: Renal cysts characterized by CT in 2016. Hypo intense nonenhancing subcutaneous nodule over the left iliac crest which was not seen on 2016 CT, but would have been at the edge of inferior coverage. Maximal dimension is 21 mm. There is surrounding fat reticulation. Disc levels: T12- L1: Unremarkable. L1-L2: Unremarkable. L2-L3: Mild disc narrowing and foraminal bulging. Mild facet spurring. No impingement L3-L4: Mild disc bulging. Facet arthropathy with mild spurring. No impingement L4-L5: Disc narrowing and right predominant bulging and left foraminal annular fissure. Degenerative facet and ligament overgrowth. Mild triangular narrowing of the thecal sac. No impingement L5-S1:Greatest level degenerative disc narrowing with endplate ridging. Negative facets. No impingement IMPRESSION: 1. No explanation for left lower extremity symptoms. 2. Noncompressive degenerative changes are described above. 3. 21 mm subcutaneous mass over the left iliac crest that was not seen on  09/06/2015 abdominal CT. Lack of internal enhancement and surrounding soft tissue stranding favors contusion/hematoma. If no corroborating history, could follow-up with sonography in 3-6 months. Electronically Signed   By: Monte Fantasia M.D.   On: 10/28/2016 08:22   Dg Chest Port 1 View  Result Date: 10/27/2016 CLINICAL DATA:  Stroke, chest pain, hypertension and diabetes EXAM: PORTABLE CHEST 1 VIEW COMPARISON:  16 2017 FINDINGS: Normal heart size and vascularity. Aorta is atherosclerotic and ectatic. Loop recorder device over the heart. Lungs remain clear. No focal pneumonia, collapse or consolidation. Negative for edema, effusion or pneumothorax. Trachea is midline. No acute osseous finding. IMPRESSION: No acute chest process. Thoracic aortic atherosclerosis Electronically Signed   By: Jerilynn Mages.  Shick M.D.   On: 10/27/2016 08:09   [name] Head/brain [id number] Cm  Result Date: 10/26/2016 CLINICAL DATA:  24 hours post tPA.  Recent acute stroke. EXAM: MRA HEAD WITHOUT CONTRAST TECHNIQUE: Angiographic images of the Circle of Willis were obtained using MRA technique without intravenous contrast. COMPARISON:  Brain MRI 10/26/2016 FINDINGS: Intracranial internal carotid arteries: Normal. Anterior cerebral arteries: Normal. Middle cerebral arteries: Normal. Posterior communicating arteries: Present on the right. Not seen on the left. Posterior cerebral arteries: Fetal origin of the right posterior cerebral artery. Otherwise normal bilaterally. Basilar artery: Normal. Vertebral arteries: Codominant. Normal. Superior cerebellar arteries: Normal. Anterior inferior cerebellar arteries: Not visualized, which is not uncommon. Posterior inferior cerebellar arteries: Normal. IMPRESSION: Normal MRA of the circle of Willis and intracranial arteries. Electronically Signed   By: Ulyses Jarred M.D.   On: 10/26/2016 23:19    Assessment/Plan: Diagnosis: Acute ischemic infarcts posterior right centrum semi-ovale and parasagittal right  parietal cortex Labs and images independently reviewed.  Records reviewed and summated above. Stroke: Continue secondary stroke prophylaxis and Risk Factor Modification listed below:   Antiplatelet therapy:   Blood Pressure Management:  Continue current medication with prn's with permisive HTN per primary team Statin Agent:   Diabetes management:   Left sided hemiparesis Motor recovery: Fluoxetine  1. Does the need for close, 24 hr/day medical supervision in concert with the patient's rehab needs make it unreasonable for this patient to be served in a less intensive setting? Yes  2. 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 Due to bladder management, safety, disease management and patient education, does the patient require 24 hr/day rehab nursing? Yes 4. Does the patient require coordinated care of a physician, rehab nurse, PT (1-2 hrs/day, 5 days/week) and OT (1-2 hrs/day, 5 days/week) to address physical and functional deficits in the context of the above medical diagnosis(es)? Yes Addressing deficits in the following areas: balance, endurance, locomotion, strength, transferring, bathing, dressing, toileting and psychosocial support 5. Can the patient actively participate in an intensive therapy program of at least 3 hrs of therapy per day at least 5 days per week? Yes 6. The potential for patient to make measurable gains while on inpatient rehab is excellent 7. Anticipated functional outcomes upon discharge from inpatient rehab are modified independent and supervision  with  PT, modified independent with OT, n/a with SLP. 8. Estimated rehab length of stay to reach the above functional goals is: 8-14 days. 9. Does  the patient have adequate social supports and living environment to accommodate these discharge functional goals? Yes 10. Anticipated D/C setting: Home 11. Anticipated post D/C treatments: HH therapy and Home excercise program 12. Overall Rehab/Functional Prognosis: good  RECOMMENDATIONS: This patient's condition is appropriate for continued rehabilitative care in the following setting: CIR Patient has agreed to participate in recommended program. Yes Note that insurance prior authorization may be required for reimbursement for recommended care.  Comment: Rehab Admissions Coordinator to follow up.  Delice Lesch, MD, Mellody Drown 10/28/2016

## 2016-10-28 NOTE — Progress Notes (Signed)
Rehab admissions - Attempted to see patient today, but he was with therapist.  I will follow up with patient and family in the am for potential acute inpatient rehab admission.  Call me for questions.  RC:[phone]

## 2016-10-29 ENCOUNTER — Encounter (HOSPITAL_COMMUNITY): Payer: Self-pay | Admitting: Radiology

## 2016-10-29 ENCOUNTER — Inpatient Hospital Stay (HOSPITAL_COMMUNITY): Payer: Medicare Other

## 2016-10-29 DIAGNOSIS — Q211 Atrial septal defect: Secondary | ICD-10-CM

## 2016-10-29 LAB — GLUCOSE, CAPILLARY
GLUCOSE-CAPILLARY: 111 mg/dL — AB (ref 65–99)
Glucose-Capillary: 109 mg/dL — ABNORMAL HIGH (ref 65–99)
Glucose-Capillary: 151 mg/dL — ABNORMAL HIGH (ref 65–99)
Glucose-Capillary: 117 mg/dL — ABNORMAL HIGH (ref 65–99)

## 2016-10-29 LAB — VAS US CAROTID
LCCADDIAS: -15 cm/s
LEFT ECA DIAS: -11 cm/s
LEFT VERTEBRAL DIAS: 15 cm/s
LICADDIAS: -25 cm/s
LICADSYS: -100 cm/s
LICAPDIAS: -15 cm/s
LICAPSYS: -82 cm/s
Left CCA dist sys: -99 cm/s
Left CCA prox dias: 11 cm/s
Left CCA prox sys: 104 cm/s
RIGHT ECA DIAS: -12 cm/s
RIGHT VERTEBRAL DIAS: 18 cm/s
Right CCA prox dias: 13 cm/s
Right CCA prox sys: 88 cm/s
Right cca dist sys: -90 cm/s

## 2016-10-29 MED ORDER — IOPAMIDOL (ISOVUE-370) INJECTION 76%
INTRAVENOUS | Status: AC
  Administered 2016-10-29: 50 mL
  Filled 2016-10-29: qty 50

## 2016-10-29 NOTE — Progress Notes (Signed)
Physical Therapy Treatment Patient Details Name: Justin [name] MRN: [id number] DOB: 1941-04-13 Today's Date: 10/29/2016    History of Present Illness Justin [name] is an 75 y.o. male history of stroke in May 2017, TIA in June 2017, PFO, diabetes mellitus and hypertension, presenting with new onset loss of control of his left lower extremity with weakness and numbness.  CT scan of his head showed old bilateral basal ganglia infarctions, but no acute findings.  Patient was deemed a candidate for TPA because of the significant disability, with inability to bear weight weight and control movement of his left lower extremity acutely.    PT Comments    The pt is requiring less assistance with gait and transfers.  His gait distance increased, and he tolerated high level balance activities with minimal assistance.  Pt would benefit from continued PT to increase his strength and balance. Spoke with supervising Pt regarding progress.  PT in agreement to update reccomendations to [place] with use of RW at all times. Continue with POC emphasizing balance.  Follow Up Recommendations  Home health PT;Supervision - Intermittent     Equipment Recommendations  Rolling walker with 5" wheels    Recommendations for Other Services     Precautions / Restrictions Precautions Precautions: Fall Precaution Comments: L sided weakness Restrictions Weight Bearing Restrictions: No    Mobility  Bed Mobility Overal bed mobility: Needs Assistance Bed Mobility: Supine to Sit     Supine to sit: Min guard;Modified independent (Device/Increase time)     General bed mobility comments: no assistance needed  Transfers Overall transfer level: Needs assistance Equipment used: Rolling walker (2 wheeled) Transfers: Sit to/from Stand Sit to Stand: Min guard         General transfer comment: increased time  Ambulation/Gait Ambulation/Gait assistance: Min guard;Min assist (min quard with RW and min assist  without RW) Ambulation Distance (Feet): 200 Feet (100 with RW; 100 without RW) Assistive device: Rolling walker (2 wheeled);None Gait Pattern/deviations: Step-through pattern;Decreased stride length Gait velocity: slow Gait velocity interpretation: Below normal speed for age/gender General Gait Details: pt still reports decreased sensation in LLE but his stability has increased.   Stairs            Wheelchair Mobility    Modified Rankin (Stroke Patients Only)       Balance Overall balance assessment: Modified Independent Sitting-balance support: No upper extremity supported Sitting balance-Leahy Scale: Fair     Standing balance support: Bilateral upper extremity supported Standing balance-Leahy Scale: Fair               High level balance activites: Turns;Sudden stops;Head turns;Direction changes High Level Balance Comments: pt turned in circles, stepped over and around object on floor, and with increased gait speed.  LOB during stepping over exercise.  Slight instability at times but pt self-corrected.    Cognition Arousal/Alertness: Awake/alert Behavior During Therapy: WFL for tasks assessed/performed Overall Cognitive Status: Within Functional Limits for tasks assessed                      Exercises      General Comments        Pertinent Vitals/Pain Pain Assessment: Faces Faces Pain Scale: No hurt    Home Living                      Prior Function            PT Goals (current goals can now be found  in the care plan section) Acute Rehab PT Goals Patient Stated Goal: To return to independent PT Goal Formulation: With patient Time For Goal Achievement: 11/02/16 Potential to Achieve Goals: Good Progress towards PT goals: Progressing toward goals    Frequency    Min 4X/week      PT Plan Discharge plan needs to be updated    Co-evaluation             End of Session Equipment Utilized During Treatment: Gait  belt Activity Tolerance: Patient tolerated treatment well Patient left: in chair;with call bell/phone within reach     Time:  -     Charges:                       G Codes:      Bary Castilla November 16, 2016, 12:13 PM Rito Ehrlich. Nida Boatman Pager: [phone]

## 2016-10-29 NOTE — Progress Notes (Signed)
STROKE TEAM PROGRESS NOTE   SUBJECTIVE (INTERVAL HISTORY) Met wife on her way out and updated her about pt condition. Saw pt in room and he has been doing well and PT recommended home health PT. Pan CT no malignancy. Pending CTA neck.   OBJECTIVE Temp:  [98.1 F (36.7 C)-99.3 F (37.4 C)] 98.1 F (36.7 C) (11/07 1824) Pulse Rate:  [73-94] 73 (11/07 1824) Cardiac Rhythm: Normal sinus rhythm (11/07 0700) Resp:  [18-20] 18 (11/07 1824) BP: (99-128)/(56-68) 128/68 (11/07 1824) SpO2:  [97 %-100 %] 100 % (11/07 1824)  CBC:   Recent Labs Lab 10/25/16 2211 10/25/16 2224 10/27/16 0410  WBC 2.9*  --  6.5  NEUTROABS 1.5*  --  5.0  HGB 10.4* 10.9* 10.4*  HCT 31.0* 32.0* 32.0*  MCV 87.6  --  87.4  PLT 269  --  353    Basic Metabolic Panel:   Recent Labs Lab 10/25/16 2211 10/25/16 2224 10/27/16 0410  NA 136 140 137  K 3.9 3.9 4.2  CL 104 102 105  CO2 21*  --  24  GLUCOSE 151* 153* 127*  BUN 25* 27* 25*  CREATININE 1.01 1.00 1.00  CALCIUM 9.2  --  8.6*    Lipid Panel:     Component Value Date/Time   CHOL 100 10/26/2016 0423   TRIG 74 10/26/2016 0423   HDL 30 (L) 10/26/2016 0423   CHOLHDL 3.3 10/26/2016 0423   VLDL 15 10/26/2016 0423   LDLCALC 55 10/26/2016 0423   HgbA1c:  Lab Results  Component Value Date   HGBA1C 6.7 (H) 10/26/2016   Urine Drug Screen:     Component Value Date/Time   LABOPIA NONE DETECTED 10/25/2016 2255   COCAINSCRNUR NONE DETECTED 10/25/2016 2255   LABBENZ NONE DETECTED 10/25/2016 2255   AMPHETMU NONE DETECTED 10/25/2016 2255   THCU NONE DETECTED 10/25/2016 2255   LABBARB NONE DETECTED 10/25/2016 2255      IMAGING I have personally reviewed the radiological images below and agree with the radiology interpretations.  Ct Head Code Stroke W/o Cm 10/25/2016 1. No acute intracranial process. Stable examination including multiple old basal ganglia infarcts and moderate to severe chronic small vessel ischemic disease.  2. ASPECTS score is  10.   MRI / MRA 10/26/16 1. Patchy small volume acute ischemic infarcts involving the posterior right centrum semi ovale and parasagittal right parietal cortex. No associated hemorrhage or mass effect.  2. No other acute intracranial process identified. 3. Moderate chronic microvascular ischemic disease. 4. Normal MRA of the circle of Willis and intracranial arteries.  Carotid Duplex (Doppler)    Findings suggest 1-39% internal carotid artery stenosis bilaterally. Vertebral arteries are patent with antegrade flow.  Bilateral lower extremity venous duplex  Bilateral lower extremities are negative for deep vein thrombosis. There is no evidence of Baker's cyst bilaterally.  TTE - Left ventricle: The cavity size was normal. Wall thickness was   normal. Systolic function was normal. The estimated ejection   fraction was in the range of 60% to 65%. Wall motion was normal;   there were no regional wall motion abnormalities. Doppler   parameters are consistent with abnormal left ventricular   relaxation (grade 1 diastolic dysfunction). - Mitral valve: Calcified annulus. Impressions: - No cardiac source of emboli was indentified.  Pan CT 10/28/2016 IMPRESSION: 1. No acute abnormality. 2. No significant change in an 8.8 cm Bosniak category 78F upper pole right renal cyst. A follow up abdomen CT with contrast is recommended in 1  year. 3. Moderately to markedly enlarged prostate gland. 4. Coronary artery atherosclerosis and aortic atherosclerosis.   Mr Lumbar Spine W Wo Contrast 10/28/2016 IMPRESSION: 1. No explanation for left lower extremity symptoms. 2. Noncompressive degenerative changes are described above. 3. 21 mm subcutaneous mass over the left iliac crest that was not seen on 09/06/2015 abdominal CT. Lack of internal enhancement and surrounding soft tissue stranding favors contusion/hematoma. If no corroborating history, could follow-up with sonography in 3-6 months.   Ct Angio Neck W Or  Wo Contrast 10/29/2016 IMPRESSION: 1. Mild atherosclerotic changes at the carotid bifurcations bilaterally, left greater than right without significant stenosis on either side. 2. Mild narrowing of the vertebral arteries at the dural margin bilaterally without a significant stenosis of greater than 50% relative to the more distal vessel. 3. Mild atherosclerotic disease at the aortic arch. 4. Multilevel degenerate changes in the cervical spine.    Physical Exam Temp:  [98.1 F (36.7 C)-99.3 F (37.4 C)] 98.1 F (36.7 C) (11/07 1824) Pulse Rate:  [73-94] 73 (11/07 1824) Resp:  [18-20] 18 (11/07 1824) BP: (99-128)/(56-68) 128/68 (11/07 1824) SpO2:  [97 %-100 %] 100 % (11/07 1824)  General - Well nourished, well developed, in no apparent distress.  Ophthalmologic - Fundi not visualized due to small pupils.  Cardiovascular - Regular rate and rhythm.  Mental Status -  Level of arousal and orientation to time, place, and person were intact. Language including expression, naming, repetition, comprehension was assessed and found intact. Fund of Knowledge was assessed and was intact.  Cranial Nerves II - XII - II - Visual field intact OU. III, IV, VI - Extraocular movements intact. V - Facial sensation intact bilaterally. VII - Facial movement intact bilaterally. VIII - Hearing & vestibular intact bilaterally. X - Palate elevates symmetrically. XI - Chin turning & shoulder shrug intact bilaterally. XII - Tongue protrusion intact.  Motor Strength - The patient's strength was normal in all extremities and pronator drift was absent.  Bulk was normal and fasciculations were absent.   Motor Tone - Muscle tone was assessed at the neck and appendages and was normal.  Reflexes - The patient's reflexes were 1+ in all extremities and he had no pathological reflexes.  Sensory - Light touch, temperature/pinprick were assessed and were symmetrical.    Coordination - The patient had normal  movements in the hands with no ataxia or dysmetria.  Tremor was absent.  Gait and Station - deferred.   ASSESSMENT/PLAN [name] is a 75 y.o. male with history of previous strokes/TIA, PFO, paroxysmal supraventricular tachycardia, hypertension, diabetes mellitus, and gout presenting with left lower extremity weakness and numbness. He did receive IV t-PA   Stroke:  Right MCA/ACA and right ACA punctate infarcts, embolic with unknown source  Resultant  Deficit resolved  MRI / MRA Right MCA/ACA and right ACA punctate infarcts.  CTA neck - negative  Carotid Doppler unremarkable  2D Echo EF 60-65%  Pan CT negative for malignancy  LE venous doppler neg for DVT  Loop recorder interrogation no afib  TEE previous showed PFO  LDL 55  HgbA1c 6.7  VTE prophylaxis - SCDs Diet regular Room service appropriate? Yes; Fluid consistency: Thin  clopidogrel 75 mg daily prior to admission, now on DAPT for 3 months and then plavix alone.  Ongoing aggressive stroke risk factor management  Therapy recommendations: HH PT  Disposition: Pending  Continue to follow up with Dr. Leonie Man at [facility] to discuss wither PFO closure or RESPECT ESUS trial.  He wants earlier appointment with Dr. Leonie Man  History of stroke / TIA  06/18/16 right sided numbness  MRI no infarct, consider TIA  MRA/CUS/TTE, DVT all normal  TEE showed PFO  A1C 7.5 and LDL 52  On plavix at home  Hypertension  Stable Permissive hypertension (OK if < 220/120) but gradually normalize in 5-7 days Long-term BP goal normotensive  Hyperlipidemia  Home meds: Lipitor 40 mg daily - resumed in hospital  LDL 55, goal < 70  Continue statin at discharge  Diabetes  HgbA1c 6.7, goal < 7.0  Controlled  Other Stroke Risk Factors  Advanced age  Hx stroke/TIA  Family hx stroke (mother)  PFO  Other Active Problems  Now interested in [facility] day # 3   Rosalin Hawking, MD PhD Stroke  Neurology 10/29/2016 8:49 PM    To contact Stroke Continuity provider, please refer to [email or web address] After hours, contact General Neurology

## 2016-10-29 NOTE — Progress Notes (Signed)
   10/29/16 1220  PT Time Calculation  PT Start Time (ACUTE ONLY) 1050  PT Stop Time (ACUTE ONLY) 1110  PT Time Calculation (min) (ACUTE ONLY) 20 min  PT Treatments  $Gait Training 8-22 mins  late entry for charges to PT treatment note.

## 2016-10-29 NOTE — Progress Notes (Signed)
Pt transferred from 64M with TELE order active. RN applied TELE box 19 and verified with CCMD staff at this time.   Ave Filter, RN

## 2016-10-29 NOTE — Progress Notes (Signed)
Spoke with supervising PT in regards to patient progress.  PT/PTA in agreement for recommendations to be changed to HHPT with use of RW.  Please refer to treatment note for further information.   Governor Rooks, PTA pager [phone]

## 2016-10-29 NOTE — Progress Notes (Signed)
Occupational Therapy Treatment Patient Details Name: Justin [name] MRN: [id number] DOB: 1941/03/11 Today's Date: 10/29/2016    History of present illness Justin [name] is an 75 y.o. male history of stroke in May 2017, TIA in June 2017, PFO, diabetes mellitus and hypertension, presenting with new onset loss of control of his left lower extremity with weakness and numbness.  CT scan of his head showed old bilateral basal ganglia infarctions, but no acute findings.  Patient was deemed a candidate for TPA because of the significant disability, with inability to bear weight weight and control movement of his left lower extremity acutely.   OT comments  Pt making excellent progress. Feel pt is safe to D/C home @ RW level with intermittent S and follow up with home health OT. Will plan to see in am to compelte education with wife regarding safe D/C plan home.   Follow Up Recommendations  Home health OT;Supervision - Intermittent    Equipment Recommendations  None recommended by OT    Recommendations for Other Services      Precautions / Restrictions Precautions Precautions: Fall       Mobility Bed Mobility                  Transfers Overall transfer level: Needs assistance Equipment used: Rolling walker (2 wheeled) Transfers: Sit to/from Stand Sit to Stand: Supervision              Balance                                   ADL                       Lower Body Dressing: Set up;Supervision/safety   Toilet Transfer: Supervision/safety;RW;Comfort height toilet   Toileting- Clothing Manipulation and Hygiene: Supervision/safety       Functional mobility during ADLs: Supervision/safety;Rolling walker        Vision                     Perception     Praxis      Cognition   Behavior During Therapy: WFL for tasks assessed/performed Overall Cognitive Status: Within Functional Limits for tasks assessed                        Extremity/Trunk Assessment   Min coordination deficits LUE            Exercises Other Exercises Other Exercises: BUE and BLE coordination activities   Shoulder Instructions       General Comments  Pt expressing his concern regarding having another stroke. Educated pt on signs/symptoms of stroke and listened to his concerns.     Pertinent Vitals/ Pain       Pain Assessment: No/denies pain  Home Living                                          Prior Functioning/Environment              Frequency  Min 2X/week        Progress Toward Goals  OT Goals(current goals can now be found in the care plan section)  Progress towards OT goals: Progressing toward goals  Acute Rehab OT Goals Patient Stated Goal: To return  to independent OT Goal Formulation: With patient Time For Goal Achievement: 11/11/16 Potential to Achieve Goals: Good ADL Goals Pt Will Perform Lower Body Bathing: with modified independence;sit to/from stand Pt Will Perform Lower Body Dressing: with modified independence;sit to/from stand Pt Will Transfer to Toilet: with modified independence;ambulating Pt Will Perform Tub/Shower Transfer: with modified independence;Tub transfer;ambulating;tub bench  Plan Discharge plan needs to be updated    Co-evaluation                 End of Session Equipment Utilized During Treatment: Gait belt;Rolling walker   Activity Tolerance Patient tolerated treatment well   Patient Left in chair;with call bell/phone within reach;with family/visitor present   Nurse Communication Mobility status        Time: [id number] OT Time Calculation (min): 19 min  Charges: OT General Charges $OT Visit: 1 Procedure OT Treatments $Self Care/Home Management : 8-22 mins  Rodolfo Notaro,HILLARY 10/29/2016, 4:36 PM   [facility], OTR/L  [phone] 10/29/2016

## 2016-10-29 NOTE — Progress Notes (Signed)
Rehab admissions - Please see therapy notes from today recommending [facility] therapies.  Patient did very well today ambulating up in hallway on unit.  [id number]

## 2016-10-30 DIAGNOSIS — E785 Hyperlipidemia, unspecified: Secondary | ICD-10-CM

## 2016-10-30 LAB — GLUCOSE, CAPILLARY
GLUCOSE-CAPILLARY: 126 mg/dL — AB (ref 65–99)
GLUCOSE-CAPILLARY: 189 mg/dL — AB (ref 65–99)

## 2016-10-30 MED ORDER — ASPIRIN 325 MG PO TABS: 325.0000 mg | ORAL_TABLET | Freq: Every day | ORAL | 0 refills | Status: DC

## 2016-10-30 NOTE — Care Management Note (Signed)
Case Management Note  Patient Details  Name: Justin [name] MRN: [id number] Date of Birth: 02-07-1941  Subjective/Objective:                    Action/Plan: Plan is for patient to discharge home with [facility] services and rolling walker. CM met with the patient and provided him a list of [facility] agencies in the [place] area. He selected [facility]. Mary with [facility] informed of the referral. Brad with [facility] notified of order for walker and he will deliver the equipment to the room. CM will follow up for acceptance of the referral.   Expected Discharge Date:                  Expected Discharge Plan:  [place]  In-House Referral:     Discharge planning Services  CM Consult  Post Acute Care Choice:  Home Health, Durable Medical Equipment Choice offered to:  Patient  DME Arranged:  Walker rolling DME Agency:  [place] Arranged:  PT, OT [place] Agency:  [facility] (now [facility])  Status of Service:  Completed, signed off  If discussed at [place] of Stay Meetings, dates discussed:    Additional Comments:  Pollie Friar, RN 10/30/2016, 12:12 PM

## 2016-10-30 NOTE — Discharge Summary (Signed)
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  Past Medical History:  Diagnosis Date  . Diabetes mellitus without complication ([place])   . GERD (gastroesophageal reflux disease)   . Gout   . Hypertension   . Paroxysmal SVT (supraventricular tachycardia) ([place])    a. s/p RFCA on 05/01/15  . Stroke [facility])    Past Surgical History:  Procedure Laterality Date  . BASAL CELL CARCINOMA EXCISION     off of back  . ELECTROPHYSIOLOGIC STUDY N/A 05/01/2015   Procedure: SVT Ablation;  Surgeon: Evans Lance, MD;  Location: [place] CV LAB;  Service: Cardiovascular;  Laterality: N/A;  . EP IMPLANTABLE DEVICE N/A 06/04/2016   Procedure: Loop Recorder Insertion;  Surgeon: Thompson Grayer, MD;  Location: [place] CV LAB;  Service: Cardiovascular;  Laterality: N/A;  . SQUAMOUS CELL CARCINOMA EXCISION    . TEE WITHOUT CARDIOVERSION N/A 06/04/2016   Procedure: TRANSESOPHAGEAL ECHOCARDIOGRAM (TEE);  Surgeon: Pixie Casino, MD;  Location: [facility] ENDOSCOPY;  Service: Cardiovascular;  Laterality: N/A;      Medication List    STOP taking these medications   ibuprofen 200 MG tablet Commonly known as:  ADVIL,MOTRIN     TAKE these medications   allopurinol 300 MG tablet Commonly known as:  ZYLOPRIM Take 300 mg by mouth.   aspirin 325 MG tablet Take 1 tablet (325 mg total) by mouth daily. Start taking on:  10/31/2016   atorvastatin 40 MG  tablet Commonly known as:  LIPITOR Take 1 tablet (40 mg total) by mouth every morning.   clopidogrel 75 MG tablet Commonly known as:  PLAVIX Take 1 tablet (75 mg total) by mouth daily.   doxycycline 100 MG tablet Commonly known as:  VIBRA-TABS Take 100 mg by mouth 2 (two) times daily. Can not confirm if patient started medication. However, patient did pick up from pharmacy.   fluorouracil 5 % cream Commonly known as:  EFUDEX Apply 1 application topically daily as needed (for skin cancer prevention).   metFORMIN 500 MG tablet Commonly known as:  GLUCOPHAGE Take 2 tablets (1,000 mg total) by mouth 2 (two) times daily with a meal.   nitroGLYCERIN 0.4 MG SL tablet Commonly known as:  NITROSTAT Place 1 tablet (0.4 mg total) under the tongue every 5 (five) minutes as needed for chest pain.   omeprazole 20 MG capsule Commonly known as:  PRILOSEC Take 20 mg by mouth every morning.   telmisartan-hydrochlorothiazide 80-12.5 MG tablet Commonly known as:  MICARDIS HCT TAKE ONE-HALF (1/2) TABLET DAILY (DISCARD REMAINDER AFTER OPENING).   triamcinolone cream 0.1 % Commonly known as:  KENALOG APP EXT AA BID            Durable Medical Equipment        Start     Ordered   10/30/16 1115  For home  use only DME Walker rolling  Once     10/30/16 1114      LABORATORY STUDIES CBC    Component Value Date/Time   WBC 6.5 10/27/2016 0410   RBC 3.66 (L) 10/27/2016 0410   HGB 10.4 (L) 10/27/2016 0410   HCT 32.0 (L) 10/27/2016 0410   PLT 220 10/27/2016 0410   MCV 87.4 10/27/2016 0410   MCH 28.4 10/27/2016 0410   MCHC 32.5 10/27/2016 0410   RDW 16.8 (H) 10/27/2016 0410   LYMPHSABS 1.0 10/27/2016 0410   MONOABS 0.4 10/27/2016 0410   EOSABS 0.1 10/27/2016 0410   BASOSABS 0.0 10/27/2016 0410   CMP    Component Value Date/Time   NA 137 10/27/2016 0410   K 4.2 10/27/2016 0410   CL 105 10/27/2016 0410   CO2 24 10/27/2016 0410   GLUCOSE 127 (H) 10/27/2016 0410   BUN 25 (H)  10/27/2016 0410   CREATININE 1.00 10/27/2016 0410   CALCIUM 8.6 (L) 10/27/2016 0410   PROT 7.7 10/25/2016 2211   ALBUMIN 3.2 (L) 10/25/2016 2211   AST 18 10/25/2016 2211   ALT 7 (L) 10/25/2016 2211   ALKPHOS 51 10/25/2016 2211   BILITOT 0.5 10/25/2016 2211   GFRNONAA >60 10/27/2016 0410   GFRAA >60 10/27/2016 0410   COAGS Lab Results  Component Value Date   INR 1.03 10/25/2016   INR 1.03 06/01/2016   INR 1.1 (H) 04/24/2015   Lipid Panel    Component Value Date/Time   CHOL 100 10/26/2016 0423   TRIG 74 10/26/2016 0423   HDL 30 (L) 10/26/2016 0423   CHOLHDL 3.3 10/26/2016 0423   VLDL 15 10/26/2016 0423   LDLCALC 55 10/26/2016 0423   HgbA1C  Lab Results  Component Value Date   HGBA1C 6.7 (H) 10/26/2016   Cardiac Panel (last 3 results) No results for input(s): CKTOTAL, CKMB, TROPONINI, RELINDX in the last 72 hours. Urinalysis    Component Value Date/Time   COLORURINE YELLOW 10/25/2016 2212   APPEARANCEUR CLEAR 10/25/2016 2212   LABSPEC 1.008 10/25/2016 2212   PHURINE 5.5 10/25/2016 2212   GLUCOSEU NEGATIVE 10/25/2016 2212   HGBUR NEGATIVE 10/25/2016 2212   BILIRUBINUR NEGATIVE 10/25/2016 2212   KETONESUR NEGATIVE 10/25/2016 2212   PROTEINUR NEGATIVE 10/25/2016 2212   NITRITE NEGATIVE 10/25/2016 2212   LEUKOCYTESUR NEGATIVE 10/25/2016 2212   Urine Drug Screen     Component Value Date/Time   LABOPIA NONE DETECTED 10/25/2016 2255   COCAINSCRNUR NONE DETECTED 10/25/2016 2255   LABBENZ NONE DETECTED 10/25/2016 2255   AMPHETMU NONE DETECTED 10/25/2016 2255   THCU NONE DETECTED 10/25/2016 2255   LABBARB NONE DETECTED 10/25/2016 2255    Alcohol Level    Component Value Date/Time   ETH <5 10/25/2016 2211     SIGNIFICANT DIAGNOSTIC STUDIES  Ct Head Code Stroke W/o Cm 10/25/2016 1. No acute intracranial process. Stable examination including multiple old basal ganglia infarcts and moderate to severe chronic small vessel ischemic disease.  2. ASPECTS score is  10.   MRI / MRA 10/26/16 1. Patchy small volume acute ischemic infarcts involving the posterior right centrum semi ovale and parasagittal right parietal cortex. No associated hemorrhage or mass effect.  2. No other acute intracranial process identified. 3. Moderate chronic microvascular ischemic disease. 4. Normal MRA of the circle of Willis and intracranial arteries.  Carotid Duplex (Doppler)  Findings suggest 1-39% internal carotid artery stenosis bilaterally. Vertebral arteries are patent with antegrade flow.  Bilateral lower extremity venous duplex  Bilateral  lower extremities are negativefor deep vein thrombosis. There is no evidence of Baker's cyst bilaterally.  TTE - Left ventricle: The cavity size was normal. Wall thickness was normal. Systolic function was normal. The estimated ejection fraction was in the range of 60% to 65%. Wall motion was normal; there were no regional wall motion abnormalities. Doppler parameters are consistent with abnormal left ventricular relaxation (grade 1 diastolic dysfunction). - Mitral valve: Calcified annulus. Impressions: - No cardiac source of emboli was indentified.  Pan CT 10/28/2016 IMPRESSION: 1. No acute abnormality. 2. No significant change in an 8.8 cm Bosniak category 35F upper pole right renal cyst. A follow up abdomen CT with contrast is recommended in 1 year. 3. Moderately to markedly enlarged prostate gland. 4. Coronary artery atherosclerosis and aortic atherosclerosis.   Mr Lumbar Spine W Wo Contrast 10/28/2016 IMPRESSION: 1. No explanation for left lower extremity symptoms. 2. Noncompressive degenerative changes are described above. 3. 21 mm subcutaneous mass over the left iliac crest that was not seen on 09/06/2015 abdominal CT. Lack of internal enhancement and surrounding soft tissue stranding favors contusion/hematoma. If no corroborating history, could follow-up with sonography in 3-6 months.   Ct Angio Neck  W Or Wo Contrast 10/29/2016 IMPRESSION: 1. Mild atherosclerotic changes at the carotid bifurcations bilaterally, left greater than right without significant stenosis on either side. 2. Mild narrowing of the vertebral arteries at the dural margin bilaterally without a significant stenosis of greater than 50% relative to the more distal vessel. 3. Mild atherosclerotic disease at the aortic arch. 4. Multilevel degenerate changes in the cervical spine.     HISTORY OF PRESENT ILLNESS Justin [name] is an 75 y.o. male history of stroke in May 2017, TIA in June 2017, PFO, diabetes mellitus and hypertension, presenting with new onset loss of control of his left lower extremity with weakness and numbness. Onset was at 8:30 PM tonight. He's been taking Plavix 75 mg per day. CT scan of his head showed old bilateral basal ganglia infarctions, but no acute findings. NIH stroke score was 3. Patient was deemed a candidate for TPA because of the significant disability, with inability to bear weight weight and control movement of his left lower extremity acutely. LSN: 8:30 PM on 10/25/2016 tPA Given: Yes mRankin:   HOSPITAL COURSE [name] is a 75 y.o. male with history of previous strokes/TIA, PFO, paroxysmal supraventricular tachycardia, hypertension, diabetes mellitus, and gout presenting with left lower extremity weakness and numbness. He did receive IV t-PA. His symptoms recovered well. Loop no afib. For cryptogenic stroke, we did further stroke work up (see below) but all negative. He is discharged to home with home health in good condition. He is going to follow up with Dr. Leonie Man ASAP for further stroke prevention plans.   Stroke:  Right MCA/ACA and right ACA punctate infarcts, embolic with unknown source  Resultant  Deficit resolved  MRI / MRA Right MCA/ACA and right ACA punctate infarcts.  CTA neck - negative  Carotid Doppler unremarkable  2D Echo EF 60-65%  Pan CT negative for  malignancy  LE venous doppler neg for DVT  Loop recorder interrogation no afib  TEE previous showed PFO  LDL 55  HgbA1c 6.7  VTE prophylaxis - SCDs  Diet regular Room service appropriate? Yes; Fluid consistency: Thin  clopidogrel 75 mg daily prior to admission, now on DAPT for 3 months and then plavix alone.  Ongoing aggressive stroke risk factor management  Therapy recommendations: HH PT  Disposition: Pending  Continue  to follow up with Dr. Leonie Man at [facility] to discuss either PFO closure or RESPECT ESUS trial. He wants earlier appointment with Dr. Leonie Man.  History of stroke / TIA  06/18/16 right sided numbness  MRI no infarct, consider TIA  MRA/CUS/TTE, DVT all normal  TEE showed PFO  A1C 7.5 and LDL 52  On plavix at home  Hypertension  Stable  Permissive hypertension (OK if < 220/120) but gradually normalize in 5-7 days  Long-term BP goal normotensive  Hyperlipidemia  Home meds: Lipitor 40 mg daily - resumed in hospital  LDL 55, goal < 70  Continue statin at discharge  Diabetes  HgbA1c 6.7, goal < 7.0  Controlled  Other Stroke Risk Factors  Advanced age  Hx stroke/TIA  Family hx stroke (mother)  PFO  Other Active Problems  Now interested in RESPECT ESUS trial  DISCHARGE EXAM Blood pressure 105/61, pulse 70, temperature 97.6 F (36.4 C), temperature source Oral, resp. rate 18, height 5\' 9"  (1.753 m), weight 165 lb (74.8 kg), SpO2 96 %.  General - Well nourished, well developed, in no apparent distress.  Ophthalmologic - Fundi not visualized due to small pupils.  Cardiovascular - Regular rate and rhythm.  Mental Status -  Level of arousal and orientation to time, place, and person were intact. Language including expression, naming, repetition, comprehension was assessed and found intact. Fund of Knowledge was assessed and was intact.  Cranial Nerves II - XII - II - Visual field intact OU. III, IV, VI - Extraocular  movements intact. V - Facial sensation intact bilaterally. VII - Facial movement intact bilaterally. VIII - Hearing & vestibular intact bilaterally. X - Palate elevates symmetrically. XI - Chin turning & shoulder shrug intact bilaterally. XII - Tongue protrusion intact.  Motor Strength - The patient's strength was normal in all extremities and pronator drift was absent.  Bulk was normal and fasciculations were absent.   Motor Tone - Muscle tone was assessed at the neck and appendages and was normal.  Reflexes - The patient's reflexes were 1+ in all extremities and he had no pathological reflexes.  Sensory - Light touch, temperature/pinprick were assessed and were symmetrical.    Coordination - The patient had normal movements in the hands with no ataxia or dysmetria.  Tremor was absent.  Gait and Station - deferred.  Discharge Diet   Diet regular Room service appropriate? Yes; Fluid consistency: Thin Diet - low sodium heart healthy liquids  DISCHARGE PLAN  Disposition:  Home with [facility] PT/OT  aspirin 325 mg daily and clopidogrel 75 mg daily for secondary stroke prevention.  Ongoing risk factor control by Primary Care Physician at time of discharge  Follow-up SHAW,KIMBERLEE, MD in 2 weeks.  Follow-up with Dr. Antony Contras ASAP office to schedule an appointment.  35 minutes were spent preparing discharge.  Rosalin Hawking, MD PhD Stroke Neurology 10/30/2016 1:43 PM

## 2016-10-30 NOTE — Care Management Important Message (Signed)
Important Message  Patient Details  Name: Justin [name] MRN: [id number] Date of Birth: [birth year]   Medicare Important Message Given:  Yes    [name] 10/30/2016, 11:26 AM

## 2016-10-30 NOTE — Progress Notes (Signed)
Physical Therapy Treatment Patient Details Name: Justin [name] MRN: [id number] DOB: 06-13-1941 Today's Date: 10/30/2016    History of Present Illness Justin [name] is an 75 y.o. male history of stroke in May 2017, TIA in June 2017, PFO, diabetes mellitus and hypertension, presenting with new onset loss of control of his left lower extremity with weakness and numbness.  CT scan of his head showed old bilateral basal ganglia infarctions, but no acute findings.  Patient was deemed a candidate for TPA because of the significant disability, with inability to bear weight weight and control movement of his left lower extremity acutely.    PT Comments    Pt asking questions about where to purchase cane and d/c plan.  Educated on where to purchase cane and discussed PT recommendations for follow up services.  Feel pt would benefit from OPPT if transportation is available to get him to appointments.  Otherwise recommend HHPT with transition to OP if recommended by HHPT.   Follow Up Recommendations  Home health PT;Outpatient PT;Supervision for mobility/OOB     Equipment Recommendations  Rolling walker with 5" wheels    Recommendations for Other Services       Precautions / Restrictions Precautions Precautions: Fall Precaution Comments: L sided weakness Restrictions Weight Bearing Restrictions: No    Mobility  Bed Mobility                  Transfers Overall transfer level: Modified independent Equipment used: Rolling walker (2 wheeled) Transfers: Sit to/from Stand Sit to Stand: Modified independent (Device/Increase time)            Ambulation/Gait Ambulation/Gait assistance: Modified independent (Device/Increase time) Ambulation Distance (Feet): 500 Feet Assistive device: Rolling walker (2 wheeled) Gait Pattern/deviations:  (L knee recurvatum)   Gait velocity interpretation: Below normal speed for age/gender General Gait Details: pt at this time reports he isn't ready  to amb without RW and respectfully declined practice   Stairs            Wheelchair Mobility    Modified Rankin (Stroke Patients Only)       Balance     Sitting balance-Leahy Scale: Good     Standing balance support: Bilateral upper extremity supported;During functional activity Standing balance-Leahy Scale: Fair                      Cognition Arousal/Alertness: Awake/alert Behavior During Therapy: WFL for tasks assessed/performed Overall Cognitive Status: Within Functional Limits for tasks assessed       Memory: Decreased short-term memory (OT discussed OP therapies yesterday-pt without recollection)              Exercises      General Comments        Pertinent Vitals/Pain Pain Assessment: No/denies pain    Home Living                      Prior Function            PT Goals (current goals can now be found in the care plan section) Acute Rehab PT Goals Patient Stated Goal: To return to independent PT Goal Formulation: With patient Time For Goal Achievement: 11/02/16 Potential to Achieve Goals: Good Progress towards PT goals: Progressing toward goals    Frequency    Min 4X/week      PT Plan Current plan remains appropriate;Other (comment)    Co-evaluation  End of Session Equipment Utilized During Treatment: Gait belt Activity Tolerance: Patient tolerated treatment well Patient left: in chair;with call bell/phone within reach     Time: 1052-1116 PT Time Calculation (min) (ACUTE ONLY): 24 min  Charges:  $Gait Training: 23-37 mins                    G Codes:         Laureen Abrahams, PT, DPT 10/30/16 11:34 AM [phone]

## 2016-10-30 NOTE — Consult Note (Signed)
   [facility] Inpatient Consult   10/30/2016  Justin [name] [birth year] [id number]   Patient screened for potential [place] Management services. Patient is eligible for [facility] Care Management services under patient's Medicare  plan. Patient will be followed up with EMMI post hosptial calls per referral and Home health.  No community [facility] needs noted at this time.  For questions contact:   Natividad Brood, RN BSN [facility] Liaison  [phone] business mobile phone Toll free office [phone]

## 2016-10-30 NOTE — Progress Notes (Signed)
Occupational Therapy Treatment Patient Details Name: Justin [name] MRN: [id number] DOB: 1941-09-28 Today's Date: 10/30/2016    History of present illness Justin [name] is an 75 y.o. male history of stroke in May 2017, TIA in June 2017, PFO, diabetes mellitus and hypertension, presenting with new onset loss of control of his left lower extremity with weakness and numbness.  CT scan of his head showed old bilateral basal ganglia infarctions, but no acute findings.  Patient was deemed a candidate for TPA because of the significant disability, with inability to bear weight weight and control movement of his left lower extremity acutely.   OT comments  Completed education regarding home safety, reducing risk of falls and HEP. Pt asking about returning to driving. Pt does not appear to have deficits which would affect his ability to reurn to driving at this time. Recommended that he discuss this with his physician. Will continue to follow acutely.  Follow Up Recommendations  Home health OT;Supervision - Intermittent    Equipment Recommendations  None recommended by OT    Recommendations for Other Services      Precautions / Restrictions Precautions Precautions: Fall Precaution Comments: L sided weakness Restrictions Weight Bearing Restrictions: No       Mobility Bed Mobility    up in  chair              Transfers Overall transfer level: Modified independent Equipment used: Rolling walker (2 wheeled) Transfers: Sit to/from Stand Sit to Stand: Modified independent (Device/Increase time)              Balance     Sitting balance-Leahy Scale: Good     Standing balance support: Bilateral upper extremity supported;During functional activity Standing balance-Leahy Scale: Fair                     ADL                                         General ADL Comments: Completed education regarding home safety and reducing risk of falls. Written  information given. Reviewed safe shower transfer technique. Pt appears somewhat anxious.       Vision      wears glasses               Perception     Praxis      Cognition   Behavior During Therapy: WFL for tasks assessed/performed Overall Cognitive Status: Within Functional Limits for tasks assessed       Memory: Decreased short-term memory               Extremity/Trunk Assessment   LUE improving. Using functionally without difficulty. Has greater difficulty with LLE            Exercises Other Exercises Other Exercises: BU/LE gross motor coordination   Shoulder Instructions       General Comments      Pertinent Vitals/ Pain       Pain Assessment: No/denies pain  Home Living                                          Prior Functioning/Environment              Frequency  Min 2X/week  Progress Toward Goals  OT Goals(current goals can now be found in the care plan section)  Progress towards OT goals: Progressing toward goals  Acute Rehab OT Goals Patient Stated Goal: To return to independent OT Goal Formulation: With patient Time For Goal Achievement: 11/11/16 Potential to Achieve Goals: Good ADL Goals Pt Will Perform Lower Body Bathing: with modified independence;sit to/from stand Pt Will Perform Lower Body Dressing: with modified independence;sit to/from stand Pt Will Transfer to Toilet: with modified independence;ambulating Pt Will Perform Tub/Shower Transfer: with modified independence;Tub transfer;ambulating;tub bench  Plan Discharge plan needs to be updated    Co-evaluation                 End of Session     Activity Tolerance Patient tolerated treatment well   Patient Left in chair;with call bell/phone within reach;with family/visitor present   Nurse Communication Mobility status (pt's DC concerns)        Time: 1125-1140 OT Time Calculation (min): 15 min  Charges: OT General  Charges $OT Visit: 1 Procedure OT Treatments $Self Care/Home Management : 8-22 mins  Jeweldean Drohan,HILLARY 10/30/2016, 11:44 AM   Maurie Boettcher, OTR/L  [phone] 10/30/2016

## 2016-10-31 ENCOUNTER — Encounter: Payer: Medicare Other | Admitting: Hematology

## 2016-10-31 DIAGNOSIS — I471 Supraventricular tachycardia: Secondary | ICD-10-CM | POA: Diagnosis not present

## 2016-10-31 DIAGNOSIS — I69393 Ataxia following cerebral infarction: Secondary | ICD-10-CM | POA: Diagnosis not present

## 2016-10-31 DIAGNOSIS — Z7902 Long term (current) use of antithrombotics/antiplatelets: Secondary | ICD-10-CM | POA: Diagnosis not present

## 2016-10-31 DIAGNOSIS — I10 Essential (primary) hypertension: Secondary | ICD-10-CM | POA: Diagnosis not present

## 2016-10-31 DIAGNOSIS — E119 Type 2 diabetes mellitus without complications: Secondary | ICD-10-CM | POA: Diagnosis not present

## 2016-10-31 DIAGNOSIS — M109 Gout, unspecified: Secondary | ICD-10-CM | POA: Diagnosis not present

## 2016-11-01 ENCOUNTER — Ambulatory Visit (INDEPENDENT_AMBULATORY_CARE_PROVIDER_SITE_OTHER): Payer: Medicare Other | Admitting: *Deleted

## 2016-11-01 DIAGNOSIS — Z7902 Long term (current) use of antithrombotics/antiplatelets: Secondary | ICD-10-CM | POA: Diagnosis not present

## 2016-11-01 DIAGNOSIS — I639 Cerebral infarction, unspecified: Secondary | ICD-10-CM | POA: Diagnosis not present

## 2016-11-01 DIAGNOSIS — M109 Gout, unspecified: Secondary | ICD-10-CM | POA: Diagnosis not present

## 2016-11-01 DIAGNOSIS — I69393 Ataxia following cerebral infarction: Secondary | ICD-10-CM | POA: Diagnosis not present

## 2016-11-01 DIAGNOSIS — E119 Type 2 diabetes mellitus without complications: Secondary | ICD-10-CM | POA: Diagnosis not present

## 2016-11-01 DIAGNOSIS — I10 Essential (primary) hypertension: Secondary | ICD-10-CM | POA: Diagnosis not present

## 2016-11-01 DIAGNOSIS — I471 Supraventricular tachycardia: Secondary | ICD-10-CM | POA: Diagnosis not present

## 2016-11-03 LAB — CUP PACEART REMOTE DEVICE CHECK
Implantable Pulse Generator Implant Date: 20170613
MDC IDC SESS DTM: 20171011233843

## 2016-11-03 NOTE — Progress Notes (Signed)
Carelink summary report received. Battery status OK. Normal device function. No new symptom episodes, tachy episodes, brady, or pause episodes. No new AF episodes. Monthly summary reports and ROV/PRN 

## 2016-11-04 NOTE — Progress Notes (Signed)
Carelink Summary Report / Loop Recorder 

## 2016-11-05 ENCOUNTER — Other Ambulatory Visit: Payer: Self-pay

## 2016-11-05 ENCOUNTER — Telehealth: Payer: Self-pay | Admitting: Neurology

## 2016-11-05 DIAGNOSIS — E119 Type 2 diabetes mellitus without complications: Secondary | ICD-10-CM | POA: Diagnosis not present

## 2016-11-05 DIAGNOSIS — I471 Supraventricular tachycardia: Secondary | ICD-10-CM | POA: Diagnosis not present

## 2016-11-05 DIAGNOSIS — I69393 Ataxia following cerebral infarction: Secondary | ICD-10-CM | POA: Diagnosis not present

## 2016-11-05 DIAGNOSIS — M109 Gout, unspecified: Secondary | ICD-10-CM | POA: Diagnosis not present

## 2016-11-05 DIAGNOSIS — I10 Essential (primary) hypertension: Secondary | ICD-10-CM | POA: Diagnosis not present

## 2016-11-05 DIAGNOSIS — Z7902 Long term (current) use of antithrombotics/antiplatelets: Secondary | ICD-10-CM | POA: Diagnosis not present

## 2016-11-05 NOTE — Telephone Encounter (Signed)
Ok thanks 

## 2016-11-05 NOTE — Telephone Encounter (Signed)
Crystal/ [facility] called to schedule hospital f/u appt. The discharge papers say to f/u with [facility] and he does not have any openings. Can a work in be used? May call [place] at [phone]

## 2016-11-05 NOTE — Telephone Encounter (Signed)
Rn call Crystal at [facility], stating can have an appt at 0130pm.   LEft vm for Crystal that appt time will have to be 1200pm with Dr.Sethi on 11/11/2016 not 0130pm.because appt was already filled by another pt.

## 2016-11-05 NOTE — Patient Outreach (Signed)
[facility] Care Management  11/05/2016  Justin [name] 1941-05-21 [id number]   [place] [facility] Care Coordination Services  Issue:  [facility] Follow-up appt needed. Dr. Suzzanne Cloud [facility]  [address] [place], [place] [address] [phone] H/o Primary MD: Dr. Serita Grammes did not want to see patient for [facility] follow-up appointment and recommended completing post hospital follow-up with Neurologist, Dr. Leonie Man. RN CM will contact Neurologist to schedule patient appt.   Contact states will discuss with nurse and provide update back to RN CM.     Nathaneil Canary, BSN, RN, [place] Care Management Care Management Coordinator [phone] Direct [phone] Cell [phone] Office [phone] Fax [email or web address]

## 2016-11-05 NOTE — Patient Outreach (Signed)
[facility]) Care Management  11/05/2016  Justin [name] 1941-02-23 [id number]   Telephonic [facility] Stroke  Care Coordination Services  Contact call to patient to provide Neurology appt information:   Monday, November 20th, 2017 at 1:30 (Arrive at 1:00 - bring insurance copy and co-pay per office instructions.   Dr. Suzzanne Cloud [facility]  [address], [address] [place], [place] [address] [phone]  Patient verbalized understanding of appt and MD's contact information.   Nathaneil Canary, BSN, RN, [place] Management Care Management Coordinator [phone] Direct [phone] Cell [phone] Office [phone] Fax [email or web address]

## 2016-11-05 NOTE — Telephone Encounter (Signed)
Rn spoke with Teacher, music at [facility]. Rn stated pt appt will have to be at 1200pm. Rn stated he needs to check in at 1115. Crystal stated she will call the patient back to give new appt time for 11/11/2016.

## 2016-11-05 NOTE — Patient Outreach (Signed)
[facility]) Care Management  11/05/2016  Huriel Porter [birth year] [id number]   Update received back from MD office: Dr. Suzzanne Cloud [facility]  [address], [address] [place], [place] [address] [phone]  Monday, November 20th, 2017 at 1:30 (Arrive at 1:00 - bring insurance copy and co-pay per office instructions.   Plan:  RN CM will notify patient of this update.   Nathaneil Canary, BSN, RN, [place] Management Care Management Coordinator [phone] Direct [phone] Cell [phone] Office [phone] Fax [email or web address]

## 2016-11-05 NOTE — Patient Outreach (Addendum)
[facility]) Care Management  11/05/2016  Justin [name] Jun 03, 1941 [id number]   [place] Stroke Program   Referral: 11/04/2016 Issue:  Red Alert:  Patient has not made follow-up appt.   Providers: Primary MD: Dr. Serita Grammes - Patient states Dr. Brigitte Pulse  did not want to see patient for [facility] follow-up appointment and recommended completing post hospital follow-up with Neurologist, Dr. Leonie Man. Neurologist: Dr. Leonie Man HH: [facility] - PT/OT services   Psycho/Social: Patient lives in his home.  Mobility: Ambulating with Rolling Walker and cane.  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   Medications:  Patient taking less than 15 medications  Co-pay cost issues: none  Medication Reconciliation completed with patient 11/05/2016 Patient states he added Regular ASA 325 mg tablet daily in addition to Plavix as directed.    Encounter Medications:  Outpatient Encounter Prescriptions as of 11/05/2016  Medication Sig Note  . allopurinol (ZYLOPRIM) 300 MG tablet Take 300 mg by mouth. 09/03/2016: Received from: [facility] Received Sig: Take 300 mg by mouth daily.  [name] Kitchen aspirin 325 MG tablet Take 1 tablet (325 mg total) by mouth daily.   [name] Kitchen atorvastatin (LIPITOR) 40 MG tablet Take 1 tablet (40 mg total) by mouth every morning. 10/26/2016: Filled with [facility]; 09-04-16-90 day supply  . clopidogrel (PLAVIX) 75 MG tablet Take 1 tablet (75 mg total) by mouth daily.   [name] Kitchen  doxycycline (VIBRA-TABS) 100 MG tablet Take 100 mg by mouth 2 (two) times daily. Can not confirm if patient started medication. However, patient did pick up from pharmacy. 10/26/2016: For 10 days, started on 10-20-16  . fluorouracil (EFUDEX) 5 % cream Apply 1 application topically daily as needed (for skin cancer prevention).    . nitroGLYCERIN (NITROSTAT) 0.4 MG SL tablet Place 1 tablet (0.4 mg total) under the tongue every 5 (five) minutes as needed for chest pain. 10/26/2016: ----  . omeprazole (PRILOSEC) 20 MG capsule Take 20 mg by mouth every morning.    . triamcinolone cream (KENALOG) 0.1 % APP EXT AA BID 09/03/2016: Received from: External Pharmacy  . metFORMIN (GLUCOPHAGE) 500 MG tablet Take 2 tablets (1,000 mg total) by mouth 2 (two) times daily with a meal. (Patient not taking: Reported on 11/05/2016) 10/26/2016: Medication has not been filled at [facility] pharmacy since jan 2017.   . telmisartan-hydrochlorothiazide (MICARDIS HCT) 80-12.5 MG tablet TAKE ONE-HALF (1/2) TABLET DAILY (DISCARD REMAINDER AFTER OPENING). (Patient not taking: Reported on 11/05/2016) 10/26/2016: Last fill date; Feb 2017   No facility-administered encounter medications on file as of 11/05/2016.     Functional Status:  In your present state of health, do you have any difficulty performing the following activities: 11/05/2016 10/26/2016  Hearing? N N  Vision? N N  Difficulty concentrating or making decisions? N N  Walking or climbing stairs? Y N  Dressing or bathing? N N  Doing errands, shopping? Y N  Preparing Food and eating ? N -  Using the Toilet? N -  In the past six months,  have you accidently leaked urine? N -  Do you have problems with loss of bowel control? N -  Managing your Medications? N -  Managing your Finances? N -  Housekeeping or managing your Housekeeping? N -  Some recent data might be hidden    Fall/Depression Screening: PHQ 2/9 Scores 11/05/2016  PHQ - 2 Score 0    Fall Risk   11/05/2016 09/03/2016  Falls in the past year? No No  Risk for fall due to : Impaired balance/gait;Impaired mobility -   Plan:  Referral Date:  11/04/2016 [facility]roke Program:  11/05/2016 Patient eligible for [facility]   Yes   Follow-up appt needed. RN CM will contact Neurologist to schedule patient appt.   Dr. Suzzanne Cloud [facility]  [address] [place], [place] [address] [phone]  RN CM advised in next [facility] scheduled contact call within one week for [facility] Stroke Program Services  RN CM advised to please notify MD of any changes in condition prior to scheduled appt's.   RN CM provided contact name and # [phone] or main office # [phone] and 24-hour nurse line # [phone].  RN CM confirmed patient is aware of 911 services for urgent emergency needs.  RN CM notified [place] Management Assistant: agreed to services/case opened.  Nathaneil Canary, BSN, RN, [place] Management Care Management Coordinator [phone] Direct [phone] Cell [phone] Office [phone] Fax [email or web address]

## 2016-11-08 ENCOUNTER — Other Ambulatory Visit: Payer: Self-pay

## 2016-11-08 DIAGNOSIS — I69393 Ataxia following cerebral infarction: Secondary | ICD-10-CM | POA: Diagnosis not present

## 2016-11-08 DIAGNOSIS — Z7902 Long term (current) use of antithrombotics/antiplatelets: Secondary | ICD-10-CM | POA: Diagnosis not present

## 2016-11-08 DIAGNOSIS — I471 Supraventricular tachycardia: Secondary | ICD-10-CM | POA: Diagnosis not present

## 2016-11-08 DIAGNOSIS — I10 Essential (primary) hypertension: Secondary | ICD-10-CM | POA: Diagnosis not present

## 2016-11-08 DIAGNOSIS — E119 Type 2 diabetes mellitus without complications: Secondary | ICD-10-CM | POA: Diagnosis not present

## 2016-11-08 DIAGNOSIS — M109 Gout, unspecified: Secondary | ICD-10-CM | POA: Diagnosis not present

## 2016-11-08 NOTE — Patient Outreach (Signed)
[facility]) Care Management  11/08/2016  Justin [name] [birth year] [id number]    Telephonic Care Coordination Services  Contact call to patient to provide update on MD appt time change.  Check in time:  11:15 Appointment time 12:00  Dr. Suzzanne Cloud [facility]  [address], [address] [place], [place] [address] [phone]  Lucresha Dismuke, MSHL, BSN, RN, [place] Care Management Care Management Coordinator [phone] Direct [phone] Cell [phone] Office [phone] Fax [email or web address]

## 2016-11-11 ENCOUNTER — Encounter: Payer: Self-pay | Admitting: Neurology

## 2016-11-11 ENCOUNTER — Ambulatory Visit (INDEPENDENT_AMBULATORY_CARE_PROVIDER_SITE_OTHER): Payer: Medicare Other | Admitting: Neurology

## 2016-11-11 VITALS — BP 120/74 | HR 68 | Ht 69.0 in | Wt 158.8 lb

## 2016-11-11 DIAGNOSIS — I471 Supraventricular tachycardia: Secondary | ICD-10-CM | POA: Diagnosis not present

## 2016-11-11 DIAGNOSIS — I69393 Ataxia following cerebral infarction: Secondary | ICD-10-CM | POA: Diagnosis not present

## 2016-11-11 DIAGNOSIS — I639 Cerebral infarction, unspecified: Secondary | ICD-10-CM

## 2016-11-11 DIAGNOSIS — M109 Gout, unspecified: Secondary | ICD-10-CM | POA: Diagnosis not present

## 2016-11-11 DIAGNOSIS — I10 Essential (primary) hypertension: Secondary | ICD-10-CM | POA: Diagnosis not present

## 2016-11-11 DIAGNOSIS — Z7902 Long term (current) use of antithrombotics/antiplatelets: Secondary | ICD-10-CM | POA: Diagnosis not present

## 2016-11-11 DIAGNOSIS — E119 Type 2 diabetes mellitus without complications: Secondary | ICD-10-CM | POA: Diagnosis not present

## 2016-11-11 DIAGNOSIS — I6381 Other cerebral infarction due to occlusion or stenosis of small artery: Secondary | ICD-10-CM

## 2016-11-11 NOTE — Progress Notes (Signed)
[facility] [address] [place]. [place] [address] [phone]       OFFICE FOLLOW-UP NOTE  [name] Date of Birth:  [birth year] Medical Record Number:  [id number]   HPI:   Office visit 09/03/2016 : 75 year old male seen today for first office follow-up visit for hospital admission for TIA in June 2017 [name] is a 75 y.o. male with a history of SVT who presented to Hospital in [facility] 2 weeks ago with left arm and facial numbness. MRI revealed 2 tiny areas of infarct in the same distribution. I'm not sure what workup was done at that time, but he did have a CTA head and neck as well as MRI. Oncology 06/01/16, he had sudden onset right arm and face numbness. It resolved over the course of approximately 30 minutes. He states that this was similar to the episode that he had prior, except that is on the opposite side. It is completely resolved at this point. He denies any difficulty with speech, or weakness. LKW: 1630 on 06/01/16 tpa given?: no, resolved symptoms . MRI scan the brain showed no acute process but showed old bilateral basal ganglia lacunar infarcts and moderate changes of chronic small vessel disease. 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 MRA of the brain showed no large vessel occlusion or stenosis. Carotid Doppler showed no significant extracranial stenosis. Transthoracic echo and transesophageal echo both did not show any cardiac source of embolism. TEE showed a small PFO with bidirectional shunt fissures not felt to be of clinical significance LDL cholesterol 52 mg percent. Hemoglobin A1c was elevated at 7.5. Patient was previously on aspirin which was switched to Plavix for stroke prevention. He states his done well. He states that  2 weeks prior to his admission to [facility] for TIA he was admitted in [facility] with the right MCA branch  embolic strokes following which was started on aspirin. He is now tolerating Plavix well without bleeding or bruising. He is also on Lipitor which is tolerating well without muscle aches and pains. He states his blood pressure is well controlled and today it is 110/70. He states his fasting sugars have been better now and he plans to repeat hemoglobin A1c next week. He has no new complaints today. He had a loop recorder inserted on 6/13 117 and so for paroxysmal atrial fibrillation has not yet been to detected. Update 11/11/2016 ;  patient is worked in urgently to is seen following recent hospitalization for recurrent stroke. He was readmitted on 11/ 3/17 with   new onset loss of control of his left lower extremity with weakness and numbness. Onset was at 8:30 PM tonight. He's been taking Plavix 75 mg per day. CT scan of his head showed old bilateral basal ganglia infarctions, but no acute findings. NIH stroke score was 3. Patient was deemed a candidate for TPA because of the significant disability, with inability to bear weight weight and control movement of his left lower extremity acutely. LSN:8:30 PM on 10/25/2016 tPA Given:Yes. Patient was admitted to the neuro ICU and closely monitored with strict blood pressure control. Symptoms resolved quickly after TPA. MRI scan showed punctate white matter infarcts in the right parietal and frontal white matter. Carotid Doppler showed no significant expectoration stenosis. Bilateral lower extremity venous Dopplers were negative for DVT. Transthoracic echo showed normal ejection fraction. Patient had a CT scan of the chest abdomen and  pelvis to look for malignancy which wasn't negative. MRI of the lumbar spine showed no significant degenerative changes. CT) of the neck showed no significant large vessel stenosis or occlusion. Patient had a loop recorder implanted in the prior admission which was interrogated and did not show paroxysmal A. fib. TEE was not repeated as  done in the previous admission had shown a PFO which was small. LDL cholesterol was 55 mg percent and hemoglobin A1c was 6.7. Patient was on Plavix prior to admission and aspirin was added with plan for dual antiplatelet therapy for 3 months followed by Plavix alone. Patient did not show interest in either endovascular PFO closure on participation in the [place] trial. He states his done well since discharge his leg weakness and walking are much better. Is getting home physical and occupational therapy. Is tolerating aspirin and Plavix without significant bruising or bleeding. His blood pressure is well controlled and today it is 120/time foot. He is tolerating his statin well without muscle aches or pains. His fasting sugars are pretty good and below 100. In fact he  has no complaints ROS:   14 system review of systems is positive for  leg weakness, walking difficulty and all other systems negative  PMH:  Past Medical History:  Diagnosis Date  . Diabetes mellitus without complication ([place])   . GERD (gastroesophageal reflux disease)   . Gout   . Hypertension   . Paroxysmal SVT (supraventricular tachycardia) ([place])    a. s/p RFCA on 05/01/15  . Stroke [facility])     Social History:  Social History   Social History  . Marital status: Married    Spouse name: N/A  . Number of children: N/A  . Years of education: N/A   Occupational History  . Not on file.   Social History Main Topics  . Smoking status: Never Smoker  . Smokeless tobacco: Never Used  . Alcohol use No  . Drug use: No  . Sexual activity: Not on file   Other Topics Concern  . Not on file   Social History Narrative  . No narrative on file    Medications:   Current Outpatient Prescriptions on File Prior to Visit  Medication Sig Dispense Refill  . allopurinol (ZYLOPRIM) 300 MG tablet Take 300 mg by mouth.    [name] Kitchen aspirin 325 MG tablet Take 1 tablet (325 mg total) by mouth daily. 90 tablet 0  . atorvastatin (LIPITOR) 40 MG  tablet Take 1 tablet (40 mg total) by mouth every morning. 90 tablet 3  . clopidogrel (PLAVIX) 75 MG tablet Take 1 tablet (75 mg total) by mouth daily. 90 tablet 3  . fluorouracil (EFUDEX) 5 % cream Apply 1 application topically daily as needed (for skin cancer prevention).   0  . metFORMIN (GLUCOPHAGE) 500 MG tablet Take 2 tablets (1,000 mg total) by mouth 2 (two) times daily with a meal. (Patient taking differently: Take by mouth daily with breakfast. ) 60 tablet 3  . nitroGLYCERIN (NITROSTAT) 0.4 MG SL tablet Place 1 tablet (0.4 mg total) under the tongue every 5 (five) minutes as needed for chest pain. 25 tablet 3  . omeprazole (PRILOSEC) 20 MG capsule Take 20 mg by mouth every morning.     [name] Kitchen telmisartan-hydrochlorothiazide (MICARDIS HCT) 80-12.5 MG tablet TAKE ONE-HALF (1/2) TABLET DAILY (DISCARD REMAINDER AFTER OPENING). (Patient taking differently: Take 1 tablet by mouth daily. TAKE ONE-HALF (1/2) TABLET DAILY (DISCARD REMAINDER AFTER OPENING).) 90 tablet 1   No current facility-administered  medications on file prior to visit.     Allergies:  No Known Allergies  Physical Exam General: well developed, well nourished elderly Caucasian male, seated, in no evident distress Head: head normocephalic and atraumatic.  Neck: supple with no carotid or supraclavicular bruits Cardiovascular: regular rate and rhythm, no murmurs Musculoskeletal: no deformity Skin:  no rash/petichiae Vascular:  Normal pulses all extremities Vitals:   11/11/16 1246  BP: 120/74  Pulse: 68   Neurologic Exam Mental Status: Awake and fully alert. Oriented to place and time. Recent and remote memory intact. Attention span, concentration and fund of knowledge appropriate. Mood and affect appropriate.  Cranial Nerves: Fundoscopic exam not done Pupils equal, briskly reactive to light. Extraocular movements full without nystagmus. Visual fields full to confrontation. Hearing intact. Facial sensation intact. Face, tongue,  palate moves normally and symmetrically.  Motor: Normal bulk and tone. Normal strength in all tested extremity muscles. Sensory.: intact to touch ,pinprick .position and vibratory sensation.  Coordination: Rapid alternating movements normal in all extremities. Finger-to-nose and heel-to-shin performed accurately bilaterally. Gait and Station: Arises from chair without difficulty. Stance is normal. Gait demonstrates normal stride length and balance . Able to heel, toe and tandem walk without difficulty.  Reflexes: 1+ and symmetric. Toes downgoing.   NIHSS  0 Modified Rankin  1   ASSESSMENT: 75 year old Caucasian male with possible left hemispheric TIA in June 17 with preceding right MCA embolic branch infarct of cryptogenic etiology while visiting [place]. Vascular risk factors of diabetes, hypertension, hyperlipidemia.Small PFO unlikely to be of clinical significance. Recurrent right brain subcortical infarct likely from small vessel disease in November 2017 from which he has recovered well.    PLAN: I had a long d/w patient about his recent stroke, risk for recurrent stroke/TIAs, personally independently reviewed imaging studies and stroke evaluation results and answered questions.Continue aspirin 81 mg daily and clopidogrel 75 mg daily  for secondary stroke prevention  for a total of 3 months and then discontinue clopidogrel and stay on aspirin alone. and maintain strict control of hypertension with blood pressure goal below 130/90, diabetes with hemoglobin A1c goal below 6.5% and lipids with LDL cholesterol goal below 70 mg/dL. I advised him to continue ongoing home physical and occupational therapy and to use a cane for ambulation for long distances.I also advised the patient to eat a healthy diet with plenty of whole grains, cereals, fruits and vegetables, exercise regularly and maintain ideal body weight Followup in the future with me in 6 months or call earlier if  necessary.Greater than 50% of time during this 25 minute visit was spent on counseling,explanation of diagnosis, planning of further management, discussion with patient and family and coordination of care Antony Contras, MD  [facility] [address] [place], [place] [address]  Phone [phone] Fax [phone] Note: This document was prepared with digital dictation and possible smart phrase technology. Any transcriptional errors that result from this process are unintentional

## 2016-11-12 ENCOUNTER — Ambulatory Visit: Payer: Medicare Other

## 2016-11-12 ENCOUNTER — Ambulatory Visit: Payer: Self-pay

## 2016-11-13 ENCOUNTER — Ambulatory Visit: Payer: Self-pay

## 2016-11-13 DIAGNOSIS — I69393 Ataxia following cerebral infarction: Secondary | ICD-10-CM | POA: Diagnosis not present

## 2016-11-13 DIAGNOSIS — E119 Type 2 diabetes mellitus without complications: Secondary | ICD-10-CM | POA: Diagnosis not present

## 2016-11-13 DIAGNOSIS — M109 Gout, unspecified: Secondary | ICD-10-CM | POA: Diagnosis not present

## 2016-11-13 DIAGNOSIS — I10 Essential (primary) hypertension: Secondary | ICD-10-CM | POA: Diagnosis not present

## 2016-11-13 DIAGNOSIS — Z7902 Long term (current) use of antithrombotics/antiplatelets: Secondary | ICD-10-CM | POA: Diagnosis not present

## 2016-11-13 DIAGNOSIS — I471 Supraventricular tachycardia: Secondary | ICD-10-CM | POA: Diagnosis not present

## 2016-11-19 ENCOUNTER — Ambulatory Visit: Payer: Self-pay

## 2016-11-19 DIAGNOSIS — I69393 Ataxia following cerebral infarction: Secondary | ICD-10-CM | POA: Diagnosis not present

## 2016-11-19 DIAGNOSIS — I10 Essential (primary) hypertension: Secondary | ICD-10-CM | POA: Diagnosis not present

## 2016-11-19 DIAGNOSIS — E119 Type 2 diabetes mellitus without complications: Secondary | ICD-10-CM | POA: Diagnosis not present

## 2016-11-19 DIAGNOSIS — M109 Gout, unspecified: Secondary | ICD-10-CM | POA: Diagnosis not present

## 2016-11-19 DIAGNOSIS — Z7902 Long term (current) use of antithrombotics/antiplatelets: Secondary | ICD-10-CM | POA: Diagnosis not present

## 2016-11-19 DIAGNOSIS — I471 Supraventricular tachycardia: Secondary | ICD-10-CM | POA: Diagnosis not present

## 2016-11-20 ENCOUNTER — Other Ambulatory Visit: Payer: Self-pay

## 2016-11-20 ENCOUNTER — Encounter: Payer: Self-pay | Admitting: Hematology

## 2016-11-20 ENCOUNTER — Telehealth: Payer: Self-pay | Admitting: Hematology

## 2016-11-20 ENCOUNTER — Ambulatory Visit (HOSPITAL_BASED_OUTPATIENT_CLINIC_OR_DEPARTMENT_OTHER): Payer: Medicare Other | Admitting: Hematology

## 2016-11-20 ENCOUNTER — Ambulatory Visit (HOSPITAL_BASED_OUTPATIENT_CLINIC_OR_DEPARTMENT_OTHER): Payer: Medicare Other

## 2016-11-20 VITALS — BP 125/78 | HR 66 | Temp 97.6°F | Resp 18 | Wt 161.0 lb

## 2016-11-20 DIAGNOSIS — I10 Essential (primary) hypertension: Secondary | ICD-10-CM

## 2016-11-20 DIAGNOSIS — D472 Monoclonal gammopathy: Secondary | ICD-10-CM

## 2016-11-20 DIAGNOSIS — I639 Cerebral infarction, unspecified: Secondary | ICD-10-CM | POA: Diagnosis not present

## 2016-11-20 DIAGNOSIS — D649 Anemia, unspecified: Secondary | ICD-10-CM | POA: Diagnosis not present

## 2016-11-20 DIAGNOSIS — E119 Type 2 diabetes mellitus without complications: Secondary | ICD-10-CM

## 2016-11-20 LAB — CBC & DIFF AND RETIC
BASO%: 1.1 % (ref 0.0–2.0)
BASOS ABS: 0 10*3/uL (ref 0.0–0.1)
EOS%: 6.5 % (ref 0.0–7.0)
Eosinophils Absolute: 0.2 10*3/uL (ref 0.0–0.5)
HEMATOCRIT: 32.8 % — AB (ref 38.4–49.9)
HGB: 10.5 g/dL — ABNORMAL LOW (ref 13.0–17.1)
Immature Retic Fract: 2.5 % — ABNORMAL LOW (ref 3.00–10.60)
LYMPH%: 30.1 % (ref 14.0–49.0)
MCH: 28.5 pg (ref 27.2–33.4)
MCHC: 32 g/dL (ref 32.0–36.0)
MCV: 89.1 fL (ref 79.3–98.0)
MONO#: 0.2 10*3/uL (ref 0.1–0.9)
MONO%: 8.6 % (ref 0.0–14.0)
NEUT#: 1.5 10*3/uL (ref 1.5–6.5)
NEUT%: 53.7 % (ref 39.0–75.0)
PLATELETS: 160 10*3/uL (ref 140–400)
RBC: 3.68 10*6/uL — ABNORMAL LOW (ref 4.20–5.82)
RDW: 17.1 % — ABNORMAL HIGH (ref 11.0–14.6)
Retic %: 0.47 % — ABNORMAL LOW (ref 0.80–1.80)
Retic Ct Abs: 17.3 10*3/uL — ABNORMAL LOW (ref 34.80–93.90)
WBC: 2.8 10*3/uL — ABNORMAL LOW (ref 4.0–10.3)
lymph#: 0.8 10*3/uL — ABNORMAL LOW (ref 0.9–3.3)

## 2016-11-20 LAB — COMPREHENSIVE METABOLIC PANEL
ALK PHOS: 75 U/L (ref 40–150)
ALT: 6 U/L (ref 0–55)
ANION GAP: 10 meq/L (ref 3–11)
AST: 17 U/L (ref 5–34)
Albumin: 4 g/dL (ref 3.5–5.0)
BUN: 23.8 mg/dL (ref 7.0–26.0)
CALCIUM: 9.7 mg/dL (ref 8.4–10.4)
CHLORIDE: 105 meq/L (ref 98–109)
CO2: 26 mEq/L (ref 22–29)
CREATININE: 1 mg/dL (ref 0.7–1.3)
EGFR: 70 mL/min/{1.73_m2} — ABNORMAL LOW (ref >90)
Glucose: 111 mg/dl (ref 70–140)
POTASSIUM: 3.9 meq/L (ref 3.5–5.1)
Sodium: 141 mEq/L (ref 136–145)
Total Bilirubin: 0.56 mg/dL (ref 0.20–1.20)
Total Protein: 9.1 g/dL — ABNORMAL HIGH (ref 6.4–8.3)

## 2016-11-20 LAB — LACTATE DEHYDROGENASE: LDH: 117 U/L — AB (ref 125–245)

## 2016-11-20 NOTE — Telephone Encounter (Signed)
GAVE PATIENT AVS REPORT AND APPOINTMENTS FOR December  °

## 2016-11-20 NOTE — Telephone Encounter (Signed)
[facility] WILL CALL RE BONE SURVEY

## 2016-11-20 NOTE — Patient Outreach (Signed)
Justin [name]) Care Management  11/20/2016  Justin [name] May 19, 1941 [id number]   [facility]    Providers: Primary MD: Dr. Serita Grammes - last appt 08/2016 for yearly exam. - [place] appt:  Appt scheduled to see Dr. Alroy Dust 11/25/16 (covering for Dr. Brigitte Pulse) to discuss "an issue of concern".  Patient volunteered no further details.  Neurologist: Dr. Leonie Man - last appt 11/11/16 HH: [facility] - PT/OT services  (Completed services 11/19/16)  Psycho/Social: Justin [name]  Justin [name] [phone] (M) Patient lives in his home.  Mobility: Ambulating with Rolling Walker and cane.  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  States PT has improved some.  Patient confirms he has reviewed/read the Stroke Recovery Booklet from [facility].  Secondary stroke prevention managed with aspirin 81 mg daily and clopidogrel 75 mg daily.  Medications:  Patient taking less than 15 medications  Co-pay cost issues: none   Encounter Medications:  Outpatient Encounter Prescriptions as of 11/20/2016  Medication Sig Note  . allopurinol (ZYLOPRIM) 300 MG tablet Take 300 mg by mouth. 09/03/2016: Received from: [facility] Received Sig: Take 300 mg by mouth daily.  [name] Kitchen aspirin 325 MG tablet Take 1 tablet (325 mg total) by mouth daily.   [name] Kitchen atorvastatin (LIPITOR) 40 MG tablet Take 1 tablet (40 mg total) by mouth  every morning. 10/26/2016: Filled with [facility]; 09-04-16-90 day supply  . clopidogrel (PLAVIX) 75 MG tablet Take 1 tablet (75 mg total) by mouth daily.   . fluorouracil (EFUDEX) 5 % cream Apply 1 application topically daily as needed (for skin cancer prevention).    . metFORMIN (GLUCOPHAGE) 500 MG tablet Take 2 tablets (1,000 mg total) by mouth 2 (two) times daily with a meal. (Patient taking differently: Take by mouth daily with breakfast. ) 10/26/2016: Medication has not been filled at [facility] pharmacy since jan 2017.   . nitroGLYCERIN (NITROSTAT) 0.4 MG SL tablet Place 1 tablet (0.4 mg total) under the tongue every 5 (five) minutes as needed for chest pain. 10/26/2016: ----  . omeprazole (PRILOSEC) 20 MG capsule Take 20 mg by mouth every morning.    [name] Kitchen telmisartan-hydrochlorothiazide (MICARDIS HCT) 80-12.5 MG tablet TAKE ONE-HALF (1/2) TABLET DAILY (DISCARD REMAINDER AFTER OPENING). (Patient taking differently: Take 1 tablet by mouth daily. TAKE ONE-HALF (1/2) TABLET DAILY (DISCARD REMAINDER AFTER OPENING).) 10/26/2016: Last fill date; Feb 2017   No facility-administered encounter medications on file as of 11/20/2016.     Functional Status:  In your present state of health, do you have any difficulty performing the following activities: 11/05/2016 10/26/2016  Hearing? N N  Vision? N N  Difficulty concentrating or making decisions? N N  Walking or climbing stairs? Y N  Dressing or bathing? N N  Doing errands, shopping? Y N  Preparing Food and eating ? N -  Using the Toilet? N -  In the past six months, have you accidently leaked urine?  N -  Do you have problems with loss of bowel control? N -  Managing your Medications? N -  Managing your Finances? N -  Housekeeping or managing your Housekeeping? N -  Some recent data might be hidden    Fall/Depression Screening: PHQ 2/9 Scores 11/05/2016  PHQ - 2 Score 0    Fall Risk  11/05/2016 09/03/2016  Falls in the past year? No No   Risk for fall due to : Impaired balance/gait;Impaired mobility -   Plan:  Referral Date:  11/04/2016 [facility]roke Program:  11/05/2016 Patient eligible for [facility]   Yes   [facility] Education Materials mailed 11/20/2016 -Preventing A Second Stroke:  Know The Signs of Stroke -Preventing Stroke and TIA -Signs of Stroke -What You Can Do To Prevent A Second Stroke  RN CM advised in next [facility] scheduled contact call within one week for [facility] Stroke Program Services  RN CM advised to please notify MD of any changes in condition prior to scheduled appt's.   RN CM provided contact name and # [phone] or main office # [phone] and 24-hour nurse line # [phone].  RN CM confirmed patient is aware of 911 services for urgent emergency needs.  Nathaneil Canary, BSN, RN, [place] Management Care Management Coordinator [phone] Direct [phone] Cell [phone] Office [phone] Fax [email or web address]

## 2016-11-21 DIAGNOSIS — E119 Type 2 diabetes mellitus without complications: Secondary | ICD-10-CM | POA: Diagnosis not present

## 2016-11-21 DIAGNOSIS — I10 Essential (primary) hypertension: Secondary | ICD-10-CM | POA: Diagnosis not present

## 2016-11-21 DIAGNOSIS — M109 Gout, unspecified: Secondary | ICD-10-CM | POA: Diagnosis not present

## 2016-11-21 DIAGNOSIS — Z7902 Long term (current) use of antithrombotics/antiplatelets: Secondary | ICD-10-CM | POA: Diagnosis not present

## 2016-11-21 DIAGNOSIS — I69393 Ataxia following cerebral infarction: Secondary | ICD-10-CM | POA: Diagnosis not present

## 2016-11-21 DIAGNOSIS — I471 Supraventricular tachycardia: Secondary | ICD-10-CM | POA: Diagnosis not present

## 2016-11-21 LAB — KAPPA/LAMBDA LIGHT CHAINS
IG KAPPA FREE LIGHT CHAIN: 15.6 mg/L (ref 3.3–19.4)
Ig Lambda Free Light Chain: 203.2 mg/L — ABNORMAL HIGH (ref 5.7–26.3)
Kappa/Lambda FluidC Ratio: 0.08 — ABNORMAL LOW (ref 0.26–1.65)

## 2016-11-21 LAB — BETA 2 MICROGLOBULIN, SERUM: BETA 2: 2.7 mg/L — AB (ref 0.6–2.4)

## 2016-11-22 DIAGNOSIS — D472 Monoclonal gammopathy: Secondary | ICD-10-CM | POA: Diagnosis not present

## 2016-11-22 LAB — MULTIPLE MYELOMA PANEL, SERUM
ALBUMIN/GLOB SERPL: 0.9 (ref 0.7–1.7)
Albumin SerPl Elph-Mcnc: 4.1 g/dL (ref 2.9–4.4)
Alpha 1: 0.3 g/dL (ref 0.0–0.4)
Alpha2 Glob SerPl Elph-Mcnc: 0.7 g/dL (ref 0.4–1.0)
B-Globulin SerPl Elph-Mcnc: 0.9 g/dL (ref 0.7–1.3)
Gamma Glob SerPl Elph-Mcnc: 2.7 g/dL — ABNORMAL HIGH (ref 0.4–1.8)
Globulin, Total: 4.6 g/dL — ABNORMAL HIGH (ref 2.2–3.9)
IGA/IMMUNOGLOBULIN A, SERUM: 96 mg/dL (ref 61–437)
IGM (IMMUNOGLOBIN M), SRM: 37 mg/dL (ref 15–143)
IgG, Qn, Serum: 2845 mg/dL — ABNORMAL HIGH (ref 700–1600)
M Protein SerPl Elph-Mcnc: 2.1 g/dL — ABNORMAL HIGH
TOTAL PROTEIN: 8.7 g/dL — AB (ref 6.0–8.5)

## 2016-11-25 DIAGNOSIS — K4090 Unilateral inguinal hernia, without obstruction or gangrene, not specified as recurrent: Secondary | ICD-10-CM | POA: Diagnosis not present

## 2016-11-25 DIAGNOSIS — L723 Sebaceous cyst: Secondary | ICD-10-CM | POA: Diagnosis not present

## 2016-11-25 LAB — UPEP/TP, 24-HR URINE
Albumin, U: 6.8 %
Alpha-1-Globulin, U: 3.1 %
Alpha-2-Globulin, U: 13.6 %
BETA GLOBULIN, U: 31 %
GAMMA GLOBULIN, U: 45.4 %
M-SPIKE, %-U24PEL: 21.5 % — AB
M-SPIKE, MG/24 HR U24PEL: 24.4 mg/(24.h) — AB
PROTEIN UR: 7.1 mg/dL
Prot,24hr calculated: 114 mg/24 hr (ref 30–150)

## 2016-11-27 ENCOUNTER — Ambulatory Visit: Payer: Self-pay

## 2016-11-28 ENCOUNTER — Ambulatory Visit: Payer: Self-pay

## 2016-11-28 ENCOUNTER — Ambulatory Visit (HOSPITAL_COMMUNITY)
Admission: RE | Admit: 2016-11-28 | Discharge: 2016-11-28 | Disposition: A | Discharge status: Home / Self Care | Payer: Medicare Other | Source: Ambulatory Visit | Attending: Hematology | Admitting: Hematology

## 2016-11-28 DIAGNOSIS — D472 Monoclonal gammopathy: Secondary | ICD-10-CM | POA: Diagnosis not present

## 2016-11-29 ENCOUNTER — Ambulatory Visit: Payer: Self-pay

## 2016-12-02 ENCOUNTER — Ambulatory Visit (INDEPENDENT_AMBULATORY_CARE_PROVIDER_SITE_OTHER): Payer: Medicare Other | Admitting: *Deleted

## 2016-12-02 ENCOUNTER — Ambulatory Visit: Payer: Self-pay

## 2016-12-02 DIAGNOSIS — I639 Cerebral infarction, unspecified: Secondary | ICD-10-CM | POA: Diagnosis not present

## 2016-12-02 NOTE — Progress Notes (Signed)
Carelink Summary Report / Loop Recorder 

## 2016-12-04 ENCOUNTER — Ambulatory Visit: Payer: Self-pay

## 2016-12-05 ENCOUNTER — Ambulatory Visit: Payer: Self-pay

## 2016-12-06 ENCOUNTER — Ambulatory Visit: Payer: Self-pay | Admitting: Surgery

## 2016-12-06 ENCOUNTER — Ambulatory Visit: Payer: Self-pay

## 2016-12-06 DIAGNOSIS — K4020 Bilateral inguinal hernia, without obstruction or gangrene, not specified as recurrent: Secondary | ICD-10-CM | POA: Diagnosis not present

## 2016-12-06 NOTE — H&P (Signed)
Justin [name] [facility] 12/06/2016 2:45 PM Location: [facility] Patient #: [id number] DOB: [birth year] Married / Language: Justin [name] / Race: White Male  History of Present Illness (Justin Traynham A. Kae Heller MD; 12/06/2016 3:32 PM) Patient words: This very nice 75 year old man who presents with a right inguinal hernia. He noticed it about 3 weeks ago, and has caused him significant pain which fortunately was relieved with the initiation of using a hernia belt. He denies any symptoms of nausea, vomiting, abdominal distention or change in bowel habits. Of note he had an ischemic stroke at the beginning of November, which left him with some residual left lower extremities weakness and he now uses a cane. He wonders if the physical therapy that he did for his stroke may have incurred the hernia to become symptomatic. He has never had any abdominal surgery. He takes full dose aspirin and Plavix for his stroke. 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  The patient is a 75 year old male.   Past Surgical History Benjiman Core, [place]; 12/06/2016 2:46 PM) No pertinent past surgical history  Diagnostic Studies History Benjiman Core, [place]; 12/06/2016 2:46 PM) Colonoscopy 1-5 years ago  Allergies Benjiman Core, CMA; 12/06/2016 2:47 PM) No Known Drug Allergies 12/06/2016  Medication History Benjiman Core, CMA; 12/06/2016 2:48 PM) Allopurinol (300MG  Tablet, Oral) Active. Clopidogrel Bisulfate (75MG  Tablet, Oral) Active. Atorvastatin Calcium (40MG  Tablet, Oral) Active. Aspirin (81MG  Tablet, Oral) Active. MetFORMIN HCl (1000MG  Tablet, Oral) Active. Nitrostat (0.4MG  Tab Sublingual, Sublingual) Active. Medications Reconciled  Social History Benjiman Core, CMA; 12/06/2016  2:46 PM) No alcohol use No caffeine use No drug use Tobacco use Never smoker.  Family History Benjiman Core, [place]; 12/06/2016 2:46 PM) Alcohol Abuse Son. Arthritis Brother. Bleeding disorder Brother. Cerebrovascular Accident Mother. Heart Disease Father, Mother. Hypertension Father, Mother.  Other Problems Benjiman Core, Twin; 12/06/2016 2:46 PM) Cerebrovascular Accident Chest pain Diabetes Mellitus Gastroesophageal Reflux Disease Heart murmur High blood pressure Inguinal Hernia     Review of Systems (Nakea Gouger A. Kae Heller MD; 12/06/2016 3:32 PM) General Not Present- Appetite Loss, Chills, Fatigue, Fever, Night Sweats, Weight Gain and Weight Loss. Skin Present- Change in Wart/Mole. Not Present- Dryness, Hives, Jaundice, New Lesions, Non-Healing Wounds, Rash and Ulcer. HEENT Not Present- Earache, Hearing Loss, Hoarseness, Nose Bleed, Oral Ulcers, Ringing in the Ears, Seasonal Allergies, Sinus Pain, Sore Throat, Visual Disturbances, Wears glasses/contact lenses and Yellow Eyes. Respiratory Not Present- Bloody sputum, Chronic Cough, Difficulty Breathing, Snoring and Wheezing. Breast Not Present- Breast Mass, Breast Pain, Nipple Discharge and Skin Changes. Cardiovascular Not Present- Chest Pain, Difficulty Breathing Lying Down, Leg Cramps, Palpitations, Rapid Heart Rate, Shortness of Breath and Swelling of Extremities. Gastrointestinal Not Present- Abdominal Pain, Bloating, Bloody Stool, Change in Bowel Habits, Chronic diarrhea, Constipation, Difficulty Swallowing, Excessive gas, Gets full quickly at meals, Hemorrhoids, Indigestion, Nausea, Rectal Pain and Vomiting. Male Genitourinary Present- Impotence. Not Present- Blood in Urine, Change in Urinary Stream, Frequency, Nocturia, Painful Urination, Urgency and Urine Leakage. All other systems negative  Vitals (Armen Glenn CMA; 12/06/2016 2:47 PM) 12/06/2016 2:46 PM Weight: 162.13 lb Height: 69in Body Surface Area: 1.89  m Body Mass Index: 23.94 kg/m  Temp.: 97.61F  Pulse: 89 (Regular)  P.OX: 97% (Room air) BP: 124/80 (Sitting, Left Arm, Standard)      Physical Exam (Haleem Hanner A. Kae Heller MD;  12/06/2016 3:33 PM)  The physical exam findings are as follows: Note:Appears stated age. No distress Extraocular motion intact, anicteric Moist mucous membranes, good dentition Neck without mass or thyromegaly Unlabored respirations, clear bilaterally Regular rate and rhythm, palpable radial pulses bilaterally Abdomen soft, nontender nondistended. There is a large reducible right inguinal hernia as well as some laxity on the left consistent with a small left inguinal hernia. Extremities are warm and well perfused Neuro grossly intact, somewhat lle weakness Psych mood and affect, appropriate insight    Assessment & Plan (Randee Huston A. Kae Heller MD; 12/06/2016 3:35 PM)  BILATERAL INGUINAL HERNIA (K40.20) Story: We'll plan for laparoscopic repair. We discussed in depth the technique of this operation, the risks and alternatives. He understands and is agreeable to proceed. We will need to hold the aspirin (can still take baby aspirin) and Plavix for 7 days prior to surgery, we'll confer with his neurologist that this is acceptable. Otherwise he may have to wait until he is off the Plavix in February.

## 2016-12-09 ENCOUNTER — Ambulatory Visit: Payer: Self-pay

## 2016-12-10 ENCOUNTER — Telehealth: Payer: Self-pay | Admitting: Hematology

## 2016-12-10 ENCOUNTER — Ambulatory Visit (HOSPITAL_BASED_OUTPATIENT_CLINIC_OR_DEPARTMENT_OTHER): Payer: Medicare Other

## 2016-12-10 ENCOUNTER — Ambulatory Visit: Payer: Self-pay

## 2016-12-10 ENCOUNTER — Encounter: Payer: Self-pay | Admitting: Hematology

## 2016-12-10 ENCOUNTER — Ambulatory Visit (HOSPITAL_BASED_OUTPATIENT_CLINIC_OR_DEPARTMENT_OTHER): Payer: Medicare Other | Admitting: Hematology

## 2016-12-10 VITALS — BP 113/68 | HR 80 | Temp 97.9°F | Resp 18 | Ht 69.0 in | Wt 163.6 lb

## 2016-12-10 DIAGNOSIS — E119 Type 2 diabetes mellitus without complications: Secondary | ICD-10-CM | POA: Diagnosis not present

## 2016-12-10 DIAGNOSIS — E538 Deficiency of other specified B group vitamins: Secondary | ICD-10-CM

## 2016-12-10 DIAGNOSIS — Z8673 Personal history of transient ischemic attack (TIA), and cerebral infarction without residual deficits: Secondary | ICD-10-CM

## 2016-12-10 DIAGNOSIS — D472 Monoclonal gammopathy: Secondary | ICD-10-CM

## 2016-12-10 DIAGNOSIS — D649 Anemia, unspecified: Secondary | ICD-10-CM

## 2016-12-10 DIAGNOSIS — I10 Essential (primary) hypertension: Secondary | ICD-10-CM | POA: Diagnosis not present

## 2016-12-10 DIAGNOSIS — M5137 Other intervertebral disc degeneration, lumbosacral region: Secondary | ICD-10-CM

## 2016-12-10 LAB — CBC & DIFF AND RETIC
BASO%: 0.3 % (ref 0.0–2.0)
BASOS ABS: 0 10*3/uL (ref 0.0–0.1)
EOS%: 3.7 % (ref 0.0–7.0)
Eosinophils Absolute: 0.1 10*3/uL (ref 0.0–0.5)
HEMATOCRIT: 31.4 % — AB (ref 38.4–49.9)
HGB: 10.2 g/dL — ABNORMAL LOW (ref 13.0–17.1)
IMMATURE RETIC FRACT: 9.6 % (ref 3.00–10.60)
LYMPH#: 1 10*3/uL (ref 0.9–3.3)
LYMPH%: 26.9 % (ref 14.0–49.0)
MCH: 28.9 pg (ref 27.2–33.4)
MCHC: 32.5 g/dL (ref 32.0–36.0)
MCV: 89 fL (ref 79.3–98.0)
MONO#: 0.3 10*3/uL (ref 0.1–0.9)
MONO%: 7.2 % (ref 0.0–14.0)
NEUT#: 2.3 10*3/uL (ref 1.5–6.5)
NEUT%: 61.9 % (ref 39.0–75.0)
PLATELETS: 243 10*3/uL (ref 140–400)
RBC: 3.53 10*6/uL — ABNORMAL LOW (ref 4.20–5.82)
RDW: 16.6 % — ABNORMAL HIGH (ref 11.0–14.6)
RETIC CT ABS: 19.77 10*3/uL — AB (ref 34.80–93.90)
Retic %: 0.56 % — ABNORMAL LOW (ref 0.80–1.80)
WBC: 3.8 10*3/uL — ABNORMAL LOW (ref 4.0–10.3)

## 2016-12-10 LAB — IRON AND TIBC
%SAT: 12 % — AB (ref 20–55)
Iron: 25 ug/dL — ABNORMAL LOW (ref 42–163)
TIBC: 205 ug/dL (ref 202–409)
UIBC: 180 ug/dL (ref 117–376)

## 2016-12-10 LAB — FERRITIN: Ferritin: 944 ng/ml — ABNORMAL HIGH (ref 22–316)

## 2016-12-10 NOTE — Telephone Encounter (Signed)
Appointments scheduled per 12/10/16 los. A copy of the appointment schedule and AVS report was given to the patient, per 12/10/16 los. °

## 2016-12-11 ENCOUNTER — Ambulatory Visit: Payer: Self-pay

## 2016-12-11 LAB — VITAMIN B12: VITAMIN B 12: 344 pg/mL (ref 232–1245)

## 2016-12-11 LAB — SEDIMENTATION RATE: Sedimentation Rate-Westergren: 24 mm/hr (ref 0–30)

## 2016-12-12 ENCOUNTER — Ambulatory Visit: Payer: Self-pay

## 2016-12-13 ENCOUNTER — Ambulatory Visit: Payer: Self-pay

## 2016-12-17 ENCOUNTER — Other Ambulatory Visit: Payer: Self-pay

## 2016-12-17 NOTE — Patient Outreach (Signed)
[facility]) Care Management  12/17/2016  Justin [name] 06/28/1941 [id number]   [place] [facility]    Referral Date:  11/04/2016 [facility]roke Program:  11/05/2016 Patient eligible for [facility]   Yes   Providers: Primary MD: Dr. Serita Grammes - last appt 08/2016 for yearly exam and 12/05/16 for other concern.  Neurologist: Dr. Leonie Man - last appt 11/11/16 HH: [facility] - PT/OT services  (Completed services 11/19/16)  Psycho/Social: [place] [address] [phone] (M) Patient lives in his home.  Mobility: Ambulating with Rolling Walker and cane.  Falls: none  Caregiver:  Wife, Justin [name] or Son, Justin [name] DME:  Vassie Moselle, [place]   Co-morbidities  CVA - right MCA and ACA infarcts (2nd Stroke 10/25/2016) H/o past CVA (cerebrovascular accident), TIA (transient ischemic attack), Benign essential HTN, Paroxysmal SVT (supraventricular tachycardia) , Acute blood loss anemia, HLD, DM, PFO Admissions: (2)   10/25/16 - 10/30/16  CVA - right MCA and ACA infarcts 06/01/16 - 06/05/16  TIA (transient ischemic attack) Patient states he had no deficits with his first stroke but has weakness in left leg with most recent stroke on 10/25/2016.  States PT has improved some but slow recovery at this time due to hernia issue.  Planning repair to be scheduled sometime in January 2018.   Patient reviewed/read the Stroke Recovery Booklet from [facility].  Secondary stroke prevention managed with aspirin 81 mg daily and clopidogrel 75 mg daily.  Medications:  Patient taking less than 15 medications  Co-pay cost issues: none   Encounter Medications:  Outpatient Encounter Prescriptions as of 12/17/2016  Medication Sig Note  . allopurinol (ZYLOPRIM) 300 MG tablet Take 300 mg by mouth. 09/03/2016: Received from: [facility] Received Sig: Take 300 mg by mouth daily.  [name] Kitchen aspirin 325 MG tablet Take 1 tablet (325 mg total) by mouth daily.   [name] Kitchen  atorvastatin (LIPITOR) 40 MG tablet Take 1 tablet (40 mg total) by mouth every morning. 10/26/2016: Filled with [facility]; 09-04-16-90 day supply  . clopidogrel (PLAVIX) 75 MG tablet Take 1 tablet (75 mg total) by mouth daily.   . fluorouracil (EFUDEX) 5 % cream Apply 1 application topically daily as needed (for skin cancer prevention).    . metFORMIN (GLUCOPHAGE) 500 MG tablet Take 2 tablets (1,000 mg total) by mouth 2 (two) times daily with a meal. (Patient taking differently: Take by mouth daily with breakfast. ) 10/26/2016: Medication has not been filled at [facility] pharmacy since jan 2017.   . nitroGLYCERIN (NITROSTAT) 0.4 MG SL tablet Place 1 tablet (0.4 mg total) under the tongue every 5 (five) minutes as needed for chest pain. 10/26/2016: ----  . omeprazole (PRILOSEC) 20 MG capsule Take 20 mg by mouth every morning.    [name] Kitchen telmisartan-hydrochlorothiazide (MICARDIS HCT) 80-12.5 MG tablet TAKE ONE-HALF (1/2) TABLET DAILY (DISCARD REMAINDER AFTER OPENING). (Patient taking differently: Take 1 tablet by mouth daily. TAKE ONE-HALF (1/2) TABLET DAILY (DISCARD REMAINDER AFTER OPENING).) 10/26/2016: Last fill date; Feb 2017   No facility-administered encounter medications on file as of 12/17/2016.     Functional Status:  In your present state of health, do you have any difficulty performing the following activities: 11/05/2016 10/26/2016  Hearing? N N  Vision? N N  Difficulty concentrating or making decisions? N N  Walking or climbing stairs? Y N  Dressing or bathing? N N  Doing errands, shopping? Y N  Preparing Food and eating ? N -  Using the  Toilet? N -  In the past six months, have you accidently leaked urine? N -  Do you have problems with loss of bowel control? N -  Managing your Medications? N -  Managing your Finances? N -  Housekeeping or managing your Housekeeping? N -  Some recent data might be hidden    Fall/Depression Screening: PHQ 2/9 Scores 11/05/2016  PHQ - 2 Score  0    Fall Risk  11/20/2016 11/05/2016 09/03/2016  Falls in the past year? No No No  Risk for fall due to : - Impaired balance/gait;Impaired mobility -   Plan:  Referral Date:  11/04/2016 [facility]roke Program:  11/05/2016 - 12/17/2016 Patient eligible for [facility]   Yes   [facility] Education Materials mailed 11/20/2016  (reviewed 12/17/16) -Preventing A Second Stroke:  Know The Signs of Stroke -Preventing Stroke and TIA -Signs of Stroke -What You Can Do To Prevent A Second Stroke  RN CM advised in case closure; goals of care met.  [facility] and Physician notified.    RN CM advised to please notify MD of any changes in condition prior to scheduled appt's.   RN CM provided contact name and # [phone] or main office # [phone] and 24-hour nurse [name] # [phone].  RN CM confirmed patient is aware of 911 services for urgent emergency needs.  Nathaneil Canary, BSN, RN, [facility] Care Management Coordinator [phone] Direct [phone] Cell [phone] Office [phone] Fax [email or web address]

## 2016-12-18 LAB — CUP PACEART REMOTE DEVICE CHECK
Implantable Pulse Generator Implant Date: 20170613
MDC IDC SESS DTM: 20171111004140

## 2016-12-20 ENCOUNTER — Other Ambulatory Visit: Payer: Self-pay | Admitting: General Surgery

## 2016-12-24 ENCOUNTER — Ambulatory Visit (HOSPITAL_COMMUNITY)
Admission: RE | Admit: 2016-12-24 | Discharge: 2016-12-24 | Disposition: A | Discharge status: Home / Self Care | Payer: Medicare Other | Source: Ambulatory Visit | Attending: Hematology | Admitting: Hematology

## 2016-12-24 ENCOUNTER — Encounter (HOSPITAL_COMMUNITY): Payer: Self-pay

## 2016-12-24 DIAGNOSIS — C9030 Solitary plasmacytoma not having achieved remission: Secondary | ICD-10-CM | POA: Diagnosis not present

## 2016-12-24 DIAGNOSIS — I10 Essential (primary) hypertension: Secondary | ICD-10-CM | POA: Diagnosis not present

## 2016-12-24 DIAGNOSIS — E119 Type 2 diabetes mellitus without complications: Secondary | ICD-10-CM | POA: Insufficient documentation

## 2016-12-24 DIAGNOSIS — Z8673 Personal history of transient ischemic attack (TIA), and cerebral infarction without residual deficits: Secondary | ICD-10-CM | POA: Diagnosis not present

## 2016-12-24 DIAGNOSIS — I471 Supraventricular tachycardia: Secondary | ICD-10-CM | POA: Insufficient documentation

## 2016-12-24 DIAGNOSIS — D4989 Neoplasm of unspecified behavior of other specified sites: Secondary | ICD-10-CM | POA: Diagnosis not present

## 2016-12-24 DIAGNOSIS — Z8249 Family history of ischemic heart disease and other diseases of the circulatory system: Secondary | ICD-10-CM | POA: Insufficient documentation

## 2016-12-24 DIAGNOSIS — D72819 Decreased white blood cell count, unspecified: Secondary | ICD-10-CM | POA: Diagnosis not present

## 2016-12-24 DIAGNOSIS — Z9889 Other specified postprocedural states: Secondary | ICD-10-CM | POA: Insufficient documentation

## 2016-12-24 DIAGNOSIS — Z7902 Long term (current) use of antithrombotics/antiplatelets: Secondary | ICD-10-CM | POA: Insufficient documentation

## 2016-12-24 DIAGNOSIS — K4090 Unilateral inguinal hernia, without obstruction or gangrene, not specified as recurrent: Secondary | ICD-10-CM | POA: Insufficient documentation

## 2016-12-24 DIAGNOSIS — M109 Gout, unspecified: Secondary | ICD-10-CM | POA: Diagnosis not present

## 2016-12-24 DIAGNOSIS — K219 Gastro-esophageal reflux disease without esophagitis: Secondary | ICD-10-CM | POA: Insufficient documentation

## 2016-12-24 DIAGNOSIS — D649 Anemia, unspecified: Secondary | ICD-10-CM | POA: Diagnosis not present

## 2016-12-24 DIAGNOSIS — D472 Monoclonal gammopathy: Secondary | ICD-10-CM | POA: Insufficient documentation

## 2016-12-24 DIAGNOSIS — Z823 Family history of stroke: Secondary | ICD-10-CM | POA: Diagnosis not present

## 2016-12-24 HISTORY — DX: Malignant (primary) neoplasm, unspecified: C80.1

## 2016-12-24 HISTORY — DX: Other specified postprocedural states: Z98.890

## 2016-12-24 HISTORY — DX: Other symptoms and signs involving the musculoskeletal system: R29.898

## 2016-12-24 LAB — APTT: aPTT: 33 seconds (ref 24–36)

## 2016-12-24 LAB — CBC
HCT: 33.8 % — ABNORMAL LOW (ref 39.0–52.0)
HEMOGLOBIN: 11.2 g/dL — AB (ref 13.0–17.0)
MCH: 28.9 pg (ref 26.0–34.0)
MCHC: 33.1 g/dL (ref 30.0–36.0)
MCV: 87.3 fL (ref 78.0–100.0)
PLATELETS: 221 10*3/uL (ref 150–400)
RBC: 3.87 MIL/uL — AB (ref 4.22–5.81)
RDW: 16.9 % — ABNORMAL HIGH (ref 11.5–15.5)
WBC: 3.2 10*3/uL — AB (ref 4.0–10.5)

## 2016-12-24 LAB — PROTIME-INR
INR: 0.94
PROTHROMBIN TIME: 12.6 s (ref 11.4–15.2)

## 2016-12-24 LAB — GLUCOSE, CAPILLARY: GLUCOSE-CAPILLARY: 100 mg/dL — AB (ref 65–99)

## 2016-12-24 LAB — BONE MARROW EXAM

## 2016-12-24 MED ORDER — FENTANYL CITRATE (PF) 100 MCG/2ML IJ SOLN
INTRAMUSCULAR | Status: AC
  Filled 2016-12-24: qty 4

## 2016-12-24 MED ORDER — MIDAZOLAM HCL 2 MG/2ML IJ SOLN
INTRAMUSCULAR | Status: AC | PRN
  Administered 2016-12-24 (×3): 1 mg via INTRAVENOUS

## 2016-12-24 MED ORDER — MIDAZOLAM HCL 2 MG/2ML IJ SOLN
INTRAMUSCULAR | Status: AC
  Filled 2016-12-24: qty 6

## 2016-12-24 MED ORDER — FENTANYL CITRATE (PF) 100 MCG/2ML IJ SOLN
INTRAMUSCULAR | Status: AC | PRN
  Administered 2016-12-24: 50 ug via INTRAVENOUS

## 2016-12-24 MED ORDER — SODIUM CHLORIDE 0.9 % IV SOLN: INTRAVENOUS | Status: DC

## 2016-12-24 MED ORDER — LIDOCAINE-EPINEPHRINE (PF) 2 %-1:200000 IJ SOLN
INTRAMUSCULAR | Status: AC
  Filled 2016-12-24: qty 20

## 2016-12-24 NOTE — H&P (Signed)
Chief Complaint: anemia  Referring Physician:Dr. Sullivan [name]  Supervising Physician: Justin [name]  Patient Status: [facility] - Out-pt  HPI: Justin [name] is an 76 y.o. male with a history of anemia who is currently being followed by Dr. Irene [name] for work up to rule out multiple myeloma.  The patient currently takes plavix and ASA due to recent CVA.  He has no residual effects from that.  He denies any complaints except a RIH for which he wears a truss for.  He presents today for a bone marrow biopsy.  Past Medical History:  Past Medical History:  Diagnosis Date  . 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 Diabetes mellitus without complication ([place])   . GERD (gastroesophageal reflux disease)   . Gout   . History of loop recorder   . Hypertension   . Left leg weakness   . Paroxysmal SVT (supraventricular tachycardia) ([place])    a. s/p RFCA on 05/01/15  . Stroke [facility])     Past Surgical History:  Past Surgical History:  Procedure Laterality Date  . BASAL CELL CARCINOMA EXCISION     off of back  . ELECTROPHYSIOLOGIC STUDY N/A 05/01/2015   Procedure: SVT Ablation;  Surgeon: Evans Lance, MD;  Location: [place] CV LAB;  Service: Cardiovascular;  Laterality: N/A;  . EP IMPLANTABLE DEVICE N/A 06/04/2016   Procedure: Loop Recorder Insertion;  Surgeon: Thompson Grayer, MD;  Location: [place] CV LAB;  Service: Cardiovascular;  Laterality: N/A;  . SQUAMOUS CELL CARCINOMA EXCISION    . TEE WITHOUT CARDIOVERSION N/A 06/04/2016   Procedure: TRANSESOPHAGEAL ECHOCARDIOGRAM (TEE);  Surgeon: Pixie Casino, MD;  Location: [facility] ENDOSCOPY;  Service: Cardiovascular;  Laterality: N/A;    Family History:  Family History  Problem Relation Age of Onset  . Stroke Mother   . Hypertension Mother   . Alzheimer's disease Father   . Heart failure Father   . Down syndrome Daughter     Social History:  reports that he has never smoked. He has never used smokeless tobacco. He  reports that he does not drink alcohol or use drugs.  Allergies: No Known Allergies  Medications: Medications reviewed in epic  Please HPI for pertinent positives, otherwise complete 10 system ROS negative.  Mallampati Score: MD Evaluation Airway: WNL Heart: WNL Abdomen: WNL Chest/ Lungs: WNL ASA  Classification: 3 Mallampati/Airway Score: Four  Physical Exam: BP 137/71   Pulse 76   Temp 98.4 F (36.9 C) (Oral)   Resp 16   Ht 5' 9"  (1.753 m)   Wt 165 lb (74.8 kg)   SpO2 98%   BMI 24.37 kg/m  Body mass index is 24.37 kg/m. General: pleasant, WD, WN white male who is laying in bed in NAD HEENT: head is normocephalic, atraumatic.  Sclera are noninjected.  PERRL.  Ears and nose without any masses or lesions.  Mouth is pink and moist Heart: regular, rate, and rhythm.  Normal s1,s2. No obvious murmurs, gallops, or rubs noted.  Palpable radial and pedal pulses bilaterally Lungs: CTAB, no wheezes, rhonchi, or rales noted.  Respiratory effort nonlabored Abd: soft, NT, ND, +BS, no masses or organomegaly.  Truss in place for [facility] Psych: A&Ox3 with an appropriate affect.   Labs: Results for orders placed or performed during the hospital encounter of 12/24/16 (from the past 48 hour(s))  APTT upon arrival     Status: None   Collection Time: 12/24/16  9:31 AM  Result Value Ref Range   aPTT 33 24 - 36 seconds  CBC upon arrival     Status: Abnormal   Collection Time: 12/24/16  9:31 AM  Result Value Ref Range   WBC 3.2 (L) 4.0 - 10.5 K/uL   RBC 3.87 (L) 4.22 - 5.81 MIL/uL   Hemoglobin 11.2 (L) 13.0 - 17.0 g/dL   HCT 33.8 (L) 39.0 - 52.0 %   MCV 87.3 78.0 - 100.0 fL   MCH 28.9 26.0 - 34.0 pg   MCHC 33.1 30.0 - 36.0 g/dL   RDW 16.9 (H) 11.5 - 15.5 %   Platelets 221 150 - 400 K/uL  Protime-INR upon arrival     Status: None   Collection Time: 12/24/16  9:31 AM  Result Value Ref Range   Prothrombin Time 12.6 11.4 - 15.2 seconds   INR 0.94     Imaging: No results  found.  Assessment/Plan 1. Anemia -we will plan to proceed today with a bone marrow biopsy to assist in the work up of myeloma. -labs and vitals have been reviewed -Risks and Benefits discussed with the patient including, but not limited to bleeding, infection, damage to adjacent structures or low yield requiring additional tests. All of the patient's questions were answered, patient is agreeable to proceed. Consent signed and in chart.   Thank you for this interesting consult.  I greatly enjoyed meeting Justin [name] and look forward to participating in their care.  A copy of this report was sent to the requesting provider on this date.  Electronically Signed: Henreitta Cea 12/24/2016, 10:09 AM   I spent a total of  30 Minutes   in face to face in clinical consultation, greater than 50% of which was counseling/coordinating care for anemia

## 2016-12-24 NOTE — Procedures (Signed)
Technically successful CT guided bone marrow aspiration and biopsy of left iliac crest. EBL: None No immediate complications.    SignedSandi Mariscal [phone] 12/24/2016, 11:40 AM

## 2016-12-24 NOTE — Discharge Instructions (Signed)
Bone Marrow Aspiration and Bone Marrow Biopsy, Adult Bone marrow aspiration and bone marrow biopsy are procedures that are done to diagnose blood disorders. You may also have one of these procedures to help diagnose infections or some types of cancer. Bone marrow is the soft tissue that is inside your bones. Blood cells are produced in bone marrow. For bone marrow aspiration, a sample of tissue in liquid form is removed from inside your bone. For a bone marrow biopsy, a small core of bone marrow tissue is removed. These samples are examined under a microscope or tested in a lab. You may need these procedures if you have an abnormal complete blood count (CBC). The aspiration or biopsy sample is usually taken from the top of your hip bone. Sometimes, an aspiration sample is taken from your chest bone (sternum). Tell a health care provider about:  Any allergies you have.  All medicines you are taking, including vitamins, herbs, eye drops, creams, and over-the-counter medicines.  Any problems you or family members have had with anesthetic medicines.  Any blood or bone disorders you have.  Any surgeries you have had.  Any medical conditions you have.  Whether you are pregnant or you think that you may be pregnant. What are the risks? Generally, this is a safe procedure. However, problems may occur, including:  Infection.  Bleeding.  Persistent pain after the procedure.  Cracking (fracture) of the bone.  Allergic reactions to medicines. What happens before the procedure? Staying hydrated  Follow instructions from your health care provider about hydration, which may include:  Up to 2 hours before the procedure - you may continue to drink clear liquids, such as water, clear fruit juice, black coffee, and plain tea. Eating and drinking restrictions  Follow instructions from your health care provider about eating and drinking, which may include:  8 hours before the procedure - stop  eating heavy meals or foods such as meat, fried foods, or fatty foods.  6 hours before the procedure - stop eating light meals or foods, such as toast or cereal.  6 hours before the procedure - stop drinking milk or drinks that contain milk.  2 hours before the procedure - stop drinking clear liquids. Medicines  Ask your health care provider about:  Changing or stopping your regular medicines. This is especially important if you are taking diabetes medicines or blood thinners.  Taking medicines such as aspirin and ibuprofen. These medicines can thin your blood. Do not take these medicines before your procedure if your health care provider instructs you not to.  You may be given antibiotic medicine to help prevent infection. General instructions  Plan to have someone take you home after the procedure.  If you will be going home right after the procedure, plan to have someone with you for 24 hours.  Ask your health care provider how your surgical site will be marked or identified. What happens during the procedure?  To reduce your risk of infection:  Your health care team will wash or sanitize their hands.  Your skin will be washed with soap.  Hair may be removed from the surgical area.  An IV tube may be inserted into one of your veins.  The injection site will be cleaned with a germ-killing solution (antiseptic).  You will be given one or more of the following:  A medicine to help you relax (sedative).  A medicine to numb the area (local anesthetic).  A medicine to make you fall asleep (general  anesthetic).  The bone marrow sample will be removed as follows:  For an aspiration, a hollow needle will be inserted through your skin and into your bone. Bone marrow fluid will be drawn up into a syringe.  For a biopsy, your health care provider will use a hollow needle to remove a core of tissue from your bone marrow.  The needle will be removed.  A bandage (dressing)  will be placed over the insertion site and taped in place. The procedure may vary among health care providers and hospitals. What happens after the procedure?  Your blood pressure, heart rate, breathing rate, and blood oxygen level will be monitored until the medicines you were given have worn off.  Your IV tube will be removed, and the insertion site will be checked for bleeding.  Do not drive for 24 hours if you were given a sedative. This information is not intended to replace advice given to you by your health care provider. Make sure you discuss any questions you have with your health care provider. Document Released: 12/12/2004 Document Revised: 06/28/2016 Document Reviewed: 05/22/2016 Elsevier Interactive Patient Education  2017 [employer]. Bone Marrow Aspiration and Bone Marrow Biopsy, Adult, Care After This sheet gives you information about how to care for yourself after your procedure. Your health care provider may also give you more specific instructions. If you have problems or questions, contact your health care provider. What can I expect after the procedure? After the procedure, it is common to have:  Mild pain and tenderness.  Swelling.  Bruising. Follow these instructions at home:  Take over-the-counter or prescription medicines only as told by your health care provider.  Do not take baths, swim, or use a hot tub until your health care provider approves. Ask if you can take a shower or have a sponge bath.  Follow instructions from your health care provider about how to take care of the puncture site. Make sure you:  Wash your hands with soap and water before you change your bandage (dressing). If soap and water are not available, use hand sanitizer.  Change your dressing as told by your health care provider.  Check your puncture siteevery day for signs of infection. Check for:  More redness, swelling, or pain.  More fluid or blood.  Warmth.  Pus or a bad  smell.  Return to your normal activities as told by your health care provider. Ask your health care provider what activities are safe for you.  Do not drive for 24 hours if you were given a medicine to help you relax (sedative).  Keep all follow-up visits as told by your health care provider. This is important. Contact a health care provider if:  You have more redness, swelling, or pain around the puncture site.  You have more fluid or blood coming from the puncture site.  Your puncture site feels warm to the touch.  You have pus or a bad smell coming from the puncture site.  You have a fever.  Your pain is not controlled with medicine. This information is not intended to replace advice given to you by your health care provider. Make sure you discuss any questions you have with your health care provider. Document Released: 06/28/2005 Document Revised: 06/28/2016 Document Reviewed: 05/22/2016 Elsevier Interactive Patient Education  2017 [place]. Moderate Conscious Sedation, Adult Sedation is the use of medicines to promote relaxation and relieve discomfort and anxiety. Moderate conscious sedation is a type of sedation. Under moderate conscious sedation,  you are less alert than normal, but you are still able to respond to instructions, touch, or both. Moderate conscious sedation is used during short medical and dental procedures. It is milder than deep sedation, which is a type of sedation under which you cannot be easily woken up. It is also milder than general anesthesia, which is the use of medicines to make you unconscious. Moderate conscious sedation allows you to return to your regular activities sooner. Tell a health care provider about:  Any allergies you have.  All medicines you are taking, including vitamins, herbs, eye drops, creams, and over-the-counter medicines.  Use of steroids (by mouth or creams).  Any problems you or family members have had with sedatives and  anesthetic medicines.  Any blood disorders you have.  Any surgeries you have had.  Any medical conditions you have, such as sleep apnea.  Whether you are pregnant or may be pregnant.  Any use of cigarettes, alcohol, marijuana, or street drugs. What are the risks? Generally, this is a safe procedure. However, problems may occur, including:  Getting too much medicine (oversedation).  Nausea.  Allergic reaction to medicines.  Trouble breathing. If this happens, a breathing tube may be used to help with breathing. It will be removed when you are awake and breathing on your own.  Heart trouble.  Lung trouble. What happens before the procedure? Staying hydrated  Follow instructions from your health care provider about hydration, which may include:  Up to 2 hours before the procedure - you may continue to drink clear liquids, such as water, clear fruit juice, black coffee, and plain tea. Eating and drinking restrictions  Follow instructions from your health care provider about eating and drinking, which may include:  8 hours before the procedure - stop eating heavy meals or foods such as meat, fried foods, or fatty foods.  6 hours before the procedure - stop eating light meals or foods, such as toast or cereal.  6 hours before the procedure - stop drinking milk or drinks that contain milk.  2 hours before the procedure - stop drinking clear liquids. Medicine  Ask your health care provider about:  Changing or stopping your regular medicines. This is especially important if you are taking diabetes medicines or blood thinners.  Taking medicines such as aspirin and ibuprofen. These medicines can thin your blood. Do not take these medicines before your procedure if your health care provider instructs you not to. Tests and exams  You will have a physical exam.  You may have blood tests done to show:  How well your kidneys and liver are working.  How well your blood can  clot. General instructions  Plan to have someone take you home from the hospital or clinic.  If you will be going home right after the procedure, plan to have someone with you for 24 hours. What happens during the procedure?  An IV tube will be inserted into one of your veins.  Medicine to help you relax (sedative) will be given through the IV tube.  The medical or dental procedure will be performed. What happens after the procedure?  Your blood pressure, heart rate, breathing rate, and blood oxygen level will be monitored often until the medicines you were given have worn off.  Do not drive for 24 hours. This information is not intended to replace advice given to you by your health care provider. Make sure you discuss any questions you have with your health care provider. Document Released:  09/03/2001 Document Revised: 05/14/2016 Document Reviewed: 03/30/2016 Elsevier Interactive Patient Education  2017 [employer].

## 2016-12-25 ENCOUNTER — Encounter: Payer: Medicare Other | Admitting: Hematology

## 2016-12-31 ENCOUNTER — Encounter: Payer: Self-pay | Admitting: Hematology

## 2016-12-31 ENCOUNTER — Ambulatory Visit (INDEPENDENT_AMBULATORY_CARE_PROVIDER_SITE_OTHER): Payer: Medicare Other | Admitting: *Deleted

## 2016-12-31 ENCOUNTER — Ambulatory Visit (HOSPITAL_BASED_OUTPATIENT_CLINIC_OR_DEPARTMENT_OTHER): Payer: Medicare Other | Admitting: Hematology

## 2016-12-31 VITALS — BP 126/68 | HR 79 | Temp 97.6°F | Resp 18 | Wt 162.3 lb

## 2016-12-31 DIAGNOSIS — I639 Cerebral infarction, unspecified: Secondary | ICD-10-CM

## 2016-12-31 DIAGNOSIS — E119 Type 2 diabetes mellitus without complications: Secondary | ICD-10-CM

## 2016-12-31 DIAGNOSIS — Z8673 Personal history of transient ischemic attack (TIA), and cerebral infarction without residual deficits: Secondary | ICD-10-CM

## 2016-12-31 DIAGNOSIS — C9000 Multiple myeloma not having achieved remission: Secondary | ICD-10-CM | POA: Diagnosis not present

## 2016-12-31 DIAGNOSIS — I10 Essential (primary) hypertension: Secondary | ICD-10-CM | POA: Diagnosis not present

## 2016-12-31 NOTE — Progress Notes (Signed)
[name] Kitchen    HEMATOLOGY/ONCOLOGY CLINIC NOTE  Date of Service: .12/10/2016  Patient Care Team: Mayra Neer, MD as PCP - General (Family Medicine)  CHIEF COMPLAINTS/PURPOSE OF CONSULTATION:   F/u concern for Plasma cell neoplasm/Multiple myeloma  HISTORY OF PRESENTING ILLNESS:   Justin [name] is a wonderful 76 y.o. 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  Patient has a history of hypertension, diabetes, GERD, gout, squamous cell skin cancers, elevated PSA, SVT status post ablation in May 2016, chronic back pain who has a history of recurrent CVA/TIA's. He apparently had a left hemispheric TIA June 2017 and has had recurrent) subcortical infarcts thought to be related to small vessel disease. He was noted to have a small PFO that was of questionable significance. He was initially on aspirin and then continued and aspirin + plavix for secondary stroke prevention. He follows with Dr. Antony Contras for his neurology cares.  An SPEP was done due to elevated total proteins of 8.9 and showed an M spike of 2 g/dL. He was also noted to have an elevated total protein of 8.6 in April 2017. He was referred to [place] for further evaluation of his M spike. Blood test did not reveal any hypercalcemia, no significant renal failure. He has been noted to have developed anemia since June the serum and his hemoglobin was 12.6 and is now down to the mid 10 range. He notes no focal bone pains at this time. Overt acute weight loss. No fevers no chills no night sweats.  INTERVAL HISTORY  Justin [name] is here for follow-up of his lab results. He notes no acute new symptoms. No new focal neurological deficits. Still with some mild left leg weakness. Notes he has issues with bilateral inguinal hernias and is contemplating surgery in the next month or so. We discussed his lab results which are concerning for possible plasma cell neoplasm. We discussed that we would  recommend a bone marrow biopsy and he is agreeable to this.  MEDICAL HISTORY:  Past Medical History:  Diagnosis Date  . 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 Diabetes mellitus without complication ([place])   . GERD (gastroesophageal reflux disease)   . Gout   . History of loop recorder   . Hypertension   . Left leg weakness   . Paroxysmal SVT (supraventricular tachycardia) ([place])    a. s/p RFCA on 05/01/15  . Stroke [facility])     SURGICAL HISTORY: Past Surgical History:  Procedure Laterality Date  . BASAL CELL CARCINOMA EXCISION     off of back  . ELECTROPHYSIOLOGIC STUDY N/A 05/01/2015   Procedure: SVT Ablation;  Surgeon: Evans Lance, MD;  Location: [place] [facility];  Service: Cardiovascular;  Laterality: N/A;  . EP IMPLANTABLE DEVICE N/A 06/04/2016   Procedure: Loop Recorder Insertion;  Surgeon: Thompson Grayer, MD;  Location: [place] [facility];  Service: Cardiovascular;  Laterality: N/A;  . SQUAMOUS CELL CARCINOMA EXCISION    . TEE WITHOUT CARDIOVERSION N/A 06/04/2016   Procedure: TRANSESOPHAGEAL ECHOCARDIOGRAM (TEE);  Surgeon: Pixie Casino, MD;  Location[facility] ENDOSCOPY;  Service: Cardiovascular;  Laterality: N/A;    SOCIAL HISTORY: Social History   Social History  . Marital status: Married    Spouse name: N/A  . Number of children: N/A  . Years of education: N/A   Occupational History  . Not on file.   Social History  Main Topics  . Smoking status: Never Smoker  . Smokeless tobacco: Never Used  . Alcohol use No  . Drug use: No  . Sexual activity: Not on file   Other Topics Concern  . Not on file   Social History Narrative  . No narrative on file    FAMILY HISTORY: Family History  Problem Relation Age of Onset  . Stroke Mother   . Hypertension Mother   . Alzheimer's disease Father   . Heart failure Father   . Down syndrome Daughter     ALLERGIES:  has No Known Allergies.  MEDICATIONS:  Current Outpatient Prescriptions    Medication Sig Dispense Refill  . allopurinol (ZYLOPRIM) 300 MG tablet Take 300 mg by mouth.    [name] Kitchen aspirin 325 MG tablet Take 1 tablet (325 mg total) by mouth daily. 90 tablet 0  . atorvastatin (LIPITOR) 40 MG tablet Take 1 tablet (40 mg total) by mouth every morning. 90 tablet 3  . clopidogrel (PLAVIX) 75 MG tablet Take 1 tablet (75 mg total) by mouth daily. 90 tablet 3  . fluorouracil (EFUDEX) 5 % cream Apply 1 application topically daily as needed (for skin cancer prevention).   0  . metFORMIN (GLUCOPHAGE) 500 MG tablet Take 2 tablets (1,000 mg total) by mouth 2 (two) times daily with a meal. (Patient taking differently: Take by mouth daily with breakfast. ) 60 tablet 3  . nitroGLYCERIN (NITROSTAT) 0.4 MG SL tablet Place 1 tablet (0.4 mg total) under the tongue every 5 (five) minutes as needed for chest pain. 25 tablet 3  . omeprazole (PRILOSEC) 20 MG capsule Take 20 mg by mouth every morning.     [name] Kitchen telmisartan-hydrochlorothiazide (MICARDIS HCT) 80-12.5 MG tablet TAKE ONE-HALF (1/2) TABLET DAILY (DISCARD REMAINDER AFTER OPENING). (Patient taking differently: Take 1 tablet by mouth daily. TAKE ONETABLET DAILY) 90 tablet 1   No current facility-administered medications for this visit.     REVIEW OF SYSTEMS:    10 Point review of Systems was done is negative except as noted above.  PHYSICAL EXAMINATION: ECOG PERFORMANCE STATUS: 2 - Symptomatic, <50% confined to bed  . Vitals:   12/10/16 1110  BP: 113/68  Pulse: 80  Resp: 18  Temp: 97.9 F (36.6 C)   Filed Weights   12/10/16 1110  Weight: 163 lb 9.6 oz (74.2 kg)   .Body mass index is 24.16 kg/m.  GENERAL:alert, in no acute distress and comfortable SKIN: skin color, texture, turgor are normal, no rashes or significant lesions EYES: normal, conjunctiva are pink and non-injected, sclera clear OROPHARYNX:no exudate, no erythema and lips, buccal mucosa, and tongue normal  NECK: supple, no JVD, thyroid normal size, non-tender,  without nodularity LYMPH:  no palpable lymphadenopathy in the cervical, axillary or inguinal LUNGS: clear to auscultation with normal respiratory effort HEART: regular rate & rhythm,  no murmurs and no lower extremity edema ABDOMEN: abdomen soft, non-tender, normoactive bowel sounds  Musculoskeletal: no cyanosis of digits and no clubbing  PSYCH: alert & oriented x 3 with fluent speech NEURO: no focal motor/sensory deficits  LABORATORY DATA:  I have reviewed the data as listed  Component     Latest Ref Rng & Units 11/20/2016  WBC     4.0 - 10.3 10e3/uL 2.8 (L)  NEUT#     1.5 - 6.5 10e3/uL 1.5  Hemoglobin     13.0 - 17.1 g/dL 10.5 (L)  HCT     38.4 - 49.9 % 32.8 (L)  Platelets  140 - 400 10e3/uL 160  MCV     79.3 - 98.0 fL 89.1  MCH     27.2 - 33.4 pg 28.5  MCHC     32.0 - 36.0 g/dL 32.0  RBC     4.20 - 5.82 10e6/uL 3.68 (L)  RDW     11.0 - 14.6 % 17.1 (H)  lymph#     0.9 - 3.3 10e3/uL 0.8 (L)  MONO#     0.1 - 0.9 10e3/uL 0.2  Eosinophils Absolute     0.0 - 0.5 10e3/uL 0.2  Basophils Absolute     0.0 - 0.1 10e3/uL 0.0  NEUT%     39.0 - 75.0 % 53.7  LYMPH%     14.0 - 49.0 % 30.1  MONO%     0.0 - 14.0 % 8.6  EOS%     0.0 - 7.0 % 6.5  BASO%     0.0 - 2.0 % 1.1  Retic %     0.80 - 1.80 % 0.47 (L)  Retic Ct Abs     34.80 - 93.90 10e3/uL 17.30 (L)  Immature Retic Fract     3.00 - 10.60 % 2.50 (L)  Sodium     136 - 145 mEq/L 141  Potassium     3.5 - 5.1 mEq/L 3.9  Chloride     98 - 109 mEq/L 105  CO2     22 - 29 mEq/L 26  Glucose     70 - 140 mg/dl 111  BUN     7.0 - 26.0 mg/dL 23.8  Creatinine     0.7 - 1.3 mg/dL 1.0  Total Bilirubin     0.20 - 1.20 mg/dL 0.56  Alkaline Phosphatase     40 - 150 U/L 75  AST     5 - 34 U/L 17  ALT     0 - 55 U/L 6  Total Protein     6.4 - 8.3 g/dL 9.1 (H)  Albumin     3.5 - 5.0 g/dL 4.0  Calcium     8.4 - 10.4 mg/dL 9.7  Anion gap     3 - 11 mEq/L 10  EGFR     >90 ml/min/1.73 m2 70 (L)  IgG  (Immunoglobin G), Serum     700 - 1600 mg/dL 2,845 (H)  IgA/Immunoglobulin A, Serum     61 - 437 mg/dL 96  IgM, Qn, Serum     15 - 143 mg/dL 37  Total Protein     6.0 - 8.5 g/dL 8.7 (H)  Albumin SerPl Elph-Mcnc     2.9 - 4.4 g/dL 4.1  Alpha 1     0.0 - 0.4 g/dL 0.3  Alpha2 Glob SerPl Elph-Mcnc     0.4 - 1.0 g/dL 0.7  B-Globulin SerPl Elph-Mcnc     0.7 - 1.3 g/dL 0.9  Gamma Glob SerPl Elph-Mcnc     0.4 - 1.8 g/dL 2.7 (H)  M Protein SerPl Elph-Mcnc     Not Observed g/dL 2.1 (H)  Globulin, Total     2.2 - 3.9 g/dL 4.6 (H)  Albumin/Glob SerPl     0.7 - 1.7 0.9  IFE 1      Comment  Please Note (HCV):      Comment  Protein Urine Random     Not Estab. mg/dL   Prot,24hr calculated     30 - 150 mg/24 hr   ALBUMIN, U     %  Alpha-1-Globulin, U     %   ALPHA-2-GLOBULIN, U     %   Beta Globulin, U     %   Gamma Globulin, U     %   M-spike, %     Not Observed %   M-Spike, mg/24 hr     Not Observed mg/24 hr   PLEASE NOTE:        Ig Kappa Free Light Chain     3.3 - 19.4 mg/L 15.6  Ig Lambda Free Light Chain     5.7 - 26.3 mg/L 203.2 (H)  Kappa/Lambda FluidC Ratio     0.26 - 1.65 0.08 (L)  LDH     125 - 245 U/L 117 (L)  Beta 2     0.6 - 2.4 mg/L 2.7 (H)   Multiple Myeloma Panel (SPEP&IFE w/QIG)      No reference range information available      Comments:           Immunofixation shows IgG monoclonal protein with lambda            light chain specificity.  RADIOGRAPHIC STUDIES: I have personally reviewed the radiological images as listed and agreed with the findings in the report.  DG Bone Survey Met (Accession [id number]) (Order [id number])  Imaging  Date: 11/28/2016 Department: [facility]-RAD[facility] Released By: Pricilla Riffle Authorizing: Brunetta Genera, MD  Exam Information   Status Exam Begun  Exam Ended   Final [99] 11/28/2016 11:59 AM 11/28/2016 12:36 PM  PACS Images   Show images for DG Bone Survey Met  Study  Result   CLINICAL DATA:  Monoclonal gammopathy of unknown significance. History of squamous cell carcinoma excision, basal cell carcinoma excision.  EXAM: METASTATIC BONE SURVEY  COMPARISON:  CT neck, chest, abdomen and pelvis from 10/29/16, MRI of the head from 06/01/2016, CXR 10/27/2016, lumbar spine radiographs 06/24/2016  FINDINGS: Lateral skull: Small occipital lucency which may represent a normal arachnoid granulation posteriorly in the occiput. No definite lytic abnormality.  Cervical spine AP and lateral: Disc space narrowing at C2-3, and from C4 through C7. C4-5, C5-6 and C6-7 uncovertebral joint spurring bilaterally. No lytic abnormality.  Thoracic spine AP and lateral: T8-9 right-sided osteophytes and to a lesser degree T9-10. No lytic abnormality. Slight multilevel lumbar thoracic disc space narrowing likely degenerative.  Lumbar spine AP and lateral: Disc space narrowing at L4-5 and to a greater extent L5-S1. No lytic disease. L3 through S1 facet sclerosis.  AP pelvis: Negative for lytic disease.  Bilateral upper and lower extremities shoulders through wrist and from the hips through ankle: Negative for lytic disease. Joint space narrowing of the femorotibial compartments both knees. Subchondral cyst of the right patella.  CXR: Clear lungs. Cardiac implantable monitoring device projects over the left heart. Aortic atherosclerosis. No lytic disease.  IMPRESSION: No findings suspicious for lytic disease.   Electronically Signed   By: Ashley Royalty M.D.   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 (>2g/dl lower that lower limit of normal which is 13) No overt renal failure or hypercalcemia at this time No focal bone pains though he has significant chronic back pain related to degenerative disc disease. Bone survey shows no overt lytic lesions. Myeloma panel showed 2.1 g/dL monoclonal protein spike, UPEP also shows some monoclonal protein, significantly abnormal kappa/Lambda Ratio with clear predominance of lambda serum free light chains.  PLAN -Discussed lab findings and concerns for plasma cell dyscrasia  -recommended Bone marrow examination and with patient consent CT guided BM BX was setup. -ordered and check iron profile and B12 levels to r/o other causes of anemia.  2) recurrent CVA - thought to be related to small vessel disease as per neurology. Has a small PFO which could be an additional risk factor. -on ASA + Plavix as per neurology  3) Patient Active Problem List   Diagnosis Date Noted  . Stroke (cerebrum) ([place]) 10/28/2016  . Gait abnormality   . History of CVA (cerebrovascular accident)   . History of TIA (transient ischemic attack)   . Benign essential HTN   . Paroxysmal SVT (supraventricular tachycardia) ([place])   . Acute blood loss anemia   . Acute ischemic stroke ([place])   . CVA (cerebral vascular accident) ([place]) 10/26/2016  . History of recent stroke 06/06/2016  . 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 TIA (transient ischemic attack) 06/02/2016  . Numbness 06/01/2016  . Right arm numbness 06/01/2016  . CAD (coronary artery disease), native coronary artery 03/20/2016  . Hypertension   . GERD (gastroesophageal reflux disease)   . Gout   . S/P RF ablation operation for arrhythmia 12/13/2014  . Hyperlipidemia 12/13/2014  . Diabetes mellitus ([place]) 12/07/2014   PLAN -Continue management of multiple other medical comorbidities as per primary care  physician.   Labs today CT guided bone marrow biopsy in 2 weeks RTC with Dr Irene Limbo about 4-5 days after Bone marrow biopsy   All of the patients questions were answered with apparent satisfaction. The patient knows to call the clinic with any problems, questions or concerns.  I spent 20 minutes counseling the patient face to face. The total time spent in the appointment was 25 minutes and more than 50% was on counseling and direct patient cares.    Sullivan Lone MD [place] AAHIVMS [facility] Gastroenterology Hematology/Oncology Physician [facility]  (Office):       [phone] (Work cell):  [phone] (Fax):           [phone]

## 2016-12-31 NOTE — Progress Notes (Signed)
[name] Kitchen    HEMATOLOGY/ONCOLOGY CONSULTATION NOTE  Date of Service: .11/20/2016  Patient Care Team: Mayra Neer, MD as PCP - General (Family Medicine)  CHIEF COMPLAINTS/PURPOSE OF CONSULTATION:  "M spike"  HISTORY OF PRESENTING ILLNESS:   Justin [name] is a wonderful 76 y.o. 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  Patient has a history of hypertension, diabetes, GERD, gout, squamous cell skin cancers, elevated PSA, SVT status post ablation in May 2016, chronic back pain who has a history of recurrent CVA/TIA's. He apparently had a left hemispheric TIA June 2017 and has had recurrent) subcortical infarcts thought to be related to small vessel disease. He was noted to have a small PFO that was of questionable significance. He was initially on aspirin and then continued and aspirin + plavix for secondary stroke prevention. He follows with Dr. Antony Contras for his neurology cares.  An SPEP was done due to elevated total proteins of 8.9 and showed an M spike of 2 g/dL. He was also noted to have an elevated total protein of 8.6 in April 2017. He was referred to [place] for further evaluation of his M spike. Blood test did not reveal any hypercalcemia, no significant renal failure. He has been noted to have developed anemia since June the serum and his hemoglobin was 12.6 and is now down to the mid 10 range. He notes no focal bone pains at this time. Overt acute weight loss. No fevers no chills no night sweats.   MEDICAL HISTORY:  Past Medical History:  Diagnosis Date  . 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 Diabetes mellitus without complication ([place])   . GERD (gastroesophageal reflux disease)   . Gout   . History of loop recorder   . Hypertension   . Left leg weakness   . Paroxysmal SVT (supraventricular tachycardia) ([place])    a. s/p RFCA on 05/01/15  . Stroke [facility])     SURGICAL HISTORY: Past  Surgical History:  Procedure Laterality Date  . BASAL CELL CARCINOMA EXCISION     off of back  . ELECTROPHYSIOLOGIC STUDY N/A 05/01/2015   Procedure: SVT Ablation;  Surgeon: Evans Lance, MD;  Location: [place] [facility];  Service: Cardiovascular;  Laterality: N/A;  . EP IMPLANTABLE DEVICE N/A 06/04/2016   Procedure: Loop Recorder Insertion;  Surgeon: Thompson Grayer, MD;  Location: [place] [facility];  Service: Cardiovascular;  Laterality: N/A;  . SQUAMOUS CELL CARCINOMA EXCISION    . TEE WITHOUT CARDIOVERSION N/A 06/04/2016   Procedure: TRANSESOPHAGEAL ECHOCARDIOGRAM (TEE);  Surgeon: Pixie Casino, MD;  Location: [facility] ENDOSCOPY;  Service: Cardiovascular;  Laterality: N/A;    SOCIAL HISTORY: Social History   Social History  . Marital status: Married    Spouse name: N/A  . Number of children: N/A  . Years of education: N/A   Occupational History  . Not on file.   Social History Main Topics  . Smoking status: Never Smoker  . Smokeless tobacco: Never Used  . Alcohol use No  . Drug use: No  . Sexual activity: Not on file   Other Topics Concern  . Not on file   Social History Narrative  . No narrative on file    FAMILY HISTORY: Family History  Problem Relation Age of Onset  . Stroke Mother   . Hypertension Mother   . Alzheimer's disease Father   . Heart  failure Father   . Down syndrome Daughter     ALLERGIES:  has No Known Allergies.  MEDICATIONS:  Current Outpatient Prescriptions  Medication Sig Dispense Refill  . allopurinol (ZYLOPRIM) 300 MG tablet Take 300 mg by mouth.    [name] Kitchen aspirin 325 MG tablet Take 1 tablet (325 mg total) by mouth daily. 90 tablet 0  . atorvastatin (LIPITOR) 40 MG tablet Take 1 tablet (40 mg total) by mouth every morning. 90 tablet 3  . clopidogrel (PLAVIX) 75 MG tablet Take 1 tablet (75 mg total) by mouth daily. 90 tablet 3  . fluorouracil (EFUDEX) 5 % cream Apply 1 application topically daily as needed (for skin cancer prevention).   0  .  metFORMIN (GLUCOPHAGE) 500 MG tablet Take 2 tablets (1,000 mg total) by mouth 2 (two) times daily with a meal. (Patient taking differently: Take by mouth daily with breakfast. ) 60 tablet 3  . nitroGLYCERIN (NITROSTAT) 0.4 MG SL tablet Place 1 tablet (0.4 mg total) under the tongue every 5 (five) minutes as needed for chest pain. 25 tablet 3  . omeprazole (PRILOSEC) 20 MG capsule Take 20 mg by mouth every morning.     [name] Kitchen telmisartan-hydrochlorothiazide (MICARDIS HCT) 80-12.5 MG tablet TAKE ONE-HALF (1/2) TABLET DAILY (DISCARD REMAINDER AFTER OPENING). (Patient taking differently: Take 1 tablet by mouth daily. TAKE ONE-HALF (1/2) TABLET DAILY (DISCARD REMAINDER AFTER OPENING).) 90 tablet 1   No current facility-administered medications for this visit.     REVIEW OF SYSTEMS:    10 Point review of Systems was done is negative except as noted above.  PHYSICAL EXAMINATION: ECOG PERFORMANCE STATUS: 2 - Symptomatic, <50% confined to bed  . Vitals:   11/20/16 1055  BP: 125/78  Pulse: 66  Resp: 18  Temp: 97.6 F (36.4 C)   Filed Weights   11/20/16 1055  Weight: 161 lb (73 kg)   .Body mass index is 23.78 kg/m.  GENERAL:alert, in no acute distress and comfortable SKIN: skin color, texture, turgor are normal, no rashes or significant lesions EYES: normal, conjunctiva are pink and non-injected, sclera clear OROPHARYNX:no exudate, no erythema and lips, buccal mucosa, and tongue normal  NECK: supple, no JVD, thyroid normal size, non-tender, without nodularity LYMPH:  no palpable lymphadenopathy in the cervical, axillary or inguinal LUNGS: clear to auscultation with normal respiratory effort HEART: regular rate & rhythm,  no murmurs and no lower extremity edema ABDOMEN: abdomen soft, non-tender, normoactive bowel sounds  Musculoskeletal: no cyanosis of digits and no clubbing  PSYCH: alert & oriented x 3 with fluent speech NEURO: no focal motor/sensory deficits  LABORATORY DATA:  I have  reviewed the data as listed  Component     Latest Ref Rng & Units 11/20/2016  WBC     4.0 - 10.3 10e3/uL 2.8 (L)  NEUT#     1.5 - 6.5 10e3/uL 1.5  Hemoglobin     13.0 - 17.1 g/dL 10.5 (L)  HCT     38.4 - 49.9 % 32.8 (L)  Platelets     140 - 400 10e3/uL 160  MCV     79.3 - 98.0 fL 89.1  MCH     27.2 - 33.4 pg 28.5  MCHC     32.0 - 36.0 g/dL 32.0  RBC     4.20 - 5.82 10e6/uL 3.68 (L)  RDW     11.0 - 14.6 % 17.1 (H)  lymph#     0.9 - 3.3 10e3/uL 0.8 (L)  MONO#  0.1 - 0.9 10e3/uL 0.2  Eosinophils Absolute     0.0 - 0.5 10e3/uL 0.2  Basophils Absolute     0.0 - 0.1 10e3/uL 0.0  NEUT%     39.0 - 75.0 % 53.7  LYMPH%     14.0 - 49.0 % 30.1  MONO%     0.0 - 14.0 % 8.6  EOS%     0.0 - 7.0 % 6.5  BASO%     0.0 - 2.0 % 1.1  Retic %     0.80 - 1.80 % 0.47 (L)  Retic Ct Abs     34.80 - 93.90 10e3/uL 17.30 (L)  Immature Retic Fract     3.00 - 10.60 % 2.50 (L)  Sodium     136 - 145 mEq/L 141  Potassium     3.5 - 5.1 mEq/L 3.9  Chloride     98 - 109 mEq/L 105  CO2     22 - 29 mEq/L 26  Glucose     70 - 140 mg/dl 111  BUN     7.0 - 26.0 mg/dL 23.8  Creatinine     0.7 - 1.3 mg/dL 1.0  Total Bilirubin     0.20 - 1.20 mg/dL 0.56  Alkaline Phosphatase     40 - 150 U/L 75  AST     5 - 34 U/L 17  ALT     0 - 55 U/L 6  Total Protein     6.4 - 8.3 g/dL 9.1 (H)  Albumin     3.5 - 5.0 g/dL 4.0  Calcium     8.4 - 10.4 mg/dL 9.7  Anion gap     3 - 11 mEq/L 10  EGFR     >90 ml/min/1.73 m2 70 (L)  IgG (Immunoglobin G), Serum     700 - 1600 mg/dL 2,845 (H)  IgA/Immunoglobulin A, Serum     61 - 437 mg/dL 96  IgM, Qn, Serum     15 - 143 mg/dL 37  Total Protein     6.0 - 8.5 g/dL 8.7 (H)  Albumin SerPl Elph-Mcnc     2.9 - 4.4 g/dL 4.1  Alpha 1     0.0 - 0.4 g/dL 0.3  Alpha2 Glob SerPl Elph-Mcnc     0.4 - 1.0 g/dL 0.7  B-Globulin SerPl Elph-Mcnc     0.7 - 1.3 g/dL 0.9  Gamma Glob SerPl Elph-Mcnc     0.4 - 1.8 g/dL 2.7 (H)  M Protein SerPl Elph-Mcnc      Not Observed g/dL 2.1 (H)  Globulin, Total     2.2 - 3.9 g/dL 4.6 (H)  Albumin/Glob SerPl     0.7 - 1.7 0.9  IFE 1      Comment  Please Note (HCV):      Comment  Protein Urine Random     Not Estab. mg/dL   Prot,24hr calculated     30 - 150 mg/24 hr   ALBUMIN, U     %   Alpha-1-Globulin, U     %   ALPHA-2-GLOBULIN, U     %   Beta Globulin, U     %   Gamma Globulin, U     %   M-spike, %     Not Observed %   M-Spike, mg/24 hr     Not Observed mg/24 hr   PLEASE NOTE:        Ig Kappa Free Light Chain  3.3 - 19.4 mg/L 15.6  Ig Lambda Free Light Chain     5.7 - 26.3 mg/L 203.2 (H)  Kappa/Lambda FluidC Ratio     0.26 - 1.65 0.08 (L)  LDH     125 - 245 U/L 117 (L)  Beta 2     0.6 - 2.4 mg/L 2.7 (H)   Multiple Myeloma Panel (SPEP&IFE w/QIG)      No reference range information available      Comments:           Immunofixation shows IgG monoclonal protein with lambda            light chain           specificity.  RADIOGRAPHIC STUDIES: I have personally reviewed the radiological images as listed and agreed with the findings in the report. Ct Biopsy  Result Date: 12/24/2016 INDICATION: Anemia of uncertain etiology. Please perform CT-guided bone marrow biopsy for tissue diagnostic purposes. EXAM: CT-GUIDED BONE MARROW BIOPSY AND ASPIRATION MEDICATIONS: None ANESTHESIA/SEDATION: Fentanyl 50 mcg IV; Versed 3 mg IV Sedation Time: 10 minutes; The patient was continuously monitored during the procedure by the interventional radiology nurse under my direct supervision. COMPLICATIONS: None immediate. PROCEDURE: Informed consent was obtained from the patient following an explanation of the procedure, risks, benefits and alternatives. The patient understands, agrees and consents for the procedure. All questions were addressed. A time out was performed prior to the initiation of the procedure. The patient was positioned prone and non-contrast localization CT was performed of the pelvis to  demonstrate the iliac marrow spaces. The operative site was prepped and draped in the usual sterile fashion. Under sterile conditions and local anesthesia, a 22 gauge spinal needle was utilized for procedural planning. Next, an 11 gauge coaxial bone biopsy needle was advanced into the left iliac marrow space. Needle position was confirmed with CT imaging. Initially, bone marrow aspiration was performed. Next, a bone marrow biopsy was obtained with the 11 gauge outer bone marrow device. Samples were prepared with the cytotechnologist and deemed adequate. The needle was removed intact. Hemostasis was obtained with compression and a dressing was placed. The patient tolerated the procedure well without immediate post procedural complication. IMPRESSION: Successful CT guided left iliac bone marrow aspiration and core biopsy. Electronically Signed   By: Sandi Mariscal M.D.   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 No overt renal failure or hypercalcemia at this time No focal bone pains though he has significant chronic back pain related to degenerative disc disease. PLAN -Discussed the initial lab findings in detail with the patient. -We discussed in detail the spectrum of plasma cell dyscrasias and the recommended workup -We shall get a myeloma workup as noted below. -Whole-body bone survey -We will decide on a bone marrow biopsy based on initial workup results. . Orders Placed This Encounter  Procedures  . DG Bone Survey Met    Standing Status:   Future    Number of Occurrences:   1    Standing Expiration Date:   01/20/2018    Order Specific Question:   Reason for Exam (SYMPTOM  OR DIAGNOSIS REQUIRED)    Answer:   staging myeloma    Order Specific Question:   Preferred imaging location?    Answer:   [facility]    Standing Status:   Future    Number of Occurrences:   1    Standing Expiration Date:   11/20/2017  . Comprehensive metabolic panel    Standing Status:   Future    Number of Occurrences:   1    Standing Expiration Date:   11/20/2017  . Multiple Myeloma Panel (SPEP&IFE w/QIG)    Standing Status:   Future    Number of Occurrences:   1    Standing Expiration Date:   11/20/2017  . Kappa/lambda light chains    Standing Status:   Future    Number of Occurrences:   1    Standing Expiration Date:   12/25/2017  . Lactate dehydrogenase    Standing Status:   Future    Number of Occurrences:   1    Standing Expiration Date:   11/20/2017  . Beta 2 microglobulin    Standing Status:   Future    Number of Occurrences:   1    Standing Expiration Date:   11/20/2017  . 24 Hr Ur UPEP/TP    Standing Status:   Future    Number of Occurrences:   1    Standing Expiration Date:   11/20/2017   2) recurrent CVA - thought to be related to small vessel disease as per neurology. Has a small PFO which could be an additional risk factor. -on ASA + Plavix as per neurology  3) Patient Active Problem List   Diagnosis Date Noted  . Stroke (cerebrum) ([place]) 10/28/2016  . Gait abnormality   . History of CVA (cerebrovascular accident)   . History of TIA (transient ischemic attack)   . Benign essential HTN   . Paroxysmal SVT (supraventricular tachycardia) ([place])   . Acute blood loss anemia   . Acute ischemic stroke ([place])   . CVA (cerebral vascular accident) ([place]) 10/26/2016  . History of recent stroke 06/06/2016  . 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 TIA (transient ischemic attack) 06/02/2016  . Numbness 06/01/2016  . Right arm numbness 06/01/2016  . CAD (coronary artery disease), native coronary artery 03/20/2016  . Hypertension   . GERD (gastroesophageal reflux disease)   . Gout   . S/P RF ablation operation for arrhythmia 12/13/2014  . Hyperlipidemia  12/13/2014  . Diabetes mellitus ([place]) 12/07/2014   PLAN -Continue management of multiple other medical comorbidities as per primary care physician.  Labs today Bone/Skeletal survey in 1 weeks RTC with Dr Irene Limbo in 2-3 weeks to discuss results and plan further evaluation and management.   All of the patients questions were answered with apparent satisfaction. The patient knows to call the clinic with any problems, questions or concerns.  I spent 45 minutes counseling the patient face to face. The total time spent in the appointment was 60 minutes and more than 50% was on counseling and direct patient cares.    Sullivan Lone MD [place] AAHIVMS [facility] Hematology/Oncology Physician [facility]  (Office):       [phone] (Work cell):  [phone] (Fax):           [phone]  12/31/2016 2:47 PM

## 2016-12-31 NOTE — Progress Notes (Signed)
Justin Kitchen    HEMATOLOGY/ONCOLOGY CLINIC NOTE  Date of Service: .12/31/2016  Patient Care Team: Mayra Neer, MD as PCP - General (Family Medicine)  CHIEF COMPLAINTS/PURPOSE OF CONSULTATION:   F/u concern for Plasma cell neoplasm/Multiple myeloma  HISTORY OF PRESENTING ILLNESS:   Justin [name] is a wonderful 76 y.o. 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  Patient has a history of hypertension, diabetes, GERD, gout, squamous cell skin cancers, elevated PSA, SVT status post ablation in May 2016, chronic back pain who has a history of recurrent CVA/TIA's. He apparently had a left hemispheric TIA June 2017 and has had recurrent) subcortical infarcts thought to be related to small vessel disease. He was noted to have a small PFO that was of questionable significance. He was initially on aspirin and then continued and aspirin + plavix for secondary stroke prevention. He follows with Dr. Antony Contras for his neurology cares.  An SPEP was done due to elevated total proteins of 8.9 and showed an M spike of 2 g/dL. He was also noted to have an elevated total protein of 8.6 in April 2017. He was referred to [place] for further evaluation of his M spike. Blood test did not reveal any hypercalcemia, no significant renal failure. He has been noted to have developed anemia since June the serum and his hemoglobin was 12.6 and is now down to the mid 10 range. He notes no focal bone pains at this time. Overt acute weight loss. No fevers no chills no night sweats.  INTERVAL HISTORY  Justin [name] is here for follow-up of his bone marrow biopsy results. His BM Bx shows 20% clonal Lambda predominant plasma cells consistent with a plasma cell neoplasm. He notes BM Bx was easier than anticipated. No other acute new symptoms. We spent a significant period of time discussion the results, diagnosis, prognosis, treatment options. He notes that his inguinal hernias  have been manageable with his compression belt. He notes that he is scheduled for hernia surgery with Dr Kae Heller -[facility] on 01/17/2017. He continues to be on ASA and plavix for his recurrent CVA's. Notes no afib noted on event monitor as of now.  MEDICAL HISTORY:  Past Medical History:  Diagnosis Date  . 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 Diabetes mellitus without complication ([place])   . GERD (gastroesophageal reflux disease)   . Gout   . History of loop recorder   . Hypertension   . Left leg weakness   . Paroxysmal SVT (supraventricular tachycardia) ([place])    a. s/p RFCA on 05/01/15  . Stroke [facility])     SURGICAL HISTORY: Past Surgical History:  Procedure Laterality Date  . BASAL CELL CARCINOMA EXCISION     off of back  . ELECTROPHYSIOLOGIC STUDY N/A 05/01/2015   Procedure: SVT Ablation;  Surgeon: Evans Lance, MD;  Location: [facility];  Service: Cardiovascular;  Laterality: N/A;  . EP IMPLANTABLE DEVICE N/A 06/04/2016   Procedure: Loop Recorder Insertion;  Surgeon: Thompson Grayer, MD;  Location: [facility];  Service: Cardiovascular;  Laterality: N/A;  . SQUAMOUS CELL CARCINOMA EXCISION    . TEE WITHOUT CARDIOVERSION N/A 06/04/2016   Procedure: TRANSESOPHAGEAL ECHOCARDIOGRAM (TEE);  Surgeon: Pixie Casino, MD;  Location: [facility] ENDOSCOPY;  Service: Cardiovascular;  Laterality: N/A;    SOCIAL HISTORY: Social History   Social History  . Marital status: Married  Spouse name: N/A  . Number of children: N/A  . Years of education: N/A   Occupational History  . Not on file.   Social History Main Topics  . Smoking status: Never Smoker  . Smokeless tobacco: Never Used  . Alcohol use No  . Drug use: No  . Sexual activity: Not on file   Other Topics Concern  . Not on file   Social History Narrative  . No narrative on file    FAMILY HISTORY: Family History  Problem Relation Age of Onset  . Stroke Mother   . Hypertension Mother    . Alzheimer's disease Father   . Heart failure Father   . Down syndrome Daughter     ALLERGIES:  has No Known Allergies.  MEDICATIONS:  Current Outpatient Prescriptions  Medication Sig Dispense Refill  . allopurinol (ZYLOPRIM) 300 MG tablet Take 300 mg by mouth.    Justin Kitchen aspirin 325 MG tablet Take 1 tablet (325 mg total) by mouth daily. 90 tablet 0  . atorvastatin (LIPITOR) 40 MG tablet Take 1 tablet (40 mg total) by mouth every morning. 90 tablet 3  . clopidogrel (PLAVIX) 75 MG tablet Take 1 tablet (75 mg total) by mouth daily. 90 tablet 3  . fluorouracil (EFUDEX) 5 % cream Apply 1 application topically daily as needed (for skin cancer prevention).   0  . metFORMIN (GLUCOPHAGE) 500 MG tablet Take 2 tablets (1,000 mg total) by mouth 2 (two) times daily with a meal. (Patient taking differently: Take by mouth daily with breakfast. ) 60 tablet 3  . nitroGLYCERIN (NITROSTAT) 0.4 MG SL tablet Place 1 tablet (0.4 mg total) under the tongue every 5 (five) minutes as needed for chest pain. 25 tablet 3  . omeprazole (PRILOSEC) 20 MG capsule Take 20 mg by mouth every morning.     Justin Kitchen telmisartan-hydrochlorothiazide (MICARDIS HCT) 80-12.5 MG tablet TAKE ONE-HALF (1/2) TABLET DAILY (DISCARD REMAINDER AFTER OPENING). (Patient taking differently: Take 1 tablet by mouth daily. TAKE ONETABLET DAILY) 90 tablet 1   No current facility-administered medications for this visit.     REVIEW OF SYSTEMS:    10 Point review of Systems was done is negative except as noted above.  PHYSICAL EXAMINATION: ECOG PERFORMANCE STATUS: 2 - Symptomatic, <50% confined to bed  . Vitals:   12/31/16 1513  BP: 126/68  Pulse: 79  Resp: 18  Temp: 97.6 F (36.4 C)   Filed Weights   12/31/16 1513  Weight: 162 lb 5 oz (73.6 kg)   .Body mass index is 23.97 kg/m.  GENERAL:alert, in no acute distress and comfortable SKIN: skin color, texture, turgor are normal, no rashes or significant lesions EYES: normal, conjunctiva are  pink and non-injected, sclera clear OROPHARYNX:no exudate, no erythema and lips, buccal mucosa, and tongue normal  NECK: supple, no JVD, thyroid normal size, non-tender, without nodularity LYMPH:  no palpable lymphadenopathy in the cervical, axillary or inguinal LUNGS: clear to auscultation with normal respiratory effort HEART: regular rate & rhythm,  no murmurs and no lower extremity edema ABDOMEN: abdomen soft, non-tender, normoactive bowel sounds  Musculoskeletal: no cyanosis of digits and no clubbing  PSYCH: alert & oriented x 3 with fluent speech NEURO: no focal motor/sensory deficits  LABORATORY DATA:  I have reviewed the data as listed . CBC Latest Ref Rng & Units 12/24/2016 12/10/2016 11/20/2016  WBC 4.0 - 10.5 K/uL 3.2(L) 3.8(L) 2.8(L)  Hemoglobin 13.0 - 17.0 g/dL 11.2(L) 10.2(L) 10.5(L)  Hematocrit 39.0 - 52.0 % 33.8(L) 31.4(L)  32.8(L)  Platelets 150 - 400 K/uL 221 243 160   . CMP Latest Ref Rng & Units 11/20/2016 11/20/2016 10/27/2016  Glucose 70 - 140 mg/dl 111 - 127(H)  BUN 7.0 - 26.0 mg/dL 23.8 - 25(H)  Creatinine 0.7 - 1.3 mg/dL 1.0 - 1.00  Sodium 136 - 145 mEq/L 141 - 137  Potassium 3.5 - 5.1 mEq/L 3.9 - 4.2  Chloride 101 - 111 mmol/L - - 105  CO2 22 - 29 mEq/L 26 - 24  Calcium 8.4 - 10.4 mg/dL 9.7 - 8.6(L)  Total Protein 6.4 - 8.3 g/dL 9.1(H) 8.7(H) -  Total Bilirubin 0.20 - 1.20 mg/dL 0.56 - -  Alkaline Phos 40 - 150 U/L 75 - -  AST 5 - 34 U/L 17 - -  ALT 0 - 55 U/L 6 - -    Component     Latest Ref Rng & Units 11/20/2016  WBC     4.0 - 10.3 10e3/uL 2.8 (L)  NEUT#     1.5 - 6.5 10e3/uL 1.5  Hemoglobin     13.0 - 17.1 g/dL 10.5 (L)  HCT     38.4 - 49.9 % 32.8 (L)  Platelets     140 - 400 10e3/uL 160  MCV     79.3 - 98.0 fL 89.1  MCH     27.2 - 33.4 pg 28.5  MCHC     32.0 - 36.0 g/dL 32.0  RBC     4.20 - 5.82 10e6/uL 3.68 (L)  RDW     11.0 - 14.6 % 17.1 (H)  lymph#     0.9 - 3.3 10e3/uL 0.8 (L)  MONO#     0.1 - 0.9 10e3/uL 0.2  Eosinophils  Absolute     0.0 - 0.5 10e3/uL 0.2  Basophils Absolute     0.0 - 0.1 10e3/uL 0.0  NEUT%     39.0 - 75.0 % 53.7  LYMPH%     14.0 - 49.0 % 30.1  MONO%     0.0 - 14.0 % 8.6  EOS%     0.0 - 7.0 % 6.5  BASO%     0.0 - 2.0 % 1.1  Retic %     0.80 - 1.80 % 0.47 (L)  Retic Ct Abs     34.80 - 93.90 10e3/uL 17.30 (L)  Immature Retic Fract     3.00 - 10.60 % 2.50 (L)  Sodium     136 - 145 mEq/L 141  Potassium     3.5 - 5.1 mEq/L 3.9  Chloride     98 - 109 mEq/L 105  CO2     22 - 29 mEq/L 26  Glucose     70 - 140 mg/dl 111  BUN     7.0 - 26.0 mg/dL 23.8  Creatinine     0.7 - 1.3 mg/dL 1.0  Total Bilirubin     0.20 - 1.20 mg/dL 0.56  Alkaline Phosphatase     40 - 150 U/L 75  AST     5 - 34 U/L 17  ALT     0 - 55 U/L 6  Total Protein     6.4 - 8.3 g/dL 9.1 (H)  Albumin     3.5 - 5.0 g/dL 4.0  Calcium     8.4 - 10.4 mg/dL 9.7  Anion gap     3 - 11 mEq/L 10  EGFR     >90 ml/min/1.73 m2 70 (L)  IgG (Immunoglobin G),  Serum     700 - 1600 mg/dL 2,845 (H)  IgA/Immunoglobulin A, Serum     61 - 437 mg/dL 96  IgM, Qn, Serum     15 - 143 mg/dL 37  Total Protein     6.0 - 8.5 g/dL 8.7 (H)  Albumin SerPl Elph-Mcnc     2.9 - 4.4 g/dL 4.1  Alpha 1     0.0 - 0.4 g/dL 0.3  Alpha2 Glob SerPl Elph-Mcnc     0.4 - 1.0 g/dL 0.7  B-Globulin SerPl Elph-Mcnc     0.7 - 1.3 g/dL 0.9  Gamma Glob SerPl Elph-Mcnc     0.4 - 1.8 g/dL 2.7 (H)  M Protein SerPl Elph-Mcnc     Not Observed g/dL 2.1 (H)  Globulin, Total     2.2 - 3.9 g/dL 4.6 (H)  Albumin/Glob SerPl     0.7 - 1.7 0.9  IFE 1      Comment  Please Note (HCV):      Comment  Protein Urine Random     Not Estab. mg/dL   Prot,24hr calculated     30 - 150 mg/24 hr   ALBUMIN, U     %   Alpha-1-Globulin, U     %   ALPHA-2-GLOBULIN, U     %   Beta Globulin, U     %   Gamma Globulin, U     %   M-spike, %     Not Observed %   M-Spike, mg/24 hr     Not Observed mg/24 hr   PLEASE NOTE:        Ig Kappa Free Light  Chain     3.3 - 19.4 mg/L 15.6  Ig Lambda Free Light Chain     5.7 - 26.3 mg/L 203.2 (H)  Kappa/Lambda FluidC Ratio     0.26 - 1.65 0.08 (L)  LDH     125 - 245 U/L 117 (L)  Beta 2     0.6 - 2.4 mg/L 2.7 (H)   Multiple Myeloma Panel (SPEP&IFE w/QIG)      No reference range information available      Comments:           Immunofixation shows IgG monoclonal protein with lambda            light chain specificity.   Component     Latest Ref Rng & Units 12/10/2016  Iron     42 - 163 ug/dL 25 (L)  TIBC     202 - 409 ug/dL 205  UIBC     117 - 376 ug/dL 180  %SAT     20 - 55 % 12 (L)  Vitamin B12     232 - 1,245 pg/mL 344  Ferritin     22 - 316 ng/ml 944 (H)      RADIOGRAPHIC STUDIES: I have personally reviewed the radiological images as listed and agreed with the findings in the report.  DG Bone Survey Met (Accession [id number]) (Order [id number])  Imaging  Date: 11/28/2016 Department: [facility] Released By: Pricilla Riffle Authorizing: Brunetta Genera, MD  Exam Information   Status Exam Begun  Exam Ended   Final [99] 11/28/2016 11:59 AM 11/28/2016 12:36 PM  PACS Images   Show images for DG Bone Survey Met  Study Result   CLINICAL DATA:  Monoclonal gammopathy of unknown significance. History of squamous cell carcinoma excision, basal cell carcinoma excision.  EXAM: METASTATIC BONE SURVEY  COMPARISON:  CT neck, chest, abdomen and pelvis from 10/29/16, MRI of the head from 06/01/2016, CXR 10/27/2016, lumbar spine radiographs 06/24/2016  FINDINGS: Lateral skull: Small occipital lucency which may represent a normal arachnoid granulation posteriorly in the occiput. No definite lytic abnormality.  Cervical spine AP and lateral: Disc space narrowing at C2-3, and from C4 through C7. C4-5, C5-6 and C6-7 uncovertebral joint spurring bilaterally. No lytic abnormality.  Thoracic spine AP and lateral: T8-9 right-sided  osteophytes and to a lesser degree T9-10. No lytic abnormality. Slight multilevel lumbar thoracic disc space narrowing likely degenerative.  Lumbar spine AP and lateral: Disc space narrowing at L4-5 and to a greater extent L5-S1. No lytic disease. L3 through S1 facet sclerosis.  AP pelvis: Negative for lytic disease.  Bilateral upper and lower extremities shoulders through wrist and from the hips through ankle: Negative for lytic disease. Joint space narrowing of the femorotibial compartments both knees. Subchondral cyst of the right patella.  CXR: Clear lungs. Cardiac implantable monitoring device projects over the left heart. Aortic atherosclerosis. No lytic disease.  IMPRESSION: No findings suspicious for lytic disease.   Electronically Signed   By: Ashley Royalty M.D.   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 Awaiting CYtogenetics and Myeloma FISH panel. Patient has normocytic anemia without any other clear etiology. (>2g/dl lower that lower limit of normal which is 13) which per criteria would place this in the Multiple myeloma category as opposed to Smoldering Multiple myeloma (Borderline criterion) No overt renal failure or hypercalcemia at this time No focal bone pains though he has significant chronic back pain related to degenerative disc disease. Bone survey shows no overt lytic lesions. Myeloma panel showed 2.1 g/dL monoclonal protein spike, UPEP also shows some monoclonal protein, significantly abnormal kappa/Lambda Ratio with clear predominance of lambda serum free light chains.   PLAN -Discussed lab findings and diagnosis, prognosis and treatment options for Multiple myeloma. -awaiting cytogenetics and myeloma FISH to complete RISS staging. -we discussed that his anemia is borderline which makes his borderline between SMM vs  MM but that he would meet criteria and given his CVA's and anemia (without alternative etiology) we would treatment him as MM -would need rpt labs -patient has poor veins and wants to get all his labs together when he goes to his PCP tomorrow. -we discussed that he would need to discussed with his PCP and surgeon and consider if he absolutely needs to proceed with inguinal hernia surgery at this time (within 6 months of CVA with need to hold plavix and we new diagnosis of MM - not treated at this time). -from a myeloma stand point would recommend r/o coaguloapathy that can sometimes cause uncontrolled bleeding. In addition to PT, PTT would need to check a thrombin time (to ensure paraproteinemia is not interfering with fibrin polymerization).   2) recurrent CVA - thought to be related to small vessel disease as per neurology. Has a small PFO which could be an additional risk factor. -on ASA + Plavix as per neurology (Dr Leonie Man) -would need to be cleared by neurology for upcoming surgery  3) Patient Active Problem List   Diagnosis Date Noted  . Stroke (cerebrum) ([place]) 10/28/2016  . Gait abnormality   . History of CVA (cerebrovascular accident)   . History of TIA (transient ischemic attack)   . Benign essential  HTN   . Paroxysmal SVT (supraventricular tachycardia) (H[facility])   . Acute blood loss anemia   . Acute ischemic stroke ([place])   . CVA (cerebral vascular accident) ([place]) 10/26/2016  . History of recent stroke 06/06/2016  . 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 TIA (transient ischemic attack) 06/02/2016  . Numbness 06/01/2016  . Right arm numbness 06/01/2016  . CAD (coronary artery disease), native coronary artery 03/20/2016  . Hypertension   . GERD (gastroesophageal reflux disease)   . Gout   . S/P RF ablation operation for arrhythmia 12/13/2014  . Hyperlipidemia 12/13/2014  . Diabetes mellitus ([place]) 12/07/2014   PLAN -Continue management of multiple other medical  comorbidities as per primary care physician and will need pre-operative evaluation for hernia surgery.   F/u with PCP tomorrow . Given prescription for labs to be drawn with PCP and faxed to [place].   All of the patients questions were answered with apparent satisfaction. The patient knows to call the clinic with any problems, questions or concerns.  I spent 30 minutes counseling the patient face to face. The total time spent in the appointment was 40 minutes and more than 50% was on counseling and direct patient cares.    Justin Lone MD [place] [facility]AHIVMS [facility] Hematology/Oncology Physician [facility]  (Office):       [phone] (Work cell):  [phone] (Fax):           [phone]

## 2017-01-01 DIAGNOSIS — E1165 Type 2 diabetes mellitus with hyperglycemia: Secondary | ICD-10-CM | POA: Diagnosis not present

## 2017-01-01 DIAGNOSIS — G8190 Hemiplegia, unspecified affecting unspecified side: Secondary | ICD-10-CM | POA: Diagnosis not present

## 2017-01-01 DIAGNOSIS — I10 Essential (primary) hypertension: Secondary | ICD-10-CM | POA: Diagnosis not present

## 2017-01-01 DIAGNOSIS — C9000 Multiple myeloma not having achieved remission: Secondary | ICD-10-CM | POA: Diagnosis not present

## 2017-01-01 DIAGNOSIS — Z8673 Personal history of transient ischemic attack (TIA), and cerebral infarction without residual deficits: Secondary | ICD-10-CM | POA: Diagnosis not present

## 2017-01-01 DIAGNOSIS — K469 Unspecified abdominal hernia without obstruction or gangrene: Secondary | ICD-10-CM | POA: Diagnosis not present

## 2017-01-01 DIAGNOSIS — E1159 Type 2 diabetes mellitus with other circulatory complications: Secondary | ICD-10-CM | POA: Diagnosis not present

## 2017-01-01 NOTE — Progress Notes (Signed)
Carelink Summary Report / Loop Recorder 

## 2017-01-02 LAB — TISSUE HYBRIDIZATION (BONE MARROW)-NCBH

## 2017-01-02 LAB — CHROMOSOME ANALYSIS, BONE MARROW

## 2017-01-06 DIAGNOSIS — Z86018 Personal history of other benign neoplasm: Secondary | ICD-10-CM | POA: Diagnosis not present

## 2017-01-06 DIAGNOSIS — L719 Rosacea, unspecified: Secondary | ICD-10-CM | POA: Diagnosis not present

## 2017-01-06 DIAGNOSIS — Z85828 Personal history of other malignant neoplasm of skin: Secondary | ICD-10-CM | POA: Diagnosis not present

## 2017-01-06 DIAGNOSIS — L821 Other seborrheic keratosis: Secondary | ICD-10-CM | POA: Diagnosis not present

## 2017-01-06 DIAGNOSIS — D1800 Hemangioma unspecified site: Secondary | ICD-10-CM | POA: Diagnosis not present

## 2017-01-06 DIAGNOSIS — L570 Actinic keratosis: Secondary | ICD-10-CM | POA: Diagnosis not present

## 2017-01-06 DIAGNOSIS — L814 Other melanin hyperpigmentation: Secondary | ICD-10-CM | POA: Diagnosis not present

## 2017-01-13 ENCOUNTER — Other Ambulatory Visit (HOSPITAL_COMMUNITY): Payer: Medicare Other

## 2017-01-17 ENCOUNTER — Ambulatory Visit: Admit: 2017-01-17 | Payer: Medicare Other | Admitting: Surgery

## 2017-01-17 ENCOUNTER — Telehealth: Payer: Self-pay | Admitting: Hematology

## 2017-01-17 SURGERY — REPAIR, HERNIA, INGUINAL, BILATERAL, LAPAROSCOPIC
Anesthesia: General | Laterality: Bilateral

## 2017-01-17 NOTE — Telephone Encounter (Signed)
FAXED RECORDS TO [facility] °

## 2017-01-23 ENCOUNTER — Telehealth: Payer: Self-pay | Admitting: Hematology

## 2017-01-23 LAB — CUP PACEART REMOTE DEVICE CHECK
Implantable Pulse Generator Implant Date: 20170613
MDC IDC SESS DTM: 20171211003848

## 2017-01-23 NOTE — Telephone Encounter (Signed)
sw pt to confirm 2/12 appt at 3 pm per LOS

## 2017-01-23 NOTE — Progress Notes (Signed)
Carelink summary report received. Battery status OK. Normal device function. No new symptom episodes, tachy episodes, brady, or pause episodes. No new AF episodes. Monthly summary reports and ROV/PRN 

## 2017-01-30 ENCOUNTER — Telehealth: Payer: Self-pay | Admitting: *Deleted

## 2017-01-30 ENCOUNTER — Ambulatory Visit (INDEPENDENT_AMBULATORY_CARE_PROVIDER_SITE_OTHER): Payer: Medicare Other | Admitting: *Deleted

## 2017-01-30 DIAGNOSIS — I639 Cerebral infarction, unspecified: Secondary | ICD-10-CM

## 2017-01-30 NOTE — Telephone Encounter (Signed)
"  I will see Dr.kale Monday.  Need verification Dr. Irene Limbo has received labs from Dr. Brigitte Pulse.  Results were sent last week.  Call me at return number [phone] to confirm.  I have a hard copy if he has not."

## 2017-01-30 NOTE — Telephone Encounter (Signed)
Left voicemail informing patient that we have labs for upcoming appointment.

## 2017-01-31 NOTE — Progress Notes (Signed)
Carelink Summary Report / Loop Recorder 

## 2017-02-03 ENCOUNTER — Other Ambulatory Visit (HOSPITAL_BASED_OUTPATIENT_CLINIC_OR_DEPARTMENT_OTHER): Payer: Medicare Other

## 2017-02-03 ENCOUNTER — Encounter: Payer: Self-pay | Admitting: Hematology

## 2017-02-03 ENCOUNTER — Other Ambulatory Visit: Payer: Self-pay | Admitting: *Deleted

## 2017-02-03 ENCOUNTER — Telehealth: Payer: Self-pay | Admitting: Hematology

## 2017-02-03 ENCOUNTER — Ambulatory Visit (HOSPITAL_BASED_OUTPATIENT_CLINIC_OR_DEPARTMENT_OTHER): Payer: Medicare Other | Admitting: Hematology

## 2017-02-03 VITALS — BP 141/93 | HR 77 | Temp 98.3°F | Resp 18 | Ht 69.0 in | Wt 166.1 lb

## 2017-02-03 DIAGNOSIS — C9000 Multiple myeloma not having achieved remission: Secondary | ICD-10-CM

## 2017-02-03 DIAGNOSIS — I639 Cerebral infarction, unspecified: Secondary | ICD-10-CM | POA: Diagnosis not present

## 2017-02-03 DIAGNOSIS — D472 Monoclonal gammopathy: Secondary | ICD-10-CM

## 2017-02-03 LAB — CBC WITH DIFFERENTIAL/PLATELET
BASO%: 0.3 % (ref 0.0–2.0)
BASOS ABS: 0 10*3/uL (ref 0.0–0.1)
EOS ABS: 0.1 10*3/uL (ref 0.0–0.5)
EOS%: 3.5 % (ref 0.0–7.0)
HCT: 31.7 % — ABNORMAL LOW (ref 38.4–49.9)
HGB: 10.3 g/dL — ABNORMAL LOW (ref 13.0–17.1)
LYMPH%: 29.7 % (ref 14.0–49.0)
MCH: 28.9 pg (ref 27.2–33.4)
MCHC: 32.5 g/dL (ref 32.0–36.0)
MCV: 89 fL (ref 79.3–98.0)
MONO#: 0.2 10*3/uL (ref 0.1–0.9)
MONO%: 7.1 % (ref 0.0–14.0)
NEUT#: 1.8 10*3/uL (ref 1.5–6.5)
NEUT%: 59.4 % (ref 39.0–75.0)
PLATELETS: 187 10*3/uL (ref 140–400)
RBC: 3.56 10*6/uL — AB (ref 4.20–5.82)
RDW: 16.3 % — ABNORMAL HIGH (ref 11.0–14.6)
WBC: 3.1 10*3/uL — ABNORMAL LOW (ref 4.0–10.3)
lymph#: 0.9 10*3/uL (ref 0.9–3.3)

## 2017-02-03 LAB — COMPREHENSIVE METABOLIC PANEL
ALT: <6 U/L (ref 0–55)
ANION GAP: 7 meq/L (ref 3–11)
AST: 14 U/L (ref 5–34)
Albumin: 3.7 g/dL (ref 3.5–5.0)
Alkaline Phosphatase: 65 U/L (ref 40–150)
BILIRUBIN TOTAL: 0.32 mg/dL (ref 0.20–1.20)
BUN: 29.2 mg/dL — ABNORMAL HIGH (ref 7.0–26.0)
CHLORIDE: 102 meq/L (ref 98–109)
CO2: 31 mEq/L — ABNORMAL HIGH (ref 22–29)
Calcium: 9.9 mg/dL (ref 8.4–10.4)
Creatinine: 1.3 mg/dL (ref 0.7–1.3)
EGFR: 51 mL/min/{1.73_m2} — AB (ref >90)
Glucose: 114 mg/dl (ref 70–140)
POTASSIUM: 4.6 meq/L (ref 3.5–5.1)
Sodium: 140 mEq/L (ref 136–145)
TOTAL PROTEIN: 9 g/dL — AB (ref 6.4–8.3)

## 2017-02-03 NOTE — Telephone Encounter (Signed)
Appointments scheduled per 2/12 LOS. Patient given AVS report and calendars with future scheduled appointments. °

## 2017-02-04 ENCOUNTER — Other Ambulatory Visit: Payer: Self-pay

## 2017-02-04 LAB — KAPPA/LAMBDA LIGHT CHAINS
IG LAMBDA FREE LIGHT CHAIN: 293.4 mg/L — AB (ref 5.7–26.3)
Ig Kappa Free Light Chain: 20.4 mg/L — ABNORMAL HIGH (ref 3.3–19.4)
KAPPA/LAMBDA FLC RATIO: 0.07 — AB (ref 0.26–1.65)

## 2017-02-04 NOTE — Patient Outreach (Signed)
Telephone outreach to patient to obtain mRS was successfully completed. mRS = 1  Nicole Teneshia Hedeen, B.A.  [facility] Care Management Assistant  

## 2017-02-06 LAB — MULTIPLE MYELOMA PANEL, SERUM
ALBUMIN SERPL ELPH-MCNC: 3.6 g/dL (ref 2.9–4.4)
Albumin/Glob SerPl: 0.8 (ref 0.7–1.7)
Alpha 1: 0.4 g/dL (ref 0.0–0.4)
Alpha2 Glob SerPl Elph-Mcnc: 1 g/dL (ref 0.4–1.0)
B-Globulin SerPl Elph-Mcnc: 0.9 g/dL (ref 0.7–1.3)
GAMMA GLOB SERPL ELPH-MCNC: 2.4 g/dL — AB (ref 0.4–1.8)
Globulin, Total: 4.7 g/dL — ABNORMAL HIGH (ref 2.2–3.9)
IGA/IMMUNOGLOBULIN A, SERUM: 91 mg/dL (ref 61–437)
IgG, Qn, Serum: 2558 mg/dL — ABNORMAL HIGH (ref 700–1600)
IgM, Qn, Serum: 30 mg/dL (ref 15–143)
M Protein SerPl Elph-Mcnc: 2 g/dL — ABNORMAL HIGH
TOTAL PROTEIN: 8.3 g/dL (ref 6.0–8.5)

## 2017-02-09 NOTE — Progress Notes (Signed)
[name] Kitchen    HEMATOLOGY/ONCOLOGY CLINIC NOTE  Date of Service: .02/03/2017  Patient Care Team: Mayra Neer, MD as PCP - General (Family Medicine)  CHIEF COMPLAINTS/PURPOSE OF CONSULTATION:   F/u for Plasma cell neoplasm/Multiple myeloma  HISTORY OF PRESENTING ILLNESS:   Justin [name] is a wonderful 76 y.o. 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  Patient has a history of hypertension, diabetes, GERD, gout, squamous cell skin cancers, elevated PSA, SVT status post ablation in May 2016, chronic back pain who has a history of recurrent CVA/TIA's. He apparently had a left hemispheric TIA June 2017 and has had recurrent) subcortical infarcts thought to be related to small vessel disease. He was noted to have a small PFO that was of questionable significance. He was initially on aspirin and then continued and aspirin + plavix for secondary stroke prevention. He follows with Dr. Antony Contras for his neurology cares.  An SPEP was done due to elevated total proteins of 8.9 and showed an M spike of 2 g/dL. He was also noted to have an elevated total protein of 8.6 in April 2017. He was referred to [place] for further evaluation of his M spike. Blood test did not reveal any hypercalcemia, no significant renal failure. He has been noted to have developed anemia since June the serum and his hemoglobin was 12.6 and is now down to the mid 10 range. He notes no focal bone pains at this time. Overt acute weight loss. No fevers no chills no night sweats.  INTERVAL HISTORY  Mr. Gueye is here for follow-up of his myeloma. In light of his intial workup results suggesting Smoldering MM/Myeloma  And relatively recent CVA his inguinal hernia surgery has been placed on hold. He notes that the hernia is manageable with the truss in place. No acute new symptoms. No FND. No new bone pains/fatigue/infections or bleeding issues.   MEDICAL HISTORY:  Past  Medical History:  Diagnosis Date  . 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 Diabetes mellitus without complication ([place])   . GERD (gastroesophageal reflux disease)   . Gout   . History of loop recorder   . Hypertension   . Left leg weakness   . Paroxysmal SVT (supraventricular tachycardia) ([place])    a. s/p RFCA on 05/01/15  . Stroke [facility])     SURGICAL HISTORY: Past Surgical History:  Procedure Laterality Date  . BASAL CELL CARCINOMA EXCISION     off of back  . ELECTROPHYSIOLOGIC STUDY N/A 05/01/2015   Procedure: SVT Ablation;  Surgeon: Evans Lance, MD;  Location: [place] [facility];  Service: Cardiovascular;  Laterality: N/A;  . EP IMPLANTABLE DEVICE N/A 06/04/2016   Procedure: Loop Recorder Insertion;  Surgeon: Thompson Grayer, MD;  Location: [place] [facility];  Service: Cardiovascular;  Laterality: N/A;  . SQUAMOUS CELL CARCINOMA EXCISION    . TEE WITHOUT CARDIOVERSION N/A 06/04/2016   Procedure: TRANSESOPHAGEAL ECHOCARDIOGRAM (TEE);  Surgeon: Pixie Casino, MD;  Location: [facility] ENDOSCOPY;  Service: Cardiovascular;  Laterality: N/A;    SOCIAL HISTORY: Social History   Social History  . Marital status: Married    Spouse name: N/A  . Number of children: N/A  . Years of education: N/A   Occupational History  . Not on file.   Social History Main Topics  . Smoking status: Never Smoker  . Smokeless tobacco: Never Used  . Alcohol  use No  . Drug use: No  . Sexual activity: Not on file   Other Topics Concern  . Not on file   Social History Narrative  . No narrative on file    FAMILY HISTORY: Family History  Problem Relation Age of Onset  . Stroke Mother   . Hypertension Mother   . Alzheimer's disease Father   . Heart failure Father   . Down syndrome Daughter     ALLERGIES:  has No Known Allergies.  MEDICATIONS:  Current Outpatient Prescriptions  Medication Sig Dispense Refill  . allopurinol (ZYLOPRIM) 300 MG tablet Take 300 mg by  mouth.    [name] Kitchen aspirin 325 MG tablet Take 1 tablet (325 mg total) by mouth daily. 90 tablet 0  . atorvastatin (LIPITOR) 40 MG tablet Take 1 tablet (40 mg total) by mouth every morning. 90 tablet 3  . clopidogrel (PLAVIX) 75 MG tablet Take 1 tablet (75 mg total) by mouth daily. 90 tablet 3  . fluorouracil (EFUDEX) 5 % cream Apply 1 application topically daily as needed (for skin cancer prevention).   0  . metFORMIN (GLUCOPHAGE) 500 MG tablet Take 2 tablets (1,000 mg total) by mouth 2 (two) times daily with a meal. (Patient taking differently: Take by mouth daily with breakfast. ) 60 tablet 3  . nitroGLYCERIN (NITROSTAT) 0.4 MG SL tablet Place 1 tablet (0.4 mg total) under the tongue every 5 (five) minutes as needed for chest pain. 25 tablet 3  . omeprazole (PRILOSEC) 20 MG capsule Take 20 mg by mouth every morning.     [name] Kitchen telmisartan-hydrochlorothiazide (MICARDIS HCT) 80-12.5 MG tablet TAKE ONE-HALF (1/2) TABLET DAILY (DISCARD REMAINDER AFTER OPENING). (Patient taking differently: Take 1 tablet by mouth daily. TAKE ONETABLET DAILY) 90 tablet 1   No current facility-administered medications for this visit.     REVIEW OF SYSTEMS:    10 Point review of Systems was done is negative except as noted above.  PHYSICAL EXAMINATION: ECOG PERFORMANCE STATUS: 2 - Symptomatic, <50% confined to bed  . Vitals:   02/03/17 1540  BP: (!) 141/93  Pulse: 77  Resp: 18  Temp: 98.3 F (36.8 C)   Filed Weights   02/03/17 1540  Weight: 166 lb 1.6 oz (75.3 kg)   .Body mass index is 24.53 kg/m.  GENERAL:alert, in no acute distress and comfortable SKIN: skin color, texture, turgor are normal, no rashes or significant lesions EYES: normal, conjunctiva are pink and non-injected, sclera clear OROPHARYNX:no exudate, no erythema and lips, buccal mucosa, and tongue normal  NECK: supple, no JVD, thyroid normal size, non-tender, without nodularity LYMPH:  no palpable lymphadenopathy in the cervical, axillary or  inguinal LUNGS: clear to auscultation with normal respiratory effort HEART: regular rate & rhythm,  no murmurs and no lower extremity edema ABDOMEN: abdomen soft, non-tender, normoactive bowel sounds  Musculoskeletal: no cyanosis of digits and no clubbing  PSYCH: alert & oriented x 3 with fluent speech NEURO: no focal motor/sensory deficits  LABORATORY DATA:  I have reviewed the data as listed . CBC Latest Ref Rng & Units 02/03/2017 12/24/2016 12/10/2016  WBC 4.0 - 10.3 10e3/uL 3.1(L) 3.2(L) 3.8(L)  Hemoglobin 13.0 - 17.1 g/dL 10.3(L) 11.2(L) 10.2(L)  Hematocrit 38.4 - 49.9 % 31.7(L) 33.8(L) 31.4(L)  Platelets 140 - 400 10e3/uL 187 221 243   . CBC    Component Value Date/Time   WBC 3.1 (L) 02/03/2017 1517   WBC 3.2 (L) 12/24/2016 0931   RBC 3.56 (L) 02/03/2017 1517   RBC  3.87 (L) 12/24/2016 0931   HGB 10.3 (L) 02/03/2017 1517   HCT 31.7 (L) 02/03/2017 1517   PLT 187 02/03/2017 1517   MCV 89.0 02/03/2017 1517   MCH 28.9 02/03/2017 1517   MCH 28.9 12/24/2016 0931   MCHC 32.5 02/03/2017 1517   MCHC 33.1 12/24/2016 0931   RDW 16.3 (H) 02/03/2017 1517   LYMPHSABS 0.9 02/03/2017 1517   MONOABS 0.2 02/03/2017 1517   EOSABS 0.1 02/03/2017 1517   BASOSABS 0.0 02/03/2017 1517    CMP Latest Ref Rng & Units 02/03/2017 02/03/2017 11/20/2016  Glucose 70 - 140 mg/dl 114 - 111  BUN 7.0 - 26.0 mg/dL 29.2(H) - 23.8  Creatinine 0.7 - 1.3 mg/dL 1.3 - 1.0  Sodium 136 - 145 mEq/L 140 - 141  Potassium 3.5 - 5.1 mEq/L 4.6 - 3.9  Chloride 101 - 111 mmol/L - - -  CO2 22 - 29 mEq/L 31(H) - 26  Calcium 8.4 - 10.4 mg/dL 9.9 - 9.7  Total Protein 6.4 - 8.3 g/dL 9.0(H) 8.3 9.1(H)  Total Bilirubin 0.20 - 1.20 mg/dL 0.32 - 0.56  Alkaline Phos 40 - 150 U/L 65 - 75  AST 5 - 34 U/L 14 - 17  ALT 0-55 U/L U/L <6 - 6   Component     Latest Ref Rng & Units 11/20/2016  LDH     125 - 245 U/L 117 (L)  Beta 2     0.6 - 2.4 mg/L 2.7 (H)          RADIOGRAPHIC STUDIES: I have personally reviewed the  radiological images as listed and agreed with the findings in the report.  DG Bone Survey Met (Accession [id number]) (Order [id number])  Imaging  Date: 11/28/2016 Department: [facility] Released By: Pricilla Riffle Authorizing: Brunetta Genera, MD  Exam Information   Status Exam Begun  Exam Ended   Final [99] 11/28/2016 11:59 AM 11/28/2016 12:36 PM  PACS Images   Show images for DG Bone Survey Met  Study Result   CLINICAL DATA:  Monoclonal gammopathy of unknown significance. History of squamous cell carcinoma excision, basal cell carcinoma excision.  EXAM: METASTATIC BONE SURVEY  COMPARISON:  CT neck, chest, abdomen and pelvis from 10/29/16, MRI of the head from 06/01/2016, CXR 10/27/2016, lumbar spine radiographs 06/24/2016  FINDINGS: Lateral skull: Small occipital lucency which may represent a normal arachnoid granulation posteriorly in the occiput. No definite lytic abnormality.  Cervical spine AP and lateral: Disc space narrowing at C2-3, and from C4 through C7. C4-5, C5-6 and C6-7 uncovertebral joint spurring bilaterally. No lytic abnormality.  Thoracic spine AP and lateral: T8-9 right-sided osteophytes and to a lesser degree T9-10. No lytic abnormality. Slight multilevel lumbar thoracic disc space narrowing likely degenerative.  Lumbar spine AP and lateral: Disc space narrowing at L4-5 and to a greater extent L5-S1. No lytic disease. L3 through S1 facet sclerosis.  AP pelvis: Negative for lytic disease.  Bilateral upper and lower extremities shoulders through wrist and from the hips through ankle: Negative for lytic disease. Joint space narrowing of the femorotibial compartments both knees. Subchondral cyst of the right patella.  CXR: Clear lungs. Cardiac implantable monitoring device projects over the left heart. Aortic atherosclerosis. No lytic disease.  IMPRESSION: No findings suspicious for lytic  disease.   Electronically Signed   By: Ashley Royalty M.D.   On: 11/28/2016 14:58     ASSESSMENT & PLAN:   76 year old male with  1) IgG Lambda Multiple Myeloma -  RISS 1 BM Bx with 20% clonal plasma cell with Lambda light chain restriction. CYtogenetics and Myeloma FISH panel.- trisomy 11 Patient has normocytic anemia without any other clear etiology. (>2g/dl lower that lower limit of normal which is 13) which per criteria would place this in the Multiple myeloma category as opposed to Smoldering Multiple myeloma (Borderline criterion) No overt renal failure or hypercalcemia at this time No focal bone pains though he has significant chronic back pain related to degenerative disc disease. Bone survey shows no overt lytic lesions. Myeloma panel today shows stable M spike 2 g/dL monoclonal protein spike. significantly abnormal kappa/Lambda Ratio with clear predominance of lambda serum free light chains.   PLAN -Discussed lab findings and diagnosis, prognosis and treatment options for Multiple myeloma. -we discussed that his anemia is borderline which makes his borderline between SMM vs MM but that he would meet criteria and given his CVA's and anemia (without alternative etiology) we would treatment him as MM -we discuss starting treatment now vs short followup and he prefer to hold off pulling the trigger on starting treatment at this time. -he will need short f/u with plan to start treatment with any new changes. -RTC with Dr Irene Limbo in 2 months with labs , earlier if any new symptoms.  2) recurrent CVA - thought to be related to small vessel disease as per neurology. Has a small PFO which could be an additional risk factor. -on ASA + Plavix as per neurology (Dr Leonie Man)  3) Patient Active Problem List   Diagnosis Date Noted  . Stroke (cerebrum) ([place]) 10/28/2016  . Gait abnormality   . History of CVA (cerebrovascular accident)   . History of TIA (transient ischemic attack)   . Benign  essential HTN   . Paroxysmal SVT (supraventricular tachycardia) ([place])   . Acute blood loss anemia   . Acute ischemic stroke ([place])   . CVA (cerebral vascular accident) ([place]) 10/26/2016  . History of recent stroke 06/06/2016  . 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 TIA (transient ischemic attack) 06/02/2016  . Numbness 06/01/2016  . Right arm numbness 06/01/2016  . CAD (coronary artery disease), native coronary artery 03/20/2016  . Hypertension   . GERD (gastroesophageal reflux disease)   . Gout   . S/P RF ablation operation for arrhythmia 12/13/2014  . Hyperlipidemia 12/13/2014  . Diabetes mellitus ([place]) 12/07/2014   PLAN -Continue f/u with PCP   All of the patients questions were answered with apparent satisfaction. The patient knows to call the clinic with any problems, questions or concerns.  I spent 25 minutes counseling the patient face to face. The total time spent in the appointment was 30 minutes and more than 50% was on counseling and direct patient cares.    Sullivan Lone MD [place] AAHIVMS [facility] Hematology/Oncology Physician [facility]  (Office):       [phone] (Work cell):  [phone] (Fax):           [phone]

## 2017-02-12 LAB — CUP PACEART REMOTE DEVICE CHECK
Implantable Pulse Generator Implant Date: 20170613
MDC IDC SESS DTM: 20180110010945

## 2017-02-21 ENCOUNTER — Ambulatory Visit (INDEPENDENT_AMBULATORY_CARE_PROVIDER_SITE_OTHER): Payer: Medicare Other | Admitting: Physician Assistant

## 2017-02-21 ENCOUNTER — Encounter: Payer: Self-pay | Admitting: Physician Assistant

## 2017-02-21 VITALS — BP 115/68 | HR 70 | Ht 69.0 in | Wt 167.6 lb

## 2017-02-21 DIAGNOSIS — I2510 Atherosclerotic heart disease of native coronary artery without angina pectoris: Secondary | ICD-10-CM | POA: Diagnosis not present

## 2017-02-21 DIAGNOSIS — Z8673 Personal history of transient ischemic attack (TIA), and cerebral infarction without residual deficits: Secondary | ICD-10-CM

## 2017-02-21 DIAGNOSIS — I10 Essential (primary) hypertension: Secondary | ICD-10-CM

## 2017-02-21 DIAGNOSIS — I639 Cerebral infarction, unspecified: Secondary | ICD-10-CM

## 2017-02-21 DIAGNOSIS — E118 Type 2 diabetes mellitus with unspecified complications: Secondary | ICD-10-CM

## 2017-02-21 DIAGNOSIS — I471 Supraventricular tachycardia: Secondary | ICD-10-CM | POA: Diagnosis not present

## 2017-02-21 MED ORDER — TELMISARTAN-HCTZ 80-12.5 MG PO TABS: 1.0000 | ORAL_TABLET | Freq: Every day | ORAL | 3 refills | Status: DC

## 2017-02-21 MED ORDER — ATORVASTATIN CALCIUM 40 MG PO TABS: 40.0000 mg | ORAL_TABLET | ORAL | 3 refills | Status: DC

## 2017-02-21 MED ORDER — CLOPIDOGREL BISULFATE 75 MG PO TABS: 75.0000 mg | ORAL_TABLET | Freq: Every day | ORAL | 3 refills | Status: DC

## 2017-02-21 NOTE — Patient Instructions (Signed)
Your physician recommends that you continue on your current medications as directed. Please refer to the Current Medication list given to you today.   Your physician recommends that you schedule a follow-up appointment in: 3 months with Dr. Debara Pickett.

## 2017-02-21 NOTE — Progress Notes (Signed)
Cardiology Office Note    Date:  02/21/2017   ID:  [name], DOB 1941/10/16, MRN [id number]  PCP:  Mayra Neer, MD  Cardiologist:  Dr. Debara Pickett  Oncologist: Dr. Irene Limbo  Chief Complaint  Patient presents with  . Follow-up    seen for Dr. Debara Pickett    History of Present Illness:  Justin [name] is a 76 y.o. male with PMH of CVA, hypertension, DM II, paroxysmal SVT s/p radiofrequency ablation on 05/01/2015, and also possible multiple myeloma. He had a negative Myoview with EF 63% on 12/08/2014. During the stress test while exercising, he developed SVT with heart rate up to 220. This responded to vagal maneuver. He was started on low-dose beta blocker. He later saw Dr. Lovena Le and underwent SVT ablation. He had previous CTA of the chest demonstrating mild-to-moderate coronary artery disease in the intermediate coronary calcium score of 202. He were a monitor between 12/29-01/27/2016 which showed no recurrent SVT and the normal sinus rhythm. He has been followed by Dr. Debara Pickett primarily for risk factor modification and treatment of asymptomatic CAD.  He was last seen in the office on 06/06/2016, unfortunately he had suffered a stroke in [place]. This is confirmed with MRI. He had a transesophageal echocardiogram in June 2017 that was negative for thrombus. Echocardiogram did demonstrated a bidirectional small PFO, however there is no obvious correlation between this with his stroke. Lower extremity Doppler was negative. He was started on Plavix for stroke prevention. A loop recorder was placed on 06/04/2016 by Dr. Rayann Heman for cryptogenic stroke. Since then, patient was admitted with another stroke in November. The source of the stroke was not immediately clear. Aspirin was added to his regiment along with previous Plavix dose as well. He was instructed to continue to DAPT medication for 3 months before dropping the Plavix and continues on aspirin alone. Since discharge, he has been seeing by Dr. Leonie Man, it  was felt his recurrent) subcortical infarct likely results from small vessel disease.  Note, although loop recorder interrogation in September 2017 mentions one episode of atrial fibrillation, however on further review by both Dr. Rayann Heman and me, it appears to be sinus rhythm with PACs instead. Since loop recorder placement, I have not seen any recording of atrial fibrillation on interrogation. He is scheduled to come off Plavix soon and continue on aspirin. He has a right inguinal hernia that he wanted to fix sometimes in the future. From cardiology perspective there is no obvious contraindication, however he will need to discuss with Dr. Leonie Man to decide when he comes off the Plavix. There has been some confusion as on weight which type of antiplatelet medication was he on before, he says he was on aspirin only prior to recent stroke. Plavix was added after the stroke with instruction to discontinue the Plavix after 3 month. I am unclear if there is any benefit of continue DAPT after 3 months, I will defer this decision to neurology service.   Past Medical History:  Diagnosis Date  . 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 Diabetes mellitus without complication ([place])   . GERD (gastroesophageal reflux disease)   . Gout   . History of loop recorder   . Hypertension   . Left leg weakness   . Paroxysmal SVT (supraventricular tachycardia) ([place])    a. s/p RFCA on 05/01/15  . Stroke [id number])     Past Surgical History:  Procedure Laterality Date  . BASAL CELL CARCINOMA  EXCISION     off of back  . ELECTROPHYSIOLOGIC STUDY N/A 05/01/2015   Procedure: SVT Ablation;  Surgeon: Evans Lance, MD;  Location: [facility];  Service: Cardiovascular;  Laterality: N/A;  . EP IMPLANTABLE DEVICE N/A 06/04/2016   Procedure: Loop Recorder Insertion;  Surgeon: Thompson Grayer, MD;  Location: [facility];  Service: Cardiovascular;  Laterality: N/A;  . SQUAMOUS CELL CARCINOMA  EXCISION    . TEE WITHOUT CARDIOVERSION N/A 06/04/2016   Procedure: TRANSESOPHAGEAL ECHOCARDIOGRAM (TEE);  Surgeon: Pixie Casino, MD;  Location: [facility] ENDOS[facility];  Service: Cardiovascular;  Laterality: N/A;    Current Medications: Outpatient Medications Prior to Visit  Medication Sig Dispense Refill  . allopurinol (ZYLOPRIM) 300 MG tablet Take 300 mg by mouth.    [name] Kitchen aspirin 325 MG tablet Take 1 tablet (325 mg total) by mouth daily. 90 tablet 0  . fluorouracil (EFUDEX) 5 % cream Apply 1 application topically daily as needed (for skin cancer prevention).   0  . nitroGLYCERIN (NITROSTAT) 0.4 MG SL tablet Place 1 tablet (0.4 mg total) under the tongue every 5 (five) minutes as needed for chest pain. 25 tablet 3  . omeprazole (PRILOSEC) 20 MG capsule Take 20 mg by mouth every morning.     [name] Kitchen atorvastatin (LIPITOR) 40 MG tablet Take 1 tablet (40 mg total) by mouth every morning. 90 tablet 3  . clopidogrel (PLAVIX) 75 MG tablet Take 1 tablet (75 mg total) by mouth daily. 90 tablet 3  . metFORMIN (GLUCOPHAGE) 500 MG tablet Take 2 tablets (1,000 mg total) by mouth 2 (two) times daily with a meal. (Patient taking differently: Take by mouth daily with breakfast. ) 60 tablet 3  . telmisartan-hydrochlorothiazide (MICARDIS HCT) 80-12.5 MG tablet TAKE ONE-HALF (1/2) TABLET DAILY (DISCARD REMAINDER AFTER OPENING). (Patient taking differently: Take 1 tablet by mouth daily. TAKE ONETABLET DAILY) 90 tablet 1   No facility-administered medications prior to visit.      Allergies:   Patient has no known allergies.   Social History   Social History  . Marital status: Married    Spouse name: N/A  . Number of children: N/A  . Years of education: N/A   Social History Main Topics  . Smoking status: Never Smoker  . Smokeless tobacco: Never Used  . Alcohol use No  . Drug use: No  . Sexual activity: Not Asked   Other Topics Concern  . None   Social History Narrative  . None     Family History:  The  patient's family history includes Alzheimer's disease in his father; Down syndrome in his daughter; Heart failure in his father; Hypertension in his mother; Stroke in his mother.   ROS:   Please see the history of present illness.    ROS All other systems reviewed and are negative.   PHYSICAL EXAM:   VS:  BP 115/68   Pulse 70   Ht 5' 9"  (1.753 m)   Wt 167 lb 9.6 oz (76 kg)   BMI 24.75 kg/m    GEN: Well nourished, well developed, in no acute distress  HEENT: normal  Neck: no JVD, carotid bruits, or masses Cardiac: RRR; no murmurs, rubs, or gallops,no edema  Respiratory:  clear to auscultation bilaterally, normal work of breathing GI: soft, nontender, nondistended, + BS MS: no deformity or atrophy  Skin: warm and dry, no rash Neuro:  Alert and Oriented x 3, Strength and sensation are intact Psych: euthymic mood, full affect  Wt  Readings from Last 3 Encounters:  02/21/17 167 lb 9.6 oz (76 kg)  02/03/17 166 lb 1.6 oz (75.3 kg)  12/31/16 162 lb 5 oz (73.6 kg)      Studies/Labs Reviewed:   EKG:  EKG is not ordered today.   Recent Labs: 02/03/2017: ALT <6; BUN 29.2; Creatinine 1.3; HGB 10.3; Platelets 187; Potassium 4.6; Sodium 140   Lipid Panel    Component Value Date/Time   CHOL 100 10/26/2016 0423   TRIG 74 10/26/2016 0423   HDL 30 (L) 10/26/2016 0423   CHOLHDL 3.3 10/26/2016 0423   VLDL 15 10/26/2016 0423   LDLCALC 55 10/26/2016 0423    Additional studies/ records that were reviewed today include:   Myoview 12/08/2014 IMPRESSION: 1. No reversible ischemia or infarction.  2. Normal left ventricular wall motion.  3. Left ventricular ejection fraction 63 %  4. Low-risk stress test findings   TEE 06/04/2016 LV EF: 55% -   60%  ------------------------------------------------------------------- Indications:      CVA 436.  ------------------------------------------------------------------- Study Conclusions  - Left ventricle: There was mild  concentric hypertrophy. Systolic   function was normal. The estimated ejection fraction was in the   range of 55% to 60%. Wall motion was normal; there were no   regional wall motion abnormalities. - Aortic valve: No evidence of vegetation. - Mitral valve: No evidence of vegetation. There was mild   regurgitation. - Left atrium: The atrium was dilated. No evidence of thrombus in   the atrial cavity or appendage. Bilobar appendage. - Pulmonary veins: No anomaly. - Right atrium: No evidence of thrombus in the atrial cavity or   appendage. Spontaneous echo contrast noted. - Atrial septum: Aneurysmal IAS with bidirectional flow by color   doppler and saline microbubble contrast, suggestive of small PFO. - Pulmonic valve: No evidence of vegetation.  Impressions:  - Small PFO with bidirectional flow noted. Could be source of   stroke. Recommend LE venous dopplers. Would also recommend ILR   given the higher likelihood of PAF with his history of PSVT and   ablation.   MRI / MRA 10/26/16 1. Patchy small volume acute ischemic infarcts involving the posterior right centrum semi ovale and parasagittal right parietal cortex. No associated hemorrhage or mass effect.  2. No other acute intracranial process identified. 3. Moderate chronic microvascular ischemic disease. 4. Normal MRA of the circle of Willis and intracranial arteries.    ASSESSMENT:    1. H/O: CVA (cerebrovascular accident)   2. SVT (supraventricular tachycardia) ([place])   3. Essential hypertension   4. Controlled type 2 diabetes mellitus with complication, without long-term current use of insulin ([place])   5. Atherosclerosis of native coronary artery of native heart without angina pectoris      PLAN:  In order of problems listed above:  1. Recurrent CVA of unclear etiology: Since loop recorder placement last year, he has had another episode of CVA in November 2017. He has been having monthly loop recorder interrogation  since placement, so far there has been no evidence of atrial fibrillation. Although interrogation in September 2017 did mention one episode of A. fib, however on further review by Dr. Rayann Heman and me, it appears to be a misinterpretation and he was actually in sinus rhythm with PACs at the time. Based on the recent note by Dr. Leonie Man, it seems he think the stroke was caused by small vessel disease. Per patient, he was on aspirin only prior to November's episode of stroke. Since the stroke,  he was placed on both aspirin and Plavix with instruction of discontinuing Plavix after 3 months. I am unclear if there is any benefit of continuing DAPT after 3 months in this case, and will defer that decision to our neurology service.  2. SVT s/p ablation: No obvious recurrence. Ablation done by Dr. Lovena Le.  3. Coronary calcification seen on previous CT: He has not had any chest discomfort. No need for ischemic workup at this time.  4. HTN: Pressure 115/68 very well controlled today. He is on Micardis HCT  5. DM II: on metformin at home    Medication Adjustments/Labs and Tests Ordered: Current medicines are reviewed at length with the patient today.  Concerns regarding medicines are outlined above.  Medication changes, Labs and Tests ordered today are listed in the Patient Instructions below. Patient Instructions  Your physician recommends that you continue on your current medications as directed. Please refer to the Current Medication list given to you today.   Your physician recommends that you schedule a follow-up appointment in: 3 months with Dr. Debara Pickett.     Hilbert Corrigan, [place]  02/21/2017 10:35 PM    [facility] [place], [place], [place]  [address] Phone: [phone]; Fax: [phone]

## 2017-02-23 LAB — CUP PACEART REMOTE DEVICE CHECK
Implantable Pulse Generator Implant Date: 20170613
MDC IDC SESS DTM: 20180209010817

## 2017-02-23 NOTE — Progress Notes (Signed)
Carelink summary report received. Battery status OK. Normal device function. No new symptom episodes, tachy episodes, brady, or pause episodes. No new AF episodes. Monthly summary reports and ROV/PRN 

## 2017-03-03 ENCOUNTER — Ambulatory Visit: Payer: Medicare Other | Admitting: Nurse Practitioner

## 2017-03-03 ENCOUNTER — Ambulatory Visit (INDEPENDENT_AMBULATORY_CARE_PROVIDER_SITE_OTHER): Payer: Medicare Other | Admitting: *Deleted

## 2017-03-03 DIAGNOSIS — I639 Cerebral infarction, unspecified: Secondary | ICD-10-CM | POA: Diagnosis not present

## 2017-03-04 NOTE — Progress Notes (Signed)
Carelink Summary Report / Loop Recorder 

## 2017-03-08 LAB — CUP PACEART REMOTE DEVICE CHECK
Implantable Pulse Generator Implant Date: 20170613
MDC IDC SESS DTM: 20180311023704

## 2017-03-08 NOTE — Progress Notes (Signed)
Carelink summary report received. Battery status OK. Normal device function. No new symptom episodes, tachy episodes, brady, or pause episodes. No new AF episodes. Monthly summary reports and ROV/PRN 

## 2017-03-31 ENCOUNTER — Ambulatory Visit (INDEPENDENT_AMBULATORY_CARE_PROVIDER_SITE_OTHER): Payer: Medicare Other | Admitting: *Deleted

## 2017-03-31 DIAGNOSIS — I639 Cerebral infarction, unspecified: Secondary | ICD-10-CM

## 2017-04-01 NOTE — Progress Notes (Signed)
Carelink Summary Report / Loop Recorder 

## 2017-04-03 ENCOUNTER — Ambulatory Visit (HOSPITAL_BASED_OUTPATIENT_CLINIC_OR_DEPARTMENT_OTHER): Payer: Medicare Other | Admitting: Hematology

## 2017-04-03 ENCOUNTER — Other Ambulatory Visit (HOSPITAL_BASED_OUTPATIENT_CLINIC_OR_DEPARTMENT_OTHER): Payer: Medicare Other

## 2017-04-03 ENCOUNTER — Encounter: Payer: Self-pay | Admitting: Hematology

## 2017-04-03 ENCOUNTER — Telehealth: Payer: Self-pay | Admitting: Hematology

## 2017-04-03 VITALS — BP 125/66 | HR 64 | Resp 18 | Ht 69.0 in | Wt 161.8 lb

## 2017-04-03 DIAGNOSIS — E538 Deficiency of other specified B group vitamins: Secondary | ICD-10-CM

## 2017-04-03 DIAGNOSIS — C9000 Multiple myeloma not having achieved remission: Secondary | ICD-10-CM | POA: Diagnosis not present

## 2017-04-03 DIAGNOSIS — Z8673 Personal history of transient ischemic attack (TIA), and cerebral infarction without residual deficits: Secondary | ICD-10-CM

## 2017-04-03 DIAGNOSIS — D649 Anemia, unspecified: Secondary | ICD-10-CM | POA: Diagnosis not present

## 2017-04-03 DIAGNOSIS — D892 Hypergammaglobulinemia, unspecified: Secondary | ICD-10-CM

## 2017-04-03 DIAGNOSIS — D689 Coagulation defect, unspecified: Secondary | ICD-10-CM

## 2017-04-03 DIAGNOSIS — D472 Monoclonal gammopathy: Secondary | ICD-10-CM

## 2017-04-03 LAB — COMPREHENSIVE METABOLIC PANEL
ALBUMIN: 3.7 g/dL (ref 3.5–5.0)
ALK PHOS: 61 U/L (ref 40–150)
ALT: 8 U/L (ref 0–55)
AST: 18 U/L (ref 5–34)
Anion Gap: 10 mEq/L (ref 3–11)
BILIRUBIN TOTAL: 0.39 mg/dL (ref 0.20–1.20)
BUN: 28.2 mg/dL — ABNORMAL HIGH (ref 7.0–26.0)
CO2: 25 mEq/L (ref 22–29)
Calcium: 9.6 mg/dL (ref 8.4–10.4)
Chloride: 101 mEq/L (ref 98–109)
Creatinine: 1.1 mg/dL (ref 0.7–1.3)
EGFR: 62 mL/min/{1.73_m2} — AB (ref >90)
GLUCOSE: 114 mg/dL (ref 70–140)
Potassium: 4 mEq/L (ref 3.5–5.1)
SODIUM: 136 meq/L (ref 136–145)
TOTAL PROTEIN: 9.2 g/dL — AB (ref 6.4–8.3)

## 2017-04-03 LAB — CBC & DIFF AND RETIC
BASO%: 0.3 % (ref 0.0–2.0)
BASOS ABS: 0 10*3/uL (ref 0.0–0.1)
EOS ABS: 0.1 10*3/uL (ref 0.0–0.5)
EOS%: 3.2 % (ref 0.0–7.0)
HEMATOCRIT: 33 % — AB (ref 38.4–49.9)
HEMOGLOBIN: 10.6 g/dL — AB (ref 13.0–17.1)
IMMATURE RETIC FRACT: 6.7 % (ref 3.00–10.60)
LYMPH%: 32.3 % (ref 14.0–49.0)
MCH: 28.7 pg (ref 27.2–33.4)
MCHC: 32.1 g/dL (ref 32.0–36.0)
MCV: 89.4 fL (ref 79.3–98.0)
MONO#: 0.2 10*3/uL (ref 0.1–0.9)
MONO%: 6.5 % (ref 0.0–14.0)
NEUT#: 2.2 10*3/uL (ref 1.5–6.5)
NEUT%: 57.7 % (ref 39.0–75.0)
PLATELETS: 274 10*3/uL (ref 140–400)
RBC: 3.69 10*6/uL — ABNORMAL LOW (ref 4.20–5.82)
RDW: 16.5 % — ABNORMAL HIGH (ref 11.0–14.6)
Retic %: 0.54 % — ABNORMAL LOW (ref 0.80–1.80)
Retic Ct Abs: 19.93 10*3/uL — ABNORMAL LOW (ref 34.80–93.90)
WBC: 3.7 10*3/uL — ABNORMAL LOW (ref 4.0–10.3)
lymph#: 1.2 10*3/uL (ref 0.9–3.3)

## 2017-04-03 LAB — LACTATE DEHYDROGENASE: LDH: 109 U/L — ABNORMAL LOW (ref 125–245)

## 2017-04-03 NOTE — Telephone Encounter (Signed)
Gave patient AVS  and  Calender -scheduled appts per 4/12 los

## 2017-04-04 LAB — KAPPA/LAMBDA LIGHT CHAINS
Ig Kappa Free Light Chain: 24.4 mg/L — ABNORMAL HIGH (ref 3.3–19.4)
Ig Lambda Free Light Chain: 298.3 mg/L — ABNORMAL HIGH (ref 5.7–26.3)
Kappa/Lambda FluidC Ratio: 0.08 — ABNORMAL LOW (ref 0.26–1.65)

## 2017-04-04 NOTE — Progress Notes (Signed)
[name] Kitchen    HEMATOLOGY/ONCOLOGY CLINIC NOTE  Date of Service: .04/03/2017  Patient Care Team: Mayra Neer, MD as PCP - General (Family Medicine)  CHIEF COMPLAINTS/PURPOSE OF CONSULTATION:   F/u for Plasma cell neoplasm/Multiple myeloma  HISTORY OF PRESENTING ILLNESS:   Justin [name] is a wonderful 76 y.o. 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  Patient has a history of hypertension, diabetes, GERD, gout, squamous cell skin cancers, elevated PSA, SVT status post ablation in May 2016, chronic back pain who has a history of recurrent CVA/TIA's. He apparently had a left hemispheric TIA June 2017 and has had recurrent) subcortical infarcts thought to be related to small vessel disease. He was noted to have a small PFO that was of questionable significance. He was initially on aspirin and then continued and aspirin + plavix for secondary stroke prevention. He follows with Dr. Antony Contras for his neurology cares.  An SPEP was done due to elevated total proteins of 8.9 and showed an M spike of 2 g/dL. He was also noted to have an elevated total protein of 8.6 in April 2017. He was referred to [place] for further evaluation of his M spike. Blood test did not reveal any hypercalcemia, no significant renal failure. He has been noted to have developed anemia since June the serum and his hemoglobin was 12.6 and is now down to the mid 10 range. He notes no focal bone pains at this time. Overt acute weight loss. No fevers no chills no night sweats.  INTERVAL HISTORY  [name] is here for follow-up of his myeloma. Patient notes no acute new issues. No new bone pains. No new fatigue. Still continues been aspirin and Plavix for secondary CVA prophylaxis. Notes no new focal neurological deficits. Hemoglobin and creatinine stable. Total protein minimally elevated compared to previous. No overt evidence of clinical disease progression of his myeloma at  this time. He still continues to be anemic with hemoglobin in the mid 10 range. We discussed options of proceeding with treatment versus watchful waiting and he chooses the latter. His myeloma panel and serum free light chains from today are still pending. He is using an truss for his hernia and notes though it is somewhat uncomfortable his hernia has been manageable. He is wondering if he would want to proceed with hernia surgery at this time and will have a discussion with his primary care physician. He was recommended that if he does intend to proceed with surgery that he should let us know since we shall need to get additional coagulopathy labs to ensure no bleeding diathesis due to paraproteinemia.  MEDICAL HISTORY:  Past Medical History:  Diagnosis Date  . 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 Diabetes mellitus without complication ([place])   . GERD (gastroesophageal reflux disease)   . Gout   . History of loop recorder   . Hypertension   . Left leg weakness   . Paroxysmal SVT (supraventricular tachycardia) ([place])    a. s/p RFCA on 05/01/15  . Stroke [facility])     SURGICAL HISTORY: Past Surgical History:  Procedure Laterality Date  . BASAL CELL CARCINOMA EXCISION     off of back  . ELECTROPHYSIOLOGIC STUDY N/A 05/01/2015   Procedure: SVT Ablation;  Surgeon: Evans Lance, MD;  Location: [place] CV LAB;  Service: Cardiovascular;  Laterality: N/A;  . EP IMPLANTABLE DEVICE N/A 06/04/2016  Procedure: Loop Recorder Insertion;  Surgeon: Thompson Grayer, MD;  Location: [place] [facility];  Service: Cardiovascular;  Laterality: N/A;  . SQUAMOUS CELL CARCINOMA EXCISION    . TEE WITHOUT CARDIOVERSION N/A 06/04/2016   Procedure: TRANSESOPHAGEAL ECHOCARDIOGRAM (TEE);  Surgeon: Pixie Casino, MD;  Location: [facility] ENDOSCOPY;  Service: Cardiovascular;  Laterality: N/A;    SOCIAL HISTORY: Social History   Social History  . Marital status: Married    Spouse name:  N/A  . Number of children: N/A  . Years of education: N/A   Occupational History  . Not on file.   Social History Main Topics  . Smoking status: Never Smoker  . Smokeless tobacco: Never Used  . Alcohol use No  . Drug use: No  . Sexual activity: Not on file   Other Topics Concern  . Not on file   Social History Narrative  . No narrative on file    FAMILY HISTORY: Family History  Problem Relation Age of Onset  . Stroke Mother   . Hypertension Mother   . Alzheimer's disease Father   . Heart failure Father   . Down syndrome Daughter     ALLERGIES:  has No Known Allergies.  MEDICATIONS:  Current Outpatient Prescriptions  Medication Sig Dispense Refill  . allopurinol (ZYLOPRIM) 300 MG tablet Take 300 mg by mouth.    [name] Kitchen aspirin 325 MG tablet Take 1 tablet (325 mg total) by mouth daily. 90 tablet 0  . atorvastatin (LIPITOR) 40 MG tablet Take 1 tablet (40 mg total) by mouth every morning. 90 tablet 3  . clopidogrel (PLAVIX) 75 MG tablet Take 1 tablet (75 mg total) by mouth daily. 90 tablet 3  . fluorouracil (EFUDEX) 5 % cream Apply 1 application topically daily as needed (for skin cancer prevention).   0  . metFORMIN (GLUCOPHAGE) 1000 MG tablet Take 1,000 mg by mouth daily with breakfast.    . nitroGLYCERIN (NITROSTAT) 0.4 MG SL tablet Place 1 tablet (0.4 mg total) under the tongue every 5 (five) minutes as needed for chest pain. 25 tablet 3  . omeprazole (PRILOSEC) 20 MG capsule Take 20 mg by mouth every morning.     [name] Kitchen telmisartan-hydrochlorothiazide (MICARDIS HCT) 80-12.5 MG tablet Take 1 tablet by mouth daily. 90 tablet 3   No current facility-administered medications for this visit.     REVIEW OF SYSTEMS:    10 Point review of Systems was done is negative except as noted above.  PHYSICAL EXAMINATION: ECOG PERFORMANCE STATUS: 2 - Symptomatic, <50% confined to bed  . Vitals:   04/03/17 1030  BP: 125/66  Pulse: 64  Resp: 18   Filed Weights   04/03/17 1030    Weight: 161 lb 12.8 oz (73.4 kg)   .Body mass index is 23.89 kg/m.  GENERAL:alert, in no acute distress and comfortable SKIN: skin color, texture, turgor are normal, no rashes or significant lesions EYES: normal, conjunctiva are pink and non-injected, sclera clear OROPHARYNX:no exudate, no erythema and lips, buccal mucosa, and tongue normal  NECK: supple, no JVD, thyroid normal size, non-tender, without nodularity LYMPH:  no palpable lymphadenopathy in the cervical, axillary or inguinal LUNGS: clear to auscultation with normal respiratory effort HEART: regular rate & rhythm,  no murmurs and no lower extremity edema ABDOMEN: abdomen soft, non-tender, normoactive bowel sounds  Musculoskeletal: no cyanosis of digits and no clubbing  PSYCH: alert & oriented x 3 with fluent speech NEURO: no focal motor/sensory deficits  LABORATORY DATA:  I have reviewed the  data as listed . CBC Latest Ref Rng & Units 04/03/2017 02/03/2017 12/24/2016  WBC 4.0 - 10.3 10e3/uL 3.7(L) 3.1(L) 3.2(L)  Hemoglobin 13.0 - 17.1 g/dL 10.6(L) 10.3(L) 11.2(L)  Hematocrit 38.4 - 49.9 % 33.0(L) 31.7(L) 33.8(L)  Platelets 140 - 400 10e3/uL 274 187 221   . CBC    Component Value Date/Time   WBC 3.7 (L) 04/03/2017 0924   WBC 3.2 (L) 12/24/2016 0931   RBC 3.69 (L) 04/03/2017 0924   RBC 3.87 (L) 12/24/2016 0931   HGB 10.6 (L) 04/03/2017 0924   HCT 33.0 (L) 04/03/2017 0924   PLT 274 04/03/2017 0924   MCV 89.4 04/03/2017 0924   MCH 28.7 04/03/2017 0924   MCH 28.9 12/24/2016 0931   MCHC 32.1 04/03/2017 0924   MCHC 33.1 12/24/2016 0931   RDW 16.5 (H) 04/03/2017 0924   LYMPHSABS 1.2 04/03/2017 0924   MONOABS 0.2 04/03/2017 0924   EOSABS 0.1 04/03/2017 0924   BASOSABS 0.0 04/03/2017 0924    CMP Latest Ref Rng & Units 04/03/2017 02/03/2017 02/03/2017  Glucose 70 - 140 mg/dl 114 114 -  BUN 7.0 - 26.0 mg/dL 28.2(H) 29.2(H) -  Creatinine 0.7 - 1.3 mg/dL 1.1 1.3 -  Sodium 136 - 145 mEq/L 136 140 -  Potassium 3.5 - 5.1  mEq/L 4.0 4.6 -  Chloride 101 - 111 mmol/L - - -  CO2 22 - 29 mEq/L 25 31(H) -  Calcium 8.4 - 10.4 mg/dL 9.6 9.9 -  Total Protein 6.4 - 8.3 g/dL 9.2(H) 9.0(H) 8.3  Total Bilirubin 0.20 - 1.20 mg/dL 0.39 0.32 -  Alkaline Phos 40 - 150 U/L 61 65 -  AST 5 - 34 U/L 18 14 -  ALT 0 - 55 U/L 8 <6 -   Component     Latest Ref Rng & Units 11/20/2016  LDH     125 - 245 U/L 117 (L)  Beta 2     0.6 - 2.4 mg/L 2.7 (H)          RADIOGRAPHIC STUDIES: I have personally reviewed the radiological images as listed and agreed with the findings in the report.  DG Bone Survey Met (Accession [id number]) (Order [id number])  Imaging  Date: 11/28/2016 Department: [facility] Released By: Pricilla Riffle Authorizing: Brunetta Genera, MD  Exam Information   Status Exam Begun  Exam Ended   Final [[id number]] 11/28/2016 11:59 AM 11/28/2016 12:36 PM  PACS Images   Show images for DG Bone Survey Met  Study Result   CLINICAL DATA:  Monoclonal gammopathy of unknown significance. History of squamous cell carcinoma excision, basal cell carcinoma excision.  EXAM: METASTATIC BONE SURVEY  COMPARISON:  CT neck, chest, abdomen and pelvis from 10/29/16, MRI of the head from 06/01/2016, CXR 10/27/2016, lumbar spine radiographs 06/24/2016  FINDINGS: Lateral skull: Small occipital lucency which may represent a normal arachnoid granulation posteriorly in the occiput. No definite lytic abnormality.  Cervical spine AP and lateral: Disc space narrowing at C2-3, and from C4 through C7. C4-5, C5-6 and C6-7 uncovertebral joint spurring bilaterally. No lytic abnormality.  Thoracic spine AP and lateral: T8-9 right-sided osteophytes and to a lesser degree T9-10. No lytic abnormality. Slight multilevel lumbar thoracic disc space narrowing likely degenerative.  Lumbar spine AP and lateral: Disc space narrowing at L4-5 and to a greater extent L5-S1. No lytic disease. L3  through S1 facet sclerosis.  AP pelvis: Negative for lytic disease.  Bilateral upper and lower extremities shoulders through wrist and from  the hips through ankle: Negative for lytic disease. Joint space narrowing of the femorotibial compartments both knees. Subchondral cyst of the right patella.  CXR: Clear lungs. Cardiac implantable monitoring device projects over the left heart. Aortic atherosclerosis. No lytic disease.  IMPRESSION: No findings suspicious for lytic disease.   Electronically Signed   By: Ashley Royalty M.D.   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 CYtogenetics and Myeloma FISH panel.- trisomy 11 Patient has normocytic anemia without any other clear etiology. (>2g/dl lower that lower limit of normal which is 13) which per criteria would place this in the Multiple myeloma category as opposed to Smoldering Multiple myeloma (Borderline criterion) No overt renal failure or hypercalcemia at this time No focal bone pains though he has significant chronic back pain related to degenerative disc disease. Bone survey shows no overt lytic lesions. Myeloma panel Previously M spike 2 g/dL monoclonal protein spike. significantly abnormal kappa/Lambda Ratio with clear predominance of lambda serum free light chains. Myeloma panel and serum free light chains pending from today.  PLAN -Discussed available lab results from today -we again discussed that his anemia is borderline which makes his borderline between SMM vs MM but that he would meet criteria and given his CVA's and anemia (without alternative etiology) we would treat him as MM -we rediscuss starting treatment now vs short followup and he prefer to hold off pulling the trigger on starting treatment at this time. -We'll follow up on pending myeloma panel and serum free light  chains. If these are significantly changed it might do the balance afterwards more strongly recommending starting myeloma treatment immediately. -he will need short f/u with plan to start treatment with any new changes in 2 months, earlier if any new clinical concerns. -Patient order thrombin time PT PTT with next labs to rule out any bleeding diathesis related to his paraproteinemia since he is contemplating possible hernia surgery. These labs will need to be done sooner if there is any specific surgical plan prior to his next visit. -RTC with Dr Irene Limbo in 2 months with labs , earlier if any new symptoms.  2) recurrent CVA - thought to be related to small vessel disease as per neurology. Has a small PFO which could be an additional risk factor. -on ASA + Plavix as per neurology (Dr Leonie Man)  3) Patient Active Problem List   Diagnosis Date Noted  . Stroke (cerebrum) ([place]) 10/28/2016  . Gait abnormality   . History of CVA (cerebrovascular accident)   . History of TIA (transient ischemic attack)   . Benign essential HTN   . Paroxysmal SVT (supraventricular tachycardia) ([place])   . Acute blood loss anemia   . Acute ischemic stroke ([place])   . CVA (cerebral vascular accident) ([place]) 10/26/2016  . History of recent stroke 06/06/2016  . 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 TIA (transient ischemic attack) 06/02/2016  . Numbness 06/01/2016  . Right arm numbness 06/01/2016  . CAD (coronary artery disease), native coronary artery 03/20/2016  . Hypertension   . GERD (gastroesophageal reflux disease)   . Gout   . S/P RF ablation operation for arrhythmia 12/13/2014  . Hyperlipidemia 12/13/2014  . Diabetes mellitus ([place]) 12/07/2014   PLAN -Continue f/u with PCP   All of the patients questions were answered with apparent satisfaction. The patient knows  to call the clinic with any problems, questions or concerns.  I spent 20 minutes counseling the patient face to face. The total time spent in  the appointment was 25 minutes and more than 50% was on counseling and direct patient cares.    Sullivan Lone MD [place] AAHIVMS [facility] Hematology/Oncology Physician [facility]  (Office):       [phone] (Work cell):  [phone] (Fax):           [phone]

## 2017-04-07 LAB — MULTIPLE MYELOMA PANEL, SERUM
ALBUMIN SERPL ELPH-MCNC: 3.7 g/dL (ref 2.9–4.4)
ALPHA 1: 0.4 g/dL (ref 0.0–0.4)
Albumin/Glob SerPl: 0.8 (ref 0.7–1.7)
Alpha2 Glob SerPl Elph-Mcnc: 1.1 g/dL — ABNORMAL HIGH (ref 0.4–1.0)
B-Globulin SerPl Elph-Mcnc: 1 g/dL (ref 0.7–1.3)
Gamma Glob SerPl Elph-Mcnc: 2.5 g/dL — ABNORMAL HIGH (ref 0.4–1.8)
Globulin, Total: 4.9 g/dL — ABNORMAL HIGH (ref 2.2–3.9)
IgA, Qn, Serum: 80 mg/dL (ref 61–437)
IgG, Qn, Serum: 2730 mg/dL — ABNORMAL HIGH (ref 700–1600)
IgM, Qn, Serum: 34 mg/dL (ref 15–143)
M Protein SerPl Elph-Mcnc: 2.2 g/dL — ABNORMAL HIGH
TOTAL PROTEIN: 8.6 g/dL — AB (ref 6.0–8.5)

## 2017-04-10 ENCOUNTER — Telehealth: Payer: Self-pay | Admitting: *Deleted

## 2017-04-10 NOTE — Telephone Encounter (Signed)
Informed patient of information below. Patient verbalized understanding.

## 2017-04-11 LAB — CUP PACEART REMOTE DEVICE CHECK
Date Time Interrogation Session: 20180410033855
Implantable Pulse Generator Implant Date: 20170613

## 2017-04-30 ENCOUNTER — Ambulatory Visit (INDEPENDENT_AMBULATORY_CARE_PROVIDER_SITE_OTHER): Payer: Medicare Other | Admitting: *Deleted

## 2017-04-30 DIAGNOSIS — I639 Cerebral infarction, unspecified: Secondary | ICD-10-CM | POA: Diagnosis not present

## 2017-05-01 ENCOUNTER — Telehealth: Payer: Self-pay | Admitting: Neurology

## 2017-05-01 NOTE — Progress Notes (Signed)
Carelink Summary Report / Loop Recorder 

## 2017-05-01 NOTE — Telephone Encounter (Signed)
LEft vm for patient that refill for plavix was done by his cardiologist Almyra Deforest PA. Rn request on vm he call the cardiology office. From notes pt was given plavix refill in March for a year.

## 2017-05-01 NOTE — Telephone Encounter (Signed)
Pt called in stating that it was Dr Leonie Man that handled his plavix prescription, I spoke with RN Katrina who confirmed that it is his Cardiologist office that handles that, pt okayed and said he will reach out to them.

## 2017-05-09 LAB — CUP PACEART REMOTE DEVICE CHECK
Implantable Pulse Generator Implant Date: 20170613
MDC IDC SESS DTM: 20180510170936

## 2017-05-12 ENCOUNTER — Ambulatory Visit: Payer: Medicare Other | Admitting: Neurology

## 2017-05-27 ENCOUNTER — Ambulatory Visit (INDEPENDENT_AMBULATORY_CARE_PROVIDER_SITE_OTHER): Payer: Medicare Other | Admitting: Neurology

## 2017-05-27 ENCOUNTER — Encounter (INDEPENDENT_AMBULATORY_CARE_PROVIDER_SITE_OTHER): Payer: Self-pay

## 2017-05-27 ENCOUNTER — Encounter: Payer: Self-pay | Admitting: Neurology

## 2017-05-27 VITALS — BP 95/53 | HR 64 | Wt 163.0 lb

## 2017-05-27 DIAGNOSIS — Z8673 Personal history of transient ischemic attack (TIA), and cerebral infarction without residual deficits: Secondary | ICD-10-CM | POA: Diagnosis not present

## 2017-05-27 DIAGNOSIS — I639 Cerebral infarction, unspecified: Secondary | ICD-10-CM | POA: Diagnosis not present

## 2017-05-27 NOTE — Patient Instructions (Signed)
I had a long d/w patient about his remote TIA and cryptogenic stroke, risk for recurrent stroke/TIAs, personally independently reviewed imaging studies and stroke evaluation results and answered questions.Continue aspirin 325 mg daily  for secondary stroke prevention and maintain strict control of hypertension with blood pressure goal below 130/90, diabetes with hemoglobin A1c goal below 6.5% and lipids with LDL cholesterol goal below 70 mg/dL. I also advised the patient to eat a healthy diet with plenty of whole grains, cereals, fruits and vegetables, exercise regularly and maintain ideal body weight. The patient was neurologically cleared to undergo hernia surgery if needed and he may need to hold aspirin for a few days prior to the surgery and to resume it after surgery when safe with small but acceptable peri-procedural risk of TIA/stroke. Followup in the future with me in 1 year or call earlier if necessary

## 2017-05-27 NOTE — Progress Notes (Signed)
[facility] [address] [place]. [place] [address] [phone]       OFFICE FOLLOW-UP NOTE  [name] Date of Birth:  [birth year] Medical Record Number:  [id number]   HPI:   Office visit 09/03/2016 : 76 year old male seen today for first office follow-up visit for hospital admission for TIA in June 2017 Justin [name] is a 76 y.o. male with a history of SVT who presented to Hospital in [facility] 2 weeks ago with left arm and facial numbness. MRI revealed 2 tiny areas of infarct in the same distribution. I'm not sure what workup was done at that time, but he did have a CTA head and neck as well as MRI. Oncology 06/01/16, he had sudden onset right arm and face numbness. It resolved over the course of approximately 30 minutes. He states that this was similar to the episode that he had prior, except that is on the opposite side. It is completely resolved at this point. He denies any difficulty with speech, or weakness. LKW: 1630 on 06/01/16 tpa given?: no, resolved symptoms . MRI scan the brain showed no acute process but showed old bilateral basal ganglia lacunar infarcts and moderate changes of chronic small vessel disease. 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 MRA of the brain showed no large vessel occlusion or stenosis. Carotid Doppler showed no significant extracranial stenosis. Transthoracic echo and transesophageal echo both did not show any cardiac source of embolism. TEE showed a small PFO with bidirectional shunt fissures not felt to be of clinical significance LDL cholesterol 52 mg percent. Hemoglobin A1c was elevated at 7.5. Patient was previously on aspirin which was switched to Plavix for stroke prevention. He states his done well. He states that  2 weeks prior to his admission to [facility] for TIA he was admitted in [facility] with the right MCA branch  embolic strokes following which was started on aspirin. He is now tolerating Plavix well without bleeding or bruising. He is also on Lipitor which is tolerating well without muscle aches and pains. He states his blood pressure is well controlled and today it is 110/70. He states his fasting sugars have been better now and he plans to repeat hemoglobin A1c next week. He has no new complaints today. He had a loop recorder inserted on 6/13 117 and so for paroxysmal atrial fibrillation has not yet been to detected. Update 11/11/2016 ;  patient is worked in urgently to is seen following recent hospitalization for recurrent stroke. He was readmitted on 11/ 3/17 with   new onset loss of control of his left lower extremity with weakness and numbness. Onset was at 8:30 PM tonight. He's been taking Plavix 75 mg per day. CT scan of his head showed old bilateral basal ganglia infarctions, but no acute findings. NIH stroke score was 3. Patient was deemed a candidate for TPA because of the significant disability, with inability to bear weight weight and control movement of his left lower extremity acutely. LSN:8:30 PM on 10/25/2016 tPA Given:Yes. Patient was admitted to the neuro ICU and closely monitored with strict blood pressure control. Symptoms resolved quickly after TPA. MRI scan showed punctate white matter infarcts in the right parietal and frontal white matter. Carotid Doppler showed no significant expectoration stenosis. Bilateral lower extremity venous Dopplers were negative for DVT. Transthoracic echo showed normal ejection fraction. Patient had a CT scan of the chest abdomen and  pelvis to look for malignancy which wasn't negative. MRI of the lumbar spine showed no significant degenerative changes. CT) of the neck showed no significant large vessel stenosis or occlusion. Patient had a loop recorder implanted in the prior admission which was interrogated and did not show paroxysmal A. fib. TEE was not repeated as  done in the previous admission had shown a PFO which was small. LDL cholesterol was 55 mg percent and hemoglobin A1c was 6.7. Patient was on Plavix prior to admission and aspirin was added with plan for dual antiplatelet therapy for 3 months followed by Plavix alone. Patient did not show interest in either endovascular PFO closure on participation in the [place] trial. He states his done well since discharge his leg weakness and walking are much better. Is getting home physical and occupational therapy. Is tolerating aspirin and Plavix without significant bruising or bleeding. His blood pressure is well controlled and today it is 120/time foot. He is tolerating his statin well without muscle aches or pains. His fasting sugars are pretty good and below 100. In fact he  has no complaints Update 05/27/2017 : He returns for follow-up after last visit 6 months ago. He continues to do well without recurrent stroke or TIA symptoms. He has discontinued Plavix a few weeks ago and is currently on aspirin alone. Patient has had no recurrent neurological symptoms. He however has been diagnosed with right inguinal hernia which is bothering him. He plans to see a surgeon soon and schedule surgery. 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 He is currently seeing hematologist Dr. Irene Limbo for multiple myeloma treatment  ROS:   14 system review of systems is positive for  frequent waking and chills only and all other systems negative  PMH:  Past Medical History:  Diagnosis Date  . 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 Diabetes mellitus without complication ([place])   . GERD (gastroesophageal reflux disease)   . Gout   . History of loop recorder   . Hypertension   . Left leg weakness   . Paroxysmal SVT (supraventricular tachycardia) ([place])    a. s/p RFCA on 05/01/15  . Stroke [facility])      Social History:  Social History   Social History  . Marital status: Married    Spouse name: N/A  . Number of children: N/A  . Years of education: N/A   Occupational History  . Not on file.   Social History Main Topics  . Smoking status: Never Smoker  . Smokeless tobacco: Never Used  . Alcohol use No  . Drug use: No  . Sexual activity: Not on file   Other Topics Concern  . Not on file   Social History Narrative  . No narrative on file    Medications:   Current Outpatient Prescriptions on File Prior to Visit  Medication Sig Dispense Refill  . allopurinol (ZYLOPRIM) 300 MG tablet Take 300 mg by mouth.    [name] Kitchen aspirin 325 MG tablet Take 1 tablet (325 mg total) by mouth daily. 90 tablet 0  . atorvastatin (LIPITOR) 40 MG tablet Take 1 tablet (40 mg total) by mouth every morning. 90 tablet 3  . fluorouracil (EFUDEX) 5 % cream Apply 1 application topically daily as needed (for skin cancer prevention).   0  . metFORMIN (GLUCOPHAGE) 1000 MG tablet  Take 1,000 mg by mouth daily with breakfast.    . nitroGLYCERIN (NITROSTAT) 0.4 MG SL tablet Place 1 tablet (0.4 mg total) under the tongue every 5 (five) minutes as needed for chest pain. 25 tablet 3  . omeprazole (PRILOSEC) 20 MG capsule Take 20 mg by mouth every morning.     [name] Kitchen telmisartan-hydrochlorothiazide (MICARDIS HCT) 80-12.5 MG tablet Take 1 tablet by mouth daily. 90 tablet 3   No current facility-administered medications on file prior to visit.     Allergies:  No Known Allergies  Physical Exam General: well developed, well nourished elderly Caucasian male, seated, in no evident distress Head: head normocephalic and atraumatic.  Neck: supple with no carotid or supraclavicular bruits Cardiovascular: regular rate and rhythm, no murmurs Musculoskeletal: no deformity Skin:  no rash/petichiae Vascular:  Normal pulses all extremities Vitals:   05/27/17 0947  BP: (!) 95/53  Pulse: 64   Neurologic Exam Mental Status: Awake  and fully alert. Oriented to place and time. Recent and remote memory intact. Attention span, concentration and fund of knowledge appropriate. Mood and affect appropriate.  Cranial Nerves: Fundoscopic exam not done Pupils equal, briskly reactive to light. Extraocular movements full without nystagmus. Visual fields full to confrontation. Hearing intact. Facial sensation intact. Face, tongue, palate moves normally and symmetrically.  Motor: Normal bulk and tone. Normal strength in all tested extremity muscles. Sensory.: intact to touch ,pinprick .position and vibratory sensation.  Coordination: Rapid alternating movements normal in all extremities. Finger-to-nose and heel-to-shin performed accurately bilaterally. Gait and Station: Arises from chair without difficulty. Stance is normal. Gait demonstrates normal stride length and balance . Able to heel, toe and tandem walk without difficulty.  Reflexes: 1+ and symmetric. Toes downgoing.      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 Vascular risk factors of diabetes, hypertension, hyperlipidemia.Small PFO unlikely to be of clinical significance. Recurrent right brain subcortical infarct likely from small vessel disease in November 2017 from which he has recovered well. Recent diagnosis of multiple myeloma    PLAN: I had a long d/w patient about his remote TIA and cryptogenic stroke, risk for recurrent stroke/TIAs, personally independently reviewed imaging studies and stroke evaluation results and answered questions.Continue aspirin 325 mg daily  for secondary stroke prevention and maintain strict control of hypertension with blood pressure goal below 130/90, diabetes with hemoglobin A1c goal below 6.5% and lipids with LDL cholesterol goal below 70 mg/dL. I also advised the patient to eat a healthy  diet with plenty of whole grains, cereals, fruits and vegetables, exercise regularly and maintain ideal body weight. The patient was neurologically cleared to undergo hernia surgery if needed and he may need to hold aspirin for a few days prior to the surgery and to resume it after surgery when safe with small but acceptable peri-procedural risk of TIA/stroke. Followup in the future with me in 1 year or call earlier if necessary.Greater than 50% of time during this 25 minute visit was spent on counseling,explanation of diagnosis, planning of further management, discussion with patient and family and coordination of care Antony Contras, MD  [facility] [address] [place], [place] [address]  Phone [phone] Fax [phone] Note: This document was prepared with digital dictation and possible smart phrase technology. Any transcriptional errors that result from this process are unintentional

## 2017-06-02 ENCOUNTER — Ambulatory Visit (INDEPENDENT_AMBULATORY_CARE_PROVIDER_SITE_OTHER): Payer: Medicare Other | Admitting: *Deleted

## 2017-06-02 ENCOUNTER — Other Ambulatory Visit (HOSPITAL_BASED_OUTPATIENT_CLINIC_OR_DEPARTMENT_OTHER): Payer: Medicare Other

## 2017-06-02 ENCOUNTER — Ambulatory Visit (HOSPITAL_BASED_OUTPATIENT_CLINIC_OR_DEPARTMENT_OTHER): Payer: Medicare Other | Admitting: Hematology

## 2017-06-02 ENCOUNTER — Telehealth: Payer: Self-pay | Admitting: Hematology

## 2017-06-02 VITALS — BP 123/74 | HR 73 | Resp 18 | Ht 69.0 in | Wt 160.9 lb

## 2017-06-02 DIAGNOSIS — Z8673 Personal history of transient ischemic attack (TIA), and cerebral infarction without residual deficits: Secondary | ICD-10-CM | POA: Diagnosis not present

## 2017-06-02 DIAGNOSIS — D689 Coagulation defect, unspecified: Secondary | ICD-10-CM

## 2017-06-02 DIAGNOSIS — E538 Deficiency of other specified B group vitamins: Secondary | ICD-10-CM

## 2017-06-02 DIAGNOSIS — D649 Anemia, unspecified: Secondary | ICD-10-CM

## 2017-06-02 DIAGNOSIS — K4090 Unilateral inguinal hernia, without obstruction or gangrene, not specified as recurrent: Secondary | ICD-10-CM | POA: Diagnosis not present

## 2017-06-02 DIAGNOSIS — C9000 Multiple myeloma not having achieved remission: Secondary | ICD-10-CM

## 2017-06-02 DIAGNOSIS — I639 Cerebral infarction, unspecified: Secondary | ICD-10-CM

## 2017-06-02 LAB — IRON AND TIBC
%SAT: 19 % — AB (ref 20–55)
Iron: 45 ug/dL (ref 42–163)
TIBC: 233 ug/dL (ref 202–409)
UIBC: 187 ug/dL (ref 117–376)

## 2017-06-02 LAB — CBC & DIFF AND RETIC
BASO%: 0.2 % (ref 0.0–2.0)
BASOS ABS: 0 10*3/uL (ref 0.0–0.1)
EOS%: 1.1 % (ref 0.0–7.0)
Eosinophils Absolute: 0.1 10*3/uL (ref 0.0–0.5)
HEMATOCRIT: 31.5 % — AB (ref 38.4–49.9)
HGB: 10.1 g/dL — ABNORMAL LOW (ref 13.0–17.1)
Immature Retic Fract: 8.3 % (ref 3.00–10.60)
LYMPH%: 19.6 % (ref 14.0–49.0)
MCH: 28.8 pg (ref 27.2–33.4)
MCHC: 32.1 g/dL (ref 32.0–36.0)
MCV: 89.7 fL (ref 79.3–98.0)
MONO#: 0.2 10*3/uL (ref 0.1–0.9)
MONO%: 3.8 % (ref 0.0–14.0)
NEUT%: 75.3 % — ABNORMAL HIGH (ref 39.0–75.0)
NEUTROS ABS: 4.2 10*3/uL (ref 1.5–6.5)
Platelets: 314 10*3/uL (ref 140–400)
RBC: 3.51 10*6/uL — ABNORMAL LOW (ref 4.20–5.82)
RDW: 17.3 % — AB (ref 11.0–14.6)
RETIC %: 0.72 % — AB (ref 0.80–1.80)
Retic Ct Abs: 25.27 10*3/uL — ABNORMAL LOW (ref 34.80–93.90)
WBC: 5.6 10*3/uL (ref 4.0–10.3)
lymph#: 1.1 10*3/uL (ref 0.9–3.3)

## 2017-06-02 LAB — COMPREHENSIVE METABOLIC PANEL
ALT: <6 U/L (ref 0–55)
AST: 14 U/L (ref 5–34)
Albumin: 3.7 g/dL (ref 3.5–5.0)
Alkaline Phosphatase: 63 U/L (ref 40–150)
Anion Gap: 10 mEq/L (ref 3–11)
BUN: 26.5 mg/dL — AB (ref 7.0–26.0)
CALCIUM: 9.8 mg/dL (ref 8.4–10.4)
CHLORIDE: 101 meq/L (ref 98–109)
CO2: 27 mEq/L (ref 22–29)
Creatinine: 1.2 mg/dL (ref 0.7–1.3)
EGFR: 61 mL/min/{1.73_m2} — AB (ref >90)
Glucose: 111 mg/dl (ref 70–140)
Potassium: 4.2 mEq/L (ref 3.5–5.1)
Sodium: 138 mEq/L (ref 136–145)
Total Bilirubin: 0.49 mg/dL (ref 0.20–1.20)
Total Protein: 9.4 g/dL — ABNORMAL HIGH (ref 6.4–8.3)

## 2017-06-02 LAB — FERRITIN: FERRITIN: 791 ng/mL — AB (ref 22–316)

## 2017-06-02 NOTE — Telephone Encounter (Signed)
Appointments scheduled per 06/02/17 los. Patient was given a copy of the AVS report and appointment schedule, per 06/02/17 los.

## 2017-06-02 NOTE — Progress Notes (Signed)
Carelink Summary Report / Loop Recorder 

## 2017-06-03 LAB — VITAMIN B12: Vitamin B12: 299 pg/mL (ref 232–1245)

## 2017-06-03 LAB — KAPPA/LAMBDA LIGHT CHAINS
IG LAMBDA FREE LIGHT CHAIN: 325 mg/L — AB (ref 5.7–26.3)
Ig Kappa Free Light Chain: 21.4 mg/L — ABNORMAL HIGH (ref 3.3–19.4)
Kappa/Lambda FluidC Ratio: 0.07 — ABNORMAL LOW (ref 0.26–1.65)

## 2017-06-03 LAB — PROTHROMBIN TIME (PT)
INR: 1 (ref 0.8–1.2)
Prothrombin Time: 11 s (ref 9.1–12.0)

## 2017-06-03 LAB — THROMBIN TIME: Thrombin Time: 16.8 s (ref 0.0–23.0)

## 2017-06-03 LAB — APTT: aPTT: 30 s (ref 24–33)

## 2017-06-04 LAB — MULTIPLE MYELOMA PANEL, SERUM
ALBUMIN SERPL ELPH-MCNC: 3.7 g/dL (ref 2.9–4.4)
Albumin/Glob SerPl: 0.8 (ref 0.7–1.7)
Alpha 1: 0.4 g/dL (ref 0.0–0.4)
Alpha2 Glob SerPl Elph-Mcnc: 1.1 g/dL — ABNORMAL HIGH (ref 0.4–1.0)
B-GLOBULIN SERPL ELPH-MCNC: 1 g/dL (ref 0.7–1.3)
Gamma Glob SerPl Elph-Mcnc: 2.6 g/dL — ABNORMAL HIGH (ref 0.4–1.8)
Globulin, Total: 5 g/dL — ABNORMAL HIGH (ref 2.2–3.9)
IGA/IMMUNOGLOBULIN A, SERUM: 75 mg/dL (ref 61–437)
IGM (IMMUNOGLOBIN M), SRM: 35 mg/dL (ref 15–143)
IgG, Qn, Serum: 2877 mg/dL — ABNORMAL HIGH (ref 700–1600)
M PROTEIN SERPL ELPH-MCNC: 2.3 g/dL — AB
TOTAL PROTEIN: 8.7 g/dL — AB (ref 6.0–8.5)

## 2017-06-05 ENCOUNTER — Telehealth: Payer: Self-pay

## 2017-06-05 ENCOUNTER — Telehealth: Payer: Self-pay | Admitting: Neurology

## 2017-06-05 ENCOUNTER — Ambulatory Visit: Payer: Self-pay | Admitting: Surgery

## 2017-06-05 DIAGNOSIS — K4020 Bilateral inguinal hernia, without obstruction or gangrene, not specified as recurrent: Secondary | ICD-10-CM | POA: Diagnosis not present

## 2017-06-05 NOTE — Telephone Encounter (Signed)
Telephone encounter.

## 2017-06-05 NOTE — H&P (Signed)
  Justin [name] Justin [name] 06/05/2017 12:24 PM Location: Justin [name] Patient #: [id number] DOB: 1941-03-22 Married / Language: Justin [name] / Race: White Male  History of Present Illness (Justin [name] A. Kae Heller MD; 06/05/2017 12:58 PM) Patient words: He is now off plavix but still on full dose asa. neurologist has OK'd holding aspirin for [name]. Did not end up getting any treatment yet for Multiple myeloma. Saw Dr. Irene [name] in April, whose notes state "He was recommended that if he does intend to proceed with [name] that he should let us know since we shall need to get additional coagulopathy labs to ensure no bleeding diathesis due to paraproteinemia." Looks like he was seen earlier this week as well but notes not in yet.  From December 2017: This very nice 76 year old man who presents with a right inguinal hernia. He noticed it about 3 weeks ago, and has caused him significant pain which fortunately was relieved with the initiation of using a hernia belt. He denies any symptoms of nausea, vomiting, abdominal distention or change in bowel habits. Of note he had an ischemic stroke at the beginning of November, which left him with some residual left lower extremities weakness and he now uses a cane. He wonders if the physical therapy that he did for his stroke may have incurred the hernia to become symptomatic. He has never had any abdominal [name]. He takes full dose aspirin and Plavix for his stroke. 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  The patient is a 76 year old male.   Medication History Justin [name], CMA; 06/05/2017 12:27 PM) Fluorouracil (5% Cream, External) Active. Micardis HCT (80-12.5MG Tablet, Oral) Active. Omeprazole (20MG Capsule DR, Oral)  Active. Allopurinol (300MG Tablet, Oral) Active. Atorvastatin Calcium (40MG Tablet, Oral) Active. Aspirin (81MG Tablet, Oral) Active. MetFORMIN HCl (1000MG Tablet, Oral) Active. Nitrostat (0.4MG Tab Sublingual, Sublingual) Active. Medications Reconciled    Vitals (Justin [name] CMA; 06/05/2017 12:25 PM) 06/05/2017 12:24 PM Weight: 161.5 lb Height: 69in Body Surface Area: 1.89 m Body Mass Index: 23.85 kg/m  Temp.: 97.55F  Pulse: 84 (Regular)  P.OX: 92% (Room air) BP: 122/76 (Sitting, Left Arm, Standard)      Assessment & Plan (Justin [name] A. Kae Heller MD; 06/05/2017 12:59 PM)  BILATERAL INGUINAL HERNIA (K40.20) Story: I again offered him laparoscopic repair. We discussed in depth the technique of this operation, the risks and alternatives. He understands and is agreeable to proceed. We will need to hold the aspirin (can still take baby aspirin) for 1 week before [name]. Will get coags with preop labs. Will send Dr. Irene [name] a note as well.  This visit consisted entirely of counseling and education as well as coordination of care. I spent 30 minutes with Justin [name] in face to face consultation.

## 2017-06-05 NOTE — Telephone Encounter (Signed)
-----   Message from Brunetta Genera, MD sent at 06/05/2017  5:01 PM EDT ----- Aldona Bar, Plz confirm if Mr Justin [name] is taking B12 replacement - if not plz have him take OTC B12 sublingual 1000 micrograms daily. Thanks [place]

## 2017-06-05 NOTE — Telephone Encounter (Signed)
Pt said he is having a brief stabbing pain behind the right eye for the past couple of days. This is a new symptom. Pt was advised if he thought it had anything to do with a stroke to go to ED, he said he would not go. Pt was advised Dr Leonie Man is out of the office. He asked if the message could be sent to RN.

## 2017-06-05 NOTE — Telephone Encounter (Signed)
Rn call patient back about stabbing pain behind right eye for the past 3 days. Rn ask if he was having vision loss eye drainage, eye swelling. Pt stated it does not really involve the eye but its kind of behind the eye. Rn stated from Dr. Leonie Man notes he was not having any eye issues. Rn advised pt to seek his PCP for examination of the pain because Dr.Sethi is out of the office this week and next week. Pt was advised to call PCP for work up. Pt stated he will contact his PCP.

## 2017-06-09 NOTE — Telephone Encounter (Signed)
Followed up with patient concerning symptoms below per MD Irene Limbo. Patient states that he is feeling fine with no complaints of those symptoms over the weekend. Patient has not set an appt up with PCP yet. Patient instructed to call us with questions/concers. Patient verbalized understanding.

## 2017-06-11 LAB — CUP PACEART REMOTE DEVICE CHECK
Implantable Pulse Generator Implant Date: 20170613
MDC IDC SESS DTM: 20180609054154

## 2017-06-11 NOTE — Progress Notes (Signed)
Carelink summary report received. Battery status OK. Normal device function. No new symptom episodes, tachy episodes, brady, or pause episodes. No new AF episodes. Monthly summary reports and ROV/PRN 

## 2017-06-12 ENCOUNTER — Encounter: Payer: Self-pay | Admitting: Internal Medicine

## 2017-06-12 ENCOUNTER — Ambulatory Visit (INDEPENDENT_AMBULATORY_CARE_PROVIDER_SITE_OTHER): Payer: Medicare Other | Admitting: Internal Medicine

## 2017-06-12 VITALS — BP 122/64 | HR 59 | Ht 69.0 in | Wt 160.0 lb

## 2017-06-12 DIAGNOSIS — Z9889 Other specified postprocedural states: Secondary | ICD-10-CM | POA: Insufficient documentation

## 2017-06-12 DIAGNOSIS — E118 Type 2 diabetes mellitus with unspecified complications: Secondary | ICD-10-CM | POA: Diagnosis not present

## 2017-06-12 DIAGNOSIS — I10 Essential (primary) hypertension: Secondary | ICD-10-CM

## 2017-06-12 DIAGNOSIS — I2510 Atherosclerotic heart disease of native coronary artery without angina pectoris: Secondary | ICD-10-CM

## 2017-06-12 DIAGNOSIS — Q211 Atrial septal defect: Secondary | ICD-10-CM | POA: Diagnosis not present

## 2017-06-12 DIAGNOSIS — I639 Cerebral infarction, unspecified: Secondary | ICD-10-CM

## 2017-06-12 DIAGNOSIS — Q2112 Patent foramen ovale: Secondary | ICD-10-CM

## 2017-06-12 DIAGNOSIS — I253 Aneurysm of heart: Secondary | ICD-10-CM

## 2017-06-12 NOTE — Patient Instructions (Signed)
Your physician wants you to follow-up in: ONE YEAR with Dr. Hilty. You will receive a reminder letter in the mail two months in advance. If you don't receive a letter, please call our office to schedule the follow-up appointment.  

## 2017-06-12 NOTE — Progress Notes (Signed)
OFFICE NOTE  Chief Complaint:  Routine follow-up  Primary Care Physician: Mayra Neer, MD  HPI:  Justin [name] is a pleasant 76 year old male who I met recently in the hospital. He presented with chest pain that he developed while sitting his computer. The symptoms had some typical and atypical features for angina. He ruled out for MI and underwent nuclear stress testing which was negative for ischemia. During the stress test while exercising he developed an SVT with heart rate that shot up abruptly to 220. This responded to vagal maneuvers and then reoccurred and eventually subsided. He was started on low-dose beta blocker and eventually discharged without any recurrent symptoms. He was placed on a monitor and has follow-up with cardiac electrophysiology to see Dr. Lovena Le in January. 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 He was started on Lipitor which was started the hospital for for coronary disease, however his LDL is only 95. He is a diabetic. His metformin was increased to 500 twice a day which is improved his blood sugars. He recently saw his primary care provider felt that he was doing well.  I saw [name] back in the office today. He was referred to Dr. Cristopher Peru for SVT ablation consideration, and this was offered to him. Prior to having this scheduled he developed an episode of chest pain again and that brought him to the hospital yesterday. He was evaluated by Dr. Dorris Carnes, and had a right upper quadrant ultrasound which was unremarkable. He also had coronary CT angiogram which demonstrated mild to moderate coronary disease and an intermediate coronary calcium score of 202. He has subsequently worn a monitor between 12/21/2015 and 01/18/2015 which showed no recurrent SVT and normal sinus rhythm.  The etiology of his pain episodes is unclear however may be  related to reflux.   [name] returns today for follow-up. He reports he is done very well without any recurrent SVT status post catheter ablation by Dr. Cristopher Peru. He is seeing me primarily for risk factor modification and treatment of asymptomatic coronary artery disease which was seen by coronary artery CT angiography. He was found to have 2 areas of mild coronary artery disease and a calcium score which was moderately elevated at 202. Since then he's been on lipid therapy with Lipitor and takes daily aspirin as well as my card is HCTZ for hypertension. Blood pressure is well-controlled today 106/60. He is due for repeat lipid profile and will need a refill on his cholesterol medication. He denies any chest pain or shortness of breath. 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  06/06/2016  [name] returns today for follow-up. He was seen by myself in the hospital after he presented with symptoms of possible TIA. This is following recent hospitalization at [place] in [place] where he had a stroke. MRI confirmed this although he has had no significant lasting deficit. His initial symptoms were left arm heaviness and numbness. He also subsequently had some right arm tingling but the symptoms were different than his original presentation. This was thought to be TIA. He was evaluated by neurology however repeat imaging failed to show any new deficits. Subsequently he was referred for a TEE which I performed. This demonstrated a small bidirectional PFO. He had a bifid left atrial appendage  without any thrombus. While he did have PFO, it's not clear that this was the etiology of his stroke or TIA. Venous Dopplers of the legs were negative for thrombus. He was changed from aspirin to Plavix. We also recommended and implanted loop recorder which was placed by Dr. Rayann Heman. The thought is that he may be having intermittent atrial fibrillation, as he has a  history of SVT underwent ablation.  06/12/2017  [name] returns today for follow-up. He's had a very eventful year. 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 He's had problems with recurrent skin cancer. He has had no further stroke or TIA and has done well on aspirin and Plavix. Recently his neurologist he discontinued Plavix and is currently only on full dose aspirin. He did have an implanted loop recorder and multiple interrogations have failed to show any recurrent or intermittent atrial fibrillation. He denies any recurrent SVT. This is not been noted as well. Blood pressure is well controlled and he said recent weight loss which is appropriate at this point.  PMHx:  Past Medical History:  Diagnosis Date  . 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 Diabetes mellitus without complication ([place])   . GERD (gastroesophageal reflux disease)   . Gout   . History of loop recorder   . Hypertension   . Left leg weakness   . Paroxysmal SVT (supraventricular tachycardia) ([place])    a. s/p RFCA on 05/01/15  . Stroke [facility])     Past Surgical History:  Procedure Laterality Date  . BASAL CELL CARCINOMA EXCISION     off of back  . ELECTROPHYSIOLOGIC STUDY N/A 05/01/2015   Procedure: SVT Ablation;  Surgeon: Evans Lance, MD;  Location: [facility];  Service: Cardiovascular;  Laterality: N/A;  . EP IMPLANTABLE DEVICE N/A 06/04/2016   Procedure: Loop Recorder Insertion;  Surgeon: Thompson Grayer, MD;  Location: [facility];  Service: Cardiovascular;  Laterality: N/A;  . SQUAMOUS CELL CARCINOMA EXCISION    . TEE WITHOUT CARDIOVERSION N/A 06/04/2016   Procedure: TRANSESOPHAGEAL ECHOCARDIOGRAM (TEE);  Surgeon: Pixie Casino, MD;  Location: [facility] ENDOSCOPY;  Service: Cardiovascular;  Laterality: N/A;    FAMHx:  Family History  Problem Relation Age of Onset  . Stroke Mother   . Hypertension Mother   . Alzheimer's disease Father    . Heart failure Father   . Down syndrome Daughter     SOCHx:   reports that he has never smoked. He has never used smokeless tobacco. He reports that he does not drink alcohol or use drugs.  ALLERGIES:  No Known Allergies  ROS: Pertinent items noted in HPI and remainder of comprehensive ROS otherwise negative.  HOME MEDS: Current Outpatient Prescriptions  Medication Sig Dispense Refill  . allopurinol (ZYLOPRIM) 300 MG tablet Take 300 mg by mouth.    [name] Kitchen aspirin 325 MG tablet Take 1 tablet (325 mg total) by mouth daily. 90 tablet 0  . atorvastatin (LIPITOR) 40 MG tablet Take 1 tablet (40 mg total) by mouth every morning. 90 tablet 3  . fluorouracil (EFUDEX) 5 % cream Apply 1 application topically daily as needed (for skin cancer prevention).   0  . metFORMIN (GLUCOPHAGE) 1000 MG tablet Take 1,000 mg by mouth daily with breakfast.    . nitroGLYCERIN (NITROSTAT) 0.4 MG SL tablet Place 1 tablet (0.4 mg total) under the tongue every 5 (five) minutes as needed for  chest pain. 25 tablet 3  . omeprazole (PRILOSEC) 20 MG capsule Take 20 mg by mouth every morning.     [name] Kitchen telmisartan-hydrochlorothiazide (MICARDIS HCT) 80-12.5 MG tablet Take 1 tablet by mouth daily. 90 tablet 3  . vitamin B-12 (CYANOCOBALAMIN) 1000 MCG tablet Take 1,000 mcg by mouth daily.     No current facility-administered medications for this visit.     LABS/IMAGING: No results found for this or any previous visit (from the past 48 hour(s)). No results found.  VITALS: BP 122/64   Pulse (!) 59   Ht 5' 9" (1.753 m)   Wt 160 lb (72.6 kg)   BMI 23.63 kg/m   EXAM: General appearance: alert and no distress Neck: no carotid bruit and no JVD Lungs: clear to auscultation bilaterally Heart: regular rate and rhythm, S1, S2 normal, no murmur, click, rub or gallop Abdomen: soft, non-tender; bowel sounds normal; no masses,  no organomegaly Extremities: extremities normal, atraumatic, no cyanosis or edema Pulses: 2+ and  symmetric Skin: Skin color, texture, turgor normal. No rashes or lesions Neurologic: Grossly normal Psych: Pleasant  EKG: Sinus bradycardia 59  ASSESSMENT: 1. Stroke and TIA, status post ILR - no evidence for A. fib or SVT 2. Small bidirectional PFO by TEE 3. Mild CAD - coronary CT angiogram showed mild to moderate coronary disease which was nonobstructive and an intermediate coronary calcium score of 202. 4. PSVT s/p ablation by Dr. Lovena Le 5. Diabetes type 2 6. Relative dyslipidemia 7. Multiple myeloma 8. Skin cancer  PLAN: 1.   Mr. Luhmann seems to be doing well without any recurrent stroke. His loop recorder is felt to show any recurrent SVT or atrial fibrillation. He's currently on full dose aspirin. He had mild coronary artery disease which were managing medically. He's maintained on low-dose metformin and a statin. He recently was diagnosed with multiple myeloma but is not is on treatment for that yet.   Follow-up with me in annually or sooner as necessary.  Pixie Casino, MD, [facility] Attending Cardiologist [place] [facility]  06/12/2017, 1:02 PM

## 2017-06-13 ENCOUNTER — Encounter (HOSPITAL_COMMUNITY): Payer: Self-pay | Admitting: *Deleted

## 2017-06-13 NOTE — Progress Notes (Signed)
Spoke with pt for pre-op call. Pt has hx of fast heart rate, a-fib and has a loop recorder. Saw Dr. Debara Pickett yesterday and pt states he was told that he was fine to have surgery. Dr. Leonie Man has given neuro clearance for pt on 06/06/17. Pt is diabetic, type 2. Last A1C was 7.0 three months ago per pt. He states his fasting blood sugar is usually <100. Instructed pt not to take metformin day of surgery. Instructed pt to check his blood sugar when he gets up on Monday and to check it every 2 hours until he leaves for the hospital. If blood sugar is 70 or below, treat with 1/2 cup of clear juice (apple or cranberry) and recheck blood sugar 15 minutes after drinking juice. If blood sugar continues to be 70 or below, call the Short Stay department and ask to speak to a nurse.

## 2017-06-16 ENCOUNTER — Ambulatory Visit (HOSPITAL_COMMUNITY): Payer: Medicare Other

## 2017-06-16 ENCOUNTER — Encounter (HOSPITAL_COMMUNITY)
Admission: RE | Disposition: A | Discharge status: Home / Self Care | Payer: Self-pay | Source: Ambulatory Visit | Attending: Surgery

## 2017-06-16 ENCOUNTER — Ambulatory Visit (HOSPITAL_COMMUNITY)
Admission: RE | Admit: 2017-06-16 | Discharge: 2017-06-16 | Disposition: A | Discharge status: Home / Self Care | Payer: Medicare Other | Source: Ambulatory Visit | Attending: Surgery | Admitting: Surgery

## 2017-06-16 ENCOUNTER — Encounter (HOSPITAL_COMMUNITY): Payer: Self-pay | Admitting: General Practice

## 2017-06-16 ENCOUNTER — Ambulatory Visit (HOSPITAL_COMMUNITY): Payer: Medicare Other | Admitting: Certified Registered Nurse Anesthetist

## 2017-06-16 DIAGNOSIS — D176 Benign lipomatous neoplasm of spermatic cord: Secondary | ICD-10-CM | POA: Insufficient documentation

## 2017-06-16 DIAGNOSIS — Z79899 Other long term (current) drug therapy: Secondary | ICD-10-CM | POA: Diagnosis not present

## 2017-06-16 DIAGNOSIS — E119 Type 2 diabetes mellitus without complications: Secondary | ICD-10-CM | POA: Diagnosis not present

## 2017-06-16 DIAGNOSIS — Z7984 Long term (current) use of oral hypoglycemic drugs: Secondary | ICD-10-CM | POA: Diagnosis not present

## 2017-06-16 DIAGNOSIS — I2510 Atherosclerotic heart disease of native coronary artery without angina pectoris: Secondary | ICD-10-CM | POA: Diagnosis not present

## 2017-06-16 DIAGNOSIS — Z7982 Long term (current) use of aspirin: Secondary | ICD-10-CM | POA: Insufficient documentation

## 2017-06-16 DIAGNOSIS — K4020 Bilateral inguinal hernia, without obstruction or gangrene, not specified as recurrent: Secondary | ICD-10-CM | POA: Insufficient documentation

## 2017-06-16 DIAGNOSIS — I471 Supraventricular tachycardia: Secondary | ICD-10-CM | POA: Diagnosis not present

## 2017-06-16 DIAGNOSIS — I10 Essential (primary) hypertension: Secondary | ICD-10-CM | POA: Insufficient documentation

## 2017-06-16 DIAGNOSIS — K219 Gastro-esophageal reflux disease without esophagitis: Secondary | ICD-10-CM | POA: Diagnosis not present

## 2017-06-16 DIAGNOSIS — K4190 Unilateral femoral hernia, without obstruction or gangrene, not specified as recurrent: Secondary | ICD-10-CM | POA: Insufficient documentation

## 2017-06-16 DIAGNOSIS — I69354 Hemiplegia and hemiparesis following cerebral infarction affecting left non-dominant side: Secondary | ICD-10-CM | POA: Diagnosis not present

## 2017-06-16 DIAGNOSIS — Z01810 Encounter for preprocedural cardiovascular examination: Secondary | ICD-10-CM

## 2017-06-16 HISTORY — PX: INSERTION OF MESH: SHX5868

## 2017-06-16 HISTORY — PX: INGUINAL HERNIA REPAIR: SHX194

## 2017-06-16 HISTORY — DX: Unspecified osteoarthritis, unspecified site: M19.90

## 2017-06-16 HISTORY — DX: Anemia, unspecified: D64.9

## 2017-06-16 LAB — CBC WITH DIFFERENTIAL/PLATELET
BASOS ABS: 0 10*3/uL (ref 0.0–0.1)
Basophils Relative: 1 %
EOS ABS: 0.1 10*3/uL (ref 0.0–0.7)
EOS PCT: 1 %
HEMATOCRIT: 34.1 % — AB (ref 39.0–52.0)
Hemoglobin: 11 g/dL — ABNORMAL LOW (ref 13.0–17.0)
Lymphocytes Relative: 20 %
Lymphs Abs: 0.9 10*3/uL (ref 0.7–4.0)
MCH: 28.8 pg (ref 26.0–34.0)
MCHC: 32.3 g/dL (ref 30.0–36.0)
MCV: 89.3 fL (ref 78.0–100.0)
MONO ABS: 0.2 10*3/uL (ref 0.1–1.0)
MONOS PCT: 4 %
NEUTROS ABS: 3.2 10*3/uL (ref 1.7–7.7)
Neutrophils Relative %: 74 %
PLATELETS: 200 10*3/uL (ref 150–400)
RBC: 3.82 MIL/uL — ABNORMAL LOW (ref 4.22–5.81)
RDW: 17.9 % — AB (ref 11.5–15.5)
WBC: 4.3 10*3/uL (ref 4.0–10.5)

## 2017-06-16 LAB — COMPREHENSIVE METABOLIC PANEL
ALBUMIN: 4.2 g/dL (ref 3.5–5.0)
ALT: 8 U/L — ABNORMAL LOW (ref 17–63)
ANION GAP: 8 (ref 5–15)
AST: 22 U/L (ref 15–41)
Alkaline Phosphatase: 58 U/L (ref 38–126)
BILIRUBIN TOTAL: 0.6 mg/dL (ref 0.3–1.2)
BUN: 22 mg/dL — ABNORMAL HIGH (ref 6–20)
CHLORIDE: 102 mmol/L (ref 101–111)
CO2: 26 mmol/L (ref 22–32)
Calcium: 9.3 mg/dL (ref 8.9–10.3)
Creatinine, Ser: 1.14 mg/dL (ref 0.61–1.24)
GFR calc Af Amer: >60 mL/min (ref >60)
GFR calc non Af Amer: >60 mL/min (ref >60)
Glucose, Bld: 117 mg/dL — ABNORMAL HIGH (ref 65–99)
POTASSIUM: 3.9 mmol/L (ref 3.5–5.1)
Sodium: 136 mmol/L (ref 135–145)
TOTAL PROTEIN: 9.2 g/dL — AB (ref 6.5–8.1)

## 2017-06-16 LAB — GLUCOSE, CAPILLARY
GLUCOSE-CAPILLARY: 122 mg/dL — AB (ref 65–99)
Glucose-Capillary: 96 mg/dL (ref 65–99)

## 2017-06-16 LAB — PROTIME-INR
INR: 0.99
PROTHROMBIN TIME: 13.1 s (ref 11.4–15.2)

## 2017-06-16 LAB — APTT: APTT: 36 s (ref 24–36)

## 2017-06-16 SURGERY — REPAIR, HERNIA, INGUINAL, BILATERAL, LAPAROSCOPIC
Anesthesia: General | Site: Groin | Laterality: Bilateral

## 2017-06-16 MED ORDER — BUPIVACAINE-EPINEPHRINE (PF) 0.25% -1:200000 IJ SOLN
INTRAMUSCULAR | Status: AC
  Filled 2017-06-16: qty 30

## 2017-06-16 MED ORDER — ONDANSETRON HCL 4 MG/2ML IJ SOLN
INTRAMUSCULAR | Status: AC
  Filled 2017-06-16: qty 2

## 2017-06-16 MED ORDER — EPHEDRINE SULFATE 50 MG/ML IJ SOLN
INTRAMUSCULAR | Status: DC | PRN
  Administered 2017-06-16: 5 mg via INTRAVENOUS

## 2017-06-16 MED ORDER — SODIUM CHLORIDE 0.9 % IV SOLN: 250.0000 mL | INTRAVENOUS | Status: DC | PRN

## 2017-06-16 MED ORDER — GABAPENTIN 300 MG PO CAPS
300.0000 mg | ORAL_CAPSULE | ORAL | Status: AC
  Administered 2017-06-16: 300 mg via ORAL
  Filled 2017-06-16: qty 1

## 2017-06-16 MED ORDER — ACETAMINOPHEN 650 MG RE SUPP: 650.0000 mg | RECTAL | Status: DC | PRN

## 2017-06-16 MED ORDER — PHENYLEPHRINE HCL 10 MG/ML IJ SOLN
INTRAMUSCULAR | Status: DC | PRN
  Administered 2017-06-16 (×2): 80 ug via INTRAVENOUS

## 2017-06-16 MED ORDER — MIDAZOLAM HCL 2 MG/2ML IJ SOLN
INTRAMUSCULAR | Status: AC
  Filled 2017-06-16: qty 2

## 2017-06-16 MED ORDER — 0.9 % SODIUM CHLORIDE (POUR BTL) OPTIME
TOPICAL | Status: DC | PRN
  Administered 2017-06-16: 1000 mL

## 2017-06-16 MED ORDER — SODIUM CHLORIDE 0.9 % IR SOLN
Status: DC | PRN
  Administered 2017-06-16: 1000 mL

## 2017-06-16 MED ORDER — KETOROLAC TROMETHAMINE 30 MG/ML IJ SOLN
INTRAMUSCULAR | Status: AC
  Filled 2017-06-16: qty 2

## 2017-06-16 MED ORDER — CHLORHEXIDINE GLUCONATE 4 % EX LIQD: 60.0000 mL | Freq: Once | CUTANEOUS | Status: DC

## 2017-06-16 MED ORDER — PROPOFOL 10 MG/ML IV BOLUS
INTRAVENOUS | Status: DC | PRN
  Administered 2017-06-16: 150 mg via INTRAVENOUS

## 2017-06-16 MED ORDER — LIDOCAINE HCL (CARDIAC) 20 MG/ML IV SOLN
INTRAVENOUS | Status: DC | PRN
  Administered 2017-06-16: 60 mg via INTRAVENOUS

## 2017-06-16 MED ORDER — SODIUM CHLORIDE 0.9% FLUSH: 3.0000 mL | INTRAVENOUS | Status: DC | PRN

## 2017-06-16 MED ORDER — PROMETHAZINE HCL 25 MG/ML IJ SOLN: 6.2500 mg | INTRAMUSCULAR | Status: DC | PRN

## 2017-06-16 MED ORDER — SUCCINYLCHOLINE CHLORIDE 200 MG/10ML IV SOSY
PREFILLED_SYRINGE | INTRAVENOUS | Status: AC
  Filled 2017-06-16: qty 10

## 2017-06-16 MED ORDER — CELECOXIB 200 MG PO CAPS
400.0000 mg | ORAL_CAPSULE | ORAL | Status: AC
  Administered 2017-06-16: 400 mg via ORAL
  Filled 2017-06-16: qty 2

## 2017-06-16 MED ORDER — PHENYLEPHRINE 40 MCG/ML (10ML) SYRINGE FOR IV PUSH (FOR BLOOD PRESSURE SUPPORT)
PREFILLED_SYRINGE | INTRAVENOUS | Status: AC
  Filled 2017-06-16: qty 10

## 2017-06-16 MED ORDER — LIDOCAINE 2% (20 MG/ML) 5 ML SYRINGE
INTRAMUSCULAR | Status: AC
Start: 2017-06-16 — End: 2017-06-16
  Filled 2017-06-16: qty 15

## 2017-06-16 MED ORDER — ONDANSETRON HCL 4 MG/2ML IJ SOLN
INTRAMUSCULAR | Status: DC | PRN
  Administered 2017-06-16: 4 mg via INTRAVENOUS

## 2017-06-16 MED ORDER — SODIUM CHLORIDE 0.9% FLUSH: 3.0000 mL | Freq: Two times a day (BID) | INTRAVENOUS | Status: DC

## 2017-06-16 MED ORDER — OXYCODONE HCL 5 MG PO TABS: 5.0000 mg | ORAL_TABLET | Freq: Once | ORAL | Status: DC | PRN

## 2017-06-16 MED ORDER — BUPIVACAINE-EPINEPHRINE 0.25% -1:200000 IJ SOLN
INTRAMUSCULAR | Status: DC | PRN
  Administered 2017-06-16: 25 mL

## 2017-06-16 MED ORDER — FENTANYL CITRATE (PF) 100 MCG/2ML IJ SOLN
INTRAMUSCULAR | Status: DC | PRN
  Administered 2017-06-16 (×3): 50 ug via INTRAVENOUS

## 2017-06-16 MED ORDER — OXYCODONE HCL 5 MG/5ML PO SOLN: 5.0000 mg | Freq: Once | ORAL | Status: DC | PRN

## 2017-06-16 MED ORDER — ONDANSETRON HCL 4 MG/2ML IJ SOLN
INTRAMUSCULAR | Status: AC
  Filled 2017-06-16: qty 4

## 2017-06-16 MED ORDER — OXYCODONE HCL 5 MG PO TABS: 5.0000 mg | ORAL_TABLET | ORAL | Status: DC | PRN

## 2017-06-16 MED ORDER — HYDROMORPHONE HCL 1 MG/ML IJ SOLN: 0.2500 mg | INTRAMUSCULAR | Status: DC | PRN

## 2017-06-16 MED ORDER — MIDAZOLAM HCL 5 MG/5ML IJ SOLN
INTRAMUSCULAR | Status: DC | PRN
  Administered 2017-06-16: 2 mg via INTRAVENOUS

## 2017-06-16 MED ORDER — EPHEDRINE 5 MG/ML INJ
INTRAVENOUS | Status: AC
  Filled 2017-06-16: qty 20

## 2017-06-16 MED ORDER — DEXAMETHASONE SODIUM PHOSPHATE 10 MG/ML IJ SOLN
INTRAMUSCULAR | Status: AC
  Filled 2017-06-16: qty 1

## 2017-06-16 MED ORDER — ROCURONIUM BROMIDE 100 MG/10ML IV SOLN
INTRAVENOUS | Status: DC | PRN
  Administered 2017-06-16: 40 mg via INTRAVENOUS
  Administered 2017-06-16 (×2): 10 mg via INTRAVENOUS

## 2017-06-16 MED ORDER — LACTATED RINGERS IV SOLN
INTRAVENOUS | Status: DC
  Administered 2017-06-16 (×2): via INTRAVENOUS

## 2017-06-16 MED ORDER — SUCCINYLCHOLINE CHLORIDE 200 MG/10ML IV SOSY
PREFILLED_SYRINGE | INTRAVENOUS | Status: DC | PRN
  Administered 2017-06-16: 120 mg via INTRAVENOUS

## 2017-06-16 MED ORDER — SUGAMMADEX SODIUM 200 MG/2ML IV SOLN
INTRAVENOUS | Status: DC | PRN
  Administered 2017-06-16: 150 mg via INTRAVENOUS

## 2017-06-16 MED ORDER — EPHEDRINE 5 MG/ML INJ
INTRAVENOUS | Status: AC
  Filled 2017-06-16: qty 10

## 2017-06-16 MED ORDER — ACETAMINOPHEN 325 MG PO TABS: 650.0000 mg | ORAL_TABLET | ORAL | Status: DC | PRN

## 2017-06-16 MED ORDER — TRAMADOL HCL 50 MG PO TABS: 50.0000 mg | ORAL_TABLET | Freq: Four times a day (QID) | ORAL | 0 refills | Status: DC | PRN

## 2017-06-16 MED ORDER — SUGAMMADEX SODIUM 200 MG/2ML IV SOLN
INTRAVENOUS | Status: AC
  Filled 2017-06-16: qty 4

## 2017-06-16 MED ORDER — CEFAZOLIN SODIUM-DEXTROSE 2-4 GM/100ML-% IV SOLN
2.0000 g | INTRAVENOUS | Status: AC
  Administered 2017-06-16: 2 g via INTRAVENOUS

## 2017-06-16 MED ORDER — ROCURONIUM BROMIDE 10 MG/ML (PF) SYRINGE
PREFILLED_SYRINGE | INTRAVENOUS | Status: AC
  Filled 2017-06-16: qty 5

## 2017-06-16 MED ORDER — DOCUSATE SODIUM 100 MG PO CAPS: 100.0000 mg | ORAL_CAPSULE | Freq: Two times a day (BID) | ORAL | 0 refills | Status: AC

## 2017-06-16 MED ORDER — ACETAMINOPHEN 500 MG PO TABS
1000.0000 mg | ORAL_TABLET | ORAL | Status: AC
  Administered 2017-06-16: 1000 mg via ORAL
  Filled 2017-06-16: qty 2

## 2017-06-16 MED ORDER — PROPOFOL 10 MG/ML IV BOLUS
INTRAVENOUS | Status: AC
  Filled 2017-06-16: qty 20

## 2017-06-16 MED ORDER — DEXAMETHASONE SODIUM PHOSPHATE 10 MG/ML IJ SOLN
INTRAMUSCULAR | Status: DC | PRN
  Administered 2017-06-16: 5 mg via INTRAVENOUS

## 2017-06-16 MED ORDER — FENTANYL CITRATE (PF) 250 MCG/5ML IJ SOLN
INTRAMUSCULAR | Status: AC
  Filled 2017-06-16: qty 5

## 2017-06-16 SURGICAL SUPPLY — 60 items
APPLIER CLIP LOGIC TI 5 (MISCELLANEOUS) IMPLANT
BENZOIN TINCTURE PRP APPL 2/3 (GAUZE/BANDAGES/DRESSINGS) IMPLANT
BLADE CLIPPER SURG (BLADE) ×2 IMPLANT
BLADE SURG 15 STRL LF DISP TIS (BLADE) ×1 IMPLANT
BLADE SURG 15 STRL SS (BLADE) ×1
CANISTER SUCT 3000ML PPV (MISCELLANEOUS) IMPLANT
CHLORAPREP W/TINT 26ML (MISCELLANEOUS) ×2 IMPLANT
CLSR STERI-STRIP ANTIMIC 1/2X4 (GAUZE/BANDAGES/DRESSINGS) IMPLANT
COVER SURGICAL LIGHT HANDLE (MISCELLANEOUS) ×2 IMPLANT
DERMABOND ADVANCED (GAUZE/BANDAGES/DRESSINGS) ×1
DERMABOND ADVANCED .7 DNX12 (GAUZE/BANDAGES/DRESSINGS) ×1 IMPLANT
DEVICE SECURE STRAP 25 ABSORB (INSTRUMENTS) ×4 IMPLANT
DISSECT BALLN SPACEMKR + OVL (BALLOONS)
DISSECTOR BALLN SPACEMKR + OVL (BALLOONS) IMPLANT
DISSECTOR BLUNT TIP ENDO 5MM (MISCELLANEOUS) IMPLANT
DRAPE LAPAROTOMY 100X72 PEDS (DRAPES) IMPLANT
DRAPE UTILITY XL STRL (DRAPES) IMPLANT
DRSG TEGADERM 2-3/8X2-3/4 SM (GAUZE/BANDAGES/DRESSINGS) IMPLANT
DRSG TEGADERM 4X4.75 (GAUZE/BANDAGES/DRESSINGS) ×2 IMPLANT
ELECT CAUTERY BLADE 6.4 (BLADE) IMPLANT
ELECT REM PT RETURN 9FT ADLT (ELECTROSURGICAL) ×2
ELECTRODE REM PT RTRN 9FT ADLT (ELECTROSURGICAL) ×1 IMPLANT
GAUZE SPONGE 2X2 8PLY STRL LF (GAUZE/BANDAGES/DRESSINGS) IMPLANT
GAUZE SPONGE 4X4 16PLY XRAY LF (GAUZE/BANDAGES/DRESSINGS) IMPLANT
GLOVE BIO SURGEON STRL SZ 6 (GLOVE) ×2 IMPLANT
GLOVE BIOGEL PI IND STRL 6.5 (GLOVE) ×1 IMPLANT
GLOVE BIOGEL PI INDICATOR 6.5 (GLOVE) ×1
GOWN STRL REUS W/ TWL LRG LVL3 (GOWN DISPOSABLE) ×2 IMPLANT
GOWN STRL REUS W/TWL LRG LVL3 (GOWN DISPOSABLE) ×2
GRASPER SUT TROCAR 14GX15 (MISCELLANEOUS) ×2 IMPLANT
KIT BASIN OR (CUSTOM PROCEDURE TRAY) ×2 IMPLANT
KIT ROOM TURNOVER OR (KITS) ×2 IMPLANT
MESH 3DMAX LIGHT 4.1X6.2 LT LR (Mesh General) ×2 IMPLANT
MESH 3DMAX LIGHT 4.1X6.2 RT LR (Mesh General) ×2 IMPLANT
NEEDLE HYPO 25GX1X1/2 BEV (NEEDLE) ×2 IMPLANT
NEEDLE INSUFFLATION 14GA 120MM (NEEDLE) ×2 IMPLANT
NS IRRIG 1000ML POUR BTL (IV SOLUTION) ×2 IMPLANT
PACK LAPAROSCOPY I 1258 (SET/KITS/TRAYS/PACK) ×2 IMPLANT
PACK SURGICAL SETUP 50X90 (CUSTOM PROCEDURE TRAY) IMPLANT
PAD ARMBOARD 7.5X6 YLW CONV (MISCELLANEOUS) ×2 IMPLANT
PENCIL BUTTON HOLSTER BLD 10FT (ELECTRODE) IMPLANT
SCISSORS LAP 5X35 DISP (ENDOMECHANICALS) IMPLANT
SET IRRIG TUBING LAPAROSCOPIC (IRRIGATION / IRRIGATOR) ×2 IMPLANT
SET TROCAR LAP APPLE-HUNT 5MM (ENDOMECHANICALS) IMPLANT
SLEEVE ENDOPATH XCEL 5M (ENDOMECHANICALS) ×2 IMPLANT
SPONGE GAUZE 2X2 STER 10/PKG (GAUZE/BANDAGES/DRESSINGS)
SUT ETHIBOND 0 MO6 C/R (SUTURE) IMPLANT
SUT MNCRL AB 4-0 PS2 18 (SUTURE) ×2 IMPLANT
SUT VIC AB 3-0 SH 27 (SUTURE)
SUT VIC AB 3-0 SH 27X BRD (SUTURE) IMPLANT
SYR BULB 3OZ (MISCELLANEOUS) IMPLANT
SYR CONTROL 10ML LL (SYRINGE) ×2 IMPLANT
TOWEL OR 17X24 6PK STRL BLUE (TOWEL DISPOSABLE) ×2 IMPLANT
TOWEL OR 17X26 10 PK STRL BLUE (TOWEL DISPOSABLE) IMPLANT
TRAY FOLEY CATH SILVER 16FR (SET/KITS/TRAYS/PACK) ×2 IMPLANT
TROCAR XCEL 12X100 BLDLESS (ENDOMECHANICALS) ×2 IMPLANT
TROCAR XCEL NON-BLD 5MMX100MML (ENDOMECHANICALS) ×2 IMPLANT
TUBE CONNECTING 12X1/4 (SUCTIONS) IMPLANT
TUBING INSUFFLATION (TUBING) ×2 IMPLANT
YANKAUER SUCT BULB TIP NO VENT (SUCTIONS) IMPLANT

## 2017-06-16 NOTE — Anesthesia Postprocedure Evaluation (Signed)
Anesthesia Post Note  Patient: Justin [name]  Procedure(s) Performed: Procedure(s) (LRB): LAPAROSCOPIC BILATERAL INGUINAL HERNIA REPAIR (Bilateral) INSERTION OF MESH (Bilateral)     Patient location during evaluation: PACU Anesthesia Type: General Level of consciousness: awake and alert Pain management: pain level controlled Vital Signs Assessment: post-procedure vital signs reviewed and stable Respiratory status: spontaneous breathing, nonlabored ventilation, respiratory function stable and patient connected to nasal cannula oxygen Cardiovascular status: blood pressure returned to baseline and stable Postop Assessment: no signs of nausea or vomiting Anesthetic complications: no    Last Vitals:  Vitals:   06/16/17 1625 06/16/17 1645  BP: 109/70 116/74  Pulse: (!) 57 (!) 56  Resp: 14 16  Temp: 36.1 C     Last Pain:  Vitals:   06/16/17 1645  TempSrc:   PainSc: 2                  Nimesh Riolo P Chantia Amalfitano

## 2017-06-16 NOTE — Anesthesia Preprocedure Evaluation (Addendum)
Anesthesia Evaluation  Patient identified by MRN, date of birth, ID band Patient awake    Reviewed: Allergy & Precautions, NPO status , Patient's Chart, lab work & pertinent test results  Airway Mallampati: IV  TM Distance: >3 FB Neck ROM: Full    Dental no notable dental hx.    Pulmonary neg pulmonary ROS,    Pulmonary exam normal breath sounds clear to auscultation       Cardiovascular hypertension, Pt. on medications + CAD  Normal cardiovascular exam Rhythm:Regular Rate:Normal  ECG: SB, rate 69  ECHO:- Left ventricle: The cavity size was normal. Wall thickness was normal. Systolic function was normal. The estimated ejection fraction was in the range of 60% to 65%. Wall motion was normal; there were no regional wall motion abnormalities. Doppler parameters are consistent with abnormal left ventricular relaxation (grade 1 diastolic dysfunction). - Mitral valve: Calcified annulus.  Sees cardiologist ([place])     Neuro/Psych TIACVA (left leg weakness), Residual Symptoms negative psych ROS   GI/Hepatic Neg liver ROS, GERD  Medicated and Controlled,  Endo/Other  diabetes, Well Controlled, Oral Hypoglycemic Agents  Renal/GU negative Renal ROS  negative genitourinary   Musculoskeletal negative musculoskeletal ROS (+)   Abdominal   Peds negative pediatric ROS (+)  Hematology  (+) anemia ,   Anesthesia Other Findings Gout Hyperlipidemia  Loop recorder  Reproductive/Obstetrics negative OB ROS                            Anesthesia Physical Anesthesia Plan  ASA: III  Anesthesia Plan: General   Post-op Pain Management:    Induction: Intravenous  PONV Risk Score and Plan: 2 and Ondansetron, Dexamethasone and Propofol  Airway Management Planned: Oral ETT  Additional Equipment:   Intra-op Plan:   Post-operative Plan: Extubation in OR  Informed Consent: I have reviewed the patients  History and Physical, chart, labs and discussed the procedure including the risks, benefits and alternatives for the proposed anesthesia with the patient or authorized representative who has indicated his/her understanding and acceptance.   Dental advisory given  Plan Discussed with: CRNA  Anesthesia Plan Comments:         Anesthesia Quick Evaluation

## 2017-06-16 NOTE — Discharge Instructions (Signed)
LAPAROSCOPIC SURGERY: POST OP INSTRUCTIONS  ######################################################################  EAT Gradually transition to a high fiber diet with a fiber supplement over the next few weeks after discharge.  Start with a pureed / full liquid diet (see below)  WALK Walk an hour a day.  Control your pain to do that.    CONTROL PAIN Control pain so that you can walk, sleep, tolerate sneezing/coughing, go up/down stairs.  HAVE A BOWEL MOVEMENT DAILY Keep your bowels regular to avoid problems.  OK to try a laxative to override constipation.  OK to use an antidairrheal to slow down diarrhea.  Call if not better after 2 tries  CALL IF YOU HAVE PROBLEMS/CONCERNS Call if you are still struggling despite following these instructions. Call if you have concerns not answered by these instructions  ######################################################################    1. DIET: Follow a light bland diet the first 24 hours after arrival home, such as soup, liquids, crackers, etc.  Be sure to include lots of fluids daily.  Avoid fast food or heavy meals as your are more likely to get nauseated.  Eat a low fat the next few days after surgery.   2. Take your usually prescribed home medications unless otherwise directed. 3. PAIN CONTROL: a. Pain is best controlled by a usual combination of three different methods TOGETHER: i. Ice/Heat ii. Over the counter pain medication iii. Prescription pain medication b. Most patients will experience some swelling and bruising around the incisions and in the groin/scrotum.  Ice packs or heating pads (30-60 minutes up to 6 times a day) will help. Use ice for the first few days to help decrease swelling and bruising, then switch to heat to help relax tight/sore spots and speed recovery.  Some people prefer to use ice alone, heat alone, alternating between ice & heat.  Experiment to what works for you.  Swelling and bruising can take several weeks  to resolve.   c. It is helpful to take an over-the-counter pain medication regularly for the first few weeks.  Choose one of the following that works best for you: i. Naproxen (Aleve, etc)  Two 220mg  tabs twice a day ii. Ibuprofen (Advil, etc) Three 200mg  tabs four times a day (every meal & bedtime) iii. Acetaminophen (Tylenol, etc) 500-650mg  four times a day (every meal & bedtime) d. A  prescription for pain medication (such as oxycodone, hydrocodone, etc) should be given to you upon discharge.  Take your pain medication as prescribed.  i. If you are having problems/concerns with the prescription medicine (does not control pain, nausea, vomiting, rash, itching, etc), please call us [phone] to see if we need to switch you to a different pain medicine that will work better for you and/or control your side effect better. ii. If you need a refill on your pain medication, please contact your pharmacy.  They will contact our office to request authorization. Prescriptions will not be filled after 5 pm or on week-ends. 4. Avoid getting constipated.  Between the surgery and the pain medications, it is common to experience some constipation.  Increasing fluid intake and taking a fiber supplement (such as Metamucil, Citrucel, FiberCon, MiraLax, etc) 1-2 times a day regularly will usually help prevent this problem from occurring.  A mild laxative (prune juice, Milk of Magnesia, MiraLax, etc) should be taken according to package directions if there are no bowel movements after 48 hours.   5. Watch out for diarrhea.  If you have many loose bowel movements, simplify your diet to bland  foods & liquids for a few days.  Stop any stool softeners and decrease your fiber supplement.  Switching to mild anti-diarrheal medications (Kayopectate, Pepto Bismol) can help.  If this worsens or does not improve, please call us. 6. Wash / shower every day.  You may shower over the skin glue as it is waterproof.  Continue to  shower over incision(s) after the dressing is off. 7. RSkin glue will flake off after about two weeks.  You may leave the incision open to air.  You may replace a dressing/Band-Aid to cover the incision for comfort if you wish.  8. ACTIVITIES as tolerated:   a. You may resume regular (light) daily activities beginning the next day--such as daily self-care, walking, climbing stairs--gradually increasing activities as tolerated.  If you can walk 30 minutes without difficulty, it is safe to try more intense activity such as jogging, treadmill, bicycling, low-impact aerobics, swimming, etc. b. Save the most intensive and strenuous activity for last such as sit-ups, heavy lifting, contact sports, etc  Refrain from any heavy lifting or straining until 6 weeks after surgery.   c. DO NOT PUSH THROUGH PAIN.  Let pain be your guide: If it hurts to do something, don't do it.  Pain is your body warning you to avoid that activity for another week until the pain goes down. d. You may drive when you are no longer taking prescription pain medication, you can comfortably wear a seatbelt, and you can safely maneuver your car and apply brakes. e. Dennis Bast may have sexual intercourse when it is comfortable.  9. FOLLOW UP in our office a. Please call [facility] at [phone] to set up an appointment to see your surgeon in the office for a follow-up appointment approximately 2-3 weeks after your surgery. b. Make sure that you call for this appointment the day you arrive home to insure a convenient appointment time. 10. IF YOU HAVE DISABILITY OR FAMILY LEAVE FORMS, BRING THEM TO THE OFFICE FOR PROCESSING.  DO NOT GIVE THEM TO YOUR DOCTOR.   WHEN TO CALL us [phone]: 1. Poor pain control 2. Reactions / problems with new medications (rash/itching, nausea, etc)  3. Fever over 101.5 F (38.5 C) 4. Inability to urinate 5. Nausea and/or vomiting 6. Worsening swelling or bruising 7. Continued bleeding from  incision. 8. Increased pain, redness, or drainage from the incision   The clinic staff is available to answer your questions during regular business hours (8:30am-5pm).  Please dont hesitate to call and ask to speak to one of our nurses for clinical concerns.   If you have a medical emergency, go to the nearest emergency room or call 911.  A surgeon from [facility] is always on call at the [facility], [place], [place], [place], [place]  [address] ? MAIN: [phone] ? TOLL FREE: [phone] ?  FAX [phone] [email or web address]

## 2017-06-16 NOTE — Op Note (Signed)
Operative Note  Justin [name]  [id number]  [id number]  06/16/2017   Surgeon: Clovis Riley  Assistant: OR staff  Procedure performed: Laparoscopic repair of reducible large right indirect inguinal hernia with mesh; laparoscopic repair of reducible left femoral, direct and indirect inguinal hernias with mesh  Preop diagnosis: Right and left inguinal hernias Post-op diagnosis/intraop findings: large reducible right inguinal hernia with cord lipoma, reducible left femoral, direct and indirect hernia  Specimens: none Retained items: none EBL: minimal cc Complications: none  Description of procedure: After obtaining informed consent the patient was taken to the operating room and placed supine on operating room table wheregeneral endotracheal anesthesia was initiated, preoperative antibiotics were administered, SCDs applied, and a formal timeout was performed. The abdomen was prepped and draped in usual sterile fashion. Peritoneal access was gained with an infraumbilical Veress needle followed by insufflation to 15 mmHg without incident. A 5 mm trocar and camera were introduced and gross inspection revealed no evidence of injury from our entry. The patient was placed in steep Trendelenburg and the pelvis inspected confirming a large reducible right inguinal hernia in the in direct space in addition to a left complex internal hernia including a direct, indirect and femoral defect. Under direct visualization and after infiltration with local, a right hemiabdomen 5 mm trocar and a left hemiabdomen 12 mm trocar were placed. Beginning on the patient's right side, a peritoneal flap was developed from the anterior superior iliac spine to the medial umbilical ligament using combination of cautery and sharp and blunt dissection. The hernia sac was reduced along with a flap development. The cord structures were visualized and preserved. Once the peritoneal flap was completely developed and the sac  completely reduced, a large 3-D max light Bard mesh was brought into the field and introduced through our 12 mm trocar. This was placed within the preperitoneal space on the right side and directed to lie flat he was noted to sit nicely in the field. This was tacked to the Cooper's ligament as well as superiorly on either side of the inferior epigastric vessels using the secure strap tacker. No tacks were placed laterally or inferiorly. The peritoneal flap was then brought back up to cover the mesh completely and reapproximated to the abdominal wall using the secure strap tacker. We then turned to the left side where in a similar fashion a peritoneal flap was developed using a combination of sharp dissection as well as cautery and blunt dissection from the ASIS to the medial umbilical ligament. The small hernia sacs were easily reduced and the flap developed inferiorly. The transversalis fascia was retracted within the direct hernia defect and tacked onto the Cooper's ligament to prevent seroma formation here. Once the field was developed, another 3-D max light large Bard mesh was brought to the field and directed to lie flat within the preperitoneal space where it sat nicely. Again this was tacked to the Cooper's ligament and superiorly on either side of the inferior epigastric vessels. The peritoneal flap was brought back up to completely cover the mesh and reapproximated abdominal wall with a secure strap tacker. At completion, there was no exposed mesh on either side. Hemostasis was confirmed. The abdomen was once again surveyed to confirm no abdomen allergies or injury from our entry. The 12 mm trocar site in the left hemiabdomen was closed with a figure-of-eight suture of 0 Vicryl in the fascia using a laparoscopic suture passer. Bilateral taps blocks were performed with laparoscopic visualization using Court percent lidocaine with  epinephrine. The abdomen was then desufflated and the remaining 2 trochars  removed. The remaining local was infiltrated in the subcutaneous tissue around each incision and then the skin was closed with running subcuticular Monocryl and Dermabond. The patient was then awakened, extubated and taken to PACU in stable condition.   All counts were correct at the completion of the case.

## 2017-06-16 NOTE — Anesthesia Procedure Notes (Signed)
Procedure Name: Intubation Date/Time: 06/16/2017 1:37 PM Performed by: Valda Favia Pre-anesthesia Checklist: Patient identified, Emergency Drugs available, Suction available, Patient being monitored and Timeout performed Patient Re-evaluated:Patient Re-evaluated prior to inductionOxygen Delivery Method: Circle system utilized Preoxygenation: Pre-oxygenation with 100% oxygen Intubation Type: IV induction Ventilation: Mask ventilation without difficulty Laryngoscope Size: Glidescope and 4 Grade View: Grade II Tube type: Oral Tube size: 7.0 mm Number of attempts: 1 Airway Equipment and Method: Video-laryngoscopy and Stylet Placement Confirmation: ETT inserted through vocal cords under direct vision,  positive ETCO2 and breath sounds checked- equal and bilateral Secured at: 21 cm Tube secured with: Tape Dental Injury: Teeth and Oropharynx as per pre-operative assessment  Difficulty Due To: Difficulty was anticipated, Difficult Airway- due to limited oral opening and Difficult Airway- due to anterior larynx Comments: Elective glidescope utilized due to limited mouth opening and narrow TMD

## 2017-06-16 NOTE — H&P (View-Only) (Signed)
  Justin [name] [place] [name] 06/05/2017 12:24 PM Location: Justin [name] Patient #: [id number] DOB: 1941-03-22 Married / Language: Justin [name] / Race: White Male  History of Present Illness (Justin [name] A. Justin Heller MD; 06/05/2017 12:58 PM) Patient words: He is now off plavix but still on full dose asa. neurologist has OK'd holding aspirin for [name]. Did not end up getting any treatment yet for Multiple myeloma. Saw Dr. Irene [name] in April, whose notes state "He was recommended that if he does intend to proceed with [name] that he should let us know since we shall need to get additional coagulopathy labs to ensure no bleeding diathesis due to paraproteinemia." Looks like he was seen earlier this week as well but notes not in yet.  From December 2017: This very nice 76 year old man who presents with a right inguinal hernia. He noticed it about 3 weeks ago, and has caused him significant pain which fortunately was relieved with the initiation of using a hernia belt. He denies any symptoms of nausea, vomiting, abdominal distention or change in bowel habits. Of note he had an ischemic stroke at the beginning of November, which left him with some residual left lower extremities weakness and he now uses a cane. He wonders if the physical therapy that he did for his stroke may have incurred the hernia to become symptomatic. He has never had any abdominal [name]. He takes full dose aspirin and Plavix for his stroke. 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  The patient is a 76 year old male.   Medication History Justin [name], Justin [name]; 06/05/2017 12:27 PM) Fluorouracil (5% Cream, External) Active. Micardis HCT (80-12.5MG Tablet, Oral) Active. Omeprazole (20MG Capsule DR, Oral)  Active. Allopurinol (300MG Tablet, Oral) Active. Atorvastatin Calcium (40MG Tablet, Oral) Active. Aspirin (81MG Tablet, Oral) Active. MetFORMIN HCl (1000MG Tablet, Oral) Active. Nitrostat (0.4MG Tab Sublingual, Sublingual) Active. Medications Reconciled    Vitals (Justin [name] Justin [name]; 06/05/2017 12:25 PM) 06/05/2017 12:24 PM Weight: 161.5 lb Height: 69in Body Surface Area: 1.89 m Body Mass Index: 23.85 kg/m  Temp.: 97.55F  Pulse: 84 (Regular)  P.OX: 92% (Room air) BP: 122/76 (Sitting, Left Arm, Standard)      Assessment & Plan (Justin [name] A. Justin Heller MD; 06/05/2017 12:59 PM)  BILATERAL INGUINAL HERNIA (K40.20) Story: I again offered him laparoscopic repair. We discussed in depth the technique of this operation, the risks and alternatives. He understands and is agreeable to proceed. We will need to hold the aspirin (can still take baby aspirin) for 1 week before [name]. Will get coags with preop labs. Will send Dr. Irene [name] a note as well.  This visit consisted entirely of counseling and education as well as coordination of care. I spent 30 minutes with Justin [name] in face to face consultation.

## 2017-06-16 NOTE — Interval H&P Note (Signed)
History and Physical Interval Note:  06/16/2017 1:11 PM  Justin [name]  has presented today for surgery, with the diagnosis of BILATERAL INGUINAL HERNIAS  The various methods of treatment have been discussed with the patient and family. After consideration of risks, benefits and other options for treatment, the patient has consented to  Procedure(s): [place] (Bilateral) INSERTION OF MESH (Bilateral) as a surgical intervention .  The patient's history has been reviewed, patient examined, no change in status, stable for surgery.  I have reviewed the patient's chart and labs.  Questions were answered to the patient's satisfaction.     Devian Bartolomei Rich Brave

## 2017-06-16 NOTE — Transfer of Care (Signed)
Immediate Anesthesia Transfer of Care Note  Patient: Justin [name]  Procedure(s) Performed: Procedure(s): LAPAROSCOPIC BILATERAL INGUINAL HERNIA REPAIR (Bilateral) INSERTION OF MESH (Bilateral)  Patient Location: PACU  Anesthesia Type:General  Level of Consciousness: drowsy and responds to stimulation  Airway & Oxygen Therapy: Patient Spontanous Breathing  Post-op Assessment: Report given to RN and Post -op Vital signs reviewed and stable  Post vital signs: Reviewed and stable  Last Vitals:  Vitals:   06/16/17 1137  BP: 117/68  Pulse: 69  Resp: 18  Temp: 36.8 C    Last Pain:  Vitals:   06/16/17 1151  TempSrc:   PainSc: 0-No pain      Patients Stated Pain Goal: 2 (35/36/14 4315)  Complications: No apparent anesthesia complications

## 2017-06-17 ENCOUNTER — Encounter (HOSPITAL_COMMUNITY): Payer: Self-pay | Admitting: Surgery

## 2017-06-24 NOTE — Progress Notes (Signed)
[name] Kitchen    HEMATOLOGY/ONCOLOGY CLINIC NOTE  Date of Service: .06/02/2017  Patient Care Team: Mayra Neer, MD as PCP - General (Family Medicine)  CHIEF COMPLAINTS/PURPOSE OF CONSULTATION:   F/u for Plasma cell neoplasm/Multiple myeloma  HISTORY OF PRESENTING ILLNESS:   Justin [name] is a wonderful 76 y.o. 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  Patient has a history of hypertension, diabetes, GERD, gout, squamous cell skin cancers, elevated PSA, SVT status post ablation in May 2016, chronic back pain who has a history of recurrent CVA/TIA's. He apparently had a left hemispheric TIA June 2017 and has had recurrent) subcortical infarcts thought to be related to small vessel disease. He was noted to have a small PFO that was of questionable significance. He was initially on aspirin and then continued and aspirin + plavix for secondary stroke prevention. He follows with Dr. Antony Contras for his neurology cares.  An SPEP was done due to elevated total proteins of 8.9 and showed an M spike of 2 g/dL. He was also noted to have an elevated total protein of 8.6 in April 2017. He was referred to [place] for further evaluation of his M spike. Blood test did not reveal any hypercalcemia, no significant renal failure. He has been noted to have developed anemia since June the serum and his hemoglobin was 12.6 and is now down to the mid 10 range. He notes no focal bone pains at this time. Overt acute weight loss. No fevers no chills no night sweats.  INTERVAL HISTORY  Justin [name] is here for follow-up of his myeloma. Patient notes no acute new issues. No new bone pains. No new fatigue. He is now on ASA alone for secondary CVA prophylaxis per his neurologist.. Notes no new focal neurological deficits. Hemoglobin and creatinine stable. Total protein minimally elevated compared to previous. No overt evidence of clinical disease progression of his  myeloma at this time. He still continues to be anemic with hemoglobin in the 10-11 range.  He notes his inguinal hernia is still quite bothersome despite using a truss and h is strongly inclined to pursue surgery with Dr Kae Heller prior to having to consider starting treatment for his MM.  MEDICAL HISTORY:  Past Medical History:  Diagnosis Date  . Anemia   . Arthritis   . 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 Diabetes mellitus without complication ([place])   . GERD (gastroesophageal reflux disease)   . Gout   . History of loop recorder   . Hypertension   . Left leg weakness   . Paroxysmal SVT (supraventricular tachycardia) ([place])    a. s/p RFCA on 05/01/15  . Stroke [facility])    TIA and mild stroke    SURGICAL HISTORY: Past Surgical History:  Procedure Laterality Date  . ATRIAL FIBRILLATION ABLATION     Dr. Lovena Le  . BASAL CELL CARCINOMA EXCISION     off of back  . ELECTROPHYSIOLOGIC STUDY N/A 05/01/2015   Procedure: SVT Ablation;  Surgeon: Evans Lance, MD;  Location: [place] [facility];  Service: Cardiovascular;  Laterality: N/A;  . EP IMPLANTABLE DEVICE N/A 06/04/2016   Procedure: Loop Recorder Insertion;  Surgeon: Thompson Grayer, MD;  Location: [place] [facility];  Service: Cardiovascular;  Laterality: N/A;  . INGUINAL HERNIA REPAIR Bilateral 06/16/2017   Procedure: LAPAROSCOPIC BILATERAL INGUINAL HERNIA REPAIR;  Surgeon: Clovis Riley, MD;  Location: [facility] OR;  Service: General;  Laterality: Bilateral;  . INSERTION OF MESH Bilateral 06/16/2017   Procedure: INSERTION OF MESH;  Surgeon: Clovis Riley, MD;  Location: [place];  Service: General;  Laterality: Bilateral;  . SQUAMOUS CELL CARCINOMA EXCISION    . TEE WITHOUT CARDIOVERSION N/A 06/04/2016   Procedure: TRANSESOPHAGEAL ECHOCARDIOGRAM (TEE);  Surgeon: Pixie Casino, MD;  Location: [facility];  Service: Cardiovascular;  Laterality: N/A;    SOCIAL HISTORY: Social History   Social History  .  Marital status: Married    Spouse name: N/A  . Number of children: N/A  . Years of education: N/A   Occupational History  . Not on file.   Social History Main Topics  . Smoking status: Never Smoker  . Smokeless tobacco: Never Used  . Alcohol use No  . Drug use: No  . Sexual activity: Not on file   Other Topics Concern  . Not on file   Social History Narrative  . No narrative on file    FAMILY HISTORY: Family History  Problem Relation Age of Onset  . Stroke Mother   . Hypertension Mother   . Alzheimer's disease Father   . Heart failure Father   . Down syndrome Daughter     ALLERGIES:  is allergic to no known allergies.  MEDICATIONS:  Current Outpatient Prescriptions  Medication Sig Dispense Refill  . allopurinol (ZYLOPRIM) 300 MG tablet Take 300 mg by mouth daily.     [name] Kitchen aspirin EC 81 MG tablet Take 81 mg by mouth daily.    [name] Kitchen atorvastatin (LIPITOR) 40 MG tablet Take 1 tablet (40 mg total) by mouth every morning. 90 tablet 3  . docusate sodium (COLACE) 100 MG capsule Take 1 capsule (100 mg total) by mouth 2 (two) times daily. 60 capsule 0  . fluorouracil (EFUDEX) 5 % cream Apply 1 application topically daily as needed (for skin cancer prevention).   0  . glucose monitoring kit (FREESTYLE) monitoring kit 1 each by Does not apply route as needed for other.    . metFORMIN (GLUCOPHAGE) 1000 MG tablet Take 1,000 mg by mouth daily with breakfast.    . nitroGLYCERIN (NITROSTAT) 0.4 MG SL tablet Place 1 tablet (0.4 mg total) under the tongue every 5 (five) minutes as needed for chest pain. 25 tablet 3  . omeprazole (PRILOSEC) 20 MG capsule Take 20 mg by mouth every morning.     [name] Kitchen telmisartan-hydrochlorothiazide (MICARDIS HCT) 80-12.5 MG tablet Take 1 tablet by mouth daily. 90 tablet 3  . traMADol (ULTRAM) 50 MG tablet Take 1 tablet (50 mg total) by mouth every 6 (six) hours as needed. 20 tablet 0  . vitamin B-12 (CYANOCOBALAMIN) 1000 MCG tablet Take 1,000 mcg by mouth daily.      No current facility-administered medications for this visit.     REVIEW OF SYSTEMS:    10 Point review of Systems was done is negative except as noted above.  PHYSICAL EXAMINATION: ECOG PERFORMANCE STATUS: 2 - Symptomatic, <50% confined to bed  . Vitals:   06/02/17 1016  BP: 123/74  Pulse: 73  Resp: 18   Filed Weights   06/02/17 1016  Weight: 160 lb 14.4 oz (73 kg)   .Body mass index is 23.76 kg/m.  GENERAL:alert, in no acute distress and comfortable SKIN: skin color, texture, turgor are normal, no rashes or significant lesions EYES: normal, conjunctiva are pink and non-injected, sclera clear OROPHARYNX:no exudate, no erythema and lips, buccal mucosa, and tongue normal  NECK: supple, no JVD, thyroid normal size, non-tender, without nodularity LYMPH:  no palpable lymphadenopathy in the cervical, axillary or inguinal LUNGS: clear to auscultation with normal respiratory effort HEART: regular rate & rhythm,  no murmurs and no lower extremity edema ABDOMEN: abdomen soft, non-tender, normoactive bowel sounds  Musculoskeletal: no cyanosis of digits and no clubbing  PSYCH: alert & oriented x 3 with fluent speech NEURO: no focal motor/sensory deficits  LABORATORY DATA:  I have reviewed the data as listed . CBC Latest Ref Rng & Units 06/02/2017 04/03/2017  WBC 4.0 - 10.5 K/uL 5.6 3.7(L)  Hemoglobin 13.0 - 17.0 g/dL 10.1(L) 10.6(L)  Hematocrit 39.0 - 52.0 % 31.5(L) 33.0(L)  Platelets 150 - 400 K/uL 314 274   .   CMP Latest Ref Rng & Units 06/16/2017 06/02/2017 06/02/2017  Glucose 65 - 99 mg/dL 117(H) 111 -  BUN 6 - 20 mg/dL 22(H) 26.5(H) -  Creatinine 0.61 - 1.24 mg/dL 1.14 1.2 -  Sodium 135 - 145 mmol/L 136 138 -  Potassium 3.5 - 5.1 mmol/L 3.9 4.2 -  Chloride 101 - 111 mmol/L 102 - -  CO2 22 - 32 mmol/L 26 27 -  Calcium 8.9 - 10.3 mg/dL 9.3 9.8 -  Total Protein 6.5 - 8.1 g/dL 9.2(H) 9.4(H) 8.7(H)  Total Bilirubin 0.3 - 1.2 mg/dL 0.6 0.49 -  Alkaline Phos 38 - 126 U/L  58 63 -  AST 15 - 41 U/L 22 14 -  ALT 17 - 63 U/L 8(L) <6 -   Component     Latest Ref Rng & Units 06/02/2017  IgG (Immunoglobin G), Serum     700 - 1600 mg/dL 2,877 (H)  IgA/Immunoglobulin A, Serum     61 - 437 mg/dL 75  IgM, Qn, Serum     15 - 143 mg/dL 35  Total Protein     6.0 - 8.5 g/dL 8.7 (H)  Albumin SerPl Elph-Mcnc     2.9 - 4.4 g/dL 3.7  Alpha 1     0.0 - 0.4 g/dL 0.4  Alpha2 Glob SerPl Elph-Mcnc     0.4 - 1.0 g/dL 1.1 (H)  B-Globulin SerPl Elph-Mcnc     0.7 - 1.3 g/dL 1.0  Gamma Glob SerPl Elph-Mcnc     0.4 - 1.8 g/dL 2.6 (H)  M Protein SerPl Elph-Mcnc     Not Observed g/dL 2.3 (H)  Globulin, Total     2.2 - 3.9 g/dL 5.0 (H)  Albumin/Glob SerPl     0.7 - 1.7 0.8  IFE 1      Comment  Please Note (HCV):      Comment  Iron     42 - 163 ug/dL 45  TIBC     202 - 409 ug/dL 233  UIBC     117 - 376 ug/dL 187  %SAT     20 - 55 % 19 (L)  Ig Kappa Free Light Chain     3.3 - 19.4 mg/L 21.4 (H)  Ig Lambda Free Light Chain     5.7 - 26.3 mg/L 325.0 (H)  Kappa/Lambda FluidC Ratio     0.26 - 1.65 0.07 (L)  INR     0.8 - 1.2 1.0  Prothrombin Time     9.1 - 12.0 sec 11.0  Ferritin     22 - 316 ng/ml 791 (H)  Vitamin B12     232 - 1,245 pg/mL 299  THROMBIN TIME     0.0 - 23.0 sec 16.8  APTT     24 - 33 sec 30            RADIOGRAPHIC STUDIES: I have personally reviewed the radiological images as listed and agreed with the findings in the report.  DG Bone Survey Met (Accession [id number]) (Order [id number])  Imaging  Date: 11/28/2016 Department: [facility] Released By: Pricilla Riffle Authorizing: Brunetta Genera, MD  Exam Information   Status Exam Begun  Exam Ended   Final [99] 11/28/2016 11:59 AM 11/28/2016 12:36 PM  PACS Images   Show images for DG Bone Survey Met  Study Result   CLINICAL DATA:  Monoclonal gammopathy of unknown significance. History of squamous cell carcinoma excision, basal cell  carcinoma excision.  EXAM: METASTATIC BONE SURVEY  COMPARISON:  CT neck, chest, abdomen and pelvis from 10/29/16, MRI of the head from 06/01/2016, CXR 10/27/2016, lumbar spine radiographs 06/24/2016  FINDINGS: Lateral skull: Small occipital lucency which may represent a normal arachnoid granulation posteriorly in the occiput. No definite lytic abnormality.  Cervical spine AP and lateral: Disc space narrowing at C2-3, and from C4 through C7. C4-5, C5-6 and C6-7 uncovertebral joint spurring bilaterally. No lytic abnormality.  Thoracic spine AP and lateral: T8-9 right-sided osteophytes and to a lesser degree T9-10. No lytic abnormality. Slight multilevel lumbar thoracic disc space narrowing likely degenerative.  Lumbar spine AP and lateral: Disc space narrowing at L4-5 and to a greater extent L5-S1. No lytic disease. L3 through S1 facet sclerosis.  AP pelvis: Negative for lytic disease.  Bilateral upper and lower extremities shoulders through wrist and from the hips through ankle: Negative for lytic disease. Joint space narrowing of the femorotibial compartments both knees. Subchondral cyst of the right patella.  CXR: Clear lungs. Cardiac implantable monitoring device projects over the left heart. Aortic atherosclerosis. No lytic disease.  IMPRESSION: No findings suspicious for lytic disease.   Electronically Signed   By: Ashley Royalty M.D.   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 CYtogenetics and Myeloma FISH panel.- trisomy 11 Patient has normocytic anemia without any other clear etiology. (>2g/dl lower that lower limit of normal which is 13) which per criteria would place this in the Multiple myeloma category as opposed to Smoldering Multiple myeloma (Borderline criterion) No overt renal failure or hypercalcemia at  this time No focal bone pains though he has significant chronic back pain related to degenerative disc disease. Bone survey shows no overt lytic lesions. Myeloma panel M spike increasing gradually and is up to 2.3g/dl with increasing Lambda FLC  2) Borderline low B12 levels 299 --- on replacement PLAN -Discussed available lab results from today -we discussed that with his Anemia and hgb down to 10 he certainly meets criteria to start treating him for MM. -he strongly prefers to have his bothersome inguinal hernia operated on prior to treatment considerations which is not unreasonable.  -pre-op coags including PT, APTT and TT were check and do not suggest a paraproteinemia related bleeding - this was communicated to Dr Kae Heller. [name] Kitchencontnue B12 1042mg po daily -we shall see him back 5-6 weeks to reassess his myeloma status post-op and plan treatment for his MM once healed from surgery.  2) recurrent CVA - thought to be related to small vessel disease as per neurology. Has a small PFO which could be an additional risk  factor. -on ASA  as per neurology (Dr Leonie Man)  3) Patient Active Problem List   Diagnosis Date Noted  . History of loop recorder 06/12/2017  . Cryptogenic stroke ([place]) 10/28/2016  . Gait abnormality   . History of CVA (cerebrovascular accident)   . History of TIA (transient ischemic attack)   . Benign essential HTN   . Paroxysmal SVT (supraventricular tachycardia) ([place])   . Acute blood loss anemia   . Acute ischemic stroke ([place])   . CVA (cerebral vascular accident) ([place]) 10/26/2016  . History of recent stroke 06/06/2016  . 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 TIA (transient ischemic attack) 06/02/2016  . Numbness 06/01/2016  . Right arm numbness 06/01/2016  . Atherosclerosis of native coronary artery of native heart without angina pectoris 03/20/2016  . Hypertension   . GERD (gastroesophageal reflux disease)   . Gout   . S/P RF ablation operation for arrhythmia  12/13/2014  . Hyperlipidemia 12/13/2014  . Controlled type 2 diabetes mellitus with complication, without long-term current use of insulin ([place]) 12/07/2014   PLAN -Continue f/u with PCP   All of the patients questions were answered with apparent satisfaction. The patient knows to call the clinic with any problems, questions or concerns.  I spent 20 minutes counseling the patient face to face. The total time spent in the appointment was 30 minutes and more than 50% was on counseling and direct patient cares.    Sullivan Lone MD [place] AAHIVMS [facility] Hematology/Oncology Physician [facility]  (Office):       [phone] (Work cell):  [phone] (Fax):           [phone]

## 2017-06-30 ENCOUNTER — Ambulatory Visit (INDEPENDENT_AMBULATORY_CARE_PROVIDER_SITE_OTHER): Payer: Medicare Other | Admitting: *Deleted

## 2017-06-30 DIAGNOSIS — I639 Cerebral infarction, unspecified: Secondary | ICD-10-CM

## 2017-06-30 NOTE — Progress Notes (Signed)
Carelink Summary Report / Loop Recorder 

## 2017-07-08 ENCOUNTER — Telehealth: Payer: Self-pay | Admitting: Hematology

## 2017-07-08 ENCOUNTER — Other Ambulatory Visit (HOSPITAL_BASED_OUTPATIENT_CLINIC_OR_DEPARTMENT_OTHER): Payer: Medicare Other

## 2017-07-08 ENCOUNTER — Encounter: Payer: Self-pay | Admitting: Hematology

## 2017-07-08 ENCOUNTER — Ambulatory Visit (HOSPITAL_BASED_OUTPATIENT_CLINIC_OR_DEPARTMENT_OTHER): Payer: Medicare Other | Admitting: Hematology

## 2017-07-08 VITALS — BP 123/73 | HR 74 | Temp 97.8°F | Resp 17 | Ht 69.0 in | Wt 152.6 lb

## 2017-07-08 DIAGNOSIS — C9000 Multiple myeloma not having achieved remission: Secondary | ICD-10-CM

## 2017-07-08 DIAGNOSIS — E538 Deficiency of other specified B group vitamins: Secondary | ICD-10-CM | POA: Diagnosis not present

## 2017-07-08 DIAGNOSIS — D649 Anemia, unspecified: Secondary | ICD-10-CM

## 2017-07-08 DIAGNOSIS — Z8673 Personal history of transient ischemic attack (TIA), and cerebral infarction without residual deficits: Secondary | ICD-10-CM

## 2017-07-08 LAB — CBC & DIFF AND RETIC
BASO%: 0.3 % (ref 0.0–2.0)
BASOS ABS: 0 10*3/uL (ref 0.0–0.1)
EOS ABS: 0.1 10*3/uL (ref 0.0–0.5)
EOS%: 2.4 % (ref 0.0–7.0)
HCT: 33.2 % — ABNORMAL LOW (ref 38.4–49.9)
HEMOGLOBIN: 10.4 g/dL — AB (ref 13.0–17.1)
IMMATURE RETIC FRACT: 8.8 % (ref 3.00–10.60)
LYMPH%: 35.4 % (ref 14.0–49.0)
MCH: 28.2 pg (ref 27.2–33.4)
MCHC: 31.3 g/dL — ABNORMAL LOW (ref 32.0–36.0)
MCV: 90 fL (ref 79.3–98.0)
MONO#: 0.2 10*3/uL (ref 0.1–0.9)
MONO%: 6.3 % (ref 0.0–14.0)
NEUT#: 2.1 10*3/uL (ref 1.5–6.5)
NEUT%: 55.6 % (ref 39.0–75.0)
PLATELETS: 334 10*3/uL (ref 140–400)
RBC: 3.69 10*6/uL — AB (ref 4.20–5.82)
RDW: 17.8 % — ABNORMAL HIGH (ref 11.0–14.6)
RETIC CT ABS: 24.35 10*3/uL — AB (ref 34.80–93.90)
Retic %: 0.66 % — ABNORMAL LOW (ref 0.80–1.80)
WBC: 3.8 10*3/uL — ABNORMAL LOW (ref 4.0–10.3)
lymph#: 1.3 10*3/uL (ref 0.9–3.3)
nRBC: 0 % (ref 0–0)

## 2017-07-08 LAB — COMPREHENSIVE METABOLIC PANEL
ALBUMIN: 3.5 g/dL (ref 3.5–5.0)
ALK PHOS: 69 U/L (ref 40–150)
ALT: 8 U/L (ref 0–55)
ANION GAP: 11 meq/L (ref 3–11)
AST: 18 U/L (ref 5–34)
BILIRUBIN TOTAL: 0.34 mg/dL (ref 0.20–1.20)
BUN: 27.5 mg/dL — ABNORMAL HIGH (ref 7.0–26.0)
CO2: 27 meq/L (ref 22–29)
CREATININE: 1.2 mg/dL (ref 0.7–1.3)
Calcium: 10.1 mg/dL (ref 8.4–10.4)
Chloride: 100 mEq/L (ref 98–109)
EGFR: 56 mL/min/{1.73_m2} — AB (ref >90)
GLUCOSE: 121 mg/dL (ref 70–140)
Potassium: 4.2 mEq/L (ref 3.5–5.1)
SODIUM: 138 meq/L (ref 136–145)
TOTAL PROTEIN: 9.4 g/dL — AB (ref 6.4–8.3)

## 2017-07-08 NOTE — Patient Instructions (Signed)
Thank you for choosing [facility] to provide your oncology and hematology care.  To afford each patient quality time with our providers, please arrive 30 minutes before your scheduled appointment time.  If you arrive late for your appointment, you may be asked to reschedule.  We strive to give you quality time with our providers, and arriving late affects you and other patients whose appointments are after yours.   If you are a no show for multiple scheduled visits, you may be dismissed from the clinic at the providers discretion.    Again, thank you for choosing [facility], our hope is that these requests will decrease the amount of time that you wait before being seen by our physicians.  ______________________________________________________________________  Should you have questions after your visit to the [facility], please contact our office at [phone] between the hours of 8:30 and 4:30 p.m.    Voicemails left after 4:30p.m will not be returned until the following business day.    For prescription refill requests, please have your pharmacy contact us directly.  Please also try to allow 48 hours for prescription requests.    Please contact the scheduling department for questions regarding scheduling.  For scheduling of procedures such as PET scans, CT scans, MRI, Ultrasound, etc please contact central scheduling at [phone].    Resources For Cancer Patients and Caregivers:   Oncolink.org:  A wonderful resource for patients and healthcare providers for information regarding your disease, ways to tract your treatment, what to expect, etc.     American Cancer Society:  800-227-2345  Can help patients locate various types of support and financial assistance  Cancer Care: 1-800-813-HOPE (4673) Provides financial assistance, online support groups, medication/co-pay assistance.    [place] DSS:  336-641-3447 Where to apply for food  stamps, Medicaid, and utility assistance  Medicare Rights Center: 800-333-4114 Helps people with Medicare understand their rights and benefits, navigate the Medicare system, and secure the quality healthcare they deserve  SCAT: 336-333-6589 [place] [employer]'s shared-ride transportation service for eligible riders who have a disability that prevents them from riding the fixed route bus.    For additional information on assistance programs please contact our social worker:   Grier Hock/Abigail Elmore:  [phone]            

## 2017-07-08 NOTE — Telephone Encounter (Signed)
Scheduled appt per 7/17 los - gave patient AVS and calender per los. - lab and f/u in about 8 weeks.

## 2017-07-09 LAB — CUP PACEART REMOTE DEVICE CHECK
MDC IDC PG IMPLANT DT: 20170613
MDC IDC SESS DTM: 20180709061139

## 2017-07-10 NOTE — Progress Notes (Signed)
[name] Kitchen    HEMATOLOGY/ONCOLOGY CLINIC NOTE  Date of Service: .07/08/2017  Patient Care Team: Justin Neer, MD as PCP - General (Family Medicine)  CHIEF COMPLAINTS/PURPOSE OF CONSULTATION:   F/u for Plasma cell neoplasm/Multiple myeloma  HISTORY OF PRESENTING ILLNESS:   Justin [name] is a wonderful 76 y.o. 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  Patient has a history of hypertension, diabetes, GERD, gout, squamous cell skin cancers, elevated PSA, SVT status post ablation in May 2016, chronic back pain who has a history of recurrent CVA/TIA's. He apparently had a left hemispheric TIA June 2017 and has had recurrent) subcortical infarcts thought to be related to small vessel disease. He was noted to have a small PFO that was of questionable significance. He was initially on aspirin and then continued and aspirin + plavix for secondary stroke prevention. He follows with Dr. Antony [name] for his neurology cares.  An SPEP was done due to elevated total proteins of 8.9 and showed an M spike of 2 g/dL. He was also noted to have an elevated total protein of 8.6 in April 2017. He was referred to [place] for further evaluation of his M spike. Blood test did not reveal any hypercalcemia, no significant renal failure. He has been noted to have developed anemia since June the serum and his hemoglobin was 12.6 and is now down to the mid 10 range. He notes no focal bone pains at this time. Overt acute weight loss. No fevers no chills no night sweats.  INTERVAL HISTORY  [name] is here for follow-up of his myeloma. Patient had his bilateral inguinal hernia repair surgery on 06/16/2017 and is healing well from this. Notes no acute new symptoms. Notes that he had a recent medication which has been quite nice. He also notes that he intends to travel to [facility] for a while. No new bone pains. Hemoglobin is relatively stable with a mild drop  likely due to some blood loss with surgery. No other acute new symptoms. Prefers to delay consideration of treatment for at least a couple of months.  MEDICAL HISTORY:  Past Medical History:  Diagnosis Date  . Anemia   . Arthritis   . 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 Diabetes mellitus without complication ([place])   . GERD (gastroesophageal reflux disease)   . Gout   . History of loop recorder   . Hypertension   . Left leg weakness   . Paroxysmal SVT (supraventricular tachycardia) ([place])    a. s/p RFCA on 05/01/15  . Stroke [facility])    TIA and mild stroke    SURGICAL HISTORY: Past Surgical History:  Procedure Laterality Date  . ATRIAL FIBRILLATION ABLATION     Dr. Lovena Le  . BASAL CELL CARCINOMA EXCISION     off of back  . ELECTROPHYSIOLOGIC STUDY N/A 05/01/2015   Procedure: SVT Ablation;  Surgeon: Evans Lance, MD;  Location: [place] CV LAB;  Service: Cardiovascular;  Laterality: N/A;  . EP IMPLANTABLE DEVICE N/A 06/04/2016   Procedure: Loop Recorder Insertion;  Surgeon: Thompson Grayer, MD;  Location: [place] CV LAB;  Service: Cardiovascular;  Laterality: N/A;  . INGUINAL HERNIA REPAIR Bilateral 06/16/2017   Procedure: LAPAROSCOPIC BILATERAL INGUINAL HERNIA REPAIR;  Surgeon: Clovis Riley, MD;  Location: [place];  Service: General;  Laterality: Bilateral;  . INSERTION OF MESH Bilateral 06/16/2017  Procedure: INSERTION OF MESH;  Surgeon: Clovis Riley, MD;  Location: [place];  Service: General;  Laterality: Bilateral;  . SQUAMOUS CELL CARCINOMA EXCISION    . TEE WITHOUT CARDIOVERSION N/A 06/04/2016   Procedure: TRANSESOPHAGEAL ECHOCARDIOGRAM (TEE);  Surgeon: Pixie Casino, MD;  Location: [facility] ENDOSCOPY;  Service: Cardiovascular;  Laterality: N/A;    SOCIAL HISTORY: Social History   Social History  . Marital status: Married    Spouse name: N/A  . Number of children: N/A  . Years of education: N/A   Occupational History  . Not on  file.   Social History Main Topics  . Smoking status: Never Smoker  . Smokeless tobacco: Never Used  . Alcohol use No  . Drug use: No  . Sexual activity: Not on file   Other Topics Concern  . Not on file   Social History Narrative  . No narrative on file    FAMILY HISTORY: Family History  Problem Relation Age of Onset  . Stroke Mother   . Hypertension Mother   . Alzheimer's disease Father   . Heart failure Father   . Down syndrome Daughter     ALLERGIES:  is allergic to no known allergies.  MEDICATIONS:  Current Outpatient Prescriptions  Medication Sig Dispense Refill  . allopurinol (ZYLOPRIM) 300 MG tablet Take 300 mg by mouth daily.     [name] Kitchen aspirin EC 81 MG tablet Take 81 mg by mouth daily.    [name] Kitchen atorvastatin (LIPITOR) 40 MG tablet Take 1 tablet (40 mg total) by mouth every morning. 90 tablet 3  . docusate sodium (COLACE) 100 MG capsule Take 1 capsule (100 mg total) by mouth 2 (two) times daily. 60 capsule 0  . fluorouracil (EFUDEX) 5 % cream Apply 1 application topically daily as needed (for skin cancer prevention).   0  . glucose monitoring kit (FREESTYLE) monitoring kit 1 each by Does not apply route as needed for other.    . metFORMIN (GLUCOPHAGE) 1000 MG tablet Take 1,000 mg by mouth daily with breakfast.    . nitroGLYCERIN (NITROSTAT) 0.4 MG SL tablet Place 1 tablet (0.4 mg total) under the tongue every 5 (five) minutes as needed for chest pain. 25 tablet 3  . omeprazole (PRILOSEC) 20 MG capsule Take 20 mg by mouth every morning.     [name] Kitchen telmisartan-hydrochlorothiazide (MICARDIS HCT) 80-12.5 MG tablet Take 1 tablet by mouth daily. 90 tablet 3  . traMADol (ULTRAM) 50 MG tablet Take 1 tablet (50 mg total) by mouth every 6 (six) hours as needed. 20 tablet 0  . vitamin B-12 (CYANOCOBALAMIN) 1000 MCG tablet Take 1,000 mcg by mouth daily.     No current facility-administered medications for this visit.     REVIEW OF SYSTEMS:    10 Point review of Systems was done is  negative except as noted above.  PHYSICAL EXAMINATION: ECOG PERFORMANCE STATUS: 2 - Symptomatic, <50% confined to bed  . Vitals:   07/08/17 0923  BP: 123/73  Pulse: 74  Resp: 17  Temp: 97.8 F (36.6 C)   Filed Weights   07/08/17 0923  Weight: 152 lb 9.6 oz (69.2 kg)   .Body mass index is 22.54 kg/m.  GENERAL:alert, in no acute distress and comfortable SKIN: skin color, texture, turgor are normal, no rashes or significant lesions EYES: normal, conjunctiva are pink and non-injected, sclera clear OROPHARYNX:no exudate, no erythema and lips, buccal mucosa, and tongue normal  NECK: supple, no JVD, thyroid normal size, non-tender, without nodularity LYMPH:  no palpable lymphadenopathy in the cervical, axillary or inguinal LUNGS: clear to auscultation with normal respiratory effort HEART: regular rate & rhythm,  no murmurs and no lower extremity edema ABDOMEN: abdomen soft, non-tender, normoactive bowel sounds  Musculoskeletal: no cyanosis of digits and no clubbing  PSYCH: alert & oriented x 3 with fluent speech NEURO: no focal motor/sensory deficits  LABORATORY DATA:  I have reviewed the data as listed. CBC Latest Ref Rng & Units 07/08/2017 06/16/2017 06/02/2017  WBC 4.0 - 10.3 10e3/uL 3.8(L) 4.3 5.6  Hemoglobin 13.0 - 17.1 g/dL 10.4(L) 11.0(L) 10.1(L)  Hematocrit 38.4 - 49.9 % 33.2(L) 34.1(L) 31.5(L)  Platelets 140 - 400 10e3/uL 334 200 314    CMP Latest Ref Rng & Units 07/08/2017 06/16/2017 06/02/2017  Glucose 70 - 140 mg/dl 121 117(H) 111  BUN 7.0 - 26.0 mg/dL 27.5(H) 22(H) 26.5(H)  Creatinine 0.7 - 1.3 mg/dL 1.2 1.14 1.2  Sodium 136 - 145 mEq/L 138 136 138  Potassium 3.5 - 5.1 mEq/L 4.2 3.9 4.2  Chloride 101 - 111 mmol/L - 102 -  CO2 22 - 29 mEq/L _0 Calcium 8.4 - 10.4 mg/dL 10.1 9.3 9.8  Total Protein 6.4 - 8.3 g/dL 9.4(H) 9.2(H) 9.4(H)  Total Bilirubin 0.20 - 1.20 mg/dL 0.34 0.6 0.49  Alkaline Phos 40 - 150 U/L 69 58 63  AST 5 - 34 U/L _1 ALT 0 - 55 U/L  8 8(L) <6   Component     Latest Ref Rng & Units 06/02/2017  IgG (Immunoglobin G), Serum     700 - 1600 mg/dL 2,877 (H)  IgA/Immunoglobulin A, Serum     61 - 437 mg/dL 75  IgM, Qn, Serum     15 - 143 mg/dL 35  Total Protein     6.0 - 8.5 g/dL 8.7 (H)  Albumin SerPl Elph-Mcnc     2.9 - 4.4 g/dL 3.7  Alpha 1     0.0 - 0.4 g/dL 0.4  Alpha2 Glob SerPl Elph-Mcnc     0.4 - 1.0 g/dL 1.1 (H)  B-Globulin SerPl Elph-Mcnc     0.7 - 1.3 g/dL 1.0  Gamma Glob SerPl Elph-Mcnc     0.4 - 1.8 g/dL 2.6 (H)  M Protein SerPl Elph-Mcnc     Not Observed g/dL 2.3 (H)  Globulin, Total     2.2 - 3.9 g/dL 5.0 (H)  Albumin/Glob SerPl     0.7 - 1.7 0.8  IFE 1      Comment  Please Note (HCV):      Comment  Iron     42 - 163 ug/dL 45  TIBC     202 - 409 ug/dL 233  UIBC     117 - 376 ug/dL 187  %SAT     20 - 55 % 19 (L)  Ig Kappa Free Light Chain     3.3 - 19.4 mg/L 21.4 (H)  Ig Lambda Free Light Chain     5.7 - 26.3 mg/L 325.0 (H)  Kappa/Lambda FluidC Ratio     0.26 - 1.65 0.07 (L)  INR     0.8 - 1.2 1.0  Prothrombin Time     9.1 - 12.0 sec 11.0  Ferritin     22 - 316 ng/ml 791 (H)  Vitamin B12     232 - 1,245 pg/mL 299  THROMBIN TIME     0.0 - 23.0 sec 16.8  APTT     24 - 33 sec  30            RADIOGRAPHIC STUDIES: I have personally reviewed the radiological images as listed and agreed with the findings in the report.  DG Bone Survey Met (Accession [id number]) (Order [id number])  Imaging  Date: 11/28/2016 Department: [facility] Released By: Pricilla Riffle Authorizing: Brunetta Genera, MD  Exam Information   Status Exam Begun  Exam Ended   Final [99] 11/28/2016 11:59 AM 11/28/2016 12:36 PM  PACS Images   Show images for DG Bone Survey Met  Study Result   CLINICAL DATA:  Monoclonal gammopathy of unknown significance. History of squamous cell carcinoma excision, basal cell carcinoma excision.  EXAM: METASTATIC BONE  SURVEY  COMPARISON:  CT neck, chest, abdomen and pelvis from 10/29/16, MRI of the head from 06/01/2016, CXR 10/27/2016, lumbar spine radiographs 06/24/2016  FINDINGS: Lateral skull: Small occipital lucency which may represent a normal arachnoid granulation posteriorly in the occiput. No definite lytic abnormality.  Cervical spine AP and lateral: Disc space narrowing at C2-3, and from C4 through C7. C4-5, C5-6 and C6-7 uncovertebral joint spurring bilaterally. No lytic abnormality.  Thoracic spine AP and lateral: T8-9 right-sided osteophytes and to a lesser degree T9-10. No lytic abnormality. Slight multilevel lumbar thoracic disc space narrowing likely degenerative.  Lumbar spine AP and lateral: Disc space narrowing at L4-5 and to a greater extent L5-S1. No lytic disease. L3 through S1 facet sclerosis.  AP pelvis: Negative for lytic disease.  Bilateral upper and lower extremities shoulders through wrist and from the hips through ankle: Negative for lytic disease. Joint space narrowing of the femorotibial compartments both knees. Subchondral cyst of the right patella.  CXR: Clear lungs. Cardiac implantable monitoring device projects over the left heart. Aortic atherosclerosis. No lytic disease.  IMPRESSION: No findings suspicious for lytic disease.   Electronically Signed   By: Ashley Royalty M.D.   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 CYtogenetics and Myeloma FISH panel.- trisomy 11 Patient has normocytic anemia without any other clear etiology. (>2g/dl lower that lower limit of normal which is 13) which per criteria would place this in the Multiple myeloma category as opposed to Smoldering Multiple myeloma (Borderline criterion) No overt renal failure or hypercalcemia at this time No focal bone pains though he has  significant chronic back pain related to degenerative disc disease. Bone survey shows no overt lytic lesions. Myeloma panel M spike increasing gradually and is up to 2.3g/dl with increasing Lambda FLC  2) Borderline low B12 levels 299 --- on replacement PLAN -Discussed available lab results from today.Hemoglobin creatinine and calcium levels within normal limits. No new bone pains. -Patient is about 3 weeks out from his bilateral inguinal hernia surgeries on his healing well. No issues with excessive bleeding. -We shall follow up on his Myeloma labs showing today and B12 levels. -He has additional travels planned and would like to delay any treatment consideration for at least 2 months. -contnue B12 1047mg po daily -RTC with Dr KIrene Limboin 8 weeks Labs 1 week before clinic visit  2) recurrent CVA - thought to be related to small vessel disease as per neurology. Has a small PFO which could be an additional risk factor. -on ASA  as per neurology (Dr SLeonie Man  3) Patient Active Problem List   Diagnosis Date Noted  .  History of loop recorder 06/12/2017  . Cryptogenic stroke ([place]) 10/28/2016  . Gait abnormality   . History of CVA (cerebrovascular accident)   . History of TIA (transient ischemic attack)   . Benign essential HTN   . Paroxysmal SVT (supraventricular tachycardia) ([place])   . Acute blood loss anemia   . Acute ischemic stroke ([place])   . CVA (cerebral vascular accident) ([place]) 10/26/2016  . History of recent stroke 06/06/2016  . 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 TIA (transient ischemic attack) 06/02/2016  . Numbness 06/01/2016  . Right arm numbness 06/01/2016  . Atherosclerosis of native coronary artery of native heart without angina pectoris 03/20/2016  . Hypertension   . GERD (gastroesophageal reflux disease)   . Gout   . S/P RF ablation operation for arrhythmia 12/13/2014  . Hyperlipidemia 12/13/2014  . Controlled type 2 diabetes mellitus with complication, without  long-term current use of insulin ([place]) 12/07/2014   PLAN -Continue f/u with PCP  RTC with Dr Irene Limbo in 8 weeks Labs 1 week before clinic visit  All of the patients questions were answered with apparent satisfaction. The patient knows to call the clinic with any problems, questions or concerns.  I spent 20 minutes counseling the patient face to face. The total time spent in the appointment was 25 minutes and more than 50% was on counseling and direct patient cares.    Sullivan Lone MD [place] AAHIVMS [facility] Hematology/Oncology [facility]  (Office):       [phone] (Work cell):  [phone] (Fax):           [phone]

## 2017-07-16 ENCOUNTER — Telehealth: Payer: Self-pay | Admitting: Cardiology

## 2017-07-16 NOTE — Telephone Encounter (Signed)
Spoke w/ pt and requested that he send a manual transmission b/c his home monitor has not updated in at least 14 days.   

## 2017-07-30 ENCOUNTER — Ambulatory Visit (INDEPENDENT_AMBULATORY_CARE_PROVIDER_SITE_OTHER): Payer: Medicare Other | Admitting: *Deleted

## 2017-07-30 DIAGNOSIS — I639 Cerebral infarction, unspecified: Secondary | ICD-10-CM | POA: Diagnosis not present

## 2017-07-30 NOTE — Progress Notes (Signed)
Carelink Summary Report / Loop Recorder 

## 2017-08-07 LAB — CUP PACEART REMOTE DEVICE CHECK
MDC IDC PG IMPLANT DT: 20170613
MDC IDC SESS DTM: 20180808063819

## 2017-08-26 ENCOUNTER — Other Ambulatory Visit (HOSPITAL_BASED_OUTPATIENT_CLINIC_OR_DEPARTMENT_OTHER): Payer: Medicare Other

## 2017-08-26 DIAGNOSIS — C9000 Multiple myeloma not having achieved remission: Secondary | ICD-10-CM | POA: Diagnosis not present

## 2017-08-26 DIAGNOSIS — E538 Deficiency of other specified B group vitamins: Secondary | ICD-10-CM

## 2017-08-26 LAB — CBC & DIFF AND RETIC
BASO%: 0.5 % (ref 0.0–2.0)
BASOS ABS: 0 10*3/uL (ref 0.0–0.1)
EOS ABS: 0.1 10*3/uL (ref 0.0–0.5)
EOS%: 2.6 % (ref 0.0–7.0)
HEMATOCRIT: 29.4 % — AB (ref 38.4–49.9)
HEMOGLOBIN: 10 g/dL — AB (ref 13.0–17.1)
IMMATURE RETIC FRACT: 7 % (ref 3.00–10.60)
LYMPH%: 24.1 % (ref 14.0–49.0)
MCH: 30.8 pg (ref 27.2–33.4)
MCHC: 34.1 g/dL (ref 32.0–36.0)
MCV: 90.2 fL (ref 79.3–98.0)
MONO#: 0.2 10*3/uL (ref 0.1–0.9)
MONO%: 6.3 % (ref 0.0–14.0)
NEUT#: 2 10*3/uL (ref 1.5–6.5)
NEUT%: 66.5 % (ref 39.0–75.0)
PLATELETS: 128 10*3/uL — AB (ref 140–400)
RBC: 3.26 10*6/uL — ABNORMAL LOW (ref 4.20–5.82)
RDW: 19.7 % — ABNORMAL HIGH (ref 11.0–14.6)
Retic %: 0.47 % — ABNORMAL LOW (ref 0.80–1.80)
Retic Ct Abs: 15.32 10*3/uL — ABNORMAL LOW (ref 34.80–93.90)
WBC: 3 10*3/uL — AB (ref 4.0–10.3)
lymph#: 0.7 10*3/uL — ABNORMAL LOW (ref 0.9–3.3)

## 2017-08-26 LAB — COMPREHENSIVE METABOLIC PANEL
ALBUMIN: 4.1 g/dL (ref 3.5–5.0)
ALK PHOS: 66 U/L (ref 40–150)
ALT: <6 U/L (ref 0–55)
AST: 14 U/L (ref 5–34)
Anion Gap: 7 mEq/L (ref 3–11)
BILIRUBIN TOTAL: 0.58 mg/dL (ref 0.20–1.20)
BUN: 26.2 mg/dL — ABNORMAL HIGH (ref 7.0–26.0)
CALCIUM: 9.7 mg/dL (ref 8.4–10.4)
CO2: 28 mEq/L (ref 22–29)
Chloride: 102 mEq/L (ref 98–109)
Creatinine: 1.2 mg/dL (ref 0.7–1.3)
EGFR: 59 mL/min/{1.73_m2} — ABNORMAL LOW (ref >90)
Glucose: 107 mg/dl (ref 70–140)
POTASSIUM: 3.8 meq/L (ref 3.5–5.1)
Sodium: 137 mEq/L (ref 136–145)
TOTAL PROTEIN: 9.4 g/dL — AB (ref 6.4–8.3)

## 2017-08-26 LAB — LACTATE DEHYDROGENASE: LDH: 125 U/L (ref 125–245)

## 2017-08-26 LAB — FERRITIN: Ferritin: 308 ng/ml (ref 22–316)

## 2017-08-27 LAB — BETA 2 MICROGLOBULIN, SERUM: BETA 2: 2.9 mg/L — AB (ref 0.6–2.4)

## 2017-08-27 LAB — KAPPA/LAMBDA LIGHT CHAINS
Ig Kappa Free Light Chain: 17.6 mg/L (ref 3.3–19.4)
Ig Lambda Free Light Chain: 279.5 mg/L — ABNORMAL HIGH (ref 5.7–26.3)
Kappa/Lambda FluidC Ratio: 0.06 — ABNORMAL LOW (ref 0.26–1.65)

## 2017-08-27 LAB — VITAMIN B12: VITAMIN B 12: 483 pg/mL (ref 232–1245)

## 2017-08-28 LAB — MULTIPLE MYELOMA PANEL, SERUM
ALBUMIN SERPL ELPH-MCNC: 4.3 g/dL (ref 2.9–4.4)
ALPHA 1: 0.3 g/dL (ref 0.0–0.4)
Albumin/Glob SerPl: 1 (ref 0.7–1.7)
Alpha2 Glob SerPl Elph-Mcnc: 0.8 g/dL (ref 0.4–1.0)
B-Globulin SerPl Elph-Mcnc: 0.8 g/dL (ref 0.7–1.3)
Gamma Glob SerPl Elph-Mcnc: 2.9 g/dL — ABNORMAL HIGH (ref 0.4–1.8)
Globulin, Total: 4.7 g/dL — ABNORMAL HIGH (ref 2.2–3.9)
IGA/IMMUNOGLOBULIN A, SERUM: 72 mg/dL (ref 61–437)
IGM (IMMUNOGLOBIN M), SRM: 31 mg/dL (ref 15–143)
IgG, Qn, Serum: 3430 mg/dL — ABNORMAL HIGH (ref 700–1600)
M Protein SerPl Elph-Mcnc: 2.6 g/dL — ABNORMAL HIGH
Total Protein: 9 g/dL — ABNORMAL HIGH (ref 6.0–8.5)

## 2017-08-29 ENCOUNTER — Ambulatory Visit (INDEPENDENT_AMBULATORY_CARE_PROVIDER_SITE_OTHER): Payer: Medicare Other | Admitting: *Deleted

## 2017-08-29 DIAGNOSIS — I639 Cerebral infarction, unspecified: Secondary | ICD-10-CM | POA: Diagnosis not present

## 2017-08-29 NOTE — Progress Notes (Signed)
Carelink Summary Report / Loop Recorder 

## 2017-09-01 NOTE — Progress Notes (Signed)
[name] Kitchen    HEMATOLOGY/ONCOLOGY CLINIC NOTE  Date of Service: 09/02/2017  Patient Care Team: Mayra Neer, MD as PCP - General (Family Medicine)  CHIEF COMPLAINTS/PURPOSE OF CONSULTATION:   F/u for Plasma cell neoplasm/Multiple myeloma  HISTORY OF PRESENTING ILLNESS:   Justin [name] is a wonderful 76 y.o. 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  Patient has a history of hypertension, diabetes, GERD, gout, squamous cell skin cancers, elevated PSA, SVT status post ablation in May 2016, chronic back pain who has a history of recurrent CVA/TIA's. He apparently had a left hemispheric TIA June 2017 and has had recurrent) subcortical infarcts thought to be related to small vessel disease. He was noted to have a small PFO that was of questionable significance. He was initially on aspirin and then continued and aspirin + plavix for secondary stroke prevention. He follows with Dr. Antony Contras for his neurology cares.  An SPEP was done due to elevated total proteins of 8.9 and showed an M spike of 2 g/dL. He was also noted to have an elevated total protein of 8.6 in April 2017. He was referred to [place] for further evaluation of his M spike. Blood test did not reveal any hypercalcemia, no significant renal failure. He has been noted to have developed anemia since June the serum and his hemoglobin was 12.6 and is now down to the mid 10 range. He notes no focal bone pains at this time. Overt acute weight loss. No fevers no chills no night sweats.  INTERVAL HISTORY  Mr. Dubey is here for follow-up of his myeloma. His trip to [place], [place] went well. Since his surgery, he has been healing well and has no problems with his walking. Generally, he has been feeling well. He has been more active and working outside which he says could be a cause of his fatigue. He now takes full aspirin now and does not take plavix. He denies having shingles  previously. Patient would like to take on treatment "mildly." He has been on a spiritual journey and has been meditating more often.  On review of systems, denies any new bone pains, fevers, change in urination or BM. No other acute new symptoms.   MEDICAL HISTORY:  Past Medical History:  Diagnosis Date  . Anemia   . Arthritis   . 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 Diabetes mellitus without complication ([place])   . GERD (gastroesophageal reflux disease)   . Gout   . History of loop recorder   . Hypertension   . Left leg weakness   . Paroxysmal SVT (supraventricular tachycardia) ([place])    a. s/p RFCA on 05/01/15  . Stroke [facility])    TIA and mild stroke    SURGICAL HISTORY: Past Surgical History:  Procedure Laterality Date  . ATRIAL FIBRILLATION ABLATION     Dr. Lovena Le  . BASAL CELL CARCINOMA EXCISION     off of back  . ELECTROPHYSIOLOGIC STUDY N/A 05/01/2015   Procedure: SVT Ablation;  Surgeon: Evans Lance, MD;  Location: [facility];  Service: Cardiovascular;  Laterality: N/A;  . EP IMPLANTABLE DEVICE N/A 06/04/2016   Procedure: Loop Recorder Insertion;  Surgeon: Thompson Grayer, MD;  Location: [facility];  Service: Cardiovascular;  Laterality: N/A;  . INGUINAL HERNIA REPAIR Bilateral 06/16/2017   Procedure: LAPAROSCOPIC BILATERAL INGUINAL HERNIA REPAIR;  Surgeon: Clovis Riley,  MD;  Location: [facility];  Service: General;  Laterality: Bilateral;  . INSERTION OF MESH Bilateral 06/16/2017   Procedure: INSERTION OF MESH;  Surgeon: Clovis Riley, MD;  Location: [facility];  Service: General;  Laterality: Bilateral;  . SQUAMOUS CELL CARCINOMA EXCISION    . TEE WITHOUT CARDIOVERSION N/A 06/04/2016   Procedure: TRANSESOPHAGEAL ECHOCARDIOGRAM (TEE);  Surgeon: Pixie Casino, MD;  Location: [facility];  Service: Cardiovascular;  Laterality: N/A;    SOCIAL HISTORY: Social History   Social History  . Marital status: Married    Spouse  name: N/A  . Number of children: N/A  . Years of education: N/A   Occupational History  . Not on file.   Social History Main Topics  . Smoking status: Never Smoker  . Smokeless tobacco: Never Used  . Alcohol use No  . Drug use: No  . Sexual activity: Not on file   Other Topics Concern  . Not on file   Social History Narrative  . No narrative on file    FAMILY HISTORY: Family History  Problem Relation Age of Onset  . Stroke Mother   . Hypertension Mother   . Alzheimer's disease Father   . Heart failure Father   . Down syndrome Daughter     ALLERGIES:  is allergic to no known allergies.  MEDICATIONS:  Current Outpatient Prescriptions  Medication Sig Dispense Refill  . allopurinol (ZYLOPRIM) 300 MG tablet Take 300 mg by mouth daily.     [name] Kitchen aspirin EC 81 MG tablet Take 81 mg by mouth daily.    [name] Kitchen atorvastatin (LIPITOR) 40 MG tablet Take 1 tablet (40 mg total) by mouth every morning. 90 tablet 3  . fluorouracil (EFUDEX) 5 % cream Apply 1 application topically daily as needed (for skin cancer prevention).   0  . glucose monitoring kit (FREESTYLE) monitoring kit 1 each by Does not apply route as needed for other.    . metFORMIN (GLUCOPHAGE) 1000 MG tablet Take 1,000 mg by mouth daily with breakfast.    . nitroGLYCERIN (NITROSTAT) 0.4 MG SL tablet Place 1 tablet (0.4 mg total) under the tongue every 5 (five) minutes as needed for chest pain. 25 tablet 3  . omeprazole (PRILOSEC) 20 MG capsule Take 20 mg by mouth every morning.     [name] Kitchen telmisartan-hydrochlorothiazide (MICARDIS HCT) 80-12.5 MG tablet Take 1 tablet by mouth daily. 90 tablet 3  . traMADol (ULTRAM) 50 MG tablet Take 1 tablet (50 mg total) by mouth every 6 (six) hours as needed. 20 tablet 0  . vitamin B-12 (CYANOCOBALAMIN) 1000 MCG tablet Take 1,000 mcg by mouth daily.     No current facility-administered medications for this visit.     REVIEW OF SYSTEMS:   10 Point review of Systems was done is negative except as  noted above.  PHYSICAL EXAMINATION: ECOG PERFORMANCE STATUS: 2 - Symptomatic, <50% confined to bed  . Vitals:   09/02/17 0921  BP: 110/65  Pulse: 68  Resp: 20  Temp: 97.9 F (36.6 C)  SpO2: 100%   Filed Weights   09/02/17 0921  Weight: 158 lb 11.2 oz (72 kg)   .Body mass index is 23.44 kg/m.  GENERAL:alert, in no acute distress and comfortable SKIN: skin color, texture, turgor are normal, no rashes or significant lesions EYES: normal, conjunctiva are pink and non-injected, sclera clear OROPHARYNX:no exudate, no erythema and lips, buccal mucosa, and tongue normal  NECK: supple, no JVD, thyroid normal size, non-tender, without nodularity LYMPH:  no palpable lymphadenopathy in the cervical, axillary or inguinal LUNGS: clear to auscultation with normal respiratory effort HEART: regular rate & rhythm,  no murmurs and no lower extremity edema ABDOMEN: abdomen soft, non-tender, normoactive bowel sounds  Musculoskeletal: no cyanosis of digits and no clubbing  PSYCH: alert & oriented x 3 with fluent speech NEURO: no focal motor/sensory deficits  LABORATORY DATA:  I have reviewed the data as listed. CBC Latest Ref Rng & Units 08/26/2017 07/08/2017 06/16/2017  WBC 4.0 - 10.3 10e3/uL 3.0(L) 3.8(L) 4.3  Hemoglobin 13.0 - 17.1 g/dL 10.0(L) 10.4(L) 11.0(L)  Hematocrit 38.4 - 49.9 % 29.4(L) 33.2(L) 34.1(L)  Platelets 140 - 400 10e3/uL 128(L) 334 200    CMP Latest Ref Rng & Units 08/26/2017 08/26/2017 07/08/2017  Glucose 70 - 140 mg/dl 107 - 121  BUN 7.0 - 26.0 mg/dL 26.2(H) - 27.5(H)  Creatinine 0.7 - 1.3 mg/dL 1.2 - 1.2  Sodium 136 - 145 mEq/L 137 - 138  Potassium 3.5 - 5.1 mEq/L 3.8 - 4.2  Chloride 101 - 111 mmol/L - - -  CO2 22 - 29 mEq/L 28 - 27  Calcium 8.4 - 10.4 mg/dL 9.7 - 10.1  Total Protein 6.4 - 8.3 g/dL 9.4(H) 9.0(H) 9.4(H)  Total Bilirubin 0.20 - 1.20 mg/dL 0.58 - 0.34  Alkaline Phos 40 - 150 U/L 66 - 69  AST 5 - 34 U/L 14 - 18  ALT 0-55 U/L U/L <6 - 8   . Component      Latest Ref Rng & Units 08/26/2017  LDH     125 - 245 U/L 125  Beta 2     0.6 - 2.4 mg/L 2.9 (H)  Ferritin     22 - 316 ng/ml 308  Vitamin B12     232 - 1,245 pg/mL 483    Lab Results  Component Value Date   IRON 45 06/02/2017   TIBC 233 06/02/2017   IRONPCTSAT 19 (L) 06/02/2017   (Iron and TIBC)  Lab Results  Component Value Date   FERRITIN 308 08/26/2017           RADIOGRAPHIC STUDIES: I have personally reviewed the radiological images as listed and agreed with the findings in the report.  Bone Marrow Biopsy 12/24/2016 (Accession [id number])  Diagnosis Bone Marrow, Aspirate,Biopsy, and Clot, left iliac crest - HYPERCELLULAR BONE MARROW FOR AGE WITH PLASMA CELL NEOPLASM. - SEE COMMENT. PERIPHERAL BLOOD: - NORMOCYTIC-NORMOCHROMIC ANEMIA. - LEUKOPENIA.  DG Bone Survey Met (Accession [id number]) (Order [id number])  Imaging  Date: 11/28/2016 Department: [facility] Released By: Pricilla Riffle Authorizing: Brunetta Genera, MD  Exam Information   Status Exam Begun  Exam Ended   Final [99] 11/28/2016 11:59 AM 11/28/2016 12:36 PM  PACS Images   Show images for DG Bone Survey Met  Study Result   CLINICAL DATA:  Monoclonal gammopathy of unknown significance. History of squamous cell carcinoma excision, basal cell carcinoma excision.  EXAM: METASTATIC BONE SURVEY  COMPARISON:  CT neck, chest, abdomen and pelvis from 10/29/16, MRI of the head from 06/01/2016, CXR 10/27/2016, lumbar spine radiographs 06/24/2016  FINDINGS: Lateral skull: Small occipital lucency which may represent a normal arachnoid granulation posteriorly in the occiput. No definite lytic abnormality.  Cervical spine AP and lateral: Disc space narrowing at C2-3, and from C4 through C7. C4-5, C5-6 and C6-7 uncovertebral joint spurring bilaterally. No lytic abnormality.  Thoracic spine AP and lateral: T8-9 right-sided osteophytes and to a lesser  degree T9-10. No lytic abnormality. Slight  multilevel lumbar thoracic disc space narrowing likely degenerative.  Lumbar spine AP and lateral: Disc space narrowing at L4-5 and to a greater extent L5-S1. No lytic disease. L3 through S1 facet sclerosis.  AP pelvis: Negative for lytic disease.  Bilateral upper and lower extremities shoulders through wrist and from the hips through ankle: Negative for lytic disease. Joint space narrowing of the femorotibial compartments both knees. Subchondral cyst of the right patella.  CXR: Clear lungs. Cardiac implantable monitoring device projects over the left heart. Aortic atherosclerosis. No lytic disease.  IMPRESSION: No findings suspicious for lytic disease.   Electronically Signed   By: Ashley Royalty M.D.   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 (12/2016) CYtogenetics and Myeloma FISH panel.- trisomy 11 Patient has normocytic anemia without any other clear etiology. (>2g/dl lower that lower limit of normal which is 13) which per criteria would place this in the Multiple myeloma category as opposed to Smoldering Multiple myeloma (Borderline criterion)  No overt renal failure or hypercalcemia at this time No focal bone pains though he has significant chronic back pain related to degenerative disc disease. Bone survey shows no overt lytic lesions. Myeloma panel M spike increasing gradually and is up to 2.3g/dl with increasing Lambda FLC - Hemoglobin slowly worsening, 10.0 today. igG and M-protein both shows slow progression of myeloma - discussed targeted treatment therapy for his worsening myeloma.He is aware If we hold off too long, he will begin to become symptomatic. Discussed the possible side effects. He is leaning toward dexamethasone once weekly and Ninlaro    2) Borderline low B12 levels  299 --- on replacement - now improved to 482 -continue replacement. PLAN  --We discussed all the lab results available from last week. Myeloma markers gradually progressing. Hemoglobin is down to 10. No new hypercalcemia or renal failure or new bone pains. -Patient is about 2 months out from his bilateral inguinal hernia surgeries and has healed well. No issues with excessive bleeding. -We discussed indications for treatment and he is agreeable to pursue treatment with Ninlaro + Dexamethasone.  -We discussed the role and cons potential toxicities of treatment. -He is hesitant to consider adding Revlimid given his history of recurrent CVAs chooses understandable. -Will need acyclovir for shingles prophylaxis. -Prescription for Kennieth Rad has been written and is under the approval process. - provide print outs of Dexamethasone and Ninlaro   - chemo-education class   2) recurrent CVA - thought to be related to small vessel disease as per neurology. Has a small PFO which could be an additional risk factor. -on ASA  as per neurology (Dr Leonie Man)  3) Patient Active Problem List   Diagnosis Date Noted  . History of loop recorder 06/12/2017  . Cryptogenic stroke ([place]) 10/28/2016  . Gait abnormality   . History of CVA (cerebrovascular accident)   . History of TIA (transient ischemic attack)   . Benign essential HTN   . Paroxysmal SVT (supraventricular tachycardia) ([place])   . Acute blood loss anemia   . Acute ischemic stroke ([place])   . CVA (cerebral vascular accident) ([place]) 10/26/2016  . History of recent stroke 06/06/2016  . 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 TIA (transient ischemic attack) 06/02/2016  . Numbness 06/01/2016  . Right arm numbness 06/01/2016  . Atherosclerosis of native coronary artery of native heart  without angina pectoris 03/20/2016  . Hypertension   . GERD (gastroesophageal reflux disease)   . Gout   . S/P RF ablation operation for arrhythmia 12/13/2014  .  Hyperlipidemia 12/13/2014  . Controlled type 2 diabetes mellitus with complication, without long-term current use of insulin ([place]) 12/07/2014   PLAN -Continue f/u with PCP (blood sugars) RTC with Dr Irene Limbo in 4 weeks for toxicity check with labs  All of the patients questions were answered with apparent satisfaction. The patient knows to call the clinic with any problems, questions or concerns.  I spent 30 minutes counseling the patient face to face. The total time spent in the appointment was 40 minutes and more than 50% was on counseling and direct patient cares.   This document serves as a record of services personally performed by Sullivan Lone, MD. It was created on her behalf by Brandt Loosen, a trained medical scribe. The creation of this record is based on the scribe's personal observations and the provider's statements to them. This document has been checked and approved by the attending provider.  Sullivan Lone MD [place] AAHIVMS [facility] Hematology/Oncology Physician [facility]  (Office):       [phone] (Work cell):  [phone] (Fax):           [phone]

## 2017-09-02 ENCOUNTER — Encounter: Payer: Self-pay | Admitting: Hematology

## 2017-09-02 ENCOUNTER — Telehealth: Payer: Self-pay | Admitting: Internal Medicine

## 2017-09-02 ENCOUNTER — Telehealth: Payer: Self-pay

## 2017-09-02 ENCOUNTER — Ambulatory Visit (HOSPITAL_BASED_OUTPATIENT_CLINIC_OR_DEPARTMENT_OTHER): Payer: Medicare Other | Admitting: Hematology

## 2017-09-02 ENCOUNTER — Other Ambulatory Visit: Payer: Self-pay

## 2017-09-02 VITALS — BP 110/65 | HR 68 | Temp 97.9°F | Resp 20 | Ht 69.0 in | Wt 158.7 lb

## 2017-09-02 DIAGNOSIS — Z8673 Personal history of transient ischemic attack (TIA), and cerebral infarction without residual deficits: Secondary | ICD-10-CM | POA: Diagnosis not present

## 2017-09-02 DIAGNOSIS — C9000 Multiple myeloma not having achieved remission: Secondary | ICD-10-CM

## 2017-09-02 DIAGNOSIS — E538 Deficiency of other specified B group vitamins: Secondary | ICD-10-CM

## 2017-09-02 DIAGNOSIS — D649 Anemia, unspecified: Secondary | ICD-10-CM

## 2017-09-02 MED ORDER — ATORVASTATIN CALCIUM 40 MG PO TABS: 40.0000 mg | ORAL_TABLET | ORAL | 3 refills | Status: DC

## 2017-09-02 MED ORDER — IXAZOMIB CITRATE 4 MG PO CAPS: 4.0000 mg | ORAL_CAPSULE | ORAL | 2 refills | Status: DC

## 2017-09-02 NOTE — Telephone Encounter (Signed)
Pt needs refill of Lipitor 40 mg to [facility] 90 day supply-pt [phone]

## 2017-09-02 NOTE — Telephone Encounter (Signed)
Printed avs and calender for October. Per 9/11 los

## 2017-09-02 NOTE — Telephone Encounter (Signed)
[facility] for [facility]

## 2017-09-03 ENCOUNTER — Telehealth: Payer: Self-pay

## 2017-09-03 ENCOUNTER — Telehealth: Payer: Self-pay | Admitting: *Deleted

## 2017-09-03 LAB — CUP PACEART REMOTE DEVICE CHECK
Implantable Pulse Generator Implant Date: 20170613
MDC IDC SESS DTM: 20180907084209

## 2017-09-03 NOTE — Telephone Encounter (Signed)
Explained to pt that appt with Dr. Irene Limbo is 1 month out because he expects 1-2 weeks for the ninlaro to be started. The f/u is intended to be two weeks after ninlaro start. Message sent to [facility] to get rough estimate of expected send/delivery date.

## 2017-09-03 NOTE — Telephone Encounter (Signed)
Pt called with concern that appts were scheduled for Oct & he thought that he was to come back in 2 wks.  He just wants to confirm that appt is correct.  He states he was to get 2 prescriptions, Ninlaro & a steroid.  Informed that Kennieth Rad will be filled by [facility] Pharmacist will review & make sure that script is sent to correct pharmacy & if copay is an issue will work to get assistance.  This process may take a while.  Informed that I don't see a script for steroid.  Message to Dr Irene Limbo to verify scripts & appts.

## 2017-09-08 ENCOUNTER — Telehealth: Payer: Self-pay | Admitting: Pharmacist

## 2017-09-08 ENCOUNTER — Telehealth: Payer: Self-pay | Admitting: Pharmacy Technician

## 2017-09-08 DIAGNOSIS — C9000 Multiple myeloma not having achieved remission: Secondary | ICD-10-CM

## 2017-09-08 MED ORDER — DEXAMETHASONE 4 MG PO TABS: ORAL_TABLET | ORAL | 2 refills | Status: DC

## 2017-09-08 MED ORDER — IXAZOMIB CITRATE 4 MG PO CAPS: ORAL_CAPSULE | ORAL | 2 refills | Status: DC

## 2017-09-08 MED ORDER — ACYCLOVIR 400 MG PO TABS
400.0000 mg | ORAL_TABLET | Freq: Every day | ORAL | 2 refills | Status: DC
Start: 2017-09-08 — End: 2017-12-30

## 2017-09-08 NOTE — Telephone Encounter (Signed)
Oral Oncology Pharmacist Encounter  Received new prescription for Ninlaro for the treatment of newly diagnosed multiple myeloma in conjunction with dexamethasone, planned duration until disease progression or unacceptable toxicity. Revlimid will not be used due to previous CVA/TIA and risk of thromboembolism with use of immunomodulatory agents.  Labs from 08/26/17 assessed, OK for treatment. Scr=1.2, est CrCl ~50, no dose adjustments needed at this time.  Current medication list in Epic reviewed, no DDIs with Ninlaro identified.  Prescription has been e-scribed to the [facility] for benefits analysis and approval. Prescriptions for acyclovir for VZV ppx, and dexamethasone have also been e-scribed to [facility]. Ninlaro prior authorization has been denied, this decision will be appealed.  Oral Oncology Clinic will continue to follow for insurance authorization, copayment issues, initial counseling and start date.  Johny Drilling, PharmD, BCPS, BCOP 09/08/2017 10:20 AM Oral Oncology Clinic [phone]

## 2017-09-08 NOTE — Telephone Encounter (Signed)
Oral Oncology Patient Advocate Encounter  Received notification from [place] that prior authorization for Kennieth Rad is required.  PA submitted via phone.   Status is pending  Oral Oncology Clinic will continue to follow. [name]. [name], [place] Patient [place] [phone] 09/08/2017 12:48 PM

## 2017-09-08 NOTE — Telephone Encounter (Signed)
Oral Oncology Patient Advocate Encounter  Received notification from [employer] that the request for prior authorization for Justin [name] has been denied due to the fact that is not being prescribed with Revlimid, Pomalysyt or Thalomid.    This determination will be appealed.   This encounter will continue to be updated until final determination.    Justin [name]. Justin [name], Justin [name] Patient [place] [phone] 09/08/2017 2:05 PM

## 2017-09-09 NOTE — Telephone Encounter (Signed)
Oral Chemotherapy Pharmacist Encounter   I spoke with patient for overview of new oral chemotherapy medication: Ninlaro for the treatment of newly diagnosed multiple myeloma in conjunction with dexamethasone, planned duration until disease progression or unacceptable toxicity.   Patient updated on appeal process and expected time frame for medication acquisition.  Counseled patient on administration, dosing, side effects, safe handling, and monitoring. Patient will take Ninlaro 55m capsules, 1 capsule by mouth once weekly. Take on an empty stomach, 1 hour before or 2 hours after a meal. Take on days 1, 8, and 15 of each 28 day cycle. Patient will take dexamethasone 451mtablets, 5 tabs (2048mby mouth once weekly on days 1, 8, 15, and 21 of each 28 day cycle.  Side effects include but not limited to: peripheral edema, constipation, nausea, vomiting, decreased blood counts, and peripheral neuropathy.    Reviewed with patient importance of keeping a medication schedule and plan for any missed doses.  Mr. HawRioloiced understanding and appreciation.   All questions answered.  Will follow up with patient regarding insurance and pharmacy once appeal has been overturned by [employer] Patient knows to call the office with questions or concerns. Oral Oncology Clinic will continue to follow.  Thank you,  JesJohny DrillingharmD, BCPS, BCOP 09/09/2017  10:25 AM Oral Oncology Clinic [phone]

## 2017-09-11 NOTE — Telephone Encounter (Signed)
Oral Oncology Patient Advocate Encounter   Submitted an appeal packet to [facility] in an effort to reverse the insurance company's initial denial of coverage for Ninlaro.    Packet faxed to [phone]  This encounter will continue to be updated until final determination.    [name]. [name], [place] Patient [place] [phone] 09/11/2017 3:57 PM

## 2017-09-17 DIAGNOSIS — I639 Cerebral infarction, unspecified: Secondary | ICD-10-CM | POA: Diagnosis not present

## 2017-09-17 DIAGNOSIS — D509 Iron deficiency anemia, unspecified: Secondary | ICD-10-CM | POA: Diagnosis not present

## 2017-09-17 DIAGNOSIS — Z0000 Encounter for general adult medical examination without abnormal findings: Secondary | ICD-10-CM | POA: Diagnosis not present

## 2017-09-17 DIAGNOSIS — K5730 Diverticulosis of large intestine without perforation or abscess without bleeding: Secondary | ICD-10-CM | POA: Diagnosis not present

## 2017-09-17 DIAGNOSIS — I471 Supraventricular tachycardia: Secondary | ICD-10-CM | POA: Diagnosis not present

## 2017-09-17 DIAGNOSIS — E1159 Type 2 diabetes mellitus with other circulatory complications: Secondary | ICD-10-CM | POA: Diagnosis not present

## 2017-09-17 DIAGNOSIS — I10 Essential (primary) hypertension: Secondary | ICD-10-CM | POA: Diagnosis not present

## 2017-09-17 DIAGNOSIS — K219 Gastro-esophageal reflux disease without esophagitis: Secondary | ICD-10-CM | POA: Diagnosis not present

## 2017-09-17 DIAGNOSIS — C9000 Multiple myeloma not having achieved remission: Secondary | ICD-10-CM | POA: Diagnosis not present

## 2017-09-17 DIAGNOSIS — Z23 Encounter for immunization: Secondary | ICD-10-CM | POA: Diagnosis not present

## 2017-09-17 DIAGNOSIS — Z1211 Encounter for screening for malignant neoplasm of colon: Secondary | ICD-10-CM | POA: Diagnosis not present

## 2017-09-17 DIAGNOSIS — I2510 Atherosclerotic heart disease of native coronary artery without angina pectoris: Secondary | ICD-10-CM | POA: Diagnosis not present

## 2017-09-22 NOTE — Telephone Encounter (Signed)
Oral Chemotherapy Pharmacist Encounter   I called patient today to follow-up on additional questions about Ninlaro. I had extensive conversation with patient about mechanism of plasma cell neoplasm, proteosome inhibition, and prognosis of disease.  Patient very unsure about treatment decision. He understands the office needs his permission to appeal denial of Ninlaro on his behalf. This appeal may take several weeks for determination and cannot proceed without his written authorization. He will call the office to set a time for signing authorization.  Patient knows to call the office with questions or concerns.  Oral Oncology Clinic will continue to follow.  Thank you,  Johny Drilling, PharmD, BCPS, BCOP 09/22/2017 4:55 PM Oral Oncology Clinic [phone]

## 2017-09-23 NOTE — Telephone Encounter (Signed)
Oral Oncology Patient Advocate Encounter  Mr. [employer] administrator) requires that the patient provide signed consent for the office to submit the appeal on his behalf.    The patient is going to contact the office if/when he is willing sign the consent form and move forward with the appeal.   [name]. [name], [place] Patient [place] [phone] 09/23/2017 3:39 PM

## 2017-09-24 NOTE — Telephone Encounter (Signed)
Oral Oncology Patient Advocate Encounter  Justin [name] signed the consent form today and the completed appeal packet was sent to [facility].    [name]. [name], [name] [phone] 09/24/2017 3:20 PM

## 2017-09-29 ENCOUNTER — Ambulatory Visit (INDEPENDENT_AMBULATORY_CARE_PROVIDER_SITE_OTHER): Payer: Medicare Other | Admitting: *Deleted

## 2017-09-29 DIAGNOSIS — I639 Cerebral infarction, unspecified: Secondary | ICD-10-CM | POA: Diagnosis not present

## 2017-09-29 NOTE — Progress Notes (Signed)
[name] Kitchen    HEMATOLOGY/ONCOLOGY CLINIC NOTE  Date of Service: .09/30/2017  Patient Care Team: Mayra Neer, MD as PCP - General (Family Medicine)  CHIEF COMPLAINTS/PURPOSE OF CONSULTATION:   F/u for Plasma cell neoplasm/Multiple myeloma  HISTORY OF PRESENTING ILLNESS:   Justin [name] is a wonderful 76 y.o. 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  Patient has a history of hypertension, diabetes, GERD, gout, squamous cell skin cancers, elevated PSA, SVT status post ablation in May 2016, chronic back pain who has a history of recurrent CVA/TIA's. He apparently had a left hemispheric TIA June 2017 and has had recurrent) subcortical infarcts thought to be related to small vessel disease. He was noted to have a small PFO that was of questionable significance. He was initially on aspirin and then continued and aspirin + plavix for secondary stroke prevention. He follows with Dr. Antony Contras for his neurology cares.  An SPEP was done due to elevated total proteins of 8.9 and showed an M spike of 2 g/dL. He was also noted to have an elevated total protein of 8.6 in April 2017. He was referred to [place] for further evaluation of his M spike. Blood test did not reveal any hypercalcemia, no significant renal failure. He has been noted to have developed anemia since June the serum and his hemoglobin was 12.6 and is now down to the mid 10 range. He notes no focal bone pains at this time. Overt acute weight loss. No fevers no chills no night sweats.  INTERVAL HISTORY  Pt presents to the office today for follow-up of his myeloma. He reports that he is doing well overall. He states that he was not yet able to get approval for ninlaro. Our oral chemotherapy pharmacist is currently continuing to work on this. He is asymptomatic. He is planning to travel to with his son in the next few weeks. He Is still meditating and notes that he is in a good  place.  On review of systems, pt reports lower back pain which has resolved and denies fever, chills, HA, new one pains, and any other acute accompanying symptoms.     MEDICAL HISTORY:  Past Medical History:  Diagnosis Date  . Anemia   . Arthritis   . 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 Diabetes mellitus without complication ([place])   . GERD (gastroesophageal reflux disease)   . Gout   . History of loop recorder   . Hypertension   . Left leg weakness   . Paroxysmal SVT (supraventricular tachycardia) ([place])    a. s/p RFCA on 05/01/15  . Stroke [facility])    TIA and mild stroke    SURGICAL HISTORY: Past Surgical History:  Procedure Laterality Date  . ATRIAL FIBRILLATION ABLATION     Dr. Lovena Le  . BASAL CELL CARCINOMA EXCISION     off of back  . ELECTROPHYSIOLOGIC STUDY N/A 05/01/2015   Procedure: SVT Ablation;  Surgeon: Evans Lance, MD;  Location: [facility];  Service: Cardiovascular;  Laterality: N/A;  . EP IMPLANTABLE DEVICE N/A 06/04/2016   Procedure: Loop Recorder Insertion;  Surgeon: Thompson Grayer, MD;  Location: [facility] [facility];  Service: Cardiovascular;  Laterality: N/A;  . INGUINAL HERNIA REPAIR Bilateral 06/16/2017   Procedure: LAPAROSCOPIC BILATERAL INGUINAL HERNIA REPAIR;  Surgeon: Clovis Riley, MD;  Location: [place];  Service: General;  Laterality: Bilateral;  .  INSERTION OF MESH Bilateral 06/16/2017   Procedure: INSERTION OF MESH;  Surgeon: Clovis Riley, MD;  Location: [facility];  Service: General;  Laterality: Bilateral;  . SQUAMOUS CELL CARCINOMA EXCISION    . TEE WITHOUT CARDIOVERSION N/A 06/04/2016   Procedure: TRANSESOPHAGEAL ECHOCARDIOGRAM (TEE);  Surgeon: Pixie Casino, MD;  Location: [facility];  Service: Cardiovascular;  Laterality: N/A;    SOCIAL HISTORY: Social History   Social History  . Marital status: Married    Spouse name: N/A  . Number of children: N/A  . Years of education: N/A   Occupational  History  . Not on file.   Social History Main Topics  . Smoking status: Never Smoker  . Smokeless tobacco: Never Used  . Alcohol use No  . Drug use: No  . Sexual activity: Not on file   Other Topics Concern  . Not on file   Social History Narrative  . No narrative on file    FAMILY HISTORY: Family History  Problem Relation Age of Onset  . Stroke Mother   . Hypertension Mother   . Alzheimer's disease Father   . Heart failure Father   . Down syndrome Daughter     ALLERGIES:  is allergic to no known allergies.  MEDICATIONS:  Current Outpatient Prescriptions  Medication Sig Dispense Refill  . acyclovir (ZOVIRAX) 400 MG tablet Take 1 tablet (400 mg total) by mouth daily. 30 tablet 2  . allopurinol (ZYLOPRIM) 300 MG tablet Take 300 mg by mouth daily.     [name] Kitchen aspirin 325 MG tablet Take 325 mg by mouth daily.    [name] Kitchen atorvastatin (LIPITOR) 40 MG tablet Take 1 tablet (40 mg total) by mouth every morning. 90 tablet 3  . cyanocobalamin 1000 MCG tablet Take 1,000 mcg by mouth daily.    [name] Kitchen dexamethasone (DECADRON) 4 MG tablet Take 5 tablets (28m) by mouth once weekly. Take on days 1, 8, 15, and 21 of every 28day cycle 20 tablet 2  . fluorouracil (EFUDEX) 5 % cream Apply 1 application topically daily as needed (for skin cancer prevention).   0  . glucose monitoring kit (FREESTYLE) monitoring kit 1 each by Does not apply route as needed for other.    . ixazomib citrate (NINLARO) 4 MG capsule Take 1 capsule by mouth once weekly on days 1, 8, and 15, of each 28 day cycle. Take on an empty stomach 1hr before or 2hrs after food. 3 capsule 2  . metFORMIN (GLUCOPHAGE) 1000 MG tablet Take 1,000 mg by mouth daily with breakfast.    . nitroGLYCERIN (NITROSTAT) 0.4 MG SL tablet Place 1 tablet (0.4 mg total) under the tongue every 5 (five) minutes as needed for chest pain. 25 tablet 3  . omeprazole (PRILOSEC) 20 MG capsule Take 20 mg by mouth every morning.     .[name] Kitchentelmisartan-hydrochlorothiazide  (MICARDIS HCT) 80-12.5 MG tablet Take 1 tablet by mouth daily. 90 tablet 3   No current facility-administered medications for this visit.     REVIEW OF SYSTEMS:   10 Point review of Systems was done is negative except as noted above.  PHYSICAL EXAMINATION: ECOG PERFORMANCE STATUS: 2 - Symptomatic, <50% confined to bed  . Vitals:   09/30/17 1159  BP: (!) 143/61  Pulse: 69  Resp: 18  Temp: 97.6 F (36.4 C)  SpO2: 98%   Filed Weights   09/30/17 1159  Weight: 160 lb 14.4 oz (73 kg)   .Body mass index is 23.76 kg/m.  GENERAL:alert, in  no acute distress and comfortable SKIN: skin color, texture, turgor are normal, no rashes or significant lesions EYES: normal, conjunctiva are pink and non-injected, sclera clear OROPHARYNX:no exudate, no erythema and lips, buccal mucosa, and tongue normal  NECK: supple, no JVD, thyroid normal size, non-tender, without nodularity LYMPH:  no palpable lymphadenopathy in the cervical, axillary or inguinal LUNGS: clear to auscultation with normal respiratory effort HEART: regular rate & rhythm,  no murmurs and no lower extremity edema ABDOMEN: abdomen soft, non-tender, normoactive bowel sounds  Musculoskeletal: no cyanosis of digits and no clubbing  PSYCH: alert & oriented x 3 with fluent speech NEURO: no focal motor/sensory deficits  LABORATORY DATA:  I have reviewed the data as listed. CBC Latest Ref Rng & Units 09/30/2017 08/26/2017 07/08/2017  WBC 4.0 - 10.3 10e3/uL 3.5(L) 3.0(L) 3.8(L)  Hemoglobin 13.0 - 17.1 g/dL 10.4(L) 10.0(L) 10.4(L)  Hematocrit 38.4 - 49.9 % 33.1(L) 29.4(L) 33.2(L)  Platelets 140 - 400 10e3/uL 255 128(L) 334    CMP Latest Ref Rng & Units 09/30/2017 08/26/2017 08/26/2017  Glucose 70 - 140 mg/dl 102 107 -  BUN 7.0 - 26.0 mg/dL 23.6 26.2(H) -  Creatinine 0.7 - 1.3 mg/dL 1.2 1.2 -  Sodium 136 - 145 mEq/L 135(L) 137 -  Potassium 3.5 - 5.1 mEq/L 4.5 3.8 -  Chloride 101 - 111 mmol/L - - -  CO2 22 - 29 mEq/L 28 28 -  Calcium  8.4 - 10.4 mg/dL 9.9 9.7 -  Total Protein 6.4 - 8.3 g/dL 9.9(H) 9.4(H) 9.0(H)  Total Bilirubin 0.20 - 1.20 mg/dL 0.40 0.58 -  Alkaline Phos 40 - 150 U/L 62 66 -  AST 5 - 34 U/L 15 14 -  ALT 0-55 U/L U/L <6 <6 -   . Component     Latest Ref Rng & Units 08/26/2017  LDH     125 - 245 U/L 125  Beta 2     0.6 - 2.4 mg/L 2.9 (H)  Ferritin     22 - 316 ng/ml 308  Vitamin B12     232 - 1,245 pg/mL 483    Lab Results  Component Value Date   IRON 45 06/02/2017   TIBC 233 06/02/2017   IRONPCTSAT 19 (L) 06/02/2017   (Iron and TIBC)  Lab Results  Component Value Date   FERRITIN 308 08/26/2017           RADIOGRAPHIC STUDIES: I have personally reviewed the radiological images as listed and agreed with the findings in the report.  Bone Marrow Biopsy 12/24/2016 (Accession [id number])  Diagnosis Bone Marrow, Aspirate,Biopsy, and Clot, left iliac crest - HYPERCELLULAR BONE MARROW FOR AGE WITH PLASMA CELL NEOPLASM. - SEE COMMENT. PERIPHERAL BLOOD: - NORMOCYTIC-NORMOCHROMIC ANEMIA. - LEUKOPENIA.  DG Bone Survey Met (Accession [id number]) (Order [id number])  Imaging  Date: 11/28/2016 Department: [facility]RAD[facility] Released By: Pricilla Riffle Authorizing: Brunetta Genera, MD  Exam Information   Status Exam Begun  Exam Ended   Final [99] 11/28/2016 11:59 AM 11/28/2016 12:36 PM  PACS Images   Show images for DG Bone Survey Met  Study Result   CLINICAL DATA:  Monoclonal gammopathy of unknown significance. History of squamous cell carcinoma excision, basal cell carcinoma excision.  EXAM: METASTATIC BONE SURVEY  COMPARISON:  CT neck, chest, abdomen and pelvis from 10/29/16, MRI of the head from 06/01/2016, CXR 10/27/2016, lumbar spine radiographs 06/24/2016  FINDINGS: Lateral skull: Small occipital lucency which may represent a normal arachnoid granulation posteriorly in the occiput. No  definite lytic abnormality.  Cervical spine AP  and lateral: Disc space narrowing at C2-3, and from C4 through C7. C4-5, C5-6 and C6-7 uncovertebral joint spurring bilaterally. No lytic abnormality.  Thoracic spine AP and lateral: T8-9 right-sided osteophytes and to a lesser degree T9-10. No lytic abnormality. Slight multilevel lumbar thoracic disc space narrowing likely degenerative.  Lumbar spine AP and lateral: Disc space narrowing at L4-5 and to a greater extent L5-S1. No lytic disease. L3 through S1 facet sclerosis.  AP pelvis: Negative for lytic disease.  Bilateral upper and lower extremities shoulders through wrist and from the hips through ankle: Negative for lytic disease. Joint space narrowing of the femorotibial compartments both knees. Subchondral cyst of the right patella.  CXR: Clear lungs. Cardiac implantable monitoring device projects over the left heart. Aortic atherosclerosis. No lytic disease.  IMPRESSION: No findings suspicious for lytic disease.   Electronically Signed   By: Ashley Royalty M.D.   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 (12/2016) CYtogenetics and Myeloma FISH panel.- trisomy 11 Patient has normocytic anemia without any other clear etiology. (>2g/dl lower that lower limit of normal which is 13) which per criteria would place this in the Multiple myeloma category as opposed to Smoldering Multiple myeloma (Borderline criterion)  No overt renal failure or hypercalcemia at this time No focal bone pains though he has significant chronic back pain related to degenerative disc disease. Bone survey shows no overt lytic lesions. Myeloma panel M spike increasing gradually and is up to 2.6g/dl with increasing Lambda FLC - Hemoglobin slowly worsening, 10.0 today. IgG and M-protein both shows slow progression of myeloma - discussed targeted treatment  therapy for his worsening myeloma.He is aware If we hold off too long, he will begin to become symptomatic. Discussed the possible side effects. He is leaning toward dexamethasone once weekly and Ninlaro    2) Borderline low B12 levels 299 --- on replacement - now improved to 482 -continue replacement.  PLAN:  -patient is still awaiting approval for Ninlaro. Labs steady. Hemoglobin is at 10.4. - No new hypercalcemia or renal failure or new bone pains. -he would like to wait on the Ninlaro and not consider already treatments at this time. -Will need acyclovir for shingles prophylaxis. -Prescription for Kennieth Rad has been written and is under the approval process. - provide print outs of Dexamethasone and Ninlaro   - chemo-education class completed.  2) recurrent CVA - thought to be related to small vessel disease as per neurology. Has a small PFO which could be an additional risk factor. -on ASA  as per neurology (Dr Leonie Man)  3) Patient Active Problem List   Diagnosis Date Noted  . History of loop recorder 06/12/2017  . Cryptogenic stroke ([place]) 10/28/2016  . Gait abnormality   . History of CVA (cerebrovascular accident)   . History of TIA (transient ischemic attack)   . Benign essential HTN   . Paroxysmal SVT (supraventricular tachycardia) ([place])   . Acute blood loss anemia   . Acute ischemic stroke ([place])   . CVA (cerebral vascular accident) ([place]) 10/26/2016  . History of recent stroke 06/06/2016  . 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 TIA (transient ischemic attack) 06/02/2016  . Numbness 06/01/2016  . Right arm numbness 06/01/2016  . Atherosclerosis of native coronary artery of native heart without  angina pectoris 03/20/2016  . Hypertension   . GERD (gastroesophageal reflux disease)   . Gout   . S/P RF ablation operation for arrhythmia 12/13/2014  . Hyperlipidemia 12/13/2014  . Controlled type 2 diabetes mellitus with complication, without long-term current use of  insulin ([place]) 12/07/2014    3) Disussed with pt option to start IV velcade injection to which Pt opted to wait and not receive at this time  PLAN -Continue f/u with PCP (blood sugars) -labs steady as of today   RTC with Dr. Irene Limbo in 2 months. Earlier about 2 weeks after starting Ninlaro for a toxicity check.   All of the patients questions were answered with apparent satisfaction. The patient knows to call the clinic with any problems, questions or concerns.  I spent 15 minutes counseling the patient face to face. The total time spent in the appointment was 20 minutes and more than 50% was on counseling and direct patient cares.   Sullivan Lone MD [place] AAHIVMS [facility] Hematology/Oncology Physician [place]  (Office):       [phone] (Work cell):  [phone] (Fax):           [phone]   This document serves as a record of services personally performed by Sullivan Lone, MD. It was created on her behalf by Alean Rinne, a trained medical scribe. The creation of this record is based on the scribe's personal observations and the provider's statements to them. This document has been checked and approved by the attending provider.

## 2017-09-30 ENCOUNTER — Encounter: Payer: Self-pay | Admitting: Hematology

## 2017-09-30 ENCOUNTER — Telehealth: Payer: Self-pay

## 2017-09-30 ENCOUNTER — Ambulatory Visit (HOSPITAL_BASED_OUTPATIENT_CLINIC_OR_DEPARTMENT_OTHER): Payer: Medicare Other | Admitting: Hematology

## 2017-09-30 ENCOUNTER — Telehealth: Payer: Self-pay | Admitting: Hematology

## 2017-09-30 ENCOUNTER — Other Ambulatory Visit (HOSPITAL_BASED_OUTPATIENT_CLINIC_OR_DEPARTMENT_OTHER): Payer: Medicare Other

## 2017-09-30 VITALS — BP 143/61 | HR 69 | Temp 97.6°F | Resp 18 | Ht 69.0 in | Wt 160.9 lb

## 2017-09-30 DIAGNOSIS — Z8673 Personal history of transient ischemic attack (TIA), and cerebral infarction without residual deficits: Secondary | ICD-10-CM

## 2017-09-30 DIAGNOSIS — E538 Deficiency of other specified B group vitamins: Secondary | ICD-10-CM | POA: Diagnosis not present

## 2017-09-30 DIAGNOSIS — C9000 Multiple myeloma not having achieved remission: Secondary | ICD-10-CM

## 2017-09-30 LAB — CBC & DIFF AND RETIC
BASO%: 0.3 % (ref 0.0–2.0)
Basophils Absolute: 0 10*3/uL (ref 0.0–0.1)
EOS%: 2.3 % (ref 0.0–7.0)
Eosinophils Absolute: 0.1 10*3/uL (ref 0.0–0.5)
HCT: 33.1 % — ABNORMAL LOW (ref 38.4–49.9)
HGB: 10.4 g/dL — ABNORMAL LOW (ref 13.0–17.1)
Immature Retic Fract: 6.6 % (ref 3.00–10.60)
LYMPH%: 26.6 % (ref 14.0–49.0)
MCH: 29 pg (ref 27.2–33.4)
MCHC: 31.4 g/dL — AB (ref 32.0–36.0)
MCV: 92.2 fL (ref 79.3–98.0)
MONO#: 0.2 10*3/uL (ref 0.1–0.9)
MONO%: 4.9 % (ref 0.0–14.0)
NEUT%: 65.9 % (ref 39.0–75.0)
NEUTROS ABS: 2.3 10*3/uL (ref 1.5–6.5)
Platelets: 255 10*3/uL (ref 140–400)
RBC: 3.59 10*6/uL — ABNORMAL LOW (ref 4.20–5.82)
RDW: 17.2 % — AB (ref 11.0–14.6)
RETIC %: 0.7 % — AB (ref 0.80–1.80)
Retic Ct Abs: 25.13 10*3/uL — ABNORMAL LOW (ref 34.80–93.90)
WBC: 3.5 10*3/uL — AB (ref 4.0–10.3)
lymph#: 0.9 10*3/uL (ref 0.9–3.3)

## 2017-09-30 LAB — COMPREHENSIVE METABOLIC PANEL
ALT: <6 U/L (ref 0–55)
AST: 15 U/L (ref 5–34)
Albumin: 4 g/dL (ref 3.5–5.0)
Alkaline Phosphatase: 62 U/L (ref 40–150)
Anion Gap: 7 mEq/L (ref 3–11)
BUN: 23.6 mg/dL (ref 7.0–26.0)
CALCIUM: 9.9 mg/dL (ref 8.4–10.4)
CHLORIDE: 100 meq/L (ref 98–109)
CO2: 28 mEq/L (ref 22–29)
Creatinine: 1.2 mg/dL (ref 0.7–1.3)
EGFR: 61 mL/min/{1.73_m2} — ABNORMAL LOW (ref >90)
GLUCOSE: 102 mg/dL (ref 70–140)
POTASSIUM: 4.5 meq/L (ref 3.5–5.1)
SODIUM: 135 meq/L — AB (ref 136–145)
Total Bilirubin: 0.4 mg/dL (ref 0.20–1.20)
Total Protein: 9.9 g/dL — ABNORMAL HIGH (ref 6.4–8.3)

## 2017-09-30 NOTE — Progress Notes (Signed)
Carelink Summary Report / Loop Recorder 

## 2017-09-30 NOTE — Telephone Encounter (Signed)
Spoke with patient and verified appts.

## 2017-09-30 NOTE — Telephone Encounter (Signed)
Printed avs and calender with upcoming appointnment. Per 10/9 los

## 2017-10-02 LAB — CUP PACEART REMOTE DEVICE CHECK
Date Time Interrogation Session: 20181007093953
Implantable Pulse Generator Implant Date: 20170613

## 2017-10-21 MED FILL — NINLARO 4 MG CAP: 4 | 28 days supply | Qty: 3 | Fill #0

## 2017-10-21 MED FILL — ACYCLOVIR 400 MG TABLET: 400 | 30 days supply | Qty: 30 | Fill #0

## 2017-10-21 MED FILL — DEXAMETHASONE 4 MG TABLET: 4 | 28 days supply | Qty: 20 | Fill #0

## 2017-10-21 NOTE — Telephone Encounter (Signed)
Oral Oncology Patient Advocate Encounter  Received notification from [employer] that the previous prior authorization denial for Ninlaro had been successfully overturned.    The Ninlaro 4mg  capsules have been approved by the patient's insurance company until 09/30/2098 (lifetime approval).    I have spoken with Justin [name] and informed him of this decision. The medication will be available for pickup at the [facility] on 10/31 by 2pm. His copayment will be $28 monthly.    [name]. [name], [name] Patient [place] [phone] 10/21/2017 11:44 AM

## 2017-10-22 DIAGNOSIS — H43813 Vitreous degeneration, bilateral: Secondary | ICD-10-CM | POA: Diagnosis not present

## 2017-10-22 DIAGNOSIS — H524 Presbyopia: Secondary | ICD-10-CM | POA: Diagnosis not present

## 2017-10-22 DIAGNOSIS — E119 Type 2 diabetes mellitus without complications: Secondary | ICD-10-CM | POA: Diagnosis not present

## 2017-10-22 DIAGNOSIS — H2513 Age-related nuclear cataract, bilateral: Secondary | ICD-10-CM | POA: Diagnosis not present

## 2017-10-23 NOTE — Telephone Encounter (Signed)
Oral Oncology Patient Advocate Encounter  Confirmed with [place] that [name] picked up his prescriptions for Ninlaro, Dexamethasone and Acyclovir on 10/31.   [name]. [name], [place] Patient [place] [phone] 10/23/2017 11:55 AM

## 2017-10-24 ENCOUNTER — Telehealth: Payer: Self-pay | Admitting: Hematology

## 2017-10-24 NOTE — Telephone Encounter (Signed)
Scheduled appt per 10/30 sch msg - spoke with patient regarding appt.

## 2017-10-28 ENCOUNTER — Ambulatory Visit (INDEPENDENT_AMBULATORY_CARE_PROVIDER_SITE_OTHER): Payer: Medicare Other | Admitting: *Deleted

## 2017-10-28 DIAGNOSIS — I639 Cerebral infarction, unspecified: Secondary | ICD-10-CM | POA: Diagnosis not present

## 2017-10-28 NOTE — Progress Notes (Signed)
Carelink Summary Report / Loop Recorder 

## 2017-10-30 LAB — CUP PACEART REMOTE DEVICE CHECK
Date Time Interrogation Session: 20181106103909
MDC IDC PG IMPLANT DT: 20170613

## 2017-11-04 ENCOUNTER — Other Ambulatory Visit: Payer: Self-pay | Admitting: *Deleted

## 2017-11-04 DIAGNOSIS — C9000 Multiple myeloma not having achieved remission: Secondary | ICD-10-CM

## 2017-11-04 NOTE — Progress Notes (Signed)
[name] Kitchen    HEMATOLOGY/ONCOLOGY CLINIC NOTE  Date of Service: 11/05/17   Patient Care Team: Justin Neer, MD as PCP - General (Family Medicine)  CHIEF COMPLAINTS/PURPOSE OF CONSULTATION:   F/u for Plasma cell neoplasm/Multiple myeloma  HISTORY OF PRESENTING ILLNESS:   Justin [name] is a wonderful 76 y.o. 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  Patient has a history of hypertension, diabetes, GERD, gout, squamous cell skin cancers, elevated PSA, SVT status post ablation in May 2016, chronic back pain who has a history of recurrent CVA/TIA's. He apparently had a left hemispheric TIA June 2017 and has had recurrent) subcortical infarcts thought to be related to small vessel disease. He was noted to have a small PFO that was of questionable significance. He was initially on aspirin and then continued and aspirin + plavix for secondary stroke prevention. He follows with Dr. Antony [name] for his neurology cares.  An SPEP was done due to elevated total proteins of 8.9 and showed an M spike of 2 g/dL. He was also noted to have an elevated total protein of 8.6 in April 2017. He was referred to [place] for further evaluation of his M spike. Blood test did not reveal any hypercalcemia, no significant renal failure. He has been noted to have developed anemia since June the serum and his hemoglobin was 12.6 and is now down to the mid 10 range. He notes no focal bone pains at this time. Overt acute weight loss. No fevers no chills no night sweats.  INTERVAL HISTORY  Pt presents to the office today for follow-up of his myeloma. He reports that he is doing well overall. He reports that he started on Ninlaro and will start his 3rd dose tomorrow (Cycle 1, Day 15). He is also taking dexamethasone and acyclovir with it. He denies any issues with taking these medications. Hemoglobin at 10.9 today, which is increased from 10.4 on 09/30/2017. 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   On review of systems, he reports decreased bowel movements. He denies nausea or diarrhea. He denies numbness or tingling in his hands or feet. He denies a change in his eating habits. He denies a change in his sleep habits.      MEDICAL HISTORY:  Past Medical History:  Diagnosis Date  . Anemia   . Arthritis   . 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 Diabetes mellitus without complication ([place])   . GERD (gastroesophageal reflux disease)   . Gout   . History of loop recorder   . Hypertension   . Left leg weakness   . Paroxysmal SVT (supraventricular tachycardia) ([place])    a. s/p RFCA on 05/01/15  . Stroke [facility])    TIA and mild stroke    SURGICAL HISTORY: Past Surgical History:  Procedure Laterality Date  . ATRIAL FIBRILLATION ABLATION     Dr. Lovena [name]  . BASAL CELL CARCINOMA EXCISION     off of back  . SQUAMOUS CELL CARCINOMA EXCISION      SOCIAL HISTORY: Social History   Socioeconomic History  . Marital status: Married    Spouse name: Not on file  . Number of children: Not on file  . Years of education: Not on file  . Highest education level: Not on file  Social Needs  . Financial resource strain:  Not on file  . Food insecurity - worry: Not on file  . Food insecurity - inability: Not on file  . Transportation needs - medical: Not on file  . Transportation needs - non-medical: Not on file  Occupational History  . Not on file  Tobacco Use  . Smoking status: Never Smoker  . Smokeless tobacco: Never Used  Substance and Sexual Activity  . Alcohol use: No  . Drug use: No  . Sexual activity: Not on file  Other Topics Concern  . Not on file  Social History Narrative  . Not on file    FAMILY HISTORY: Family History  Problem Relation Age of Onset  . Stroke Mother   . Hypertension Mother   . Alzheimer's disease Father   . Heart  failure Father   . Down syndrome Daughter     ALLERGIES:  is allergic to no known allergies.  MEDICATIONS:  Current Outpatient Medications  Medication Sig Dispense Refill  . acyclovir (ZOVIRAX) 400 MG tablet Take 1 tablet (400 mg total) by mouth daily. 30 tablet 2  . allopurinol (ZYLOPRIM) 300 MG tablet Take 300 mg by mouth daily.     [name] Kitchen aspirin 325 MG tablet Take 325 mg by mouth daily.    [name] Kitchen atorvastatin (LIPITOR) 40 MG tablet Take 1 tablet (40 mg total) by mouth every morning. 90 tablet 3  . cyanocobalamin 1000 MCG tablet Take 1,000 mcg by mouth daily.    [name] Kitchen dexamethasone (DECADRON) 4 MG tablet Take 5 tablets ('20mg'$ ) by mouth once weekly. Take on days 1, 8, 15, and 21 of every 28day cycle 20 tablet 2  . fluorouracil (EFUDEX) 5 % cream Apply 1 application topically daily as needed (for skin cancer prevention).   0  . glucose monitoring kit (FREESTYLE) monitoring kit 1 each by Does not apply route as needed for other.    . ixazomib citrate (NINLARO) 4 MG capsule Take 1 capsule by mouth once weekly on days 1, 8, and 15, of each 28 day cycle. Take on an empty stomach 1hr before or 2hrs after food. 3 capsule 2  . metFORMIN (GLUCOPHAGE) 1000 MG tablet Take 1,000 mg by mouth daily with breakfast.    . nitroGLYCERIN (NITROSTAT) 0.4 MG SL tablet Place 1 tablet (0.4 mg total) under the tongue every 5 (five) minutes as needed for chest pain. 25 tablet 3  . omeprazole (PRILOSEC) 20 MG capsule Take 20 mg by mouth every morning.     [name] Kitchen telmisartan-hydrochlorothiazide (MICARDIS HCT) 80-12.5 MG tablet Take 1 tablet by mouth daily. 90 tablet 3   No current facility-administered medications for this visit.     REVIEW OF SYSTEMS:   10 Point review of Systems was done is negative except as noted above.  PHYSICAL EXAMINATION:  ECOG PERFORMANCE STATUS: 2 - Symptomatic, <50% confined to bed  . Vitals:   11/05/17 1325  BP: 129/70  Pulse: 60  Resp: 16  Temp: (!) 97.5 F (36.4 C)  SpO2: 100%   Filed  Weights   11/05/17 1325  Weight: 162 lb 11.2 oz (73.8 kg)   Body mass index is 24.03 kg/m.  GENERAL:alert, in no acute distress and comfortable SKIN: skin color, texture, turgor are normal, no rashes or significant lesions EYES: normal, conjunctiva are pink and non-injected, sclera clear OROPHARYNX:no exudate, no erythema and lips, buccal mucosa, and tongue normal  NECK: supple, no JVD, thyroid normal size, non-tender, without nodularity LYMPH:  no palpable lymphadenopathy in the cervical, axillary or inguinal LUNGS:  clear to auscultation with normal respiratory effort HEART: regular rate & rhythm,  no murmurs and no lower extremity edema ABDOMEN: abdomen soft, non-tender, normoactive bowel sounds  Musculoskeletal: no cyanosis of digits and no clubbing  PSYCH: alert & oriented x 3 with fluent speech NEURO: no focal motor/sensory deficits  LABORATORY DATA:  I have reviewed the data as listed. CBC Latest Ref Rng & Units 11/05/2017 09/30/2017 08/26/2017  WBC 4.0 - 10.3 10e3/uL 3.2(L) 3.5(L) 3.0(L)  Hemoglobin 13.0 - 17.1 g/dL 10.9(L) 10.4(L) 10.0(L)  Hematocrit 38.4 - 49.9 % 33.3(L) 33.1(L) 29.4(L)  Platelets 140 - 400 10e3/uL 157 255 128(L)    CMP Latest Ref Rng & Units 11/05/2017 09/30/2017 08/26/2017  Glucose 70 - 140 mg/dl 109 102 107  BUN 7.0 - 26.0 mg/dL 20.6 23.6 26.2(H)  Creatinine 0.7 - 1.3 mg/dL 1.1 1.2 1.2  Sodium 136 - 145 mEq/L 138 135(L) 137  Potassium 3.5 - 5.1 mEq/L 4.2 4.5 3.8  Chloride 101 - 111 mmol/L - - -  CO2 22 - 29 mEq/L _0 Calcium 8.4 - 10.4 mg/dL 9.6 9.9 9.7  Total Protein 6.4 - 8.3 g/dL 9.6(H) 9.9(H) 9.4(H)  Total Bilirubin 0.20 - 1.20 mg/dL 0.51 0.40 0.58  Alkaline Phos 40 - 150 U/L 55 62 66  AST 5 - 34 U/L _1 ALT 0 - 55 U/L 6 <6 <6   . Component     Latest Ref Rng & Units 08/26/2017  LDH     125 - 245 U/L 125  Beta 2     0.6 - 2.4 mg/L 2.9 (H)  Ferritin     22 - 316 ng/ml 308  Vitamin B12     232 - 1,245 pg/mL 483       Lab  Results  Component Value Date   IRON 45 06/02/2017   TIBC 233 06/02/2017   IRONPCTSAT 19 (L) 06/02/2017   (Iron and TIBC)  Lab Results  Component Value Date   FERRITIN 308 08/26/2017           RADIOGRAPHIC STUDIES: I have personally reviewed the radiological images as listed and agreed with the findings in the report.  Bone Marrow Biopsy 12/24/2016 (Accession [id number])  Diagnosis Bone Marrow, Aspirate,Biopsy, and Clot, left iliac crest - HYPERCELLULAR BONE MARROW FOR AGE WITH PLASMA CELL NEOPLASM. - SEE COMMENT. PERIPHERAL BLOOD: - NORMOCYTIC-NORMOCHROMIC ANEMIA. - LEUKOPENIA.  DG Bone Survey Met (Accession [id number]) (Order [id number])  Imaging  Date: 11/28/2016 Department: [facility]-RAD[facility]OLOG[facility]ST[facility] Released By: Pricilla Riffle Authorizing: Brunetta Genera, MD  Exam Information   Status Exam Begun  Exam Ended   Final [99] 11/28/2016 11:59 AM 11/28/2016 12:36 PM  PACS Images   Show images for DG Bone Survey Met  Study Result   CLINICAL DATA:  Monoclonal gammopathy of unknown significance. History of squamous cell carcinoma excision, basal cell carcinoma excision.  EXAM: METASTATIC BONE SURVEY  COMPARISON:  CT neck, chest, abdomen and pelvis from 10/29/16, MRI of the head from 06/01/2016, CXR 10/27/2016, lumbar spine radiographs 06/24/2016  FINDINGS: Lateral skull: Small occipital lucency which may represent a normal arachnoid granulation posteriorly in the occiput. No definite lytic abnormality.  Cervical spine AP and lateral: Disc space narrowing at C2-3, and from C4 through C7. C4-5, C5-6 and C6-7 uncovertebral joint spurring bilaterally. No lytic abnormality.  Thoracic spine AP and lateral: T8-9 right-sided osteophytes and to a lesser degree T9-10. No lytic abnormality. Slight multilevel lumbar thoracic disc space narrowing likely  degenerative.  Lumbar spine AP and lateral: Disc space narrowing at L4-5 and to  a greater extent L5-S1. No lytic disease. L3 through S1 facet sclerosis.  AP pelvis: Negative for lytic disease.  Bilateral upper and lower extremities shoulders through wrist and from the hips through ankle: Negative for lytic disease. Joint space narrowing of the femorotibial compartments both knees. Subchondral cyst of the right patella.  CXR: Clear lungs. Cardiac implantable monitoring device projects over the left heart. Aortic atherosclerosis. No lytic disease.  IMPRESSION: No findings suspicious for lytic disease.   Electronically Signed   By: Ashley Royalty M.D.   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 (12/2016) CYtogenetics and Myeloma FISH panel.- trisomy 11 Patient has normocytic anemia without any other clear etiology. (>2g/dl lower that lower limit of normal which is 13) which per criteria would place this in the Multiple myeloma category as opposed to Smoldering Multiple myeloma (Borderline criterion)  No overt renal failure or hypercalcemia at this time No focal bone pains though he has significant chronic back pain related to degenerative disc disease. Bone survey shows no overt lytic lesions. Myeloma panel M spike increasing gradually and is up to 2.6g/dl with increasing Lambda FLC - Hemoglobin slowly worsening, 10.9 today. IgG and M-protein both shows slow progression of myeloma  2) Borderline low B12 levels 299 --- on replacement - now improved to 482 -continue replacement.  PLAN: -Labs steady. Hemoglobin is at 10.9. - No new hypercalcemia or renal failure or new bone pains. --He is tolerating Ninlaro, dexamethasone, and acyclovir without any acute complications.  -Discussed with patient regarding increasing dose of dexamethasone from 20 mg to 40 mg once a week. Patient reported that he would like to wait until  his next round to make a decision regarding this. -patient is on C1D15 as of tomorrow, 11/06/2017 on Ninlaro.  SFLC show early response. -conitnue Ninlaro + steroids.  2) recurrent CVA - thought to be related to small vessel disease as per neurology. Has a small PFO which could be an additional risk factor. -on ASA as per neurology (Dr Leonie Man)  3) Patient Active Problem List   Diagnosis Date Noted  . History of loop recorder 06/12/2017  . Cryptogenic stroke ([place]) 10/28/2016  . Gait abnormality   . History of CVA (cerebrovascular accident)   . History of TIA (transient ischemic attack)   . Benign essential HTN   . Paroxysmal SVT (supraventricular tachycardia) ([place])   . Acute blood loss anemia   . Acute ischemic stroke ([place])   . CVA (cerebral vascular accident) ([place]) 10/26/2016  . History of recent stroke 06/06/2016  . 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 TIA (transient ischemic attack) 06/02/2016  . Numbness 06/01/2016  . Right arm numbness 06/01/2016  . Atherosclerosis of native coronary artery of native heart without angina pectoris 03/20/2016  . Hypertension   . GERD (gastroesophageal reflux disease)   . Gout   . S/P RF ablation operation for arrhythmia 12/13/2014  . Hyperlipidemia 12/13/2014  . Controlled type 2 diabetes mellitus with complication, without long-term current use of insulin ([place]) 12/07/2014    3) Disussed with pt option to start IV velcade injection to which pt opted to wait and not receive at this time  PLAN -Continue f/u with PCP (blood sugars) -labs steady as of today  Labs in 4 weeks (1 week prior to clinic visit) RTC with Dr Irene Limbo in 5 weeks 12/20   All of the patients questions were answered with apparent satisfaction. The patient knows to call the clinic with any problems, questions or concerns.  I spent 20 minutes counseling the patient face to face. The total time spent in the appointment was 25 minutes and more than 50% was on  counseling and direct patient cares.   Sullivan Lone MD [place] AAHIVMS [facility] Hematology/Oncology Physician [place]  (Office):       [phone] (Work cell):  [phone] (Fax):           [phone]   This document serves as a record of services personally performed by Sullivan Lone, MD. It was created on his behalf by Steva Colder, a trained medical scribe. The creation of this record is based on the scribe's personal observations and the provider's statements to them.   .I have reviewed the above documentation for accuracy and completeness, and I agree with the above.  Brunetta Genera MD

## 2017-11-05 ENCOUNTER — Other Ambulatory Visit (HOSPITAL_BASED_OUTPATIENT_CLINIC_OR_DEPARTMENT_OTHER): Payer: Medicare Other

## 2017-11-05 ENCOUNTER — Ambulatory Visit (HOSPITAL_BASED_OUTPATIENT_CLINIC_OR_DEPARTMENT_OTHER): Payer: Medicare Other | Admitting: Hematology

## 2017-11-05 ENCOUNTER — Telehealth: Payer: Self-pay | Admitting: Hematology

## 2017-11-05 ENCOUNTER — Encounter: Payer: Self-pay | Admitting: Hematology

## 2017-11-05 VITALS — BP 129/70 | HR 60 | Temp 97.5°F | Resp 16 | Ht 69.0 in | Wt 162.7 lb

## 2017-11-05 DIAGNOSIS — E538 Deficiency of other specified B group vitamins: Secondary | ICD-10-CM | POA: Diagnosis not present

## 2017-11-05 DIAGNOSIS — C9000 Multiple myeloma not having achieved remission: Secondary | ICD-10-CM | POA: Diagnosis not present

## 2017-11-05 DIAGNOSIS — Z8673 Personal history of transient ischemic attack (TIA), and cerebral infarction without residual deficits: Secondary | ICD-10-CM | POA: Diagnosis not present

## 2017-11-05 LAB — COMPREHENSIVE METABOLIC PANEL
ALT: 6 U/L (ref 0–55)
AST: 16 U/L (ref 5–34)
Albumin: 4.4 g/dL (ref 3.5–5.0)
Alkaline Phosphatase: 55 U/L (ref 40–150)
Anion Gap: 7 mEq/L (ref 3–11)
BILIRUBIN TOTAL: 0.51 mg/dL (ref 0.20–1.20)
BUN: 20.6 mg/dL (ref 7.0–26.0)
CO2: 27 meq/L (ref 22–29)
Calcium: 9.6 mg/dL (ref 8.4–10.4)
Chloride: 104 mEq/L (ref 98–109)
Creatinine: 1.1 mg/dL (ref 0.7–1.3)
GLUCOSE: 109 mg/dL (ref 70–140)
POTASSIUM: 4.2 meq/L (ref 3.5–5.1)
SODIUM: 138 meq/L (ref 136–145)
TOTAL PROTEIN: 9.6 g/dL — AB (ref 6.4–8.3)

## 2017-11-05 LAB — CBC WITH DIFFERENTIAL/PLATELET
BASO%: 0.6 % (ref 0.0–2.0)
Basophils Absolute: 0 10*3/uL (ref 0.0–0.1)
EOS ABS: 0.1 10*3/uL (ref 0.0–0.5)
EOS%: 1.6 % (ref 0.0–7.0)
HCT: 33.3 % — ABNORMAL LOW (ref 38.4–49.9)
HGB: 10.9 g/dL — ABNORMAL LOW (ref 13.0–17.1)
LYMPH#: 0.7 10*3/uL — AB (ref 0.9–3.3)
LYMPH%: 22.8 % (ref 14.0–49.0)
MCH: 29.9 pg (ref 27.2–33.4)
MCHC: 32.6 g/dL (ref 32.0–36.0)
MCV: 91.8 fL (ref 79.3–98.0)
MONO#: 0.2 10*3/uL (ref 0.1–0.9)
MONO%: 5.7 % (ref 0.0–14.0)
NEUT%: 69.3 % (ref 39.0–75.0)
NEUTROS ABS: 2.2 10*3/uL (ref 1.5–6.5)
Platelets: 157 10*3/uL (ref 140–400)
RBC: 3.63 10*6/uL — AB (ref 4.20–5.82)
RDW: 20.1 % — AB (ref 11.0–14.6)
WBC: 3.2 10*3/uL — AB (ref 4.0–10.3)

## 2017-11-05 NOTE — Telephone Encounter (Signed)
Gave avs and calendar for December  °

## 2017-11-05 NOTE — Patient Instructions (Signed)
Thank you for choosing [facility] to provide your oncology and hematology care.  To afford each patient quality time with our providers, please arrive 30 minutes before your scheduled appointment time.  If you arrive late for your appointment, you may be asked to reschedule.  We strive to give you quality time with our providers, and arriving late affects you and other patients whose appointments are after yours.   If you are a no show for multiple scheduled visits, you may be dismissed from the clinic at the providers discretion.    Again, thank you for choosing [facility], our hope is that these requests will decrease the amount of time that you wait before being seen by our physicians.  ______________________________________________________________________  Should you have questions after your visit to the [facility], please contact our office at [phone] between the hours of 8:30 and 4:30 p.m.    Voicemails left after 4:30p.m will not be returned until the following business day.    For prescription refill requests, please have your pharmacy contact us directly.  Please also try to allow 48 hours for prescription requests.    Please contact the scheduling department for questions regarding scheduling.  For scheduling of procedures such as PET scans, CT scans, MRI, Ultrasound, etc please contact central scheduling at [phone].    Resources For Cancer Patients and Caregivers:   Oncolink.org:  A wonderful resource for patients and healthcare providers for information regarding your disease, ways to tract your treatment, what to expect, etc.     American Cancer Society:  800-227-2345  Can help patients locate various types of support and financial assistance  Cancer Care: 1-800-813-HOPE (4673) Provides financial assistance, online support groups, medication/co-pay assistance.    [place] DSS:  336-641-3447 Where to apply for food  stamps, Medicaid, and utility assistance  Medicare Rights Center: 800-333-4114 Helps people with Medicare understand their rights and benefits, navigate the Medicare system, and secure the quality healthcare they deserve  SCAT: 336-333-6589 [employer] [employer]'s shared-ride transportation service for eligible riders who have a disability that prevents them from riding the fixed route bus.    For additional information on assistance programs please contact our social worker:   Grier Hock/Abigail Elmore:  [phone]            

## 2017-11-06 LAB — KAPPA/LAMBDA LIGHT CHAINS
IG KAPPA FREE LIGHT CHAIN: 15.2 mg/L (ref 3.3–19.4)
Ig Lambda Free Light Chain: 193.1 mg/L — ABNORMAL HIGH (ref 5.7–26.3)
Kappa/Lambda FluidC Ratio: 0.08 — ABNORMAL LOW (ref 0.26–1.65)

## 2017-11-10 LAB — MULTIPLE MYELOMA PANEL, SERUM
ALBUMIN SERPL ELPH-MCNC: 4 g/dL (ref 2.9–4.4)
ALPHA 1: 0.2 g/dL (ref 0.0–0.4)
Albumin/Glob SerPl: 0.9 (ref 0.7–1.7)
Alpha2 Glob SerPl Elph-Mcnc: 0.7 g/dL (ref 0.4–1.0)
B-Globulin SerPl Elph-Mcnc: 0.9 g/dL (ref 0.7–1.3)
GAMMA GLOB SERPL ELPH-MCNC: 2.9 g/dL — AB (ref 0.4–1.8)
Globulin, Total: 4.8 g/dL — ABNORMAL HIGH (ref 2.2–3.9)
IGA/IMMUNOGLOBULIN A, SERUM: 52 mg/dL — AB (ref 61–437)
IGM (IMMUNOGLOBIN M), SRM: 29 mg/dL (ref 15–143)
IgG, Qn, Serum: 3333 mg/dL — ABNORMAL HIGH (ref 700–1600)
M Protein SerPl Elph-Mcnc: 2.5 g/dL — ABNORMAL HIGH
Total Protein: 8.8 g/dL — ABNORMAL HIGH (ref 6.0–8.5)

## 2017-11-12 ENCOUNTER — Other Ambulatory Visit: Payer: Self-pay

## 2017-11-12 ENCOUNTER — Telehealth: Payer: Self-pay

## 2017-11-12 DIAGNOSIS — C9000 Multiple myeloma not having achieved remission: Secondary | ICD-10-CM

## 2017-11-12 MED FILL — NINLARO 4 MG CAP: 4 | 28 days supply | Qty: 3 | Fill #1

## 2017-11-12 NOTE — Telephone Encounter (Signed)
Justin [name] has been reordered. Confirm with [facility]. Called pt to notify. He is already aware and planning to p/u prescription on Monday (11/26).

## 2017-11-14 MED FILL — ACYCLOVIR 400 MG TABLET: 400 | 30 days supply | Qty: 30 | Fill #1

## 2017-11-14 MED FILL — DEXAMETHASONE 4 MG TABLET: 4 | 28 days supply | Qty: 20 | Fill #1

## 2017-11-20 ENCOUNTER — Telehealth: Payer: Self-pay | Admitting: Hematology

## 2017-11-20 NOTE — Telephone Encounter (Signed)
[place] [facility] - cxd 12/4 appointments. Patient last seen 11/14 and per 11/14 los is to return in 5 weeks for f/u with lab I week before. Patient already on schedule for 12/12 and 12/19. Spoke with patient he is aware.

## 2017-11-24 ENCOUNTER — Telehealth: Payer: Self-pay | Admitting: *Deleted

## 2017-11-24 MED ORDER — RIVAROXABAN 20 MG PO TABS: 20.0000 mg | ORAL_TABLET | Freq: Every day | ORAL | 5 refills | Status: DC

## 2017-11-24 NOTE — Telephone Encounter (Signed)
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 All episodes on 11/22/17, total duration 18 minutes, Max V rate 154 bpm. Reviewed episodes with Dr. Allred, recommends to STOP Aspirin 325 mg and START Xarelto 20 mg daily and Follow-up with EP PA on 12/05/17.  Patient states he is concerned to start medication while on Ninlaro for treatment of multiple myeloma. Advised patient I will send Dr. Allred a message regarding patient's concern and call back with further recommendations.  

## 2017-11-25 ENCOUNTER — Other Ambulatory Visit: Payer: Medicare Other

## 2017-11-25 ENCOUNTER — Ambulatory Visit: Payer: Medicare Other | Admitting: Hematology

## 2017-11-25 NOTE — Telephone Encounter (Signed)
Will need to reach out to Dr Irene Limbo to see if he is ok with stopping ASA and starting Xarelto prior to initiation.  Thompson Grayer MD, [facility] 11/25/2017 11:40 AM

## 2017-11-27 ENCOUNTER — Ambulatory Visit (INDEPENDENT_AMBULATORY_CARE_PROVIDER_SITE_OTHER): Payer: Medicare Other | Admitting: *Deleted

## 2017-11-27 DIAGNOSIS — I639 Cerebral infarction, unspecified: Secondary | ICD-10-CM

## 2017-11-27 NOTE — Progress Notes (Signed)
Carelink Summary Report / Loop Recorder 

## 2017-11-30 NOTE — Progress Notes (Signed)
Cardiology Office Note Date:  12/04/2017  Patient ID:  Justin [name] Aug 16, 1941, MRN [id number] PCP:  Mayra Neer, MD  Cardiologist:  Dr. Debara Pickett Electrophysiologist; Dr. Rayann Heman    Chief Complaint:  AF noted on ILR transmission  History of Present Illness: Justin [name] is a 76 y.o. male with history of SVT, DM, HLD, CVA/TIA w/u noted small bidirectional PFO by TEE, no thrombus, no DVT and an ILR implanted June 2017, most recently Dr. 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  He comes in today to be seen for Dr. Lovena Le.  He was found on an ILR transmission to have AF.  Device RN reviewed with Dr. Rayann Heman who recommended stopping ASA and starting Xarelto, though trhe patient rasied concerns with taking the [facility] being on Ninlaro for his myeloma, Dr. Rayann Heman recommended he f/u with Dr. Irene Limbo, or to reach out to Dr. Irene Limbo to clear plans first.  He is feeling well, no real cardiac awareness at all, though since he was called last week seems to feel like he is paying more attention and has felt fleetingly a vague "something", unable to describe further.  No CP or SOB, no dizziness, near syncope or syncope.  Says all-in-all he feels pretty healthy and good.  He does no formal or regular exercise but says he stays active and able to do his ADL without difficulty.  Device information: MDT ILR, implanted 06/04/16, cryptogenic stroke   Past Medical History:  Diagnosis Date  . Anemia   . Arthritis   . 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 Diabetes mellitus without complication ([place])   . GERD (gastroesophageal reflux disease)   . Gout   . History of loop recorder   . Hypertension   . Left leg weakness   . Paroxysmal SVT (supraventricular tachycardia) ([place])    a. s/p RFCA on 05/01/15  . Stroke [facility])    TIA and mild stroke    Past Surgical History:  Procedure Laterality Date  . ATRIAL FIBRILLATION  ABLATION     Dr. Lovena Le  . BASAL CELL CARCINOMA EXCISION     off of back  . ELECTROPHYSIOLOGIC STUDY N/A 05/01/2015   Procedure: SVT Ablation;  Surgeon: Evans Lance, MD;  Location: [place] [facility];  Service: Cardiovascular;  Laterality: N/A;  . EP IMPLANTABLE DEVICE N/A 06/04/2016   Procedure: Loop Recorder Insertion;  Surgeon: Thompson Grayer, MD;  Location: [place] [facility];  Service: Cardiovascular;  Laterality: N/A;  . INGUINAL HERNIA REPAIR Bilateral 06/16/2017   Procedure: LAPAROSCOPIC BILATERAL INGUINAL HERNIA REPAIR;  Surgeon: Clovis Riley, MD;  Location: [place];  Service: General;  Laterality: Bilateral;  . INSERTION OF MESH Bilateral 06/16/2017   Procedure: INSERTION OF MESH;  Surgeon: Clovis Riley, MD;  Location: [place];  Service: General;  Laterality: Bilateral;  . SQUAMOUS CELL CARCINOMA EXCISION    . TEE WITHOUT CARDIOVERSION N/A 06/04/2016   Procedure: TRANSESOPHAGEAL ECHOCARDIOGRAM (TEE);  Surgeon: Pixie Casino, MD;  Location: [facility] ENDOSCOPY;  Service: Cardiovascular;  Laterality: N/A;    Current Outpatient Medications  Medication Sig Dispense Refill  . acyclovir (ZOVIRAX) 400 MG tablet Take 1 tablet (400 mg total) by mouth daily. 30 tablet 2  . allopurinol (ZYLOPRIM) 300 MG tablet Take 300 mg by mouth daily.     [name] Kitchen atorvastatin (LIPITOR) 40 MG tablet Take 1 tablet (40 mg total) by mouth every  morning. 90 tablet 3  . cyanocobalamin 1000 MCG tablet Take 1,000 mcg by mouth daily.    [name] Kitchen dexamethasone (DECADRON) 4 MG tablet Take 5 tablets (40m) by mouth once weekly. Take on days 1, 8, 15, and 21 of every 28day cycle 20 tablet 2  . fluorouracil (EFUDEX) 5 % cream Apply 1 application topically daily as needed (for skin cancer prevention).   0  . glucose monitoring kit (FREESTYLE) monitoring kit 1 each by Does not apply route as needed for other.    . ixazomib citrate (NINLARO) 4 MG capsule Take 1 capsule by mouth once weekly on days 1, 8, and 15, of each 28 day cycle. Take  on an empty stomach 1hr before or 2hrs after food. 3 capsule 2  . metFORMIN (GLUCOPHAGE) 1000 MG tablet Take 1,000 mg by mouth daily with breakfast.    . nitroGLYCERIN (NITROSTAT) 0.4 MG SL tablet Place 1 tablet (0.4 mg total) under the tongue every 5 (five) minutes as needed for chest pain. 25 tablet 3  . omeprazole (PRILOSEC) 20 MG capsule Take 20 mg by mouth every morning.     . rivaroxaban (XARELTO) 20 MG TABS tablet Take 1 tablet (20 mg total) by mouth daily with supper. 30 tablet 5  . telmisartan-hydrochlorothiazide (MICARDIS HCT) 80-12.5 MG tablet Take 1 tablet by mouth daily. 90 tablet 3   No current facility-administered medications for this visit.     Allergies:   No known allergies   Social History:  The patient  reports that  has never smoked. he has never used smokeless tobacco. He reports that he does not drink alcohol or use drugs.   Family History:  The patient's family history includes Alzheimer's disease in his father; Down syndrome in his daughter; Heart failure in his father; Hypertension in his mother; Stroke in his mother.  ROS:  Please see the history of present illness.  All other systems are reviewed and otherwise negative.   PHYSICAL EXAM:  VS:  BP (!) 150/84   Pulse 88   Ht 5' 8"  (1.727 m)   Wt 169 lb (76.7 kg)   BMI 25.70 kg/m  BMI: Body mass index is 25.7 kg/m. Well nourished, well developed, in no acute distress  HEENT: normocephalic, atraumatic  Neck: no JVD, carotid bruits or masses Cardiac:  RRR; no significant murmurs, no rubs, or gallops Lungs:  CTA b/l, no wheezing, rhonchi or rales  Abd: soft, nontender MS: no deformity or atrophy Ext: no edema  Skin: warm and dry, no rash Neuro:  No gross deficits appreciated Psych: euthymic mood, full affect  ILR site is stable, no tethering or discomfort   EKG:  Not done today ILR interrogation done today and reviewed by myself: battery is good, 5 total episodes of AF, Dec 1st, 4 episodes, longest 12  minutes, and Dec 8th 5 minutes  10/28/17: TTE Study Conclusions - Left ventricle: The cavity size was normal. Wall thickness was   normal. Systolic function was normal. The estimated ejection   fraction was in the range of 60% to 65%. Wall motion was normal;   there were no regional wall motion abnormalities. Doppler   parameters are consistent with abnormal left ventricular   relaxation (grade 1 diastolic dysfunction). - Mitral valve: Calcified annulus. Impressions: - No cardiac source of emboli was indentified.   Recent Labs: 12/03/2017: ALT 10; BUN 20.4; Creatinine 1.1; HGB 11.5; Platelets 89; Potassium 3.9; Sodium 136  No results found for requested labs within last 8760  hours.   Estimated Creatinine Clearance: 54.4 mL/min (by C-G formula based on SCr of 1.1 mg/dL).   Wt Readings from Last 3 Encounters:  12/04/17 169 lb (76.7 kg)  11/05/17 162 lb 11.2 oz (73.8 kg)  09/30/17 160 lb 14.4 oz (73 kg)     Other studies reviewed: Additional studies/records reviewed today include: summarized above  ASSESSMENT AND PLAN:  1. Paroxysmal Afib     CHA2DS2Vasc is 5     Discussed rational for anticoagulation, risks/benefits of [place], he reports understanding and is agreeable     I reached out to Dr. Grier Mitts office, Mackie Pai, RN, reports Dr. Irene Limbo reviewed chart and had no objection to xarelto     Creat Cl based on yesterday's lab is 26, Xarelto appropriately dosed at 83m daily  Monitor burden prior to consideration of medication changes/additions  The patient states that his 1st dose of Ninlaro was the 1st and the 8th, inquires if this could contribute to the AF, he sees Dr. KIrene Limbonext week and will discuss with him this.    2. HTN     The patient reports today as very unusual for him     No changes for now    Disposition: F/u with [facility] 3 months given new to a/c, sooner if needed.  Current medicines are reviewed at length with the patient today.  The patient did not have any  concerns regarding medicines.  SVenetia Night PA-C 12/04/2017 2:16 PM     [place] [phone](office)  ([phone](fax)

## 2017-12-03 ENCOUNTER — Other Ambulatory Visit (HOSPITAL_BASED_OUTPATIENT_CLINIC_OR_DEPARTMENT_OTHER): Payer: Medicare Other

## 2017-12-03 DIAGNOSIS — C9000 Multiple myeloma not having achieved remission: Secondary | ICD-10-CM | POA: Diagnosis not present

## 2017-12-03 LAB — CBC & DIFF AND RETIC
BASO%: 0.4 % (ref 0.0–2.0)
Basophils Absolute: 0 10*3/uL (ref 0.0–0.1)
EOS ABS: 0.1 10*3/uL (ref 0.0–0.5)
EOS%: 2 % (ref 0.0–7.0)
HCT: 36.4 % — ABNORMAL LOW (ref 38.4–49.9)
HGB: 11.5 g/dL — ABNORMAL LOW (ref 13.0–17.1)
IMMATURE RETIC FRACT: 6.5 % (ref 3.00–10.60)
LYMPH%: 34.3 % (ref 14.0–49.0)
MCH: 29.3 pg (ref 27.2–33.4)
MCHC: 31.6 g/dL — ABNORMAL LOW (ref 32.0–36.0)
MCV: 92.9 fL (ref 79.3–98.0)
MONO#: 0.2 10*3/uL (ref 0.1–0.9)
MONO%: 9.4 % (ref 0.0–14.0)
NEUT%: 53.9 % (ref 39.0–75.0)
NEUTROS ABS: 1.4 10*3/uL — AB (ref 1.5–6.5)
NRBC: 0 % (ref 0–0)
Platelets: 89 10*3/uL — ABNORMAL LOW (ref 140–400)
RBC: 3.92 10*6/uL — AB (ref 4.20–5.82)
RDW: 17.9 % — AB (ref 11.0–14.6)
RETIC CT ABS: 26.26 10*3/uL — AB (ref 34.80–93.90)
Retic %: 0.67 % — ABNORMAL LOW (ref 0.80–1.80)
WBC: 2.5 10*3/uL — AB (ref 4.0–10.3)
lymph#: 0.9 10*3/uL (ref 0.9–3.3)

## 2017-12-03 LAB — COMPREHENSIVE METABOLIC PANEL
ALT: 10 U/L (ref 0–55)
ANION GAP: 9 meq/L (ref 3–11)
AST: 20 U/L (ref 5–34)
Albumin: 4.4 g/dL (ref 3.5–5.0)
Alkaline Phosphatase: 62 U/L (ref 40–150)
BUN: 20.4 mg/dL (ref 7.0–26.0)
CALCIUM: 9.3 mg/dL (ref 8.4–10.4)
CHLORIDE: 102 meq/L (ref 98–109)
CO2: 25 mEq/L (ref 22–29)
Creatinine: 1.1 mg/dL (ref 0.7–1.3)
EGFR: >60 mL/min/{1.73_m2} (ref >60)
Glucose: 108 mg/dl (ref 70–140)
POTASSIUM: 3.9 meq/L (ref 3.5–5.1)
Sodium: 136 mEq/L (ref 136–145)
Total Bilirubin: 0.47 mg/dL (ref 0.20–1.20)
Total Protein: 9 g/dL — ABNORMAL HIGH (ref 6.4–8.3)

## 2017-12-04 ENCOUNTER — Telehealth: Payer: Self-pay | Admitting: *Deleted

## 2017-12-04 ENCOUNTER — Ambulatory Visit (INDEPENDENT_AMBULATORY_CARE_PROVIDER_SITE_OTHER): Payer: Medicare Other | Admitting: Physician Assistant

## 2017-12-04 ENCOUNTER — Other Ambulatory Visit: Payer: Self-pay | Admitting: Internal Medicine

## 2017-12-04 VITALS — BP 150/84 | HR 88 | Ht 68.0 in | Wt 169.0 lb

## 2017-12-04 DIAGNOSIS — I10 Essential (primary) hypertension: Secondary | ICD-10-CM

## 2017-12-04 DIAGNOSIS — I639 Cerebral infarction, unspecified: Secondary | ICD-10-CM

## 2017-12-04 DIAGNOSIS — I471 Supraventricular tachycardia: Secondary | ICD-10-CM | POA: Diagnosis not present

## 2017-12-04 DIAGNOSIS — I480 Paroxysmal atrial fibrillation: Secondary | ICD-10-CM | POA: Diagnosis not present

## 2017-12-04 LAB — KAPPA/LAMBDA LIGHT CHAINS
IG KAPPA FREE LIGHT CHAIN: 15 mg/L (ref 3.3–19.4)
IG LAMBDA FREE LIGHT CHAIN: 144.4 mg/L — AB (ref 5.7–26.3)
Kappa/Lambda FluidC Ratio: 0.1 — ABNORMAL LOW (ref 0.26–1.65)

## 2017-12-04 NOTE — Telephone Encounter (Signed)
Verbal order received and read back from Dr. Irene Limbo for Justin [name] cardiology to prescribe what is needed for this diagnisis.  There is no contraindication from the multiple myeloma point of view.  Order given to Tommye Standard PA at this time.  Renee asked when is next F/U for this patient.  12-10-2017 at 1:00 pm.

## 2017-12-04 NOTE — Patient Instructions (Addendum)
Medication Instructions:   Your physician recommends that you continue on your current medications as directed. Please refer to the Current Medication list given to you today.   If you need a refill on your cardiac medications before your next appointment, please call your pharmacy.  Labwork: NONE ORDERED  TODAY    Testing/Procedures: NONE ORDERED  TODAY    Follow-Up: IN 3 MONTHS WITH [facility] OR  URSUY  ( CANCEL [facility] APPOINTMENT)    Any Other Special Instructions Will Be Listed Below (If Applicable).

## 2017-12-04 NOTE — Telephone Encounter (Signed)
"  Patient scheduled today with [facility].  New diagnosis atrial fibrillation.  appointment today to discuss anticoagulation.  Could Dr. Irene Limbo call cell phone for Tommye Standard PA-C [phone]."

## 2017-12-06 LAB — CUP PACEART REMOTE DEVICE CHECK
Date Time Interrogation Session: 20181206124129
MDC IDC PG IMPLANT DT: 20170613

## 2017-12-08 LAB — MULTIPLE MYELOMA PANEL, SERUM
Albumin SerPl Elph-Mcnc: 4.2 g/dL (ref 2.9–4.4)
Albumin/Glob SerPl: 1.1 (ref 0.7–1.7)
Alpha 1: 0.2 g/dL (ref 0.0–0.4)
Alpha2 Glob SerPl Elph-Mcnc: 0.7 g/dL (ref 0.4–1.0)
B-Globulin SerPl Elph-Mcnc: 0.8 g/dL (ref 0.7–1.3)
Gamma Glob SerPl Elph-Mcnc: 2.6 g/dL — ABNORMAL HIGH (ref 0.4–1.8)
Globulin, Total: 4.2 g/dL — ABNORMAL HIGH (ref 2.2–3.9)
IgA, Qn, Serum: 41 mg/dL — ABNORMAL LOW (ref 61–437)
IgG, Qn, Serum: 2870 mg/dL — ABNORMAL HIGH (ref 700–1600)
IgM, Qn, Serum: 26 mg/dL (ref 15–143)
M Protein SerPl Elph-Mcnc: 2.2 g/dL — ABNORMAL HIGH
Total Protein: 8.4 g/dL (ref 6.0–8.5)

## 2017-12-09 ENCOUNTER — Telehealth: Payer: Self-pay | Admitting: Physician Assistant

## 2017-12-09 MED ORDER — RIVAROXABAN 20 MG PO TABS: 20.0000 mg | ORAL_TABLET | Freq: Every day | ORAL | 1 refills | Status: DC

## 2017-12-09 NOTE — Telephone Encounter (Signed)
Please call patient regarding his Xarelto Rx.  He wants to make sure that is goes to [facility] so that he can get it through mail order.  He said that he is not taking aspirin any longer.

## 2017-12-09 NOTE — Telephone Encounter (Signed)
Rx sent to [facility] as requested

## 2017-12-09 NOTE — Telephone Encounter (Signed)
Patient aware.

## 2017-12-10 ENCOUNTER — Telehealth: Payer: Self-pay | Admitting: Medical Oncology

## 2017-12-10 ENCOUNTER — Telehealth: Payer: Self-pay | Admitting: Hematology

## 2017-12-10 ENCOUNTER — Encounter: Payer: Self-pay | Admitting: Hematology

## 2017-12-10 ENCOUNTER — Ambulatory Visit (HOSPITAL_BASED_OUTPATIENT_CLINIC_OR_DEPARTMENT_OTHER): Payer: Medicare Other | Admitting: Hematology

## 2017-12-10 VITALS — BP 138/68 | HR 81 | Temp 97.7°F | Resp 16 | Ht 68.0 in | Wt 168.4 lb

## 2017-12-10 DIAGNOSIS — Z7901 Long term (current) use of anticoagulants: Secondary | ICD-10-CM | POA: Diagnosis not present

## 2017-12-10 DIAGNOSIS — E538 Deficiency of other specified B group vitamins: Secondary | ICD-10-CM

## 2017-12-10 DIAGNOSIS — Z8673 Personal history of transient ischemic attack (TIA), and cerebral infarction without residual deficits: Secondary | ICD-10-CM

## 2017-12-10 DIAGNOSIS — C9000 Multiple myeloma not having achieved remission: Secondary | ICD-10-CM

## 2017-12-10 DIAGNOSIS — D649 Anemia, unspecified: Secondary | ICD-10-CM

## 2017-12-10 NOTE — Telephone Encounter (Signed)
Scheduled appt per 12/19 los - Gave patient AVS and calender per los.  

## 2017-12-10 NOTE — Telephone Encounter (Signed)
I told pt he can eat and drink today prior to appt.

## 2017-12-10 NOTE — Progress Notes (Signed)
[name] Kitchen    HEMATOLOGY/ONCOLOGY CLINIC NOTE  Date of Service: 12/10/17   Patient Care Team: Mayra Neer, MD as PCP - General (Family Medicine)  CHIEF COMPLAINTS/PURPOSE OF CONSULTATION:   F/u for Plasma cell neoplasm/Multiple myeloma  HISTORY OF PRESENTING ILLNESS:   Justin [name] is a wonderful 76 y.o. 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  Patient has a history of hypertension, diabetes, GERD, gout, squamous cell skin cancers, elevated PSA, SVT status post ablation in May 2016, chronic back pain who has a history of recurrent CVA/TIA's. He apparently had a left hemispheric TIA June 2017 and has had recurrent) subcortical infarcts thought to be related to small vessel disease. He was noted to have a small PFO that was of questionable significance. He was initially on aspirin and then continued and aspirin + plavix for secondary stroke prevention. He follows with Dr. Antony Contras for his neurology cares.  An SPEP was done due to elevated total proteins of 8.9 and showed an M spike of 2 g/dL. He was also noted to have an elevated total protein of 8.6 in April 2017. He was referred to [place] for further evaluation of his M spike. Blood test did not reveal any hypercalcemia, no significant renal failure. He has been noted to have developed anemia since June the serum and his hemoglobin was 12.6 and is now down to the mid 10 range. He notes no focal bone pains at this time. Overt acute weight loss. No fevers no chills no night sweats.  CURRENT THERAPY: S/p 2 cycles of Ninlaro   INTERVAL HISTORY  Pt presents to the office today for follow-up of his myeloma. He has been doing relatively well in the interim. He did recently undergo continuous heart monitoring and he states that it did detect AFIB at one point. His most recent check of his device on 11/26/17 he was shown to have 4 episode of AFIB with 0% burden. He has been started on  Xarelto since this was discovered. Otherwise, he reports that he has had good and improving energy levels and he has not felt limited in his daily activities. He has otherwise been tolerating his Ninlaro well and without complication.   On review of systems, he reports decreased bowel movements. He denies nausea or diarrhea. He denies numbness or tingling in his hands or feet. He denies a change in his eating habits. He denies a change in his sleep habits. Pt denies fever, chills, rash, mouth sores, weight loss, decreased appetite, urinary complaints. Denies pain. Pt denies abdominal pain, nausea, vomiting. Pertinent positives are listed and detailed within the above HPI.   MEDICAL HISTORY:  Past Medical History:  Diagnosis Date  . Anemia   . Arthritis   . 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 Diabetes mellitus without complication ([place])   . GERD (gastroesophageal reflux disease)   . Gout   . History of loop recorder   . Hypertension   . Left leg weakness   . Paroxysmal SVT (supraventricular tachycardia) ([place])    a. s/p RFCA on 05/01/15  . Stroke [facility])    TIA and mild stroke    SURGICAL HISTORY: Past Surgical History:  Procedure Laterality Date  . ATRIAL FIBRILLATION ABLATION     Dr. Lovena Le  . BASAL CELL CARCINOMA EXCISION     off of back  . ELECTROPHYSIOLOGIC STUDY N/A 05/01/2015  Procedure: SVT Ablation;  Surgeon: Evans Lance, MD;  Location: [place] [facility];  Service: Cardiovascular;  Laterality: N/A;  . EP IMPLANTABLE DEVICE N/A 06/04/2016   Procedure: Loop Recorder Insertion;  Surgeon: Thompson Grayer, MD;  Location: [place] [facility];  Service: Cardiovascular;  Laterality: N/A;  . INGUINAL HERNIA REPAIR Bilateral 06/16/2017   Procedure: LAPAROSCOPIC BILATERAL INGUINAL HERNIA REPAIR;  Surgeon: Clovis Riley, MD;  Location: [place];  Service: General;  Laterality: Bilateral;  . INSERTION OF MESH Bilateral 06/16/2017   Procedure: INSERTION OF  MESH;  Surgeon: Clovis Riley, MD;  Location: [place];  Service: General;  Laterality: Bilateral;  . SQUAMOUS CELL CARCINOMA EXCISION    . TEE WITHOUT CARDIOVERSION N/A 06/04/2016   Procedure: TRANSESOPHAGEAL ECHOCARDIOGRAM (TEE);  Surgeon: Pixie Casino, MD;  Location: [facility] ENDOS[facility];  Service: Cardiovascular;  Laterality: N/A;    SOCIAL HISTORY: Social History   Socioeconomic History  . Marital status: Married    Spouse name: Not on file  . Number of children: Not on file  . Years of education: Not on file  . Highest education level: Not on file  Social Needs  . Financial resource strain: Not on file  . Food insecurity - worry: Not on file  . Food insecurity - inability: Not on file  . Transportation needs - medical: Not on file  . Transportation needs - non-medical: Not on file  Occupational History  . Not on file  Tobacco Use  . Smoking status: Never Smoker  . Smokeless tobacco: Never Used  Substance and Sexual Activity  . Alcohol use: No  . Drug use: No  . Sexual activity: Not on file  Other Topics Concern  . Not on file  Social History Narrative  . Not on file    FAMILY HISTORY: Family History  Problem Relation Age of Onset  . Stroke Mother   . Hypertension Mother   . Alzheimer's disease Father   . Heart failure Father   . Down syndrome Daughter     ALLERGIES:  is allergic to no known allergies.  MEDICATIONS:  Current Outpatient Medications  Medication Sig Dispense Refill  . acyclovir (ZOVIRAX) 400 MG tablet Take 1 tablet (400 mg total) by mouth daily. 30 tablet 2  . allopurinol (ZYLOPRIM) 300 MG tablet Take 300 mg by mouth daily.     [name] Kitchen atorvastatin (LIPITOR) 40 MG tablet Take 1 tablet (40 mg total) by mouth every morning. 90 tablet 3  . cyanocobalamin 1000 MCG tablet Take 1,000 mcg by mouth daily.    [name] Kitchen dexamethasone (DECADRON) 4 MG tablet Take 5 tablets (90m) by mouth once weekly. Take on days 1, 8, 15, and 21 of every 28day cycle 20 tablet 2  .  fluorouracil (EFUDEX) 5 % cream Apply 1 application topically daily as needed (for skin cancer prevention).   0  . glucose monitoring kit (FREESTYLE) monitoring kit 1 each by Does not apply route as needed for other.    . ixazomib citrate (NINLARO) 4 MG capsule Take 1 capsule by mouth once weekly on days 1, 8, and 15, of each 28 day cycle. Take on an empty stomach 1hr before or 2hrs after food. 3 capsule 2  . metFORMIN (GLUCOPHAGE) 1000 MG tablet Take 1,000 mg by mouth daily with breakfast.    . nitroGLYCERIN (NITROSTAT) 0.4 MG SL tablet Place 1 tablet (0.4 mg total) under the tongue every 5 (five) minutes as needed for chest pain. 25 tablet 3  .  omeprazole (PRILOSEC) 20 MG capsule Take 20 mg by mouth every morning.     . rivaroxaban (XARELTO) 20 MG TABS tablet Take 1 tablet (20 mg total) by mouth daily with supper. 90 tablet 1  . telmisartan-hydrochlorothiazide (MICARDIS HCT) 80-12.5 MG tablet Take 1 tablet by mouth daily. 90 tablet 3   No current facility-administered medications for this visit.     REVIEW OF SYSTEMS:   10 Point review of Systems was done is negative except as noted above.  PHYSICAL EXAMINATION:  ECOG PERFORMANCE STATUS: 2 - Symptomatic, <50% confined to bed  . Vitals:   12/10/17 1303  BP: 138/68  Pulse: 81  Resp: 16  Temp: 97.7 F (36.5 C)  SpO2: 98%   Filed Weights   12/10/17 1303  Weight: 168 lb 6.4 oz (76.4 kg)   Body mass index is 25.61 kg/m.  GENERAL:alert, in no acute distress and comfortable SKIN: skin color, texture, turgor are normal, no rashes or significant lesions EYES: normal, conjunctiva are pink and non-injected, sclera clear OROPHARYNX:no exudate, no erythema and lips, buccal mucosa, and tongue normal  NECK: supple, no JVD, thyroid normal size, non-tender, without nodularity LYMPH:  no palpable lymphadenopathy in the cervical, axillary or inguinal LUNGS: clear to auscultation with normal respiratory effort HEART: regular rate & rhythm,  no  murmurs and no lower extremity edema ABDOMEN: abdomen soft, non-tender, normoactive bowel sounds  Musculoskeletal: no cyanosis of digits and no clubbing  PSYCH: alert & oriented x 3 with fluent speech NEURO: no focal motor/sensory deficits  LABORATORY DATA:  I have reviewed the data as listed. CBC Latest Ref Rng & Units 12/03/2017 11/05/2017 09/30/2017  WBC 4.0 - 10.3 10e3/uL 2.5(L) 3.2(L) 3.5(L)  Hemoglobin 13.0 - 17.1 g/dL 11.5(L) 10.9(L) 10.4(L)  Hematocrit 38.4 - 49.9 % 36.4(L) 33.3(L) 33.1(L)  Platelets 140 - 400 10e3/uL 89(L) 157 255    CMP Latest Ref Rng & Units 12/03/2017 12/03/2017 11/05/2017  Glucose 70 - 140 mg/dl 108 - 109  BUN 7.0 - 26.0 mg/dL 20.4 - 20.6  Creatinine 0.7 - 1.3 mg/dL 1.1 - 1.1  Sodium 136 - 145 mEq/L 136 - 138  Potassium 3.5 - 5.1 mEq/L 3.9 - 4.2  Chloride 101 - 111 mmol/L - - -  CO2 22 - 29 mEq/L 25 - 27  Calcium 8.4 - 10.4 mg/dL 9.3 - 9.6  Total Protein 6.4 - 8.3 g/dL 9.0(H) 8.4 9.6(H)  Total Bilirubin 0.20 - 1.20 mg/dL 0.47 - 0.51  Alkaline Phos 40 - 150 U/L 62 - 55  AST 5 - 34 U/L 20 - 16  ALT 0 - 55 U/L 10 - 6   . Component     Latest Ref Rng & Units 08/26/2017  LDH     125 - 245 U/L 125  Beta 2     0.6 - 2.4 mg/L 2.9 (H)  Ferritin     22 - 316 ng/ml 308  Vitamin B12     232 - 1,245 pg/mL 483       Lab Results  Component Value Date   IRON 45 06/02/2017   TIBC 233 06/02/2017   IRONPCTSAT 19 (L) 06/02/2017   (Iron and TIBC)  Lab Results  Component Value Date   FERRITIN 308 08/26/2017           RADIOGRAPHIC STUDIES: I have personally reviewed the radiological images as listed and agreed with the findings in the report.  Bone Marrow Biopsy 12/24/2016 (Accession [id number])  Diagnosis Bone Marrow, Aspirate,Biopsy, and Clot,  left iliac crest - HYPERCELLULAR BONE MARROW FOR AGE WITH PLASMA CELL NEOPLASM. - SEE COMMENT. PERIPHERAL BLOOD: - NORMOCYTIC-NORMOCHROMIC ANEMIA. - LEUKOPENIA.  DG Bone Survey Met (Accession  [id number]) (Order [id number])  Imaging  Date: 11/28/2016 Department: [facility] Released By: Pricilla Riffle Authorizing: Brunetta Genera, MD  Exam Information   Status Exam Begun  Exam Ended   Final [99] 11/28/2016 11:59 AM 11/28/2016 12:36 PM  PACS Images   Show images for DG Bone Survey Met  Study Result   CLINICAL DATA:  Monoclonal gammopathy of unknown significance. History of squamous cell carcinoma excision, basal cell carcinoma excision.  EXAM: METASTATIC BONE SURVEY  COMPARISON:  CT neck, chest, abdomen and pelvis from 10/29/16, MRI of the head from 06/01/2016, CXR 10/27/2016, lumbar spine radiographs 06/24/2016  FINDINGS: Lateral skull: Small occipital lucency which may represent a normal arachnoid granulation posteriorly in the occiput. No definite lytic abnormality.  Cervical spine AP and lateral: Disc space narrowing at C2-3, and from C4 through C7. C4-5, C5-6 and C6-7 uncovertebral joint spurring bilaterally. No lytic abnormality.  Thoracic spine AP and lateral: T8-9 right-sided osteophytes and to a lesser degree T9-10. No lytic abnormality. Slight multilevel lumbar thoracic disc space narrowing likely degenerative.  Lumbar spine AP and lateral: Disc space narrowing at L4-5 and to a greater extent L5-S1. No lytic disease. L3 through S1 facet sclerosis.  AP pelvis: Negative for lytic disease.  Bilateral upper and lower extremities shoulders through wrist and from the hips through ankle: Negative for lytic disease. Joint space narrowing of the femorotibial compartments both knees. Subchondral cyst of the right patella.  CXR: Clear lungs. Cardiac implantable monitoring device projects over the left heart. Aortic atherosclerosis. No lytic disease.  IMPRESSION: No findings suspicious for lytic disease.   Electronically Signed   By: Ashley Royalty M.D.   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 (12/2016) CYtogenetics and Myeloma FISH panel.- trisomy 11 Patient has normocytic anemia without any other clear etiology. (>2g/dl lower that lower limit of normal which is 13) which per criteria would place this in the Multiple myeloma category as opposed to Smoldering Multiple myeloma (Borderline criterion)  No overt renal failure or hypercalcemia at this time No focal bone pains though he has significant chronic back pain related to degenerative disc disease. Bone survey shows no overt lytic lesions. Myeloma panel M spike lower at 2.2 from 2.6g/dl with decreasing Lambda FLC - suggesting initial response to treatment - Hemoglobin was slowly worsening, previously 10.9 but this has improved to 11.5. IgG and M-protein both shows slow progression of myeloma  2) Borderline low B12 levels 299 --- on replacement - now improved to 482 -continue replacement.  PLAN: -Labs steady. Hemoglobin is at 11.5. - No new hypercalcemia or renal failure or new bone pains. --He is tolerating Ninlaro, dexamethasone, and acyclovir without any acute complications.  -Discussed with patient regarding increasing dose of dexamethasone from 20 mg to 40 mg once a week. Patient reported that he would like to wait until his next round to make a decision regarding this. -He is now s/p 2 cycles, he will continue Ninlaro for deeper response.  SFLC show early response. -conitnue Ninlaro + steroids. -we discussed adding a 3rd medication to optimize treatment response - but patient would like to hold off for  now.  2) recurrent CVA - thought to be related to small vessel disease as per neurology. Has a small PFO which could be an additional risk factor. 3) Newly diagnosed P afib Plan -now on Xarelto per cardiology.  3) Patient Active Problem List   Diagnosis Date Noted  . History of  loop recorder 06/12/2017  . Cryptogenic stroke ([place]) 10/28/2016  . Gait abnormality   . History of CVA (cerebrovascular accident)   . History of TIA (transient ischemic attack)   . Benign essential HTN   . Paroxysmal SVT (supraventricular tachycardia) ([place])   . Acute blood loss anemia   . Acute ischemic stroke ([place])   . CVA (cerebral vascular accident) ([place]) 10/26/2016  . History of recent stroke 06/06/2016  . 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 TIA (transient ischemic attack) 06/02/2016  . Numbness 06/01/2016  . Right arm numbness 06/01/2016  . Atherosclerosis of native coronary artery of native heart without angina pectoris 03/20/2016  . Hypertension   . GERD (gastroesophageal reflux disease)   . Gout   . S/P RF ablation operation for arrhythmia 12/13/2014  . Hyperlipidemia 12/13/2014  . Controlled type 2 diabetes mellitus with complication, without long-term current use of insulin ([place]) 12/07/2014    PLAN -Continue f/u with PCP (blood sugars) -labs steady as of today  Labs in 5 weeks (1 week prior to clinic visit) RTC with Dr Irene Limbo in 6 weeks    All of the patients questions were answered with apparent satisfaction. The patient knows to call the clinic with any problems, questions or concerns.  I spent 20 minutes counseling the patient face to face. The total time spent in the appointment was 25 minutes and more than 50% was on counseling and direct patient cares.   Sullivan Lone MD [place] AAHIVMS [facility] Hematology/Oncology Physician [place]  (Office):       [phone] (Work cell):  [phone] (Fax):           [phone]   This document serves as a record of services personally performed by Sullivan Lone, MD. It was created on his behalf by Reola Mosher, a trained medical scribe. The creation of this record is based on the scribe's personal observations and the provider's statements to them.   .I have reviewed the above documentation  for accuracy and completeness, and I agree with the above. Brunetta Genera MD MS

## 2017-12-11 MED FILL — DEXAMETHASONE 4 MG TABLET: 4 | 28 days supply | Qty: 20 | Fill #2

## 2017-12-11 MED FILL — ACYCLOVIR 400 MG TABLET: 400 | 30 days supply | Qty: 30 | Fill #2

## 2017-12-11 MED FILL — NINLARO 4 MG CAP: 4 | 28 days supply | Qty: 3 | Fill #2

## 2017-12-17 ENCOUNTER — Other Ambulatory Visit: Payer: Self-pay | Admitting: Pharmacist

## 2017-12-29 ENCOUNTER — Ambulatory Visit (INDEPENDENT_AMBULATORY_CARE_PROVIDER_SITE_OTHER): Payer: Medicare Other | Admitting: *Deleted

## 2017-12-29 DIAGNOSIS — I639 Cerebral infarction, unspecified: Secondary | ICD-10-CM | POA: Diagnosis not present

## 2017-12-29 NOTE — Progress Notes (Signed)
Carelink Summary Report / Loop Recorder 

## 2017-12-30 ENCOUNTER — Other Ambulatory Visit: Payer: Self-pay | Admitting: *Deleted

## 2017-12-30 ENCOUNTER — Telehealth: Payer: Self-pay | Admitting: Pharmacy Technician

## 2017-12-30 DIAGNOSIS — C9000 Multiple myeloma not having achieved remission: Secondary | ICD-10-CM

## 2017-12-30 MED ORDER — DEXAMETHASONE 4 MG PO TABS: ORAL_TABLET | ORAL | 2 refills | Status: DC

## 2017-12-30 MED ORDER — ACYCLOVIR 400 MG PO TABS: 400.0000 mg | ORAL_TABLET | Freq: Every day | ORAL | 2 refills | Status: DC

## 2017-12-30 MED ORDER — DEXAMETHASONE 4 MG PO TABS: ORAL_TABLET | ORAL | 0 refills | Status: DC

## 2017-12-30 MED ORDER — ACYCLOVIR 400 MG PO TABS: 400.0000 mg | ORAL_TABLET | Freq: Every day | ORAL | 0 refills | Status: DC

## 2017-12-30 NOTE — Telephone Encounter (Signed)
Oral Oncology Patient Advocate Encounter  Received a call from Mr. Lindell with hopes that he could receive a 3 month supply of Ninlaro from a mail order pharmacy for a lower overall cost.    I contacted [employer] for details about what is allowed on the patient's plan.    Ninlaro is not available to be dispensed at [facility] mail order.   They advised that he is receiving the best cost getting it from [facility].  He agrees to continue getting this from [facility].    However, he would like to get 90 day supplies of acyclovir and dexamethasone from [facility] mail order pharmacy.  He CAN receive this at a lower cost by using this option.  New prescriptions for acyclovir and dexamethasone will need to be sent to [facility] if this is ok with MD.   [name]. [name], [place] Patient [place] [phone] 12/30/2017 1:26 PM

## 2018-01-01 DIAGNOSIS — C9000 Multiple myeloma not having achieved remission: Secondary | ICD-10-CM | POA: Diagnosis not present

## 2018-01-01 DIAGNOSIS — Z7984 Long term (current) use of oral hypoglycemic drugs: Secondary | ICD-10-CM | POA: Diagnosis not present

## 2018-01-01 DIAGNOSIS — I10 Essential (primary) hypertension: Secondary | ICD-10-CM | POA: Diagnosis not present

## 2018-01-01 DIAGNOSIS — I4891 Unspecified atrial fibrillation: Secondary | ICD-10-CM | POA: Diagnosis not present

## 2018-01-01 DIAGNOSIS — I2510 Atherosclerotic heart disease of native coronary artery without angina pectoris: Secondary | ICD-10-CM | POA: Diagnosis not present

## 2018-01-01 DIAGNOSIS — E1159 Type 2 diabetes mellitus with other circulatory complications: Secondary | ICD-10-CM | POA: Diagnosis not present

## 2018-01-01 DIAGNOSIS — Z8673 Personal history of transient ischemic attack (TIA), and cerebral infarction without residual deficits: Secondary | ICD-10-CM | POA: Diagnosis not present

## 2018-01-05 ENCOUNTER — Other Ambulatory Visit: Payer: Self-pay | Admitting: Hematology

## 2018-01-05 ENCOUNTER — Other Ambulatory Visit: Payer: Self-pay

## 2018-01-05 DIAGNOSIS — C9000 Multiple myeloma not having achieved remission: Secondary | ICD-10-CM

## 2018-01-05 MED ORDER — IXAZOMIB CITRATE 4 MG PO CAPS: ORAL_CAPSULE | ORAL | 2 refills | Status: DC

## 2018-01-07 DIAGNOSIS — D1800 Hemangioma unspecified site: Secondary | ICD-10-CM | POA: Diagnosis not present

## 2018-01-07 DIAGNOSIS — Z23 Encounter for immunization: Secondary | ICD-10-CM | POA: Diagnosis not present

## 2018-01-07 DIAGNOSIS — D225 Melanocytic nevi of trunk: Secondary | ICD-10-CM | POA: Diagnosis not present

## 2018-01-07 DIAGNOSIS — L814 Other melanin hyperpigmentation: Secondary | ICD-10-CM | POA: Diagnosis not present

## 2018-01-07 DIAGNOSIS — L821 Other seborrheic keratosis: Secondary | ICD-10-CM | POA: Diagnosis not present

## 2018-01-07 DIAGNOSIS — Z85828 Personal history of other malignant neoplasm of skin: Secondary | ICD-10-CM | POA: Diagnosis not present

## 2018-01-07 DIAGNOSIS — Z86018 Personal history of other benign neoplasm: Secondary | ICD-10-CM | POA: Diagnosis not present

## 2018-01-07 DIAGNOSIS — L570 Actinic keratosis: Secondary | ICD-10-CM | POA: Diagnosis not present

## 2018-01-10 ENCOUNTER — Emergency Department (HOSPITAL_BASED_OUTPATIENT_CLINIC_OR_DEPARTMENT_OTHER)
Admit: 2018-01-10 | Discharge: 2018-01-10 | Disposition: A | Discharge status: Home / Self Care | Payer: Medicare Other | Attending: Emergency Medicine | Admitting: Emergency Medicine

## 2018-01-10 ENCOUNTER — Emergency Department (HOSPITAL_COMMUNITY): Payer: Medicare Other

## 2018-01-10 ENCOUNTER — Encounter (HOSPITAL_COMMUNITY): Payer: Self-pay | Admitting: Emergency Medicine

## 2018-01-10 ENCOUNTER — Emergency Department (HOSPITAL_COMMUNITY)
Admission: EM | Admit: 2018-01-10 | Discharge: 2018-01-10 | Disposition: A | Discharge status: Home / Self Care | Payer: Medicare Other | Attending: Emergency Medicine | Admitting: Emergency Medicine

## 2018-01-10 ENCOUNTER — Other Ambulatory Visit: Payer: Self-pay

## 2018-01-10 DIAGNOSIS — M79609 Pain in unspecified limb: Secondary | ICD-10-CM | POA: Diagnosis not present

## 2018-01-10 DIAGNOSIS — Z7984 Long term (current) use of oral hypoglycemic drugs: Secondary | ICD-10-CM | POA: Insufficient documentation

## 2018-01-10 DIAGNOSIS — Z79899 Other long term (current) drug therapy: Secondary | ICD-10-CM | POA: Diagnosis not present

## 2018-01-10 DIAGNOSIS — M7989 Other specified soft tissue disorders: Secondary | ICD-10-CM | POA: Diagnosis not present

## 2018-01-10 DIAGNOSIS — Z8673 Personal history of transient ischemic attack (TIA), and cerebral infarction without residual deficits: Secondary | ICD-10-CM | POA: Diagnosis not present

## 2018-01-10 DIAGNOSIS — E119 Type 2 diabetes mellitus without complications: Secondary | ICD-10-CM | POA: Insufficient documentation

## 2018-01-10 DIAGNOSIS — M79651 Pain in right thigh: Secondary | ICD-10-CM | POA: Insufficient documentation

## 2018-01-10 DIAGNOSIS — Z7901 Long term (current) use of anticoagulants: Secondary | ICD-10-CM | POA: Insufficient documentation

## 2018-01-10 DIAGNOSIS — M79604 Pain in right leg: Secondary | ICD-10-CM

## 2018-01-10 DIAGNOSIS — I119 Hypertensive heart disease without heart failure: Secondary | ICD-10-CM | POA: Diagnosis not present

## 2018-01-10 LAB — CUP PACEART REMOTE DEVICE CHECK
MDC IDC PG IMPLANT DT: 20170613
MDC IDC SESS DTM: 20190105124206

## 2018-01-10 MED ORDER — HYDROCODONE-ACETAMINOPHEN 5-325 MG PO TABS: 1.0000 | ORAL_TABLET | Freq: Four times a day (QID) | ORAL | 0 refills | Status: DC | PRN

## 2018-01-10 MED ORDER — HYDROCODONE-ACETAMINOPHEN 5-325 MG PO TABS
1.0000 | ORAL_TABLET | Freq: Once | ORAL | Status: AC
  Administered 2018-01-10: 1 via ORAL
  Filled 2018-01-10: qty 1

## 2018-01-10 NOTE — ED Notes (Signed)
Patient is A & x4.  He understood AVS instructions.  Questions were answered accordingly.

## 2018-01-10 NOTE — Progress Notes (Signed)
Preliminary results by tech - Right Lower Ext. Venous Duplex Completed. Negative for deep and superficial vein thrombosis. Shaivi Rothschild, BS, RDMS, RVT  

## 2018-01-10 NOTE — ED Notes (Signed)
Bed: [facility]PT2 Expected date:  Expected time:  Means of arrival:  Comments: 

## 2018-01-10 NOTE — ED Triage Notes (Signed)
Patient state he is having leg pain in the back of his right leg. Patient is getting chemo. Patient has not found anything to make the pain go away. Patient wants to make sure it is not a blood clot.

## 2018-01-10 NOTE — ED Provider Notes (Signed)
[facility] DEPT Provider Note   CSN: [id number] Arrival date & time: 01/10/18  0409     History   Chief Complaint Chief Complaint  Patient presents with  . Leg Pain    HPI Justin [name] is a 77 y.o. male.  The history is provided by the patient. No language interpreter was used.  Leg Pain      Justin [name] is a 77 y.o. male who presents to the Emergency Department complaining of leg pain.  He has a history of multiple myeloma as well as atrial fibrillation and takes Xarelto.  Last night he developed a throbbing pain in the right posterior thigh that is constant in nature.  He took Motrin at home with no significant change in his pain.  Overall his pain has slightly improved.  There is no change with activity or positioning.  He denies any fevers, chills, chest pain, shortness of breath, nausea, vomiting, numbness, weakness.  No prior similar symptoms.  Past Medical History:  Diagnosis Date  . Anemia   . Arthritis   . 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 Diabetes mellitus without complication ([place])   . GERD (gastroesophageal reflux disease)   . Gout   . History of loop recorder   . Hypertension   . Left leg weakness   . Paroxysmal SVT (supraventricular tachycardia) ([place])    a. s/p RFCA on 05/01/15  . Stroke [facility])    TIA and mild stroke    Patient Active Problem List   Diagnosis Date Noted  . History of loop recorder 06/12/2017  . 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 Gait abnormality   . History of CVA (cerebrovascular accident)   . History of TIA (transient ischemic attack)   . Benign essential HTN   . Paroxysmal SVT (supraventricular tachycardia) ([place])   . Acute blood loss anemia   . Acute ischemic stroke ([place])   . CVA (cerebral vascular accident) ([place]) 10/26/2016  . History of recent stroke 06/06/2016  . 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 TIA (transient ischemic attack) 06/02/2016    . Numbness 06/01/2016  . Right arm numbness 06/01/2016  . Atherosclerosis of native coronary artery of native heart without angina pectoris 03/20/2016  . Hypertension   . GERD (gastroesophageal reflux disease)   . Gout   . S/P RF ablation operation for arrhythmia 12/13/2014  . Hyperlipidemia 12/13/2014  . Controlled type 2 diabetes mellitus with complication, without long-term current use of insulin ([place]) 12/07/2014    Past Surgical History:  Procedure Laterality Date  . ATRIAL FIBRILLATION ABLATION     Dr. Lovena Le  . BASAL CELL CARCINOMA EXCISION     off of back  . ELECTROPHYSIOLOGIC STUDY N/A 05/01/2015   Procedure: SVT Ablation;  Surgeon: Evans Lance, MD;  Location: [facility] CV LAB;  Service: Cardiovascular;  Laterality: N/A;  . EP IMPLANTABLE DEVICE N/A 06/04/2016   Procedure: Loop Recorder Insertion;  Surgeon: Thompson Grayer, MD;  Location: [facility] CV LAB;  Service: Cardiovascular;  Laterality: N/A;  . INGUINAL HERNIA REPAIR Bilateral 06/16/2017   Procedure: LAPAROSCOPIC BILATERAL INGUINAL HERNIA REPAIR;  Surgeon: Clovis Riley, MD;  Location: [facility];  Service: General;  Laterality: Bilateral;  . INSERTION OF MESH Bilateral 06/16/2017   Procedure: INSERTION OF MESH;  Surgeon: Clovis Riley, MD;  Location: [facility];  Service: General;  Laterality: Bilateral;  . SQUAMOUS CELL CARCINOMA EXCISION    .  TEE WITHOUT CARDIOVERSION N/A 06/04/2016   Procedure: TRANSESOPHAGEAL ECHOCARDIOGRAM (TEE);  Surgeon: Pixie Casino, MD;  Location: [facility] ENDOSCOPY;  Service: Cardiovascular;  Laterality: N/A;       Home Medications    Prior to Admission medications   Medication Sig Start Date End Date Taking? Authorizing Provider  acyclovir (ZOVIRAX) 400 MG tablet Take 1 tablet (400 mg total) by mouth daily. 12/30/17   Brunetta Genera, MD  allopurinol (ZYLOPRIM) 300 MG tablet Take 300 mg by mouth daily.     [provider]  atorvastatin (LIPITOR) 40 MG tablet Take 1 tablet (40  mg total) by mouth every morning. 09/02/17   Hilty, Nadean Corwin, MD  cyanocobalamin 1000 MCG tablet Take 1,000 mcg by mouth daily.    [provider]  dexamethasone (DECADRON) 4 MG tablet Take 5 tablets (42m) by mouth once weekly. Take on days 1, 8, 15, and 21 of every 28day cycle 12/30/17   KBrunetta Genera MD  fluorouracil (EFUDEX) 5 % cream Apply 1 application topically daily as needed (for skin cancer prevention).  12/28/14   [provider]  glucose monitoring kit (FREESTYLE) monitoring kit 1 each by Does not apply route as needed for other.    [provider]  HYDROcodone-acetaminophen (NORCO/VICODIN) 5-325 MG tablet Take 1 tablet by mouth every 6 (six) hours as needed for severe pain. 01/10/18   RQuintella Reichert MD  ixazomib citrate (NINLARO) 4 MG capsule Take 1 capsule by mouth once weekly on days 1, 8, and 15, of each 28 day cycle. Take on an empty stomach 1hr before or 2hrs after food. 01/05/18   KBrunetta Genera MD  metFORMIN (GLUCOPHAGE) 1000 MG tablet Take 1,000 mg by mouth daily with breakfast.    [provider]  nitroGLYCERIN (NITROSTAT) 0.4 MG SL tablet Place 1 tablet (0.4 mg total) under the tongue every 5 (five) minutes as needed for chest pain. 01/31/15   Hilty, KNadean Corwin MD  omeprazole (PRILOSEC) 20 MG capsule Take 20 mg by mouth every morning.     [provider]  rivaroxaban (XARELTO) 20 MG TABS tablet Take 1 tablet (20 mg total) by mouth daily with supper. 12/09/17   Allred, JJeneen Rinks MD  telmisartan-hydrochlorothiazide (MICARDIS HCT) 80-12.5 MG tablet Take 1 tablet by mouth daily. 02/21/17   MAlmyra Deforest PA    Family History Family History  Problem Relation Age of Onset  . Stroke Mother   . Hypertension Mother   . Alzheimer's disease Father   . Heart failure Father   . Down syndrome Daughter     Social History Social History   Tobacco Use  . Smoking status: Never Smoker  . Smokeless tobacco: Never Used  Substance Use Topics   . Alcohol use: No  . Drug use: No     Allergies   No known allergies   Review of Systems Review of Systems  All other systems reviewed and are negative.    Physical Exam Updated Vital Signs BP (!) 144/89 (BP Location: Right Arm)   Pulse 69   Temp (!) 97.5 F (36.4 C) (Oral)   Resp 16   Ht _0  (1.727 m)   Wt 78.3 kg (172 lb 11.2 oz)   SpO2 100%   BMI 26.26 kg/m   Physical Exam  Constitutional: He is oriented to person, place, and time. He appears well-developed and well-nourished.  HENT:  Head: Normocephalic and atraumatic.  Cardiovascular: Normal rate and regular rhythm.  No murmur heard. Pulmonary/Chest:  Effort normal and breath sounds normal. No respiratory distress.  Abdominal: Soft. There is no tenderness. There is no rebound and no guarding.  Musculoskeletal: He exhibits no tenderness.  2+ DP pulses bilaterally.  There are no rashes or lesions to bilateral lower extremities.  No tenderness to palpation throughout the right lower extremity.  1+ pitting edema to bilateral lower extremities.  Normal gait.  Neurological: He is alert and oriented to person, place, and time.  5/5 strength in BLE.    Skin: Skin is warm and dry. Capillary refill takes less than 2 seconds.  Psychiatric: He has a normal mood and affect. His behavior is normal.  Nursing note and vitals reviewed.    ED Treatments / Results  Labs (all labs ordered are listed, but only abnormal results are displayed) Labs Reviewed - No data to display  EKG  EKG Interpretation None       Radiology Dg Femur Min 2 Views Right  Result Date: 01/10/2018 CLINICAL DATA:  77 year old male with posterior right lower extremity pain for 2 days from the hip to the knee with no known injury. Monoclonal gammopathy of unknown significance. EXAM: RIGHT FEMUR 2 VIEWS COMPARISON:  Skeletal survey 11/28/2016 FINDINGS: Right femoral head remains normally located. Right hip joint space appears normal. Visible right  pelvis bone mineralization is normal. Right SI joint appear stable and normal. Stable and intact proximal right femur. Right femoral shaft intact. Right knee alignment and joint spaces are stable. No right knee joint effusion is evident. Mild calcified peripheral vascular disease in the right thigh. Intact visible right tibia and fibula. IMPRESSION: No acute osseous abnormality identified about the right femur. Right hip joint space remains normal. Electronically Signed   By: Genevie Ann M.D.   On: 01/10/2018 08:07    Procedures Procedures (including critical care time)  Medications Ordered in ED Medications  HYDROcodone-acetaminophen (NORCO/VICODIN) 5-325 MG per tablet 1 tablet (1 tablet Oral Given 01/10/18 0903)     Initial Impression / Assessment and Plan / ED Course  I have reviewed the triage vital signs and the nursing notes.  Pertinent labs & imaging results that were available during my care of the patient were reviewed by me and considered in my medical decision making (see chart for details).     Patient here for evaluation of acute right leg pain.  His pain is improved after treatment in the department.  He is well perfused on examination with no evidence of acute fracture, infection, DVT.  Discussed with patient home care for leg pain of unclear etiology.  Discussed outpatient follow-up and return precautions.  Final Clinical Impressions(s) / ED Diagnoses   Final diagnoses:  Right leg pain    ED Discharge Orders        Ordered    HYDROcodone-acetaminophen (NORCO/VICODIN) 5-325 MG tablet  Every 6 hours PRN     01/10/18 1253       Quintella Reichert, MD 01/10/18 1534

## 2018-01-10 NOTE — ED Notes (Signed)
Negative for DVT per vascular

## 2018-01-10 NOTE — ED Notes (Signed)
Pt with multiple myeloma is c/o leg pain onset last night at 23:00. He states the pain starts at the posterior aspect of the right thigh and radiates downward to the right calf. He describes the pain as a constant ache. He is taking Ninlaro, which has side effects of bone pain.

## 2018-01-12 ENCOUNTER — Inpatient Hospital Stay: Payer: Medicare Other | Attending: Hematology

## 2018-01-12 DIAGNOSIS — M79651 Pain in right thigh: Secondary | ICD-10-CM | POA: Insufficient documentation

## 2018-01-12 DIAGNOSIS — E538 Deficiency of other specified B group vitamins: Secondary | ICD-10-CM | POA: Insufficient documentation

## 2018-01-12 DIAGNOSIS — C9000 Multiple myeloma not having achieved remission: Secondary | ICD-10-CM | POA: Diagnosis not present

## 2018-01-12 DIAGNOSIS — D649 Anemia, unspecified: Secondary | ICD-10-CM | POA: Insufficient documentation

## 2018-01-12 DIAGNOSIS — Z8673 Personal history of transient ischemic attack (TIA), and cerebral infarction without residual deficits: Secondary | ICD-10-CM | POA: Insufficient documentation

## 2018-01-12 DIAGNOSIS — M545 Low back pain: Secondary | ICD-10-CM | POA: Diagnosis not present

## 2018-01-12 DIAGNOSIS — I10 Essential (primary) hypertension: Secondary | ICD-10-CM | POA: Insufficient documentation

## 2018-01-12 DIAGNOSIS — E119 Type 2 diabetes mellitus without complications: Secondary | ICD-10-CM | POA: Insufficient documentation

## 2018-01-12 DIAGNOSIS — Z85828 Personal history of other malignant neoplasm of skin: Secondary | ICD-10-CM | POA: Insufficient documentation

## 2018-01-12 LAB — COMPREHENSIVE METABOLIC PANEL
ALBUMIN: 4.3 g/dL (ref 3.5–5.0)
ALK PHOS: 51 U/L (ref 40–150)
ALT: 9 U/L (ref 0–55)
ANION GAP: 8 (ref 3–11)
AST: 17 U/L (ref 5–34)
BUN: 24 mg/dL (ref 7–26)
CALCIUM: 9.9 mg/dL (ref 8.4–10.4)
CHLORIDE: 102 mmol/L (ref 98–109)
CO2: 28 mmol/L (ref 22–29)
Creatinine, Ser: 1.21 mg/dL (ref 0.70–1.30)
GFR calc Af Amer: >60 mL/min (ref >60)
GFR calc non Af Amer: 56 mL/min — ABNORMAL LOW (ref >60)
GLUCOSE: 78 mg/dL (ref 70–140)
Potassium: 4.4 mmol/L (ref 3.5–5.1)
SODIUM: 138 mmol/L (ref 136–145)
Total Bilirubin: 0.4 mg/dL (ref 0.2–1.2)
Total Protein: 9.1 g/dL — ABNORMAL HIGH (ref 6.4–8.3)

## 2018-01-12 LAB — CBC
HEMATOCRIT: 36.5 % — AB (ref 38.4–49.9)
HEMOGLOBIN: 11.8 g/dL — AB (ref 13.0–17.1)
MCH: 30.3 pg (ref 27.2–33.4)
MCHC: 32.3 g/dL (ref 32.0–36.0)
MCV: 93.8 fL (ref 79.3–98.0)
Platelets: 127 10*3/uL — ABNORMAL LOW (ref 140–400)
RBC: 3.89 MIL/uL — ABNORMAL LOW (ref 4.20–5.82)
RDW: 16.2 % — ABNORMAL HIGH (ref 11.0–15.6)
WBC: 3.8 10*3/uL — ABNORMAL LOW (ref 4.0–10.3)

## 2018-01-12 LAB — RETICULOCYTES
RBC.: 3.89 MIL/uL — ABNORMAL LOW (ref 4.20–5.82)
RETIC CT PCT: 0.5 % — AB (ref 0.8–1.8)
Retic Count, Absolute: 19.5 10*3/uL — ABNORMAL LOW (ref 34.8–93.9)

## 2018-01-12 MED FILL — NINLARO 4 MG CAP: 4 | 3 days supply | Qty: 3 | Fill #0

## 2018-01-13 ENCOUNTER — Telehealth: Payer: Self-pay | Admitting: *Deleted

## 2018-01-13 LAB — KAPPA/LAMBDA LIGHT CHAINS
KAPPA, LAMDA LIGHT CHAIN RATIO: 0.12 — AB (ref 0.26–1.65)
Kappa free light chain: 19.5 mg/L — ABNORMAL HIGH (ref 3.3–19.4)
Lambda free light chains: 163.3 mg/L — ABNORMAL HIGH (ref 5.7–26.3)

## 2018-01-13 NOTE — Telephone Encounter (Signed)
Pt called to inform Dr. Irene Limbo of recent visit to ED due to persistent right leg pain.  Pt stated the ED was "no help".  Chart reviewed with Dr. Irene Limbo, no obvious cause for pain/clots noted on ED exam.  Per Dr. Irene Limbo, pain probably from cramping due to dehydration or neuropathy from ninlaro.  Pt stated "This is not a cramp, I know what a cramp is."  Pt still instructed to increase hydration/electrolytes to see if it makes a difference.  Pt stated hydrocodone is not helping.  Advised pt that hydrocodone would likely not help if pain caused by dehydration or neuropathy.  Pt given option to be evaluated by Dr. Irene Limbo prior to 1/30 appt.  Pt stated he will be out of town the next three days.  Advised pt to call if anything changes/worsens before next week.  Pt verbalized understanding.

## 2018-01-14 ENCOUNTER — Other Ambulatory Visit: Payer: Medicare Other

## 2018-01-14 LAB — MULTIPLE MYELOMA PANEL, SERUM
ALPHA2 GLOB SERPL ELPH-MCNC: 0.8 g/dL (ref 0.4–1.0)
Albumin SerPl Elph-Mcnc: 4.3 g/dL (ref 2.9–4.4)
Albumin/Glob SerPl: 1 (ref 0.7–1.7)
Alpha 1: 0.3 g/dL (ref 0.0–0.4)
B-GLOBULIN SERPL ELPH-MCNC: 0.9 g/dL (ref 0.7–1.3)
Gamma Glob SerPl Elph-Mcnc: 2.4 g/dL — ABNORMAL HIGH (ref 0.4–1.8)
Globulin, Total: 4.4 g/dL — ABNORMAL HIGH (ref 2.2–3.9)
IGG (IMMUNOGLOBIN G), SERUM: 2691 mg/dL — AB (ref 700–1600)
IgA: 35 mg/dL — ABNORMAL LOW (ref 61–437)
IgM (Immunoglobulin M), Srm: 27 mg/dL (ref 15–143)
M PROTEIN SERPL ELPH-MCNC: 2.2 g/dL — AB
TOTAL PROTEIN ELP: 8.7 g/dL — AB (ref 6.0–8.5)

## 2018-01-20 ENCOUNTER — Telehealth: Payer: Self-pay | Admitting: Physician Assistant

## 2018-01-20 NOTE — Progress Notes (Signed)
[name] Kitchen    HEMATOLOGY/ONCOLOGY CLINIC NOTE  Date of Service: 01/21/18   Patient Care Team: Mayra Neer, MD as PCP - General (Family Medicine)  CHIEF COMPLAINTS/PURPOSE OF CONSULTATION:   F/u for Plasma cell neoplasm/Multiple myeloma  HISTORY OF PRESENTING ILLNESS:   Justin [name] is a wonderful 77 y.o. 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  Patient has a history of hypertension, diabetes, GERD, gout, squamous cell skin cancers, elevated PSA, SVT status post ablation in May 2016, chronic back pain who has a history of recurrent CVA/TIA's. He apparently had a left hemispheric TIA June 2017 and has had recurrent) subcortical infarcts thought to be related to small vessel disease. He was noted to have a small PFO that was of questionable significance. He was initially on aspirin and then continued and aspirin + plavix for secondary stroke prevention. He follows with Dr. Antony Contras for his neurology cares.  An SPEP was done due to elevated total proteins of 8.9 and showed an M spike of 2 g/dL. He was also noted to have an elevated total protein of 8.6 in April 2017. He was referred to [place] for further evaluation of his M spike. Blood test did not reveal any hypercalcemia, no significant renal failure. He has been noted to have developed anemia since June the serum and his hemoglobin was 12.6 and is now down to the mid 10 range. He notes no focal bone pains at this time. Overt acute weight loss. No fevers no chills no night sweats.  CURRENT THERAPY: S/p 4 cycles of Ninlaro   INTERVAL HISTORY  Pt presents to the office today for follow-up of his myeloma. His last visit with [place] was on 12/10/17. The pt reports that he is doing well overall.   The pt reports experiencing pain that came on all of a sudden in his right upper thigh and has persisted for two weeks. He reports that he doubts he caused this pain by muscle exertion. He  reports that the pain sometimes keeps him up at night. He denies leg swelling, rashes, redness. He reports that it is a dull ache. He reports that standing causes more discomfort, and pain lessens as he takes weight off of it. He has been trying to stay well hydrated with electrolyte enhanced water. Pt also reports that it does not feel like a muscle cramp. He reports that hydrocodone q 6 hours did not help his pain so he quit taking it. He denies that the pain remains concentrated in a single location. He reports that he used a hot pad without relief. He also took an Epsom salt bath without relief. He reports that the pain is worst at night, and that his legs do feel a little restless at night.   Of note since the patient last visit, he had pain in his upper thigh for which the pt has had DG Femur Min 2 Views Right completed on 01/10/18 with results revealing No acute osseous abnormality identified about the right femur. Right hip joint space remains normal.  Most recent lab results on 01/12/18 of CBC, CMP, and Reticulocytes is as follows: all values are WNL except for WBC at 3.8, RBC at 3.89, Hgb at 11.8, HCT at 36.5, RDW at 16.2 and Platelets at 127. Total protein at 9.1.  Most recent (01/12/18) Kappa/lambda light chains were as follows: Kappa free light chains at 19.5, Lambda free light chains at 163.3 and their  ratio at 0.12.  Most recent MMP (01/12/18) revealed IgG at 2691, IgA at 35, Total Protein at 8.7, Gamma Glob at 2.4, M Protein at 2.2, Globulin total at 4.4 and all other values WNL.   We discussed that since his M spike has plateaued we would be to make some changes to his treatment. He is having a difficult time coming to grips with adding/change is myeloma medications. We discussed adding Revlimid or Cytoxan and he was given material to read about these . He shall let us know.  On review of systems, pt reports right upper leg pain, regular bowel movements,  and denies back pain, abdominal pains,  and any other symptoms.    MEDICAL HISTORY:  Past Medical History:  Diagnosis Date  . Anemia   . Arthritis   . 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 Diabetes mellitus without complication ([place])   . GERD (gastroesophageal reflux disease)   . Gout   . History of loop recorder   . Hypertension   . Left leg weakness   . Paroxysmal SVT (supraventricular tachycardia) ([place])    a. s/p RFCA on 05/01/15  . Stroke [facility])    TIA and mild stroke    SURGICAL HISTORY: Past Surgical History:  Procedure Laterality Date  . ATRIAL FIBRILLATION ABLATION     Dr. Lovena Le  . BASAL CELL CARCINOMA EXCISION     off of back  . ELECTROPHYSIOLOGIC STUDY N/A 05/01/2015   Procedure: SVT Ablation;  Surgeon: Evans Lance, MD;  Location: [place] [facility];  Service: Cardiovascular;  Laterality: N/A;  . EP IMPLANTABLE DEVICE N/A 06/04/2016   Procedure: Loop Recorder Insertion;  Surgeon: Thompson Grayer, MD;  Location: [place] [facility];  Service: Cardiovascular;  Laterality: N/A;  . INGUINAL HERNIA REPAIR Bilateral 06/16/2017   Procedure: LAPAROSCOPIC BILATERAL INGUINAL HERNIA REPAIR;  Surgeon: Clovis Riley, MD;  Location: [place];  Service: General;  Laterality: Bilateral;  . INSERTION OF MESH Bilateral 06/16/2017   Procedure: INSERTION OF MESH;  Surgeon: Clovis Riley, MD;  Location: [place];  Service: General;  Laterality: Bilateral;  . SQUAMOUS CELL CARCINOMA EXCISION    . TEE WITHOUT CARDIOVERSION N/A 06/04/2016   Procedure: TRANSESOPHAGEAL ECHOCARDIOGRAM (TEE);  Surgeon: Pixie Casino, MD;  Location: [facility] ENDOSCOPY;  Service: Cardiovascular;  Laterality: N/A;    SOCIAL HISTORY: Social History   Socioeconomic History  . Marital status: Married    Spouse name: Not on file  . Number of children: Not on file  . Years of education: Not on file  . Highest education level: Not on file  Social Needs  . Financial resource strain: Not on file  . Food insecurity - worry:  Not on file  . Food insecurity - inability: Not on file  . Transportation needs - medical: Not on file  . Transportation needs - non-medical: Not on file  Occupational History  . Not on file  Tobacco Use  . Smoking status: Never Smoker  . Smokeless tobacco: Never Used  Substance and Sexual Activity  . Alcohol use: No  . Drug use: No  . Sexual activity: Not on file  Other Topics Concern  . Not on file  Social History Narrative  . Not on file    FAMILY HISTORY: Family History  Problem Relation Age of Onset  . Stroke Mother   . Hypertension Mother   . Alzheimer's disease Father   . Heart failure  Father   . Down syndrome Daughter     ALLERGIES:  is allergic to no known allergies.  MEDICATIONS:  Current Outpatient Medications  Medication Sig Dispense Refill  . acyclovir (ZOVIRAX) 400 MG tablet Take 1 tablet (400 mg total) by mouth daily. 90 tablet 0  . allopurinol (ZYLOPRIM) 300 MG tablet Take 300 mg by mouth daily.     [name] Kitchen atorvastatin (LIPITOR) 40 MG tablet Take 1 tablet (40 mg total) by mouth every morning. 90 tablet 3  . cyanocobalamin 1000 MCG tablet Take 1,000 mcg by mouth daily.    [name] Kitchen dexamethasone (DECADRON) 4 MG tablet Take 5 tablets (26m) by mouth once weekly. Take on days 1, 8, 15, and 21 of every 28day cycle 60 tablet 0  . fluorouracil (EFUDEX) 5 % cream Apply 1 application topically daily as needed (for skin cancer prevention).   0  . glucose monitoring kit (FREESTYLE) monitoring kit 1 each by Does not apply route as needed for other.    . ixazomib citrate (NINLARO) 4 MG capsule Take 1 capsule by mouth once weekly on days 1, 8, and 15, of each 28 day cycle. Take on an empty stomach 1hr before or 2hrs after food. 3 capsule 2  . metFORMIN (GLUCOPHAGE) 1000 MG tablet Take 1,000 mg by mouth daily with breakfast.    . omeprazole (PRILOSEC) 20 MG capsule Take 20 mg by mouth every morning.     . rivaroxaban (XARELTO) 20 MG TABS tablet Take 1 tablet (20 mg total) by mouth  daily with supper. 90 tablet 1  . telmisartan-hydrochlorothiazide (MICARDIS HCT) 80-12.5 MG tablet Take 1 tablet by mouth daily. 90 tablet 3  . HYDROcodone-acetaminophen (NORCO/VICODIN) 5-325 MG tablet Take 1 tablet by mouth every 6 (six) hours as needed for severe pain. (Patient not taking: Reported on 01/21/2018) 10 tablet 0  . nitroGLYCERIN (NITROSTAT) 0.4 MG SL tablet Place 1 tablet (0.4 mg total) under the tongue every 5 (five) minutes as needed for chest pain. (Patient not taking: Reported on 01/21/2018) 25 tablet 3   No current facility-administered medications for this visit.     REVIEW OF SYSTEMS:   10 Point review of Systems was done is negative except as noted above.  PHYSICAL EXAMINATION:  ECOG PERFORMANCE STATUS: 2 - Symptomatic, <50% confined to bed  . Vitals:   01/21/18 1035  BP: 123/77  Pulse: 81  Resp: 20  Temp: (!) 97.5 F (36.4 C)  SpO2: 99%   Filed Weights   01/21/18 1035  Weight: 169 lb 4.8 oz (76.8 kg)   Body mass index is 25.74 kg/m.  GENERAL:alert, in no acute distress and comfortable SKIN: skin color, texture, turgor are normal, no rashes or significant lesions EYES: normal, conjunctiva are pink and non-injected, sclera clear OROPHARYNX:no exudate, no erythema and lips, buccal mucosa, and tongue normal  NECK: supple, no JVD, thyroid normal size, non-tender, without nodularity LYMPH:  no palpable lymphadenopathy in the cervical, axillary or inguinal LUNGS: clear to auscultation with normal respiratory effort HEART: regular rate & rhythm,  no murmurs and no lower extremity edema ABDOMEN: abdomen soft, non-tender, normoactive bowel sounds  Musculoskeletal: no cyanosis of digits and no clubbing  PSYCH: alert & oriented x 3 with fluent speech NEURO: no focal motor/sensory deficits  LABORATORY DATA:  I have reviewed the data as listed. CBC Latest Ref Rng & Units 01/12/2018 12/03/2017 11/05/2017  WBC 4.0 - 10.3 K/uL 3.8(L) 2.5(L) 3.2(L)  Hemoglobin 13.0  - 17.1 g/dL 11.8(L) 11.5(L) 10.9(L)  Hematocrit  38.4 - 49.9 % 36.5(L) 36.4(L) 33.3(L)  Platelets 140 - 400 K/uL 127(L) 89(L) 157    CMP Latest Ref Rng & Units 01/12/2018 12/03/2017 12/03/2017  Glucose 70 - 140 mg/dL 78 108 -  BUN 7 - 26 mg/dL 24 20.4 -  Creatinine 0.70 - 1.30 mg/dL 1.21 1.1 -  Sodium 136 - 145 mmol/L 138 136 -  Potassium 3.5 - 5.1 mmol/L 4.4 3.9 -  Chloride 98 - 109 mmol/L 102 - -  CO2 22 - 29 mmol/L 28 25 -  Calcium 8.4 - 10.4 mg/dL 9.9 9.3 -  Total Protein 6.4 - 8.3 g/dL 9.1(H) 9.0(H) 8.4  Total Bilirubin 0.2 - 1.2 mg/dL 0.4 0.47 -  Alkaline Phos 40 - 150 U/L 51 62 -  AST 5 - 34 U/L 17 20 -  ALT 0 - 55 U/L 9 10 -   . Component     Latest Ref Rng & Units 08/26/2017  LDH     125 - 245 U/L 125  Beta 2     0.6 - 2.4 mg/L 2.9 (H)  Ferritin     22 - 316 ng/ml 308  Vitamin B12     232 - 1,245 pg/mL 483       Lab Results  Component Value Date   IRON 45 06/02/2017   TIBC 233 06/02/2017   IRONPCTSAT 19 (L) 06/02/2017   (Iron and TIBC)  Lab Results  Component Value Date   FERRITIN 308 08/26/2017           RADIOGRAPHIC STUDIES: I have personally reviewed the radiological images as listed and agreed with the findings in the report.  Bone Marrow Biopsy 12/24/2016 (Accession [id number])  Diagnosis Bone Marrow, Aspirate,Biopsy, and Clot, left iliac crest - HYPERCELLULAR BONE MARROW FOR AGE WITH PLASMA CELL NEOPLASM. - SEE COMMENT. PERIPHERAL BLOOD: - NORMOCYTIC-NORMOCHROMIC ANEMIA. - LEUKOPENIA.  DG Bone Survey Met (Accession [id number]) (Order [id number])  Imaging  Date: 11/28/2016 Department: [facility]-RAD[facility]OLOG[facility] Released By: Pricilla Riffle Authorizing: Brunetta Genera, MD  Exam Information   Status Exam Begun  Exam Ended   Final [99] 11/28/2016 11:59 AM 11/28/2016 12:36 PM  PACS Images   Show images for DG Bone Survey Met  Study Result   CLINICAL DATA:  Monoclonal gammopathy of unknown  significance. History of squamous cell carcinoma excision, basal cell carcinoma excision.  EXAM: METASTATIC BONE SURVEY  COMPARISON:  CT neck, chest, abdomen and pelvis from 10/29/16, MRI of the head from 06/01/2016, CXR 10/27/2016, lumbar spine radiographs 06/24/2016  FINDINGS: Lateral skull: Small occipital lucency which may represent a normal arachnoid granulation posteriorly in the occiput. No definite lytic abnormality.  Cervical spine AP and lateral: Disc space narrowing at C2-3, and from C4 through C7. C4-5, C5-6 and C6-7 uncovertebral joint spurring bilaterally. No lytic abnormality.  Thoracic spine AP and lateral: T8-9 right-sided osteophytes and to a lesser degree T9-10. No lytic abnormality. Slight multilevel lumbar thoracic disc space narrowing likely degenerative.  Lumbar spine AP and lateral: Disc space narrowing at L4-5 and to a greater extent L5-S1. No lytic disease. L3 through S1 facet sclerosis.  AP pelvis: Negative for lytic disease.  Bilateral upper and lower extremities shoulders through wrist and from the hips through ankle: Negative for lytic disease. Joint space narrowing of the femorotibial compartments both knees. Subchondral cyst of the right patella.  CXR: Clear lungs. Cardiac implantable monitoring device projects over the left heart. Aortic atherosclerosis. No lytic disease.  IMPRESSION: No findings suspicious for  lytic disease.   Electronically Signed   By: Ashley Royalty M.D.   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 (12/2016) CYtogenetics and Myeloma FISH panel.- trisomy 11 Patient has normocytic anemia without any other clear etiology. (>2g/dl lower that lower limit of normal which is 13) which per criteria would place this in the Multiple myeloma category as opposed to Smoldering  Multiple myeloma (Borderline criterion)  No overt renal failure or hypercalcemia at this time No focal bone pains though he has significant chronic back pain related to degenerative disc disease. Bone survey shows no overt lytic lesions. PLAN - M-protein plateaued  -Discussed pt lab work today -Since M spike has reached a plateau at 2.2, same as 1 month ago, Adjusting treatment is necessary. -Discussed Revlimid and cyclophosphamide as two possible options in addition to continuing ninlaro/Dexamethasone.  -The pt has been taking xarelto which reduces the risk of blood clots from any of the treatments. -Discussed the possible side effects of Revlimid and cyclophosphamide with the pt.  -Pt let us know that he wants a little time to consider his options of treatment in light of his M Protein leveling off for the past month. -We supplied the pt with supplemental information.  2) Borderline low B12 levels 299 --- on replacement - now improved to 482 -continue replacement.  2) recurrent CVA - thought to be related to small vessel disease as per neurology. Has a small PFO which could be an additional risk factor. 3) Newly diagnosed P afib Plan -now on Xarelto per cardiology.  3) Patient Active Problem List   Diagnosis Date Noted  . History of loop recorder 06/12/2017  . Cryptogenic stroke ([place]) 10/28/2016  . Gait abnormality   . History of CVA (cerebrovascular accident)   . History of TIA (transient ischemic attack)   . Benign essential HTN   . Paroxysmal SVT (supraventricular tachycardia) ([place])   . Acute blood loss anemia   . Acute ischemic stroke ([place])   . CVA (cerebral vascular accident) ([place]) 10/26/2016  . History of recent stroke 06/06/2016  . 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 TIA (transient ischemic attack) 06/02/2016  . Numbness 06/01/2016  . Right arm numbness 06/01/2016  . Atherosclerosis of native coronary artery of native heart without angina pectoris 03/20/2016   . Hypertension   . GERD (gastroesophageal reflux disease)   . Gout   . S/P RF ablation operation for arrhythmia 12/13/2014  . Hyperlipidemia 12/13/2014  . Controlled type 2 diabetes mellitus with complication, without long-term current use of insulin ([place]) 12/07/2014    PLAN -Continue f/u with PCP (blood sugars) -labs steady as of today  4) Lower extremity discomfort Rediculopathy ruled out -[place] on 1/19 revealed no blood clot -DG Femur Min 2 Views Right completed on 01/10/18 with results revealing No acute osseous abnormality identified about the right femur. Right hip joint space remains normal. -Per his bone study, Degenerative discs and narrow nerve root opening at L4 and L5 could be causing this discomfort; could be a pinched nerve. -Will refer to an orthopaedist and PT  -MRI on lower back and nerve conduction study to r/o or confirm nerve pain, which would guide our choice of a nerve related medication. -Since hydrocodone q 6 hours did nothing for the pt, discussed with pt that he could take 2  pills for the first dose. -We will prescribe tramadol for his pain  MRI L spine Orthopedics referral for back and rt lower extremity pain RTC with Dr Irene Limbo in 3 weeks with labs Patient to let us know about decision on treatment change   All of the patients questions were answered with apparent satisfaction. The patient knows to call the clinic with any problems, questions or concerns.  I spent 20 minutes counseling the patient face to face. The total time spent in the appointment was 25 minutes and more than 50% was on counseling and direct patient cares.   Sullivan Lone MD [place] AAHIVMS [facility] Hematology/Oncology [facility]  (Office):       [phone] (Work cell):  [phone] (Fax):           [phone]  This document serves as a record of services personally performed by Sullivan Lone, MD. It was created on his behalf by Baldwin Jamaica, a trained medical  scribe. The creation of this record is based on the scribe's personal observations and the provider's statements to them.   .I have reviewed the above documentation for accuracy and completeness, and I agree with the above. Brunetta Genera MD MS

## 2018-01-20 NOTE — Telephone Encounter (Signed)
Patient calling, has a question about his plan for treatment.

## 2018-01-21 ENCOUNTER — Encounter: Payer: Self-pay | Admitting: Hematology

## 2018-01-21 ENCOUNTER — Telehealth: Payer: Self-pay | Admitting: Hematology

## 2018-01-21 ENCOUNTER — Inpatient Hospital Stay (HOSPITAL_BASED_OUTPATIENT_CLINIC_OR_DEPARTMENT_OTHER): Payer: Medicare Other | Admitting: Hematology

## 2018-01-21 VITALS — BP 123/77 | HR 81 | Temp 97.5°F | Resp 20 | Ht 68.0 in | Wt 169.3 lb

## 2018-01-21 DIAGNOSIS — I10 Essential (primary) hypertension: Secondary | ICD-10-CM

## 2018-01-21 DIAGNOSIS — M545 Low back pain: Secondary | ICD-10-CM

## 2018-01-21 DIAGNOSIS — M79651 Pain in right thigh: Secondary | ICD-10-CM

## 2018-01-21 DIAGNOSIS — C9000 Multiple myeloma not having achieved remission: Secondary | ICD-10-CM | POA: Diagnosis not present

## 2018-01-21 DIAGNOSIS — M549 Dorsalgia, unspecified: Secondary | ICD-10-CM

## 2018-01-21 DIAGNOSIS — Z85828 Personal history of other malignant neoplasm of skin: Secondary | ICD-10-CM | POA: Diagnosis not present

## 2018-01-21 DIAGNOSIS — E538 Deficiency of other specified B group vitamins: Secondary | ICD-10-CM

## 2018-01-21 DIAGNOSIS — E119 Type 2 diabetes mellitus without complications: Secondary | ICD-10-CM

## 2018-01-21 DIAGNOSIS — D649 Anemia, unspecified: Secondary | ICD-10-CM | POA: Diagnosis not present

## 2018-01-21 DIAGNOSIS — Z8673 Personal history of transient ischemic attack (TIA), and cerebral infarction without residual deficits: Secondary | ICD-10-CM

## 2018-01-21 MED ORDER — TRAMADOL HCL 50 MG PO TABS: 50.0000 mg | ORAL_TABLET | Freq: Four times a day (QID) | ORAL | 0 refills | Status: DC | PRN

## 2018-01-21 MED FILL — traMADol HCL 50 MG TABS: 50 | 7 days supply | Qty: 50 | Fill #0

## 2018-01-21 NOTE — Telephone Encounter (Signed)
Gave avs and calendar for February  °

## 2018-01-22 ENCOUNTER — Telehealth: Payer: Self-pay

## 2018-01-22 NOTE — Telephone Encounter (Signed)
Spoke with pt regarding multiple Orthopaedic appointments. One scheduled for 2/8 at 0830 with [facility] and the other on 2/14 at 1000 with [facility]. Plan for this RN to call offices tomorrow to confirm reason for each. Per MD note MRI of back and orthopaedic surgeon referral placed.   Pt to expect return call tomorrow.

## 2018-01-23 ENCOUNTER — Telehealth: Payer: Self-pay

## 2018-01-23 NOTE — Telephone Encounter (Signed)
Pt currently has appointments scheduled with both [facility] ([facility]) and [facility] in the next two weeks. Discussed with Dr. Irene Limbo, and the pt is okay to choose whichever location he prefers to be seen. Called pt and he decided to go with [facility]. Confirmed appt with their office and pt called [facility] to cancel appt.   MRI of lumbar spine order faxed to [facility] to [phone] as directed by [facility], Network engineer. Confirmed fax receipt 01/23/18 at 1534.

## 2018-01-23 NOTE — Telephone Encounter (Signed)
SPOKE TO PT AND PT HAS CONCERNS OF  WHY HE HASN'T RECEIVED HIS  HOME REMOTE BOX FOR HIS DEVICE TRANSMISSION YET AND WHAT IF HE MAY HAVE A EPISODE HOW  WILL ANYONE KNOW.  PT STATES HE HAS TALKED TO BEVERLY AND BRANDON FROM [employer]  AND HAS WAS TOLD HE ONLY HAD TWO EPISODE S OF AFIB SINCE LAST TRANSMISSION. ALSO,THAT IF HE HAS ANY EPISODE SOMEONE WILL CONTACT HIM BUT IF NOT HE'S FINE.  PT WAS REASSURED TYPICALLY THAT HOW IT WORKS UNLESS HIS DEVICE MAY A SPECIAL CIRCUMSTANCE WHERE IT MAY NEED  CLOSER FOLLOWING UP WITH .

## 2018-01-23 NOTE — Telephone Encounter (Signed)
PT WAS TOLD THAT HE HAS AN APPT ON 03-07-18 WITH [facility] AND TO KEEP THAT APPOINTMENT.  PT WAS TOLD THAT I  WILL LOOK IN TO THIS MATTER AND WILL CONTACT BACK IF I FIND OUT ANY NEW INFORMATION ON WHEN HIS AT HOME REMOTE BOX WILL BE COMING.

## 2018-01-26 ENCOUNTER — Ambulatory Visit (INDEPENDENT_AMBULATORY_CARE_PROVIDER_SITE_OTHER): Payer: Medicare Other | Admitting: *Deleted

## 2018-01-26 DIAGNOSIS — I639 Cerebral infarction, unspecified: Secondary | ICD-10-CM

## 2018-01-27 NOTE — Progress Notes (Signed)
Carelink Summary Report / Loop Recorder 

## 2018-01-30 ENCOUNTER — Telehealth: Payer: Self-pay | Admitting: *Deleted

## 2018-01-30 DIAGNOSIS — M5416 Radiculopathy, lumbar region: Secondary | ICD-10-CM | POA: Insufficient documentation

## 2018-01-30 DIAGNOSIS — M545 Low back pain, unspecified: Secondary | ICD-10-CM

## 2018-01-30 DIAGNOSIS — M51369 Other intervertebral disc degeneration, lumbar region without mention of lumbar back pain or lower extremity pain: Secondary | ICD-10-CM

## 2018-01-30 DIAGNOSIS — M5136 Other intervertebral disc degeneration, lumbar region: Secondary | ICD-10-CM | POA: Diagnosis not present

## 2018-01-30 HISTORY — DX: Other intervertebral disc degeneration, lumbar region without mention of lumbar back pain or lower extremity pain: M51.369

## 2018-01-30 HISTORY — DX: Low back pain, unspecified: M54.50

## 2018-01-30 HISTORY — DX: Other intervertebral disc degeneration, lumbar region: M51.36

## 2018-01-30 HISTORY — DX: Radiculopathy, lumbar region: M54.16

## 2018-01-30 NOTE — Telephone Encounter (Signed)
Received call from pt stating that he missed his dose of Ninlaro & Dex yesterday & wanted to know what to do.  Informed that it should be fine to take today.  He also states that he had 2 ortho appts somehow & one was scheduled by this office & he doesn't know who scheduled the other one but he cancelled one of them.  He saw a PA at [facility] name & will see Dr Rolena Infante 02/13/18.  He has an appt with the PT for eval & also an MRI at their facility 2/16.  He states that an MRI was also ordered by this office.  Informed that if they are both for the Lumbar spine to cancel one by [place] if someone calls to schedule.  Informed message will be sent to Dr Launa Flight RN

## 2018-02-05 ENCOUNTER — Telehealth: Payer: Self-pay | Admitting: Internal Medicine

## 2018-02-05 ENCOUNTER — Telehealth: Payer: Self-pay

## 2018-02-05 ENCOUNTER — Ambulatory Visit (INDEPENDENT_AMBULATORY_CARE_PROVIDER_SITE_OTHER): Payer: Medicare Other | Admitting: Physical Medicine and Rehabilitation

## 2018-02-05 MED ORDER — RIVAROXABAN 20 MG PO TABS: 20.0000 mg | ORAL_TABLET | Freq: Every day | ORAL | 2 refills | Status: DC

## 2018-02-05 NOTE — Telephone Encounter (Signed)
Xarelto 20mg  refill request received; pt is 76 yrs old, wt-76.8kg, Crea-1.21 on 01/12/18, & last seen by Tommye Standard on 12/04/17, CrCl-55.17ml/min; will send in refill to requested pharmacy.

## 2018-02-05 NOTE — Addendum Note (Signed)
Addended by: Derrel Nip B on: 02/05/2018 04:48 PM   Modules accepted: Orders

## 2018-02-05 NOTE — Telephone Encounter (Signed)
New message     *STAT* If patient is at the pharmacy, call can be transferred to refill team.   1. Which medications need to be refilled? (please list name of each medication and dose if known) rivaroxaban (XARELTO) 20 MG TABS tablet  2. Which pharmacy/location (including street and city if local pharmacy) is medication to be sent to? [facility] [place], [place]  3. Do they need a 30 day or 90 day supply?[place]

## 2018-02-05 NOTE — Telephone Encounter (Signed)
Pt called notifying us that he received a call from [facility]. Informed pt not to worry about this appt, but to continue appointments with [facility], now [facility]. Pt verbalized that he has been scheduled for his MRI and Physical Therapy through the Orthopedist.   Pt additionally concerned about refill of xarelto and loop recorder being replaced. Explained that both of these issues would need to be discussed with Dr. Rayann Heman. Pt continued to discuss with this RN. Reiterated importance of speaking with Dr. Rayann Heman for the refill request and discussion of the loop recorder issues and changes. Pt verbalized understanding and plan to talk to Dr. Rayann Heman or other staff at his office.

## 2018-02-06 ENCOUNTER — Other Ambulatory Visit: Payer: Self-pay | Admitting: *Deleted

## 2018-02-06 DIAGNOSIS — C9000 Multiple myeloma not having achieved remission: Secondary | ICD-10-CM

## 2018-02-06 MED ORDER — ACYCLOVIR 400 MG PO TABS: 400.0000 mg | ORAL_TABLET | Freq: Every day | ORAL | 0 refills | Status: DC

## 2018-02-07 DIAGNOSIS — M5416 Radiculopathy, lumbar region: Secondary | ICD-10-CM | POA: Diagnosis not present

## 2018-02-07 DIAGNOSIS — M545 Low back pain: Secondary | ICD-10-CM | POA: Diagnosis not present

## 2018-02-09 MED FILL — NINLARO 4 MG CAP: 4 | 28 days supply | Qty: 3 | Fill #1

## 2018-02-10 ENCOUNTER — Inpatient Hospital Stay: Payer: Medicare Other | Attending: Hematology

## 2018-02-10 DIAGNOSIS — E538 Deficiency of other specified B group vitamins: Secondary | ICD-10-CM | POA: Diagnosis not present

## 2018-02-10 DIAGNOSIS — I10 Essential (primary) hypertension: Secondary | ICD-10-CM | POA: Insufficient documentation

## 2018-02-10 DIAGNOSIS — D649 Anemia, unspecified: Secondary | ICD-10-CM | POA: Diagnosis not present

## 2018-02-10 DIAGNOSIS — I480 Paroxysmal atrial fibrillation: Secondary | ICD-10-CM | POA: Insufficient documentation

## 2018-02-10 DIAGNOSIS — C9000 Multiple myeloma not having achieved remission: Secondary | ICD-10-CM | POA: Diagnosis not present

## 2018-02-10 DIAGNOSIS — E119 Type 2 diabetes mellitus without complications: Secondary | ICD-10-CM | POA: Diagnosis not present

## 2018-02-10 DIAGNOSIS — Z7901 Long term (current) use of anticoagulants: Secondary | ICD-10-CM | POA: Diagnosis not present

## 2018-02-10 LAB — CBC WITH DIFFERENTIAL (CANCER CENTER ONLY)
Basophils Absolute: 0 10*3/uL (ref 0.0–0.1)
Basophils Relative: 0 %
EOS PCT: 1 %
Eosinophils Absolute: 0 10*3/uL (ref 0.0–0.5)
HCT: 38.3 % — ABNORMAL LOW (ref 38.4–49.9)
Hemoglobin: 12.4 g/dL — ABNORMAL LOW (ref 13.0–17.1)
Lymphocytes Relative: 13 %
Lymphs Abs: 0.5 10*3/uL — ABNORMAL LOW (ref 0.9–3.3)
MCH: 30 pg (ref 27.2–33.4)
MCHC: 32.4 g/dL (ref 32.0–36.0)
MCV: 92.7 fL (ref 79.3–98.0)
MONOS PCT: 8 %
Monocytes Absolute: 0.3 10*3/uL (ref 0.1–0.9)
Neutro Abs: 3.1 10*3/uL (ref 1.5–6.5)
Neutrophils Relative %: 78 %
PLATELETS: 137 10*3/uL — AB (ref 140–400)
RBC: 4.13 MIL/uL — ABNORMAL LOW (ref 4.20–5.82)
RDW: 15.5 % — AB (ref 11.0–14.6)
WBC Count: 4 10*3/uL (ref 4.0–10.3)
nRBC: 0 /100 WBC

## 2018-02-10 LAB — CMP (CANCER CENTER ONLY)
ALBUMIN: 4.6 g/dL (ref 3.5–5.0)
ALK PHOS: 51 U/L (ref 40–150)
ALT: 9 U/L (ref 0–55)
ANION GAP: 9 (ref 3–11)
AST: 24 U/L (ref 5–34)
BILIRUBIN TOTAL: 0.5 mg/dL (ref 0.2–1.2)
BUN: 26 mg/dL (ref 7–26)
CO2: 27 mmol/L (ref 22–29)
CREATININE: 1.29 mg/dL (ref 0.70–1.30)
Calcium: 10 mg/dL (ref 8.4–10.4)
Chloride: 102 mmol/L (ref 98–109)
GFR, Estimated: 52 mL/min — ABNORMAL LOW (ref >60)
GLUCOSE: 93 mg/dL (ref 70–140)
POTASSIUM: 4 mmol/L (ref 3.5–5.1)
Sodium: 138 mmol/L (ref 136–145)
Total Protein: 9.3 g/dL — ABNORMAL HIGH (ref 6.4–8.3)

## 2018-02-10 LAB — RETICULOCYTES
RBC.: 4.13 MIL/uL — ABNORMAL LOW (ref 4.20–5.82)
Retic Count, Absolute: 20.7 10*3/uL — ABNORMAL LOW (ref 34.8–93.9)
Retic Ct Pct: 0.5 % — ABNORMAL LOW (ref 0.8–1.8)

## 2018-02-11 LAB — KAPPA/LAMBDA LIGHT CHAINS
Kappa free light chain: 13.7 mg/L (ref 3.3–19.4)
Kappa, lambda light chain ratio: 0.1 — ABNORMAL LOW (ref 0.26–1.65)
Lambda free light chains: 133.2 mg/L — ABNORMAL HIGH (ref 5.7–26.3)

## 2018-02-11 NOTE — Progress Notes (Signed)
[name] Kitchen    HEMATOLOGY/ONCOLOGY CLINIC NOTE  Date of Service: 02/12/18   Patient Care Team: Mayra Neer, MD as PCP - General (Family Medicine)  CHIEF COMPLAINTS/PURPOSE OF CONSULTATION:   F/u for multiple myeloma  HISTORY OF PRESENTING ILLNESS:   Justin [name] is a wonderful 77 y.o. 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  Patient has a history of hypertension, diabetes, GERD, gout, squamous cell skin cancers, elevated PSA, SVT status post ablation in May 2016, chronic back pain who has a history of recurrent CVA/TIA's. He apparently had a left hemispheric TIA June 2017 and has had recurrent) subcortical infarcts thought to be related to small vessel disease. He was noted to have a small PFO that was of questionable significance. He was initially on aspirin and then continued and aspirin + plavix for secondary stroke prevention. He follows with Dr. Antony Contras for his neurology cares.  An SPEP was done due to elevated total proteins of 8.9 and showed an M spike of 2 g/dL. He was also noted to have an elevated total protein of 8.6 in April 2017. He was referred to [place] for further evaluation of his M spike. Blood test did not reveal any hypercalcemia, no significant renal failure. He has been noted to have developed anemia since June the serum and his hemoglobin was 12.6 and is now down to the mid 10 range. He notes no focal bone pains at this time. Overt acute weight loss. No fevers no chills no night sweats.  CURRENT THERAPY: S/p 4 cycles of Ninlaro   INTERVAL HISTORY  Justin [name] presents to the office today for follow-up of his myeloma. The patient's last visit with [place] was on 01/21/18. The pt reports that he is doing well overall. He reports that he is following up with [facility] and seeing Dr. Rolena Infante tomorrow regarding his lower extremity pain, and that the back pain and leg/foot pain due to radiculopathy is  more bearable. He reports that he has begun going to PT.  Pt reports taking his Vitamin B12 consistently. He reports that he is set up for his 4th cycle of Ninlaro today. He reports that when he stands up he experiences some instability, but looks forward to continuing to address this with PT. He takes Micardis for his BP.   Lab results (02/10/18) of CBC, CMP, and Reticulocytes is as follows: all values are WNL except for RBC at 4.13, Hgb at 12.4, HCT at 38.3, RDW at 15.5, Platelets at 137k, Lymphs Abs at 0.5, Total Protein at 9.3, Retic Ct Pct at 0.5%, and Retic Count Abs at 20.7. MMP (02/10/18) M spike down to 2.1  Kappa/lambda 02/10/18 revealed lower Lambda light chains at 133.2, and Kappa:Lambda ratio at 0.10.   He has had a chance to consider Weekly Cytoxan vs Revlimid and the pros and cons and has choosen to proceed with Revlimid. Is already on Xarelto for afib.  On review of systems, pt reports some resolving BLE pain, some physical instability, and denies abdominal pain, leg swelling, and any new symptoms.    MEDICAL HISTORY:  Past Medical History:  Diagnosis Date  . Anemia   . Arthritis   . 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 Diabetes mellitus without complication ([place])   . GERD (gastroesophageal reflux disease)   . Gout   . History of loop recorder   . Hypertension   .  Left leg weakness   . Paroxysmal SVT (supraventricular tachycardia) ([place])    a. s/p RFCA on 05/01/15  . Stroke [facility])    TIA and mild stroke    SURGICAL HISTORY: Past Surgical History:  Procedure Laterality Date  . ATRIAL FIBRILLATION ABLATION     Dr. Lovena Le  . BASAL CELL CARCINOMA EXCISION     off of back  . ELECTROPHYSIOLOGIC STUDY N/A 05/01/2015   Procedure: SVT Ablation;  Surgeon: Evans Lance, MD;  Location: [place] [facility];  Service: Cardiovascular;  Laterality: N/A;  . EP IMPLANTABLE DEVICE N/A 06/04/2016   Procedure: Loop Recorder Insertion;  Surgeon: Thompson Grayer, MD;  Location: [place] [facility];  Service: Cardiovascular;  Laterality: N/A;  . INGUINAL HERNIA REPAIR Bilateral 06/16/2017   Procedure: LAPAROSCOPIC BILATERAL INGUINAL HERNIA REPAIR;  Surgeon: Clovis Riley, MD;  Location: [place];  Service: General;  Laterality: Bilateral;  . INSERTION OF MESH Bilateral 06/16/2017   Procedure: INSERTION OF MESH;  Surgeon: Clovis Riley, MD;  Location: [place];  Service: General;  Laterality: Bilateral;  . SQUAMOUS CELL CARCINOMA EXCISION    . TEE WITHOUT CARDIOVERSION N/A 06/04/2016   Procedure: TRANSESOPHAGEAL ECHOCARDIOGRAM (TEE);  Surgeon: Pixie Casino, MD;  Location: [facility];  Service: Cardiovascular;  Laterality: N/A;    SOCIAL HISTORY: Social History   Socioeconomic History  . Marital status: Married    Spouse name: Not on file  . Number of children: Not on file  . Years of education: Not on file  . Highest education level: Not on file  Social Needs  . Financial resource strain: Not on file  . Food insecurity - worry: Not on file  . Food insecurity - inability: Not on file  . Transportation needs - medical: Not on file  . Transportation needs - non-medical: Not on file  Occupational History  . Not on file  Tobacco Use  . Smoking status: Never Smoker  . Smokeless tobacco: Never Used  Substance and Sexual Activity  . Alcohol use: No  . Drug use: No  . Sexual activity: Not on file  Other Topics Concern  . Not on file  Social History Narrative  . Not on file    FAMILY HISTORY: Family History  Problem Relation Age of Onset  . Stroke Mother   . Hypertension Mother   . Alzheimer's disease Father   . Heart failure Father   . Down syndrome Daughter     ALLERGIES:  is allergic to no known allergies.  MEDICATIONS:  Current Outpatient Medications  Medication Sig Dispense Refill  . acyclovir (ZOVIRAX) 400 MG tablet Take 1 tablet (400 mg total) by mouth daily. 90 tablet 0  . allopurinol (ZYLOPRIM) 300 MG tablet  Take 300 mg by mouth daily.     [name] Kitchen atorvastatin (LIPITOR) 40 MG tablet Take 1 tablet (40 mg total) by mouth every morning. 90 tablet 3  . cyanocobalamin 1000 MCG tablet Take 1,000 mcg by mouth daily.    [name] Kitchen dexamethasone (DECADRON) 4 MG tablet Take 5 tablets (18m) by mouth once weekly. Take on days 1, 8, 15, and 21 of every 28day cycle 60 tablet 0  . fluorouracil (EFUDEX) 5 % cream Apply 1 application topically daily as needed (for skin cancer prevention).   0  . glucose monitoring kit (FREESTYLE) monitoring kit 1 each by Does not apply route as needed for other.    . ixazomib citrate (NINLARO) 4 MG capsule Take 1 capsule by mouth once  weekly on days 1, 8, and 15, of each 28 day cycle. Take on an empty stomach 1hr before or 2hrs after food. 3 capsule 2  . metFORMIN (GLUCOPHAGE) 1000 MG tablet Take 1,000 mg by mouth daily with breakfast.    . omeprazole (PRILOSEC) 20 MG capsule Take 20 mg by mouth every morning.     . rivaroxaban (XARELTO) 20 MG TABS tablet Take 1 tablet (20 mg total) by mouth daily with supper. 90 tablet 2  . telmisartan-hydrochlorothiazide (MICARDIS HCT) 80-12.5 MG tablet Take 1 tablet by mouth daily. 90 tablet 3  . HYDROcodone-acetaminophen (NORCO/VICODIN) 5-325 MG tablet Take 1 tablet by mouth every 6 (six) hours as needed for severe pain. (Patient not taking: Reported on 01/21/2018) 10 tablet 0  . lenalidomide (REVLIMID) 25 MG capsule Take 1 capsule (25 mg total) by mouth daily. Authorization #[id number] 02/12/18 21 capsule 2  . nitroGLYCERIN (NITROSTAT) 0.4 MG SL tablet Place 1 tablet (0.4 mg total) under the tongue every 5 (five) minutes as needed for chest pain. (Patient not taking: Reported on 01/21/2018) 25 tablet 3  . traMADol (ULTRAM) 50 MG tablet Take 1-2 tablets (50-100 mg total) by mouth every 6 (six) hours as needed for moderate pain or severe pain. (Patient not taking: Reported on 02/12/2018) 50 tablet 0   No current facility-administered medications for this visit.      REVIEW OF SYSTEMS:    .10 Point review of Systems was done is negative except as noted above.   PHYSICAL EXAMINATION:  ECOG PERFORMANCE STATUS: 2 - Symptomatic, <50% confined to bed  . Vitals:   02/12/18 1008  BP: 110/68  Pulse: 80  Resp: 18  Temp: 97.8 F (36.6 C)  SpO2: 99%   Filed Weights   02/12/18 1008  Weight: 166 lb 6.4 oz (75.5 kg)   Body mass index is 25.3 kg/m.  [name] Kitchen GENERAL:alert, in no acute distress and comfortable SKIN: no acute rashes, no significant lesions EYES: conjunctiva are pink and non-injected, sclera anicteric OROPHARYNX: MMM, no exudates, no oropharyngeal erythema or ulceration NECK: supple, no JVD LYMPH:  no palpable lymphadenopathy in the cervical, axillary or inguinal regions LUNGS: clear to auscultation b/l with normal respiratory effort HEART: regular rate & rhythm ABDOMEN:  normoactive bowel sounds , non tender, not distended. Extremity: no pedal edema PSYCH: alert & oriented x 3 with fluent speech NEURO: no focal motor/sensory deficits    LABORATORY DATA:  I have reviewed the data as listed. CBC Latest Ref Rng & Units 02/10/2018 01/12/2018 12/03/2017  WBC 4.0 - 10.3 K/uL 4.0 3.8(L) 2.5(L)  Hemoglobin 13.0 - 17.1 g/dL - 11.8(L) 11.5(L)  Hematocrit 38.4 - 49.9 % 38.3(L) 36.5(L) 36.4(L)  Platelets 140 - 400 K/uL 137(L) 127(L) 89(L)  hgb 12.4   CMP Latest Ref Rng & Units 02/10/2018 01/12/2018 12/03/2017  Glucose 70 - 140 mg/dL 93 78 108  BUN 7 - 26 mg/dL 26 24 20.4  Creatinine 0.70 - 1.30 mg/dL 1.29 1.21 1.1  Sodium 136 - 145 mmol/L 138 138 136  Potassium 3.5 - 5.1 mmol/L 4.0 4.4 3.9  Chloride 98 - 109 mmol/L 102 102 -  CO2 22 - 29 mmol/L 27 28 25   Calcium 8.4 - 10.4 mg/dL 10.0 9.9 9.3  Total Protein 6.4 - 8.3 g/dL 9.3(H) 9.1(H) 9.0(H)  Total Bilirubin 0.2 - 1.2 mg/dL 0.5 0.4 0.47  Alkaline Phos 40 - 150 U/L 51 51 62  AST 5 - 34 U/L 24 17 20   ALT 0 - 55 U/L 9  9 10   . Component     Latest Ref Rng & Units 08/26/2017  LDH      125 - 245 U/L 125  Beta 2     0.6 - 2.4 mg/L 2.9 (H)  Ferritin     22 - 316 ng/ml 308  Vitamin B12     232 - 1,245 pg/mL 483     Component     Latest Ref Rng & Units 08/26/2017 11/05/2017 12/03/2017 01/12/2018  IgG (Immunoglobin G), Serum     700 - 1,600 mg/dL 3,430 (H) 3,333 (H) 2,870 (H) 2,691 (H)  IgA     61 - 437 mg/dL    35 (L)  IgM (Immunoglobulin M), Srm     15 - 143 mg/dL    27  Total Protein ELP     6.0 - 8.5 g/dL    8.7 (H)  Albumin SerPl Elph-Mcnc     2.9 - 4.4 g/dL 4.3 4.0 4.2 4.3  Alpha 1     0.0 - 0.4 g/dL 0.3 0.2 0.2 0.3  Alpha2 Glob SerPl Elph-Mcnc     0.4 - 1.0 g/dL 0.8 0.7 0.7 0.8  B-Globulin SerPl Elph-Mcnc     0.7 - 1.3 g/dL 0.8 0.9 0.8 0.9  Gamma Glob SerPl Elph-Mcnc     0.4 - 1.8 g/dL 2.9 (H) 2.9 (H) 2.6 (H) 2.4 (H)  M Protein SerPl Elph-Mcnc     Not Observed g/dL 2.6 (H) 2.5 (H) 2.2 (H) 2.2 (H)  Globulin, Total     2.2 - 3.9 g/dL 4.7 (H) 4.8 (H) 4.2 (H) 4.4 (H)  Albumin/Glob SerPl     0.7 - 1.7 1.0 0.9 1.1 1.0  IFE 1      Comment Comment Comment Comment  Please Note (HCV):      Comment Comment Comment Comment   Component     Latest Ref Rng & Units 02/10/2018  IgG (Immunoglobin G), Serum     700 - 1,600 mg/dL 2,957 (H)  IgA     61 - 437 mg/dL 37 (L)  IgM (Immunoglobulin M), Srm     15 - 143 mg/dL 26  Total Protein ELP     6.0 - 8.5 g/dL 9.0 (H)  Albumin SerPl Elph-Mcnc     2.9 - 4.4 g/dL 4.5 (H)  Alpha 1     0.0 - 0.4 g/dL 0.2  Alpha2 Glob SerPl Elph-Mcnc     0.4 - 1.0 g/dL 0.8  B-Globulin SerPl Elph-Mcnc     0.7 - 1.3 g/dL 0.9  Gamma Glob SerPl Elph-Mcnc     0.4 - 1.8 g/dL 2.6 (H)  M Protein SerPl Elph-Mcnc     Not Observed g/dL 2.1 (H)  Globulin, Total     2.2 - 3.9 g/dL 4.5 (H)  Albumin/Glob SerPl     0.7 - 1.7 1.1  IFE 1      Comment  Please Note (HCV):      Comment    Lab Results  Component Value Date   IRON 45 06/02/2017   TIBC 233 06/02/2017   IRONPCTSAT 19 (L) 06/02/2017   (Iron and TIBC)  Lab Results   Component Value Date   FERRITIN 308 08/26/2017           RADIOGRAPHIC STUDIES: I have personally reviewed the radiological images as listed and agreed with the findings in the report.  Bone Marrow Biopsy 12/24/2016 (Accession [id number])  Diagnosis Bone Marrow, Aspirate,Biopsy, and Clot, left iliac crest - HYPERCELLULAR BONE  MARROW FOR AGE WITH PLASMA CELL NEOPLASM. - SEE COMMENT. PERIPHERAL BLOOD: - NORMOCYTIC-NORMOCHROMIC ANEMIA. - LEUKOPENIA.  DG Bone Survey Met (Accession [id number]) (Order [id number])  Imaging  Date: 11/28/2016 Department: [facility]-RADIOLOGY[facility]STIC Released By: Pricilla Riffle Authorizing: Brunetta Genera, MD  Exam Information   Status Exam Begun  Exam Ended   Final [99] 11/28/2016 11:59 AM 11/28/2016 12:36 PM  PACS Images   Show images for DG Bone Survey Met  Study Result   CLINICAL DATA:  Monoclonal gammopathy of unknown significance. History of squamous cell carcinoma excision, basal cell carcinoma excision.  EXAM: METASTATIC BONE SURVEY  COMPARISON:  CT neck, chest, abdomen and pelvis from 10/29/16, MRI of the head from 06/01/2016, CXR 10/27/2016, lumbar spine radiographs 06/24/2016  FINDINGS: Lateral skull: Small occipital lucency which may represent a normal arachnoid granulation posteriorly in the occiput. No definite lytic abnormality.  Cervical spine AP and lateral: Disc space narrowing at C2-3, and from C4 through C7. C4-5, C5-6 and C6-7 uncovertebral joint spurring bilaterally. No lytic abnormality.  Thoracic spine AP and lateral: T8-9 right-sided osteophytes and to a lesser degree T9-10. No lytic abnormality. Slight multilevel lumbar thoracic disc space narrowing likely degenerative.  Lumbar spine AP and lateral: Disc space narrowing at L4-5 and to a greater extent L5-S1. No lytic disease. L3 through S1 facet sclerosis.  AP pelvis: Negative for lytic disease.  Bilateral upper and lower  extremities shoulders through wrist and from the hips through ankle: Negative for lytic disease. Joint space narrowing of the femorotibial compartments both knees. Subchondral cyst of the right patella.  CXR: Clear lungs. Cardiac implantable monitoring device projects over the left heart. Aortic atherosclerosis. No lytic disease.  IMPRESSION: No findings suspicious for lytic disease.   Electronically Signed   By: Ashley Royalty M.D.   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 (12/2016) CYtogenetics and Myeloma FISH panel.- trisomy 11 Patient has normocytic anemia without any other clear etiology. (>2g/dl lower that lower limit of normal which is 13) which per criteria would place this in the Multiple myeloma category as opposed to Smoldering Multiple myeloma (Borderline criterion)  No overt renal failure or hypercalcemia at this time No focal bone pains though he has significant chronic back pain related to degenerative disc disease. Bone survey shows no overt lytic lesions.  PLAN -patient has no new clinical concerns or symptoms. -after 3 cycles of Ninlaro/Dexamethasone - we have not even had partial response and the M spike has plateaued. - Last visit M Protein levels had plateau at 2.2 and today it is not much changed at 2.1 -we rediscussed possible treatment options and asked Mr. Nawabi and he has chosen to proceed with adding Revlimid. -The pt has been taking Xarelto (for Afib) which reduces the risk of blood clots from any of the treatments. -Discussed the possible side effects of Revlimid -Discussed pt labwork today; Hgb normalized, blood counts increased; kidney counts stable. Lambda free light chains have come down from 163 to 133. -3 weeks on Revlimid 69m followed by 1 week off. One pill each day for 3 weeks. Prescription written and  given to our oral ctx pharmacist -Order Zofran for prn use   2) Borderline low B12 levels 299 --- on replacement - now improved to 482 -continue replacement.  3) recurrent CVA - thought to  be related to small vessel disease as per neurology. Has a small PFO which could be an additional risk factor as well as his afib  4) Newly diagnosed P afib Plan - on Xarelto per cardiology.  5) Patient Active Problem List   Diagnosis Date Noted  . History of loop recorder 06/12/2017  . Cryptogenic stroke ([place]) 10/28/2016  . Gait abnormality   . History of CVA (cerebrovascular accident)   . History of TIA (transient ischemic attack)   . Benign essential HTN   . Paroxysmal SVT (supraventricular tachycardia) ([place])   . Acute blood loss anemia   . Acute ischemic stroke ([place])   . CVA (cerebral vascular accident) ([place]) 10/26/2016  . History of recent stroke 06/06/2016  . 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 TIA (transient ischemic attack) 06/02/2016  . Numbness 06/01/2016  . Right arm numbness 06/01/2016  . Atherosclerosis of native coronary artery of native heart without angina pectoris 03/20/2016  . Hypertension   . GERD (gastroesophageal reflux disease)   . Gout   . S/P RF ablation operation for arrhythmia 12/13/2014  . Hyperlipidemia 12/13/2014  . Controlled type 2 diabetes mellitus with complication, without long-term current use of insulin ([place]) 12/07/2014    PLAN -Continue f/u with PCP (blood sugars) -labs steady as of today  6) Lower extremity discomfort - improved. Likely Discogenic pain -[place] on 1/19 revealed no blood clot -DG Femur Min 2 Views Right completed on 01/10/18 with results revealing No acute osseous abnormality identified about the right femur. Right hip joint space remains normal. -Per his bone study, Degenerative discs and narrow nerve root opening at L4 and L5 could be causing this discomfort; could be a pinched nerve. -MRI on lower back and nerve conduction  study to r/o or confirm nerve pain, which would guide our choice of a nerve related medication. -We will prescribe tramadol for his pain -Pt has established care with orthopedist and PT.  RTC with dr Irene Limbo in 4 weeks with labs    All of the patients questions were answered with apparent satisfaction. The patient knows to call the clinic with any problems, questions or concerns.  . The total time spent in the appointment was 25 minutes and more than 50% was on counseling and direct patient cares.     Sullivan Lone MD [place] AAHIVMS [facility] Hematology/Oncology Physician [place]  (Office):       [phone] (Work cell):  [phone] (Fax):           [phone]  This document serves as a record of services personally performed by Sullivan Lone, MD. It was created on his behalf by Baldwin [place], a trained medical scribe. The creation of this record is based on the scribe's personal observations and the provider's statements to them.   .I have reviewed the above documentation for accuracy and completeness, and I agree with the above. Brunetta Genera MD MS

## 2018-02-12 ENCOUNTER — Other Ambulatory Visit: Payer: Medicare Other

## 2018-02-12 ENCOUNTER — Encounter: Payer: Self-pay | Admitting: Hematology

## 2018-02-12 ENCOUNTER — Telehealth: Payer: Self-pay | Admitting: Pharmacy Technician

## 2018-02-12 ENCOUNTER — Other Ambulatory Visit: Payer: Self-pay

## 2018-02-12 ENCOUNTER — Inpatient Hospital Stay (HOSPITAL_BASED_OUTPATIENT_CLINIC_OR_DEPARTMENT_OTHER): Payer: Medicare Other | Admitting: Hematology

## 2018-02-12 ENCOUNTER — Telehealth: Payer: Self-pay | Admitting: Hematology

## 2018-02-12 VITALS — BP 110/68 | HR 80 | Temp 97.8°F | Resp 18 | Ht 68.0 in | Wt 166.4 lb

## 2018-02-12 DIAGNOSIS — E119 Type 2 diabetes mellitus without complications: Secondary | ICD-10-CM

## 2018-02-12 DIAGNOSIS — E538 Deficiency of other specified B group vitamins: Secondary | ICD-10-CM

## 2018-02-12 DIAGNOSIS — C9000 Multiple myeloma not having achieved remission: Secondary | ICD-10-CM

## 2018-02-12 DIAGNOSIS — I10 Essential (primary) hypertension: Secondary | ICD-10-CM

## 2018-02-12 DIAGNOSIS — Z7901 Long term (current) use of anticoagulants: Secondary | ICD-10-CM

## 2018-02-12 DIAGNOSIS — D649 Anemia, unspecified: Secondary | ICD-10-CM

## 2018-02-12 DIAGNOSIS — I480 Paroxysmal atrial fibrillation: Secondary | ICD-10-CM

## 2018-02-12 LAB — MULTIPLE MYELOMA PANEL, SERUM
ALBUMIN SERPL ELPH-MCNC: 4.5 g/dL — AB (ref 2.9–4.4)
ALPHA 1: 0.2 g/dL (ref 0.0–0.4)
ALPHA2 GLOB SERPL ELPH-MCNC: 0.8 g/dL (ref 0.4–1.0)
Albumin/Glob SerPl: 1.1 (ref 0.7–1.7)
B-GLOBULIN SERPL ELPH-MCNC: 0.9 g/dL (ref 0.7–1.3)
Gamma Glob SerPl Elph-Mcnc: 2.6 g/dL — ABNORMAL HIGH (ref 0.4–1.8)
Globulin, Total: 4.5 g/dL — ABNORMAL HIGH (ref 2.2–3.9)
IGG (IMMUNOGLOBIN G), SERUM: 2957 mg/dL — AB (ref 700–1600)
IGM (IMMUNOGLOBULIN M), SRM: 26 mg/dL (ref 15–143)
IgA: 37 mg/dL — ABNORMAL LOW (ref 61–437)
M PROTEIN SERPL ELPH-MCNC: 2.1 g/dL — AB
TOTAL PROTEIN ELP: 9 g/dL — AB (ref 6.0–8.5)

## 2018-02-12 MED ORDER — LENALIDOMIDE 25 MG PO CAPS: 25.0000 mg | ORAL_CAPSULE | Freq: Every day | ORAL | 2 refills | Status: DC

## 2018-02-12 MED FILL — TELMISARTAN-HCTZ 80-12.5 MG: 80-12.5 | 30 days supply | Qty: 30 | Fill #0

## 2018-02-12 NOTE — Telephone Encounter (Signed)
Appointments scheduled AVS/ Calendar printed per 2/21

## 2018-02-12 NOTE — Telephone Encounter (Signed)
Oral Oncology Patient Advocate Encounter  Prior authorization for Revlimid is required.  PA submitted on CoverMyMeds Key [id number] Status is pending  Oral Oncology Clinic will continue to follow.  Justin [name]. [name], [place] Patient [place] [phone] 02/12/2018 2:02 PM

## 2018-02-13 DIAGNOSIS — M5416 Radiculopathy, lumbar region: Secondary | ICD-10-CM | POA: Diagnosis not present

## 2018-02-13 DIAGNOSIS — M5136 Other intervertebral disc degeneration, lumbar region: Secondary | ICD-10-CM | POA: Diagnosis not present

## 2018-02-16 ENCOUNTER — Other Ambulatory Visit: Payer: Self-pay

## 2018-02-16 ENCOUNTER — Telehealth: Payer: Self-pay | Admitting: Pharmacist

## 2018-02-16 DIAGNOSIS — C9000 Multiple myeloma not having achieved remission: Secondary | ICD-10-CM

## 2018-02-16 DIAGNOSIS — M5416 Radiculopathy, lumbar region: Secondary | ICD-10-CM | POA: Diagnosis not present

## 2018-02-16 MED ORDER — LENALIDOMIDE 15 MG PO CAPS: 15.0000 mg | ORAL_CAPSULE | Freq: Every day | ORAL | 0 refills | Status: DC

## 2018-02-16 NOTE — Telephone Encounter (Signed)
Oral Chemotherapy Pharmacist Encounter   I spoke with patient for overview of: Revlimid.   Counseled patient on administration, dosing, side effects, monitoring, drug-food interactions, safe handling, storage, and disposal.  Patient will take Revlimid 15mg  capsules, 1 capsule by mouth once daily, without regard to food, with a full glass of water. Revlimid will be given 21 days on, 7 days off, repeat every 28 days.  Patient continues on dexamethasone 4mg  tablets, 5 tablets (20mg ) by mouth once weekly with breakfast.  Patient continues on Ninlaro 4mg  capsules, 1 capsule by mouth once daily on an empty stomach, 1 hour before or 2 hours after food. Current Ninlaro cycle started on 02/12/18  Revlimid start date: TBD, week of 02/16/18  Patient will take Revlimid daily until 03/04/18. He will start his week off on 03/05/18, to coincide with week off of Ninlaro. He will restart his next cycle with both agents on 03/12/18  Side effects of Revlimid include but not limited to: nausea, constipation, diarrhea, abdominal pain, rash, fatigue, drug fever, and decreased blood counts.    Reviewed with patient importance of keeping a medication schedule and plan for any missed doses.  Justin [name] voiced understanding and appreciation.   All questions answered. Medication reconciliation performed and medication/allergy list updated.  Patient remains on acyclovir. Patient counseled on importance of Xarelto for VTE prophylaxis.  Patient provided update about [facility] (ph: [phone]) as dispensing pharmacy for Revlimid and provided phone number.  Patient knows to call the office with questions or concerns. Oral Oncology Clinic will continue to follow.  Justin [name], PharmD, BCPS, BCOP 02/16/2018    1:53 PM Oral Oncology Clinic [phone]

## 2018-02-16 NOTE — Telephone Encounter (Signed)
Oral Oncology Pharmacist Encounter  Revlimid prescription e-scribed to [facility]. I called to update patient. He is about to go to PT appointment and will call me back later.  Oral Oncology Clinic will continue to follow.  Johny Drilling, PharmD, BCPS, BCOP 02/16/2018 10:12 AM Oral Oncology Clinic [phone]

## 2018-02-16 NOTE — Telephone Encounter (Signed)
Oral Oncology Pharmacist Encounter  Received new prescription for Revlimid (lenalidomide) for the treatment of multiple myeloma in conjunction with Ninlaro and dexamethasone, planned duration until disease progression or unacceptable toxicity.  Labs from 02/10/18 assessed, OK for treatment. SCr=1.26, est CrCl ~50 Revlimid prescription written for 2m daily, manufacturer recommends dose reduction to 152mdaily, with option to increase to 1541maily if tolerated for CrCl 30-60 mL/min. This will be discussed with MD. Noted pltc= 137k, this will continue to be monitored  Current medication list in Epic reviewed, no significant DDIs with Revlimid identified. Patient remains on therapy with Xarelto, this will be adequate thromboprophylaxis for Revlimid and dexamethasone combination  Prescription will be e-scribed to [facility] for benefits analysis and approval as Revlimid is a limited distribution medication and patient has prescription medication coverage through FEP.  Oral Oncology Clinic will continue to follow for insurance authorization, copayment issues, initial counseling and start date.  JesJohny DrillingharmD, BCPS, BCOP 02/16/2018 8:45 AM Oral Oncology Clinic [phone]

## 2018-02-17 LAB — CUP PACEART REMOTE DEVICE CHECK
Implantable Pulse Generator Implant Date: 20170613
MDC IDC SESS DTM: 20190204170818

## 2018-02-17 NOTE — Telephone Encounter (Signed)
Oral Oncology Patient Advocate Encounter  Prior Authorization for Revlimid has been approved.    PA# [id number]  Effective dates: 01/13/2018 through 12/22/2098  Oral Oncology Clinic will continue to follow.   Justin [name]. [name], [place] Patient [place] [phone] 02/17/2018 4:35 PM

## 2018-02-19 ENCOUNTER — Telehealth: Payer: Self-pay | Admitting: Pharmacy Technician

## 2018-02-19 ENCOUNTER — Telehealth: Payer: Self-pay | Admitting: *Deleted

## 2018-02-19 NOTE — Telephone Encounter (Signed)
12:44:  "Revlimid I was to begin today has not been delivered.  Call me."

## 2018-02-19 NOTE — Telephone Encounter (Signed)
Oral Oncology Patient Advocate Encounter  Received a forwarded voicemail from the patient stating that he had not yet received his initial shipment of Revlimid.    I contacted [facility] (where the prescription was initially sent).  They had experienced difficulty in filling the prescription due to their pharmacy being out of network.  They had made several attempts to reach the patient to discuss but were unsuccessful.  The representative was able to tell me that [facility] was the preferred pharmacy for his plan.    I have faxed the prescription and all demographic information to [facility] at [phone].   I updated [name] and provided him with the phone number to [facility] [phone]).  I instructed him to reach out to them tomorrow afternoon if he hasn't heard from them by then.  I did emphasize that they are required to speak with him before they are permitted to ship his medication.   [name]. [name], [name] [phone] 02/19/2018 1:29 PM

## 2018-02-24 NOTE — Telephone Encounter (Signed)
"  Unable to reach patient tor his initial Revlimid shipment.  We called [phone] and [phone], phones just ring and ring with no answer, voicemail or he just may not be answering our calls.  Confirm numbers and provide additional numbers.  He can call us at [phone]."  Spoke with patient.  Provided above information.  Answered question about "what to expect with the call to pharmacy, what number call originated from.  I keep my phone on me at all times.  My wife's number is [phone].  Erase from your records the home number "[phone]"  which is never answered, used for security system."    Home number not listed in EMR.  Confirmed wife's mobile number to clarify number on file is incorrect.  Corrected telephone information as requested.

## 2018-02-25 ENCOUNTER — Encounter: Payer: Self-pay | Admitting: Physician Assistant

## 2018-02-25 DIAGNOSIS — M5416 Radiculopathy, lumbar region: Secondary | ICD-10-CM | POA: Diagnosis not present

## 2018-02-26 ENCOUNTER — Telehealth: Payer: Self-pay | Admitting: Pharmacist

## 2018-02-26 NOTE — Telephone Encounter (Signed)
Oral Oncology Pharmacist Encounter  Received call from patient with information that he had received his first Revlimid fill yesterday 02/25/2018. Current Ninlaro cycle started on 02/12/18  Patient to take day 15 Ninlaro today 02/26/2018. He will also start his Revlimid today. As previously discussed during Revlimid initial counseling session patient will still take his week off of treatment from 03/05/18-03/11/18.  Next Ninlaro cycle will start on 03/12/2018 as planned. Patient will start next cycle of Revlimid on that day as well.  Patient plans to take his dexamethasone with breakfast weekly, his Ninlaro on an empty stomach 2 hours after lunch, and his Revlimid at bedtime.  Patient knows to call the office with any additional questions or concerns.  Oral Oncology Clinic will continue to follow.  Johny Drilling, PharmD, BCPS, BCOP 02/26/2018 9:03 AM Oral Oncology Clinic [phone]

## 2018-03-01 NOTE — Progress Notes (Signed)
Cardiology Office Note Date:  03/04/2018  Patient ID:  Justin [name], DOB 06/24/1941, MRN [id number] PCP:  Mayra Neer, MD  Cardiologist:  Dr. Debara Pickett Electrophysiologist; Dr. Rayann Heman    Chief Complaint:  F/u on new a/c  History of Present Illness: Justin [name] is a 77 y.o. male with history of SVT (ablated), DM, HLD, CVA/TIA w/u noted small bidirectional PFO by TEE, no thrombus, no DVT and an ILR implanted June 2017, most recently Dr. 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  He comes in today to be seen for Dr. Rayann Heman.  He has been found via his  ILR to have AF.  Dec 2018 was recommeded to stop ASA and start Xarelto, though trhe patient rasied concerns with taking the [facility] being on Ninlaro for his myeloma, Dr. Rayann Heman recommended he f/u with Dr. Irene Limbo, or to reach out to Dr. Irene Limbo to clear plans first.  I saw him in Dec, in communication with Dr. Irene Limbo, who cleared him for Xarelto, this was started.  He reported at that visit he was feeling well, no real cardiac awareness at all, though since he was called seemed to feel like he was paying more attention and has felt fleetingly a vague "something", unable to describe further.  No CP or SOB, no dizziness, near syncope or syncope.  Said all-in-all he felt pretty healthy and good.  He was doing no formal or regular exercise but said he stays active and able to do his ADL without difficulty.  He inquired if his ca medication may have contributed to the development of AFib and was referred to his oncologist to discuss this.  He was planned to return for early f/u given no start on a/c.  He is tolerating the Xarelto well./  NO intolerances, no bleeding or signs of bleeding.  He sees his oncologist very routinely, had a therapy change since his last visit here and sees him again tomorrow.  He feels like he is doing well.  Doesn't like being on so many medicines and worries about interactions and that  they are not being monitored often enough.  He denies any symptoms of palpitations, does not think he has had any AFib.  No CP, palpitations.  Mentions he had eaten [place] and got bloated/GI fullness, but no CP, no dizziness, near syncope or syncope.   Device information: MDT ILR, implanted 06/04/16, cryptogenic stroke   Past Medical History:  Diagnosis Date  . Anemia   . Arthritis   . 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 Diabetes mellitus without complication ([place])   . GERD (gastroesophageal reflux disease)   . Gout   . History of loop recorder   . Hypertension   . Left leg weakness   . Paroxysmal SVT (supraventricular tachycardia) ([place])    a. s/p RFCA on 05/01/15  . Stroke [facility])    TIA and mild stroke    Past Surgical History:  Procedure Laterality Date  . ATRIAL FIBRILLATION ABLATION     Dr. Lovena Le  . BASAL CELL CARCINOMA EXCISION     off of back  . ELECTROPHYSIOLOGIC STUDY N/A 05/01/2015   Procedure: SVT Ablation;  Surgeon: Evans Lance, MD;  Location: [place] [facility];  Service: Cardiovascular;  Laterality: N/A;  . EP IMPLANTABLE DEVICE N/A 06/04/2016   Procedure: Loop Recorder Insertion;  Surgeon: Thompson Grayer, MD;  Location: [place] [facility];  Service: Cardiovascular;  Laterality: N/A;  . INGUINAL HERNIA REPAIR Bilateral 06/16/2017   Procedure: LAPAROSCOPIC BILATERAL INGUINAL HERNIA REPAIR;  Surgeon: Clovis Riley, MD;  Location: [place];  Service: General;  Laterality: Bilateral;  . INSERTION OF MESH Bilateral 06/16/2017   Procedure: INSERTION OF MESH;  Surgeon: Clovis Riley, MD;  Location: [place];  Service: General;  Laterality: Bilateral;  . SQUAMOUS CELL CARCINOMA EXCISION    . TEE WITHOUT CARDIOVERSION N/A 06/04/2016   Procedure: TRANSESOPHAGEAL ECHOCARDIOGRAM (TEE);  Surgeon: Pixie Casino, MD;  Location: [facility] ENDOSCOPY;  Service: Cardiovascular;  Laterality: N/A;    Current Outpatient Medications  Medication Sig Dispense  Refill  . acyclovir (ZOVIRAX) 400 MG tablet Take 1 tablet (400 mg total) by mouth daily. 90 tablet 0  . allopurinol (ZYLOPRIM) 300 MG tablet Take 300 mg by mouth daily.     [name] Kitchen atorvastatin (LIPITOR) 40 MG tablet Take 1 tablet (40 mg total) by mouth every morning. 90 tablet 3  . cyanocobalamin 1000 MCG tablet Take 1,000 mcg by mouth daily.    [name] Kitchen dexamethasone (DECADRON) 4 MG tablet Take 5 tablets (81m) by mouth once weekly. Take on days 1, 8, 15, and 21 of every 28day cycle 60 tablet 0  . fluorouracil (EFUDEX) 5 % cream Apply 1 application topically daily as needed (for skin cancer prevention).   0  . glucose monitoring kit (FREESTYLE) monitoring kit 1 each by Does not apply route as needed for other.    . ixazomib citrate (NINLARO) 4 MG capsule Take 1 capsule by mouth once weekly on days 1, 8, and 15, of each 28 day cycle. Take on an empty stomach 1hr before or 2hrs after food. 3 capsule 2  . lenalidomide (REVLIMID) 15 MG capsule Take 1 capsule (15 mg total) by mouth daily. Take for 21 days on, 7 days off, repeat every 28 days. Authorization ##[id number] 21 capsule 0  . metFORMIN (GLUCOPHAGE) 1000 MG tablet Take 1,000 mg by mouth daily with breakfast.    . nitroGLYCERIN (NITROSTAT) 0.4 MG SL tablet Place 1 tablet (0.4 mg total) under the tongue every 5 (five) minutes as needed for chest pain. 25 tablet 3  . omeprazole (PRILOSEC) 20 MG capsule Take 20 mg by mouth every morning.     . rivaroxaban (XARELTO) 20 MG TABS tablet Take 1 tablet (20 mg total) by mouth daily with supper. 90 tablet 2  . telmisartan-hydrochlorothiazide (MICARDIS HCT) 80-12.5 MG tablet Take 1 tablet by mouth daily. 90 tablet 3  . traMADol (ULTRAM) 50 MG tablet Take 1-2 tablets (50-100 mg total) by mouth every 6 (six) hours as needed for moderate pain or severe pain. 50 tablet 0   No current facility-administered medications for this visit.     Allergies:   No known allergies   Social History:  The patient  reports that   has never smoked. he has never used smokeless tobacco. He reports that he does not drink alcohol or use drugs.   Family History:  The patient's family history includes Alzheimer's disease in his father; Down syndrome in his daughter; Heart failure in his father; Hypertension in his mother; Stroke in his mother.  ROS:  Please see the history of present illness.  All other systems are reviewed and otherwise negative.   PHYSICAL EXAM:  VS:  BP 118/64   Pulse 91   Ht _0  (1.727 m)   Wt 170 lb (77.1 kg)   BMI 25.85 kg/m  BMI: Body mass  index is 25.85 kg/m. Well nourished, well developed, in no acute distress  HEENT: normocephalic, atraumatic  Neck: no JVD, carotid bruits or masses Cardiac: RRR; no significant murmurs, no rubs, or gallops Lungs:  CTA b/l, no wheezing, rhonchi or rales  Abd: soft, nontender MS: no deformity or atrophy Ext: no edema  Skin: warm and dry, no rash Neuro:  No gross deficits appreciated Psych: euthymic mood, full affect  ILR site is stable, no tethering or discomfort   EKG:  Not done today ILR interrogation done today and reviewed by myself: R waves 0.33, no recurrent AFib, SR today   10/28/17: TTE Study Conclusions - Left ventricle: The cavity size was normal. Wall thickness was   normal. Systolic function was normal. The estimated ejection   fraction was in the range of 60% to 65%. Wall motion was normal;   there were no regional wall motion abnormalities. Doppler   parameters are consistent with abnormal left ventricular   relaxation (grade 1 diastolic dysfunction). - Mitral valve: Calcified annulus. Impressions: - No cardiac source of emboli was indentified.   Recent Labs: 01/12/2018: Hemoglobin 11.8 03/02/2018: ALT 9; BUN 23; Creatinine 1.14; Platelet Count 80; Potassium 4.1; Sodium 138  No results found for requested labs within last 8760 hours.   Estimated Creatinine Clearance: 51.7 mL/min (by C-G formula based on SCr of 1.14 mg/dL).    Wt Readings from Last 3 Encounters:  03/04/18 170 lb (77.1 kg)  02/12/18 166 lb 6.4 oz (75.5 kg)  01/21/18 169 lb 4.8 oz (76.8 kg)     Other studies reviewed: Additional studies/records reviewed today include: summarized above  ASSESSMENT AND PLAN:  1. Paroxysmal Afib     CHA2DS2Vasc is 5, on Xarelto     AFib burden is very low, do not think we need to add on any kind of rate/rhythm controlling agents at this time  2. HTN     Looks OK     No changes for now  3. Hyperlipidemia     monitored and managed with his PMD    Disposition: He follow closely with hematology, sees him tomorrow.  He has Dr. Debara Pickett in June, will plan for EP in 1 year, sooner if needed.  Current medicines are reviewed at length with the patient today.  The patient did not have any concerns regarding medicines.  Venetia Night, PA-C 03/04/2018 11:01 AM     [facility] [address] [place] [address] [phone] (office)  [phone] (fax)

## 2018-03-02 ENCOUNTER — Ambulatory Visit (INDEPENDENT_AMBULATORY_CARE_PROVIDER_SITE_OTHER): Payer: Medicare Other | Admitting: *Deleted

## 2018-03-02 ENCOUNTER — Inpatient Hospital Stay: Payer: Medicare Other | Attending: Hematology

## 2018-03-02 DIAGNOSIS — I10 Essential (primary) hypertension: Secondary | ICD-10-CM | POA: Diagnosis not present

## 2018-03-02 DIAGNOSIS — M549 Dorsalgia, unspecified: Secondary | ICD-10-CM | POA: Insufficient documentation

## 2018-03-02 DIAGNOSIS — D696 Thrombocytopenia, unspecified: Secondary | ICD-10-CM | POA: Insufficient documentation

## 2018-03-02 DIAGNOSIS — Z7901 Long term (current) use of anticoagulants: Secondary | ICD-10-CM | POA: Diagnosis not present

## 2018-03-02 DIAGNOSIS — I639 Cerebral infarction, unspecified: Secondary | ICD-10-CM

## 2018-03-02 DIAGNOSIS — C9000 Multiple myeloma not having achieved remission: Secondary | ICD-10-CM | POA: Insufficient documentation

## 2018-03-02 DIAGNOSIS — I480 Paroxysmal atrial fibrillation: Secondary | ICD-10-CM | POA: Diagnosis not present

## 2018-03-02 DIAGNOSIS — M79606 Pain in leg, unspecified: Secondary | ICD-10-CM | POA: Insufficient documentation

## 2018-03-02 DIAGNOSIS — E119 Type 2 diabetes mellitus without complications: Secondary | ICD-10-CM | POA: Insufficient documentation

## 2018-03-02 DIAGNOSIS — D649 Anemia, unspecified: Secondary | ICD-10-CM | POA: Diagnosis not present

## 2018-03-02 DIAGNOSIS — Z8673 Personal history of transient ischemic attack (TIA), and cerebral infarction without residual deficits: Secondary | ICD-10-CM | POA: Diagnosis not present

## 2018-03-02 DIAGNOSIS — E538 Deficiency of other specified B group vitamins: Secondary | ICD-10-CM | POA: Diagnosis not present

## 2018-03-02 LAB — CBC WITH DIFFERENTIAL (CANCER CENTER ONLY)
Basophils Absolute: 0 10*3/uL (ref 0.0–0.1)
Basophils Relative: 0 %
EOS ABS: 0.1 10*3/uL (ref 0.0–0.5)
EOS PCT: 1 %
HCT: 33.4 % — ABNORMAL LOW (ref 38.4–49.9)
Hemoglobin: 10.8 g/dL — ABNORMAL LOW (ref 13.0–17.1)
LYMPHS ABS: 0.7 10*3/uL — AB (ref 0.9–3.3)
LYMPHS PCT: 11 %
MCH: 30.3 pg (ref 27.2–33.4)
MCHC: 32.3 g/dL (ref 32.0–36.0)
MCV: 93.8 fL (ref 79.3–98.0)
MONO ABS: 0.2 10*3/uL (ref 0.1–0.9)
Monocytes Relative: 4 %
Neutro Abs: 4.8 10*3/uL (ref 1.5–6.5)
Neutrophils Relative %: 84 %
PLATELETS: 80 10*3/uL — AB (ref 140–400)
RBC: 3.56 MIL/uL — ABNORMAL LOW (ref 4.20–5.82)
RDW: 15.6 % — AB (ref 11.0–14.6)
WBC Count: 5.8 10*3/uL (ref 4.0–10.3)

## 2018-03-02 LAB — CMP (CANCER CENTER ONLY)
ALBUMIN: 4.1 g/dL (ref 3.5–5.0)
ALT: 9 U/L (ref 0–55)
AST: 16 U/L (ref 5–34)
Alkaline Phosphatase: 50 U/L (ref 40–150)
Anion gap: 7 (ref 3–11)
BUN: 23 mg/dL (ref 7–26)
CO2: 27 mmol/L (ref 22–29)
CREATININE: 1.14 mg/dL (ref 0.70–1.30)
Calcium: 9.4 mg/dL (ref 8.4–10.4)
Chloride: 104 mmol/L (ref 98–109)
GFR, Est AFR Am: >60 mL/min (ref >60)
GFR, Estimated: 60 mL/min — ABNORMAL LOW (ref >60)
GLUCOSE: 142 mg/dL — AB (ref 70–140)
POTASSIUM: 4.1 mmol/L (ref 3.5–5.1)
Sodium: 138 mmol/L (ref 136–145)
TOTAL PROTEIN: 8.4 g/dL — AB (ref 6.4–8.3)
Total Bilirubin: 0.4 mg/dL (ref 0.2–1.2)

## 2018-03-02 LAB — RETICULOCYTES
RBC.: 3.56 MIL/uL — ABNORMAL LOW (ref 4.20–5.82)
RETIC CT PCT: 0.6 % — AB (ref 0.8–1.8)
Retic Count, Absolute: 21.4 10*3/uL — ABNORMAL LOW (ref 34.8–93.9)

## 2018-03-02 NOTE — Progress Notes (Signed)
Carelink Summary Report / Loop Recorder 

## 2018-03-03 LAB — KAPPA/LAMBDA LIGHT CHAINS
KAPPA, LAMDA LIGHT CHAIN RATIO: 0.17 — AB (ref 0.26–1.65)
Kappa free light chain: 17.9 mg/L (ref 3.3–19.4)
LAMDA FREE LIGHT CHAINS: 104.6 mg/L — AB (ref 5.7–26.3)

## 2018-03-04 ENCOUNTER — Telehealth: Payer: Self-pay

## 2018-03-04 ENCOUNTER — Ambulatory Visit (INDEPENDENT_AMBULATORY_CARE_PROVIDER_SITE_OTHER): Payer: Medicare Other | Admitting: Physician Assistant

## 2018-03-04 VITALS — BP 118/64 | HR 91 | Ht 68.0 in | Wt 170.0 lb

## 2018-03-04 DIAGNOSIS — I639 Cerebral infarction, unspecified: Secondary | ICD-10-CM

## 2018-03-04 DIAGNOSIS — I10 Essential (primary) hypertension: Secondary | ICD-10-CM

## 2018-03-04 DIAGNOSIS — I480 Paroxysmal atrial fibrillation: Secondary | ICD-10-CM

## 2018-03-04 DIAGNOSIS — E7849 Other hyperlipidemia: Secondary | ICD-10-CM

## 2018-03-04 DIAGNOSIS — M5416 Radiculopathy, lumbar region: Secondary | ICD-10-CM | POA: Diagnosis not present

## 2018-03-04 MED ORDER — RIVAROXABAN 20 MG PO TABS: 20.0000 mg | ORAL_TABLET | Freq: Every day | ORAL | 2 refills | Status: DC

## 2018-03-04 NOTE — Telephone Encounter (Signed)
Received fax request for refill from [facility] for revlimid. Last prescription sent 02/16/18. New authorization may not be obtained until 03/14/18. Not yet time to refill revlimid.

## 2018-03-04 NOTE — Progress Notes (Signed)
[name] Kitchen    HEMATOLOGY/ONCOLOGY CLINIC NOTE  Date of Service: 03/05/18   Patient Care Team: Mayra Neer, MD as PCP - General (Family Medicine)  CHIEF COMPLAINTS/:   F/u for continued mx of Myeloma  HISTORY OF PRESENTING ILLNESS:   Justin [name] is a wonderful 77 y.o. 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  Patient has a history of hypertension, diabetes, GERD, gout, squamous cell skin cancers, elevated PSA, SVT status post ablation in May 2016, chronic back pain who has a history of recurrent CVA/TIA's. He apparently had a left hemispheric TIA June 2017 and has had recurrent) subcortical infarcts thought to be related to small vessel disease. He was noted to have a small PFO that was of questionable significance. He was initially on aspirin and then continued and aspirin + plavix for secondary stroke prevention. He follows with Dr. Antony Contras for his neurology cares.  An SPEP was done due to elevated total proteins of 8.9 and showed an M spike of 2 g/dL. He was also noted to have an elevated total protein of 8.6 in April 2017. He was referred to [place] for further evaluation of his M spike. Blood test did not reveal any hypercalcemia, no significant renal failure. He has been noted to have developed anemia since June the serum and his hemoglobin was 12.6 and is now down to the mid 10 range. He notes no focal bone pains at this time. Overt acute weight loss. No fevers no chills no night sweats.  CURRENT THERAPY: S/p 4 cycles of Ninlaro   INTERVAL HISTORY  Nochum Fenter presents to the office today for follow-up of his myeloma. The patient's last visit with [place] was on 02/12/18. The pt reports that he is doing well overall.   Of note since the patient's last visit, pt has had started his Revlimid and is on his seventh day today. He is continuing Ninlaro as well. He notes that since beginning treatment he has been able to increase  his hours of sleeping from 6 to 8 hours, to his satisfaction. He notes that he is tolerating the Revlimid very well. He continues to take his B12.   He notes that he has seen a specialist about his back pain which has indicated that nerve pinching is likely the cause. He notes that his back pain has worsened but he is attending PT.   He notes that he is doing well emotionally. He notes that his back pain is keeping him from walking as much as he wants to but he will continue going to PT.   He notes that he is running out of Metformin. Dr. Serita Grammes is his PCP at [facility].   Lab results today (03/02/18) of CBC, CMP, and Reticulocytes is as follows: all values are WNL except for RBC at 3.56, Hgb at 10.8, HCT at 33.4, RDW at 15.6, Platelets at 80k, Lymphs Abs at 0.7k, Glucose at 142, Total Protein at 8.4, Retic Ct Pct at 0.6%, Retic Ct Abs at 21.4. Kappa/lambda 03/02/18 shows Kappa WNL at 17.9 and Lambda elevated at 104.6, with K:L ratio low at 0.17.  MMP 03/02/18 is pending.   On review of systems, pt reports some drowsiness, back pain, and denies tingling, numbness, rashes, nausea, and any other symptoms.   MEDICAL HISTORY:  Past Medical History:  Diagnosis Date  . Anemia   . Arthritis   . 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 Diabetes mellitus without complication ([place])   . GERD (gastroesophageal reflux disease)   . Gout   . History of loop recorder   . Hypertension   . Left leg weakness   . Paroxysmal SVT (supraventricular tachycardia) ([place])    a. s/p RFCA on 05/01/15  . Stroke [facility])    TIA and mild stroke    SURGICAL HISTORY: Past Surgical History:  Procedure Laterality Date  . ATRIAL FIBRILLATION ABLATION     Dr. Lovena Le  . BASAL CELL CARCINOMA EXCISION     off of back  . ELECTROPHYSIOLOGIC STUDY N/A 05/01/2015   Procedure: SVT Ablation;  Surgeon: Evans Lance, MD;  Location: [place] [facility];  Service: Cardiovascular;  Laterality: N/A;   . EP IMPLANTABLE DEVICE N/A 06/04/2016   Procedure: Loop Recorder Insertion;  Surgeon: Thompson Grayer, MD;  Location: [place] [facility];  Service: Cardiovascular;  Laterality: N/A;  . INGUINAL HERNIA REPAIR Bilateral 06/16/2017   Procedure: LAPAROSCOPIC BILATERAL INGUINAL HERNIA REPAIR;  Surgeon: Clovis Riley, MD;  Location: [place];  Service: General;  Laterality: Bilateral;  . INSERTION OF MESH Bilateral 06/16/2017   Procedure: INSERTION OF MESH;  Surgeon: Clovis Riley, MD;  Location: [place];  Service: General;  Laterality: Bilateral;  . SQUAMOUS CELL CARCINOMA EXCISION    . TEE WITHOUT CARDIOVERSION N/A 06/04/2016   Procedure: TRANSESOPHAGEAL ECHOCARDIOGRAM (TEE);  Surgeon: Pixie Casino, MD;  Location[facility] ENDOSCOPY;  Service: Cardiovascular;  Laterality: N/A;    SOCIAL HISTORY: Social History   Socioeconomic History  . Marital status: Married    Spouse name: Not on file  . Number of children: Not on file  . Years of education: Not on file  . Highest education level: Not on file  Social Needs  . Financial resource strain: Not on file  . Food insecurity - worry: Not on file  . Food insecurity - inability: Not on file  . Transportation needs - medical: Not on file  . Transportation needs - non-medical: Not on file  Occupational History  . Not on file  Tobacco Use  . Smoking status: Never Smoker  . Smokeless tobacco: Never Used  Substance and Sexual Activity  . Alcohol use: No  . Drug use: No  . Sexual activity: Not on file  Other Topics Concern  . Not on file  Social History Narrative  . Not on file    FAMILY HISTORY: Family History  Problem Relation Age of Onset  . Stroke Mother   . Hypertension Mother   . Alzheimer's disease Father   . Heart failure Father   . Down syndrome Daughter     ALLERGIES:  is allergic to no known allergies.  MEDICATIONS:  Current Outpatient Medications  Medication Sig Dispense Refill  . acyclovir (ZOVIRAX) 400 MG tablet Take 1  tablet (400 mg total) by mouth daily. 90 tablet 0  . allopurinol (ZYLOPRIM) 300 MG tablet Take 300 mg by mouth daily.     [name] Kitchen atorvastatin (LIPITOR) 40 MG tablet Take 1 tablet (40 mg total) by mouth every morning. 90 tablet 3  . cyanocobalamin 1000 MCG tablet Take 1,000 mcg by mouth daily.    [name] Kitchen dexamethasone (DECADRON) 4 MG tablet Take 5 tablets (57m) by mouth once weekly. Take on days 1, 8, 15, and 21 of every 28day cycle 60 tablet 0  . fluorouracil (EFUDEX) 5 % cream Apply 1 application topically daily as needed (for skin  cancer prevention).   0  . glucose monitoring kit (FREESTYLE) monitoring kit 1 each by Does not apply route as needed for other.    . ixazomib citrate (NINLARO) 4 MG capsule Take 1 capsule by mouth once weekly on days 1, 8, and 15, of each 28 day cycle. Take on an empty stomach 1hr before or 2hrs after food. 3 capsule 2  . lenalidomide (REVLIMID) 15 MG capsule Take 1 capsule (15 mg total) by mouth daily. Take for 21 days on, 7 days off, repeat every 28 days. Authorization #[id number] 02/12/18 21 capsule 0  . metFORMIN (GLUCOPHAGE) 1000 MG tablet Take 1,000 mg by mouth daily with breakfast.    . nitroGLYCERIN (NITROSTAT) 0.4 MG SL tablet Place 1 tablet (0.4 mg total) under the tongue every 5 (five) minutes as needed for chest pain. 25 tablet 3  . omeprazole (PRILOSEC) 20 MG capsule Take 20 mg by mouth every morning.     . rivaroxaban (XARELTO) 20 MG TABS tablet Take 1 tablet (20 mg total) by mouth daily with supper. 90 tablet 2  . telmisartan-hydrochlorothiazide (MICARDIS HCT) 80-12.5 MG tablet Take 1 tablet by mouth daily. 90 tablet 3  . traMADol (ULTRAM) 50 MG tablet Take 1-2 tablets (50-100 mg total) by mouth every 6 (six) hours as needed for moderate pain or severe pain. 50 tablet 0   No current facility-administered medications for this visit.     REVIEW OF SYSTEMS:    .10 Point review of Systems was done is negative except as noted above.    PHYSICAL EXAMINATION:  ECOG  PERFORMANCE STATUS: 2 - Symptomatic, <50% confined to bed  . Vitals:   03/05/18 1058  BP: 117/61  Pulse: 88  Resp: 18  Temp: 99.5 F (37.5 C)  SpO2: 99%   Filed Weights   03/05/18 1058  Weight: 170 lb 3.2 oz (77.2 kg)   Body mass index is 25.88 kg/m.  [name] Kitchen GENERAL:alert, in no acute distress and comfortable SKIN: no acute rashes, no significant lesions EYES: conjunctiva are pink and non-injected, sclera anicteric OROPHARYNX: MMM, no exudates, no oropharyngeal erythema or ulceration NECK: supple, no JVD LYMPH:  no palpable lymphadenopathy in the cervical, axillary or inguinal regions LUNGS: clear to auscultation b/l with normal respiratory effort HEART: regular rate & rhythm ABDOMEN:  normoactive bowel sounds , non tender, not distended. Extremity: no pedal edema PSYCH: alert & oriented x 3 with fluent speech NEURO: no focal motor/sensory deficits  LABORATORY DATA:  I have reviewed the data as listed. CBC Latest Ref Rng & Units 03/02/2018 02/10/2018 01/12/2018  WBC 4.0 - 10.3 K/uL 5.8 4.0 3.8(L)  Hemoglobin 13.0 - 17.1 g/dL - - 11.8(L)  Hematocrit 38.4 - 49.9 % 33.4(L) 38.3(L) 36.5(L)  Platelets 140 - 400 K/uL 80(L) 137(L) 127(L)  hgb 10.8  CMP Latest Ref Rng & Units 03/02/2018 02/10/2018 01/12/2018  Glucose 70 - 140 mg/dL 142(H) 93 78  BUN 7 - 26 mg/dL 23 26 24   Creatinine 0.70 - 1.30 mg/dL 1.14 1.29 1.21  Sodium 136 - 145 mmol/L 138 138 138  Potassium 3.5 - 5.1 mmol/L 4.1 4.0 4.4  Chloride 98 - 109 mmol/L 104 102 102  CO2 22 - 29 mmol/L 27 27 28   Calcium 8.4 - 10.4 mg/dL 9.4 10.0 9.9  Total Protein 6.4 - 8.3 g/dL 8.4(H) 9.3(H) 9.1(H)  Total Bilirubin 0.2 - 1.2 mg/dL 0.4 0.5 0.4  Alkaline Phos 40 - 150 U/L 50 51 51  AST 5 - 34 U/L 16 24  17  ALT 0 - 55 U/L 9 9 9     Lab Results  Component Value Date   IRON 45 06/02/2017   TIBC 233 06/02/2017   IRONPCTSAT 19 (L) 06/02/2017   (Iron and TIBC)  Lab Results  Component Value Date   FERRITIN 308 08/26/2017            RADIOGRAPHIC STUDIES: I have personally reviewed the radiological images as listed and agreed with the findings in the report.  Bone Marrow Biopsy 12/24/2016 (Accession [id number])  Diagnosis Bone Marrow, Aspirate,Biopsy, and Clot, left iliac crest - HYPERCELLULAR BONE MARROW FOR AGE WITH PLASMA CELL NEOPLASM. - SEE COMMENT. PERIPHERAL BLOOD: - NORMOCYTIC-NORMOCHROMIC ANEMIA. - LEUKOPENIA.  DG Bone Survey Met (Accession [id number]) (Order [id number])  Imaging  Date: 11/28/2016 Department: [facility] Released By: Pricilla Riffle Authorizing: Brunetta Genera, MD  Exam Information   Status Exam Begun  Exam Ended   Final [99] 11/28/2016 11:59 AM 11/28/2016 12:36 PM  PACS Images   Show images for DG Bone Survey Met  Study Result   CLINICAL DATA:  Monoclonal gammopathy of unknown significance. History of squamous cell carcinoma excision, basal cell carcinoma excision.  EXAM: METASTATIC BONE SURVEY  COMPARISON:  CT neck, chest, abdomen and pelvis from 10/29/16, MRI of the head from 06/01/2016, CXR 10/27/2016, lumbar spine radiographs 06/24/2016  FINDINGS: Lateral skull: Small occipital lucency which may represent a normal arachnoid granulation posteriorly in the occiput. No definite lytic abnormality.  Cervical spine AP and lateral: Disc space narrowing at C2-3, and from C4 through C7. C4-5, C5-6 and C6-7 uncovertebral joint spurring bilaterally. No lytic abnormality.  Thoracic spine AP and lateral: T8-9 right-sided osteophytes and to a lesser degree T9-10. No lytic abnormality. Slight multilevel lumbar thoracic disc space narrowing likely degenerative.  Lumbar spine AP and lateral: Disc space narrowing at L4-5 and to a greater extent L5-S1. No lytic disease. L3 through S1 facet sclerosis.  AP pelvis: Negative for lytic disease.  Bilateral upper and lower extremities shoulders through wrist and from the  hips through ankle: Negative for lytic disease. Joint space narrowing of the femorotibial compartments both knees. Subchondral cyst of the right patella.  CXR: Clear lungs. Cardiac implantable monitoring device projects over the left heart. Aortic atherosclerosis. No lytic disease.  IMPRESSION: No findings suspicious for lytic disease.   Electronically Signed   By: Ashley Royalty M.D.   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 (12/2016) Peak M spike 2.6  CYtogenetics and Myeloma FISH panel.- trisomy 11 Patient has normocytic anemia without any other clear etiology. (>2g/dl lower that lower limit of normal which is 13) which per criteria would place this in the Multiple myeloma category as opposed to Smoldering Multiple myeloma (Borderline criterion)  No overt renal failure or hypercalcemia at this time No focal bone pains though he has significant chronic back pain related to degenerative disc disease. Bone survey shows no overt lytic lesions.  2) anemia and thrombocytopenia -- related to treatment (Ixazomib + Revlimid) PLAN -patient has no new clinical concerns or symptoms. -no prohibitive toxicities from treatment for myeloma at this time. -anemia/thrombocytopenia-- should improve in his week off now. -M spike down to 1.8, Serum lambda FLC --decreasing -continue Revlimid 42m 3 weeks on 1 wee off + Ninlaro D1,8,15 q28days. -The pt has been taking  Xarelto (for Afib) which reduces the risk of blood clots from any of the treatments. -Order Zofran for prn use  -Discussed pt labwork today; platelets at 80k, Lambda at 104.6, MMP is M spike 1.8 -Will see pt back in 3 weeks for continue monitor and mx -Encouraged pt to continue being well hydrated, eating well, and staying active.    2) Borderline low B12 levels 299 --- on replacement - now  improved to 482 -continue replacement.  3) recurrent CVA - thought to be related to small vessel disease as per neurology. Has a small PFO which could be an additional risk factor as well as his afib  4)  P afib Plan - on Xarelto per cardiology.  5) Patient Active Problem List   Diagnosis Date Noted  . History of loop recorder 06/12/2017  . Cryptogenic stroke ([place]) 10/28/2016  . Gait abnormality   . History of CVA (cerebrovascular accident)   . History of TIA (transient ischemic attack)   . Benign essential HTN   . Paroxysmal SVT (supraventricular tachycardia) ([place])   . Acute blood loss anemia   . Acute ischemic stroke ([place])   . CVA (cerebral vascular accident) ([place]) 10/26/2016  . History of recent stroke 06/06/2016  . 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 TIA (transient ischemic attack) 06/02/2016  . Numbness 06/01/2016  . Right arm numbness 06/01/2016  . Atherosclerosis of native coronary artery of native heart without angina pectoris 03/20/2016  . Hypertension   . GERD (gastroesophageal reflux disease)   . Gout   . S/P RF ablation operation for arrhythmia 12/13/2014  . Hyperlipidemia 12/13/2014  . Controlled type 2 diabetes mellitus with complication, without long-term current use of insulin ([place]) 12/07/2014    PLAN -Continue f/u with PCP (blood sugars)  6) Lower extremity discomfort - improved. Likely Discogenic pain -[place] on 1/19 revealed no blood clot -DG Femur Min 2 Views Right completed on 01/10/18 with results revealing No acute osseous abnormality identified about the right femur. Right hip joint space remains normal. -Per his bone study, Degenerative discs and narrow nerve root opening at L4 and L5 could be causing this discomfort; could be a pinched nerve. -MRI on lower back and nerve conduction study to r/o or confirm nerve pain, which would guide our choice of a nerve related medication. -We will prescribe tramadol for his pain -Pt has established care  with orthopedist and PT.  RTC with Dr Irene Limbo in 3 weeks with labs   All of the patients questions were answered with apparent satisfaction. The patient knows to call the clinic with any problems, questions or concerns.  . The total time spent in the appointment was 25 minutes and more than 50% was on counseling and direct patient cares.       Sullivan Lone MD [place] AAHIVMS [facility] Hematology/Oncology Physician [place]  (Office):       [phone] (Work cell):  [phone] (Fax):           [phone]  This document serves as a record of services personally performed by Sullivan Lone, MD. It was created on his behalf by Baldwin [place], a trained medical scribe. The creation of this record is based on the scribe's personal observations and the provider's statements to them.   .I have reviewed the above documentation for accuracy and completeness, and I agree with the above. Brunetta Genera MD MS

## 2018-03-04 NOTE — Patient Instructions (Addendum)
Medication Instructions:   Your physician recommends that you continue on your current medications as directed. Please refer to the Current Medication list given to you today.   If you need a refill on your cardiac medications before your next appointment, please call your pharmacy.  Labwork: NONE ORDERED  TODAY    Testing/Procedures: NONE ORDERED  TODAY    Follow-Up:  Your physician wants you to follow-up in: [place] will receive a reminder letter in the mail two months in advance. If you don't receive a letter, please call our office to schedule the follow-up appointment.     Any Other Special Instructions Will Be Listed Below (If Applicable).                                                                                                                                                 '

## 2018-03-05 ENCOUNTER — Telehealth: Payer: Self-pay | Admitting: Hematology

## 2018-03-05 ENCOUNTER — Inpatient Hospital Stay (HOSPITAL_BASED_OUTPATIENT_CLINIC_OR_DEPARTMENT_OTHER): Payer: Medicare Other | Admitting: Hematology

## 2018-03-05 ENCOUNTER — Other Ambulatory Visit: Payer: Medicare Other

## 2018-03-05 ENCOUNTER — Encounter: Payer: Self-pay | Admitting: Hematology

## 2018-03-05 VITALS — BP 117/61 | HR 88 | Temp 99.5°F | Resp 18 | Ht 68.0 in | Wt 170.2 lb

## 2018-03-05 DIAGNOSIS — Z7901 Long term (current) use of anticoagulants: Secondary | ICD-10-CM | POA: Diagnosis not present

## 2018-03-05 DIAGNOSIS — M79606 Pain in leg, unspecified: Secondary | ICD-10-CM | POA: Diagnosis not present

## 2018-03-05 DIAGNOSIS — D696 Thrombocytopenia, unspecified: Secondary | ICD-10-CM

## 2018-03-05 DIAGNOSIS — I480 Paroxysmal atrial fibrillation: Secondary | ICD-10-CM | POA: Diagnosis not present

## 2018-03-05 DIAGNOSIS — E119 Type 2 diabetes mellitus without complications: Secondary | ICD-10-CM | POA: Diagnosis not present

## 2018-03-05 DIAGNOSIS — E538 Deficiency of other specified B group vitamins: Secondary | ICD-10-CM | POA: Diagnosis not present

## 2018-03-05 DIAGNOSIS — M549 Dorsalgia, unspecified: Secondary | ICD-10-CM | POA: Diagnosis not present

## 2018-03-05 DIAGNOSIS — D649 Anemia, unspecified: Secondary | ICD-10-CM | POA: Diagnosis not present

## 2018-03-05 DIAGNOSIS — C9000 Multiple myeloma not having achieved remission: Secondary | ICD-10-CM | POA: Diagnosis not present

## 2018-03-05 DIAGNOSIS — I10 Essential (primary) hypertension: Secondary | ICD-10-CM

## 2018-03-05 DIAGNOSIS — Z8673 Personal history of transient ischemic attack (TIA), and cerebral infarction without residual deficits: Secondary | ICD-10-CM

## 2018-03-05 LAB — MULTIPLE MYELOMA PANEL, SERUM
ALBUMIN/GLOB SERPL: 1.1 (ref 0.7–1.7)
ALPHA 1: 0.2 g/dL (ref 0.0–0.4)
Albumin SerPl Elph-Mcnc: 4.1 g/dL (ref 2.9–4.4)
Alpha2 Glob SerPl Elph-Mcnc: 0.7 g/dL (ref 0.4–1.0)
B-Globulin SerPl Elph-Mcnc: 0.8 g/dL (ref 0.7–1.3)
GLOBULIN, TOTAL: 3.8 g/dL (ref 2.2–3.9)
Gamma Glob SerPl Elph-Mcnc: 2.1 g/dL — ABNORMAL HIGH (ref 0.4–1.8)
IGA: 32 mg/dL — AB (ref 61–437)
IGM (IMMUNOGLOBULIN M), SRM: 25 mg/dL (ref 15–143)
IgG (Immunoglobin G), Serum: 2408 mg/dL — ABNORMAL HIGH (ref 700–1600)
M Protein SerPl Elph-Mcnc: 1.8 g/dL — ABNORMAL HIGH
Total Protein ELP: 7.9 g/dL (ref 6.0–8.5)

## 2018-03-05 NOTE — Telephone Encounter (Signed)
Scheduled appt per 3/14 los - Gave patient AVS and calender per los.  

## 2018-03-09 MED FILL — NINLARO 4 MG CAP: 4 | 28 days supply | Qty: 3 | Fill #2

## 2018-03-11 ENCOUNTER — Other Ambulatory Visit: Payer: Self-pay | Admitting: *Deleted

## 2018-03-11 DIAGNOSIS — C9000 Multiple myeloma not having achieved remission: Secondary | ICD-10-CM

## 2018-03-11 MED ORDER — LENALIDOMIDE 15 MG PO CAPS: 15.0000 mg | ORAL_CAPSULE | Freq: Every day | ORAL | 0 refills | Status: DC

## 2018-03-18 DIAGNOSIS — M5416 Radiculopathy, lumbar region: Secondary | ICD-10-CM | POA: Diagnosis not present

## 2018-03-20 ENCOUNTER — Telehealth: Payer: Self-pay

## 2018-03-20 NOTE — Telephone Encounter (Signed)
Pt called to reschedule lab work on Monday or Wednesday of next week because his revlimid is being shipped on Tuesday, and he wants to be able to receive it. Pt rescheduled for 4/1 at 1030 for lab work. Pt verbalized understanding. Lab appointment on 4/2 cancelled. Okay to use labs ordered for 4/2 on 4/1. Pt prefers C.L., which was added to the appointment notes.

## 2018-03-23 ENCOUNTER — Inpatient Hospital Stay: Payer: Medicare Other | Attending: Hematology

## 2018-03-23 DIAGNOSIS — Z8673 Personal history of transient ischemic attack (TIA), and cerebral infarction without residual deficits: Secondary | ICD-10-CM | POA: Diagnosis not present

## 2018-03-23 DIAGNOSIS — T451X5A Adverse effect of antineoplastic and immunosuppressive drugs, initial encounter: Secondary | ICD-10-CM | POA: Diagnosis not present

## 2018-03-23 DIAGNOSIS — Z7901 Long term (current) use of anticoagulants: Secondary | ICD-10-CM | POA: Diagnosis not present

## 2018-03-23 DIAGNOSIS — D6959 Other secondary thrombocytopenia: Secondary | ICD-10-CM | POA: Insufficient documentation

## 2018-03-23 DIAGNOSIS — C9000 Multiple myeloma not having achieved remission: Secondary | ICD-10-CM | POA: Diagnosis not present

## 2018-03-23 DIAGNOSIS — D6481 Anemia due to antineoplastic chemotherapy: Secondary | ICD-10-CM | POA: Insufficient documentation

## 2018-03-23 DIAGNOSIS — E119 Type 2 diabetes mellitus without complications: Secondary | ICD-10-CM | POA: Insufficient documentation

## 2018-03-23 DIAGNOSIS — M79606 Pain in leg, unspecified: Secondary | ICD-10-CM | POA: Insufficient documentation

## 2018-03-23 DIAGNOSIS — I480 Paroxysmal atrial fibrillation: Secondary | ICD-10-CM | POA: Diagnosis not present

## 2018-03-23 DIAGNOSIS — I10 Essential (primary) hypertension: Secondary | ICD-10-CM | POA: Insufficient documentation

## 2018-03-23 LAB — CMP (CANCER CENTER ONLY)
ALBUMIN: 3.4 g/dL — AB (ref 3.5–5.0)
ALK PHOS: 52 U/L (ref 40–150)
ALT: 7 U/L (ref 0–55)
ANION GAP: 8 (ref 3–11)
AST: 17 U/L (ref 5–34)
BILIRUBIN TOTAL: 0.3 mg/dL (ref 0.2–1.2)
BUN: 25 mg/dL (ref 7–26)
CALCIUM: 9.5 mg/dL (ref 8.4–10.4)
CO2: 26 mmol/L (ref 22–29)
CREATININE: 1.22 mg/dL (ref 0.70–1.30)
Chloride: 102 mmol/L (ref 98–109)
GFR, Estimated: 55 mL/min — ABNORMAL LOW (ref >60)
GLUCOSE: 195 mg/dL — AB (ref 70–140)
Potassium: 4 mmol/L (ref 3.5–5.1)
SODIUM: 136 mmol/L (ref 136–145)
TOTAL PROTEIN: 7.9 g/dL (ref 6.4–8.3)

## 2018-03-23 LAB — RETICULOCYTES
RBC.: 3.59 MIL/uL — AB (ref 4.20–5.82)
RETIC COUNT ABSOLUTE: 25.1 10*3/uL — AB (ref 34.8–93.9)
Retic Ct Pct: 0.7 % — ABNORMAL LOW (ref 0.8–1.8)

## 2018-03-23 LAB — CBC WITH DIFFERENTIAL (CANCER CENTER ONLY)
BASOS ABS: 0 10*3/uL (ref 0.0–0.1)
BASOS PCT: 0 %
Eosinophils Absolute: 0.1 10*3/uL (ref 0.0–0.5)
Eosinophils Relative: 2 %
HCT: 33.8 % — ABNORMAL LOW (ref 38.4–49.9)
HEMOGLOBIN: 10.7 g/dL — AB (ref 13.0–17.1)
LYMPHS PCT: 14 %
Lymphs Abs: 0.5 10*3/uL — ABNORMAL LOW (ref 0.9–3.3)
MCH: 29.8 pg (ref 27.2–33.4)
MCHC: 31.7 g/dL — ABNORMAL LOW (ref 32.0–36.0)
MCV: 94.2 fL (ref 79.3–98.0)
Monocytes Absolute: 0.3 10*3/uL (ref 0.1–0.9)
Monocytes Relative: 8 %
NEUTROS ABS: 2.8 10*3/uL (ref 1.5–6.5)
NEUTROS PCT: 76 %
Platelet Count: 125 10*3/uL — ABNORMAL LOW (ref 140–400)
RBC: 3.59 MIL/uL — ABNORMAL LOW (ref 4.20–5.82)
RDW: 15.5 % — ABNORMAL HIGH (ref 11.0–14.6)
WBC Count: 3.7 10*3/uL — ABNORMAL LOW (ref 4.0–10.3)

## 2018-03-24 ENCOUNTER — Other Ambulatory Visit: Payer: Medicare Other

## 2018-03-24 LAB — KAPPA/LAMBDA LIGHT CHAINS
KAPPA, LAMDA LIGHT CHAIN RATIO: 0.45 (ref 0.26–1.65)
Kappa free light chain: 20.5 mg/L — ABNORMAL HIGH (ref 3.3–19.4)
Lambda free light chains: 46 mg/L — ABNORMAL HIGH (ref 5.7–26.3)

## 2018-03-25 LAB — MULTIPLE MYELOMA PANEL, SERUM
ALBUMIN SERPL ELPH-MCNC: 3.4 g/dL (ref 2.9–4.4)
ALPHA 1: 0.3 g/dL (ref 0.0–0.4)
ALPHA2 GLOB SERPL ELPH-MCNC: 1 g/dL (ref 0.4–1.0)
Albumin/Glob SerPl: 0.9 (ref 0.7–1.7)
B-GLOBULIN SERPL ELPH-MCNC: 0.8 g/dL (ref 0.7–1.3)
GAMMA GLOB SERPL ELPH-MCNC: 1.9 g/dL — AB (ref 0.4–1.8)
GLOBULIN, TOTAL: 4 g/dL — AB (ref 2.2–3.9)
IGA: 54 mg/dL — AB (ref 61–437)
IGG (IMMUNOGLOBIN G), SERUM: 2085 mg/dL — AB (ref 700–1600)
IgM (Immunoglobulin M), Srm: 44 mg/dL (ref 15–143)
M PROTEIN SERPL ELPH-MCNC: 1.6 g/dL — AB
Total Protein ELP: 7.4 g/dL (ref 6.0–8.5)

## 2018-03-26 NOTE — Progress Notes (Signed)
[name] Kitchen    HEMATOLOGY/ONCOLOGY CLINIC NOTE  Date of Service: 03/27/18   Patient Care Team: Mayra Neer, MD as PCP - General (Family Medicine)  CHIEF COMPLAINTS/:   F/u for continued mx of Myeloma  HISTORY OF PRESENTING ILLNESS:   Justin [name] is a wonderful 77 y.o. 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  Patient has a history of hypertension, diabetes, GERD, gout, squamous cell skin cancers, elevated PSA, SVT status post ablation in May 2016, chronic back pain who has a history of recurrent CVA/TIA's. He apparently had a left hemispheric TIA June 2017 and has had recurrent) subcortical infarcts thought to be related to small vessel disease. He was noted to have a small PFO that was of questionable significance. He was initially on aspirin and then continued and aspirin + plavix for secondary stroke prevention. He follows with Dr. Antony Contras for his neurology cares.  An SPEP was done due to elevated total proteins of 8.9 and showed an M spike of 2 g/dL. He was also noted to have an elevated total protein of 8.6 in April 2017. He was referred to [place] for further evaluation of his M spike. Blood test did not reveal any hypercalcemia, no significant renal failure. He has been noted to have developed anemia since June the serum and his hemoglobin was 12.6 and is now down to the mid 10 range. He notes no focal bone pains at this time. Overt acute weight loss. No fevers no chills no night sweats.  CURRENT THERAPY:   Ixazomib+ Revlimid + Dexamethasone  INTERVAL HISTORY  Justin [name] presents to the office today for follow-up of his myeloma. Dr. Serita Grammes is his PCP at [facility]. The patient's last visit with [place] was on 03/05/18.  The pt reports that he is tolerating the Revlimid, Acyclovir, and Ninlaro very well and is pleased with his care so far. He notes that he is feeling quite well overall.   He notes that he has  head-position related dizziness.  Lab results (03/23/18) of CBC, CMP, and Reticulocytes is as follows: all values are WNL except for WBC at 3.7k, RBC at 3.59, Hgb at 10.7, HCT at 33.8, MCHC at 31.7, RDW at 15.5, Platelets at 125k, Lymphs Abs at 0.5k, Glucose at 195, Albumin at 3.4, Retic Ct Pct at 0.7%, Retic Ct Abs at 25.1. MMP 03/23/18 shows all values WNL except for IgG at 2085, IgA at 54, Gamma Glob at 1.9, M Protein at 1.6, Globulin Total at 4.0. Kappa/Lambda 03/23/18 showed continued decrease in his Lambda free chains and normalization of K/L ratio. Myeloma panel shows decrease n M protein to 1.6g/l On review of systems, pt reports resolved itching, and denies and new or concerning symptoms.    MEDICAL HISTORY:  Past Medical History:  Diagnosis Date  . Anemia   . Arthritis   . 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 Diabetes mellitus without complication ([place])   . GERD (gastroesophageal reflux disease)   . Gout   . History of loop recorder   . Hypertension   . Left leg weakness   . Paroxysmal SVT (supraventricular tachycardia) ([place])    a. s/p RFCA on 05/01/15  . Stroke [facility])    TIA and mild stroke    SURGICAL HISTORY: Past Surgical History:  Procedure Laterality Date  . ATRIAL FIBRILLATION ABLATION     Dr. Lovena Le  .  BASAL CELL CARCINOMA EXCISION     off of back  . ELECTROPHYSIOLOGIC STUDY N/A 05/01/2015   Procedure: SVT Ablation;  Surgeon: Evans Lance, MD;  Location: [place] CV LAB;  Service: Cardiovascular;  Laterality: N/A;  . EP IMPLANTABLE DEVICE N/A 06/04/2016   Procedure: Loop Recorder Insertion;  Surgeon: Thompson Grayer, MD;  Location: [place] CV LAB;  Service: Cardiovascular;  Laterality: N/A;  . INGUINAL HERNIA REPAIR Bilateral 06/16/2017   Procedure: LAPAROSCOPIC BILATERAL INGUINAL HERNIA REPAIR;  Surgeon: Clovis Riley, MD;  Location: [place];  Service: General;  Laterality: Bilateral;  . INSERTION OF MESH Bilateral 06/16/2017    Procedure: INSERTION OF MESH;  Surgeon: Clovis Riley, MD;  Location: [place];  Service: General;  Laterality: Bilateral;  . SQUAMOUS CELL CARCINOMA EXCISION    . TEE WITHOUT CARDIOVERSION N/A 06/04/2016   Procedure: TRANSESOPHAGEAL ECHOCARDIOGRAM (TEE);  Surgeon: Pixie Casino, MD;  Location: [facility] ENDOSCOPY;  Service: Cardiovascular;  Laterality: N/A;    SOCIAL HISTORY: Social History   Socioeconomic History  . Marital status: Married    Spouse name: Not on file  . Number of children: Not on file  . Years of education: Not on file  . Highest education level: Not on file  Occupational History  . Not on file  Social Needs  . Financial resource strain: Not on file  . Food insecurity:    Worry: Not on file    Inability: Not on file  . Transportation needs:    Medical: Not on file    Non-medical: Not on file  Tobacco Use  . Smoking status: Never Smoker  . Smokeless tobacco: Never Used  Substance and Sexual Activity  . Alcohol use: No  . Drug use: No  . Sexual activity: Not on file  Lifestyle  . Physical activity:    Days per week: Not on file    Minutes per session: Not on file  . Stress: Not on file  Relationships  . Social connections:    Talks on phone: Not on file    Gets together: Not on file    Attends religious service: Not on file    Active member of club or organization: Not on file    Attends meetings of clubs or organizations: Not on file    Relationship status: Not on file  . Intimate partner violence:    Fear of current or ex partner: Not on file    Emotionally abused: Not on file    Physically abused: Not on file    Forced sexual activity: Not on file  Other Topics Concern  . Not on file  Social History Narrative  . Not on file    FAMILY HISTORY: Family History  Problem Relation Age of Onset  . Stroke Mother   . Hypertension Mother   . Alzheimer's disease Father   . Heart failure Father   . Down syndrome Daughter     ALLERGIES:  is allergic  to no known allergies.  MEDICATIONS:  Current Outpatient Medications  Medication Sig Dispense Refill  . acyclovir (ZOVIRAX) 400 MG tablet Take 1 tablet (400 mg total) by mouth daily. 90 tablet 0  . allopurinol (ZYLOPRIM) 300 MG tablet Take 300 mg by mouth daily.     [name] Kitchen atorvastatin (LIPITOR) 40 MG tablet Take 1 tablet (40 mg total) by mouth every morning. 90 tablet 3  . cyanocobalamin 1000 MCG tablet Take 1,000 mcg by mouth daily.    [name] Kitchen dexamethasone (DECADRON) 4  MG tablet Take 5 tablets (38m) by mouth once weekly. Take on days 1, 8, 15, and 21 of every 28day cycle 60 tablet 0  . fluorouracil (EFUDEX) 5 % cream Apply 1 application topically daily as needed (for skin cancer prevention).   0  . glucose monitoring kit (FREESTYLE) monitoring kit 1 each by Does not apply route as needed for other.    . ixazomib citrate (NINLARO) 4 MG capsule Take 1 capsule by mouth once weekly on days 1, 8, and 15, of each 28 day cycle. Take on an empty stomach 1hr before or 2hrs after food. 3 capsule 2  . lenalidomide (REVLIMID) 15 MG capsule Take 1 capsule (15 mg total) by mouth daily. Take for 21 days on, 7 days off, repeat every 28 days. Authorization ##[id number] capsule 0  . metFORMIN (GLUCOPHAGE) 1000 MG tablet Take 1,000 mg by mouth daily with breakfast.    . nitroGLYCERIN (NITROSTAT) 0.4 MG SL tablet Place 1 tablet (0.4 mg total) under the tongue every 5 (five) minutes as needed for chest pain. 25 tablet 3  . omeprazole (PRILOSEC) 20 MG capsule Take 20 mg by mouth every morning.     . rivaroxaban (XARELTO) 20 MG TABS tablet Take 1 tablet (20 mg total) by mouth daily with supper. 90 tablet 2  . telmisartan-hydrochlorothiazide (MICARDIS HCT) 80-12.5 MG tablet Take 1 tablet by mouth daily. 90 tablet 3  . traMADol (ULTRAM) 50 MG tablet Take 1-2 tablets (50-100 mg total) by mouth every 6 (six) hours as needed for moderate pain or severe pain. 50 tablet 0   No current facility-administered medications for this  visit.     REVIEW OF SYSTEMS:    10 Point review of Systems was done is negative except as noted above.    PHYSICAL EXAMINATION:  ECOG PERFORMANCE STATUS: 2 - Symptomatic, <50% confined to bed  . Vitals:   03/27/18 0927  BP: 122/61  Pulse: 66  Resp: 17  Temp: 98.1 F (36.7 C)  SpO2: 100%   Filed Weights   03/27/18 0927  Weight: 161 lb 4.8 oz (73.2 kg)   Body mass index is 24.53 kg/m.  GENERAL:alert, in no acute distress and comfortable SKIN: no acute rashes, no significant lesions EYES: conjunctiva are pink and non-injected, sclera anicteric OROPHARYNX: MMM, no exudates, no oropharyngeal erythema or ulceration NECK: supple, no JVD LYMPH:  no palpable lymphadenopathy in the cervical, axillary or inguinal regions LUNGS: clear to auscultation b/l with normal respiratory effort HEART: regular rate & rhythm ABDOMEN:  normoactive bowel sounds , non tender, not distended. Extremity: no pedal edema PSYCH: alert & oriented x 3 with fluent speech NEURO: no focal motor/sensory deficits     LABORATORY DATA:  I have reviewed the data as listed. CBC Latest Ref Rng & Units 03/23/2018 03/02/2018 02/10/2018  WBC 4.0 - 10.3 K/uL 3.7(L) 5.8 4.0  Hemoglobin 13.0 - 17.1 g/dL - - -  Hematocrit 38.4 - 49.9 % 33.8(L) 33.4(L) 38.3(L)  Platelets 140 - 400 K/uL 125(L) 80(L) 137(L)  hgb 10.8  CMP Latest Ref Rng & Units 03/23/2018 03/02/2018 02/10/2018  Glucose 70 - 140 mg/dL 195(H) 142(H) 93  BUN 7 - 26 mg/dL _0 Creatinine 0.70 - 1.30 mg/dL 1.22 1.14 1.29  Sodium 136 - 145 mmol/L 136 138 138  Potassium 3.5 - 5.1 mmol/L 4.0 4.1 4.0  Chloride 98 - 109 mmol/L 102 104 102  CO2 22 - 29 mmol/L _1 Calcium 8.4 - 10.4  mg/dL 9.5 9.4 10.0  Total Protein 6.4 - 8.3 g/dL 7.9 8.4(H) 9.3(H)  Total Bilirubin 0.2 - 1.2 mg/dL 0.3 0.4 0.5  Alkaline Phos 40 - 150 U/L 52 50 51  AST 5 - 34 U/L _0 ALT 0 - 55 U/L _1 Lab Results  Component Value Date   IRON 45 06/02/2017   TIBC 233  06/02/2017   IRONPCTSAT 19 (L) 06/02/2017   (Iron and TIBC)  Lab Results  Component Value Date   FERRITIN 308 08/26/2017         RADIOGRAPHIC STUDIES: I have personally reviewed the radiological images as listed and agreed with the findings in the report.  Bone Marrow Biopsy 12/24/2016 (Accession [id number])  Diagnosis Bone Marrow, Aspirate,Biopsy, and Clot, left iliac crest - HYPERCELLULAR BONE MARROW FOR AGE WITH PLASMA CELL NEOPLASM. - SEE COMMENT. PERIPHERAL BLOOD: - NORMOCYTIC-NORMOCHROMIC ANEMIA. - LEUKOPENIA.  DG Bone Survey Met (Accession [id number]) (Order [id number])  Imaging  Date: 11/28/2016 Department: [facility]RAD[facility]OLOG[facility] Released By: Pricilla Riffle Authorizing: Brunetta Genera, MD  Exam Information   Status Exam Begun  Exam Ended   Final [99] 11/28/2016 11:59 AM 11/28/2016 12:36 PM  PACS Images   Show images for DG Bone Survey Met  Study Result   CLINICAL DATA:  Monoclonal gammopathy of unknown significance. History of squamous cell carcinoma excision, basal cell carcinoma excision.  EXAM: METASTATIC BONE SURVEY  COMPARISON:  CT neck, chest, abdomen and pelvis from 10/29/16, MRI of the head from 06/01/2016, CXR 10/27/2016, lumbar spine radiographs 06/24/2016  FINDINGS: Lateral skull: Small occipital lucency which may represent a normal arachnoid granulation posteriorly in the occiput. No definite lytic abnormality.  Cervical spine AP and lateral: Disc space narrowing at C2-3, and from C4 through C7. C4-5, C5-6 and C6-7 uncovertebral joint spurring bilaterally. No lytic abnormality.  Thoracic spine AP and lateral: T8-9 right-sided osteophytes and to a lesser degree T9-10. No lytic abnormality. Slight multilevel lumbar thoracic disc space narrowing likely degenerative.  Lumbar spine AP and lateral: Disc space narrowing at L4-5 and to a greater extent L5-S1. No lytic disease. L3 through S1  facet sclerosis.  AP pelvis: Negative for lytic disease.  Bilateral upper and lower extremities shoulders through wrist and from the hips through ankle: Negative for lytic disease. Joint space narrowing of the femorotibial compartments both knees. Subchondral cyst of the right patella.  CXR: Clear lungs. Cardiac implantable monitoring device projects over the left heart. Aortic atherosclerosis. No lytic disease.  IMPRESSION: No findings suspicious for lytic disease.   Electronically Signed   By: Ashley Royalty M.D.   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 (12/2016) Peak M spike 2.6  CYtogenetics and Myeloma FISH panel.- trisomy 11 Patient has normocytic anemia without any other clear etiology. (>2g/dl lower that lower limit of normal which is 13) which per criteria would place this in the Multiple myeloma category as opposed to Smoldering Multiple myeloma (Borderline criterion)  No overt renal failure or hypercalcemia at this time No focal bone pains though he has significant chronic back pain related to degenerative disc disease. Bone survey shows no overt lytic lesions.  2) anemia and thrombocytopenia -- related to treatment (Ixazomib + Revlimid) PLAN -patient has no new clinical concerns or symptoms. -continue Revlimid 52m 3 weeks  on 1 week off + Ninlaro D1,8,15 q28days. -The pt has been taking Xarelto (for Afib) which reduces the risk of blood clots from any of the treatments. -Order Zofran for prn use  -Will see pt back in 3 weeks for continue monitor and mx -Encouraged pt to continue being well hydrated, eating well, and staying active. -Discussed pt labwork today; M Protein decreased from 1.8 to 1.6 and IgG decreased from 2408 to 2085, both on 03/23/18 with normalization of K/Lratio - WBC decreased slightly to 3.7k, Platelets  increased to 125k. Blood counts and chemistries are otherwise stable. Light chain ratios have normalized.  -No prohibitive toxicities from myeloma treatment at this time.  -Discussed that we will continue Revlimid 23m as his M Protein is continuing to drop, and will consider 277min the future barring any prohibitive toxicities. -Discussed and ensured clarity of dose schedule with pt of Ninlaro and Revlimid.   2) Borderline low B12 levels 299 --- on replacement - now improved to 482 -continue replacement.  3) recurrent CVA - thought to be related to small vessel disease as per neurology. Has a small PFO which could be an additional risk factor as well as his afib  4)  P afib Plan - on Xarelto per cardiology.  5) Patient Active Problem List   Diagnosis Date Noted  . History of loop recorder 06/12/2017  . Cryptogenic stroke ([facility]/05[place]  . Gait abnormality   . History of CVA (cerebrovascular accident)   . History of TIA (transient ischemic attack)   . Benign essential HTN   . Paroxysmal SVT (supraventricular tachycardia) ([facility]  . Acute blood loss anemia   . Acute ischemic stroke ([facility]  . [place] (cerebral vascular accident) ([facility]  . History of recent stroke 06/06/2016  . 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 TIA (transient ischemic attack) 06/02/2016  . Numbness 06/01/2016  . Right arm numbness 06/01/2016  . Atherosclerosis of native coronary artery of native heart without angina pectoris 03/20/2016  . Hypertension   . GERD (gastroesophageal reflux disease)   . Gout   . S/P RF ablation operation for arrhythmia 12/13/2014  . Hyperlipidemia 12/13/2014  . Controlled type 2 diabetes mellitus with complication, without long-term current use of insulin ([facility]/16/2015    PLAN -Continue f/u with PCP (blood sugars)  6) Lower extremity discomfort - improved. Likely Discogenic pain -US[place] 1/19 revealed no blood clot -Pt has established care with orthopedist  and PT.  7. Possible Benign Positional Vertigo -Offered a referral to vestibular PT and the pt will consider this for CRMS.   RTC with Dr KaIrene Limbon 4 weeks with labs    All of the patients questions were answered with apparent satisfaction. The patient knows to call the clinic with any problems, questions or concerns.  . The total time spent in the appointment was 20 minutes and more than 50% was on counseling and direct patient cares.   GaSullivan LoneD MSGilpinAHIVMS SCClark Memorial HospitalTGlenn Medical Centerematology/Oncology Physician CoThornton(Office):       [phone] cell):  [phone]):           [phone] document serves as a record of services personally performed by GaSullivan LoneMD. It was created on his behalf by ScBaldwin Jamaicaa trained medical scribe. The creation of this record is based on the scribe's personal observations and the provider's statements to them.   .I have reviewed the above documentation  for accuracy and completeness, and I agree with the above. Brunetta Genera MD MS

## 2018-03-27 ENCOUNTER — Encounter: Payer: Self-pay | Admitting: Hematology

## 2018-03-27 ENCOUNTER — Inpatient Hospital Stay (HOSPITAL_BASED_OUTPATIENT_CLINIC_OR_DEPARTMENT_OTHER): Payer: Medicare Other | Admitting: Hematology

## 2018-03-27 ENCOUNTER — Telehealth: Payer: Self-pay | Admitting: Hematology

## 2018-03-27 VITALS — BP 122/61 | HR 66 | Temp 98.1°F | Resp 17 | Ht 68.0 in | Wt 161.3 lb

## 2018-03-27 DIAGNOSIS — T451X5A Adverse effect of antineoplastic and immunosuppressive drugs, initial encounter: Secondary | ICD-10-CM

## 2018-03-27 DIAGNOSIS — Z7901 Long term (current) use of anticoagulants: Secondary | ICD-10-CM | POA: Diagnosis not present

## 2018-03-27 DIAGNOSIS — D6959 Other secondary thrombocytopenia: Secondary | ICD-10-CM | POA: Diagnosis not present

## 2018-03-27 DIAGNOSIS — I480 Paroxysmal atrial fibrillation: Secondary | ICD-10-CM

## 2018-03-27 DIAGNOSIS — Z8673 Personal history of transient ischemic attack (TIA), and cerebral infarction without residual deficits: Secondary | ICD-10-CM

## 2018-03-27 DIAGNOSIS — I10 Essential (primary) hypertension: Secondary | ICD-10-CM | POA: Diagnosis not present

## 2018-03-27 DIAGNOSIS — C9000 Multiple myeloma not having achieved remission: Secondary | ICD-10-CM | POA: Diagnosis not present

## 2018-03-27 DIAGNOSIS — M79606 Pain in leg, unspecified: Secondary | ICD-10-CM

## 2018-03-27 DIAGNOSIS — D649 Anemia, unspecified: Secondary | ICD-10-CM

## 2018-03-27 DIAGNOSIS — E119 Type 2 diabetes mellitus without complications: Secondary | ICD-10-CM | POA: Diagnosis not present

## 2018-03-27 DIAGNOSIS — E538 Deficiency of other specified B group vitamins: Secondary | ICD-10-CM

## 2018-03-27 DIAGNOSIS — D696 Thrombocytopenia, unspecified: Secondary | ICD-10-CM

## 2018-03-27 DIAGNOSIS — D6481 Anemia due to antineoplastic chemotherapy: Secondary | ICD-10-CM

## 2018-03-27 NOTE — Telephone Encounter (Signed)
Appointments scheduled AVS/Calendar printed per 4/5 los °

## 2018-03-30 ENCOUNTER — Other Ambulatory Visit: Payer: Self-pay | Admitting: Hematology

## 2018-03-30 DIAGNOSIS — C9000 Multiple myeloma not having achieved remission: Secondary | ICD-10-CM

## 2018-04-02 ENCOUNTER — Ambulatory Visit (INDEPENDENT_AMBULATORY_CARE_PROVIDER_SITE_OTHER): Payer: Medicare Other | Admitting: *Deleted

## 2018-04-02 DIAGNOSIS — I639 Cerebral infarction, unspecified: Secondary | ICD-10-CM

## 2018-04-02 NOTE — Progress Notes (Signed)
Carelink Summary Report / Loop Recorder 

## 2018-04-06 MED FILL — NINLARO 4 MG CAP: 4 | 28 days supply | Qty: 3 | Fill #0

## 2018-04-13 LAB — CUP PACEART REMOTE DEVICE CHECK
Implantable Pulse Generator Implant Date: 20170613
MDC IDC SESS DTM: 20190309170651

## 2018-04-14 ENCOUNTER — Other Ambulatory Visit: Payer: Self-pay | Admitting: Hematology and Oncology

## 2018-04-14 DIAGNOSIS — C9000 Multiple myeloma not having achieved remission: Secondary | ICD-10-CM

## 2018-04-14 MED ORDER — LENALIDOMIDE 15 MG PO CAPS: 15.0000 mg | ORAL_CAPSULE | Freq: Every day | ORAL | 0 refills | Status: DC

## 2018-04-21 ENCOUNTER — Telehealth: Payer: Self-pay | Admitting: *Deleted

## 2018-04-21 ENCOUNTER — Other Ambulatory Visit: Payer: Self-pay

## 2018-04-21 DIAGNOSIS — C9000 Multiple myeloma not having achieved remission: Secondary | ICD-10-CM

## 2018-04-21 MED ORDER — LENALIDOMIDE 15 MG PO CAPS: 15.0000 mg | ORAL_CAPSULE | Freq: Every day | ORAL | 0 refills | Status: DC

## 2018-04-21 NOTE — Telephone Encounter (Signed)
Call received from [facility] in reference to [place].  "I have two pills left so I missed a dose.  April 8th was week off.  Started [facility] April 15th.  Should have taken [facility] April 22nd, was going to take it Thursday of last week but must have missed it is why I'm calling,.  What do I do now?"    Call transferred to collaborative for further communication and help with this request.

## 2018-04-22 ENCOUNTER — Encounter: Payer: Self-pay | Admitting: Hematology

## 2018-04-23 ENCOUNTER — Other Ambulatory Visit: Payer: Self-pay

## 2018-04-24 ENCOUNTER — Telehealth: Payer: Self-pay

## 2018-04-24 ENCOUNTER — Inpatient Hospital Stay: Payer: Medicare Other | Attending: Hematology

## 2018-04-24 DIAGNOSIS — Z7901 Long term (current) use of anticoagulants: Secondary | ICD-10-CM | POA: Insufficient documentation

## 2018-04-24 DIAGNOSIS — M79609 Pain in unspecified limb: Secondary | ICD-10-CM | POA: Insufficient documentation

## 2018-04-24 DIAGNOSIS — D6481 Anemia due to antineoplastic chemotherapy: Secondary | ICD-10-CM | POA: Diagnosis not present

## 2018-04-24 DIAGNOSIS — T451X5A Adverse effect of antineoplastic and immunosuppressive drugs, initial encounter: Secondary | ICD-10-CM | POA: Insufficient documentation

## 2018-04-24 DIAGNOSIS — D6959 Other secondary thrombocytopenia: Secondary | ICD-10-CM | POA: Insufficient documentation

## 2018-04-24 DIAGNOSIS — M545 Low back pain: Secondary | ICD-10-CM | POA: Insufficient documentation

## 2018-04-24 DIAGNOSIS — D649 Anemia, unspecified: Secondary | ICD-10-CM

## 2018-04-24 DIAGNOSIS — E119 Type 2 diabetes mellitus without complications: Secondary | ICD-10-CM | POA: Diagnosis not present

## 2018-04-24 DIAGNOSIS — Z8673 Personal history of transient ischemic attack (TIA), and cerebral infarction without residual deficits: Secondary | ICD-10-CM | POA: Insufficient documentation

## 2018-04-24 DIAGNOSIS — I480 Paroxysmal atrial fibrillation: Secondary | ICD-10-CM | POA: Insufficient documentation

## 2018-04-24 DIAGNOSIS — G8929 Other chronic pain: Secondary | ICD-10-CM | POA: Insufficient documentation

## 2018-04-24 DIAGNOSIS — C9000 Multiple myeloma not having achieved remission: Secondary | ICD-10-CM | POA: Diagnosis not present

## 2018-04-24 DIAGNOSIS — E538 Deficiency of other specified B group vitamins: Secondary | ICD-10-CM

## 2018-04-24 DIAGNOSIS — D696 Thrombocytopenia, unspecified: Secondary | ICD-10-CM

## 2018-04-24 DIAGNOSIS — I10 Essential (primary) hypertension: Secondary | ICD-10-CM | POA: Insufficient documentation

## 2018-04-24 LAB — CMP (CANCER CENTER ONLY)
ALK PHOS: 65 U/L (ref 40–150)
ALT: 8 U/L (ref 0–55)
AST: 19 U/L (ref 5–34)
Albumin: 3.4 g/dL — ABNORMAL LOW (ref 3.5–5.0)
Anion gap: 6 (ref 3–11)
BILIRUBIN TOTAL: 0.4 mg/dL (ref 0.2–1.2)
BUN: 23 mg/dL (ref 7–26)
CALCIUM: 9.5 mg/dL (ref 8.4–10.4)
CO2: 29 mmol/L (ref 22–29)
CREATININE: 1.08 mg/dL (ref 0.70–1.30)
Chloride: 104 mmol/L (ref 98–109)
Glucose, Bld: 124 mg/dL (ref 70–140)
Potassium: 3.9 mmol/L (ref 3.5–5.1)
Sodium: 139 mmol/L (ref 136–145)
TOTAL PROTEIN: 7.4 g/dL (ref 6.4–8.3)

## 2018-04-24 LAB — CBC WITH DIFFERENTIAL (CANCER CENTER ONLY)
BASOS ABS: 0.1 10*3/uL (ref 0.0–0.1)
Basophils Relative: 2 %
EOS ABS: 0.2 10*3/uL (ref 0.0–0.5)
EOS PCT: 5 %
HCT: 33.6 % — ABNORMAL LOW (ref 38.4–49.9)
Hemoglobin: 10.8 g/dL — ABNORMAL LOW (ref 13.0–17.1)
Lymphocytes Relative: 15 %
Lymphs Abs: 0.6 10*3/uL — ABNORMAL LOW (ref 0.9–3.3)
MCH: 30.4 pg (ref 27.2–33.4)
MCHC: 32.1 g/dL (ref 32.0–36.0)
MCV: 94.6 fL (ref 79.3–98.0)
MONO ABS: 0.3 10*3/uL (ref 0.1–0.9)
Monocytes Relative: 7 %
Neutro Abs: 2.8 10*3/uL (ref 1.5–6.5)
Neutrophils Relative %: 71 %
PLATELETS: 129 10*3/uL — AB (ref 140–400)
RBC: 3.55 MIL/uL — ABNORMAL LOW (ref 4.20–5.82)
RDW: 16.7 % — AB (ref 11.0–14.6)
WBC: 3.9 10*3/uL — AB (ref 4.0–10.3)

## 2018-04-24 LAB — RETICULOCYTES
RBC.: 3.55 MIL/uL — ABNORMAL LOW (ref 4.20–5.82)
RETIC COUNT ABSOLUTE: 21.3 10*3/uL — AB (ref 34.8–93.9)
RETIC CT PCT: 0.6 % — AB (ref 0.8–1.8)

## 2018-04-24 LAB — VITAMIN B12: VITAMIN B 12: 724 pg/mL (ref 180–914)

## 2018-04-24 NOTE — Telephone Encounter (Signed)
Spoke with patient on 04/23/18 regarding ninlaro and revlimid plan. Pt accidentally missed a dose of ninlaro, and wanted to know how to proceed. Verbalized that he had called pharmacy, and despite the order being placed on Tuesday, the pharmacy would not process the order at that time. Spoke with [facility] representative who explained that the prescription was ready and that they were just waiting on the patient to call back and schedule shipping.   Spoke with the pt again to explain need to call [facility] for shipping. Pt would run out of revlimid on 04/23/18. Discussed missed dose with Dr. Irene Limbo. Plan moving forward: the pt should come in for lab work 04/24/18 as scheduled, hold revlimid and ninlaro, restart revlimid and ninlaro on 04/30/18. Pt verbalized that he did not understand. This RN explained the plan three times to promote adherence and the pt provided readback of the information given.

## 2018-04-27 LAB — KAPPA/LAMBDA LIGHT CHAINS
Kappa free light chain: 28.9 mg/L — ABNORMAL HIGH (ref 3.3–19.4)
Kappa, lambda light chain ratio: 0.42 (ref 0.26–1.65)
Lambda free light chains: 68.4 mg/L — ABNORMAL HIGH (ref 5.7–26.3)

## 2018-04-29 LAB — MULTIPLE MYELOMA PANEL, SERUM
Albumin SerPl Elph-Mcnc: 3.4 g/dL (ref 2.9–4.4)
Albumin/Glob SerPl: 1.1 (ref 0.7–1.7)
Alpha 1: 0.4 g/dL (ref 0.0–0.4)
Alpha2 Glob SerPl Elph-Mcnc: 0.9 g/dL (ref 0.4–1.0)
B-GLOBULIN SERPL ELPH-MCNC: 0.9 g/dL (ref 0.7–1.3)
GAMMA GLOB SERPL ELPH-MCNC: 1.1 g/dL (ref 0.4–1.8)
GLOBULIN, TOTAL: 3.3 g/dL (ref 2.2–3.9)
IGA: 50 mg/dL — AB (ref 61–437)
IgG (Immunoglobin G), Serum: 1370 mg/dL (ref 700–1600)
IgM (Immunoglobulin M), Srm: 31 mg/dL (ref 15–143)
M Protein SerPl Elph-Mcnc: 0.9 g/dL — ABNORMAL HIGH
Total Protein ELP: 6.7 g/dL (ref 6.0–8.5)

## 2018-04-29 NOTE — Progress Notes (Signed)
[name] Kitchen    HEMATOLOGY/ONCOLOGY CLINIC NOTE  Date of Service: 04/30/18   Patient Care Team: Mayra Neer, MD as PCP - General (Family Medicine)  CHIEF COMPLAINTS/:   F/u for continued mx of Myeloma  HISTORY OF PRESENTING ILLNESS:   Justin [name] is a wonderful 77 y.o. 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  Patient has a history of hypertension, diabetes, GERD, gout, squamous cell skin cancers, elevated PSA, SVT status post ablation in May 2016, chronic back pain who has a history of recurrent CVA/TIA's. He apparently had a left hemispheric TIA June 2017 and has had recurrent) subcortical infarcts thought to be related to small vessel disease. He was noted to have a small PFO that was of questionable significance. He was initially on aspirin and then continued and aspirin + plavix for secondary stroke prevention. He follows with Dr. Antony Contras for his neurology cares.  An SPEP was done due to elevated total proteins of 8.9 and showed an M spike of 2 g/dL. He was also noted to have an elevated total protein of 8.6 in April 2017. He was referred to [place] for further evaluation of his M spike. Blood test did not reveal any hypercalcemia, no significant renal failure. He has been noted to have developed anemia since June the serum and his hemoglobin was 12.6 and is now down to the mid 10 range. He notes no focal bone pains at this time. Overt acute weight loss. No fevers no chills no night sweats.  CURRENT THERAPY:   Ixazomib+ Revlimid + Dexamethasone  INTERVAL HISTORY  Justin [name] presents to the office today for follow-up of his myeloma. Dr. Serita Grammes is his PCP at [facility]. The patient's last visit with [place] was on 03/27/18. The pt reports that he is doing well overall.   The pt reports that he confused his dosing schedule and has not taken any medication for the last week. He will begin D1 of Ninlaro and Revlimid  today. He notes that three weeks ago he felt a pain in his groin which "felt like a hernia," but the pain has since resolved. He also notes that he has some hard stools, and then diarrhea but this has also resolved. He notes significant resolution of his back pain.   He notes no other concerns taking his Revlimid or Ninlaro.   Lab results (04/24/18) of CBC, CMP, and Reticulocytes is as follows: all values are WNL except for WBC at 3.9k, RBC at 3.55, Hgb at 10.8, HCT at 33.6, RDW at 16.7, Platelets at 129k, Lymphs Abs at 0.6k, Albumin at 3.4, Retic Ct Pct at 0.6%, Retic ct abs at 21.3. MMP 04/24/18 showed all values WNL except for IgA at 50 and M Protein at 0.9.  Kappa/Lambda 04/30/18 shows Kappa elevated at 28.9, Lambda elevated at 68.4.  Vitamin B12 04/30/18 was WNl at 724.   On review of systems, pt reports resolving diarrhea, resolved back pain, some gained weight, increased appetite, and denies abdominal pains, mouth sores, and any other symptoms.    MEDICAL HISTORY:  Past Medical History:  Diagnosis Date  . Anemia   . Arthritis   . 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 Diabetes mellitus without complication ([place])   . GERD (gastroesophageal reflux disease)   . Gout   . History of loop recorder   . Hypertension   . Left  leg weakness   . Paroxysmal SVT (supraventricular tachycardia) ([place])    a. s/p RFCA on 05/01/15  . Stroke [facility])    TIA and mild stroke    SURGICAL HISTORY: Past Surgical History:  Procedure Laterality Date  . ATRIAL FIBRILLATION ABLATION     Dr. Lovena Le  . BASAL CELL CARCINOMA EXCISION     off of back  . ELECTROPHYSIOLOGIC STUDY N/A 05/01/2015   Procedure: SVT Ablation;  Surgeon: Evans Lance, MD;  Location: [place] [facility];  Service: Cardiovascular;  Laterality: N/A;  . EP IMPLANTABLE DEVICE N/A 06/04/2016   Procedure: Loop Recorder Insertion;  Surgeon: Thompson Grayer, MD;  Location: [place] [facility];  Service: Cardiovascular;   Laterality: N/A;  . INGUINAL HERNIA REPAIR Bilateral 06/16/2017   Procedure: LAPAROSCOPIC BILATERAL INGUINAL HERNIA REPAIR;  Surgeon: Clovis Riley, MD;  Location: [place];  Service: General;  Laterality: Bilateral;  . INSERTION OF MESH Bilateral 06/16/2017   Procedure: INSERTION OF MESH;  Surgeon: Clovis Riley, MD;  Location: [place];  Service: General;  Laterality: Bilateral;  . SQUAMOUS CELL CARCINOMA EXCISION    . TEE WITHOUT CARDIOVERSION N/A 06/04/2016   Procedure: TRANSESOPHAGEAL ECHOCARDIOGRAM (TEE);  Surgeon: Pixie Casino, MD;  Location: [facility];  Service: Cardiovascular;  Laterality: N/A;    SOCIAL HISTORY: Social History   Socioeconomic History  . Marital status: Married    Spouse name: Not on file  . Number of children: Not on file  . Years of education: Not on file  . Highest education level: Not on file  Occupational History  . Not on file  Social Needs  . Financial resource strain: Not on file  . Food insecurity:    Worry: Not on file    Inability: Not on file  . Transportation needs:    Medical: Not on file    Non-medical: Not on file  Tobacco Use  . Smoking status: Never Smoker  . Smokeless tobacco: Never Used  Substance and Sexual Activity  . Alcohol use: No  . Drug use: No  . Sexual activity: Not on file  Lifestyle  . Physical activity:    Days per week: Not on file    Minutes per session: Not on file  . Stress: Not on file  Relationships  . Social connections:    Talks on phone: Not on file    Gets together: Not on file    Attends religious service: Not on file    Active member of club or organization: Not on file    Attends meetings of clubs or organizations: Not on file    Relationship status: Not on file  . Intimate partner violence:    Fear of current or ex partner: Not on file    Emotionally abused: Not on file    Physically abused: Not on file    Forced sexual activity: Not on file  Other Topics Concern  . Not on file  Social  History Narrative  . Not on file    FAMILY HISTORY: Family History  Problem Relation Age of Onset  . Stroke Mother   . Hypertension Mother   . Alzheimer's disease Father   . Heart failure Father   . Down syndrome Daughter     ALLERGIES:  is allergic to no known allergies.  MEDICATIONS:  Current Outpatient Medications  Medication Sig Dispense Refill  . acyclovir (ZOVIRAX) 400 MG tablet Take 1 tablet (400 mg total) by mouth daily. 90 tablet 0  .  allopurinol (ZYLOPRIM) 300 MG tablet Take 300 mg by mouth daily.     [name] Kitchen atorvastatin (LIPITOR) 40 MG tablet Take 1 tablet (40 mg total) by mouth every morning. 90 tablet 3  . cyanocobalamin 1000 MCG tablet Take 1,000 mcg by mouth daily.    [name] Kitchen dexamethasone (DECADRON) 4 MG tablet Take 5 tablets (32m) by mouth once weekly. Take on days 1, 8, 15, and 21 of every 28day cycle 60 tablet 0  . fluorouracil (EFUDEX) 5 % cream Apply 1 application topically daily as needed (for skin cancer prevention).   0  . glucose monitoring kit (FREESTYLE) monitoring kit 1 each by Does not apply route as needed for other.    . lenalidomide (REVLIMID) 15 MG capsule Take 1 capsule (15 mg total) by mouth daily. Take for 21 days on, 7 days off, repeat every 28 days. Authorization ##[id number] 21 capsule 0  . metFORMIN (GLUCOPHAGE) 1000 MG tablet Take 1,000 mg by mouth daily with breakfast.    . NINLARO 4 MG capsule TAKE 1 CAPSULE BY MOUTH ONCE WEEKLY ON DAYS 1, 8, AND 15, OF EACH 28 DAY CYCLE. TAKE ON AN EMPTY STOMACH 1HR BEFORE OR 2HRS AFTER FOOD. 3 capsule 2  . nitroGLYCERIN (NITROSTAT) 0.4 MG SL tablet Place 1 tablet (0.4 mg total) under the tongue every 5 (five) minutes as needed for chest pain. 25 tablet 3  . omeprazole (PRILOSEC) 20 MG capsule Take 20 mg by mouth every morning.     . rivaroxaban (XARELTO) 20 MG TABS tablet Take 1 tablet (20 mg total) by mouth daily with supper. 90 tablet 2  . telmisartan-hydrochlorothiazide (MICARDIS HCT) 80-12.5 MG tablet Take 1  tablet by mouth daily. 90 tablet 3  . traMADol (ULTRAM) 50 MG tablet Take 1-2 tablets (50-100 mg total) by mouth every 6 (six) hours as needed for moderate pain or severe pain. 50 tablet 0   No current facility-administered medications for this visit.     REVIEW OF SYSTEMS:    A 10+ POINT REVIEW OF SYSTEMS WAS OBTAINED including neurology, dermatology, psychiatry, cardiac, respiratory, lymph, extremities, GI, GU, Musculoskeletal, constitutional, breasts, reproductive, HEENT.  All pertinent positives are noted in the HPI.  All others are negative.   PHYSICAL EXAMINATION:  ECOG PERFORMANCE STATUS: 2 - Symptomatic, <50% confined to bed  . Vitals:   04/30/18 1042  BP: 111/60  Pulse: 63  Resp: 18  Temp: 98 F (36.7 C)  SpO2: 97%   Filed Weights   04/30/18 1042  Weight: 168 lb 4.8 oz (76.3 kg)   Body mass index is 25.59 kg/m.  GENERAL:alert, in no acute distress and comfortable SKIN: no acute rashes, no significant lesions EYES: conjunctiva are pink and non-injected, sclera anicteric OROPHARYNX: MMM, no exudates, no oropharyngeal erythema or ulceration NECK: supple, no JVD LYMPH:  no palpable lymphadenopathy in the cervical, axillary or inguinal regions LUNGS: clear to auscultation b/l with normal respiratory effort HEART: regular rate & rhythm ABDOMEN:  normoactive bowel sounds , non tender, not distended. Extremity: no pedal edema PSYCH: alert & oriented x 3 with fluent speech NEURO: no focal motor/sensory deficits    LABORATORY DATA:  I have reviewed the data as listed. CBC Latest Ref Rng & Units 04/24/2018 03/23/2018 03/02/2018  WBC 4.0 - 10.3 K/uL 3.9(L) 3.7(L) 5.8  Hemoglobin 13.0 - 17.1 g/dL 10.8(L) 10.7(L) 10.8(L)  Hematocrit 38.4 - 49.9 % 33.6(L) 33.8(L) 33.4(L)  Platelets 140 - 400 K/uL 129(L) 125(L) 80(L)  hgb 10.8  CMP Latest Ref  Rng & Units 04/24/2018 03/23/2018 03/02/2018  Glucose 70 - 140 mg/dL 124 195(H) 142(H)  BUN 7 - 26 mg/dL _0 Creatinine 0.70 - 1.30  mg/dL 1.08 1.22 1.14  Sodium 136 - 145 mmol/L 139 136 138  Potassium 3.5 - 5.1 mmol/L 3.9 4.0 4.1  Chloride 98 - 109 mmol/L 104 102 104  CO2 22 - 29 mmol/L _1 Calcium 8.4 - 10.4 mg/dL 9.5 9.5 9.4  Total Protein 6.4 - 8.3 g/dL 7.4 7.9 8.4(H)  Total Bilirubin 0.2 - 1.2 mg/dL 0.4 0.3 0.4  Alkaline Phos 40 - 150 U/L 65 52 50  AST 5 - 34 U/L _2 ALT 0 - 55 U/L _3 Lab Results  Component Value Date   IRON 45 06/02/2017   TIBC 233 06/02/2017   IRONPCTSAT 19 (L) 06/02/2017   (Iron and TIBC)  Lab Results  Component Value Date   FERRITIN 308 08/26/2017         RADIOGRAPHIC STUDIES: I have personally reviewed the radiological images as listed and agreed with the findings in the report.  Bone Marrow Biopsy 12/24/2016 (Accession [id number])  Diagnosis Bone Marrow, Aspirate,Biopsy, and Clot, left iliac crest - HYPERCELLULAR BONE MARROW FOR AGE WITH PLASMA CELL NEOPLASM. - SEE COMMENT. PERIPHERAL BLOOD: - NORMOCYTIC-NORMOCHROMIC ANEMIA. - LEUKOPENIA.  DG Bone Survey Met (Accession [id number]) (Order [id number])  Imaging  Date: 11/28/2016 Department: [facility] Released By: Pricilla Riffle Authorizing: Brunetta Genera, MD  Exam Information   Status Exam Begun  Exam Ended   Final [99] 11/28/2016 11:59 AM 11/28/2016 12:36 PM  PACS Images   Show images for DG Bone Survey Met  Study Result   CLINICAL DATA:  Monoclonal gammopathy of unknown significance. History of squamous cell carcinoma excision, basal cell carcinoma excision.  EXAM: METASTATIC BONE SURVEY  COMPARISON:  CT neck, chest, abdomen and pelvis from 10/29/16, MRI of the head from 06/01/2016, CXR 10/27/2016, lumbar spine radiographs 06/24/2016  FINDINGS: Lateral skull: Small occipital lucency which may represent a normal arachnoid granulation posteriorly in the occiput. No definite lytic abnormality.  Cervical spine AP and lateral: Disc space  narrowing at C2-3, and from C4 through C7. C4-5, C5-6 and C6-7 uncovertebral joint spurring bilaterally. No lytic abnormality.  Thoracic spine AP and lateral: T8-9 right-sided osteophytes and to a lesser degree T9-10. No lytic abnormality. Slight multilevel lumbar thoracic disc space narrowing likely degenerative.  Lumbar spine AP and lateral: Disc space narrowing at L4-5 and to a greater extent L5-S1. No lytic disease. L3 through S1 facet sclerosis.  AP pelvis: Negative for lytic disease.  Bilateral upper and lower extremities shoulders through wrist and from the hips through ankle: Negative for lytic disease. Joint space narrowing of the femorotibial compartments both knees. Subchondral cyst of the right patella.  CXR: Clear lungs. Cardiac implantable monitoring device projects over the left heart. Aortic atherosclerosis. No lytic disease.  IMPRESSION: No findings suspicious for lytic disease.   Electronically Signed   By: Ashley Royalty M.D.   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 (12/2016) Peak M spike 2.6  CYtogenetics and Myeloma FISH panel.- trisomy 11 Patient has normocytic anemia without any other clear etiology. (>2g/dl lower that lower limit of normal which is 13) which per criteria would  place this in the Multiple myeloma category as opposed to Smoldering Multiple myeloma (Borderline criterion)  No overt renal failure or hypercalcemia at this time No focal bone pains though he has significant chronic back pain related to degenerative disc disease. Bone survey shows no overt lytic lesions.  2) anemia and thrombocytopenia -- related to treatment (Ixazomib + Revlimid) PLAN -patient has no new clinical concerns or symptoms. -continue Revlimid 28m 3 weeks on 1 week off + Ninlaro D1,8,15 q28days. -The pt has been  taking Xarelto (for Afib) which reduces the risk of blood clots from any of the treatments. -Discussed pt labwork from, 04/24/18; M Protein decreased from 1.6 to 0.9, blood counts and chemistries are stable.  -Reviewed dosing schedule in detail with pt again and pt expressed clarity. -Pt is tolerating Revlimid and Ninlaro well and has no prohibitive toxicities from continuing. -Avoid heavy lifting and over-exertion at this time in view of recent concern of inguinal hernia.  -Will see pt back in 3 weeks for continue monitor and mx -Encouraged pt to continue being well hydrated, eating well, and staying active. -Discussed that we will continue Revlimid 166mas his M Protein is continuing to drop, and will consider 2557mn the future barring any prohibitive toxicities.    2) Borderline low B12 levels 299 --- on replacement - now improved to 482 and now to 724 -continue replacement.  3) recurrent CVA - thought to be related to small vessel disease as per neurology. Has a small PFO which could be an additional risk factor as well as his afib  4)  P afib Plan - on Xarelto per cardiology.  5) Patient Active Problem List   Diagnosis Date Noted  . History of loop recorder 06/12/2017  . Cryptogenic stroke ([place]1/05/2016  . Gait abnormality   . History of CVA (cerebrovascular accident)   . History of TIA (transient ischemic attack)   . Benign essential HTN   . Paroxysmal SVT (supraventricular tachycardia) ([place] . Acute blood loss anemia   . Acute ischemic stroke ([place] . CVA (cerebral vascular accident) ([place]1/03/2016  . History of recent stroke 06/06/2016  . 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 TIA (transient ischemic attack) 06/02/2016  . Numbness 06/01/2016  . Right arm numbness 06/01/2016  . Atherosclerosis of native coronary artery of native heart without angina pectoris 03/20/2016  . Hypertension   . GERD (gastroesophageal reflux disease)   . Gout   . S/P RF ablation  operation for arrhythmia 12/13/2014  . Hyperlipidemia 12/13/2014  . Controlled type 2 diabetes mellitus with complication, without long-term current use of insulin ([place]CC[place]2/16/2015    PLAN -Continue f/u with PCP (blood sugars)  6) Lower extremity discomfort - improved. Likely Discogenic pain -US [place] 1/19 revealed no blood clot -Pt has established care with orthopedist and PT.   RTC with Dr KalIrene Limbon 4 weeks with labs    All of the patients questions were answered with apparent satisfaction. The patient knows to call the clinic with any problems, questions or concerns.  . The total time spent in the appointment was 25 minutes and more than 50% was on counseling and direct patient cares.    GauSullivan Lone MS DawsonHIVMS SCHDallas Endoscopy Center LtdHLenox Hill Hospitalmatology/Oncology Physician [facility] LPOffice):       [phone] cell):  [phone]):           [phone] document serves as a record of  services personally performed by Sullivan Lone, MD. It was created on his behalf by Baldwin Jamaica, a trained medical scribe. The creation of this record is based on the scribe's personal observations and the provider's statements to them.   .I have reviewed the above documentation for accuracy and completeness, and I agree with the above. Brunetta Genera MD

## 2018-04-30 ENCOUNTER — Inpatient Hospital Stay (HOSPITAL_BASED_OUTPATIENT_CLINIC_OR_DEPARTMENT_OTHER): Payer: Medicare Other | Admitting: Hematology

## 2018-04-30 ENCOUNTER — Encounter: Payer: Self-pay | Admitting: Hematology

## 2018-04-30 ENCOUNTER — Telehealth: Payer: Self-pay | Admitting: Hematology

## 2018-04-30 VITALS — BP 111/60 | HR 63 | Temp 98.0°F | Resp 18 | Ht 68.0 in | Wt 168.3 lb

## 2018-04-30 DIAGNOSIS — M79609 Pain in unspecified limb: Secondary | ICD-10-CM

## 2018-04-30 DIAGNOSIS — M545 Low back pain: Secondary | ICD-10-CM

## 2018-04-30 DIAGNOSIS — T451X5A Adverse effect of antineoplastic and immunosuppressive drugs, initial encounter: Secondary | ICD-10-CM

## 2018-04-30 DIAGNOSIS — Z7901 Long term (current) use of anticoagulants: Secondary | ICD-10-CM | POA: Diagnosis not present

## 2018-04-30 DIAGNOSIS — Z8673 Personal history of transient ischemic attack (TIA), and cerebral infarction without residual deficits: Secondary | ICD-10-CM | POA: Diagnosis not present

## 2018-04-30 DIAGNOSIS — C9000 Multiple myeloma not having achieved remission: Secondary | ICD-10-CM

## 2018-04-30 DIAGNOSIS — G8929 Other chronic pain: Secondary | ICD-10-CM

## 2018-04-30 DIAGNOSIS — E538 Deficiency of other specified B group vitamins: Secondary | ICD-10-CM | POA: Diagnosis not present

## 2018-04-30 DIAGNOSIS — D6959 Other secondary thrombocytopenia: Secondary | ICD-10-CM | POA: Diagnosis not present

## 2018-04-30 DIAGNOSIS — D6481 Anemia due to antineoplastic chemotherapy: Secondary | ICD-10-CM | POA: Diagnosis not present

## 2018-04-30 DIAGNOSIS — I10 Essential (primary) hypertension: Secondary | ICD-10-CM | POA: Diagnosis not present

## 2018-04-30 DIAGNOSIS — I480 Paroxysmal atrial fibrillation: Secondary | ICD-10-CM

## 2018-04-30 DIAGNOSIS — E119 Type 2 diabetes mellitus without complications: Secondary | ICD-10-CM | POA: Diagnosis not present

## 2018-04-30 MED ORDER — ACYCLOVIR 400 MG PO TABS: 400.0000 mg | ORAL_TABLET | Freq: Every day | ORAL | 0 refills | Status: DC

## 2018-04-30 MED ORDER — DEXAMETHASONE 4 MG PO TABS: ORAL_TABLET | ORAL | 0 refills | Status: DC

## 2018-04-30 NOTE — Patient Instructions (Addendum)
Thank you for choosing [place] to provide your oncology and hematology care.  To afford each patient quality time with our providers, please arrive 30 minutes before your scheduled appointment time.  If you arrive late for your appointment, you may be asked to reschedule.  We strive to give you quality time with our providers, and arriving late affects you and other patients whose appointments are after yours.   If you are a no show for multiple scheduled visits, you may be dismissed from the clinic at the providers discretion.    Again, thank you for choosing [facility], our hope is that these requests will decrease the amount of time that you wait before being seen by our physicians.  ______________________________________________________________________  Should you have questions after your visit to the [facility], please contact our office at [phone] between the hours of 8:30 and 4:30 p.m.    Voicemails left after 4:30p.m will not be returned until the following business day.    For prescription refill requests, please have your pharmacy contact us directly.  Please also try to allow 48 hours for prescription requests.    Please contact the scheduling department for questions regarding scheduling.  For scheduling of procedures such as PET scans, CT scans, MRI, Ultrasound, etc please contact central scheduling at [phone].    Resources For Cancer Patients and Caregivers:   Oncolink.org:  A wonderful resource for patients and healthcare providers for information regarding your disease, ways to tract your treatment, what to expect, etc.     [place]:  ([phone]  Can help patients locate various types of support and financial assistance  Cancer Care: 1-800-813-HOPE (318)327-7032) Provides financial assistance, online support groups, medication/co-pay assistance.    [place]:  865-517-1633 Where to apply for food  stamps, Medicaid, and utility assistance  Medicare Rights Center: 785-587-5377 Helps people with Medicare understand their rights and benefits, navigate the Medicare system, and secure the quality healthcare they deserve  SCAT: [place] [employer]'s shared-ride transportation service for eligible riders who have a disability that prevents them from riding the fixed route bus.    For additional information on assistance programs please contact our social worker:   [place]:  Keddie you for choosing [place] to provide your oncology and hematology care.  To afford each patient quality time with our providers, please arrive 30 minutes before your scheduled appointment time.  If you arrive late for your appointment, you may be asked to reschedule.  We strive to give you quality time with our providers, and arriving late affects you and other patients whose appointments are after yours.   If you are a no show for multiple scheduled visits, you may be dismissed from the clinic at the providers discretion.    Again, thank you for choosing [facility], our hope is that these requests will decrease the amount of time that you wait before being seen by our physicians.  ______________________________________________________________________  Should you have questions after your visit to the [facility], please contact our office at [phone] between the hours of 8:30 and 4:30 p.m.    Voicemails left after 4:30p.m will not be returned until the following business day.    For prescription refill requests, please have your pharmacy contact us directly.  Please also try to allow 48 hours for prescription requests.  Please contact the scheduling department for questions regarding scheduling.  For scheduling of procedures such as PET scans, CT scans, MRI, Ultrasound, etc please contact central  scheduling at [phone].    Resources For Cancer Patients and Caregivers:   Oncolink.org:  A wonderful resource for patients and healthcare providers for information regarding your disease, ways to tract your treatment, what to expect, etc.     [place]:  ([phone] 496-1048  Can help patients locate various types of support and financial assistance  Cancer Care: 1-800-813-HOPE 613-332-1149) Provides financial assistance, online support groups, medication/co-pay assistance.    [place]:  4376778990 Where to apply for food stamps, Medicaid, and utility assistance  Medicare Rights Center: (817)464-5581 Helps people with Medicare understand their rights and benefits, navigate the Medicare system, and secure the quality healthcare they deserve  SCAT: [place] [employer]'s shared-ride transportation service for eligible riders who have a disability that prevents them from riding the fixed route bus.    For additional information on assistance programs please contact our social worker:   [place]:  [phone]

## 2018-04-30 NOTE — Telephone Encounter (Signed)
Appointments scheduled AVS/Calendar printed per 5/9 los °

## 2018-05-01 DIAGNOSIS — E1159 Type 2 diabetes mellitus with other circulatory complications: Secondary | ICD-10-CM | POA: Diagnosis not present

## 2018-05-01 DIAGNOSIS — I4891 Unspecified atrial fibrillation: Secondary | ICD-10-CM | POA: Diagnosis not present

## 2018-05-01 DIAGNOSIS — L989 Disorder of the skin and subcutaneous tissue, unspecified: Secondary | ICD-10-CM | POA: Diagnosis not present

## 2018-05-01 DIAGNOSIS — I2510 Atherosclerotic heart disease of native coronary artery without angina pectoris: Secondary | ICD-10-CM | POA: Diagnosis not present

## 2018-05-01 DIAGNOSIS — I10 Essential (primary) hypertension: Secondary | ICD-10-CM | POA: Diagnosis not present

## 2018-05-01 DIAGNOSIS — Z7984 Long term (current) use of oral hypoglycemic drugs: Secondary | ICD-10-CM | POA: Diagnosis not present

## 2018-05-01 DIAGNOSIS — M109 Gout, unspecified: Secondary | ICD-10-CM | POA: Diagnosis not present

## 2018-05-01 DIAGNOSIS — C9000 Multiple myeloma not having achieved remission: Secondary | ICD-10-CM | POA: Diagnosis not present

## 2018-05-04 MED FILL — NINLARO 4 MG CAP: 4 | 28 days supply | Qty: 3 | Fill #1

## 2018-05-05 ENCOUNTER — Ambulatory Visit (INDEPENDENT_AMBULATORY_CARE_PROVIDER_SITE_OTHER): Payer: Medicare Other | Admitting: *Deleted

## 2018-05-05 DIAGNOSIS — I639 Cerebral infarction, unspecified: Secondary | ICD-10-CM

## 2018-05-06 LAB — CUP PACEART REMOTE DEVICE CHECK
Implantable Pulse Generator Implant Date: 20170613
MDC IDC SESS DTM: 20190411174117

## 2018-05-06 NOTE — Progress Notes (Signed)
Carelink Summary Report / Loop Recorder 

## 2018-05-14 DIAGNOSIS — T148XXA Other injury of unspecified body region, initial encounter: Secondary | ICD-10-CM | POA: Diagnosis not present

## 2018-05-14 DIAGNOSIS — W548XXA Other contact with dog, initial encounter: Secondary | ICD-10-CM | POA: Diagnosis not present

## 2018-05-19 ENCOUNTER — Other Ambulatory Visit: Payer: Self-pay

## 2018-05-19 DIAGNOSIS — C9000 Multiple myeloma not having achieved remission: Secondary | ICD-10-CM

## 2018-05-19 MED ORDER — LENALIDOMIDE 15 MG PO CAPS: 15.0000 mg | ORAL_CAPSULE | Freq: Every day | ORAL | 0 refills | Status: DC

## 2018-05-25 ENCOUNTER — Telehealth: Payer: Self-pay

## 2018-05-25 ENCOUNTER — Inpatient Hospital Stay: Payer: Medicare Other | Attending: Hematology

## 2018-05-25 DIAGNOSIS — G629 Polyneuropathy, unspecified: Secondary | ICD-10-CM | POA: Insufficient documentation

## 2018-05-25 DIAGNOSIS — D6959 Other secondary thrombocytopenia: Secondary | ICD-10-CM | POA: Insufficient documentation

## 2018-05-25 DIAGNOSIS — M549 Dorsalgia, unspecified: Secondary | ICD-10-CM | POA: Insufficient documentation

## 2018-05-25 DIAGNOSIS — Z8673 Personal history of transient ischemic attack (TIA), and cerebral infarction without residual deficits: Secondary | ICD-10-CM | POA: Diagnosis not present

## 2018-05-25 DIAGNOSIS — G8929 Other chronic pain: Secondary | ICD-10-CM | POA: Diagnosis not present

## 2018-05-25 DIAGNOSIS — Z85828 Personal history of other malignant neoplasm of skin: Secondary | ICD-10-CM | POA: Insufficient documentation

## 2018-05-25 DIAGNOSIS — R21 Rash and other nonspecific skin eruption: Secondary | ICD-10-CM | POA: Diagnosis not present

## 2018-05-25 DIAGNOSIS — T451X5A Adverse effect of antineoplastic and immunosuppressive drugs, initial encounter: Secondary | ICD-10-CM | POA: Insufficient documentation

## 2018-05-25 DIAGNOSIS — R197 Diarrhea, unspecified: Secondary | ICD-10-CM | POA: Diagnosis not present

## 2018-05-25 DIAGNOSIS — C9000 Multiple myeloma not having achieved remission: Secondary | ICD-10-CM | POA: Diagnosis not present

## 2018-05-25 DIAGNOSIS — I4891 Unspecified atrial fibrillation: Secondary | ICD-10-CM | POA: Insufficient documentation

## 2018-05-25 DIAGNOSIS — D6481 Anemia due to antineoplastic chemotherapy: Secondary | ICD-10-CM | POA: Diagnosis not present

## 2018-05-25 DIAGNOSIS — Z7901 Long term (current) use of anticoagulants: Secondary | ICD-10-CM | POA: Insufficient documentation

## 2018-05-25 LAB — CBC WITH DIFFERENTIAL (CANCER CENTER ONLY)
BASOS ABS: 0 10*3/uL (ref 0.0–0.1)
BASOS PCT: 1 %
EOS PCT: 2 %
Eosinophils Absolute: 0.1 10*3/uL (ref 0.0–0.5)
HCT: 37.2 % — ABNORMAL LOW (ref 38.4–49.9)
Hemoglobin: 12 g/dL — ABNORMAL LOW (ref 13.0–17.1)
LYMPHS PCT: 27 %
Lymphs Abs: 1.1 10*3/uL (ref 0.9–3.3)
MCH: 30.6 pg (ref 27.2–33.4)
MCHC: 32.3 g/dL (ref 32.0–36.0)
MCV: 94.9 fL (ref 79.3–98.0)
MONO ABS: 0.4 10*3/uL (ref 0.1–0.9)
Monocytes Relative: 9 %
Neutro Abs: 2.4 10*3/uL (ref 1.5–6.5)
Neutrophils Relative %: 61 %
PLATELETS: 169 10*3/uL (ref 140–400)
RBC: 3.92 MIL/uL — AB (ref 4.20–5.82)
RDW: 17.9 % — ABNORMAL HIGH (ref 11.0–14.6)
WBC: 3.9 10*3/uL — AB (ref 4.0–10.3)

## 2018-05-25 LAB — RETICULOCYTES
RBC.: 3.92 MIL/uL — AB (ref 4.20–5.82)
RETIC COUNT ABSOLUTE: 19.6 10*3/uL — AB (ref 34.8–93.9)
RETIC CT PCT: 0.5 % — AB (ref 0.8–1.8)

## 2018-05-25 LAB — CMP (CANCER CENTER ONLY)
ALK PHOS: 59 U/L (ref 40–150)
ALT: 15 U/L (ref 0–55)
ANION GAP: 10 (ref 3–11)
AST: 28 U/L (ref 5–34)
Albumin: 4.3 g/dL (ref 3.5–5.0)
BILIRUBIN TOTAL: 0.4 mg/dL (ref 0.2–1.2)
BUN: 32 mg/dL — AB (ref 7–26)
CALCIUM: 9.7 mg/dL (ref 8.4–10.4)
CO2: 23 mmol/L (ref 22–29)
Chloride: 105 mmol/L (ref 98–109)
Creatinine: 1.49 mg/dL — ABNORMAL HIGH (ref 0.70–1.30)
GFR, EST NON AFRICAN AMERICAN: 43 mL/min — AB (ref >60)
GFR, Est AFR Am: 50 mL/min — ABNORMAL LOW (ref >60)
Glucose, Bld: 95 mg/dL (ref 70–140)
POTASSIUM: 4.7 mmol/L (ref 3.5–5.1)
Sodium: 138 mmol/L (ref 136–145)
TOTAL PROTEIN: 7 g/dL (ref 6.4–8.3)

## 2018-05-25 NOTE — Telephone Encounter (Signed)
Per patient request to r/s appointment for a earlier date. Okayed by RN to move appointment 1 week earlier. Per 6/3 walk- ins

## 2018-05-26 NOTE — Progress Notes (Signed)
[name] Kitchen    HEMATOLOGY/ONCOLOGY CLINIC NOTE  Date of Service: 05/27/2018    Patient Care Team: Justin Neer, [name] as PCP - General (Family Medicine)  CHIEF COMPLAINTS/:   F/u for continued mx of Myeloma  HISTORY OF PRESENTING ILLNESS:   Justin [name] is a wonderful 77 y.o. 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  Patient has a history of hypertension, diabetes, GERD, gout, squamous cell skin cancers, elevated PSA, SVT status post ablation in May 2016, chronic back pain who has a history of recurrent CVA/TIA's. He apparently had a left hemispheric TIA June 2017 and has had recurrent) subcortical infarcts thought to be related to small vessel disease. He was noted to have a small PFO that was of questionable significance. He was initially on aspirin and then continued and aspirin + plavix for secondary stroke prevention. He follows with Dr. Antony [name] for his neurology cares.  An SPEP was done due to elevated total proteins of 8.9 and showed an M spike of 2 g/dL. He was also noted to have an elevated total protein of 8.6 in April 2017. He was referred to [place] for further evaluation of his M spike. Blood test did not reveal any hypercalcemia, no significant renal failure. He has been noted to have developed anemia since June the serum and his hemoglobin was 12.6 and is now down to the mid 10 range. He notes no focal bone pains at this time. Overt acute weight loss. No fevers no chills no night sweats.  CURRENT THERAPY:    Ixazomib+ Revlimid + Dexamethasone  INTERVAL HISTORY   Justin [name] presents to the office today for follow-up of his myeloma. He presents to the clinic today noting he had to move his appointment up and plans to start his next cycle tomorrow. He notes for 1 cycle he accidentally took his medication for all 4 weeks.  He notes his Revlimid gives him occasional rashes that come and go on his arms. There are  usually itchy. He also notes occasional very loose stools. He notes occasional minimal neuropathy in his toes and fingers.   On review of symptoms, pt denies fever, chills blood in stool. Notes mild diarrhea, occasional rash and neuropathy.       MEDICAL HISTORY:  Past Medical History:  Diagnosis Date  . Anemia   . Arthritis   . 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 Diabetes mellitus without complication ([place])   . GERD (gastroesophageal reflux disease)   . Gout   . History of loop recorder   . Hypertension   . Left leg weakness   . Paroxysmal SVT (supraventricular tachycardia) ([place])    a. s/p RFCA on 05/01/15  . Stroke [facility])    TIA and mild stroke    SURGICAL HISTORY: Past Surgical History:  Procedure Laterality Date  . ATRIAL FIBRILLATION ABLATION     Justin [name]  . BASAL CELL CARCINOMA EXCISION     off of back  . ELECTROPHYSIOLOGIC STUDY N/A 05/01/2015   Procedure: SVT Ablation;  Surgeon: Justin [name];  Location: [place] [facility];  Service: Cardiovascular;  Laterality: N/A;  . EP IMPLANTABLE DEVICE N/A 06/04/2016   Procedure: Loop Recorder Insertion;  Surgeon: Justin [name];  Location: [place] [facility];  Service: Cardiovascular;  Laterality: N/A;  . INGUINAL HERNIA REPAIR Bilateral 06/16/2017   Procedure: LAPAROSCOPIC BILATERAL INGUINAL  HERNIA REPAIR;  Surgeon: Justin [name];  Location: [place];  Service: General;  Laterality: Bilateral;  . INSERTION OF MESH Bilateral 06/16/2017   Procedure: INSERTION OF MESH;  Surgeon: Justin [name];  Location: [place];  Service: General;  Laterality: Bilateral;  . SQUAMOUS CELL CARCINOMA EXCISION    . TEE WITHOUT CARDIOVERSION N/A 06/04/2016   Procedure: TRANSESOPHAGEAL ECHOCARDIOGRAM (TEE);  Surgeon: Justin Casino, [name];  Location: [facility];  Service: Cardiovascular;  Laterality: N/A;    SOCIAL HISTORY: Social History   Socioeconomic History  . Marital status: Married     Spouse name: Not on file  . Number of children: Not on file  . Years of education: Not on file  . Highest education level: Not on file  Occupational History  . Not on file  Social Needs  . Financial resource strain: Not on file  . Food insecurity:    Worry: Not on file    Inability: Not on file  . Transportation needs:    Medical: Not on file    Non-medical: Not on file  Tobacco Use  . Smoking status: Never Smoker  . Smokeless tobacco: Never Used  Substance and Sexual Activity  . Alcohol use: No  . Drug use: No  . Sexual activity: Not on file  Lifestyle  . Physical activity:    Days per week: Not on file    Minutes per session: Not on file  . Stress: Not on file  Relationships  . Social connections:    Talks on phone: Not on file    Gets together: Not on file    Attends religious service: Not on file    Active member of club or organization: Not on file    Attends meetings of clubs or organizations: Not on file    Relationship status: Not on file  . Intimate partner violence:    Fear of current or ex partner: Not on file    Emotionally abused: Not on file    Physically abused: Not on file    Forced sexual activity: Not on file  Other Topics Concern  . Not on file  Social History Narrative  . Not on file    FAMILY HISTORY: Family History  Problem Relation Age of Onset  . Stroke Mother   . Hypertension Mother   . Alzheimer's disease Father   . Heart failure Father   . Down syndrome Daughter     ALLERGIES:  is allergic to no known allergies.  MEDICATIONS:  Current Outpatient Medications  Medication Sig Dispense Refill  . acyclovir (ZOVIRAX) 400 MG tablet Take 1 tablet (400 mg total) by mouth daily. 90 tablet 0  . allopurinol (ZYLOPRIM) 300 MG tablet Take 300 mg by mouth daily.     [name] Kitchen atorvastatin (LIPITOR) 40 MG tablet Take 1 tablet (40 mg total) by mouth every morning. 90 tablet 3  . cyanocobalamin 1000 MCG tablet Take 1,000 mcg by mouth daily.    [name] Kitchen  dexamethasone (DECADRON) 4 MG tablet Take 5 tablets (83m) by mouth once weekly. Take on days 1, 8, 15, and 21 of every 28day cycle 60 tablet 0  . fluorouracil (EFUDEX) 5 % cream Apply 1 application topically daily as needed (for skin cancer prevention).   0  . glucose monitoring kit (FREESTYLE) monitoring kit 1 each by Does not apply route as needed for other.    . lenalidomide (REVLIMID) 15 MG capsule Take 1 capsule (15 mg total) by mouth daily.  Take for 21 days on, 7 days off, repeat every 28 days. Authorization #[id number] 05/19/18 21 capsule 0  . metFORMIN (GLUCOPHAGE) 1000 MG tablet Take 1,000 mg by mouth daily with breakfast.    . NINLARO 4 MG capsule TAKE 1 CAPSULE BY MOUTH ONCE WEEKLY ON DAYS 1, 8, AND 15, OF EACH 28 DAY CYCLE. TAKE ON AN EMPTY STOMACH 1HR BEFORE OR 2HRS AFTER FOOD. 3 capsule 2  . nitroGLYCERIN (NITROSTAT) 0.4 MG SL tablet Place 1 tablet (0.4 mg total) under the tongue every 5 (five) minutes as needed for chest pain. 25 tablet 3  . omeprazole (PRILOSEC) 20 MG capsule Take 20 mg by mouth every morning.     . rivaroxaban (XARELTO) 20 MG TABS tablet Take 1 tablet (20 mg total) by mouth daily with supper. 90 tablet 2  . telmisartan-hydrochlorothiazide (MICARDIS HCT) 80-12.5 MG tablet Take 1 tablet by mouth daily. 90 tablet 3  . traMADol (ULTRAM) 50 MG tablet Take 1-2 tablets (50-100 mg total) by mouth every 6 (six) hours as needed for moderate pain or severe pain. 50 tablet 0   No current facility-administered medications for this visit.     REVIEW OF SYSTEMS:    .10 Point review of Systems was done is negative except as noted above.  PHYSICAL EXAMINATION:   ECOG PERFORMANCE STATUS: 2 - Symptomatic, <50% confined to bed  Vitals:   05/27/18 0838  BP: 112/62  Pulse: (!) 57  Resp: 17  Temp: 98.1 F (36.7 C)  SpO2: 98%   Filed Weights   05/27/18 0838  Weight: 167 lb 3.2 oz (75.8 kg)   Body mass index is 25.42 kg/m. [name] Kitchen GENERAL:alert, in no acute distress and  comfortable SKIN: no acute rashes, no significant lesions EYES: conjunctiva are pink and non-injected, sclera anicteric OROPHARYNX: MMM, no exudates, no oropharyngeal erythema or ulceration NECK: supple, no JVD LYMPH:  no palpable lymphadenopathy in the cervical, axillary or inguinal regions LUNGS: clear to auscultation b/l with normal respiratory effort HEART: regular rate & rhythm ABDOMEN:  normoactive bowel sounds , non tender, not distended. Extremity: no pedal edema PSYCH: alert & oriented x 3 with fluent speech NEURO: no focal motor/sensory deficits    LABORATORY DATA:  I have reviewed the data as listed. CBC Latest Ref Rng & Units 05/25/2018 04/24/2018 03/23/2018  WBC 4.0 - 10.3 K/uL 3.9(L) 3.9(L) 3.7(L)  Hemoglobin 13.0 - 17.1 g/dL 12.0(L) 10.8(L) 10.7(L)  Hematocrit 38.4 - 49.9 % 37.2(L) 33.6(L) 33.8(L)  Platelets 140 - 400 K/uL 169 129(L) 125(L)  ANC 2.4k  CMP Latest Ref Rng & Units 05/25/2018 04/24/2018 03/23/2018  Glucose 70 - 140 mg/dL 95 124 195(H)  BUN 7 - 26 mg/dL 32(H) 23 25  Creatinine 0.70 - 1.30 mg/dL 1.49(H) 1.08 1.22  Sodium 136 - 145 mmol/L 138 139 136  Potassium 3.5 - 5.1 mmol/L 4.7 3.9 4.0  Chloride 98 - 109 mmol/L 105 104 102  CO2 22 - 29 mmol/L _0 Calcium 8.4 - 10.4 mg/dL 9.7 9.5 9.5  Total Protein 6.4 - 8.3 g/dL 7.0 7.4 7.9  Total Bilirubin 0.2 - 1.2 mg/dL 0.4 0.4 0.3  Alkaline Phos 40 - 150 U/L 59 65 52  AST 5 - 34 U/L _1 ALT 0 - 55 U/L _2 Lab Results  Component Value Date   IRON 45 06/02/2017   TIBC 233 06/02/2017   IRONPCTSAT 19 (L) 06/02/2017   (Iron and TIBC)  Lab Results  Component Value Date   FERRITIN 308 08/26/2017   Component     Latest Ref Rng & Units 01/12/2018 02/10/2018 03/02/2018 03/23/2018  IgG (Immunoglobin G), Serum     700 - 1,600 mg/dL 2,691 (H) 2,957 (H) 2,408 (H) 2,085 (H)  IgA     61 - 437 mg/dL 35 (L) 37 (L) 32 (L) 54 (L)  IgM (Immunoglobulin M), Srm     15 - 143 mg/dL _0 44  Total Protein ELP      6.0 - 8.5 g/dL 8.7 (H) 9.0 (H) 7.9 7.4  Albumin SerPl Elph-Mcnc     2.9 - 4.4 g/dL 4.3 4.5 (H) 4.1 3.4  Alpha 1     0.0 - 0.4 g/dL 0.3 0.2 0.2 0.3  Alpha2 Glob SerPl Elph-Mcnc     0.4 - 1.0 g/dL 0.8 0.8 0.7 1.0  B-Globulin SerPl Elph-Mcnc     0.7 - 1.3 g/dL 0.9 0.9 0.8 0.8  Gamma Glob SerPl Elph-Mcnc     0.4 - 1.8 g/dL 2.4 (H) 2.6 (H) 2.1 (H) 1.9 (H)  M Protein SerPl Elph-Mcnc     Not Observed g/dL 2.2 (H) 2.1 (H) 1.8 (H) 1.6 (H)  Globulin, Total     2.2 - 3.9 g/dL 4.4 (H) 4.5 (H) 3.8 4.0 (H)  Albumin/Glob SerPl     0.7 - 1.7 1.0 1.1 1.1 0.9  IFE 1      Comment Comment Comment Comment  Please Note (HCV):      Comment Comment Comment Comment   Component     Latest Ref Rng & Units 04/24/2018 05/25/2018  IgG (Immunoglobin G), Serum     700 - 1,600 mg/dL 1,370 1,117  IgA     61 - 437 mg/dL 50 (L) 36 (L)  IgM (Immunoglobulin M), Srm     15 - 143 mg/dL 31 25  Total Protein ELP     6.0 - 8.5 g/dL 6.7 6.5  Albumin SerPl Elph-Mcnc     2.9 - 4.4 g/dL 3.4 3.6  Alpha 1     0.0 - 0.4 g/dL 0.4 0.3  Alpha2 Glob SerPl Elph-Mcnc     0.4 - 1.0 g/dL 0.9 0.7  B-Globulin SerPl Elph-Mcnc     0.7 - 1.3 g/dL 0.9 0.9  Gamma Glob SerPl Elph-Mcnc     0.4 - 1.8 g/dL 1.1 1.0  M Protein SerPl Elph-Mcnc     Not Observed g/dL 0.9 (H) 0.7 (H)  Globulin, Total     2.2 - 3.9 g/dL 3.3 2.9  Albumin/Glob SerPl     0.7 - 1.7 1.1 1.3  IFE 1      Comment Comment  Please Note (HCV):      Comment Comment        RADIOGRAPHIC STUDIES: I have personally reviewed the radiological images as listed and agreed with the findings in the report.  Bone Marrow Biopsy 12/24/2016 (Accession [id number])  Diagnosis Bone Marrow, Aspirate,Biopsy, and Clot, left iliac crest - HYPERCELLULAR BONE MARROW FOR AGE WITH PLASMA CELL NEOPLASM. - SEE COMMENT. PERIPHERAL BLOOD: - NORMOCYTIC-NORMOCHROMIC ANEMIA. - LEUKOPENIA.  DG Bone Survey Met (Accession [id number]) (Order [id number])  Imaging  Date: 11/28/2016 Department:  [facility] Released By: Pricilla Riffle Authorizing: Brunetta Genera, [name]  Exam Information   Status Exam Begun  Exam Ended   Final [99] 11/28/2016 11:59 AM 11/28/2016 12:36 PM  PACS Images   Show images for DG Bone Survey Met  Study Result   CLINICAL DATA:  Monoclonal gammopathy of unknown significance. History of squamous cell carcinoma excision, basal cell carcinoma excision.  EXAM: METASTATIC BONE SURVEY  COMPARISON:  CT neck, chest, abdomen and pelvis from 10/29/16, MRI of the head from 06/01/2016, CXR 10/27/2016, lumbar spine radiographs 06/24/2016  FINDINGS: Lateral skull: Small occipital lucency which may represent a normal arachnoid granulation posteriorly in the occiput. No definite lytic abnormality.  Cervical spine AP and lateral: Disc space narrowing at C2-3, and from C4 through C7. C4-5, C5-6 and C6-7 uncovertebral joint spurring bilaterally. No lytic abnormality.  Thoracic spine AP and lateral: T8-9 right-sided osteophytes and to a lesser degree T9-10. No lytic abnormality. Slight multilevel lumbar thoracic disc space narrowing likely degenerative.  Lumbar spine AP and lateral: Disc space narrowing at L4-5 and to a greater extent L5-S1. No lytic disease. L3 through S1 facet sclerosis.  AP pelvis: Negative for lytic disease.  Bilateral upper and lower extremities shoulders through wrist and from the hips through ankle: Negative for lytic disease. Joint space narrowing of the femorotibial compartments both knees. Subchondral cyst of the right patella.  CXR: Clear lungs. Cardiac implantable monitoring device projects over the left heart. Aortic atherosclerosis. No lytic disease.  IMPRESSION: No findings suspicious for lytic disease.   Electronically Signed   By: Ashley Royalty M.D.   On: 11/28/2016 14:58     ASSESSMENT & PLAN:   77 year old male with  1) IgG Lambda Multiple Myeloma - RISS  1 BM Bx with 20% clonal plasma cell with Lambda light chain restriction. (12/2016) Peak M spike 2.6  CYtogenetics and Myeloma FISH panel.- trisomy 11 Patient has normocytic anemia without any other clear etiology. (>2g/dl lower that lower limit of normal which is 13) which per criteria would place this in the Multiple myeloma category as opposed to Smoldering Multiple myeloma (Borderline criterion)  No overt renal failure or hypercalcemia at this time No focal bone pains though he has significant chronic back pain related to degenerative disc disease. Bone survey shows no overt lytic lesions.  2) anemia and thrombocytopenia -- related to treatment (Ixazomib + Revlimid)  3) Occasional Rash of extremities and neuropathy, Mild diarrhea- secondary to treatment   PLAN  -Labs reviewed with pt. His Hg increased to 12, WBC at 3.9 and Cr at 1.49 and BUN at 32.  MM panel shows M spike continues to decrease and is down to 0.7g/dl -I encouraged him to drink more water to stay hydrated, 64 ounces a day of non-alcohol, non-caffeine and non-carbonated liquids.   -patient has no new clinical concerns or symptoms. He still gets recurrent rashes on his extremities. - I recommend he start Claritin prophylactically. For his mild diarrhea I recommend he can use OTC imodium as needed.  -Pt is tolerating Revlimid and Ninlaro well and has no prohibitive toxicities from continuing. -continue Revlimid 75m 3 weeks on 1 week off + Ninlaro D1,8,15 q28days. Start next cycle tomorrow 6/6 -Reviewed dosing schedule in detail with pt again and pt expressed clarity. He did have 1 cycle where he accidentally took both medications for all 4 weeks.  -Continue Xarelto (for Afib) which reduces the risk of blood clots from any of the treatments. -Will keep him up to date on his vaccines. He [place].  -Avoid heavy lifting and over-exertion at this time in view of recent concern of inguinal hernia.   -Will see pt back in 3 weeks for continue monitor and mx    4) Borderline low B12 levels 299 ---  on replacement - 04/2018 B12 level now improved to 724 -continue replacement.  5) recurrent CVA - thought to be related to small vessel disease as per neurology. Has a small PFO which could be an additional risk factor as well as his afib  6)  P afib Plan -Continue on Xarelto per cardiology.  7) Patient Active Problem List   Diagnosis Date Noted  . History of loop recorder 06/12/2017  . Cryptogenic stroke ([place]) 10/28/2016  . Gait abnormality   . History of CVA (cerebrovascular accident)   . History of TIA (transient ischemic attack)   . Benign essential HTN   . Paroxysmal SVT (supraventricular tachycardia) ([place])   . Acute blood loss anemia   . Acute ischemic stroke ([place])   . CVA (cerebral vascular accident) ([place]) 10/26/2016  . History of recent stroke 06/06/2016  . 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 TIA (transient ischemic attack) 06/02/2016  . Numbness 06/01/2016  . Right arm numbness 06/01/2016  . Atherosclerosis of native coronary artery of native heart without angina pectoris 03/20/2016  . Hypertension   . GERD (gastroesophageal reflux disease)   . Gout   . S/P RF ablation operation for arrhythmia 12/13/2014  . Hyperlipidemia 12/13/2014  . Controlled type 2 diabetes mellitus with complication, without long-term current use of insulin ([place]) 12/07/2014   PLAN -Continue f/u with PCP (blood sugars)  8) Lower extremity discomfort - improved, stable. Likely Discogenic pain -[place] on 1/19 revealed no blood clot -Pt has established care with orthopedist and PT.   RTC with Dr Irene Limbo in 4 weeks Labs 1 week prior to clinic visit   All of the patients questions were answered with apparent satisfaction. The patient knows to call the clinic with any problems, questions or concerns.  . The total time spent in the appointment was 25 minutes and more than 50% was on  counseling and direct patient cares.      Sullivan Lone [name] Murphy AAHIVMS [facility] Hematology/Oncology Physician [facility]  (Office):       [phone] (Work cell):  [phone] (Fax):           [phone]  I, Joslyn Devon, am acting as scribe for .Brunetta Genera, [name]  .I have reviewed the above documentation for accuracy and completeness, and I agree with the above.  Brunetta Genera [name]

## 2018-05-27 ENCOUNTER — Ambulatory Visit (INDEPENDENT_AMBULATORY_CARE_PROVIDER_SITE_OTHER): Payer: Medicare Other | Admitting: Neurology

## 2018-05-27 ENCOUNTER — Inpatient Hospital Stay (HOSPITAL_BASED_OUTPATIENT_CLINIC_OR_DEPARTMENT_OTHER): Payer: Medicare Other | Admitting: Hematology

## 2018-05-27 ENCOUNTER — Encounter: Payer: Self-pay | Admitting: Neurology

## 2018-05-27 ENCOUNTER — Telehealth: Payer: Self-pay | Admitting: Hematology

## 2018-05-27 VITALS — BP 112/62 | HR 57 | Temp 98.1°F | Resp 17 | Ht 68.0 in | Wt 167.2 lb

## 2018-05-27 VITALS — BP 111/63 | HR 55 | Ht 68.0 in | Wt 166.6 lb

## 2018-05-27 DIAGNOSIS — R197 Diarrhea, unspecified: Secondary | ICD-10-CM | POA: Diagnosis not present

## 2018-05-27 DIAGNOSIS — M549 Dorsalgia, unspecified: Secondary | ICD-10-CM

## 2018-05-27 DIAGNOSIS — D6959 Other secondary thrombocytopenia: Secondary | ICD-10-CM | POA: Diagnosis not present

## 2018-05-27 DIAGNOSIS — I4891 Unspecified atrial fibrillation: Secondary | ICD-10-CM

## 2018-05-27 DIAGNOSIS — R21 Rash and other nonspecific skin eruption: Secondary | ICD-10-CM

## 2018-05-27 DIAGNOSIS — I480 Paroxysmal atrial fibrillation: Secondary | ICD-10-CM

## 2018-05-27 DIAGNOSIS — D6481 Anemia due to antineoplastic chemotherapy: Secondary | ICD-10-CM

## 2018-05-27 DIAGNOSIS — C9000 Multiple myeloma not having achieved remission: Secondary | ICD-10-CM | POA: Diagnosis not present

## 2018-05-27 DIAGNOSIS — Z85828 Personal history of other malignant neoplasm of skin: Secondary | ICD-10-CM | POA: Diagnosis not present

## 2018-05-27 DIAGNOSIS — G8929 Other chronic pain: Secondary | ICD-10-CM | POA: Diagnosis not present

## 2018-05-27 DIAGNOSIS — D649 Anemia, unspecified: Secondary | ICD-10-CM

## 2018-05-27 DIAGNOSIS — Z7901 Long term (current) use of anticoagulants: Secondary | ICD-10-CM

## 2018-05-27 DIAGNOSIS — I639 Cerebral infarction, unspecified: Secondary | ICD-10-CM | POA: Diagnosis not present

## 2018-05-27 DIAGNOSIS — Z8673 Personal history of transient ischemic attack (TIA), and cerebral infarction without residual deficits: Secondary | ICD-10-CM | POA: Diagnosis not present

## 2018-05-27 DIAGNOSIS — G629 Polyneuropathy, unspecified: Secondary | ICD-10-CM | POA: Diagnosis not present

## 2018-05-27 DIAGNOSIS — T451X5A Adverse effect of antineoplastic and immunosuppressive drugs, initial encounter: Secondary | ICD-10-CM

## 2018-05-27 MED FILL — NINLARO 4 MG CAP: 4 | 28 days supply | Qty: 3 | Fill #2

## 2018-05-27 NOTE — Telephone Encounter (Signed)
Appointments scheduled AVS/Calendar printed per 6/5 los °

## 2018-05-27 NOTE — Patient Instructions (Signed)
I had a long d/w patient about his remote stroke,diagnosis of paroxysmal AFIB, risk for recurrent stroke/TIAs, personally independently reviewed imaging studies and stroke evaluation results and answered questions.Continue Xarelto (rivaroxaban) daily  for secondary stroke prevention and maintain strict control of hypertension with blood pressure goal below 130/90, diabetes with hemoglobin A1c goal below 6.5% and lipids with LDL cholesterol goal below 70 mg/dL. I also advised the patient to eat a healthy diet with plenty of whole grains, cereals, fruits and vegetables, exercise regularly and maintain ideal body weight.  Follow-up with Dr. Debara Pickett for his paroxysmal A. fib and with Dr.Kale for his multiple myeloma.  No routine scheduled follow-up with me is necessary but may be referred back as needed

## 2018-05-27 NOTE — Progress Notes (Signed)
[facility] [address] [place]. [place] [address] [phone]       OFFICE FOLLOW-UP NOTE  [name] Date of Birth:  [birth year] Medical Record Number:  [id number]   HPI:   Office visit 09/03/2016 : 77 year old male seen today for first office follow-up visit for hospital admission for TIA in June 2017 Justin [name] is a 77 y.o. male with a history of SVT who presented to Hospital in [place] 2 weeks ago with left arm and facial numbness. MRI revealed 2 tiny areas of infarct in the same distribution. I'm not sure what workup was done at that time, but he did have a CTA head and neck as well as MRI. Oncology 06/01/16, he had sudden onset right arm and face numbness. It resolved over the course of approximately 30 minutes. He states that this was similar to the episode that he had prior, except that is on the opposite side. It is completely resolved at this point. He denies any difficulty with speech, or weakness. LKW: 1630 on 06/01/16 tpa given?: no, resolved symptoms . MRI scan the brain showed no acute process but showed old bilateral basal ganglia lacunar infarcts and moderate changes of chronic small vessel disease. 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 MRA of the brain showed no large vessel occlusion or stenosis. Carotid Doppler showed no significant extracranial stenosis. Transthoracic echo and transesophageal echo both did not show any cardiac source of embolism. TEE showed a small PFO with bidirectional shunt fissures not felt to be of clinical significance LDL cholesterol 52 mg percent. Hemoglobin A1c was elevated at 7.5. Patient was previously on aspirin which was switched to Plavix for stroke prevention. He states his done well. He states that  2 weeks prior to his admission to [facility] for TIA he was admitted in [facility] with the right MCA branch  embolic strokes following which was started on aspirin. He is now tolerating Plavix well without bleeding or bruising. He is also on Lipitor which is tolerating well without muscle aches and pains. He states his blood pressure is well controlled and today it is 110/70. He states his fasting sugars have been better now and he plans to repeat hemoglobin A1c next week. He has no new complaints today. He had a loop recorder inserted on 6/13 117 and so for paroxysmal atrial fibrillation has not yet been to detected. Update 11/11/2016 ;  patient is worked in urgently to is seen following recent hospitalization for recurrent stroke. He was readmitted on 11/ 3/17 with   new onset loss of control of his left lower extremity with weakness and numbness. Onset was at 8:30 PM tonight. He's been taking Plavix 75 mg per day. CT scan of his head showed old bilateral basal ganglia infarctions, but no acute findings. NIH stroke score was 3. Patient was deemed a candidate for TPA because of the significant disability, with inability to bear weight weight and control movement of his left lower extremity acutely. LSN:8:30 PM on 10/25/2016 tPA Given:Yes. Patient was admitted to the neuro ICU and closely monitored with strict blood pressure control. Symptoms resolved quickly after TPA. MRI scan showed punctate white matter infarcts in the right parietal and frontal white matter. Carotid Doppler showed no significant expectoration stenosis. Bilateral lower extremity venous Dopplers were negative for DVT. Transthoracic echo showed normal ejection fraction. Patient had a CT scan of the chest abdomen and  pelvis to look for malignancy which wasn't negative. MRI of the lumbar spine showed no significant degenerative changes. CT) of the neck showed no significant large vessel stenosis or occlusion. Patient had a loop recorder implanted in the prior admission which was interrogated and did not show paroxysmal A. fib. TEE was not repeated as  done in the previous admission had shown a PFO which was small. LDL cholesterol was 55 mg percent and hemoglobin A1c was 6.7. Patient was on Plavix prior to admission and aspirin was added with plan for dual antiplatelet therapy for 3 months followed by Plavix alone. Patient did not show interest in either endovascular PFO closure on participation in the [place] trial. He states his done well since discharge his leg weakness and walking are much better. Is getting home physical and occupational therapy. Is tolerating aspirin and Plavix without significant bruising or bleeding. His blood pressure is well controlled and today it is 120/time foot. He is tolerating his statin well without muscle aches or pains. His fasting sugars are pretty good and below 100. In fact he  has no complaints Update 05/27/2017 : He returns for follow-up after last visit 6 months ago. He continues to do well without recurrent stroke or TIA symptoms. He has discontinued Plavix a few weeks ago and is currently on aspirin alone. Patient has had no recurrent neurological symptoms. He however has been diagnosed with right inguinal hernia which is bothering him. He plans to see a surgeon soon and schedule surgery. 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 He is currently seeing hematologist Dr. Irene Limbo for multiple myeloma treatment  Update 05/27/2018 : He returns for follow-up after last visit a year ago.  He has not had recurrent stroke or TIA symptoms but was found to have a brief episode of paroxysmal atrial fibrillation in December 2018 on his loop recorder.  He has since been switched from aspirin to Xarelto and seems to be tolerating it well without significant bleeding but does bruise easily.  He does have a dog which scratches him.  He is been treated for his multiple myeloma by Dr. Velvet Bathe and seems happy with the results as his M  spike seems to be coming down.  He states his blood pressure is well controlled and today it is 1 one 8/85.  He remains on Lipitor which is tolerating well without muscle aches and pains.  He had    follow-up lipid profile done on 05/01/2018 which showed total cholesterol 146 and LDL cholesterol 53 mg percent.  He states his sugars are all well controlled he cannot tell me what his last A1c was. ROS:   14 system review of systems is positive for   diarrhea, frequency of urination, dizziness, walking difficulty, rash, itching, petechiae only and all other systems negative  PMH:  Past Medical History:  Diagnosis Date  . Anemia   . Arthritis   . 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 Diabetes mellitus without complication ([place])   . GERD (gastroesophageal reflux disease)   . Gout   . History of loop recorder   . Hypertension   . Left leg weakness   . Paroxysmal SVT (supraventricular tachycardia) ([place])    a. s/p RFCA on 05/01/15  . Stroke [facility])    TIA and mild stroke    Social History:  Social History   Socioeconomic History  . Marital status: Married    Spouse name: Not on file  . Number of children: Not on file  . Years of education: Not on file  . Highest education level: Not on file  Occupational History  . Not on file  Social Needs  . Financial resource strain: Not on file  . Food insecurity:    Worry: Not on file    Inability: Not on file  . Transportation needs:    Medical: Not on file    Non-medical: Not on file  Tobacco Use  . Smoking status: Never Smoker  . Smokeless tobacco: Never Used  Substance and Sexual Activity  . Alcohol use: No  . Drug use: No  . Sexual activity: Not on file  Lifestyle  . Physical activity:    Days per week: Not on file    Minutes per session: Not on file  . Stress: Not on file  Relationships  . Social connections:    Talks on phone: Not on file    Gets together: Not on file    Attends religious  service: Not on file    Active member of club or organization: Not on file    Attends meetings of clubs or organizations: Not on file    Relationship status: Not on file  . Intimate partner violence:    Fear of current or ex partner: Not on file    Emotionally abused: Not on file    Physically abused: Not on file    Forced sexual activity: Not on file  Other Topics Concern  . Not on file  Social History Narrative  . Not on file    Medications:   Current Outpatient Medications on File Prior to Visit  Medication Sig Dispense Refill  . acyclovir (ZOVIRAX) 400 MG tablet Take 1 tablet (400 mg total) by mouth daily. 90 tablet 0  . allopurinol (ZYLOPRIM) 300 MG tablet Take 300 mg by mouth daily.     [name] Kitchen atorvastatin (LIPITOR) 40 MG tablet Take 1 tablet (40 mg total) by mouth every morning. 90 tablet 3  . cyanocobalamin 1000 MCG tablet Take 1,000 mcg by mouth daily.    [name] Kitchen dexamethasone (DECADRON) 4 MG tablet Take 5 tablets (13m) by mouth once weekly. Take on days 1, 8, 15, and 21 of every 28day cycle 60 tablet 0  . fluorouracil (EFUDEX) 5 % cream Apply 1 application topically daily as needed (for skin cancer prevention).   0  . glucose monitoring kit (FREESTYLE) monitoring kit 1 each by Does not apply route as needed for other.    . lenalidomide (REVLIMID) 15 MG capsule Take 1 capsule (15 mg total) by mouth daily. Take for 21 days on, 7 days off, repeat every 28 days. Authorization ##[id number] 21 capsule 0  . metFORMIN (GLUCOPHAGE) 1000 MG tablet Take 1,000 mg by mouth daily with breakfast.    . NINLARO 4 MG capsule TAKE 1 CAPSULE BY MOUTH ONCE WEEKLY ON DAYS 1, 8, AND 15, OF EACH 28 DAY CYCLE. TAKE ON AN EMPTY STOMACH 1HR BEFORE OR 2HRS AFTER FOOD. 3 capsule 2  . nitroGLYCERIN (NITROSTAT) 0.4 MG SL tablet Place 1 tablet (0.4 mg total) under the tongue every 5 (five) minutes as needed for chest pain. 25 tablet 3  . omeprazole (PRILOSEC) 20 MG capsule Take 20 mg by mouth every morning.     .  rivaroxaban (XARELTO) 20 MG TABS tablet Take 1 tablet (20 mg total)  by mouth daily with supper. 90 tablet 2  . telmisartan-hydrochlorothiazide (MICARDIS HCT) 80-12.5 MG tablet Take 1 tablet by mouth daily. 90 tablet 3  . traMADol (ULTRAM) 50 MG tablet Take 1-2 tablets (50-100 mg total) by mouth every 6 (six) hours as needed for moderate pain or severe pain. 50 tablet 0  . HYDROcodone-acetaminophen (NORCO/VICODIN) 5-325 MG tablet hydrocodone 5 mg-acetaminophen 325 mg tablet     No current facility-administered medications on file prior to visit.     Allergies:   Allergies  Allergen Reactions  . No Known Allergies     Physical Exam General: well developed, well nourished elderly Caucasian male, seated, in no evident distress Head: head normocephalic and atraumatic.  Neck: supple with no carotid or supraclavicular bruits Cardiovascular: regular rate and rhythm, no murmurs Musculoskeletal: no deformity Skin:  no rash but scattered multiple petechiae.  Small subcutaneous hematoma on the right forearm which is covered with a Band-Aid   Vascular:  Normal pulses all extremities Vitals:   05/27/18 1000  BP: 111/63  Pulse: (!) 55   Neurologic Exam Mental Status: Awake and fully alert. Oriented to place and time. Recent and remote memory intact. Attention span, concentration and fund of knowledge appropriate. Mood and affect appropriate.  Cranial Nerves: Fundoscopic exam not done Pupils equal, briskly reactive to light. Extraocular movements full without nystagmus. Visual fields full to confrontation. Hearing intact. Facial sensation intact. Face, tongue, palate moves normally and symmetrically.  Motor: Normal bulk and tone. Normal strength in all tested extremity muscles. Sensory.: intact to touch ,pinprick .position and vibratory sensation.  Coordination: Rapid alternating movements normal in all extremities. Finger-to-nose and heel-to-shin performed accurately bilaterally. Gait and Station:  Arises from chair without difficulty. Stance is normal. Gait demonstrates normal stride length and balance . Able to heel, toe and tandem walk without difficulty.  Reflexes: 1+ and symmetric. Toes downgoing.      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 Vascular risk factors of diabetes, hypertension, hyperlipidemia.Small PFO unlikely to be of clinical significance. Recurrent right brain subcortical infarct likely from small vessel disease in November 2017 from which he has recovered well. Recent diagnosis of multiple myeloma and paroxysmal atrial fibrillation    PLAN: I had a long d/w patient about his remote stroke,diagnosis of paroxysmal AFIB, risk for recurrent stroke/TIAs, personally independently reviewed imaging studies and stroke evaluation results and answered questions.Continue Xarelto (rivaroxaban) daily  for secondary stroke prevention and maintain strict control of hypertension with blood pressure goal below 130/90, diabetes with hemoglobin A1c goal below 6.5% and lipids with LDL cholesterol goal below 70 mg/dL. I also advised the patient to eat a healthy diet with plenty of whole grains, cereals, fruits and vegetables, exercise regularly and maintain ideal body weight.  Follow-up with Dr. Debara Pickett for his paroxysmal A. fib and with Dr.Kale for his multiple myeloma.  No routine scheduled follow-up with me is necessary but may be referred back as needed.Greater than 50% of time during this 25 minute visit was spent on counseling,explanation of diagnosis, planning of further management, discussion with patient and family and coordination of care Antony Contras, MD  [facility] [address] [place], [place] [address]  Phone [phone] Fax [phone] Note: This document was prepared with digital  dictation and possible smart phrase technology. Any transcriptional errors that result from this process are unintentional

## 2018-05-28 LAB — MULTIPLE MYELOMA PANEL, SERUM
ALPHA 1: 0.3 g/dL (ref 0.0–0.4)
Albumin SerPl Elph-Mcnc: 3.6 g/dL (ref 2.9–4.4)
Albumin/Glob SerPl: 1.3 (ref 0.7–1.7)
Alpha2 Glob SerPl Elph-Mcnc: 0.7 g/dL (ref 0.4–1.0)
B-Globulin SerPl Elph-Mcnc: 0.9 g/dL (ref 0.7–1.3)
Gamma Glob SerPl Elph-Mcnc: 1 g/dL (ref 0.4–1.8)
Globulin, Total: 2.9 g/dL (ref 2.2–3.9)
IGA: 36 mg/dL — AB (ref 61–437)
IgG (Immunoglobin G), Serum: 1117 mg/dL (ref 700–1600)
IgM (Immunoglobulin M), Srm: 25 mg/dL (ref 15–143)
M Protein SerPl Elph-Mcnc: 0.7 g/dL — ABNORMAL HIGH
Total Protein ELP: 6.5 g/dL (ref 6.0–8.5)

## 2018-05-29 LAB — CUP PACEART REMOTE DEVICE CHECK
Implantable Pulse Generator Implant Date: 20170613
MDC IDC SESS DTM: 20190514181052

## 2018-06-01 ENCOUNTER — Other Ambulatory Visit: Payer: Medicare Other

## 2018-06-03 ENCOUNTER — Ambulatory Visit: Payer: Medicare Other | Admitting: Hematology

## 2018-06-08 ENCOUNTER — Ambulatory Visit (INDEPENDENT_AMBULATORY_CARE_PROVIDER_SITE_OTHER): Payer: Medicare Other | Admitting: *Deleted

## 2018-06-08 DIAGNOSIS — I639 Cerebral infarction, unspecified: Secondary | ICD-10-CM | POA: Diagnosis not present

## 2018-06-08 NOTE — Progress Notes (Signed)
Carelink Summary Report / Loop Recorder 

## 2018-06-17 ENCOUNTER — Inpatient Hospital Stay: Payer: Medicare Other

## 2018-06-17 DIAGNOSIS — D6481 Anemia due to antineoplastic chemotherapy: Secondary | ICD-10-CM | POA: Diagnosis not present

## 2018-06-17 DIAGNOSIS — G629 Polyneuropathy, unspecified: Secondary | ICD-10-CM | POA: Diagnosis not present

## 2018-06-17 DIAGNOSIS — D649 Anemia, unspecified: Secondary | ICD-10-CM

## 2018-06-17 DIAGNOSIS — I4891 Unspecified atrial fibrillation: Secondary | ICD-10-CM | POA: Diagnosis not present

## 2018-06-17 DIAGNOSIS — R21 Rash and other nonspecific skin eruption: Secondary | ICD-10-CM | POA: Diagnosis not present

## 2018-06-17 DIAGNOSIS — C9000 Multiple myeloma not having achieved remission: Secondary | ICD-10-CM

## 2018-06-17 DIAGNOSIS — D6959 Other secondary thrombocytopenia: Secondary | ICD-10-CM | POA: Diagnosis not present

## 2018-06-17 LAB — CMP (CANCER CENTER ONLY)
ALK PHOS: 70 U/L (ref 38–126)
ALT: 16 U/L (ref 0–44)
ANION GAP: 10 (ref 5–15)
AST: 25 U/L (ref 15–41)
Albumin: 3.8 g/dL (ref 3.5–5.0)
BUN: 31 mg/dL — ABNORMAL HIGH (ref 8–23)
CO2: 27 mmol/L (ref 22–32)
Calcium: 9.6 mg/dL (ref 8.9–10.3)
Chloride: 103 mmol/L (ref 98–111)
Creatinine: 1.2 mg/dL (ref 0.61–1.24)
GFR, Estimated: 56 mL/min — ABNORMAL LOW (ref >60)
Glucose, Bld: 107 mg/dL — ABNORMAL HIGH (ref 70–99)
Potassium: 3.9 mmol/L (ref 3.5–5.1)
Sodium: 140 mmol/L (ref 135–145)
TOTAL PROTEIN: 6.6 g/dL (ref 6.5–8.1)
Total Bilirubin: 0.6 mg/dL (ref 0.3–1.2)

## 2018-06-17 LAB — CBC WITH DIFFERENTIAL/PLATELET
BASOS ABS: 0 10*3/uL (ref 0.0–0.1)
Basophils Relative: 2 %
Eosinophils Absolute: 0.2 10*3/uL (ref 0.0–0.5)
Eosinophils Relative: 6 %
HEMATOCRIT: 34.5 % — AB (ref 38.4–49.9)
Hemoglobin: 11.3 g/dL — ABNORMAL LOW (ref 13.0–17.1)
LYMPHS ABS: 0.6 10*3/uL — AB (ref 0.9–3.3)
LYMPHS PCT: 22 %
MCH: 31 pg (ref 27.2–33.4)
MCHC: 32.7 g/dL (ref 32.0–36.0)
MCV: 94.7 fL (ref 79.3–98.0)
MONO ABS: 0.3 10*3/uL (ref 0.1–0.9)
Monocytes Relative: 11 %
NEUTROS ABS: 1.7 10*3/uL (ref 1.5–6.5)
Neutrophils Relative %: 59 %
Platelets: 56 10*3/uL — ABNORMAL LOW (ref 140–400)
RBC: 3.64 MIL/uL — ABNORMAL LOW (ref 4.20–5.82)
RDW: 19.6 % — AB (ref 11.0–14.6)
WBC: 2.9 10*3/uL — ABNORMAL LOW (ref 4.0–10.3)

## 2018-06-18 ENCOUNTER — Other Ambulatory Visit: Payer: Self-pay

## 2018-06-18 DIAGNOSIS — C9000 Multiple myeloma not having achieved remission: Secondary | ICD-10-CM

## 2018-06-18 MED ORDER — LENALIDOMIDE 15 MG PO CAPS: 15.0000 mg | ORAL_CAPSULE | Freq: Every day | ORAL | 0 refills | Status: DC

## 2018-06-19 LAB — MULTIPLE MYELOMA PANEL, SERUM
ALBUMIN SERPL ELPH-MCNC: 3.7 g/dL (ref 2.9–4.4)
ALBUMIN/GLOB SERPL: 1.5 (ref 0.7–1.7)
ALPHA 1: 0.2 g/dL (ref 0.0–0.4)
ALPHA2 GLOB SERPL ELPH-MCNC: 0.7 g/dL (ref 0.4–1.0)
B-Globulin SerPl Elph-Mcnc: 0.8 g/dL (ref 0.7–1.3)
GLOBULIN, TOTAL: 2.5 g/dL (ref 2.2–3.9)
Gamma Glob SerPl Elph-Mcnc: 0.8 g/dL (ref 0.4–1.8)
IGG (IMMUNOGLOBIN G), SERUM: 952 mg/dL (ref 700–1600)
IGM (IMMUNOGLOBULIN M), SRM: 21 mg/dL (ref 15–143)
IgA: 35 mg/dL — ABNORMAL LOW (ref 61–437)
M Protein SerPl Elph-Mcnc: 0.5 g/dL — ABNORMAL HIGH
Total Protein ELP: 6.2 g/dL (ref 6.0–8.5)

## 2018-06-22 NOTE — Progress Notes (Signed)
[name] Kitchen    HEMATOLOGY/ONCOLOGY CLINIC NOTE  Date of Service: 06/23/2018    Patient Care Team: Mayra Neer, MD as PCP - General (Family Medicine)  CHIEF COMPLAINTS/:   F/u for continued mx of Myeloma  HISTORY OF PRESENTING ILLNESS:   Justin [name] is a wonderful 77 y.o. 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  Patient has a history of hypertension, diabetes, GERD, gout, squamous cell skin cancers, elevated PSA, SVT status post ablation in May 2016, chronic back pain who has a history of recurrent CVA/TIA's. He apparently had a left hemispheric TIA June 2017 and has had recurrent) subcortical infarcts thought to be related to small vessel disease. He was noted to have a small PFO that was of questionable significance. He was initially on aspirin and then continued and aspirin + plavix for secondary stroke prevention. He follows with Dr. Antony Contras for his neurology cares.  An SPEP was done due to elevated total proteins of 8.9 and showed an M spike of 2 g/dL. He was also noted to have an elevated total protein of 8.6 in April 2017. He was referred to [place] for further evaluation of his M spike. Blood test did not reveal any hypercalcemia, no significant renal failure. He has been noted to have developed anemia since June the serum and his hemoglobin was 12.6 and is now down to the mid 10 range. He notes no focal bone pains at this time. Overt acute weight loss. No fevers no chills no night sweats.  CURRENT THERAPY:    Ixazomib+ Revlimid + Dexamethasone  INTERVAL HISTORY   Justin [name] presents to the office today for follow-up of his myeloma. The patient's last visit with [place] was on 05/27/18. The pt reports that he is doing well overall.   The pt reports some dizziness on head position changes. He is currently in his off week from Revlimid until July 4.   The pt notes a hard lesion on his right wrist which developed several  weeks ago. He presented to his PCP's clinic and took a course of antibiotics but hasn't returned for re-evaluation. He notes that his dog scratched him while playing and it developed into a mass. He denies any pus coming out of it, nor associated pain. He does not believe that anything was different about his wrist prior to the dog scratch. The pt has seen a surgeon in the past for a previous squamous cell removal from his bottom lip.   Lab results (06/17/18) of CBC w/diff, CMP is as follows: all values are WNL except for WBC at 2.9k, RBC at 3.64, HGB at 11.3, HCT at 34.5, RDW at 19.6, PLT at 56k, Lymphs abs at 600, Glucose at 107, BUN at 31, GFR at 56. SPEP 06/17/18 reveals IgA at 16, M Protein at 0.5  On review of systems, pt reports some head-position related dizziness, right wrist lesion, and denies any issues bleeding, nose bleeds, gum bleeds, and any other symptoms.   MEDICAL HISTORY:  Past Medical History:  Diagnosis Date  . Anemia   . Arthritis   . 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 Diabetes mellitus without complication ([place])   . GERD (gastroesophageal reflux disease)   . Gout   . History of loop recorder   . Hypertension   . Left leg weakness   . Paroxysmal SVT (supraventricular tachycardia) (HCC)  a. s/p RFCA on 05/01/15  . Stroke [facility])    TIA and mild stroke    SURGICAL HISTORY: Past Surgical History:  Procedure Laterality Date  . ATRIAL FIBRILLATION ABLATION     Dr. Lovena Le  . BASAL CELL CARCINOMA EXCISION     off of back  . ELECTROPHYSIOLOGIC STUDY N/A 05/01/2015   Procedure: SVT Ablation;  Surgeon: Evans Lance, MD;  Location: [place] [facility];  Service: Cardiovascular;  Laterality: N/A;  . EP IMPLANTABLE DEVICE N/A 06/04/2016   Procedure: Loop Recorder Insertion;  Surgeon: Thompson Grayer, MD;  Location: [place] [facility];  Service: Cardiovascular;  Laterality: N/A;  . INGUINAL HERNIA REPAIR Bilateral 06/16/2017   Procedure:  LAPAROSCOPIC BILATERAL INGUINAL HERNIA REPAIR;  Surgeon: Clovis Riley, MD;  Location: [place];  Service: General;  Laterality: Bilateral;  . INSERTION OF MESH Bilateral 06/16/2017   Procedure: INSERTION OF MESH;  Surgeon: Clovis Riley, MD;  Location: [place];  Service: General;  Laterality: Bilateral;  . SQUAMOUS CELL CARCINOMA EXCISION    . TEE WITHOUT CARDIOVERSION N/A 06/04/2016   Procedure: TRANSESOPHAGEAL ECHOCARDIOGRAM (TEE);  Surgeon: Pixie Casino, MD;  Location: [facility] ENDOSCOPY;  Service: Cardiovascular;  Laterality: N/A;    SOCIAL HISTORY: Social History   Socioeconomic History  . Marital status: Married    Spouse name: Not on file  . Number of children: Not on file  . Years of education: Not on file  . Highest education level: Not on file  Occupational History  . Not on file  Social Needs  . Financial resource strain: Not on file  . Food insecurity:    Worry: Not on file    Inability: Not on file  . Transportation needs:    Medical: Not on file    Non-medical: Not on file  Tobacco Use  . Smoking status: Never Smoker  . Smokeless tobacco: Never Used  Substance and Sexual Activity  . Alcohol use: No  . Drug use: No  . Sexual activity: Not on file  Lifestyle  . Physical activity:    Days per week: Not on file    Minutes per session: Not on file  . Stress: Not on file  Relationships  . Social connections:    Talks on phone: Not on file    Gets together: Not on file    Attends religious service: Not on file    Active member of club or organization: Not on file    Attends meetings of clubs or organizations: Not on file    Relationship status: Not on file  . Intimate partner violence:    Fear of current or ex partner: Not on file    Emotionally abused: Not on file    Physically abused: Not on file    Forced sexual activity: Not on file  Other Topics Concern  . Not on file  Social History Narrative  . Not on file    FAMILY HISTORY: Family History    Problem Relation Age of Onset  . Stroke Mother   . Hypertension Mother   . Alzheimer's disease Father   . Heart failure Father   . Down syndrome Daughter     ALLERGIES:  is allergic to no known allergies.  MEDICATIONS:  Current Outpatient Medications  Medication Sig Dispense Refill  . acyclovir (ZOVIRAX) 400 MG tablet Take 1 tablet (400 mg total) by mouth daily. 90 tablet 0  . allopurinol (ZYLOPRIM) 300 MG tablet Take 300 mg by mouth daily.     [name] Kitchen  atorvastatin (LIPITOR) 40 MG tablet Take 1 tablet (40 mg total) by mouth every morning. 90 tablet 3  . cyanocobalamin 1000 MCG tablet Take 1,000 mcg by mouth daily.    [name] Kitchen dexamethasone (DECADRON) 4 MG tablet Take 5 tablets (56m) by mouth once weekly. Take on days 1, 8, 15, and 21 of every 28day cycle 60 tablet 0  . fluorouracil (EFUDEX) 5 % cream Apply 1 application topically daily as needed (for skin cancer prevention).   0  . glucose monitoring kit (FREESTYLE) monitoring kit 1 each by Does not apply route as needed for other.    . HYDROcodone-acetaminophen (NORCO/VICODIN) 5-325 MG tablet hydrocodone 5 mg-acetaminophen 325 mg tablet    . lenalidomide (REVLIMID) 15 MG capsule Take 1 capsule (15 mg total) by mouth daily. Take for 21 days on, 7 days off, repeat every 28 days. Authorization [id number] 21 capsule 0  . metFORMIN (GLUCOPHAGE) 1000 MG tablet Take 1,000 mg by mouth daily with breakfast.    . NINLARO 4 MG capsule TAKE 1 CAPSULE BY MOUTH ONCE WEEKLY ON DAYS 1, 8, AND 15, OF EACH 28 DAY CYCLE. TAKE ON AN EMPTY STOMACH 1HR BEFORE OR 2HRS AFTER FOOD. 3 capsule 2  . nitroGLYCERIN (NITROSTAT) 0.4 MG SL tablet Place 1 tablet (0.4 mg total) under the tongue every 5 (five) minutes as needed for chest pain. 25 tablet 3  . omeprazole (PRILOSEC) 20 MG capsule Take 20 mg by mouth every morning.     . rivaroxaban (XARELTO) 20 MG TABS tablet Take 1 tablet (20 mg total) by mouth daily with supper. 90 tablet 2  . telmisartan-hydrochlorothiazide  (MICARDIS HCT) 80-12.5 MG tablet Take 1 tablet by mouth daily. 90 tablet 3  . traMADol (ULTRAM) 50 MG tablet Take 1-2 tablets (50-100 mg total) by mouth every 6 (six) hours as needed for moderate pain or severe pain. 50 tablet 0   No current facility-administered medications for this visit.     REVIEW OF SYSTEMS:    A 10+ POINT REVIEW OF SYSTEMS WAS OBTAINED including neurology, dermatology, psychiatry, cardiac, respiratory, lymph, extremities, GI, GU, Musculoskeletal, constitutional, breasts, reproductive, HEENT.  All pertinent positives are noted in the HPI.  All others are negative.  PHYSICAL EXAMINATION:   ECOG PERFORMANCE STATUS: 2 - Symptomatic, <50% confined to bed  Vitals:   06/23/18 1418  BP: 109/70  Pulse: 61  Resp: 18  Temp: 97.8 F (36.6 C)  SpO2: 100%   Filed Weights   06/23/18 1418  Weight: 172 lb (78 kg)   Body mass index is 26.15 kg/m.  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 No palpable hepatosplenomegaly.  Extremity: no pedal edema PSYCH: alert & oriented x 3 with fluent speech NEURO: no focal motor/sensory deficits    LABORATORY DATA:  I have reviewed the data as listed. CBC Latest Ref Rng & Units 06/24/2018 06/17/2018 05/25/2018  WBC 4.0 - 10.3 K/uL 3.0(L) 2.9(L) 3.9(L)  Hemoglobin 13.0 - 17.1 g/dL 11.7(L) 11.3(L) 12.0(L)  Hematocrit 38.4 - 49.9 % 35.9(L) 34.5(L) 37.2(L)  Platelets 140 - 400 K/uL  123(L) 56(L) 169  ANC 1.8k  CMP Latest  Ref Rng & Units 06/17/2018 05/25/2018 04/24/2018  Glucose 70 - 99 mg/dL 107(H) 95 124  BUN 8 - 23 mg/dL 31(H) 32(H) 23  Creatinine 0.61 - 1.24 mg/dL 1.20 1.49(H) 1.08  Sodium 135 - 145 mmol/L 140 138 139  Potassium 3.5 - 5.1 mmol/L 3.9 4.7 3.9  Chloride 98 - 111 mmol/L 103 105 104  CO2 22 - 32 mmol/L 27 23 29   Calcium 8.9 - 10.3 mg/dL 9.6 9.7 9.5  Total Protein 6.5 - 8.1 g/dL 6.6 7.0 7.4  Total Bilirubin 0.3 - 1.2 mg/dL 0.6 0.4 0.4  Alkaline Phos 38 - 126 U/L 70 59 65  AST 15 - 41 U/L 25 28 19   ALT 0 - 44 U/L 16 15 8     Lab Results  Component Value Date   IRON 45 06/02/2017   TIBC 233 06/02/2017   IRONPCTSAT 19 (L) 06/02/2017   (Iron and TIBC)  Lab Results  Component Value Date   FERRITIN 308 08/26/2017         RADIOGRAPHIC STUDIES: I have personally reviewed the radiological images as listed and agreed with the findings in the report.  Bone Marrow Biopsy 12/24/2016 (Accession [id number])  Diagnosis Bone Marrow, Aspirate,Biopsy, and Clot, left iliac crest - HYPERCELLULAR BONE MARROW FOR AGE WITH PLASMA CELL NEOPLASM. - SEE COMMENT. PERIPHERAL BLOOD: - NORMOCYTIC-NORMOCHROMIC ANEMIA. - LEUKOPENIA.  DG Bone Survey Met (Accession [id number]) (Order [id number])  Imaging  Date: 11/28/2016 Department: [facility]RAD[facility] Released By: Pricilla Riffle Authorizing: Brunetta Genera, MD  Exam Information   Status Exam Begun  Exam Ended   Final [99] 11/28/2016 11:59 AM 11/28/2016 12:36 PM  PACS Images   Show images for DG Bone Survey Met  Study Result   CLINICAL DATA:  Monoclonal gammopathy of unknown significance. History of squamous cell carcinoma excision, basal cell carcinoma excision.  EXAM: METASTATIC BONE SURVEY  COMPARISON:  CT neck, chest, abdomen and pelvis from 10/29/16, MRI of the head from 06/01/2016, CXR 10/27/2016, lumbar spine radiographs 06/24/2016  FINDINGS: Lateral skull: Small occipital lucency which may  represent a normal arachnoid granulation posteriorly in the occiput. No definite lytic abnormality.  Cervical spine AP and lateral: Disc space narrowing at C2-3, and from C4 through C7. C4-5, C5-6 and C6-7 uncovertebral joint spurring bilaterally. No lytic abnormality.  Thoracic spine AP and lateral: T8-9 right-sided osteophytes and to a lesser degree T9-10. No lytic abnormality. Slight multilevel lumbar thoracic disc space narrowing likely degenerative.  Lumbar spine AP and lateral: Disc space narrowing at L4-5 and to a greater extent L5-S1. No lytic disease. L3 through S1 facet sclerosis.  AP pelvis: Negative for lytic disease.  Bilateral upper and lower extremities shoulders through wrist and from the hips through ankle: Negative for lytic disease. Joint space narrowing of the femorotibial compartments both knees. Subchondral cyst of the right patella.  CXR: Clear lungs. Cardiac implantable monitoring device projects over the left heart. Aortic atherosclerosis. No lytic disease.  IMPRESSION: No findings suspicious for lytic disease.   Electronically Signed   By: Ashley Royalty M.D.   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 (12/2016) Peak M spike 2.6  CYtogenetics and Myeloma FISH panel.- trisomy 11 Patient has normocytic anemia without any other clear etiology. (>2g/dl lower that lower  limit of normal which is 13) which per criteria would place this in the Multiple myeloma category as opposed to Smoldering Multiple myeloma (Borderline criterion)  No overt renal failure or hypercalcemia at this time No focal bone pains though he has significant chronic back pain related to degenerative disc disease. Bone survey shows no overt lytic lesions.  2) anemia and thrombocytopenia -- related to treatment (Ixazomib + Revlimid)  3)  Occasional Rash of extremities and neuropathy, Mild diarrhea- secondary to treatment   PLAN  -MM panel shows M spike continues to decrease and is down to 0.5g/dl -I encouraged him to drink more water to stay hydrated, 64 ounces a day of non-alcohol, non-caffeine and non-carbonated liquids.   -Pt is tolerating Revlimid and Ninlaro well and has no prohibitive toxicities from continuing. -continue Revlimid 24m 3 weeks on 1 week off + Ninlaro D1,8,15 q28days. -Reviewed dosing schedule in detail with pt again and pt expressed clarity. He did have 1 cycle where he accidentally took both medications for all 4 weeks.  -Continue Xarelto (for Afib) which reduces the risk of blood clots from any of the treatments. -Will keep him up to date on his vaccines. He [place].  -Avoid heavy lifting and over-exertion at this time in view of recent concern of inguinal hernia.  -Discussed pt labwork from 06/17/18; PLT down to 56k. Hgb stable at 11.3 -Will collect repeat blood count tomorrow, July 3, before next cycle begins on July 4 to check PLT again -The pt has no prohibitive toxicities from continuing Ninlaro at this time.  -Hold Revlimid and Xarelto until right wrist lesion on right wrist can be excised    4) Borderline low B12 levels 299 --- on replacement - 04/2018 B12 level now improved to 724 -continue replacement.  5) recurrent CVA - thought to be related to small vessel disease as per neurology. Has a small PFO which could be an additional risk factor as well as his afib  6)  P afib Plan -Continue on Xarelto per cardiology.  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 -Advised that if the  surgeon is not able to see the pt back soon, the pt will need to present to the ED  -Recommend pt call his dermatologist and be seen very soon   8) Patient Active Problem List   Diagnosis Date Noted  . History of loop recorder 06/12/2017  . [place]ogenic stroke [place] 10/28/2016  . Gait abnormality   . History of CVA (cerebrovascular accident)   . History of TIA (transient ischemic attack)   . Benign essential HTN   . Paroxysmal SVT ([place]raventricular tachycardia) ([place]   . Acute blood loss anemia   . Acute ischemic stroke ([place]   . CVA (cerebral vascular accident) ([place] 10/26/2016  . History of recent stroke 06/06/2016  . 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 TIA (transient ischemic attack) 06/02/2016  . Numbness 06/01/2016  . Right arm numbness 06/01/2016  . Atherosclerosis of native coronary artery of native heart without angina pectoris 03/20/2016  . Hypertension   . GERD (gastroesophageal reflux disease)   . Gout   . S/P RF ablation operation for arrhythmia 12/13/2014  . Hyperlipidemia 12/13/2014  .  Controlled type 2 diabetes mellitus with complication, without long-term current use of insulin ([place]) 12/07/2014   PLAN -Continue f/u with PCP (blood sugars)  8) Lower extremity discomfort - improved, stable. Likely Discogenic pain -[place] on 1/19 revealed no blood clot -Pt has established care with orthopedist and PT.   Labs in tommorrow RTC with Dr Irene Limbo with labs in 4 weeks    All of the patients questions were answered with apparent satisfaction. The patient knows to call the clinic with any problems, questions or concerns.  The total time spent in the appt was 30 minutes and more than 50% was on counseling and direct patient cares.      Sullivan Lone MD [place] AAHIVMS [facility] Hematology/Oncology Physician [facility]  (Office):       [phone] (Work cell):  [phone] (Fax):           [phone]  I, Baldwin [place], am acting as a scribe for Dr  Irene Limbo.   .I have reviewed the above documentation for accuracy and completeness, and I agree with the above. Brunetta Genera MD

## 2018-06-23 ENCOUNTER — Inpatient Hospital Stay: Payer: Medicare Other | Attending: Hematology | Admitting: Hematology

## 2018-06-23 ENCOUNTER — Other Ambulatory Visit: Payer: Self-pay | Admitting: Hematology

## 2018-06-23 ENCOUNTER — Encounter: Payer: Self-pay | Admitting: Hematology

## 2018-06-23 ENCOUNTER — Telehealth: Payer: Self-pay | Admitting: Hematology

## 2018-06-23 VITALS — BP 109/70 | HR 61 | Temp 97.8°F | Resp 18 | Ht 68.0 in | Wt 172.0 lb

## 2018-06-23 DIAGNOSIS — R2231 Localized swelling, mass and lump, right upper limb: Secondary | ICD-10-CM

## 2018-06-23 DIAGNOSIS — I10 Essential (primary) hypertension: Secondary | ICD-10-CM

## 2018-06-23 DIAGNOSIS — G629 Polyneuropathy, unspecified: Secondary | ICD-10-CM | POA: Diagnosis not present

## 2018-06-23 DIAGNOSIS — Z85828 Personal history of other malignant neoplasm of skin: Secondary | ICD-10-CM | POA: Diagnosis not present

## 2018-06-23 DIAGNOSIS — C9000 Multiple myeloma not having achieved remission: Secondary | ICD-10-CM

## 2018-06-23 DIAGNOSIS — I480 Paroxysmal atrial fibrillation: Secondary | ICD-10-CM

## 2018-06-23 DIAGNOSIS — R21 Rash and other nonspecific skin eruption: Secondary | ICD-10-CM | POA: Diagnosis not present

## 2018-06-23 DIAGNOSIS — R197 Diarrhea, unspecified: Secondary | ICD-10-CM | POA: Diagnosis not present

## 2018-06-23 DIAGNOSIS — E119 Type 2 diabetes mellitus without complications: Secondary | ICD-10-CM

## 2018-06-23 DIAGNOSIS — D6481 Anemia due to antineoplastic chemotherapy: Secondary | ICD-10-CM

## 2018-06-23 DIAGNOSIS — L989 Disorder of the skin and subcutaneous tissue, unspecified: Secondary | ICD-10-CM

## 2018-06-23 DIAGNOSIS — Z7901 Long term (current) use of anticoagulants: Secondary | ICD-10-CM | POA: Diagnosis not present

## 2018-06-23 DIAGNOSIS — D696 Thrombocytopenia, unspecified: Secondary | ICD-10-CM

## 2018-06-23 DIAGNOSIS — T451X5A Adverse effect of antineoplastic and immunosuppressive drugs, initial encounter: Secondary | ICD-10-CM

## 2018-06-23 DIAGNOSIS — Z8673 Personal history of transient ischemic attack (TIA), and cerebral infarction without residual deficits: Secondary | ICD-10-CM | POA: Diagnosis not present

## 2018-06-23 DIAGNOSIS — D6959 Other secondary thrombocytopenia: Secondary | ICD-10-CM

## 2018-06-23 DIAGNOSIS — D649 Anemia, unspecified: Secondary | ICD-10-CM

## 2018-06-23 NOTE — Telephone Encounter (Signed)
Appointments scheduled AVS/Calendar printed per 7/2 los °

## 2018-06-24 ENCOUNTER — Other Ambulatory Visit: Payer: Medicare Other

## 2018-06-24 ENCOUNTER — Inpatient Hospital Stay: Payer: Medicare Other

## 2018-06-24 ENCOUNTER — Telehealth: Payer: Self-pay

## 2018-06-24 DIAGNOSIS — C9000 Multiple myeloma not having achieved remission: Secondary | ICD-10-CM | POA: Diagnosis not present

## 2018-06-24 DIAGNOSIS — I480 Paroxysmal atrial fibrillation: Secondary | ICD-10-CM | POA: Diagnosis not present

## 2018-06-24 DIAGNOSIS — D6481 Anemia due to antineoplastic chemotherapy: Secondary | ICD-10-CM | POA: Diagnosis not present

## 2018-06-24 DIAGNOSIS — R2231 Localized swelling, mass and lump, right upper limb: Secondary | ICD-10-CM | POA: Diagnosis not present

## 2018-06-24 DIAGNOSIS — G629 Polyneuropathy, unspecified: Secondary | ICD-10-CM | POA: Diagnosis not present

## 2018-06-24 DIAGNOSIS — D6959 Other secondary thrombocytopenia: Secondary | ICD-10-CM | POA: Diagnosis not present

## 2018-06-24 LAB — CBC WITH DIFFERENTIAL/PLATELET
BASOS ABS: 0 10*3/uL (ref 0.0–0.1)
Basophils Relative: 1 %
Eosinophils Absolute: 0.1 10*3/uL (ref 0.0–0.5)
Eosinophils Relative: 4 %
HCT: 35.9 % — ABNORMAL LOW (ref 38.4–49.9)
Hemoglobin: 11.7 g/dL — ABNORMAL LOW (ref 13.0–17.1)
LYMPHS ABS: 0.9 10*3/uL (ref 0.9–3.3)
LYMPHS PCT: 28 %
MCH: 31.1 pg (ref 27.2–33.4)
MCHC: 32.6 g/dL (ref 32.0–36.0)
MCV: 95.5 fL (ref 79.3–98.0)
MONO ABS: 0.3 10*3/uL (ref 0.1–0.9)
Monocytes Relative: 9 %
NEUTROS ABS: 1.8 10*3/uL (ref 1.5–6.5)
Neutrophils Relative %: 58 %
Platelets: 123 10*3/uL — ABNORMAL LOW (ref 140–400)
RBC: 3.76 MIL/uL — ABNORMAL LOW (ref 4.20–5.82)
RDW: 17.1 % — AB (ref 11.0–14.6)
WBC: 3 10*3/uL — ABNORMAL LOW (ref 4.0–10.3)

## 2018-06-24 NOTE — Telephone Encounter (Signed)
Patient called to clarify his medication instructions from Dr. Irene Limbo. Confirmed with Dr. Irene Limbo that patient is to hold Revlimid and continue Ninlaro. Patient verbalized understanding.

## 2018-06-26 ENCOUNTER — Telehealth: Payer: Self-pay | Admitting: *Deleted

## 2018-06-26 NOTE — Telephone Encounter (Signed)
Patient calling to let dr Irene Limbo know that surgeon dr Tora Duck did a hernia repair in may of 2018. States he was pleased with her.

## 2018-06-29 MED FILL — NINLARO 4 MG CAP: 4 | 28 days supply | Qty: 3 | Fill #0

## 2018-07-01 ENCOUNTER — Other Ambulatory Visit: Payer: Self-pay | Admitting: Hematology

## 2018-07-02 ENCOUNTER — Telehealth: Payer: Self-pay

## 2018-07-02 ENCOUNTER — Other Ambulatory Visit: Payer: Self-pay | Admitting: Hematology

## 2018-07-02 DIAGNOSIS — R2231 Localized swelling, mass and lump, right upper limb: Secondary | ICD-10-CM

## 2018-07-02 NOTE — Telephone Encounter (Signed)
Per message received from Dr. Irene Limbo, called the patient to inform him that a referral has been placed for surgery with Dr. Kae Heller. Patient verbalized understanding. Scheduling message sent about referral for surgery per Dr. Irene Limbo.

## 2018-07-06 ENCOUNTER — Telehealth: Payer: Self-pay

## 2018-07-06 NOTE — Telephone Encounter (Signed)
Patient called requesting to know who Dr. Irene Limbo referred him to for surgery consult. Informed the patient that referral was to Dr. Romana Juniper at [facility] and that the referral has been sent.  Patient verbalized understanding and stated that he has not been contacted by [facility] and will call them to set up an appointment.

## 2018-07-09 ENCOUNTER — Ambulatory Visit (INDEPENDENT_AMBULATORY_CARE_PROVIDER_SITE_OTHER): Payer: Medicare Other | Admitting: *Deleted

## 2018-07-09 DIAGNOSIS — I639 Cerebral infarction, unspecified: Secondary | ICD-10-CM

## 2018-07-10 LAB — CUP PACEART REMOTE DEVICE CHECK
Date Time Interrogation Session: 20190616181009
MDC IDC PG IMPLANT DT: 20170613

## 2018-07-10 NOTE — Progress Notes (Signed)
Carelink Summary Report / Loop Recorder 

## 2018-07-16 ENCOUNTER — Ambulatory Visit: Payer: Self-pay | Admitting: Surgery

## 2018-07-16 DIAGNOSIS — L989 Disorder of the skin and subcutaneous tissue, unspecified: Secondary | ICD-10-CM | POA: Diagnosis not present

## 2018-07-16 NOTE — H&P (Signed)
Surgical H&P  CC: right forearm lesion  HPI: this is a very nice 77 year old man, known to me following laparoscopic bilateral inguinal hernia repair a year ago, who is referred for biopsy of a right forearm mass.  He noticed it about 3 months ago after his large puppy dog jumped up and scratched him on the arm.  He states prior to this there was no lesion whatsoever in the area however he has had multiple other scratches on his arms that have all healed but this was developed into a hard nodule that has increased in size and failed to heal despite attempted I&D and oral antibiotics.  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  Allergies  Allergen Reactions  . No Known Allergies     Past Medical History:  Diagnosis Date  . Anemia   . Arthritis   . 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 Diabetes mellitus without complication ([place])   . GERD (gastroesophageal reflux disease)   . Gout   . History of loop recorder   . Hypertension   . Left leg weakness   . Paroxysmal SVT (supraventricular tachycardia) ([place])    a. s/p RFCA on 05/01/15  . Stroke [facility])    TIA and mild stroke    Past Surgical History:  Procedure Laterality Date  . ATRIAL FIBRILLATION ABLATION     Dr. Lovena Le  . BASAL CELL CARCINOMA EXCISION     off of back  . ELECTROPHYSIOLOGIC STUDY N/A 05/01/2015   Procedure: SVT Ablation;  Surgeon: Evans Lance, MD;  Location: [place] [facility];  Service: Cardiovascular;  Laterality: N/A;  . EP IMPLANTABLE DEVICE N/A 06/04/2016   Procedure: Loop Recorder Insertion;  Surgeon: Thompson Grayer, MD;  Location: [place] [facility];  Service: Cardiovascular;  Laterality: N/A;  . INGUINAL HERNIA REPAIR Bilateral 06/16/2017   Procedure: LAPAROSCOPIC BILATERAL INGUINAL HERNIA REPAIR;  Surgeon: Clovis Riley, MD;  Location: [place];  Service: General;  Laterality: Bilateral;  . INSERTION OF MESH  Bilateral 06/16/2017   Procedure: INSERTION OF MESH;  Surgeon: Clovis Riley, MD;  Location: [place];  Service: General;  Laterality: Bilateral;  . SQUAMOUS CELL CARCINOMA EXCISION    . TEE WITHOUT CARDIOVERSION N/A 06/04/2016   Procedure: TRANSESOPHAGEAL ECHOCARDIOGRAM (TEE);  Surgeon: Pixie Casino, MD;  Location: [facility] ENDOSCOPY;  Service: Cardiovascular;  Laterality: N/A;    Family History  Problem Relation Age of Onset  . Stroke Mother   . Hypertension Mother   . Alzheimer's disease Father   . Heart failure Father   . Down syndrome Daughter     Social History   Socioeconomic History  . Marital status: Married    Spouse name: Not on file  . Number of children: Not on file  . Years of education: Not on file  . Highest education level: Not on file  Occupational History  . Not on file  Social Needs  . Financial resource strain: Not on file  . Food insecurity:    Worry: Not on file    Inability: Not on file  . Transportation needs:    Medical: Not on file    Non-medical: Not on file  Tobacco Use  . Smoking status: Never Smoker  . Smokeless tobacco: Never Used  Substance and Sexual Activity  . Alcohol use: No  . Drug use: No  . Sexual activity:  Not on file  Lifestyle  . Physical activity:    Days per week: Not on file    Minutes per session: Not on file  . Stress: Not on file  Relationships  . Social connections:    Talks on phone: Not on file    Gets together: Not on file    Attends religious service: Not on file    Active member of club or organization: Not on file    Attends meetings of clubs or organizations: Not on file    Relationship status: Not on file  Other Topics Concern  . Not on file  Social History Narrative  . Not on file    Current Outpatient Medications on File Prior to Visit  Medication Sig Dispense Refill  . acyclovir (ZOVIRAX) 400 MG tablet Take 1 tablet (400 mg total) by mouth daily. 90 tablet 0  . allopurinol (ZYLOPRIM) 300 MG tablet  Take 300 mg by mouth daily.     [name] Kitchen atorvastatin (LIPITOR) 40 MG tablet Take 1 tablet (40 mg total) by mouth every morning. 90 tablet 3  . cyanocobalamin 1000 MCG tablet Take 1,000 mcg by mouth daily.    [name] Kitchen dexamethasone (DECADRON) 4 MG tablet Take 5 tablets (82m) by mouth once weekly. Take on days 1, 8, 15, and 21 of every 28day cycle 60 tablet 0  . fluorouracil (EFUDEX) 5 % cream Apply 1 application topically daily as needed (for skin cancer prevention).   0  . glucose monitoring kit (FREESTYLE) monitoring kit 1 each by Does not apply route as needed for other.    . HYDROcodone-acetaminophen (NORCO/VICODIN) 5-325 MG tablet hydrocodone 5 mg-acetaminophen 325 mg tablet    . lenalidomide (REVLIMID) 15 MG capsule Take 1 capsule (15 mg total) by mouth daily. Take for 21 days on, 7 days off, repeat every 28 days. Authorization [id number] 21 capsule 0  . metFORMIN (GLUCOPHAGE) 1000 MG tablet Take 1,000 mg by mouth daily with breakfast.    . NINLARO 4 MG capsule TAKE 1 CAPSULE (4MG) BY MOUTH ONCE WEEKLY ON DAYS 1, 8, AND 15, OF EACH 28 DAY CYCLE. TAKE ON AN EMPTY STOMACH 1HR BEFORE OR 2HRS AFTER FOOD 3 capsule 2  . nitroGLYCERIN (NITROSTAT) 0.4 MG SL tablet Place 1 tablet (0.4 mg total) under the tongue every 5 (five) minutes as needed for chest pain. 25 tablet 3  . omeprazole (PRILOSEC) 20 MG capsule Take 20 mg by mouth every morning.     . rivaroxaban (XARELTO) 20 MG TABS tablet Take 1 tablet (20 mg total) by mouth daily with supper. 90 tablet 2  . telmisartan-hydrochlorothiazide (MICARDIS HCT) 80-12.5 MG tablet Take 1 tablet by mouth daily. 90 tablet 3  . traMADol (ULTRAM) 50 MG tablet Take 1-2 tablets (50-100 mg total) by mouth every 6 (six) hours as needed for moderate pain or severe pain. 50 tablet 0   No current facility-administered medications on file prior to visit.     Review of Systems: a complete, 10pt review of systems was completed with pertinent positives and negatives as documented  in the HPI  Physical Exam: There were no vitals filed for this visit.  Gen: alert and well appearing Eye: extraocular motion intact, no scleral icterus ENT: moist mucus membranes, dentition intact Neck: no mass or thyromegaly Chest: unlabored respirations, symmetrical air entry, clear bilaterally CV: regular rate and rhythm, no pedal edema Abdomen: soft, nontender, nondistended. No mass or organomegaly MSK: strength symmetrical throughout, no deformity Neuro: grossly intact, normal gait  Psych: normal mood and affect, appropriate insight Skin: warm and dry, 2 cm elevated soft nodule with overlying eschar and 5 mm border of reactive erythema, mildly tender, no fluctuance or drainage.   CBC Latest Ref Rng & Units 06/24/2018 06/17/2018 05/25/2018  WBC 4.0 - 10.3 K/uL 3.0(L) 2.9(L) 3.9(L)  Hemoglobin 13.0 - 17.1 g/dL 11.7(L) 11.3(L) 12.0(L)  Hematocrit 38.4 - 49.9 % 35.9(L) 34.5(L) 37.2(L)  Platelets 140 - 400 K/uL 123(L) 56(L) 169    CMP Latest Ref Rng & Units 06/17/2018 05/25/2018 04/24/2018  Glucose 70 - 99 mg/dL 107(H) 95 124  BUN 8 - 23 mg/dL 31(H) 32(H) 23  Creatinine 0.61 - 1.24 mg/dL 1.20 1.49(H) 1.08  Sodium 135 - 145 mmol/L 140 138 139  Potassium 3.5 - 5.1 mmol/L 3.9 4.7 3.9  Chloride 98 - 111 mmol/L 103 105 104  CO2 22 - 32 mmol/L 27 23 29   Calcium 8.9 - 10.3 mg/dL 9.6 9.7 9.5  Total Protein 6.5 - 8.1 g/dL 6.6 7.0 7.4  Total Bilirubin 0.3 - 1.2 mg/dL 0.6 0.4 0.4  Alkaline Phos 38 - 126 U/L 70 59 65  AST 15 - 41 U/L 25 28 19   ALT 0 - 44 U/L 16 15 8     Lab Results  Component Value Date   INR 0.99 06/16/2017   INR 1.0 06/02/2017   INR 0.94 12/24/2016    Imaging: No results found.   A/P: Will plan excisional biopsy.  Discussed high risk of wound healing problems given the location and size, risk of bleeding, infection, and, scarring.  Questions were answered.     Romana Juniper, MD [facility] Surgery, [place] Pager [phone]

## 2018-07-16 NOTE — H&P (View-Only) (Signed)
Surgical H&P  CC: right forearm lesion  HPI: this is a very nice 77 year old man, known to me following laparoscopic bilateral inguinal hernia repair a year ago, who is referred for biopsy of a right forearm mass.  He noticed it about 3 months ago after his large puppy dog jumped up and scratched him on the arm.  He states prior to this there was no lesion whatsoever in the area however he has had multiple other scratches on his arms that have all healed but this was developed into a hard nodule that has increased in size and failed to heal despite attempted I&D and oral antibiotics.  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  Allergies  Allergen Reactions  . No Known Allergies     Past Medical History:  Diagnosis Date  . Anemia   . Arthritis   . 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 Diabetes mellitus without complication ([place])   . GERD (gastroesophageal reflux disease)   . Gout   . History of loop recorder   . Hypertension   . Left leg weakness   . Paroxysmal SVT (supraventricular tachycardia) ([place])    a. s/p RFCA on 05/01/15  . Stroke [facility])    TIA and mild stroke    Past Surgical History:  Procedure Laterality Date  . ATRIAL FIBRILLATION ABLATION     Dr. Lovena Le  . BASAL CELL CARCINOMA EXCISION     off of back  . ELECTROPHYSIOLOGIC STUDY N/A 05/01/2015   Procedure: SVT Ablation;  Surgeon: Evans Lance, MD;  Location: [place] [facility];  Service: Cardiovascular;  Laterality: N/A;  . EP IMPLANTABLE DEVICE N/A 06/04/2016   Procedure: Loop Recorder Insertion;  Surgeon: Thompson Grayer, MD;  Location: [place] [facility];  Service: Cardiovascular;  Laterality: N/A;  . INGUINAL HERNIA REPAIR Bilateral 06/16/2017   Procedure: LAPAROSCOPIC BILATERAL INGUINAL HERNIA REPAIR;  Surgeon: Clovis Riley, MD;  Location: [place];  Service: General;  Laterality: Bilateral;  . INSERTION OF MESH  Bilateral 06/16/2017   Procedure: INSERTION OF MESH;  Surgeon: Clovis Riley, MD;  Location: [place];  Service: General;  Laterality: Bilateral;  . SQUAMOUS CELL CARCINOMA EXCISION    . TEE WITHOUT CARDIOVERSION N/A 06/04/2016   Procedure: TRANSESOPHAGEAL ECHOCARDIOGRAM (TEE);  Surgeon: Pixie Casino, MD;  Location: [facility] ENDOSCOPY;  Service: Cardiovascular;  Laterality: N/A;    Family History  Problem Relation Age of Onset  . Stroke Mother   . Hypertension Mother   . Alzheimer's disease Father   . Heart failure Father   . Down syndrome Daughter     Social History   Socioeconomic History  . Marital status: Married    Spouse name: Not on file  . Number of children: Not on file  . Years of education: Not on file  . Highest education level: Not on file  Occupational History  . Not on file  Social Needs  . Financial resource strain: Not on file  . Food insecurity:    Worry: Not on file    Inability: Not on file  . Transportation needs:    Medical: Not on file    Non-medical: Not on file  Tobacco Use  . Smoking status: Never Smoker  . Smokeless tobacco: Never Used  Substance and Sexual Activity  . Alcohol use: No  . Drug use: No  . Sexual activity:  Not on file  Lifestyle  . Physical activity:    Days per week: Not on file    Minutes per session: Not on file  . Stress: Not on file  Relationships  . Social connections:    Talks on phone: Not on file    Gets together: Not on file    Attends religious service: Not on file    Active member of club or organization: Not on file    Attends meetings of clubs or organizations: Not on file    Relationship status: Not on file  Other Topics Concern  . Not on file  Social History Narrative  . Not on file    Current Outpatient Medications on File Prior to Visit  Medication Sig Dispense Refill  . acyclovir (ZOVIRAX) 400 MG tablet Take 1 tablet (400 mg total) by mouth daily. 90 tablet 0  . allopurinol (ZYLOPRIM) 300 MG tablet  Take 300 mg by mouth daily.     [name] Kitchen atorvastatin (LIPITOR) 40 MG tablet Take 1 tablet (40 mg total) by mouth every morning. 90 tablet 3  . cyanocobalamin 1000 MCG tablet Take 1,000 mcg by mouth daily.    [name] Kitchen dexamethasone (DECADRON) 4 MG tablet Take 5 tablets (23m) by mouth once weekly. Take on days 1, 8, 15, and 21 of every 28day cycle 60 tablet 0  . fluorouracil (EFUDEX) 5 % cream Apply 1 application topically daily as needed (for skin cancer prevention).   0  . glucose monitoring kit (FREESTYLE) monitoring kit 1 each by Does not apply route as needed for other.    . HYDROcodone-acetaminophen (NORCO/VICODIN) 5-325 MG tablet hydrocodone 5 mg-acetaminophen 325 mg tablet    . lenalidomide (REVLIMID) 15 MG capsule Take 1 capsule (15 mg total) by mouth daily. Take for 21 days on, 7 days off, repeat every 28 days. Authorization ##[id number] 21 capsule 0  . metFORMIN (GLUCOPHAGE) 1000 MG tablet Take 1,000 mg by mouth daily with breakfast.    . NINLARO 4 MG capsule TAKE 1 CAPSULE (4MG) BY MOUTH ONCE WEEKLY ON DAYS 1, 8, AND 15, OF EACH 28 DAY CYCLE. TAKE ON AN EMPTY STOMACH 1HR BEFORE OR 2HRS AFTER FOOD 3 capsule 2  . nitroGLYCERIN (NITROSTAT) 0.4 MG SL tablet Place 1 tablet (0.4 mg total) under the tongue every 5 (five) minutes as needed for chest pain. 25 tablet 3  . omeprazole (PRILOSEC) 20 MG capsule Take 20 mg by mouth every morning.     . rivaroxaban (XARELTO) 20 MG TABS tablet Take 1 tablet (20 mg total) by mouth daily with supper. 90 tablet 2  . telmisartan-hydrochlorothiazide (MICARDIS HCT) 80-12.5 MG tablet Take 1 tablet by mouth daily. 90 tablet 3  . traMADol (ULTRAM) 50 MG tablet Take 1-2 tablets (50-100 mg total) by mouth every 6 (six) hours as needed for moderate pain or severe pain. 50 tablet 0   No current facility-administered medications on file prior to visit.     Review of Systems: a complete, 10pt review of systems was completed with pertinent positives and negatives as documented  in the HPI  Physical Exam: There were no vitals filed for this visit.  Gen: alert and well appearing Eye: extraocular motion intact, no scleral icterus ENT: moist mucus membranes, dentition intact Neck: no mass or thyromegaly Chest: unlabored respirations, symmetrical air entry, clear bilaterally CV: regular rate and rhythm, no pedal edema Abdomen: soft, nontender, nondistended. No mass or organomegaly MSK: strength symmetrical throughout, no deformity Neuro: grossly intact, normal gait  Psych: normal mood and affect, appropriate insight Skin: warm and dry, 2 cm elevated soft nodule with overlying eschar and 5 mm border of reactive erythema, mildly tender, no fluctuance or drainage.   CBC Latest Ref Rng & Units 06/24/2018 06/17/2018 05/25/2018  WBC 4.0 - 10.3 K/uL 3.0(L) 2.9(L) 3.9(L)  Hemoglobin 13.0 - 17.1 g/dL 11.7(L) 11.3(L) 12.0(L)  Hematocrit 38.4 - 49.9 % 35.9(L) 34.5(L) 37.2(L)  Platelets 140 - 400 K/uL 123(L) 56(L) 169    CMP Latest Ref Rng & Units 06/17/2018 05/25/2018 04/24/2018  Glucose 70 - 99 mg/dL 107(H) 95 124  BUN 8 - 23 mg/dL 31(H) 32(H) 23  Creatinine 0.61 - 1.24 mg/dL 1.20 1.49(H) 1.08  Sodium 135 - 145 mmol/L 140 138 139  Potassium 3.5 - 5.1 mmol/L 3.9 4.7 3.9  Chloride 98 - 111 mmol/L 103 105 104  CO2 22 - 32 mmol/L 27 23 29   Calcium 8.9 - 10.3 mg/dL 9.6 9.7 9.5  Total Protein 6.5 - 8.1 g/dL 6.6 7.0 7.4  Total Bilirubin 0.3 - 1.2 mg/dL 0.6 0.4 0.4  Alkaline Phos 38 - 126 U/L 70 59 65  AST 15 - 41 U/L 25 28 19   ALT 0 - 44 U/L 16 15 8     Lab Results  Component Value Date   INR 0.99 06/16/2017   INR 1.0 06/02/2017   INR 0.94 12/24/2016    Imaging: No results found.   A/P: Will plan excisional biopsy.  Discussed high risk of wound healing problems given the location and size, risk of bleeding, infection, and, scarring.  Questions were answered.     Romana Juniper, MD [facility] Surgery, [place] Pager [phone]

## 2018-07-20 NOTE — Progress Notes (Signed)
[name] Kitchen    HEMATOLOGY/ONCOLOGY CLINIC NOTE  Date of Service: 07/21/2018    Patient Care Team: Mayra Neer, MD as PCP - General (Family Medicine)  CHIEF COMPLAINTS/:   F/u for continued mx of Myeloma  HISTORY OF PRESENTING ILLNESS:   Justin [name] is a wonderful 77 y.o. 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  Patient has a history of hypertension, diabetes, GERD, gout, squamous cell skin cancers, elevated PSA, SVT status post ablation in May 2016, chronic back pain who has a history of recurrent CVA/TIA's. He apparently had a left hemispheric TIA June 2017 and has had recurrent) subcortical infarcts thought to be related to small vessel disease. He was noted to have a small PFO that was of questionable significance. He was initially on aspirin and then continued and aspirin + plavix for secondary stroke prevention. He follows with Dr. Antony Contras for his neurology cares.  An SPEP was done due to elevated total proteins of 8.9 and showed an M spike of 2 g/dL. He was also noted to have an elevated total protein of 8.6 in April 2017. He was referred to [place] for further evaluation of his M spike. Blood test did not reveal any hypercalcemia, no significant renal failure. He has been noted to have developed anemia since June the serum and his hemoglobin was 12.6 and is now down to the mid 10 range. He notes no focal bone pains at this time. Overt acute weight loss. No fevers no chills no night sweats.  CURRENT THERAPY:    Ixazomib+ Revlimid + Dexamethasone  INTERVAL HISTORY   Justin [name] presents to the office today for follow-up of his myeloma. The patient's last visit with [place] was on 06/23/18. The pt reports that he is doing well overall.   The pt reports that his right forearm mass is scheduled to be surgically excised on 07/28/18 with Dr. Romana Juniper. The pt has held his Revlimid, and has continued Ninlaro. He notes that his  right forearm mass has increased in size and there is a hole developing in the center of it.   Lab results today (07/21/18) of CBC w/diff, CMP is as follows: all values are WNL except for WBC at 3.9k, RBC at 3.57, HGB at 11.3, HCT at 34.6, RDW at 16.2, Lymphs abs at 700. MMP 07/21/18 is pending Kappa/Lambda 07/21/18 is pending  On review of systems, pt reports good energy levels, growing right forearm mass, and denies fevers, chills, night sweats, mouth sores, new tingling or numbness in his hands or feet, and any other symptoms.    MEDICAL HISTORY:  Past Medical History:  Diagnosis Date  . Anemia   . Arthritis   . 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 Diabetes mellitus without complication ([place])   . GERD (gastroesophageal reflux disease)   . Gout   . History of loop recorder   . Hypertension   . Left leg weakness   . Paroxysmal SVT (supraventricular tachycardia) ([place])    a. s/p RFCA on 05/01/15  . Stroke [facility])    TIA and mild stroke    SURGICAL HISTORY: Past Surgical History:  Procedure Laterality Date  . ATRIAL FIBRILLATION ABLATION     Dr. Lovena Le  . BASAL CELL CARCINOMA EXCISION     off of back  . ELECTROPHYSIOLOGIC STUDY N/A 05/01/2015   Procedure: SVT Ablation;  Surgeon: Champ Mungo  Lovena Le, MD;  Location: [place] CV LAB;  Service: Cardiovascular;  Laterality: N/A;  . EP IMPLANTABLE DEVICE N/A 06/04/2016   Procedure: Loop Recorder Insertion;  Surgeon: Thompson Grayer, MD;  Location: [place] [facility];  Service: Cardiovascular;  Laterality: N/A;  . INGUINAL HERNIA REPAIR Bilateral 06/16/2017   Procedure: LAPAROSCOPIC BILATERAL INGUINAL HERNIA REPAIR;  Surgeon: Clovis Riley, MD;  Location: [place];  Service: General;  Laterality: Bilateral;  . INSERTION OF MESH Bilateral 06/16/2017   Procedure: INSERTION OF MESH;  Surgeon: Clovis Riley, MD;  Location: [place];  Service: General;  Laterality: Bilateral;  . SQUAMOUS CELL CARCINOMA EXCISION    .  TEE WITHOUT CARDIOVERSION N/A 06/04/2016   Procedure: TRANSESOPHAGEAL ECHOCARDIOGRAM (TEE);  Surgeon: Pixie Casino, MD;  Location[facility] ENDOSCOPY;  Service: Cardiovascular;  Laterality: N/A;    SOCIAL HISTORY: Social History   Socioeconomic History  . Marital status: Married    Spouse name: Not on file  . Number of children: Not on file  . Years of education: Not on file  . Highest education level: Not on file  Occupational History  . Not on file  Social Needs  . Financial resource strain: Not on file  . Food insecurity:    Worry: Not on file    Inability: Not on file  . Transportation needs:    Medical: Not on file    Non-medical: Not on file  Tobacco Use  . Smoking status: Never Smoker  . Smokeless tobacco: Never Used  Substance and Sexual Activity  . Alcohol use: No  . Drug use: No  . Sexual activity: Not on file  Lifestyle  . Physical activity:    Days per week: Not on file    Minutes per session: Not on file  . Stress: Not on file  Relationships  . Social connections:    Talks on phone: Not on file    Gets together: Not on file    Attends religious service: Not on file    Active member of club or organization: Not on file    Attends meetings of clubs or organizations: Not on file    Relationship status: Not on file  . Intimate partner violence:    Fear of current or ex partner: Not on file    Emotionally abused: Not on file    Physically abused: Not on file    Forced sexual activity: Not on file  Other Topics Concern  . Not on file  Social History Narrative  . Not on file    FAMILY HISTORY: Family History  Problem Relation Age of Onset  . Stroke Mother   . Hypertension Mother   . Alzheimer's disease Father   . Heart failure Father   . Down syndrome Daughter     ALLERGIES:  is allergic to no known allergies.  MEDICATIONS:  Current Outpatient Medications  Medication Sig Dispense Refill  . acyclovir (ZOVIRAX) 400 MG tablet Take 1 tablet (400 mg  total) by mouth daily. 90 tablet 0  . allopurinol (ZYLOPRIM) 300 MG tablet Take 300 mg by mouth daily.     [name] Kitchen atorvastatin (LIPITOR) 40 MG tablet Take 1 tablet (40 mg total) by mouth every morning. 90 tablet 3  . cyanocobalamin 1000 MCG tablet Take 1,000 mcg by mouth daily.    [name] Kitchen dexamethasone (DECADRON) 4 MG tablet Take 5 tablets (44m) by mouth once weekly. Take on days 1, 8, 15, and 21 of every 28day cycle 60 tablet 0  .  fluorouracil (EFUDEX) 5 % cream Apply 1 application topically daily as needed (for skin cancer prevention).   0  . glucose monitoring kit (FREESTYLE) monitoring kit 1 each by Does not apply route as needed for other.    . HYDROcodone-acetaminophen (NORCO/VICODIN) 5-325 MG tablet hydrocodone 5 mg-acetaminophen 325 mg tablet    . lenalidomide (REVLIMID) 15 MG capsule Take 1 capsule (15 mg total) by mouth daily. Take for 21 days on, 7 days off, repeat every 28 days. Authorization #[id number] 06/18/18 21 capsule 0  . metFORMIN (GLUCOPHAGE) 1000 MG tablet Take 1,000 mg by mouth daily with breakfast.    . NINLARO 4 MG capsule TAKE 1 CAPSULE (4MG) BY MOUTH ONCE WEEKLY ON DAYS 1, 8, AND 15, OF EACH 28 DAY CYCLE. TAKE ON AN EMPTY STOMACH 1HR BEFORE OR 2HRS AFTER FOOD 3 capsule 2  . nitroGLYCERIN (NITROSTAT) 0.4 MG SL tablet Place 1 tablet (0.4 mg total) under the tongue every 5 (five) minutes as needed for chest pain. 25 tablet 3  . omeprazole (PRILOSEC) 20 MG capsule Take 20 mg by mouth every morning.     . rivaroxaban (XARELTO) 20 MG TABS tablet Take 1 tablet (20 mg total) by mouth daily with supper. 90 tablet 2  . telmisartan-hydrochlorothiazide (MICARDIS HCT) 80-12.5 MG tablet Take 1 tablet by mouth daily. 90 tablet 3  . traMADol (ULTRAM) 50 MG tablet Take 1-2 tablets (50-100 mg total) by mouth every 6 (six) hours as needed for moderate pain or severe pain. 50 tablet 0   No current facility-administered medications for this visit.     REVIEW OF SYSTEMS:    A 10+ POINT REVIEW OF SYSTEMS  WAS OBTAINED including neurology, dermatology, psychiatry, cardiac, respiratory, lymph, extremities, GI, GU, Musculoskeletal, constitutional, breasts, reproductive, HEENT.  All pertinent positives are noted in the HPI.  All others are negative.   PHYSICAL EXAMINATION:   ECOG PERFORMANCE STATUS: 2 - Symptomatic, <50% confined to bed  Vitals:   07/21/18 1515  BP: 117/71  Pulse: 68  Resp: 18  Temp: 98.1 F (36.7 C)  SpO2: 99%   Filed Weights   07/21/18 1515  Weight: 172 lb 11.2 oz (78.3 kg)   Body mass index is 26.26 kg/m.  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 No palpable hepatosplenomegaly.  Extremity: no pedal edema PSYCH: alert & oriented x 3 with fluent speech NEURO: no focal motor/sensory deficits    LABORATORY DATA:  I have reviewed the data as listed. CBC Latest Ref Rng & Units 07/21/2018 06/24/2018 06/17/2018  WBC 4.0 - 10.3 K/uL 3.9(L) 3.0(L) 2.9(L)  Hemoglobin 13.0 - 17.1 g/dL 11.3(L) 11.7(L) 11.3(L)  Hematocrit 38.4 - 49.9 % 34.6(L) 35.9(L) 34.5(L)  Platelets 140 - 400 K/uL 149 123(L) 56(L)  ANC 1.8k  CMP Latest Ref Rng & Units 07/21/2018 06/17/2018 05/25/2018  Glucose 70 - 99 mg/dL 94 107(H) 95  BUN 8 - 23 mg/dL 21 31(H) 32(H)  Creatinine 0.61 - 1.24 mg/dL 1.00 1.20 1.49(H)  Sodium 135 - 145 mmol/L 141 140 138  Potassium 3.5 - 5.1 mmol/L 4.0 3.9  4.7  Chloride 98 - 111 mmol/L 105 103 105  CO2 22 - 32 mmol/L _0 Calcium 8.9 - 10.3 mg/dL 9.6  9.6 9.7  Total Protein 6.5 - 8.1 g/dL 6.6 6.6 7.0  Total Bilirubin 0.3 - 1.2 mg/dL 0.5 0.6 0.4  Alkaline Phos 38 - 126 U/L 66 70 59  AST 15 - 41 U/L _1 ALT 0 - 44 U/L _2 Lab Results  Component Value Date   IRON 45 06/02/2017   TIBC 233 06/02/2017   IRONPCTSAT 19 (L) 06/02/2017   (Iron and TIBC)  Lab Results  Component Value Date   FERRITIN 308 08/26/2017         RADIOGRAPHIC STUDIES: I have personally reviewed the radiological images as listed and agreed with the findings in the report.  Bone Marrow Biopsy 12/24/2016 (Accession [id number])  Diagnosis Bone Marrow, Aspirate,Biopsy, and Clot, left iliac crest - HYPERCELLULAR BONE MARROW FOR AGE WITH PLASMA CELL NEOPLASM. - SEE COMMENT. PERIPHERAL BLOOD: - NORMOCYTIC-NORMOCHROMIC ANEMIA. - LEUKOPENIA.  DG Bone Survey Met (Accession [id number]) (Order [id number])  Imaging  Date: 11/28/2016 Department: [facility] Released By: Pricilla Riffle Authorizing: Brunetta Genera, MD  Exam Information   Status Exam Begun  Exam Ended   Final [99] 11/28/2016 11:59 AM 11/28/2016 12:36 PM  PACS Images   Show images for DG Bone Survey Met  Study Result   CLINICAL DATA:  Monoclonal gammopathy of unknown significance. History of squamous cell carcinoma excision, basal cell carcinoma excision.  EXAM: METASTATIC BONE SURVEY  COMPARISON:  CT neck, chest, abdomen and pelvis from 10/29/16, MRI of the head from 06/01/2016, CXR 10/27/2016, lumbar spine radiographs 06/24/2016  FINDINGS: Lateral skull: Small occipital lucency which may represent a normal arachnoid granulation posteriorly in the occiput. No definite lytic abnormality.  Cervical spine AP and lateral: Disc space narrowing at C2-3, and from C4 through C7. C4-5, C5-6 and C6-7 uncovertebral joint spurring bilaterally. No lytic abnormality.  Thoracic spine AP and lateral: T8-9 right-sided osteophytes and to  a lesser degree T9-10. No lytic abnormality. Slight multilevel lumbar thoracic disc space narrowing likely degenerative.  Lumbar spine AP and lateral: Disc space narrowing at L4-5 and to a greater extent L5-S1. No lytic disease. L3 through S1 facet sclerosis.  AP pelvis: Negative for lytic disease.  Bilateral upper and lower extremities shoulders through wrist and from the hips through ankle: Negative for lytic disease. Joint space narrowing of the femorotibial compartments both knees. Subchondral cyst of the right patella.  CXR: Clear lungs. Cardiac implantable monitoring device projects over the left heart. Aortic atherosclerosis. No lytic disease.  IMPRESSION: No findings suspicious for lytic disease.   Electronically Signed   By: Ashley Royalty M.D.   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 (12/2016) Peak M spike 2.6  CYtogenetics and Myeloma FISH panel.- trisomy 11 Patient has normocytic anemia without any other clear etiology. (>2g/dl lower that lower limit of normal which is 13) which per criteria would place this in the Multiple myeloma category as opposed to Smoldering Multiple myeloma (Borderline criterion)  No overt renal failure or hypercalcemia at this time No focal bone pains though he has significant chronic back pain related to degenerative disc disease. Bone survey shows no overt lytic lesions.  2)  anemia and thrombocytopenia -- related to treatment (Ixazomib + Revlimid) - improved  3) Occasional Rash of extremities and neuropathy, Mild diarrhea- secondary to treatment   PLAN  -Discussed pt labwork today, 07/21/18; HGB stable at 11.3, PLT increased to 149k -Begin Revlimid after surgical excision of lesion on right forearm with Dr. Romana Juniper on 07/28/18 when surgeon clears pt for returning to [place] for surgery, return to Xarelto at surgeon's clearance -Will see pt back in 6 weeks -The pt has no prohibitive toxicities from continuing Ninlaro at this time.   -I encouraged him to drink more water to stay hydrated, 64 ounces a day of non-alcohol, non-caffeine and non-carbonated liquids.    4) Borderline low B12 levels 299 --- on replacement - 04/2018 B12 level now improved to 724 -continue replacement.  5) recurrent CVA - thought to be related to small vessel disease as per neurology. Has a small PFO which could be an additional risk factor as well as his afib  6)  P afib Plan -Continue on Xarelto per cardiology.  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 Romana Juniper on 07/28/18 when surgeon clears pt for returning to [place] two days prior to surgery, return to Xarelto at surgeon's clearance  8) Patient Active Problem List   Diagnosis Date Noted  . History of loop recorder 06/12/2017  . Cryptogenic stroke ([place]) 10/28/2016  . Gait abnormality   . History of CVA (cerebrovascular accident)   . History of TIA (transient ischemic attack)   . Benign essential HTN   . Paroxysmal SVT (supraventricular tachycardia) ([place])   . Acute blood loss anemia   . Acute ischemic stroke ([place])   . CVA (cerebral vascular accident) ([place]) 10/26/2016  . History of recent stroke 06/06/2016  . 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 TIA (transient ischemic attack) 06/02/2016  . Numbness 06/01/2016  . Right arm numbness 06/01/2016  . Atherosclerosis of native coronary artery of native heart without angina pectoris 03/20/2016  . Hypertension   . GERD (gastroesophageal reflux disease)   . Gout   . S/P RF ablation operation for arrhythmia 12/13/2014  . Hyperlipidemia 12/13/2014  .  Controlled type 2 diabetes mellitus with complication, without long-term current use of insulin ([place]) 12/07/2014   PLAN -Continue f/u with PCP (blood sugars)  8) Lower extremity discomfort - improved, stable. Likely Discogenic pain -[place] on 1/19 revealed no blood clot -Pt has established care with orthopedist and PT.   RTC with Dr Irene Limbo in 6 weeks with labs   All of the patients questions were answered with apparent satisfaction. The patient knows to call the clinic with any problems, questions or concerns.  The total time spent in the appt was 20 minutes and more than 50% was on counseling and direct patient cares.      Sullivan Lone MD MS AAHIVMS [facility] Hematology/Oncology Physician [facility]  (Office):       [phone] (Work cell):  [phone] (Fax):           [phone]  I, Baldwin [place], am acting as a scribe for Dr. Irene Limbo  .I have reviewed the above documentation for accuracy and completeness, and I agree with the above. Brunetta Genera MD

## 2018-07-21 ENCOUNTER — Telehealth: Payer: Self-pay | Admitting: Hematology

## 2018-07-21 ENCOUNTER — Inpatient Hospital Stay: Payer: Medicare Other

## 2018-07-21 ENCOUNTER — Inpatient Hospital Stay (HOSPITAL_BASED_OUTPATIENT_CLINIC_OR_DEPARTMENT_OTHER): Payer: Medicare Other | Admitting: Hematology

## 2018-07-21 VITALS — BP 117/71 | HR 68 | Temp 98.1°F | Resp 18 | Ht 68.0 in | Wt 172.7 lb

## 2018-07-21 DIAGNOSIS — R2231 Localized swelling, mass and lump, right upper limb: Secondary | ICD-10-CM | POA: Diagnosis not present

## 2018-07-21 DIAGNOSIS — D6959 Other secondary thrombocytopenia: Secondary | ICD-10-CM

## 2018-07-21 DIAGNOSIS — I480 Paroxysmal atrial fibrillation: Secondary | ICD-10-CM

## 2018-07-21 DIAGNOSIS — I10 Essential (primary) hypertension: Secondary | ICD-10-CM | POA: Diagnosis not present

## 2018-07-21 DIAGNOSIS — L989 Disorder of the skin and subcutaneous tissue, unspecified: Secondary | ICD-10-CM

## 2018-07-21 DIAGNOSIS — R21 Rash and other nonspecific skin eruption: Secondary | ICD-10-CM | POA: Diagnosis not present

## 2018-07-21 DIAGNOSIS — C9000 Multiple myeloma not having achieved remission: Secondary | ICD-10-CM | POA: Diagnosis not present

## 2018-07-21 DIAGNOSIS — R197 Diarrhea, unspecified: Secondary | ICD-10-CM

## 2018-07-21 DIAGNOSIS — G629 Polyneuropathy, unspecified: Secondary | ICD-10-CM

## 2018-07-21 DIAGNOSIS — Z7901 Long term (current) use of anticoagulants: Secondary | ICD-10-CM

## 2018-07-21 DIAGNOSIS — Z8673 Personal history of transient ischemic attack (TIA), and cerebral infarction without residual deficits: Secondary | ICD-10-CM

## 2018-07-21 DIAGNOSIS — D6481 Anemia due to antineoplastic chemotherapy: Secondary | ICD-10-CM | POA: Diagnosis not present

## 2018-07-21 DIAGNOSIS — T451X5A Adverse effect of antineoplastic and immunosuppressive drugs, initial encounter: Secondary | ICD-10-CM

## 2018-07-21 LAB — CBC WITH DIFFERENTIAL/PLATELET
Basophils Absolute: 0 10*3/uL (ref 0.0–0.1)
Basophils Relative: 1 %
EOS PCT: 5 %
Eosinophils Absolute: 0.2 10*3/uL (ref 0.0–0.5)
HCT: 34.6 % — ABNORMAL LOW (ref 38.4–49.9)
Hemoglobin: 11.3 g/dL — ABNORMAL LOW (ref 13.0–17.1)
LYMPHS PCT: 17 %
Lymphs Abs: 0.7 10*3/uL — ABNORMAL LOW (ref 0.9–3.3)
MCH: 31.7 pg (ref 27.2–33.4)
MCHC: 32.7 g/dL (ref 32.0–36.0)
MCV: 96.9 fL (ref 79.3–98.0)
MONO ABS: 0.3 10*3/uL (ref 0.1–0.9)
MONOS PCT: 8 %
Neutro Abs: 2.7 10*3/uL (ref 1.5–6.5)
Neutrophils Relative %: 69 %
PLATELETS: 149 10*3/uL (ref 140–400)
RBC: 3.57 MIL/uL — ABNORMAL LOW (ref 4.20–5.82)
RDW: 16.2 % — AB (ref 11.0–14.6)
WBC: 3.9 10*3/uL — ABNORMAL LOW (ref 4.0–10.3)

## 2018-07-21 LAB — CMP (CANCER CENTER ONLY)
ALT: 9 U/L (ref 0–44)
AST: 18 U/L (ref 15–41)
Albumin: 3.8 g/dL (ref 3.5–5.0)
Alkaline Phosphatase: 66 U/L (ref 38–126)
Anion gap: 6 (ref 5–15)
BUN: 21 mg/dL (ref 8–23)
CHLORIDE: 105 mmol/L (ref 98–111)
CO2: 30 mmol/L (ref 22–32)
CREATININE: 1 mg/dL (ref 0.61–1.24)
Calcium: 9.6 mg/dL (ref 8.9–10.3)
Glucose, Bld: 94 mg/dL (ref 70–99)
POTASSIUM: 4 mmol/L (ref 3.5–5.1)
Sodium: 141 mmol/L (ref 135–145)
Total Bilirubin: 0.5 mg/dL (ref 0.3–1.2)
Total Protein: 6.6 g/dL (ref 6.5–8.1)

## 2018-07-21 NOTE — Telephone Encounter (Signed)
Gave pt avs and calendar with appts per 7/30 los °

## 2018-07-22 LAB — KAPPA/LAMBDA LIGHT CHAINS
KAPPA, LAMDA LIGHT CHAIN RATIO: 0.46 (ref 0.26–1.65)
Kappa free light chain: 12.1 mg/L (ref 3.3–19.4)
LAMDA FREE LIGHT CHAINS: 26.2 mg/L (ref 5.7–26.3)

## 2018-07-22 NOTE — Patient Instructions (Signed)
Justin [name]  07/22/2018   Your procedure is scheduled on: 07-28-18   Report to [facility] Main  Entrance    Report to admitting at 9:00AM    Call this number if you have problems the morning of surgery [phone]     Remember: Do not eat food or drink liquids :After Midnight.     Take these medicines the morning of surgery with A SIP OF WATER: allopurinol, atorvastatin, omeprazole                                 You may not have any metal on your body including hair pins and              piercings  Do not wear jewelry, make-up, lotions, powders or perfumes, deodorant              Men may shave face and neck.   Do not bring valuables to the hospital. [place].  Contacts, dentures or bridgework may not be worn into surgery.      Patients discharged the day of surgery will not be allowed to drive home.  Name and phone number of your driver:  Special Instructions: N/A              Please read over the following fact sheets you were given: _____________________________________________________________________             How to Manage Your Diabetes Before and After Surgery  Why is it important to control my blood sugar before and after surgery? . Improving blood sugar levels before and after surgery helps healing and can limit problems. . A way of improving blood sugar control is eating a healthy diet by: o  Eating less sugar and carbohydrates o  Increasing activity/exercise o  Talking with your doctor about reaching your blood sugar goals . High blood sugars (greater than 180 mg/dL) can raise your risk of infections and slow your recovery, so you will need to focus on controlling your diabetes during the weeks before surgery. . Make sure that the doctor who takes care of your diabetes knows about your planned surgery including the date and location.  How do I manage my blood sugar before  surgery? . Check your blood sugar at least 4 times a day, starting 2 days before surgery, to make sure that the level is not too high or low. o Check your blood sugar the morning of your surgery when you wake up and every 2 hours until you get to the Short Stay unit. . If your blood sugar is less than 70 mg/dL, you will need to treat for low blood sugar: o Do not take insulin. o Treat a low blood sugar (less than 70 mg/dL) with  cup of clear juice (cranberry or apple), 4 glucose tablets, OR glucose gel. o Recheck blood sugar in 15 minutes after treatment (to make sure it is greater than 70 mg/dL). If your blood sugar is not greater than 70 mg/dL on recheck, call [phone] for further instructions. . Report your blood sugar to the short stay nurse when you get to Short Stay.  . If you are admitted to the hospital after surgery: o Your blood sugar will be  checked by the staff and you will probably be given insulin after surgery (instead of oral diabetes medicines) to make sure you have good blood sugar levels. o The goal for blood sugar control after surgery is 80-180 mg/dL.   WHAT DO I DO ABOUT MY DIABETES MEDICATION?    . THE MORNING OF SURGERY, Do not take oral diabetes medicines (pills) !   Patient Signature:  Date:   Nurse Signature:  Date:   Reviewed and Endorsed by [facility] Patient Education Committee, August 2015    [facility] - Preparing for Surgery Before surgery, you can play an important role.  Because skin is not sterile, your skin needs to be as free of germs as possible.  You can reduce the number of germs on your skin by washing with CHG (chlorahexidine gluconate) soap before surgery.  CHG is an antiseptic cleaner which kills germs and bonds with the skin to continue killing germs even after washing. Please DO NOT use if you have an allergy to CHG or antibacterial soaps.  If your skin becomes reddened/irritated stop using the CHG and inform your nurse when you  arrive at Short Stay. Do not shave (including legs and underarms) for at least 48 hours prior to the first CHG shower.  You may shave your face/neck. Please follow these instructions carefully:  1.  Shower with CHG Soap the night before surgery and the  morning of Surgery.  2.  If you choose to wash your hair, wash your hair first as usual with your  normal  shampoo.  3.  After you shampoo, rinse your hair and body thoroughly to remove the  shampoo.                           4.  Use CHG as you would any other liquid soap.  You can apply chg directly  to the skin and wash                       Gently with a scrungie or clean washcloth.  5.  Apply the CHG Soap to your body ONLY FROM THE NECK DOWN.   Do not use on face/ open                           Wound or open sores. Avoid contact with eyes, ears mouth and genitals (private parts).                       Wash face,  Genitals (private parts) with your normal soap.             6.  Wash thoroughly, paying special attention to the area where your surgery  will be performed.  7.  Thoroughly rinse your body with warm water from the neck down.  8.  DO NOT shower/wash with your normal soap after using and rinsing off  the CHG Soap.                9.  Pat yourself dry with a clean towel.            10.  Wear clean pajamas.            11.  Place clean sheets on your bed the night of your first shower and do not  sleep with pets. Day of Surgery : Do not apply any  lotions/deodorants the morning of surgery.  Please wear clean clothes to the hospital/surgery center.  FAILURE TO FOLLOW THESE INSTRUCTIONS MAY RESULT IN THE CANCELLATION OF YOUR SURGERY PATIENT SIGNATURE_________________________________  NURSE SIGNATURE__________________________________  ________________________________________________________________________

## 2018-07-22 NOTE — Progress Notes (Signed)
LOV note Dr. Irene Limbo oncology 07-21-18  Epic   [facility] cardiology  [place] PA-C 05-25-18 epic   [place] 10-28-16 epic   Last in office loop recorder check 03-04-18

## 2018-07-23 ENCOUNTER — Encounter (HOSPITAL_COMMUNITY)
Admission: RE | Admit: 2018-07-23 | Discharge: 2018-07-23 | Disposition: A | Discharge status: Home / Self Care | Payer: Medicare Other | Source: Ambulatory Visit | Attending: Surgery | Admitting: Surgery

## 2018-07-23 ENCOUNTER — Other Ambulatory Visit: Payer: Self-pay

## 2018-07-23 ENCOUNTER — Encounter (HOSPITAL_COMMUNITY): Payer: Self-pay

## 2018-07-23 DIAGNOSIS — R2231 Localized swelling, mass and lump, right upper limb: Secondary | ICD-10-CM | POA: Insufficient documentation

## 2018-07-23 DIAGNOSIS — Z01812 Encounter for preprocedural laboratory examination: Secondary | ICD-10-CM | POA: Insufficient documentation

## 2018-07-23 DIAGNOSIS — Z01810 Encounter for preprocedural cardiovascular examination: Secondary | ICD-10-CM | POA: Diagnosis not present

## 2018-07-23 LAB — CBC
HCT: 36.1 % — ABNORMAL LOW (ref 39.0–52.0)
Hemoglobin: 11.9 g/dL — ABNORMAL LOW (ref 13.0–17.0)
MCH: 31.5 pg (ref 26.0–34.0)
MCHC: 33 g/dL (ref 30.0–36.0)
MCV: 95.5 fL (ref 78.0–100.0)
Platelets: 184 10*3/uL (ref 150–400)
RBC: 3.78 MIL/uL — ABNORMAL LOW (ref 4.22–5.81)
RDW: 15.9 % — AB (ref 11.5–15.5)
WBC: 3.2 10*3/uL — ABNORMAL LOW (ref 4.0–10.5)

## 2018-07-23 LAB — BASIC METABOLIC PANEL
Anion gap: 9 (ref 5–15)
BUN: 20 mg/dL (ref 8–23)
CALCIUM: 9.1 mg/dL (ref 8.9–10.3)
CO2: 30 mmol/L (ref 22–32)
CREATININE: 0.96 mg/dL (ref 0.61–1.24)
Chloride: 104 mmol/L (ref 98–111)
GFR calc Af Amer: >60 mL/min (ref >60)
GLUCOSE: 123 mg/dL — AB (ref 70–99)
Potassium: 3.9 mmol/L (ref 3.5–5.1)
Sodium: 143 mmol/L (ref 135–145)

## 2018-07-23 LAB — GLUCOSE, CAPILLARY: GLUCOSE-CAPILLARY: 101 mg/dL — AB (ref 70–99)

## 2018-07-23 LAB — MULTIPLE MYELOMA PANEL, SERUM
ALBUMIN/GLOB SERPL: 1.2 (ref 0.7–1.7)
ALPHA 1: 0.3 g/dL (ref 0.0–0.4)
ALPHA2 GLOB SERPL ELPH-MCNC: 0.9 g/dL (ref 0.4–1.0)
Albumin SerPl Elph-Mcnc: 3.4 g/dL (ref 2.9–4.4)
B-GLOBULIN SERPL ELPH-MCNC: 0.9 g/dL (ref 0.7–1.3)
Gamma Glob SerPl Elph-Mcnc: 0.9 g/dL (ref 0.4–1.8)
Globulin, Total: 2.9 g/dL (ref 2.2–3.9)
IGM (IMMUNOGLOBULIN M), SRM: 21 mg/dL (ref 15–143)
IgA: 34 mg/dL — ABNORMAL LOW (ref 61–437)
IgG (Immunoglobin G), Serum: 871 mg/dL (ref 700–1600)
M PROTEIN SERPL ELPH-MCNC: 0.6 g/dL — AB
TOTAL PROTEIN ELP: 6.3 g/dL (ref 6.0–8.5)

## 2018-07-23 LAB — HEMOGLOBIN A1C
Hgb A1c MFr Bld: 7.1 % — ABNORMAL HIGH (ref 4.8–5.6)
MEAN PLASMA GLUCOSE: 157.07 mg/dL

## 2018-07-24 ENCOUNTER — Other Ambulatory Visit: Payer: Self-pay | Admitting: Hematology

## 2018-07-24 DIAGNOSIS — C9000 Multiple myeloma not having achieved remission: Secondary | ICD-10-CM

## 2018-07-27 MED ORDER — BUPIVACAINE LIPOSOME 1.3 % IJ SUSP
20.0000 mL | Freq: Once | INTRAMUSCULAR | Status: DC
  Filled 2018-07-27: qty 20

## 2018-07-27 MED FILL — NINLARO 4 MG CAP: 4 | 28 days supply | Qty: 3 | Fill #1

## 2018-07-28 ENCOUNTER — Ambulatory Visit (HOSPITAL_COMMUNITY): Payer: Medicare Other | Admitting: Certified Registered Nurse Anesthetist

## 2018-07-28 ENCOUNTER — Encounter (HOSPITAL_COMMUNITY): Payer: Self-pay | Admitting: Certified Registered Nurse Anesthetist

## 2018-07-28 ENCOUNTER — Ambulatory Visit (HOSPITAL_COMMUNITY)
Admission: RE | Admit: 2018-07-28 | Discharge: 2018-07-28 | Disposition: A | Discharge status: Home / Self Care | Payer: Medicare Other | Source: Ambulatory Visit | Attending: Surgery | Admitting: Surgery

## 2018-07-28 ENCOUNTER — Encounter (HOSPITAL_COMMUNITY)
Admission: RE | Disposition: A | Discharge status: Home / Self Care | Payer: Self-pay | Source: Ambulatory Visit | Attending: Surgery

## 2018-07-28 DIAGNOSIS — Z85819 Personal history of malignant neoplasm of unspecified site of lip, oral cavity, and pharynx: Secondary | ICD-10-CM | POA: Insufficient documentation

## 2018-07-28 DIAGNOSIS — E119 Type 2 diabetes mellitus without complications: Secondary | ICD-10-CM | POA: Diagnosis not present

## 2018-07-28 DIAGNOSIS — Z85828 Personal history of other malignant neoplasm of skin: Secondary | ICD-10-CM | POA: Insufficient documentation

## 2018-07-28 DIAGNOSIS — M109 Gout, unspecified: Secondary | ICD-10-CM | POA: Diagnosis not present

## 2018-07-28 DIAGNOSIS — C44622 Squamous cell carcinoma of skin of right upper limb, including shoulder: Secondary | ICD-10-CM | POA: Insufficient documentation

## 2018-07-28 DIAGNOSIS — Z9221 Personal history of antineoplastic chemotherapy: Secondary | ICD-10-CM | POA: Insufficient documentation

## 2018-07-28 DIAGNOSIS — I10 Essential (primary) hypertension: Secondary | ICD-10-CM | POA: Diagnosis not present

## 2018-07-28 DIAGNOSIS — C9000 Multiple myeloma not having achieved remission: Secondary | ICD-10-CM | POA: Diagnosis not present

## 2018-07-28 DIAGNOSIS — I2510 Atherosclerotic heart disease of native coronary artery without angina pectoris: Secondary | ICD-10-CM | POA: Diagnosis not present

## 2018-07-28 DIAGNOSIS — K219 Gastro-esophageal reflux disease without esophagitis: Secondary | ICD-10-CM | POA: Insufficient documentation

## 2018-07-28 DIAGNOSIS — R2231 Localized swelling, mass and lump, right upper limb: Secondary | ICD-10-CM | POA: Diagnosis not present

## 2018-07-28 DIAGNOSIS — M1990 Unspecified osteoarthritis, unspecified site: Secondary | ICD-10-CM | POA: Diagnosis not present

## 2018-07-28 DIAGNOSIS — Z79899 Other long term (current) drug therapy: Secondary | ICD-10-CM | POA: Insufficient documentation

## 2018-07-28 DIAGNOSIS — I69354 Hemiplegia and hemiparesis following cerebral infarction affecting left non-dominant side: Secondary | ICD-10-CM | POA: Insufficient documentation

## 2018-07-28 DIAGNOSIS — Z7984 Long term (current) use of oral hypoglycemic drugs: Secondary | ICD-10-CM | POA: Insufficient documentation

## 2018-07-28 HISTORY — PX: MASS EXCISION: SHX2000

## 2018-07-28 LAB — GLUCOSE, CAPILLARY
GLUCOSE-CAPILLARY: 106 mg/dL — AB (ref 70–99)
Glucose-Capillary: 141 mg/dL — ABNORMAL HIGH (ref 70–99)

## 2018-07-28 SURGERY — EXCISION MASS
Anesthesia: Monitor Anesthesia Care | Laterality: Right

## 2018-07-28 MED ORDER — CHLORHEXIDINE GLUCONATE 4 % EX LIQD: 60.0000 mL | Freq: Once | CUTANEOUS | Status: DC

## 2018-07-28 MED ORDER — ONDANSETRON HCL 4 MG/2ML IJ SOLN
INTRAMUSCULAR | Status: DC | PRN
  Administered 2018-07-28: 4 mg via INTRAVENOUS

## 2018-07-28 MED ORDER — ACETAMINOPHEN 650 MG RE SUPP
650.0000 mg | RECTAL | Status: DC | PRN
  Filled 2018-07-28: qty 1

## 2018-07-28 MED ORDER — MIDAZOLAM HCL 2 MG/2ML IJ SOLN
INTRAMUSCULAR | Status: AC
  Filled 2018-07-28: qty 2

## 2018-07-28 MED ORDER — FENTANYL CITRATE (PF) 100 MCG/2ML IJ SOLN
INTRAMUSCULAR | Status: DC | PRN
  Administered 2018-07-28 (×2): 50 ug via INTRAVENOUS

## 2018-07-28 MED ORDER — SODIUM CHLORIDE 0.9% FLUSH: 3.0000 mL | Freq: Two times a day (BID) | INTRAVENOUS | Status: DC

## 2018-07-28 MED ORDER — BUPIVACAINE-EPINEPHRINE (PF) 0.25% -1:200000 IJ SOLN
INTRAMUSCULAR | Status: AC
  Filled 2018-07-28: qty 30

## 2018-07-28 MED ORDER — MEPERIDINE HCL 50 MG/ML IJ SOLN: 6.2500 mg | INTRAMUSCULAR | Status: DC | PRN

## 2018-07-28 MED ORDER — MIDAZOLAM HCL 5 MG/5ML IJ SOLN
INTRAMUSCULAR | Status: DC | PRN
  Administered 2018-07-28 (×2): 1 mg via INTRAVENOUS

## 2018-07-28 MED ORDER — CEFAZOLIN SODIUM-DEXTROSE 2-4 GM/100ML-% IV SOLN
2.0000 g | INTRAVENOUS | Status: AC
  Administered 2018-07-28: 2 g via INTRAVENOUS
  Filled 2018-07-28: qty 100

## 2018-07-28 MED ORDER — SODIUM CHLORIDE 0.9 % IV SOLN: 250.0000 mL | INTRAVENOUS | Status: DC | PRN

## 2018-07-28 MED ORDER — PROPOFOL 500 MG/50ML IV EMUL
INTRAVENOUS | Status: DC | PRN
  Administered 2018-07-28: 125 ug/kg/min via INTRAVENOUS

## 2018-07-28 MED ORDER — DOCUSATE SODIUM 100 MG PO CAPS: 100.0000 mg | ORAL_CAPSULE | Freq: Two times a day (BID) | ORAL | 0 refills | Status: AC

## 2018-07-28 MED ORDER — SODIUM CHLORIDE 0.9% FLUSH: 3.0000 mL | INTRAVENOUS | Status: DC | PRN

## 2018-07-28 MED ORDER — FENTANYL CITRATE (PF) 100 MCG/2ML IJ SOLN: 25.0000 ug | INTRAMUSCULAR | Status: DC | PRN

## 2018-07-28 MED ORDER — PROPOFOL 10 MG/ML IV BOLUS
INTRAVENOUS | Status: AC
  Filled 2018-07-28: qty 20

## 2018-07-28 MED ORDER — DEXAMETHASONE SODIUM PHOSPHATE 4 MG/ML IJ SOLN
INTRAMUSCULAR | Status: DC | PRN
  Administered 2018-07-28: 10 mg via INTRAVENOUS

## 2018-07-28 MED ORDER — PROPOFOL 10 MG/ML IV BOLUS
INTRAVENOUS | Status: AC
Start: 2018-07-28 — End: ?
  Filled 2018-07-28: qty 20

## 2018-07-28 MED ORDER — ACETAMINOPHEN 325 MG PO TABS: 650.0000 mg | ORAL_TABLET | ORAL | Status: DC | PRN

## 2018-07-28 MED ORDER — OXYCODONE HCL 5 MG PO TABS: 5.0000 mg | ORAL_TABLET | ORAL | Status: DC | PRN

## 2018-07-28 MED ORDER — LIDOCAINE HCL (CARDIAC) PF 100 MG/5ML IV SOSY
PREFILLED_SYRINGE | INTRAVENOUS | Status: DC | PRN
  Administered 2018-07-28: 50 mg via INTRAVENOUS

## 2018-07-28 MED ORDER — 0.9 % SODIUM CHLORIDE (POUR BTL) OPTIME
TOPICAL | Status: DC | PRN
  Administered 2018-07-28: 1000 mL

## 2018-07-28 MED ORDER — ACETAMINOPHEN 500 MG PO TABS
1000.0000 mg | ORAL_TABLET | ORAL | Status: AC
  Administered 2018-07-28: 1000 mg via ORAL
  Filled 2018-07-28: qty 2

## 2018-07-28 MED ORDER — BUPIVACAINE-EPINEPHRINE 0.25% -1:200000 IJ SOLN
INTRAMUSCULAR | Status: DC | PRN
  Administered 2018-07-28: 6 mL

## 2018-07-28 MED ORDER — LACTATED RINGERS IV SOLN
INTRAVENOUS | Status: DC
  Administered 2018-07-28: 10:00:00 via INTRAVENOUS

## 2018-07-28 MED ORDER — FENTANYL CITRATE (PF) 100 MCG/2ML IJ SOLN
INTRAMUSCULAR | Status: AC
  Filled 2018-07-28: qty 2

## 2018-07-28 MED ORDER — HYDROCODONE-ACETAMINOPHEN 5-325 MG PO TABS: 1.0000 | ORAL_TABLET | Freq: Four times a day (QID) | ORAL | 0 refills | Status: DC | PRN

## 2018-07-28 SURGICAL SUPPLY — 31 items
BANDAGE ELASTIC 4 VELCRO ST LF (GAUZE/BANDAGES/DRESSINGS) ×2 IMPLANT
BLADE SURG 15 STRL LF DISP TIS (BLADE) ×1 IMPLANT
BLADE SURG 15 STRL SS (BLADE) ×1
COVER SURGICAL LIGHT HANDLE (MISCELLANEOUS) ×2 IMPLANT
DECANTER SPIKE VIAL GLASS SM (MISCELLANEOUS) ×2 IMPLANT
DERMABOND ADVANCED (GAUZE/BANDAGES/DRESSINGS)
DERMABOND ADVANCED .7 DNX12 (GAUZE/BANDAGES/DRESSINGS) IMPLANT
DRAPE LAPAROTOMY T 102X78X121 (DRAPES) IMPLANT
DRAPE LAPAROTOMY TRNSV 102X78 (DRAPE) IMPLANT
ELECT PENCIL ROCKER SW 15FT (MISCELLANEOUS) ×2 IMPLANT
ELECT REM PT RETURN 15FT ADLT (MISCELLANEOUS) ×2 IMPLANT
GAUZE SPONGE 4X4 12PLY STRL (GAUZE/BANDAGES/DRESSINGS) ×2 IMPLANT
GLOVE BIO SURGEON STRL SZ 6 (GLOVE) ×2 IMPLANT
GLOVE INDICATOR 6.5 STRL GRN (GLOVE) ×2 IMPLANT
GOWN STRL REUS W/TWL LRG LVL3 (GOWN DISPOSABLE) ×2 IMPLANT
GOWN STRL REUS W/TWL XL LVL3 (GOWN DISPOSABLE) ×2 IMPLANT
KIT BASIN OR (CUSTOM PROCEDURE TRAY) ×2 IMPLANT
MARKER SKIN DUAL TIP RULER LAB (MISCELLANEOUS) IMPLANT
NEEDLE HYPO 22GX1.5 SAFETY (NEEDLE) ×2 IMPLANT
PACK BASIC VI WITH GOWN DISP (CUSTOM PROCEDURE TRAY) ×2 IMPLANT
PAD TELFA 2X3 NADH STRL (GAUZE/BANDAGES/DRESSINGS) ×2 IMPLANT
SPONGE LAP 18X18 RF (DISPOSABLE) IMPLANT
STAPLER VISISTAT 35W (STAPLE) IMPLANT
SUT ETHILON 3 0 PS 1 (SUTURE) ×2 IMPLANT
SUT MNCRL AB 4-0 PS2 18 (SUTURE) IMPLANT
SUT VIC AB 3-0 SH 27 (SUTURE) ×1
SUT VIC AB 3-0 SH 27XBRD (SUTURE) ×1 IMPLANT
SYR CONTROL 10ML LL (SYRINGE) ×2 IMPLANT
TOWEL OR 17X26 10 PK STRL BLUE (TOWEL DISPOSABLE) ×2 IMPLANT
TOWEL OR NON WOVEN STRL DISP B (DISPOSABLE) ×2 IMPLANT
YANKAUER SUCT BULB TIP 10FT TU (MISCELLANEOUS) IMPLANT

## 2018-07-28 NOTE — Anesthesia Preprocedure Evaluation (Addendum)
Anesthesia Evaluation  Patient identified by MRN, date of birth, ID band Patient awake    Reviewed: Allergy & Precautions, NPO status , Patient's Chart, lab work & pertinent test results  Airway Mallampati: IV  TM Distance: >3 FB Neck ROM: Full    Dental no notable dental hx.    Pulmonary neg pulmonary ROS,    Pulmonary exam normal breath sounds clear to auscultation       Cardiovascular hypertension, Pt. on medications + CAD  Normal cardiovascular exam Rhythm:Regular Rate:Normal  ECG: SB, rate 69  ECHO:- Left ventricle: The cavity size was normal. Wall thickness was normal. Systolic function was normal. The estimated ejection fraction was in the range of 60% to 65%. Wall motion was normal; there were no regional wall motion abnormalities. Doppler parameters are consistent with abnormal left ventricular relaxation (grade 1 diastolic dysfunction). - Mitral valve: Calcified annulus.  Sees cardiologist ([facility])     Neuro/Psych TIACVA (left leg weakness), Residual Symptoms negative psych ROS   GI/Hepatic Neg liver ROS, GERD  Medicated and Controlled,  Endo/Other  diabetes, Well Controlled, Oral Hypoglycemic Agents  Renal/GU negative Renal ROS  negative genitourinary   Musculoskeletal  (+) Arthritis , Osteoarthritis,    Abdominal   Peds negative pediatric ROS (+)  Hematology  (+) anemia ,   Anesthesia Other Findings Gout Hyperlipidemia  Loop recorder  Reproductive/Obstetrics negative OB ROS                            Anesthesia Physical  Anesthesia Plan  ASA: III  Anesthesia Plan: MAC   Post-op Pain Management:    Induction:   PONV Risk Score and Plan: 2 and Ondansetron, Dexamethasone and Propofol  Airway Management Planned: Nasal Cannula, Natural Airway and Mask  Additional Equipment:   Intra-op Plan:   Post-operative Plan:   Informed Consent: I have reviewed the patients  History and Physical, chart, labs and discussed the procedure including the risks, benefits and alternatives for the proposed anesthesia with the patient or authorized representative who has indicated his/her understanding and acceptance.   Dental advisory given  Plan Discussed with: CRNA, Anesthesiologist and Surgeon  Anesthesia Plan Comments:        Anesthesia Quick Evaluation

## 2018-07-28 NOTE — Interval H&P Note (Signed)
History and Physical Interval Note:  07/28/2018 9:16 AM  Justin [name]  has presented today for surgery, with the diagnosis of Right forearm mass  The various methods of treatment have been discussed with the patient and family. After consideration of risks, benefits and other options for treatment, the patient has consented to  Procedure(s): EXCISION OF RIGHT FOREARM MASS (Right) as a surgical intervention .  The patient's history has been reviewed, patient examined, no change in status, stable for surgery.  I have reviewed the patient's chart and labs.  Questions were answered to the patient's satisfaction.     Graclyn Lawther Rich Brave

## 2018-07-28 NOTE — Transfer of Care (Deleted)
Immediate Anesthesia Transfer of Care Note  Patient: Justin [name]  Procedure(s) Performed: EXCISION OF RIGHT FOREARM MASS (Right )  Patient Location: PACU  Anesthesia Type:General  Level of Consciousness: awake, oriented, drowsy, patient cooperative and responds to stimulation  Airway & Oxygen Therapy: Patient Spontanous Breathing and Patient connected to face mask oxygen  Post-op Assessment: Report given to RN, Post -op Vital signs reviewed and stable and Patient moving all extremities  Post vital signs: Reviewed and stable  Last Vitals:  Vitals Value Taken Time  BP    Temp    Pulse    Resp    SpO2      Last Pain:  Vitals:   07/28/18 0929  TempSrc:   PainSc: 0-No pain         Complications: No apparent anesthesia complications

## 2018-07-28 NOTE — Anesthesia Postprocedure Evaluation (Signed)
Anesthesia Post Note  Patient: Justin [name]  Procedure(s) Performed: EXCISION OF RIGHT FOREARM MASS (Right )     Patient location during evaluation: PACU Anesthesia Type: General Level of consciousness: awake and alert Pain management: pain level controlled Vital Signs Assessment: post-procedure vital signs reviewed and stable Respiratory status: spontaneous breathing, nonlabored ventilation, respiratory function stable and patient connected to nasal cannula oxygen Cardiovascular status: blood pressure returned to baseline and stable Postop Assessment: no apparent nausea or vomiting Anesthetic complications: no    Last Vitals:  Vitals:   07/28/18 0908 07/28/18 1242  BP: (!) 140/93 120/73  Pulse: 76 63  Resp: 18 12  Temp: 36.8 C 36.4 C  SpO2: 100% 100%    Last Pain:  Vitals:   07/28/18 1242  TempSrc:   PainSc: 0-No pain                 Rylin Saez

## 2018-07-28 NOTE — Op Note (Signed)
Operative Note  Demonie Kassa  [id number]  [id number]  07/28/2018   Surgeon: Vikki Ports A ConnorMD  Assistant: none  Procedure performed: excision of fungating 2.5cm superficial right forearm mass   Preop diagnosis: right forearm mass Post-op diagnosis/intraop findings: same  Specimens: right forearm mass for pathology, fungal stain and culture, and acid fast stain Retained items: no EBL: minimal cc Complications: none  Description of procedure: After obtaining informed consent the patient was taken to the operating room and placed supine on operating room table wheregeneral LMA anesthesia was initiated, preoperative antibiotics were administered, SCDs applied, and a formal timeout was performed. The right forearm was prepped and draped in the usual sterile fashion. After infiltration with local a longitudinal elliptical incision was made around the lesion. Cautery was used to divide the dermis and excise the lesion which went down to but did not penetrate the fascia. The lesion was handed off for pathology. Hemostasis was ensured in the wound. Subcutaneous flaps were raised at the wider aspect of the incision to reduce tension on the closure. Interrupted deep dermal 3-0 vicryls were used to approximated the wound followed by interrupted simple 3-0 nylons. There is some tension at the central aspect of the wound and the skin edges are not completely apposed for a span of about 1cm. Telfa, gauze and a loose ace wrap were then used to dress the wound.  The patient was then awakened, extubated and taken to PACU in stable condition.   All counts were correct at the completion of the case.

## 2018-07-28 NOTE — Transfer of Care (Signed)
Immediate Anesthesia Transfer of Care Note  Patient: Justin [name]  Procedure(s) Performed: EXCISION OF RIGHT FOREARM MASS (Right )  Patient Location: PACU  Anesthesia Type:General  Level of Consciousness: awake, alert , oriented, patient cooperative and lethargic  Airway & Oxygen Therapy: Patient Spontanous Breathing and Patient connected to face mask oxygen  Post-op Assessment: Report given to RN, Post -op Vital signs reviewed and stable and Patient moving all extremities  Post vital signs: Reviewed and stable  Last Vitals:  Vitals Value Taken Time  BP 125/72 07/28/2018 12:45 PM  Temp 36.4 C 07/28/2018 12:42 PM  Pulse 56 07/28/2018 12:46 PM  Resp 10 07/28/2018 12:46 PM  SpO2 100 % 07/28/2018 12:46 PM  Vitals shown include unvalidated device data.  Last Pain:  Vitals:   07/28/18 1242  TempSrc:   PainSc: 0-No pain         Complications: No apparent anesthesia complications

## 2018-07-28 NOTE — Discharge Instructions (Signed)
GENERAL SURGERY: POST OP INSTRUCTIONS  ######################################################################  EAT Gradually transition to a high fiber diet with a fiber supplement over the next few weeks after discharge.  Start with a pureed / full liquid diet (see below)  WALK Walk an hour a day.  Control your pain to do that.    CONTROL PAIN Control pain so that you can walk, sleep, tolerate sneezing/coughing, go up/down stairs.  HAVE A BOWEL MOVEMENT DAILY Keep your bowels regular to avoid problems.  OK to try a laxative to override constipation.  OK to use an antidairrheal to slow down diarrhea.  Call if not better after 2 tries  CALL IF YOU HAVE PROBLEMS/CONCERNS Call if you are still struggling despite following these instructions. Call if you have concerns not answered by these instructions  ######################################################################    1. DIET: Follow a light bland diet the first 24 hours after arrival home, such as soup, liquids, crackers, etc.  Be sure to include lots of fluids daily.  Avoid fast food or heavy meals as your are more likely to get nauseated.   2. Take your usually prescribed home medications unless otherwise directed. 3. PAIN CONTROL: a. Pain is best controlled by a usual combination of three different methods TOGETHER: i. Ice/Heat ii. Over the counter pain medication iii. Prescription pain medication b. Most patients will experience some swelling and bruising around the incisions.  Ice packs or heating pads (30-60 minutes up to 6 times a day) will help. Use ice for the first few days to help decrease swelling and bruising, then switch to heat to help relax tight/sore spots and speed recovery.  Some people prefer to use ice alone, heat alone, alternating between ice & heat.  Experiment to what works for you.  Swelling and bruising can take several weeks to resolve.   c. It is helpful to take an over-the-counter pain medication  regularly for the first few weeks.  Choose one of the following that works best for you: i. Naproxen (Aleve, etc)  Two 220mg  tabs twice a day ii. Ibuprofen (Advil, etc) Three 200mg  tabs four times a day (every meal & bedtime) iii. Acetaminophen (Tylenol, etc) 500-650mg  four times a day (every meal & bedtime) d. A  prescription for pain medication (such as oxycodone, hydrocodone, etc) should be given to you upon discharge.  Take your pain medication as prescribed.  i. If you are having problems/concerns with the prescription medicine (does not control pain, nausea, vomiting, rash, itching, etc), please call us [phone] to see if we need to switch you to a different pain medicine that will work better for you and/or control your side effect better. ii. If you need a refill on your pain medication, please contact your pharmacy.  They will contact our office to request authorization. Prescriptions will not be filled after 5 pm or on week-ends. 4. Avoid getting constipated.  Between the surgery and the pain medications, it is common to experience some constipation.  Increasing fluid intake and taking a fiber supplement (such as Metamucil, Citrucel, FiberCon, MiraLax, etc) 1-2 times a day regularly will usually help prevent this problem from occurring.  A mild laxative (prune juice, Milk of Magnesia, MiraLax, etc) should be taken according to package directions if there are no bowel movements after 48 hours.   5. Wash / shower every day.   Keep the ace wrap dry.   You will be scheduled for a wound check in 2-3 days, keep the dressings in place until then.  6. ACTIVITIES as tolerated:   a. You may resume regular (light) daily activities beginning the next day--such as daily self-care, walking, climbing stairs--gradually increasing activities as tolerated.  If you can walk 30 minutes without difficulty, it is safe to try more intense activity such as jogging, treadmill, bicycling, low-impact  aerobics, swimming, etc. b. Save the most intensive and strenuous activity for last such as sit-ups, heavy lifting, contact sports, etc  Refrain from any heavy lifting or straining until you are off narcotics for pain control.   c. DO NOT PUSH THROUGH PAIN.  Let pain be your guide: If it hurts to do something, don't do it.  Pain is your body warning you to avoid that activity for another week until the pain goes down. d. You may drive when you are no longer taking prescription pain medication, you can comfortably wear a seatbelt, and you can safely maneuver your car and apply brakes. e. Dennis Bast may have sexual intercourse when it is comfortable.  7. FOLLOW UP in our office a. Please call [facility] at [phone] to set up an appointment to see your surgeon in the office for a follow-up appointment approximately 2-3 weeks after your surgery. b. Make sure that you call for this appointment the day you arrive home to insure a convenient appointment time. 9. IF YOU HAVE DISABILITY OR FAMILY LEAVE FORMS, BRING THEM TO THE OFFICE FOR PROCESSING.  DO NOT GIVE THEM TO YOUR DOCTOR.   WHEN TO CALL us [phone]: 1. Poor pain control 2. Reactions / problems with new medications (rash/itching, nausea, etc)  3. Fever over 101.5 F (38.5 C) 4. Worsening swelling or bruising 5. Continued bleeding from incision. 6. Increased pain, redness, or drainage from the incision 7. Difficulty breathing / swallowing   The clinic staff is available to answer your questions during regular business hours (8:30am-5pm).  Please dont hesitate to call and ask to speak to one of our nurses for clinical concerns.   If you have a medical emergency, go to the nearest emergency room or call 911.  A surgeon from [facility] is always on call at the [facility], [place], [place], [place], [place]  [address] ? MAIN: [phone] ? TOLL FREE: [phone] ?  FAX ([phone]  [phone] [email or web address]

## 2018-07-28 NOTE — Anesthesia Procedure Notes (Signed)
Procedure Name: LMA Insertion Date/Time: 07/28/2018 12:14 PM Performed by: Janeece Riggers, MD Pre-anesthesia Checklist: Patient identified, Emergency Drugs available, Suction available, Patient being monitored and Timeout performed Patient Re-evaluated:Patient Re-evaluated prior to induction Oxygen Delivery Method: Circle system utilized Preoxygenation: Pre-oxygenation with 100% oxygen Induction Type: IV induction Ventilation: Mask ventilation without difficulty LMA: LMA inserted LMA Size: 4.0 Number of attempts: 2 Tube secured with: Tape Dental Injury: Teeth and Oropharynx as per pre-operative assessment  Difficulty Due To: Difficult Airway- due to limited oral opening

## 2018-07-29 ENCOUNTER — Encounter (HOSPITAL_COMMUNITY): Payer: Self-pay | Admitting: Surgery

## 2018-07-29 LAB — ACID FAST SMEAR (AFB, MYCOBACTERIA)

## 2018-07-29 LAB — ACID FAST SMEAR (AFB): ACID FAST SMEAR - AFSCU2: NEGATIVE

## 2018-07-29 NOTE — Addendum Note (Signed)
Addendum  created 07/29/18 0918 by Lollie Sails, CRNA   Charge Capture section accepted

## 2018-07-30 LAB — FUNGUS STAIN

## 2018-08-11 ENCOUNTER — Telehealth: Payer: Self-pay | Admitting: Cardiology

## 2018-08-11 NOTE — Telephone Encounter (Signed)
Spoke w/ pt and requested that he send a manual transmission b/c his home monitor has not updated in at least 14 days.   

## 2018-08-12 ENCOUNTER — Ambulatory Visit (INDEPENDENT_AMBULATORY_CARE_PROVIDER_SITE_OTHER): Payer: Medicare Other | Admitting: *Deleted

## 2018-08-12 DIAGNOSIS — Z8673 Personal history of transient ischemic attack (TIA), and cerebral infarction without residual deficits: Secondary | ICD-10-CM

## 2018-08-13 ENCOUNTER — Telehealth: Payer: Self-pay | Admitting: Hematology

## 2018-08-13 NOTE — Progress Notes (Signed)
Carelink Summary Report / Loop Recorder 

## 2018-08-13 NOTE — Telephone Encounter (Signed)
Tried to reach regarding voicemail I did leave a message °

## 2018-08-18 ENCOUNTER — Other Ambulatory Visit: Payer: Self-pay | Admitting: Internal Medicine

## 2018-08-18 NOTE — Telephone Encounter (Signed)
Rx sent to pharmacy   

## 2018-08-21 ENCOUNTER — Other Ambulatory Visit: Payer: Self-pay

## 2018-08-21 DIAGNOSIS — C9000 Multiple myeloma not having achieved remission: Secondary | ICD-10-CM

## 2018-08-21 MED ORDER — LENALIDOMIDE 15 MG PO CAPS: 15.0000 mg | ORAL_CAPSULE | Freq: Every day | ORAL | 0 refills | Status: DC

## 2018-08-25 MED FILL — NINLARO 4 MG CAP: 4 | 28 days supply | Qty: 3 | Fill #2

## 2018-08-26 LAB — FUNGUS CULTURE WITH STAIN

## 2018-08-26 LAB — FUNGAL ORGANISM REFLEX

## 2018-08-26 LAB — FUNGUS CULTURE RESULT

## 2018-08-27 LAB — CUP PACEART REMOTE DEVICE CHECK
Implantable Pulse Generator Implant Date: 20170613
MDC IDC SESS DTM: 20190719184008

## 2018-09-01 ENCOUNTER — Inpatient Hospital Stay: Payer: Medicare Other | Attending: Hematology

## 2018-09-01 DIAGNOSIS — R197 Diarrhea, unspecified: Secondary | ICD-10-CM | POA: Insufficient documentation

## 2018-09-01 DIAGNOSIS — R110 Nausea: Secondary | ICD-10-CM | POA: Diagnosis not present

## 2018-09-01 DIAGNOSIS — D6481 Anemia due to antineoplastic chemotherapy: Secondary | ICD-10-CM | POA: Diagnosis not present

## 2018-09-01 DIAGNOSIS — Z7901 Long term (current) use of anticoagulants: Secondary | ICD-10-CM | POA: Insufficient documentation

## 2018-09-01 DIAGNOSIS — R42 Dizziness and giddiness: Secondary | ICD-10-CM | POA: Diagnosis not present

## 2018-09-01 DIAGNOSIS — Z8673 Personal history of transient ischemic attack (TIA), and cerebral infarction without residual deficits: Secondary | ICD-10-CM | POA: Insufficient documentation

## 2018-09-01 DIAGNOSIS — G620 Drug-induced polyneuropathy: Secondary | ICD-10-CM | POA: Diagnosis not present

## 2018-09-01 DIAGNOSIS — Z85828 Personal history of other malignant neoplasm of skin: Secondary | ICD-10-CM | POA: Insufficient documentation

## 2018-09-01 DIAGNOSIS — T451X5A Adverse effect of antineoplastic and immunosuppressive drugs, initial encounter: Secondary | ICD-10-CM | POA: Insufficient documentation

## 2018-09-01 DIAGNOSIS — D6959 Other secondary thrombocytopenia: Secondary | ICD-10-CM | POA: Insufficient documentation

## 2018-09-01 DIAGNOSIS — B370 Candidal stomatitis: Secondary | ICD-10-CM | POA: Insufficient documentation

## 2018-09-01 DIAGNOSIS — C9000 Multiple myeloma not having achieved remission: Secondary | ICD-10-CM | POA: Insufficient documentation

## 2018-09-01 DIAGNOSIS — I480 Paroxysmal atrial fibrillation: Secondary | ICD-10-CM | POA: Diagnosis not present

## 2018-09-01 DIAGNOSIS — E538 Deficiency of other specified B group vitamins: Secondary | ICD-10-CM | POA: Diagnosis not present

## 2018-09-01 LAB — CMP (CANCER CENTER ONLY)
ALT: 10 U/L (ref 0–44)
AST: 31 U/L (ref 15–41)
Albumin: 3.3 g/dL — ABNORMAL LOW (ref 3.5–5.0)
Alkaline Phosphatase: 61 U/L (ref 38–126)
Anion gap: 10 (ref 5–15)
BUN: 20 mg/dL (ref 8–23)
CHLORIDE: 104 mmol/L (ref 98–111)
CO2: 25 mmol/L (ref 22–32)
CREATININE: 1.24 mg/dL (ref 0.61–1.24)
Calcium: 9.3 mg/dL (ref 8.9–10.3)
GFR, EST NON AFRICAN AMERICAN: 54 mL/min — AB (ref >60)
GFR, Est AFR Am: >60 mL/min (ref >60)
Glucose, Bld: 114 mg/dL — ABNORMAL HIGH (ref 70–99)
Potassium: 3.8 mmol/L (ref 3.5–5.1)
Sodium: 139 mmol/L (ref 135–145)
Total Bilirubin: 0.4 mg/dL (ref 0.3–1.2)
Total Protein: 6.3 g/dL — ABNORMAL LOW (ref 6.5–8.1)

## 2018-09-01 LAB — CBC WITH DIFFERENTIAL/PLATELET
Basophils Absolute: 0 10*3/uL (ref 0.0–0.1)
Basophils Relative: 1 %
EOS ABS: 0.1 10*3/uL (ref 0.0–0.5)
EOS PCT: 3 %
HCT: 32.6 % — ABNORMAL LOW (ref 38.4–49.9)
Hemoglobin: 10.9 g/dL — ABNORMAL LOW (ref 13.0–17.1)
LYMPHS ABS: 0.3 10*3/uL — AB (ref 0.9–3.3)
Lymphocytes Relative: 11 %
MCH: 31.7 pg (ref 27.2–33.4)
MCHC: 33.3 g/dL (ref 32.0–36.0)
MCV: 95.1 fL (ref 79.3–98.0)
MONO ABS: 0.4 10*3/uL (ref 0.1–0.9)
MONOS PCT: 14 %
Neutro Abs: 2 10*3/uL (ref 1.5–6.5)
Neutrophils Relative %: 71 %
PLATELETS: 28 10*3/uL — AB (ref 140–400)
RBC: 3.43 MIL/uL — ABNORMAL LOW (ref 4.20–5.82)
RDW: 16.3 % — AB (ref 11.0–14.6)
WBC: 2.8 10*3/uL — ABNORMAL LOW (ref 4.0–10.3)

## 2018-09-02 LAB — MULTIPLE MYELOMA PANEL, SERUM
Albumin SerPl Elph-Mcnc: 3.1 g/dL (ref 2.9–4.4)
Albumin/Glob SerPl: 1.2 (ref 0.7–1.7)
Alpha 1: 0.3 g/dL (ref 0.0–0.4)
Alpha2 Glob SerPl Elph-Mcnc: 0.9 g/dL (ref 0.4–1.0)
B-Globulin SerPl Elph-Mcnc: 0.9 g/dL (ref 0.7–1.3)
GAMMA GLOB SERPL ELPH-MCNC: 0.6 g/dL (ref 0.4–1.8)
GLOBULIN, TOTAL: 2.8 g/dL (ref 2.2–3.9)
IGA: 25 mg/dL — AB (ref 61–437)
IGG (IMMUNOGLOBIN G), SERUM: 709 mg/dL (ref 700–1600)
IgM (Immunoglobulin M), Srm: 15 mg/dL (ref 15–143)
M Protein SerPl Elph-Mcnc: 0.3 g/dL — ABNORMAL HIGH
Total Protein ELP: 5.9 g/dL — ABNORMAL LOW (ref 6.0–8.5)

## 2018-09-03 LAB — KAPPA/LAMBDA LIGHT CHAINS
Kappa free light chain: 17.4 mg/L (ref 3.3–19.4)
Kappa, lambda light chain ratio: 0.98 (ref 0.26–1.65)
Lambda free light chains: 17.7 mg/L (ref 5.7–26.3)

## 2018-09-03 NOTE — Progress Notes (Signed)
[name] Kitchen    HEMATOLOGY/ONCOLOGY CLINIC NOTE  Date of Service: 09/04/2018    Patient Care Team: Mayra Neer, MD as PCP - General (Family Medicine)  CHIEF COMPLAINTS/:   F/u for continued mx of Myeloma  HISTORY OF PRESENTING ILLNESS:   Justin [name] is a wonderful 77 y.o. 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  Patient has a history of hypertension, diabetes, GERD, gout, squamous cell skin cancers, elevated PSA, SVT status post ablation in May 2016, chronic back pain who has a history of recurrent CVA/TIA's. He apparently had a left hemispheric TIA June 2017 and has had recurrent) subcortical infarcts thought to be related to small vessel disease. He was noted to have a small PFO that was of questionable significance. He was initially on aspirin and then continued and aspirin + plavix for secondary stroke prevention. He follows with Dr. Antony Contras for his neurology cares.  An SPEP was done due to elevated total proteins of 8.9 and showed an M spike of 2 g/dL. He was also noted to have an elevated total protein of 8.6 in April 2017. He was referred to [place] for further evaluation of his M spike. Blood test did not reveal any hypercalcemia, no significant renal failure. He has been noted to have developed anemia since June the serum and his hemoglobin was 12.6 and is now down to the mid 10 range. He notes no focal bone pains at this time. Overt acute weight loss. No fevers no chills no night sweats.  CURRENT THERAPY:    Ixazomib+ Revlimid + Dexamethasone  INTERVAL HISTORY   Justin [name] presents to the office today for follow-up of his myeloma. The patient's last visit with [place] was on 07/21/18.   Of note, pt underwent surgical excision of right forearm mass on 07/28/18. 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 He presents to the clinic today by himself. He notes he is not doing very well lately.   He notes  his surgery went well. Due to infecgtion, after surgery he was put on antibiotics. He is almost done with his dose of this.   He notes he has had significant diarrhea with liquid stool for the past week. Today he has had diarrhea twice this morning. He has had mild abdominal cramping form this. This is day 9 of current Revlimid cycle. He also notes intermittent nausea and is afraid to take medication to as he does not want to throw it up.   He notes now progressed and consistent neuropathy with tingling in b/l feet and half of low legs. He is regularly taking B12.   He notes dizziness which gives a spinning feeling when moving around. He also loss of taste which can be from his medication as well. He is willing to follow up with PCP and possibly see a ENT.      MEDICAL HISTORY:  Past Medical History:  Diagnosis Date  . Anemia   . Arthritis   . 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 Diabetes mellitus without complication ([place])   . GERD (gastroesophageal reflux disease)   . Gout   . History of loop recorder   . Hypertension   . Left leg weakness   . Paroxysmal SVT (supraventricular tachycardia) ([place])    a. s/p RFCA on 05/01/15  . Stroke [facility]) 2017   TIA and mild stroke; denies stroke symptoms  since then     SURGICAL HISTORY: Past Surgical History:  Procedure Laterality Date  . ATRIAL FIBRILLATION ABLATION     Dr. Lovena Le  . BASAL CELL CARCINOMA EXCISION     off of back  . ELECTROPHYSIOLOGIC STUDY N/A 05/01/2015   Procedure: SVT Ablation;  Surgeon: Evans Lance, MD;  Location: [place] [facility];  Service: Cardiovascular;  Laterality: N/A;  . EP IMPLANTABLE DEVICE N/A 06/04/2016   Procedure: Loop Recorder Insertion;  Surgeon: Thompson Grayer, MD;  Location: [place] [facility];  Service: Cardiovascular;  Laterality: N/A;  . INGUINAL HERNIA REPAIR Bilateral 06/16/2017   Procedure: LAPAROSCOPIC BILATERAL INGUINAL HERNIA REPAIR;  Surgeon: Clovis Riley,  MD;  Location: [place];  Service: General;  Laterality: Bilateral;  . INSERTION OF MESH Bilateral 06/16/2017   Procedure: INSERTION OF MESH;  Surgeon: Clovis Riley, MD;  Location: [place];  Service: General;  Laterality: Bilateral;  . MASS EXCISION Right 07/28/2018   Procedure: EXCISION OF RIGHT FOREARM MASS;  Surgeon: Clovis Riley, MD;  Location: [facility];  Service: General;  Laterality: Right;  . SQUAMOUS CELL CARCINOMA EXCISION    . TEE WITHOUT CARDIOVERSION N/A 06/04/2016   Procedure: TRANSESOPHAGEAL ECHOCARDIOGRAM (TEE);  Surgeon: Pixie Casino, MD;  Location: [place] [facility];  Service: Cardiovascular;  Laterality: N/A;    SOCIAL HISTORY: Social History   Socioeconomic History  . Marital status: Married    Spouse name: Not on file  . Number of children: Not on file  . Years of education: Not on file  . Highest education level: Not on file  Occupational History  . Not on file  Social Needs  . Financial resource strain: Not on file  . Food insecurity:    Worry: Not on file    Inability: Not on file  . Transportation needs:    Medical: Not on file    Non-medical: Not on file  Tobacco Use  . Smoking status: Never Smoker  . Smokeless tobacco: Never Used  Substance and Sexual Activity  . Alcohol use: No  . Drug use: No  . Sexual activity: Not on file  Lifestyle  . Physical activity:    Days per week: Not on file    Minutes per session: Not on file  . Stress: Not on file  Relationships  . Social connections:    Talks on phone: Not on file    Gets together: Not on file    Attends religious service: Not on file    Active member of club or organization: Not on file    Attends meetings of clubs or organizations: Not on file    Relationship status: Not on file  . Intimate partner violence:    Fear of current or ex partner: Not on file    Emotionally abused: Not on file    Physically abused: Not on file    Forced sexual activity: Not on file  Other Topics Concern  . Not  on file  Social History Narrative  . Not on file    FAMILY HISTORY: Family History  Problem Relation Age of Onset  . Stroke Mother   . Hypertension Mother   . Alzheimer's disease Father   . Heart failure Father   . Down syndrome Daughter     ALLERGIES:  is allergic to no known allergies.  MEDICATIONS:  Current Outpatient Medications  Medication Sig Dispense Refill  . acyclovir (ZOVIRAX) 400 MG tablet Take 1 tablet (400 mg total) by mouth daily.  90 tablet 0  . allopurinol (ZYLOPRIM) 300 MG tablet Take 300 mg by mouth daily.     [name] Kitchen atorvastatin (LIPITOR) 40 MG tablet TAKE 1 TABLET EVERY MORNING 90 tablet 4  . cyanocobalamin 1000 MCG tablet Take 1,000 mcg by mouth daily.    [name] Kitchen dexamethasone (DECADRON) 4 MG tablet TAKE 5 TABLETS (20MG) ONCE WEEKLY ON DAYS 1, 8, 15 AND 21 OF EVERY 28 DAY CYCLE 60 tablet 4  . fluorouracil (EFUDEX) 5 % cream Apply 1 application topically daily as needed (for skin cancer prevention).   0  . glucose monitoring kit (FREESTYLE) monitoring kit 1 each by Does not apply route as needed for other.    . HYDROcodone-acetaminophen (NORCO/VICODIN) 5-325 MG tablet Take 1 tablet by mouth every 6 (six) hours as needed for moderate pain. 20 tablet 0  . lactobacillus acidophilus & bulgar (LACTINEX) chewable tablet Chew 1 tablet by mouth 3 (three) times daily with meals. 30 tablet 0  . lenalidomide (REVLIMID) 15 MG capsule Take 1 capsule (15 mg total) by mouth daily. Take for 21 days on, 7 days off, repeat every 28 days. Authorization #[id number] 06/18/18 21 capsule 0  . metFORMIN (GLUCOPHAGE) 1000 MG tablet Take 1,000 mg by mouth daily with breakfast.    . NINLARO 4 MG capsule TAKE 1 CAPSULE (4MG) BY MOUTH ONCE WEEKLY ON DAYS 1, 8, AND 15, OF EACH 28 DAY CYCLE. TAKE ON AN EMPTY STOMACH 1HR BEFORE OR 2HRS AFTER FOOD 3 capsule 2  . nitroGLYCERIN (NITROSTAT) 0.4 MG SL tablet Place 1 tablet (0.4 mg total) under the tongue every 5 (five) minutes as needed for chest pain. 25 tablet 3    . nystatin (MYCOSTATIN) 100000 UNIT/ML suspension Take 5 mLs (500,000 Units total) by mouth 4 (four) times daily. 200 mL 0  . omeprazole (PRILOSEC) 20 MG capsule Take 20 mg by mouth every morning.     . rivaroxaban (XARELTO) 20 MG TABS tablet Take 1 tablet (20 mg total) by mouth daily with supper. 90 tablet 2  . telmisartan-hydrochlorothiazide (MICARDIS HCT) 80-12.5 MG tablet Take 1 tablet by mouth daily. 90 tablet 3   No current facility-administered medications for this visit.     REVIEW OF SYSTEMS:    A 10+ POINT REVIEW OF SYSTEMS WAS OBTAINED including neurology, dermatology, psychiatry, cardiac, respiratory, lymph, extremities, GI, GU, Musculoskeletal, constitutional, breasts, reproductive, HEENT. All pertinent positives are noted in the HPI.  All others are negative.   PHYSICAL EXAMINATION:   ECOG PERFORMANCE STATUS: 2 - Symptomatic, <50% confined to bed  Vitals:   09/04/18 0948  BP: 99/60  Pulse: 68  Resp: 20  Temp: 98.6 F (37 C)  SpO2: 100%   Filed Weights   09/04/18 0948  Weight: 167 lb 14.4 oz (76.2 kg)   Body mass index is 25.53 kg/m.  GENERAL:alert, in no acute distress and comfortable SKIN: no acute rashes (+) previous right wrist raised indulated lesion with crusting concerning for cutaneous squamous cell carcinoma, now healing after surgery, currently bandaged EYES: conjunctiva are pink and non-injected, sclera anicteric OROPHARYNX: MMM, no exudates, no oropharyngeal erythema or ulceration (+) oral thrush  NECK: supple, no JVD LYMPH:  no palpable lymphadenopathy in the cervical, axillary or inguinal regions LUNGS: clear to auscultation b/l with normal respiratory effort HEART: regular rate & rhythm ABDOMEN:  normoactive bowel sounds , non tender, not distended. No palpable hepatosplenomegaly.  Extremity: no pedal edema PSYCH: alert & oriented x 3 with fluent speech NEURO: no focal motor/sensory deficits  LABORATORY DATA:  I have reviewed the data as  listed. CBC Latest Ref Rng & Units 09/01/2018 07/23/2018 07/21/2018  WBC 4.0 - 10.3 K/uL 2.8(L) 3.2(L) 3.9(L)  Hemoglobin 13.0 - 17.1 g/dL 10.9(L) 11.9(L) 11.3(L)  Hematocrit 38.4 - 49.9 % 32.6(L) 36.1(L) 34.6(L)  Platelets 140 - 400 K/uL 28(L) 184 149  ANC 2.0K  CMP Latest Ref Rng & Units 09/01/2018 07/23/2018 07/21/2018  Glucose 70 - 99 mg/dL 114(H) 123(H) 94  BUN 8 - 23 mg/dL 20 20 21   Creatinine 0.61 - 1.24 mg/dL 1.24 0.96 1.00  Sodium 135 - 145 mmol/L 139 143 141  Potassium 3.5 - 5.1 mmol/L 3.8 3.9 4.0  Chloride 98 - 111 mmol/L 104 104 105  CO2 22 - 32 mmol/L 25 30 30   Calcium 8.9 - 10.3 mg/dL 9.3 9.1 9.6  Total Protein 6.5 - 8.1 g/dL 6.3(L) - 6.6  Total Bilirubin 0.3 - 1.2 mg/dL 0.4 - 0.5  Alkaline Phos 38 - 126 U/L 61 - 66  AST 15 - 41 U/L 31 - 18  ALT 0 - 44 U/L 10 - 9    Lab Results  Component Value Date   IRON 45 06/02/2017   TIBC 233 06/02/2017   IRONPCTSAT 19 (L) 06/02/2017   (Iron and TIBC)  Lab Results  Component Value Date   FERRITIN 308 08/26/2017         RADIOGRAPHIC STUDIES: I have personally reviewed the radiological images as listed and agreed with the findings in the report.       Bone Marrow Biopsy 12/24/2016 (Accession [id number])  Diagnosis Bone Marrow, Aspirate,Biopsy, and Clot, left iliac crest - HYPERCELLULAR BONE MARROW FOR AGE WITH PLASMA CELL NEOPLASM. - SEE COMMENT. PERIPHERAL BLOOD: - NORMOCYTIC-NORMOCHROMIC ANEMIA. - LEUKOPENIA.  DG Bone Survey Met (Accession [id number]) (Order [id number])  Imaging  Date: 11/28/2016 Department: [facility] Released By: Pricilla Riffle Authorizing: Brunetta Genera, MD  Exam Information   Status Exam Begun  Exam Ended   Final [99] 11/28/2016 11:59 AM 11/28/2016 12:36 PM  PACS Images   Show images for DG Bone Survey Met  Study Result   CLINICAL DATA:  Monoclonal gammopathy of unknown significance. History of squamous cell carcinoma excision, basal cell  carcinoma excision.  EXAM: METASTATIC BONE SURVEY  COMPARISON:  CT neck, chest, abdomen and pelvis from 10/29/16, MRI of the head from 06/01/2016, CXR 10/27/2016, lumbar spine radiographs 06/24/2016  FINDINGS: Lateral skull: Small occipital lucency which may represent a normal arachnoid granulation posteriorly in the occiput. No definite lytic abnormality.  Cervical spine AP and lateral: Disc space narrowing at C2-3, and from C4 through C7. C4-5, C5-6 and C6-7 uncovertebral joint spurring bilaterally. No lytic abnormality.  Thoracic spine AP and lateral: T8-9 right-sided osteophytes and to a lesser degree T9-10. No lytic abnormality. Slight multilevel lumbar thoracic disc space narrowing likely degenerative.  Lumbar spine AP and lateral: Disc space narrowing at L4-5 and to a greater extent L5-S1. No lytic disease. L3 through S1 facet sclerosis.  AP pelvis: Negative for lytic disease.  Bilateral upper and lower extremities shoulders through wrist and from the hips through ankle: Negative for lytic disease. Joint space narrowing of the femorotibial compartments both knees. Subchondral cyst of the right patella.  CXR: Clear lungs. Cardiac implantable monitoring device projects over the left heart. Aortic atherosclerosis. No lytic disease.  IMPRESSION: No findings suspicious for lytic disease.   Electronically Signed   By: Ashley Royalty M.D.   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 (12/2016) Peak M spike 2.6  Cytogenetics and Myeloma FISH panel.- trisomy 11 Patient has normocytic anemia without any other clear etiology. (>2g/dl lower that lower limit of normal which is 13) which per criteria would place this in the Multiple myeloma category as opposed to Smoldering Multiple myeloma (Borderline criterion)  No overt  renal failure or hypercalcemia at this time No focal bone pains though he has significant chronic back pain related to degenerative disc disease. Bone survey shows no overt lytic lesions.  2) anemia and thrombocytopenia -- related to treatment (Ixazomib + Revlimid)   3) Significant Diarrhea and intermittent nausea -Possibly secondary to Revlimid or recent antibiotics  4) Grade 2 Neuropathy -- in b/l feet and halfway up lower leg without pain -Seconadry to Automatic [employer] -Will monitor closely and will reduce medication if needed   PLAN -Discussed pt labwork today, from 09/01/18; HGB slightly lower at 10.9, plt decreased to 28K, otherwise blood counts and blood chemistries overall stable. M-spike has improved to 0.3, Lights chains WNL.  -Given his good response to treatment and recent toxicities, he will hold Ninlaro, Revlimid, and dexa for 2-3 weeks to let him recover.   -I strongly encouraged him to continue B12 at 104mcrograms and I instructed he start OTC B complex.  -Pt notes new onset of diarrhea over the past week. Given his liquid stool, I will do stool test to rule out infection. Pt is agreeable.  -I suggest antiemetics to help his nausea.  -When he has recovered I will restart his Revlimid at a lower dose. .   -I encouraged him to drink more water to stay hydrated, 64 ounces a day of non-alcohol, non-caffeine and non-carbonated liquids. -F/u in 4 weeks     5) Borderline low B12 levels, previously 299 --- on replacement - 04/2018 B12 level now improved to 724 -continue replacement.  6) recurrent CVA - thought to be related to small vessel disease as per neurology. Has a small PFO which could be an additional risk factor as well as his afib  7)  P afib Plan -Continue on Xarelto per cardiology.  8) H/o recurrent SCC 9) Occasional Rash, secondary to SCC 10) Oral Thrush - likely secondary to current antibiotics  Plan  -Right wrist raised indulated lesion with crusting concerning for  cutaneous squamous cell carcinoma seen in clinic on 06/23/18 -Underwent surgical excision on right forearm with Dr. CRomana Juniperon 07/28/18. His surgical pathology showed evidence of Squamous Cell Carcinoma. -Held Xarelto two days prior to surgery, returned to Xarelto at surgeon's clearance -After surgery he was given antibiotics due to the fungal infection he had which was found in surgery. He is almost done with dose.  -I instructed him to take Probiotic or live cultured yogurt while he is on antibiotics -He was given Efudex by Dermatologist, but stopped after he had skin breakout. -thrush was seen on 09/03/18 visit. I will call in Nystatic mouthwash   11) Lower extremity discomfort - improved, stable. Likely Discogenic pain -[place] 12/2017 revealed no blood clot -Pt has established care with orthopedist and PT.   12) intermittent Dizziness -I discussed this can happen from his diarrhea or vertigo. If this is persistent I recommend he see an ENT.    13) Patient Active Problem List   Diagnosis Date Noted  . History of loop  recorder 06/12/2017  . Cryptogenic stroke ([place]) 10/28/2016  . Gait abnormality   . History of CVA (cerebrovascular accident)   . History of TIA (transient ischemic attack)   . Benign essential HTN   . Paroxysmal SVT (supraventricular tachycardia) ([place])   . Acute blood loss anemia   . Acute ischemic stroke ([place])   . CVA (cerebral vascular accident) ([place]) 10/26/2016  . History of recent stroke 06/06/2016  . 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 TIA (transient ischemic attack) 06/02/2016  . Numbness 06/01/2016  . Right arm numbness 06/01/2016  . Atherosclerosis of native coronary artery of native heart without angina pectoris 03/20/2016  . Hypertension   . GERD (gastroesophageal reflux disease)   . Gout   . S/P RF ablation operation for arrhythmia 12/13/2014  . Hyperlipidemia 12/13/2014  . Controlled type 2 diabetes mellitus with complication, without  long-term current use of insulin ([place]) 12/07/2014   PLAN -Continue f/u with PCP (blood sugars)    Will hold Revlimid, Ninlaro, Dexa  Start OTC B complex, continue B12 I prescribed Nystatin Mouthwash for him to start  Labs today (stool studies only) Labs in 3 weeks RTC with Dr Irene Limbo in 4 weeks   All of the patients questions were answered with apparent satisfaction. The patient knows to call the clinic with any problems, questions or concerns.  The total time spent in the appt was 40 minutes and more than 50% was on counseling and direct patient cares.    Sullivan Lone MD MS AAHIVMS [facility] Hematology/Oncology Physician [facility]  (Office):       [phone] (Work cell):  [phone] (Fax):           [phone]  I, Joslyn Devon, am acting as scribe for Sullivan Lone, MD.    .I have reviewed the above documentation for accuracy and completeness, and I agree with the above.   Brunetta Genera MD    ADDENDUM Component     Latest Ref Rng & Units 09/04/2018  Campylobacter species     NOT DETECTED NOT DETECTED  Plesimonas shigelloides     NOT DETECTED NOT DETECTED  Salmonella species     NOT DETECTED NOT DETECTED  Yersinia enterocolitica     NOT DETECTED NOT DETECTED  Vibrio species     NOT DETECTED NOT DETECTED  Vibrio cholerae     NOT DETECTED NOT DETECTED  Enteroaggregative E coli (EAEC)     NOT DETECTED NOT DETECTED  Enteropathogenic E coli (EPEC)     NOT DETECTED DETECTED (A)  Enterotoxigenic E coli (ETEC)     NOT DETECTED NOT DETECTED  Shiga like toxin producing E coli (STEC)     NOT DETECTED NOT DETECTED  Shigella/Enteroinvasive E coli (EIEC)     NOT DETECTED NOT DETECTED  Cryptosporidium     NOT DETECTED NOT DETECTED  Cyclospora cayetanensis     NOT DETECTED NOT DETECTED  Entamoeba histolytica     NOT DETECTED NOT DETECTED  Giardia lamblia     NOT DETECTED NOT DETECTED  Adenovirus F40/41     NOT DETECTED NOT DETECTED    Astrovirus     NOT DETECTED NOT DETECTED  Norovirus GI/GII     NOT DETECTED NOT DETECTED  Rotavirus A     NOT DETECTED NOT DETECTED  Sapovirus (I, II, IV, and V)     NOT DETECTED NOT DETECTED   Plan -noted to have EPEC causing diarrhea. -ordered levofloxacin and patient informed.  Suzan Slick  Juleen China MD

## 2018-09-04 ENCOUNTER — Telehealth: Payer: Self-pay

## 2018-09-04 ENCOUNTER — Inpatient Hospital Stay (HOSPITAL_BASED_OUTPATIENT_CLINIC_OR_DEPARTMENT_OTHER): Payer: Medicare Other | Admitting: Hematology

## 2018-09-04 ENCOUNTER — Inpatient Hospital Stay: Payer: Medicare Other

## 2018-09-04 ENCOUNTER — Encounter: Payer: Self-pay | Admitting: Pharmacist

## 2018-09-04 VITALS — BP 99/60 | HR 68 | Temp 98.6°F | Resp 20 | Ht 68.0 in | Wt 167.9 lb

## 2018-09-04 DIAGNOSIS — E538 Deficiency of other specified B group vitamins: Secondary | ICD-10-CM

## 2018-09-04 DIAGNOSIS — C9000 Multiple myeloma not having achieved remission: Secondary | ICD-10-CM

## 2018-09-04 DIAGNOSIS — D6959 Other secondary thrombocytopenia: Secondary | ICD-10-CM | POA: Diagnosis not present

## 2018-09-04 DIAGNOSIS — R197 Diarrhea, unspecified: Secondary | ICD-10-CM

## 2018-09-04 DIAGNOSIS — R110 Nausea: Secondary | ICD-10-CM

## 2018-09-04 DIAGNOSIS — T451X5A Adverse effect of antineoplastic and immunosuppressive drugs, initial encounter: Secondary | ICD-10-CM

## 2018-09-04 DIAGNOSIS — Z8673 Personal history of transient ischemic attack (TIA), and cerebral infarction without residual deficits: Secondary | ICD-10-CM | POA: Diagnosis not present

## 2018-09-04 DIAGNOSIS — G620 Drug-induced polyneuropathy: Secondary | ICD-10-CM | POA: Diagnosis not present

## 2018-09-04 DIAGNOSIS — R42 Dizziness and giddiness: Secondary | ICD-10-CM | POA: Diagnosis not present

## 2018-09-04 DIAGNOSIS — I480 Paroxysmal atrial fibrillation: Secondary | ICD-10-CM

## 2018-09-04 DIAGNOSIS — I10 Essential (primary) hypertension: Secondary | ICD-10-CM

## 2018-09-04 DIAGNOSIS — Z85828 Personal history of other malignant neoplasm of skin: Secondary | ICD-10-CM

## 2018-09-04 DIAGNOSIS — D6481 Anemia due to antineoplastic chemotherapy: Secondary | ICD-10-CM

## 2018-09-04 DIAGNOSIS — B370 Candidal stomatitis: Secondary | ICD-10-CM

## 2018-09-04 DIAGNOSIS — Z7901 Long term (current) use of anticoagulants: Secondary | ICD-10-CM

## 2018-09-04 LAB — C DIFFICILE QUICK SCREEN W PCR REFLEX
C DIFFICILE (CDIFF) TOXIN: NEGATIVE
C Diff antigen: NEGATIVE
C Diff interpretation: NOT DETECTED

## 2018-09-04 MED ORDER — NYSTATIN 100000 UNIT/ML MT SUSP: 5.0000 mL | Freq: Four times a day (QID) | OROMUCOSAL | 0 refills | Status: DC

## 2018-09-04 MED ORDER — LACTINEX PO CHEW: 1.0000 | CHEWABLE_TABLET | Freq: Three times a day (TID) | ORAL | 0 refills | Status: DC

## 2018-09-04 NOTE — Telephone Encounter (Signed)
Priinted avbs and

## 2018-09-04 NOTE — Telephone Encounter (Signed)
Printed avs and calender of upcoming appointment. Per 49/13 los

## 2018-09-04 NOTE — Patient Instructions (Signed)
-  Hold your Ninlaro, revlimid and Dexamethasone for now with immediate effect -take over the counter Probiotics and cultured yogurt -Pickup a stool sample container from the lab today -Prescription for thrush sent to your pharmacy -continue B complex 1 cap po daily and B12 1000 microgram po daily

## 2018-09-07 ENCOUNTER — Other Ambulatory Visit: Payer: Self-pay | Admitting: Hematology

## 2018-09-07 ENCOUNTER — Telehealth: Payer: Self-pay

## 2018-09-07 LAB — GASTROINTESTINAL PANEL BY PCR, STOOL (REPLACES STOOL CULTURE)
ADENOVIRUS F40/41: NOT DETECTED
ASTROVIRUS: NOT DETECTED
CYCLOSPORA CAYETANENSIS: NOT DETECTED
Campylobacter species: NOT DETECTED
Cryptosporidium: NOT DETECTED
ENTAMOEBA HISTOLYTICA: NOT DETECTED
ENTEROPATHOGENIC E COLI (EPEC): DETECTED — AB
ENTEROTOXIGENIC E COLI (ETEC): NOT DETECTED
Enteroaggregative E coli (EAEC): NOT DETECTED
Giardia lamblia: NOT DETECTED
Norovirus GI/GII: NOT DETECTED
Plesimonas shigelloides: NOT DETECTED
Rotavirus A: NOT DETECTED
Salmonella species: NOT DETECTED
Sapovirus (I, II, IV, and V): NOT DETECTED
Shiga like toxin producing E coli (STEC): NOT DETECTED
Shigella/Enteroinvasive E coli (EIEC): NOT DETECTED
VIBRIO CHOLERAE: NOT DETECTED
VIBRIO SPECIES: NOT DETECTED
Yersinia enterocolitica: NOT DETECTED

## 2018-09-07 MED ORDER — LEVOFLOXACIN 500 MG PO TABS: 500.0000 mg | ORAL_TABLET | Freq: Every day | ORAL | 0 refills | Status: DC

## 2018-09-07 NOTE — Telephone Encounter (Signed)
Called patient to let him know that per Dr. Irene Limbo he has E. COli gastroenteritis and a prescription has been sent to the pharmacy for Levofloxacin. Instructed patient that per Dr. Irene Limbo he should continue OTC probiotics while on antibiotics and for 10 days afterwards. Patient verbalized understanding.

## 2018-09-07 NOTE — Telephone Encounter (Signed)
-----   Message from Brunetta Genera, MD sent at 09/07/2018 12:40 PM EDT ----- Lanelle Bal, Please let Mr Milly Jakob know that he had a E.COli gastroenteritis (not sure about which food source it could have been). A prescription for Levolfoxacin has been sent to his pharmacy. Have him continue his OTC probiotics which on antibiotics and for 10 days afterwards. Thanks Williamson

## 2018-09-07 NOTE — Telephone Encounter (Signed)
Dr. Irene Limbo made aware of negative C diff results and that enteropathogenic E. Coli was detected in the GI panel. No new orders received at this time.

## 2018-09-10 LAB — ACID FAST CULTURE WITH REFLEXED SENSITIVITIES (MYCOBACTERIA): Acid Fast Culture: NEGATIVE

## 2018-09-14 ENCOUNTER — Ambulatory Visit (INDEPENDENT_AMBULATORY_CARE_PROVIDER_SITE_OTHER): Payer: Medicare Other | Admitting: *Deleted

## 2018-09-14 DIAGNOSIS — I639 Cerebral infarction, unspecified: Secondary | ICD-10-CM

## 2018-09-15 LAB — CUP PACEART REMOTE DEVICE CHECK
Date Time Interrogation Session: 20190821184052
MDC IDC PG IMPLANT DT: 20170613

## 2018-09-15 NOTE — Progress Notes (Signed)
Carelink Summary Report / Loop Recorder 

## 2018-09-18 ENCOUNTER — Other Ambulatory Visit: Payer: Self-pay

## 2018-09-18 DIAGNOSIS — C9000 Multiple myeloma not having achieved remission: Secondary | ICD-10-CM

## 2018-09-18 MED ORDER — LENALIDOMIDE 15 MG PO CAPS: 15.0000 mg | ORAL_CAPSULE | Freq: Every day | ORAL | 0 refills | Status: DC

## 2018-09-21 LAB — CUP PACEART REMOTE DEVICE CHECK
Date Time Interrogation Session: 20190923183738
MDC IDC PG IMPLANT DT: 20170613

## 2018-09-24 ENCOUNTER — Other Ambulatory Visit: Payer: Self-pay

## 2018-09-24 DIAGNOSIS — C9000 Multiple myeloma not having achieved remission: Secondary | ICD-10-CM

## 2018-09-25 ENCOUNTER — Inpatient Hospital Stay: Payer: Medicare Other | Attending: Hematology

## 2018-09-25 DIAGNOSIS — I10 Essential (primary) hypertension: Secondary | ICD-10-CM | POA: Diagnosis not present

## 2018-09-25 DIAGNOSIS — D6959 Other secondary thrombocytopenia: Secondary | ICD-10-CM | POA: Diagnosis not present

## 2018-09-25 DIAGNOSIS — I480 Paroxysmal atrial fibrillation: Secondary | ICD-10-CM | POA: Diagnosis not present

## 2018-09-25 DIAGNOSIS — T451X5A Adverse effect of antineoplastic and immunosuppressive drugs, initial encounter: Secondary | ICD-10-CM | POA: Insufficient documentation

## 2018-09-25 DIAGNOSIS — Z8673 Personal history of transient ischemic attack (TIA), and cerebral infarction without residual deficits: Secondary | ICD-10-CM | POA: Diagnosis not present

## 2018-09-25 DIAGNOSIS — D6481 Anemia due to antineoplastic chemotherapy: Secondary | ICD-10-CM | POA: Diagnosis not present

## 2018-09-25 DIAGNOSIS — G620 Drug-induced polyneuropathy: Secondary | ICD-10-CM | POA: Insufficient documentation

## 2018-09-25 DIAGNOSIS — E119 Type 2 diabetes mellitus without complications: Secondary | ICD-10-CM | POA: Insufficient documentation

## 2018-09-25 DIAGNOSIS — C44622 Squamous cell carcinoma of skin of right upper limb, including shoulder: Secondary | ICD-10-CM | POA: Insufficient documentation

## 2018-09-25 DIAGNOSIS — C9000 Multiple myeloma not having achieved remission: Secondary | ICD-10-CM | POA: Insufficient documentation

## 2018-09-25 DIAGNOSIS — E538 Deficiency of other specified B group vitamins: Secondary | ICD-10-CM | POA: Diagnosis not present

## 2018-09-25 DIAGNOSIS — Z7901 Long term (current) use of anticoagulants: Secondary | ICD-10-CM | POA: Insufficient documentation

## 2018-09-25 LAB — CMP (CANCER CENTER ONLY)
ALK PHOS: 63 U/L (ref 38–126)
ALT: 9 U/L (ref 0–44)
ANION GAP: 9 (ref 5–15)
AST: 20 U/L (ref 15–41)
Albumin: 3.8 g/dL (ref 3.5–5.0)
BILIRUBIN TOTAL: 0.4 mg/dL (ref 0.3–1.2)
BUN: 27 mg/dL — ABNORMAL HIGH (ref 8–23)
CALCIUM: 9.8 mg/dL (ref 8.9–10.3)
CO2: 27 mmol/L (ref 22–32)
CREATININE: 1.13 mg/dL (ref 0.61–1.24)
Chloride: 106 mmol/L (ref 98–111)
GFR, Estimated: >60 mL/min (ref >60)
Glucose, Bld: 180 mg/dL — ABNORMAL HIGH (ref 70–99)
Potassium: 4.1 mmol/L (ref 3.5–5.1)
Sodium: 142 mmol/L (ref 135–145)
TOTAL PROTEIN: 6.6 g/dL (ref 6.5–8.1)

## 2018-09-25 LAB — CBC WITH DIFFERENTIAL (CANCER CENTER ONLY)
BASOS ABS: 0 10*3/uL (ref 0.0–0.1)
BASOS PCT: 2 %
EOS ABS: 0 10*3/uL (ref 0.0–0.5)
Eosinophils Relative: 2 %
HEMATOCRIT: 33.9 % — AB (ref 38.4–49.9)
Hemoglobin: 11.3 g/dL — ABNORMAL LOW (ref 13.0–17.1)
Lymphocytes Relative: 16 %
Lymphs Abs: 0.5 10*3/uL — ABNORMAL LOW (ref 0.9–3.3)
MCH: 32 pg (ref 27.2–33.4)
MCHC: 33.3 g/dL (ref 32.0–36.0)
MCV: 96.2 fL (ref 79.3–98.0)
MONO ABS: 0.3 10*3/uL (ref 0.1–0.9)
Monocytes Relative: 11 %
NEUTROS ABS: 2 10*3/uL (ref 1.5–6.5)
Neutrophils Relative %: 69 %
Platelet Count: 159 10*3/uL (ref 140–400)
RBC: 3.53 MIL/uL — ABNORMAL LOW (ref 4.20–5.82)
RDW: 16.9 % — AB (ref 11.0–14.6)
WBC Count: 2.9 10*3/uL — ABNORMAL LOW (ref 4.0–10.3)

## 2018-09-27 ENCOUNTER — Other Ambulatory Visit: Payer: Self-pay

## 2018-09-27 ENCOUNTER — Observation Stay (HOSPITAL_COMMUNITY)
Admission: EM | Admit: 2018-09-27 | Discharge: 2018-09-28 | Disposition: A | Discharge status: Home / Self Care | Payer: Medicare Other | Attending: Internal Medicine | Admitting: Internal Medicine

## 2018-09-27 ENCOUNTER — Emergency Department (HOSPITAL_COMMUNITY): Payer: Medicare Other

## 2018-09-27 ENCOUNTER — Encounter (HOSPITAL_COMMUNITY): Payer: Self-pay

## 2018-09-27 DIAGNOSIS — E785 Hyperlipidemia, unspecified: Secondary | ICD-10-CM | POA: Diagnosis present

## 2018-09-27 DIAGNOSIS — K219 Gastro-esophageal reflux disease without esophagitis: Secondary | ICD-10-CM | POA: Diagnosis not present

## 2018-09-27 DIAGNOSIS — Z8673 Personal history of transient ischemic attack (TIA), and cerebral infarction without residual deficits: Secondary | ICD-10-CM | POA: Insufficient documentation

## 2018-09-27 DIAGNOSIS — I2510 Atherosclerotic heart disease of native coronary artery without angina pectoris: Secondary | ICD-10-CM | POA: Diagnosis not present

## 2018-09-27 DIAGNOSIS — E119 Type 2 diabetes mellitus without complications: Secondary | ICD-10-CM | POA: Diagnosis not present

## 2018-09-27 DIAGNOSIS — I471 Supraventricular tachycardia, unspecified: Secondary | ICD-10-CM | POA: Diagnosis present

## 2018-09-27 DIAGNOSIS — I10 Essential (primary) hypertension: Secondary | ICD-10-CM | POA: Diagnosis not present

## 2018-09-27 DIAGNOSIS — E118 Type 2 diabetes mellitus with unspecified complications: Secondary | ICD-10-CM | POA: Diagnosis not present

## 2018-09-27 DIAGNOSIS — I480 Paroxysmal atrial fibrillation: Secondary | ICD-10-CM | POA: Insufficient documentation

## 2018-09-27 DIAGNOSIS — I639 Cerebral infarction, unspecified: Secondary | ICD-10-CM | POA: Diagnosis present

## 2018-09-27 DIAGNOSIS — R079 Chest pain, unspecified: Secondary | ICD-10-CM

## 2018-09-27 DIAGNOSIS — M109 Gout, unspecified: Secondary | ICD-10-CM | POA: Diagnosis present

## 2018-09-27 DIAGNOSIS — R072 Precordial pain: Secondary | ICD-10-CM | POA: Diagnosis not present

## 2018-09-27 DIAGNOSIS — Z7984 Long term (current) use of oral hypoglycemic drugs: Secondary | ICD-10-CM | POA: Insufficient documentation

## 2018-09-27 DIAGNOSIS — R200 Anesthesia of skin: Secondary | ICD-10-CM | POA: Diagnosis present

## 2018-09-27 DIAGNOSIS — Z79899 Other long term (current) drug therapy: Secondary | ICD-10-CM | POA: Insufficient documentation

## 2018-09-27 DIAGNOSIS — Z7901 Long term (current) use of anticoagulants: Secondary | ICD-10-CM | POA: Diagnosis not present

## 2018-09-27 DIAGNOSIS — C9000 Multiple myeloma not having achieved remission: Secondary | ICD-10-CM

## 2018-09-27 DIAGNOSIS — R509 Fever, unspecified: Secondary | ICD-10-CM | POA: Diagnosis not present

## 2018-09-27 DIAGNOSIS — R0789 Other chest pain: Secondary | ICD-10-CM | POA: Diagnosis not present

## 2018-09-27 DIAGNOSIS — I499 Cardiac arrhythmia, unspecified: Secondary | ICD-10-CM | POA: Diagnosis not present

## 2018-09-27 HISTORY — DX: Multiple myeloma not having achieved remission: C90.00

## 2018-09-27 HISTORY — DX: Chest pain, unspecified: R07.9

## 2018-09-27 LAB — CBC WITH DIFFERENTIAL/PLATELET
Abs Immature Granulocytes: 0 10*3/uL (ref 0.0–0.1)
Basophils Absolute: 0 10*3/uL (ref 0.0–0.1)
Basophils Relative: 1 %
EOS ABS: 0 10*3/uL (ref 0.0–0.7)
EOS PCT: 1 %
HEMATOCRIT: 38.8 % — AB (ref 39.0–52.0)
Hemoglobin: 12.4 g/dL — ABNORMAL LOW (ref 13.0–17.0)
Immature Granulocytes: 0 %
LYMPHS ABS: 0.6 10*3/uL — AB (ref 0.7–4.0)
Lymphocytes Relative: 19 %
MCH: 31.1 pg (ref 26.0–34.0)
MCHC: 32 g/dL (ref 30.0–36.0)
MCV: 97.2 fL (ref 78.0–100.0)
Monocytes Absolute: 0.4 10*3/uL (ref 0.1–1.0)
Monocytes Relative: 11 %
Neutro Abs: 2.2 10*3/uL (ref 1.7–7.7)
Neutrophils Relative %: 68 %
Platelets: 164 10*3/uL (ref 150–400)
RBC: 3.99 MIL/uL — AB (ref 4.22–5.81)
RDW: 14.6 % (ref 11.5–15.5)
WBC: 3.3 10*3/uL — AB (ref 4.0–10.5)

## 2018-09-27 LAB — BASIC METABOLIC PANEL
Anion gap: 10 (ref 5–15)
BUN: 19 mg/dL (ref 8–23)
CHLORIDE: 104 mmol/L (ref 98–111)
CO2: 24 mmol/L (ref 22–32)
Calcium: 10.1 mg/dL (ref 8.9–10.3)
Creatinine, Ser: 0.96 mg/dL (ref 0.61–1.24)
GFR calc Af Amer: >60 mL/min (ref >60)
GFR calc non Af Amer: >60 mL/min (ref >60)
Glucose, Bld: 140 mg/dL — ABNORMAL HIGH (ref 70–99)
Potassium: 3.7 mmol/L (ref 3.5–5.1)
SODIUM: 138 mmol/L (ref 135–145)

## 2018-09-27 LAB — I-STAT TROPONIN, ED
Troponin i, poc: 0 ng/mL (ref 0.00–0.08)
Troponin i, poc: 0 ng/mL (ref 0.00–0.08)

## 2018-09-27 LAB — D-DIMER, QUANTITATIVE: D-Dimer, Quant: <0.27 ug/mL-FEU (ref 0.00–0.50)

## 2018-09-27 MED ORDER — ALLOPURINOL 300 MG PO TABS
300.0000 mg | ORAL_TABLET | Freq: Every day | ORAL | Status: DC
  Administered 2018-09-27 – 2018-09-28 (×2): 300 mg via ORAL
  Filled 2018-09-27 (×2): qty 1

## 2018-09-27 MED ORDER — HYDROCODONE-ACETAMINOPHEN 5-325 MG PO TABS: 1.0000 | ORAL_TABLET | Freq: Four times a day (QID) | ORAL | Status: DC | PRN

## 2018-09-27 MED ORDER — NYSTATIN 100000 UNIT/ML MT SUSP: 5.0000 mL | Freq: Four times a day (QID) | OROMUCOSAL | Status: DC

## 2018-09-27 MED ORDER — IBUPROFEN 200 MG PO TABS
400.0000 mg | ORAL_TABLET | ORAL | Status: DC | PRN
  Administered 2018-09-27: 400 mg via ORAL
  Filled 2018-09-27: qty 1

## 2018-09-27 MED ORDER — TELMISARTAN-HCTZ 80-12.5 MG PO TABS: 1.0000 | ORAL_TABLET | Freq: Every day | ORAL | Status: DC

## 2018-09-27 MED ORDER — ONDANSETRON HCL 4 MG/2ML IJ SOLN: 4.0000 mg | Freq: Four times a day (QID) | INTRAMUSCULAR | Status: DC | PRN

## 2018-09-27 MED ORDER — ATORVASTATIN CALCIUM 40 MG PO TABS
40.0000 mg | ORAL_TABLET | Freq: Every day | ORAL | Status: DC
  Administered 2018-09-27 – 2018-09-28 (×2): 40 mg via ORAL
  Filled 2018-09-27 (×2): qty 1

## 2018-09-27 MED ORDER — ACETAMINOPHEN 325 MG PO TABS: 650.0000 mg | ORAL_TABLET | ORAL | Status: DC | PRN

## 2018-09-27 MED ORDER — LACTINEX PO CHEW: 1.0000 | CHEWABLE_TABLET | Freq: Three times a day (TID) | ORAL | Status: DC

## 2018-09-27 MED ORDER — RIVAROXABAN 20 MG PO TABS
20.0000 mg | ORAL_TABLET | Freq: Every day | ORAL | Status: DC
  Administered 2018-09-28: 20 mg via ORAL
  Filled 2018-09-27: qty 1

## 2018-09-27 MED ORDER — VITAMIN B-12 1000 MCG PO TABS
1000.0000 ug | ORAL_TABLET | Freq: Every day | ORAL | Status: DC
  Administered 2018-09-28: 1000 ug via ORAL
  Filled 2018-09-27: qty 1

## 2018-09-27 MED ORDER — HYDROCHLOROTHIAZIDE 12.5 MG PO CAPS
12.5000 mg | ORAL_CAPSULE | Freq: Every day | ORAL | Status: DC
  Administered 2018-09-28: 12.5 mg via ORAL
  Filled 2018-09-27: qty 1

## 2018-09-27 MED ORDER — ACYCLOVIR 400 MG PO TABS
400.0000 mg | ORAL_TABLET | Freq: Every day | ORAL | Status: DC
  Administered 2018-09-27 – 2018-09-28 (×2): 400 mg via ORAL
  Filled 2018-09-27 (×3): qty 1

## 2018-09-27 MED ORDER — IRBESARTAN 150 MG PO TABS
300.0000 mg | ORAL_TABLET | Freq: Every day | ORAL | Status: DC
  Administered 2018-09-28: 300 mg via ORAL
  Filled 2018-09-27: qty 2

## 2018-09-27 MED ORDER — IBUPROFEN 200 MG PO TABS: 400.0000 mg | ORAL_TABLET | Freq: Four times a day (QID) | ORAL | Status: DC | PRN

## 2018-09-27 NOTE — H&P (Signed)
History and Physical  Justin [name] RFX:[id number] DOB: 09-18-1941 DOA: 09/27/2018  Referring physician: Dr. Laverta Baltimore PCP: Mayra Neer, MD  Outpatient Specialists: Unknown Patient coming from: Home & is able to ambulate yes  Chief Complaint: Chest pain  HPI: Justin [name] is a 77 y.o. male with past medical history medical history significant for diabetes GERD hypertension CVA, atrial flutter S/P ablation hyperlipidemia multiple myeloma.  Which he stated his oncologist has stopped 2 of his medication cancer medicine to give him a break he is almost completed 6 months of break from the cancer medications presents to the emergency department to evaluate for acute onset chest pain that began about 5 AM on the day of presentation he arrives by EMS who had given him aspirin 324 mg.  Pain is left-sided sharp he stated that he has nitro at home but he could not find his nitro to take but he took his Xarelto his pain has mostly resolved, he still has a little bit of soreness in the left lower chest wall.  He has been compliant with his medication particularly Xarelto ED Course: In the emergency department EKG was done chest x-ray was done troponin first 1 was drawn.  D-dimer was also drawn which was negative  Review of Systems: Cardiovascular: Chest pain Pt denies any no vomiting nausea fever chills or abdominal pain.  Review of systems are otherwise negative   Past Medical History:  Diagnosis Date  . Anemia   . Arthritis   . 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 Diabetes mellitus without complication ([place])   . GERD (gastroesophageal reflux disease)   . Gout   . History of loop recorder   . Hypertension   . Left leg weakness   . Multiple myeloma ([place]) 09/27/2018  . Paroxysmal SVT (supraventricular tachycardia) ([place])    a. s/p RFCA on 05/01/15  . Stroke [facility]) 2017   TIA and mild stroke; denies stroke symptoms since then    Past Surgical History:  Procedure  Laterality Date  . ATRIAL FIBRILLATION ABLATION     Dr. Lovena Le  . BASAL CELL CARCINOMA EXCISION     off of back  . ELECTROPHYSIOLOGIC STUDY N/A 05/01/2015   Procedure: SVT Ablation;  Surgeon: Evans Lance, MD;  Location: [place] CV [facility];  Service: Cardiovascular;  Laterality: N/A;  . EP IMPLANTABLE DEVICE N/A 06/04/2016   Procedure: Loop Recorder Insertion;  Surgeon: Thompson Grayer, MD;  Location: [place] [facility];  Service: Cardiovascular;  Laterality: N/A;  . INGUINAL HERNIA REPAIR Bilateral 06/16/2017   Procedure: LAPAROSCOPIC BILATERAL INGUINAL HERNIA REPAIR;  Surgeon: Clovis Riley, MD;  Location: [place];  Service: General;  Laterality: Bilateral;  . INSERTION OF MESH Bilateral 06/16/2017   Procedure: INSERTION OF MESH;  Surgeon: Clovis Riley, MD;  Location: [place];  Service: General;  Laterality: Bilateral;  . MASS EXCISION Right 07/28/2018   Procedure: EXCISION OF RIGHT FOREARM MASS;  Surgeon: Clovis Riley, MD;  Location: [facility];  Service: General;  Laterality: Right;  . SQUAMOUS CELL CARCINOMA EXCISION    . TEE WITHOUT CARDIOVERSION N/A 06/04/2016   Procedure: TRANSESOPHAGEAL ECHOCARDIOGRAM (TEE);  Surgeon: Pixie Casino, MD;  Location: [facility] ENDOSCOPY;  Service: Cardiovascular;  Laterality: N/A;    Social History:  reports that he has never smoked. He has never used smokeless tobacco. He reports that he does not drink alcohol or use drugs.   Allergies  Allergen Reactions  .  No Known Allergies     Family History  Problem Relation Age of Onset  . Stroke Mother   . Hypertension Mother   . Alzheimer's disease Father   . Heart failure Father   . Down syndrome Daughter       Prior to Admission medications   Medication Sig Start Date End Date Taking? Authorizing Provider  acetaminophen (TYLENOL) 500 MG tablet Take 500 mg by mouth every 6 (six) hours as needed.   Yes [provider]  acyclovir (ZOVIRAX) 400 MG tablet Take 1 tablet (400 mg total) by mouth  daily. 04/30/18  Yes Brunetta Genera, MD  allopurinol (ZYLOPRIM) 300 MG tablet Take 300 mg by mouth daily.    Yes [provider]  atorvastatin (LIPITOR) 40 MG tablet TAKE 1 TABLET EVERY MORNING Patient taking differently: Take 40 mg by mouth daily.  08/18/18  Yes Hilty, Nadean Corwin, MD  cyanocobalamin 1000 MCG tablet Take 1,000 mcg by mouth daily.   Yes [provider]  fluorouracil (EFUDEX) 5 % cream Apply 1 application topically daily as needed (for skin cancer prevention).  12/28/14  Yes [provider]  ibuprofen (ADVIL) 200 MG tablet Take 200 mg by mouth as needed.   Yes [provider]  lactobacillus acidophilus & bulgar (LACTINEX) chewable tablet Chew 1 tablet by mouth 3 (three) times daily with meals. Patient taking differently: Chew 1 tablet by mouth as needed.  09/04/18  Yes Brunetta Genera, MD  metFORMIN (GLUCOPHAGE) 1000 MG tablet Take 1,000 mg by mouth daily with breakfast.   Yes [provider]  nitroGLYCERIN (NITROSTAT) 0.4 MG SL tablet Place 1 tablet (0.4 mg total) under the tongue every 5 (five) minutes as needed for chest pain. 01/31/15  Yes Hilty, Nadean Corwin, MD  omeprazole (PRILOSEC) 20 MG capsule Take 20 mg by mouth every morning.    Yes [provider]  rivaroxaban (XARELTO) 20 MG TABS tablet Take 1 tablet (20 mg total) by mouth daily with supper. 03/04/18  Yes Baldwin Jamaica, PA-C  telmisartan-hydrochlorothiazide (MICARDIS HCT) 80-12.5 MG tablet Take 1 tablet by mouth daily. 02/21/17  Yes Meng, Isaac Laud, PA  dexamethasone (DECADRON) 4 MG tablet TAKE 5 TABLETS ('20MG'$ ) ONCE WEEKLY ON DAYS 1, 8, 15 AND 21 OF EVERY 28 DAY CYCLE Patient taking differently: Take 20 mg by mouth once a week.  07/26/18   Brunetta Genera, MD  glucose monitoring kit (FREESTYLE) monitoring kit 1 each by Does not apply route as needed for other.    [provider]  HYDROcodone-acetaminophen (NORCO/VICODIN) 5-325 MG tablet Take 1 tablet by mouth  every 6 (six) hours as needed for moderate pain. Patient not taking: Reported on 09/27/2018 07/28/18   Clovis Riley, MD  lenalidomide (REVLIMID) 15 MG capsule Take 1 capsule (15 mg total) by mouth daily. Take for 21 days on, 7 days off, repeat every 28 days. Authorization #[id number] 09/18/2018 Patient taking differently: Take 15 mg by mouth See admin instructions. Take for 21 days on, 7 days off, repeat every 28 days. Authorization #[id number] 09/18/2018 09/18/18   Brunetta Genera, MD  levofloxacin (LEVAQUIN) 500 MG tablet Take 1 tablet (500 mg total) by mouth daily. Patient not taking: Reported on 09/27/2018 09/07/18   Brunetta Genera, MD  NINLARO 4 MG capsule TAKE 1 CAPSULE ('4MG'$ ) BY MOUTH ONCE WEEKLY ON DAYS 1, 8, AND 15, OF EACH 28 DAY CYCLE. TAKE ON AN EMPTY STOMACH 1HR BEFORE OR 2HRS AFTER FOOD 06/23/18   Irene Limbo,  Cloria Spring, MD  nystatin (MYCOSTATIN) 100000 UNIT/ML suspension Take 5 mLs (500,000 Units total) by mouth 4 (four) times daily. Patient not taking: Reported on 09/27/2018 09/04/18   Brunetta Genera, MD    Physical Exam: BP 106/89   Pulse 79   Temp 98.1 F (36.7 C) (Oral)   Resp 17   Ht _0  (1.727 m)   Wt 74.8 kg   SpO2 98%   BMI 25.09 kg/m   General: Healthy-appearing no distress Eyes: Conjunctiva clear ENT: Nasal clear Neck: Supple Cardiovascular: Heart rate regular rate and rhythm no murmur good peripheral circulation Respiratory: Normal work of breathing no rhonchi no rales no wheezing Abdomen: Soft nontender normoactive bowel sounds nondistended Skin: Warm and dry Musculoskeletal: Ambulatory moves all 4 extremities Psychiatric: Appropriate mood and affect Neurologic: Speech is clear alert oriented x3          Labs on Admission:  Basic Metabolic Panel: Recent Labs  Lab 09/25/18 1110 09/27/18 0818  NA 142 138  K 4.1 3.7  CL 106 104  CO2 27 24  GLUCOSE 180* 140*  BUN 27* 19  CREATININE 1.13 0.96  CALCIUM 9.8 10.1   Liver Function  Tests: Recent Labs  Lab 09/25/18 1110  AST 20  ALT 9  ALKPHOS 63  BILITOT 0.4  PROT 6.6  ALBUMIN 3.8   No results for input(s): LIPASE, AMYLASE in the last 168 hours. No results for input(s): AMMONIA in the last 168 hours. CBC: Recent Labs  Lab 09/25/18 1110 09/27/18 0818  WBC 2.9* 3.3*  NEUTROABS 2.0 2.2  HGB 11.3* 12.4*  HCT 33.9* 38.8*  MCV 96.2 97.2  PLT 159 164   Cardiac Enzymes: No results for input(s): CKTOTAL, CKMB, CKMBINDEX, TROPONINI in the last 168 hours.  BNP (last 3 results) No results for input(s): BNP in the last 8760 hours.  ProBNP (last 3 results) No results for input(s): PROBNP in the last 8760 hours.  CBG: No results for input(s): GLUCAP in the last 168 hours.  Radiological Exams on Admission: Dg Chest 2 View  Result Date: 09/27/2018 CLINICAL DATA:  Left side chest pain EXAM: CHEST - 2 VIEW COMPARISON:  06/16/2017 FINDINGS: Heart and mediastinal contours are within normal limits. No focal opacities or effusions. No acute bony abnormality. IMPRESSION: No active cardiopulmonary disease. Electronically Signed   By: Rolm Baptise M.D.   On: 09/27/2018 08:41    EKG: I Per EDMD there is no STEMI  Assessment/Plan Present on Admission: . Chest pain . Controlled type 2 diabetes mellitus with complication, without long-term current use of insulin ([place]) . Hyperlipidemia . Hypertension . GERD (gastroesophageal reflux disease) . Gout . Atherosclerosis of native coronary artery of native heart without angina pectoris . Numbness . CVA (cerebral vascular accident) ([place]) . Paroxysmal SVT (supraventricular tachycardia) ([place]) . Multiple myeloma ([place])  1.  Chest pain likely noncardiac.  His troponin are negative chest x-ray negative a d-dimer was also done to rule up embolism and that was negative.  2.Multiple myeloma likely in remission he will continue follow-up with oncologist which is coming up soon.  3.  Hypertension continue current medications from  home  4.  Type 2 diabetes mellitus fairly controlled continue current medication  5.  GERD continue home Protonix  6.  History of CVA and residual left lower extremity weakness continue Xarelto  7.  Atrial fibrillation rate is controlled continue on Xarelto    Principal Problem:   Chest pain Active Problems:   Controlled type 2  diabetes mellitus with complication, without long-term current use of insulin (HCC)   Hyperlipidemia   Hypertension   GERD (gastroesophageal reflux disease)   Gout   Atherosclerosis of native coronary artery of native heart without angina pectoris   Numbness   CVA (cerebral vascular accident) ([place])   Paroxysmal SVT (supraventricular tachycardia) ([place])   Multiple myeloma ([place])   DVT prophylaxis: Xarelto  Code Status: Full code  Family Communication: Wife at bedside  Disposition Plan: Home  Consults called: None  Admission status: 24-hour observation    Cristal Deer MD [facility] Pager [phone]  If 7PM-7AM, please contact night-coverage [email or web address] Password [email or web address]  09/27/2018, 2:30 PM

## 2018-09-27 NOTE — ED Notes (Signed)
Pt states pain free but states c/o tinglingat feet. bilsateral pulses noted as 2

## 2018-09-27 NOTE — ED Notes (Signed)
Hospitalist at bedside 

## 2018-09-27 NOTE — ED Provider Notes (Signed)
Emergency Department Provider Note   I have reviewed the triage vital signs and the nursing notes.   HISTORY  Chief Complaint No chief complaint on file.   HPI Justin [name] is a 77 y.o. male with PMH of DM, GERD, HTN, CVA, A-flutter s/p ablation, HLD, and PFO presents to the emergency department for evaluation of acute onset chest pain began at 5 AM this morning.  The patient arrived by EMS and was given 324 mg ASA PTA. Patient could not find Nitro this AM so took Xarelto instead.  States that the pain has mostly resolved at this point.  No worsening pain with deep breathing or movement.  Denies any fevers or chills.  He has been compliant with his Xarelto.  No other modifying factors.    Past Medical History:  Diagnosis Date  . Anemia   . Arthritis   . 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 Diabetes mellitus without complication ([place])   . GERD (gastroesophageal reflux disease)   . Gout   . History of loop recorder   . Hypertension   . Left leg weakness   . Multiple myeloma ([place]) 09/27/2018  . Paroxysmal SVT (supraventricular tachycardia) ([place])    a. s/p RFCA on 05/01/15  . Stroke [facility]) 2017   TIA and mild stroke; denies stroke symptoms since then     Patient Active Problem List   Diagnosis Date Noted  . Chest pain 09/27/2018  . Multiple myeloma ([place]) 09/27/2018  . History of loop recorder 06/12/2017  . 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 Gait abnormality   . History of CVA (cerebrovascular accident)   . History of TIA (transient ischemic attack)   . Benign essential HTN   . Paroxysmal SVT (supraventricular tachycardia) ([place])   . Acute blood loss anemia   . Acute ischemic stroke ([place])   . CVA (cerebral vascular accident) ([place]) 10/26/2016  . History of recent stroke 06/06/2016  . 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 TIA (transient ischemic attack) 06/02/2016  . Numbness 06/01/2016  . Right arm numbness 06/01/2016  .  Atherosclerosis of native coronary artery of native heart without angina pectoris 03/20/2016  . Hypertension   . GERD (gastroesophageal reflux disease)   . Gout   . S/P RF ablation operation for arrhythmia 12/13/2014  . Hyperlipidemia 12/13/2014  . Controlled type 2 diabetes mellitus with complication, without Cimberly Stoffel-term current use of insulin ([place]) 12/07/2014    Past Surgical History:  Procedure Laterality Date  . ATRIAL FIBRILLATION ABLATION     Dr. Lovena Le  . BASAL CELL CARCINOMA EXCISION     off of back  . ELECTROPHYSIOLOGIC STUDY N/A 05/01/2015   Procedure: SVT Ablation;  Surgeon: Evans Lance, MD;  Location: [place] [facility];  Service: Cardiovascular;  Laterality: N/A;  . EP IMPLANTABLE DEVICE N/A 06/04/2016   Procedure: Loop Recorder Insertion;  Surgeon: Thompson Grayer, MD;  Location: [place] [facility];  Service: Cardiovascular;  Laterality: N/A;  . INGUINAL HERNIA REPAIR Bilateral 06/16/2017   Procedure: LAPAROSCOPIC BILATERAL INGUINAL HERNIA REPAIR;  Surgeon: Clovis Riley, MD;  Location: [place];  Service: General;  Laterality: Bilateral;  . INSERTION OF MESH Bilateral 06/16/2017   Procedure: INSERTION OF MESH;  Surgeon: Clovis Riley, MD;  Location: [place];  Service: General;  Laterality: Bilateral;  . MASS EXCISION Right 07/28/2018   Procedure: EXCISION OF RIGHT FOREARM MASS;  Surgeon: Clovis Riley, MD;  Location: [facility];  Service: General;  Laterality: Right;  . SQUAMOUS CELL CARCINOMA EXCISION    . TEE WITHOUT CARDIOVERSION N/A 06/04/2016   Procedure: TRANSESOPHAGEAL ECHOCARDIOGRAM (TEE);  Surgeon: Pixie Casino, MD;  Location: [facility] ENDOSCOPY;  Service: Cardiovascular;  Laterality: N/A;    Allergies No known allergies  Family History  Problem Relation Age of Onset  . Stroke Mother   . Hypertension Mother   . Alzheimer's disease Father   . Heart failure Father   . Down syndrome Daughter     Social History Social History   Tobacco Use  . Smoking status:  Never Smoker  . Smokeless tobacco: Never Used  Substance Use Topics  . Alcohol use: No  . Drug use: No    Review of Systems  Constitutional: No fever/chills Eyes: No visual changes. ENT: No sore throat. Cardiovascular: Positive chest pain. Respiratory: Denies shortness of breath. Gastrointestinal: No abdominal pain.  No nausea, no vomiting.  No diarrhea.  No constipation. Genitourinary: Negative for dysuria. Musculoskeletal: Negative for back pain. Skin: Negative for rash. Neurological: Negative for headaches, focal weakness or numbness.  10-point ROS otherwise negative.  ____________________________________________   PHYSICAL EXAM:  VITAL SIGNS: ED Triage Vitals  Enc Vitals Group     BP 09/27/18 0737 (!) 157/82     Pulse Rate 09/27/18 0737 89     Resp 09/27/18 0737 16     Temp 09/27/18 0737 98.1 F (36.7 C)     Temp Source 09/27/18 0737 Oral     SpO2 09/27/18 0736 99 %     Weight 09/27/18 0737 165 lb (74.8 kg)     Height 09/27/18 0737 5' 8"  (1.727 m)     Pain Score 09/27/18 0740 4   Constitutional: Alert and oriented. Well appearing and in no acute distress. Eyes: Conjunctivae are normal.  Head: Atraumatic. Nose: No congestion/rhinnorhea. Mouth/Throat: Mucous membranes are moist.  Neck: No stridor.  Cardiovascular: Normal rate, regular rhythm. Good peripheral circulation. Grossly normal heart sounds.   Respiratory: Normal respiratory effort.  No retractions. Lungs CTAB. Gastrointestinal: Soft and nontender. No distention.  Musculoskeletal: No lower extremity tenderness nor edema. No gross deformities of extremities. Neurologic:  Normal speech and language. No gross focal neurologic deficits are appreciated.  Skin:  Skin is warm, dry and intact. No rash noted.  ____________________________________________   LABS (all labs ordered are listed, but only abnormal results are displayed)  Labs Reviewed  BASIC METABOLIC PANEL - Abnormal; Notable for the following  components:      Result Value   Glucose, Bld 140 (*)    All other components within normal limits  CBC WITH DIFFERENTIAL/PLATELET - Abnormal; Notable for the following components:   WBC 3.3 (*)    RBC 3.99 (*)    Hemoglobin 12.4 (*)    HCT 38.8 (*)    Lymphs Abs 0.6 (*)    All other components within normal limits  D-DIMER, QUANTITATIVE (NOT AT [facility])  I-STAT TROPONIN, ED  I-STAT TROPONIN, ED   ____________________________________________  EKG   EKG Interpretation  Date/Time:  Sunday September 27 2018 07:35:43 EDT Ventricular Rate:  91 PR Interval:    QRS Duration: 77 QT Interval:  454 QTC Calculation: 514 R Axis:   -91 Text Interpretation:  Sinus rhythm Ventricular premature complex Short PR interval Low voltage, extremity leads Borderline repolarization abnormality Prolonged QT interval No STEMI.  Confirmed by Nanda Quinton [phone]) on 09/27/2018 7:43:34 AM       ____________________________________________  RADIOLOGY  Dg Chest 2 View  Result Date: 09/27/2018 CLINICAL DATA:  Left side chest pain EXAM: CHEST - 2 VIEW COMPARISON:  06/16/2017 FINDINGS: Heart and mediastinal contours are within normal limits. No focal opacities or effusions. No acute bony abnormality. IMPRESSION: No active cardiopulmonary disease. Electronically Signed   By: Rolm Baptise M.D.   On: 09/27/2018 08:41    ____________________________________________   PROCEDURES  Procedure(s) performed:   Procedures  None ____________________________________________   INITIAL IMPRESSION / ASSESSMENT AND PLAN / ED COURSE  Pertinent labs & imaging results that were available during my care of the patient were reviewed by me and considered in my medical decision making (see chart for details).  Patient presents to the emergency department for evaluation of sudden onset chest pain which began at 5 AM.  Pain is sharp and focal.  Lower suspicion for ACS although the patient does have several risk factors.   Considering PE, however, the patient is compliant with Xarelto making this less likely.  He does have increased risk of forming clots with his multiple myeloma.  Pain is mostly resolved at this point.  Plan for labs, d-dimer, chest x-ray, reassess.   Labs including d-dimer reviewed with no acute findings.  Chest x-ray negative.  Patient has significant CAD risk factors.  Given this, plan for admission for chest pain evaluation.   Discussed patient's case with Hospitalist to request admission. Patient and family (if present) updated with plan. Care transferred to Hospitalist service.  I reviewed all nursing notes, vitals, pertinent old records, EKGs, labs, imaging (as available).  ____________________________________________  FINAL CLINICAL IMPRESSION(S) / ED DIAGNOSES  Final diagnoses:  Precordial chest pain    MEDICATIONS GIVEN DURING THIS VISIT:  Medications  ibuprofen (ADVIL,MOTRIN) tablet 400 mg (400 mg Oral Given 09/27/18 1549)    Note:  This document was prepared using Dragon voice recognition software and may include unintentional dictation errors.  Nanda Quinton, MD Emergency Medicine    Ciara Kagan, [facility], MD 09/27/18 1700

## 2018-09-27 NOTE — ED Triage Notes (Addendum)
Pt arrives EMS from home with c/o chest pain 7/10 this am around 5 am. Pt had hx of cva, afib, HTN and multiple myeloma. Pt state he was unable to find his nitro so took his xeralto. Given 324 ASA by EMS PTA. Currently denies chest pain. Pt alos c/o pain at bilateral legs that has been ongoing and worsening for 4 weeks since stopping CA meds.

## 2018-09-28 ENCOUNTER — Other Ambulatory Visit: Payer: Self-pay | Admitting: Cardiology

## 2018-09-28 ENCOUNTER — Ambulatory Visit (HOSPITAL_BASED_OUTPATIENT_CLINIC_OR_DEPARTMENT_OTHER): Payer: Medicare Other

## 2018-09-28 DIAGNOSIS — E782 Mixed hyperlipidemia: Secondary | ICD-10-CM

## 2018-09-28 DIAGNOSIS — E118 Type 2 diabetes mellitus with unspecified complications: Secondary | ICD-10-CM | POA: Diagnosis not present

## 2018-09-28 DIAGNOSIS — I5030 Unspecified diastolic (congestive) heart failure: Secondary | ICD-10-CM

## 2018-09-28 DIAGNOSIS — I10 Essential (primary) hypertension: Secondary | ICD-10-CM | POA: Diagnosis not present

## 2018-09-28 DIAGNOSIS — I2510 Atherosclerotic heart disease of native coronary artery without angina pectoris: Secondary | ICD-10-CM

## 2018-09-28 DIAGNOSIS — I471 Supraventricular tachycardia: Secondary | ICD-10-CM

## 2018-09-28 DIAGNOSIS — R079 Chest pain, unspecified: Secondary | ICD-10-CM

## 2018-09-28 DIAGNOSIS — R0789 Other chest pain: Secondary | ICD-10-CM

## 2018-09-28 DIAGNOSIS — K219 Gastro-esophageal reflux disease without esophagitis: Secondary | ICD-10-CM | POA: Diagnosis not present

## 2018-09-28 LAB — MULTIPLE MYELOMA PANEL, SERUM
Albumin SerPl Elph-Mcnc: 3.6 g/dL (ref 2.9–4.4)
Albumin/Glob SerPl: 1.6 (ref 0.7–1.7)
Alpha 1: 0.3 g/dL (ref 0.0–0.4)
Alpha2 Glob SerPl Elph-Mcnc: 0.7 g/dL (ref 0.4–1.0)
B-Globulin SerPl Elph-Mcnc: 0.8 g/dL (ref 0.7–1.3)
Gamma Glob SerPl Elph-Mcnc: 0.6 g/dL (ref 0.4–1.8)
Globulin, Total: 2.4 g/dL (ref 2.2–3.9)
IGG (IMMUNOGLOBIN G), SERUM: 727 mg/dL (ref 700–1600)
IGM (IMMUNOGLOBULIN M), SRM: 14 mg/dL — AB (ref 15–143)
IgA: 22 mg/dL — ABNORMAL LOW (ref 61–437)
M Protein SerPl Elph-Mcnc: 0.3 g/dL — ABNORMAL HIGH
Total Protein ELP: 6 g/dL (ref 6.0–8.5)

## 2018-09-28 LAB — KAPPA/LAMBDA LIGHT CHAINS
KAPPA FREE LGHT CHN: 10.1 mg/L (ref 3.3–19.4)
Kappa, lambda light chain ratio: 0.48 (ref 0.26–1.65)
Lambda free light chains: 21.2 mg/L (ref 5.7–26.3)

## 2018-09-28 LAB — ECHOCARDIOGRAM COMPLETE
Height: 68 in
WEIGHTICAEL: 2606.4 [oz_av]

## 2018-09-28 MED ORDER — NITROGLYCERIN 0.4 MG SL SUBL: 0.4000 mg | SUBLINGUAL_TABLET | SUBLINGUAL | 0 refills | Status: AC | PRN

## 2018-09-28 MED ORDER — LACTINEX PO CHEW: 1.0000 | CHEWABLE_TABLET | ORAL | Status: DC | PRN

## 2018-09-28 NOTE — Care Management Obs Status (Signed)
[place] NOTIFICATION   Patient Details  Name: Alyus Mofield MRN: [id number] Date of Birth: Jan 12, 1941   Medicare Observation Status Notification Given:  Yes    Bethena Roys, RN 09/28/2018, 2:46 PM

## 2018-09-28 NOTE — Consult Note (Signed)
Cardiology Consultation:   Patient ID: Justin [name] MRN: [id number]; DOB: 10-05-1941  Admit date: 09/27/2018 Date of Consult: 09/28/2018  Primary Care Provider: Mayra Neer, MD Primary Cardiologist: Pixie Casino, MD  Primary Electrophysiologist:  Dr. Rayann Heman / Dr. Lovena Le   Patient Profile:   Justin [name] is a 77 y.o. male with a hx of mild-moderate nonobstructive CAD by coronary CTA in 2016, SVT s/p ablation, atrial fibrillation, chronic anticoagulation, h/o CVA, small PFO, HTN, HLD, DM, GERD and multiple myeloma,  who is being seen today for the evaluation of chest pain at the request of Dr. Algis Liming, Internal Medicine.  History of Present Illness:   Justin [name] is a 77 y.o. male with a hx of mild-moderate nonobstructive CAD by coronary CTA in 2016, SVT s/p ablation, atrial fibrillation, chronic anticoagulation, h/o CVA, small PFO, HTN, HLD, DM, GERD and multiple myeloma,  who is being seen today for the evaluation of chest pain at the request of Dr. Algis Liming, Internal Medicine.  Pt is followed by Dr. Debara Pickett and Dr. Rayann Heman. He had a coronary CTA in 2016 that showed mild-moderate CAD (25-50% diagonal branch disease, 0-25% LCx, coronary calcium score was 202). Aggressive risk factor modification was recommended. He has had SVT ablation by Dr. Lovena Le several years ago. He had a CVA and underwent loop recorder implant, which later revealed atrial fibrillation and is now on Xarelto for atrial fibrillation. Most recently, he has been followed by Dr. Rayann Heman in EP clinic. He has multiple myeloma, but mentions that he got a good report at his last f/u. Several of his medications were stopped with plans to monitor. He has f/u pending in several weeks.  He presented to the [facility] ED yesterday with CC of chest pain. CP started Justin Sunday morning around 4AM. He was laying in bed. Had a hard time sleeping that night. Developed new onset left sided CP. Described as combination of sharp and tight pain.  Non radiating. No associated dyspnea, nausea and no diaphoresis. No exacerbating factors. No exertional symptoms. Last meal the night prior consisted of baked fish, rice and vegetables. Did not feel like indigestion but he did try TUMS which helped to ease the discomfort, but not completely. No improvement with ibuprofen. Pain was constant. Lasted several hours. He decided to call EMS around 7:30 AM. They gave him ASA and that seemed to help the most. Pain completely resolved. No recurrence since arriving to [facility]. CP free.   POC Troponin is negative x 2. D-dimer negative. CXR is negative. 2D echo pending.   Past Medical History:  Diagnosis Date  . Anemia   . Arthritis   . 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 Diabetes mellitus without complication ([place])   . GERD (gastroesophageal reflux disease)   . Gout   . History of loop recorder   . Hypertension   . Left leg weakness   . Multiple myeloma ([place]) 09/27/2018  . Paroxysmal SVT (supraventricular tachycardia) ([place])    a. s/p RFCA on 05/01/15  . Stroke [facility]) 2017   TIA and mild stroke; denies stroke symptoms since then     Past Surgical History:  Procedure Laterality Date  . ATRIAL FIBRILLATION ABLATION     Dr. Lovena Le  . BASAL CELL CARCINOMA EXCISION     off of back  . ELECTROPHYSIOLOGIC STUDY N/A 05/01/2015   Procedure: SVT Ablation;  Surgeon: Evans Lance, MD;  Location: [place] [facility];  Service: Cardiovascular;  Laterality: N/A;  . EP IMPLANTABLE DEVICE N/A 06/04/2016   Procedure: Loop Recorder Insertion;  Surgeon: Thompson Grayer, MD;  Location: [place] [facility];  Service: Cardiovascular;  Laterality: N/A;  . INGUINAL HERNIA REPAIR Bilateral 06/16/2017   Procedure: LAPAROSCOPIC BILATERAL INGUINAL HERNIA REPAIR;  Surgeon: Clovis Riley, MD;  Location: [place];  Service: General;  Laterality: Bilateral;  . INSERTION OF MESH Bilateral 06/16/2017   Procedure: INSERTION OF MESH;  Surgeon: Clovis Riley, MD;  Location: [place];  Service: General;  Laterality: Bilateral;  . MASS EXCISION Right 07/28/2018   Procedure: EXCISION OF RIGHT FOREARM MASS;  Surgeon: Clovis Riley, MD;  Location: [facility];  Service: General;  Laterality: Right;  . SQUAMOUS CELL CARCINOMA EXCISION    . TEE WITHOUT CARDIOVERSION N/A 06/04/2016   Procedure: TRANSESOPHAGEAL ECHOCARDIOGRAM (TEE);  Surgeon: Pixie Casino, MD;  Location: [facility] ENDOSCOPY;  Service: Cardiovascular;  Laterality: N/A;     Home Medications:  Prior to Admission medications   Medication Sig Start Date End Date Taking? Authorizing Provider  acetaminophen (TYLENOL) 500 MG tablet Take 500 mg by mouth every 6 (six) hours as needed.   Yes [provider]  acyclovir (ZOVIRAX) 400 MG tablet Take 1 tablet (400 mg total) by mouth daily. 04/30/18  Yes Brunetta Genera, MD  allopurinol (ZYLOPRIM) 300 MG tablet Take 300 mg by mouth daily.    Yes [provider]  atorvastatin (LIPITOR) 40 MG tablet TAKE 1 TABLET EVERY MORNING Patient taking differently: Take 40 mg by mouth daily.  08/18/18  Yes Hilty, Nadean Corwin, MD  cyanocobalamin 1000 MCG tablet Take 1,000 mcg by mouth daily.   Yes [provider]  fluorouracil (EFUDEX) 5 % cream Apply 1 application topically daily as needed (for skin cancer prevention).  12/28/14  Yes [provider]  ibuprofen (ADVIL) 200 MG tablet Take 200 mg by mouth as needed.   Yes [provider]  lactobacillus acidophilus & bulgar (LACTINEX) chewable tablet Chew 1 tablet by mouth 3 (three) times daily with meals. Patient taking differently: Chew 1 tablet by mouth as needed.  09/04/18  Yes Brunetta Genera, MD  metFORMIN (GLUCOPHAGE) 1000 MG tablet Take 1,000 mg by mouth daily with breakfast.   Yes [provider]  nitroGLYCERIN (NITROSTAT) 0.4 MG SL tablet Place 1 tablet (0.4 mg total) under the tongue every 5 (five) minutes as needed for chest pain. 01/31/15  Yes Hilty, Nadean Corwin, MD    omeprazole (PRILOSEC) 20 MG capsule Take 20 mg by mouth every morning.    Yes [provider]  rivaroxaban (XARELTO) 20 MG TABS tablet Take 1 tablet (20 mg total) by mouth daily with supper. 03/04/18  Yes Baldwin Jamaica, PA-C  telmisartan-hydrochlorothiazide (MICARDIS HCT) 80-12.5 MG tablet Take 1 tablet by mouth daily. 02/21/17  Yes Almyra Deforest, PA  dexamethasone (DECADRON) 4 MG tablet TAKE 5 TABLETS (20MG) ONCE WEEKLY ON DAYS 1, 8, 15 AND 21 OF EVERY 28 DAY CYCLE Patient taking differently: Take 20 mg by mouth once a week.  07/26/18   Brunetta Genera, MD  glucose monitoring kit (FREESTYLE) monitoring kit 1 each by Does not apply route as needed for other.    [provider]  HYDROcodone-acetaminophen (NORCO/VICODIN) 5-325 MG tablet Take 1 tablet by mouth every 6 (six) hours as needed for moderate pain. Patient not taking: Reported on 09/27/2018 07/28/18   Clovis Riley, MD  lenalidomide (REVLIMID) 15 MG capsule Take 1 capsule (15 mg  total) by mouth daily. Take for 21 days on, 7 days off, repeat every 28 days. Authorization #[id number] 09/18/2018 Patient taking differently: Take 15 mg by mouth See admin instructions. Take for 21 days on, 7 days off, repeat every 28 days. Authorization #[id number] 09/18/2018 09/18/18   Brunetta Genera, MD  levofloxacin (LEVAQUIN) 500 MG tablet Take 1 tablet (500 mg total) by mouth daily. Patient not taking: Reported on 09/27/2018 09/07/18   Brunetta Genera, MD  NINLARO 4 MG capsule TAKE 1 CAPSULE (4MG) BY MOUTH ONCE WEEKLY ON DAYS 1, 8, AND 15, OF EACH 28 DAY CYCLE. TAKE ON AN EMPTY STOMACH 1HR BEFORE OR 2HRS AFTER FOOD 06/23/18   Brunetta Genera, MD  nystatin (MYCOSTATIN) 100000 UNIT/ML suspension Take 5 mLs (500,000 Units total) by mouth 4 (four) times daily. Patient not taking: Reported on 09/27/2018 09/04/18   Brunetta Genera, MD    Inpatient Medications: Scheduled Meds: . acyclovir  400 mg Oral Daily  . allopurinol  300 mg Oral  Daily  . atorvastatin  40 mg Oral Daily  . irbesartan  300 mg Oral Daily   And  . hydrochlorothiazide  12.5 mg Oral Daily  . rivaroxaban  20 mg Oral Q supper  . cyanocobalamin  1,000 mcg Oral Daily   Continuous Infusions:  PRN Meds: acetaminophen, HYDROcodone-acetaminophen, ibuprofen, ondansetron (ZOFRAN) IV  Allergies:    Allergies  Allergen Reactions  . No Known Allergies     Social History:   Social History   Socioeconomic History  . Marital status: Married    Spouse name: Not on file  . Number of children: Not on file  . Years of education: Not on file  . Highest education level: Not on file  Occupational History  . Not on file  Social Needs  . Financial resource strain: Not on file  . Food insecurity:    Worry: Not on file    Inability: Not on file  . Transportation needs:    Medical: Not on file    Non-medical: Not on file  Tobacco Use  . Smoking status: Never Smoker  . Smokeless tobacco: Never Used  Substance and Sexual Activity  . Alcohol use: No  . Drug use: No  . Sexual activity: Not on file  Lifestyle  . Physical activity:    Days per week: Not on file    Minutes per session: Not on file  . Stress: Not on file  Relationships  . Social connections:    Talks on phone: Not on file    Gets together: Not on file    Attends religious service: Not on file    Active member of club or organization: Not on file    Attends meetings of clubs or organizations: Not on file    Relationship status: Not on file  . Intimate partner violence:    Fear of current or ex partner: Not on file    Emotionally abused: Not on file    Physically abused: Not on file    Forced sexual activity: Not on file  Other Topics Concern  . Not on file  Social History Narrative  . Not on file    Family History:    Family History  Problem Relation Age of Onset  . Stroke Mother   . Hypertension Mother   . Alzheimer's disease Father   . Heart failure Father   . Down syndrome  Daughter      ROS:  Please see the history of  present illness.   All other ROS reviewed and negative.     Physical Exam/Data:   Vitals:   09/27/18 1755 09/27/18 2106 09/27/18 2113 09/28/18 0635  BP:  119/72 119/72 129/72  Pulse:  76 76 67  Resp:    18  Temp:  97.6 F (36.4 C) 98.3 F (36.8 C) 97.9 F (36.6 C)  TempSrc:  Oral Oral Oral  SpO2:  99% 100% 98%  Weight: 74.1 kg   73.9 kg  Height: 5' 8"  (1.727 m)       Intake/Output Summary (Last 24 hours) at 09/28/2018 0911 Last data filed at 09/27/2018 2300 Gross per 24 hour  Intake 2390 ml  Output 700 ml  Net 1690 ml   Filed Weights   09/27/18 0745 09/27/18 1755 09/28/18 0635  Weight: 74.8 kg 74.1 kg 73.9 kg   Body mass index is 24.77 kg/m.  General:  Well nourished, well developed, in no acute distress HEENT: normal Lymph: no adenopathy Neck: no JVD Endocrine:  No thryomegaly Vascular: No carotid bruits; FA pulses 2+ bilaterally without bruits  Cardiac:  normal S1, S2; RRR; no murmur Lungs:  clear to auscultation bilaterally, no wheezing, rhonchi or rales  Abd: soft, nontender, no hepatomegaly  Ext: no edema Musculoskeletal:  No deformities, BUE and BLE strength normal and equal Skin: warm and dry  Neuro:  CNs 2-12 intact, no focal abnormalities noted Psych:  Normal affect   EKG:  The EKG was personally reviewed and demonstrates:  SR w/ PVCs Telemetry:  Telemetry was personally reviewed and demonstrates:  NSR   Relevant CV Studies: Coronary CTA 01/2015 Aorta: Normal caliber, no dissection, minimal calcifications around the origin of brachiocephalic arteries.  Aortic Valve:  Trileaflet.  Coronary Arteries:  Normal origin, left dominance.  Left main is a large caliber vessel that gives rise to LAD, dominant LCX artery and a small ramus intermedius. There is no plaque.  LAD is a large caliber vessel that wraps around the apex and gives rise to one diagonal branch. Proximal LAD has a mild  mixed, predominantly calcified plaque at the take off of the 1.diagonal branch associated with 25-50% stenosis. Mid and distal LAD is free of plaque.  The 1.  diagonal branch has ostial 25-50% stenosis.  LCX is a large dominant vessel that gives rise to large PDA and a very small PLVB. There are diffuse minimal calcified plaques in the proximal, mid and distal LCX artery associated with 0-25% stenosis.  LCX gives rise to two OM branches.  They have no plaque.  Ramus intermedius is a small artery with no plaque.  RCA is a small caliber non-dominant vessel with no plaque.  IMPRESSION: 1. Coronary calcium score of 202. This was 58 percentile for age and sex matched control.  2. Normal origin or coronary arteries, left dominance.  3. Moderate CAD in proximal LAD, diffuse mild CAD in LCX artery.  Aggressive risk factor modification for non-obstructive moderate CAD is recommended. No further ischemic workup or cath is needed.  Ena Dawley  Laboratory Data:  Chemistry Recent Labs  Lab 09/25/18 1110 09/27/18 0818  NA 142 138  K 4.1 3.7  CL 106 104  CO2 27 24  GLUCOSE 180* 140*  BUN 27* 19  CREATININE 1.13 0.96  CALCIUM 9.8 10.1  GFRNONAA >60 >60  GFRAA >60 >60  ANIONGAP 9 10    Recent Labs  Lab 09/25/18 1110  PROT 6.6  ALBUMIN 3.8  AST 20  ALT 9  ALKPHOS 63  BILITOT 0.4   Hematology Recent Labs  Lab 09/25/18 1110 09/27/18 0818  WBC 2.9* 3.3*  RBC 3.53* 3.99*  HGB 11.3* 12.4*  HCT 33.9* 38.8*  MCV 96.2 97.2  MCH 32.0 31.1  MCHC 33.3 32.0  RDW 16.9* 14.6  PLT 159 164   Cardiac EnzymesNo results for input(s): TROPONINI in the last 168 hours.  Recent Labs  Lab 09/27/18 0833 09/27/18 1454  TROPIPOC 0.00 0.00    BNPNo results for input(s): BNP, PROBNP in the last 168 hours.  DDimer  Recent Labs  Lab 09/27/18 0836  DDIMER <0.27    Radiology/Studies:  Dg Chest 2 View  Result Date: 09/27/2018 CLINICAL DATA:  Left side chest pain  EXAM: CHEST - 2 VIEW COMPARISON:  06/16/2017 FINDINGS: Heart and mediastinal contours are within normal limits. No focal opacities or effusions. No acute bony abnormality. IMPRESSION: No active cardiopulmonary disease. Electronically Signed   By: Rolm Baptise M.D.   On: 09/27/2018 08:41    Assessment and Plan:   Justin [name] is a 77 y.o. male with a hx of mild-moderate nonobstructive CAD by coronary CTA in 2016, SVT s/p ablation, atrial fibrillation, chronic anticoagulation, h/o CVA, small PFO, HTN, HLD, DM, GERD and multiple myeloma,  who is being seen today for the evaluation of chest pain at the request of Dr. Algis Liming, Internal Medicine.  1. Chest Pain w/ Known CAD: mixed typical and atypical features. New onset left sided chest pain, combination of tightness and sharp. Some mild improvement with TUMS but completely resolved with ASA. However no exertional symptoms. He has h/o known coronary disease with coronary CTA in 2016 showing mild to moderate CAD in the LCx and diagonal. Has additional cardiac risk factors including HTN, HLD, DM, and FH (father w/ CABG in 47s), thus there is risk for disease progression. His troponin's are negative x 2, thus has ruled out for MI. EKG w/ no acute ST changes. D-dimer negative. Given his known disease and risk factors, recommend repeat ischemic w/u with either repeat coronary CTA to assess for disease progression vs NST to assess for ischemia. MD to assess and will determine imaging study as well as timing of w/u. May consider completing w/u as an outpatient. We will determine once results of 2D echo return. MD to follow.    2. Afib: NSR on tele. Rate is controlled. Continue Xarelto for a/c given prior h/o CVA.   3. H/o SVT: s/p SVT ablation by Dr. Lovena Le. NSR on tele w/ PVCs.   4. Multiple Myeloma: followed by oncology.   5. HTN: controlled on current regimen.   6. HLD: on statin therapy w/ Lipitor. Last lipid panel from 2017 showed controlled LDL at 55.  He needs repeat assessment but can be done outpatient.    7. DM: management per IM.     For questions or updates, please contact [place] Please consult [email or web address] for contact info under     Signed, Lyda Jester, PA-C  09/28/2018 9:11 AM

## 2018-09-28 NOTE — Discharge Summary (Signed)
Physician Discharge Summary  Carron Jaggi MHW:[id number] DOB: [birth year]  PCP: Mayra Neer, MD  Admit date: 09/27/2018 Discharge date: 09/28/2018  Recommendations for Outpatient Follow-up:  1. Dr. Mayra Neer, PCP in 1 week with repeat labs (CBC & BMP). 2. Dr. Lyman Bishop, Cardiology: MDs office will call to arrange outpatient coronary CT and MD follow-up.  Home Health: None Equipment/Devices: None  Discharge Condition: Improved and stable CODE STATUS: Full Diet recommendation: Heart healthy & diabetic diet.  Discharge Diagnoses:  Principal Problem:   Chest pain Active Problems:   Controlled type 2 diabetes mellitus with complication, without long-term current use of insulin (HCC)   Hyperlipidemia   Hypertension   GERD (gastroesophageal reflux disease)   Gout   Atherosclerosis of native coronary artery of native heart without angina pectoris   Numbness   CVA (cerebral vascular accident) ([place])   Paroxysmal SVT (supraventricular tachycardia) ([place])   Multiple myeloma ([place])   Brief Summary: 77 year old pleasant married male, PMH of DM 2, HTN, HLD, CVA, PSVT status post ablation, A. fib on Xarelto, mild to moderate nonobstructive CAD by coronary CT in 2016, GERD, gout, multiple myeloma (chemotherapy on hold for the last month), presented to [facility] ED on 09/27/2018 due to chest pain.  He reports that he did not sleep well on night prior to admission for no particular reason.  At around 4 AM, he noted new onset of precordial chest pain.  He is not able to definitely describe type of pain but states that it was 7/10 in intensity.  It was nonradiating.  It may have been associated with mild diaphoresis but no dyspnea, dizziness, lightheadedness or palpitations.  He denies worsening with deep inspiration or chest wall movements.  This was atypical of his GERD symptoms.  He thought that this may have been related to "gas" and tried Tums, omeprazole and even took his Micardis without  significant relief.  Unfortunately he has misplaced his sublingual NTG and was unable to take it.  He then discussed with his wife and decided to call EMS around 7:30 AM.  He was given 4 baby aspirins with gradual improvement of pain.  The pain eventually resolved after several hours without recurrence.  He was admitted for observation and evaluation. Ruled out for MI.  Cardiology consulted and plan outpatient work-up.  Assessment and plan:  1. Chest pain in a patient with known CAD: Patient had some typical and atypical features.  He has history of known CAD by coronary CT in 2016 that showed mild to moderate CAD in the LCx and diagonal.  EKG without acute changes.  Troponins x2-.  D-dimer negative.  Given prior history of CAD and other CAD risk factors (DM 2, HTN, HLD, FH-Father with CABG in 70s), consulted cardiology regarding need for further work-up.  Cardiology evaluated, underwent TTE preliminary reported by cardiologist as normal wall motion and normal EF, formal report pending.  Cardiology discussed with patient regarding options for further evaluation including treadmill nuclear test or CT coronary angiography.  Patient was cleared for discharge by cardiology who will arrange outpatient CT coronary angiography and follow-up with them. 2. Paroxysmal A. fib: Sinus rhythm on monitor.  Not on rate control medications.  Continue Xarelto given prior history of CVA. 3. History of SVT: Status post SVT ablation.  Currently in sinus rhythm. 4. Essential hypertension: Controlled.  Continue Micardis HCT. 5. Hyperlipidemia: Continue home atorvastatin. 6. Type II DM: Continue metformin.  A1c 7.1 on 07/23/2018 7. Multiple myeloma: Followed by outpatient oncology/Dr. Irene Limbo  and reportedly chemotherapy on hold for the last 1 month.  Mild anemia and leukopenia may be related to this.  Follow CBC as outpatient. 8. Suspected peripheral neuropathy: Patient gives typical symptoms of approximately 1 month duration and  reportedly better recently.?  Related to DM versus MM versus chemotherapy.  Outpatient follow-up. 9. GERD: Continue PPI. 10. History of CVA  Consultations:  Cardiology  Procedures:  TEE- as above, formal report pending.   Discharge Instructions  Discharge Instructions    Call MD for:  difficulty breathing, headache or visual disturbances   Complete by:  As directed    Call MD for:  extreme fatigue   Complete by:  As directed    Call MD for:  persistant dizziness or light-headedness   Complete by:  As directed    Call MD for:  severe uncontrolled pain   Complete by:  As directed    Diet - low sodium heart healthy   Complete by:  As directed    Diet Carb Modified   Complete by:  As directed    Increase activity slowly   Complete by:  As directed        Medication List    STOP taking these medications   HYDROcodone-acetaminophen 5-325 MG tablet Commonly known as:  NORCO/VICODIN   levofloxacin 500 MG tablet Commonly known as:  LEVAQUIN   nystatin 100000 UNIT/ML suspension Commonly known as:  MYCOSTATIN     TAKE these medications   acetaminophen 500 MG tablet Commonly known as:  TYLENOL Take 500 mg by mouth every 6 (six) hours as needed.   acyclovir 400 MG tablet Commonly known as:  ZOVIRAX Take 1 tablet (400 mg total) by mouth daily.   ADVIL 200 MG tablet Generic drug:  ibuprofen Take 200 mg by mouth as needed.   allopurinol 300 MG tablet Commonly known as:  ZYLOPRIM Take 300 mg by mouth daily.   atorvastatin 40 MG tablet Commonly known as:  LIPITOR TAKE 1 TABLET EVERY MORNING What changed:  when to take this   cyanocobalamin 1000 MCG tablet Take 1,000 mcg by mouth daily.   dexamethasone 4 MG tablet Commonly known as:  DECADRON TAKE 5 TABLETS (20MG) ONCE WEEKLY ON DAYS 1, 8, 15 AND 21 OF EVERY 28 DAY CYCLE What changed:    how much to take  how to take this  when to take this  additional instructions   fluorouracil 5 % cream Commonly  known as:  EFUDEX Apply 1 application topically daily as needed (for skin cancer prevention).   glucose monitoring kit monitoring kit 1 each by Does not apply route as needed for other.   lactobacillus acidophilus & bulgar chewable tablet Chew 1 tablet by mouth as needed.   lenalidomide 15 MG capsule Commonly known as:  REVLIMID Take 1 capsule (15 mg total) by mouth daily. Take for 21 days on, 7 days off, repeat every 28 days. Authorization #[id number] 09/18/2018 What changed:  when to take this   metFORMIN 1000 MG tablet Commonly known as:  GLUCOPHAGE Take 1,000 mg by mouth daily with breakfast.   NINLARO 4 MG capsule Generic drug:  ixazomib citrate TAKE 1 CAPSULE (4MG) BY MOUTH ONCE WEEKLY ON DAYS 1, 8, AND 15, OF EACH 28 DAY CYCLE. TAKE ON AN EMPTY STOMACH 1HR BEFORE OR 2HRS AFTER FOOD   nitroGLYCERIN 0.4 MG SL tablet Commonly known as:  NITROSTAT Place 1 tablet (0.4 mg total) under the tongue every 5 (five) minutes as needed for chest  pain (Max 3 doses in 15 mins.). What changed:  reasons to take this   omeprazole 20 MG capsule Commonly known as:  PRILOSEC Take 20 mg by mouth every morning.   rivaroxaban 20 MG Tabs tablet Commonly known as:  XARELTO Take 1 tablet (20 mg total) by mouth daily with supper.   telmisartan-hydrochlorothiazide 80-12.5 MG tablet Commonly known as:  MICARDIS HCT Take 1 tablet by mouth daily.      Follow-up Information    Mayra Neer, MD. Schedule an appointment as soon as possible for a visit in 1 week(s).   Specialty:  Family Medicine Why:  To be seen with repeat labs (CBC & BMP). Contact information: [address] [place] [address] [phone]        Pixie Casino, MD Follow up.   Specialty:  Cardiology Why:  MDs office will call you to arrange outpatient coronary CT and MD follow-up. Contact information: [address]  [place] [address] [phone]          Allergies  Allergen  Reactions  . No Known Allergies       Procedures/Studies: Dg Chest 2 View  Result Date: 09/27/2018 CLINICAL DATA:  Left side chest pain EXAM: CHEST - 2 VIEW COMPARISON:  06/16/2017 FINDINGS: Heart and mediastinal contours are within normal limits. No focal opacities or effusions. No acute bony abnormality. IMPRESSION: No active cardiopulmonary disease. Electronically Signed   By: Rolm Baptise M.D.   On: 09/27/2018 08:41      Subjective: Patient denies recurrence of chest pain since last night/ED.  No dyspnea, palpitations, dizziness or lightheadedness.  Reports approximately 1 month history of tingling, "hot and cold sensation" of both feet up to the knees but recently getting better.  Discharge Exam:  Vitals:   09/27/18 1755 09/27/18 2106 09/27/18 2113 09/28/18 0635  BP:  119/72 119/72 129/72  Pulse:  76 76 67  Resp:    18  Temp:  97.6 F (36.4 C) 98.3 F (36.8 C) 97.9 F (36.6 C)  TempSrc:  Oral Oral Oral  SpO2:  99% 100% 98%  Weight: 74.1 kg   73.9 kg  Height: 5' 8"  (1.727 m)       General: Pleasant elderly male, well-built and nourished, sitting up comfortably in bed. Cardiovascular: S1 & S2 heard, RRR, S1/S2 +. No murmurs, rubs, gallops or clicks. No JVD or pedal edema.  Telemetry personally reviewed: Sinus rhythm. Respiratory: Clear to auscultation without wheezing, rhonchi or crackles. No increased work of breathing. Abdominal:  Non distended, non tender & soft. No organomegaly or masses appreciated. Normal bowel sounds heard. CNS: Alert and oriented. No focal deficits. Extremities: no edema, no cyanosis    The results of significant diagnostics from this hospitalization (including imaging, microbiology, ancillary and laboratory) are listed below for reference.     Labs: CBC: Recent Labs  Lab 09/25/18 1110 09/27/18 0818  WBC 2.9* 3.3*  NEUTROABS 2.0 2.2  HGB 11.3* 12.4*  HCT 33.9* 38.8*  MCV 96.2 97.2  PLT 159 852   Basic Metabolic Panel: Recent Labs   Lab 09/25/18 1110 09/27/18 0818  NA 142 138  K 4.1 3.7  CL 106 104  CO2 27 24  GLUCOSE 180* 140*  BUN 27* 19  CREATININE 1.13 0.96  CALCIUM 9.8 10.1   Liver Function Tests: Recent Labs  Lab 09/25/18 1110  AST 20  ALT 9  ALKPHOS 63  BILITOT 0.4  PROT 6.6  ALBUMIN 3.8  Time coordinating discharge: 25 minutes  SIGNED:  Vernell Leep, MD, FACP, [facility]. [facility] Pager [phone]  If 7PM-7AM, please contact night-coverage [email or web address] Password [email or web address] 09/28/2018, 2:17 PM

## 2018-09-28 NOTE — Progress Notes (Signed)
  Echocardiogram 2D Echocardiogram has been performed.  Justin [name] 09/28/2018, 10:33 AM

## 2018-09-28 NOTE — Progress Notes (Signed)
Order for coronary CTA placed. Our office will call pt to arrange appt.

## 2018-09-28 NOTE — Discharge Instructions (Signed)

## 2018-10-01 ENCOUNTER — Ambulatory Visit (INDEPENDENT_AMBULATORY_CARE_PROVIDER_SITE_OTHER): Payer: Medicare Other | Admitting: Internal Medicine

## 2018-10-01 ENCOUNTER — Inpatient Hospital Stay (HOSPITAL_BASED_OUTPATIENT_CLINIC_OR_DEPARTMENT_OTHER): Payer: Medicare Other | Admitting: Hematology

## 2018-10-01 ENCOUNTER — Telehealth: Payer: Self-pay

## 2018-10-01 ENCOUNTER — Encounter: Payer: Self-pay | Admitting: Internal Medicine

## 2018-10-01 VITALS — BP 117/69 | HR 78 | Ht 68.0 in | Wt 170.0 lb

## 2018-10-01 VITALS — BP 118/68 | HR 72 | Temp 97.7°F | Resp 18 | Ht 68.0 in | Wt 168.4 lb

## 2018-10-01 DIAGNOSIS — G620 Drug-induced polyneuropathy: Secondary | ICD-10-CM

## 2018-10-01 DIAGNOSIS — D6481 Anemia due to antineoplastic chemotherapy: Secondary | ICD-10-CM | POA: Diagnosis not present

## 2018-10-01 DIAGNOSIS — I639 Cerebral infarction, unspecified: Secondary | ICD-10-CM

## 2018-10-01 DIAGNOSIS — R0789 Other chest pain: Secondary | ICD-10-CM

## 2018-10-01 DIAGNOSIS — I471 Supraventricular tachycardia: Secondary | ICD-10-CM

## 2018-10-01 DIAGNOSIS — E538 Deficiency of other specified B group vitamins: Secondary | ICD-10-CM | POA: Diagnosis not present

## 2018-10-01 DIAGNOSIS — Z8673 Personal history of transient ischemic attack (TIA), and cerebral infarction without residual deficits: Secondary | ICD-10-CM

## 2018-10-01 DIAGNOSIS — C9000 Multiple myeloma not having achieved remission: Secondary | ICD-10-CM

## 2018-10-01 DIAGNOSIS — T451X5A Adverse effect of antineoplastic and immunosuppressive drugs, initial encounter: Secondary | ICD-10-CM | POA: Diagnosis not present

## 2018-10-01 DIAGNOSIS — I10 Essential (primary) hypertension: Secondary | ICD-10-CM | POA: Diagnosis not present

## 2018-10-01 DIAGNOSIS — D6959 Other secondary thrombocytopenia: Secondary | ICD-10-CM

## 2018-10-01 DIAGNOSIS — I480 Paroxysmal atrial fibrillation: Secondary | ICD-10-CM | POA: Diagnosis not present

## 2018-10-01 DIAGNOSIS — Z79899 Other long term (current) drug therapy: Secondary | ICD-10-CM

## 2018-10-01 DIAGNOSIS — Z7901 Long term (current) use of anticoagulants: Secondary | ICD-10-CM

## 2018-10-01 DIAGNOSIS — I2510 Atherosclerotic heart disease of native coronary artery without angina pectoris: Secondary | ICD-10-CM | POA: Diagnosis not present

## 2018-10-01 DIAGNOSIS — E119 Type 2 diabetes mellitus without complications: Secondary | ICD-10-CM | POA: Diagnosis not present

## 2018-10-01 DIAGNOSIS — C44622 Squamous cell carcinoma of skin of right upper limb, including shoulder: Secondary | ICD-10-CM | POA: Diagnosis not present

## 2018-10-01 DIAGNOSIS — R079 Chest pain, unspecified: Secondary | ICD-10-CM

## 2018-10-01 MED ORDER — METOPROLOL TARTRATE 50 MG PO TABS: ORAL_TABLET | ORAL | 0 refills | Status: DC

## 2018-10-01 NOTE — Progress Notes (Signed)
[name] Kitchen    HEMATOLOGY/ONCOLOGY CLINIC NOTE  Date of Service: 10/01/2018    Patient Care Team: Mayra Neer, MD as PCP - General (Family Medicine) Debara Pickett Nadean Corwin, MD as PCP - Cardiology (Cardiology)  CHIEF COMPLAINTS/:   F/u for continued mx of Myeloma  HISTORY OF PRESENTING ILLNESS:   Justin [name] is a wonderful 77 y.o. 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  Patient has a history of hypertension, diabetes, GERD, gout, squamous cell skin cancers, elevated PSA, SVT status post ablation in May 2016, chronic back pain who has a history of recurrent CVA/TIA's. He apparently had a left hemispheric TIA June 2017 and has had recurrent) subcortical infarcts thought to be related to small vessel disease. He was noted to have a small PFO that was of questionable significance. He was initially on aspirin and then continued and aspirin + plavix for secondary stroke prevention. He follows with Dr. Antony Contras for his neurology cares.  An SPEP was done due to elevated total proteins of 8.9 and showed an M spike of 2 g/dL. He was also noted to have an elevated total protein of 8.6 in April 2017. He was referred to [place] for further evaluation of his M spike. Blood test did not reveal any hypercalcemia, no significant renal failure. He has been noted to have developed anemia since June the serum and his hemoglobin was 12.6 and is now down to the mid 10 range. He notes no focal bone pains at this time. Overt acute weight loss. No fevers no chills no night sweats.  CURRENT THERAPY:    Ixazomib+ Revlimid + Dexamethasone  INTERVAL HISTORY   Jakaden Ouzts presents to the office today for follow-up of his myeloma. The patient's last visit with [place] was on 09/04/18. The pt reports that he is doing well overall.   The pt is anticipating a stress test with cardiology to work up his recent lower chest pain for which he presented to the ED on  09/27/18 and was admitted through 09/28/18. He will be seeing his cardiologist Dr. Lyman Bishop later today. The pt notes that he has been taking advil for his lower chest pain.   The pt reports that his diarrhea has completely resolved in the interim after finishing his course of Levofloxacin for his E.coli infection.   The pt also notes that his thrush has resolved in the interim.   The pt notes that his tingling/numbness feeling has worsened in both legs, spreading into his lower legs, despite not taking Ninlaro in the interim as his diarrhea was previously limiting. The pt is ambulating intermittently with a cane or walker.  Lab results (09/27/18) of CBC w/diff, BMP is as follows: all values are WNL except for WBC at 3.3k, RBC at 3.99, HGB at 12.4, HCT at 38.8, Lymphs abs at 600, Glucose at 140. 09/25/18 MMP revealed all values WNL except for IgA at 22, IgM at 14, M Protein at 0.3 09/25/18 SFLC revealed all values WNL.   On review of systems, pt reports resolved diarrhea, lower chest pain, resolved thrush, tingling/numbness in legs, and denies leg swelling, current concerns for infections, new skin lesions, skin rashes, and any other symptoms.   MEDICAL HISTORY:  Past Medical History:  Diagnosis Date  . Anemia   . Arthritis   . 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 Diabetes mellitus without complication ([place])   .  GERD (gastroesophageal reflux disease)   . Gout   . History of loop recorder   . Hypertension   . Left leg weakness   . Multiple myeloma ([place]) 09/27/2018  . Paroxysmal SVT (supraventricular tachycardia) ([place])    a. s/p RFCA on 05/01/15  . Stroke [facility]) 2017   TIA and mild stroke; denies stroke symptoms since then     SURGICAL HISTORY: Past Surgical History:  Procedure Laterality Date  . ATRIAL FIBRILLATION ABLATION     Dr. Lovena Le  . BASAL CELL CARCINOMA EXCISION     off of back  . ELECTROPHYSIOLOGIC STUDY N/A 05/01/2015   Procedure: SVT  Ablation;  Surgeon: Evans Lance, MD;  Location: [place] [facility];  Service: Cardiovascular;  Laterality: N/A;  . EP IMPLANTABLE DEVICE N/A 06/04/2016   Procedure: Loop Recorder Insertion;  Surgeon: Thompson Grayer, MD;  Location: [place] [facility];  Service: Cardiovascular;  Laterality: N/A;  . INGUINAL HERNIA REPAIR Bilateral 06/16/2017   Procedure: LAPAROSCOPIC BILATERAL INGUINAL HERNIA REPAIR;  Surgeon: Clovis Riley, MD;  Location: [place];  Service: General;  Laterality: Bilateral;  . INSERTION OF MESH Bilateral 06/16/2017   Procedure: INSERTION OF MESH;  Surgeon: Clovis Riley, MD;  Location: [place];  Service: General;  Laterality: Bilateral;  . MASS EXCISION Right 07/28/2018   Procedure: EXCISION OF RIGHT FOREARM MASS;  Surgeon: Clovis Riley, MD;  Location: [facility];  Service: General;  Laterality: Right;  . SQUAMOUS CELL CARCINOMA EXCISION    . TEE WITHOUT CARDIOVERSION N/A 06/04/2016   Procedure: TRANSESOPHAGEAL ECHOCARDIOGRAM (TEE);  Surgeon: Pixie Casino, MD;  Location: [facility] END[facility];  Service: Cardiovascular;  Laterality: N/A;    SOCIAL HISTORY: Social History   Socioeconomic History  . Marital status: Married    Spouse name: Not on file  . Number of children: Not on file  . Years of education: Not on file  . Highest education level: Not on file  Occupational History  . Not on file  Social Needs  . Financial resource strain: Not on file  . Food insecurity:    Worry: Not on file    Inability: Not on file  . Transportation needs:    Medical: Not on file    Non-medical: Not on file  Tobacco Use  . Smoking status: Never Smoker  . Smokeless tobacco: Never Used  Substance and Sexual Activity  . Alcohol use: No  . Drug use: No  . Sexual activity: Not on file  Lifestyle  . Physical activity:    Days per week: Not on file    Minutes per session: Not on file  . Stress: Not on file  Relationships  . Social connections:    Talks on phone: Not on file    Gets  together: Not on file    Attends religious service: Not on file    Active member of club or organization: Not on file    Attends meetings of clubs or organizations: Not on file    Relationship status: Not on file  . Intimate partner violence:    Fear of current or ex partner: Not on file    Emotionally abused: Not on file    Physically abused: Not on file    Forced sexual activity: Not on file  Other Topics Concern  . Not on file  Social History Narrative  . Not on file    FAMILY HISTORY: Family History  Problem Relation Age of Onset  . Stroke Mother   .  Hypertension Mother   . Alzheimer's disease Father   . Heart failure Father   . Down syndrome Daughter     ALLERGIES:  is allergic to no known allergies.  MEDICATIONS:  Current Outpatient Medications  Medication Sig Dispense Refill  . acyclovir (ZOVIRAX) 400 MG tablet Take 1 tablet (400 mg total) by mouth daily. 90 tablet 0  . allopurinol (ZYLOPRIM) 300 MG tablet Take 300 mg by mouth daily.     [name] Kitchen atorvastatin (LIPITOR) 40 MG tablet TAKE 1 TABLET EVERY MORNING (Patient taking differently: Take 40 mg by mouth daily. ) 90 tablet 4  . cyanocobalamin 1000 MCG tablet Take 1,000 mcg by mouth daily.    . fluorouracil (EFUDEX) 5 % cream Apply 1 application topically daily as needed (for skin cancer prevention).   0  . glucose monitoring kit (FREESTYLE) monitoring kit 1 each by Does not apply route as needed for other.    . ibuprofen (ADVIL) 200 MG tablet Take 200 mg by mouth as needed.    . lactobacillus acidophilus & bulgar (LACTINEX) chewable tablet Chew 1 tablet by mouth as needed.    . metFORMIN (GLUCOPHAGE) 1000 MG tablet Take 1,000 mg by mouth daily with breakfast.    . nitroGLYCERIN (NITROSTAT) 0.4 MG SL tablet Place 1 tablet (0.4 mg total) under the tongue every 5 (five) minutes as needed for chest pain (Max 3 doses in 15 mins.). 30 tablet 0  . omeprazole (PRILOSEC) 20 MG capsule Take 20 mg by mouth every morning.     .  rivaroxaban (XARELTO) 20 MG TABS tablet Take 1 tablet (20 mg total) by mouth daily with supper. 90 tablet 2  . telmisartan-hydrochlorothiazide (MICARDIS HCT) 80-12.5 MG tablet Take 1 tablet by mouth daily. 90 tablet 3  . acetaminophen (TYLENOL) 500 MG tablet Take 500 mg by mouth every 6 (six) hours as needed.    [name] Kitchen dexamethasone (DECADRON) 4 MG tablet TAKE 5 TABLETS (20MG) ONCE WEEKLY ON DAYS 1, 8, 15 AND 21 OF EVERY 28 DAY CYCLE (Patient not taking: Reported on 10/01/2018) 60 tablet 4  . lenalidomide (REVLIMID) 15 MG capsule Take 1 capsule (15 mg total) by mouth daily. Take for 21 days on, 7 days off, repeat every 28 days. Authorization #[id number] 09/18/2018 (Patient not taking: Reported on 10/01/2018) 21 capsule 0  . NINLARO 4 MG capsule TAKE 1 CAPSULE (4MG) BY MOUTH ONCE WEEKLY ON DAYS 1, 8, AND 15, OF EACH 28 DAY CYCLE. TAKE ON AN EMPTY STOMACH 1HR BEFORE OR 2HRS AFTER FOOD (Patient not taking: Reported on 10/01/2018) 3 capsule 2   No current facility-administered medications for this visit.     REVIEW OF SYSTEMS:    A 10+ POINT REVIEW OF SYSTEMS WAS OBTAINED including neurology, dermatology, psychiatry, cardiac, respiratory, lymph, extremities, GI, GU, Musculoskeletal, constitutional, breasts, reproductive, HEENT.  All pertinent positives are noted in the HPI.  All others are negative.   PHYSICAL EXAMINATION:   ECOG PERFORMANCE STATUS: 2 - Symptomatic, <50% confined to bed  Vitals:   10/01/18 0906  BP: 118/68  Pulse: 72  Resp: 18  Temp: 97.7 F (36.5 C)  SpO2: 100%   Filed Weights   10/01/18 0906  Weight: 168 lb 6.4 oz (76.4 kg)   Body mass index is 25.61 kg/m.  GENERAL:alert, in no acute distress and comfortable SKIN: no acute rashes, (+) previous right wrist raised indulated lesion with crusting concerning for cutaneous squamous cell carcinoma, now healing after surgery, currently bandaged EYES: conjunctiva are pink and non-injected,  sclera anicteric OROPHARYNX: MMM, no  exudates, no oropharyngeal erythema or ulceration NECK: supple, no JVD LYMPH:  no palpable lymphadenopathy in the cervical, axillary or inguinal regions LUNGS: clear to auscultation b/l with normal respiratory effort HEART: regular rate & rhythm ABDOMEN:  normoactive bowel sounds , non tender, not distended. No palpable hepatosplenomegaly.  Extremity: no pedal edema PSYCH: alert & oriented x 3 with fluent speech NEURO: no focal motor/sensory deficits    LABORATORY DATA:  I have reviewed the data as listed. CBC Latest Ref Rng & Units 09/27/2018 09/25/2018 09/01/2018  WBC 4.0 - 10.5 K/uL 3.3(L) 2.9(L) 2.8(L)  Hemoglobin 13.0 - 17.0 g/dL 12.4(L) 11.3(L) 10.9(L)  Hematocrit 39.0 - 52.0 % 38.8(L) 33.9(L) 32.6(L)  Platelets 150 - 400 K/uL 164 159 28(L)  ANC 2.0K  CMP Latest Ref Rng & Units 09/27/2018 09/25/2018 09/01/2018  Glucose 70 - 99 mg/dL 140(H) 180(H) 114(H)  BUN 8 - 23 mg/dL 19 27(H) 20  Creatinine 0.61 - 1.24 mg/dL 0.96 1.13 1.24  Sodium 135 - 145 mmol/L 138 142 139  Potassium 3.5 - 5.1 mmol/L 3.7 4.1 3.8  Chloride 98 - 111 mmol/L 104 106 104  CO2 22 - 32 mmol/L 24 27 25   Calcium 8.9 - 10.3 mg/dL 10.1 9.8 9.3  Total Protein 6.5 - 8.1 g/dL - 6.6 6.3(L)  Total Bilirubin 0.3 - 1.2 mg/dL - 0.4 0.4  Alkaline Phos 38 - 126 U/L - 63 61  AST 15 - 41 U/L - 20 31  ALT 0 - 44 U/L - 9 10    Lab Results  Component Value Date   IRON 45 06/02/2017   TIBC 233 06/02/2017   IRONPCTSAT 19 (L) 06/02/2017   (Iron and TIBC)  Lab Results  Component Value Date   FERRITIN 308 08/26/2017         RADIOGRAPHIC STUDIES: I have personally reviewed the radiological images as listed and agreed with the findings in the report.       Bone Marrow Biopsy 12/24/2016 (Accession [id number])  Diagnosis Bone Marrow, Aspirate,Biopsy, and Clot, left iliac crest - HYPERCELLULAR BONE MARROW FOR AGE WITH PLASMA CELL NEOPLASM. - SEE COMMENT. PERIPHERAL BLOOD: - NORMOCYTIC-NORMOCHROMIC ANEMIA. -  LEUKOPENIA.  DG Bone Survey Met (Accession [id number]) (Order [id number])  Imaging  Date: 11/28/2016 Department: [facility] Released By: Pricilla Riffle Authorizing: Brunetta Genera, MD  Exam Information   Status Exam Begun  Exam Ended   Final [99] 11/28/2016 11:59 AM 11/28/2016 12:36 PM  PACS Images   Show images for DG Bone Survey Met  Study Result   CLINICAL DATA:  Monoclonal gammopathy of unknown significance. History of squamous cell carcinoma excision, basal cell carcinoma excision.  EXAM: METASTATIC BONE SURVEY  COMPARISON:  CT neck, chest, abdomen and pelvis from 10/29/16, MRI of the head from 06/01/2016, CXR 10/27/2016, lumbar spine radiographs 06/24/2016  FINDINGS: Lateral skull: Small occipital lucency which may represent a normal arachnoid granulation posteriorly in the occiput. No definite lytic abnormality.  Cervical spine AP and lateral: Disc space narrowing at C2-3, and from C4 through C7. C4-5, C5-6 and C6-7 uncovertebral joint spurring bilaterally. No lytic abnormality.  Thoracic spine AP and lateral: T8-9 right-sided osteophytes and to a lesser degree T9-10. No lytic abnormality. Slight multilevel lumbar thoracic disc space narrowing likely degenerative.  Lumbar spine AP and lateral: Disc space narrowing at L4-5 and to a greater extent L5-S1. No lytic disease. L3 through S1 facet sclerosis.  AP pelvis: Negative for lytic disease.  Bilateral  upper and lower extremities shoulders through wrist and from the hips through ankle: Negative for lytic disease. Joint space narrowing of the femorotibial compartments both knees. Subchondral cyst of the right patella.  CXR: Clear lungs. Cardiac implantable monitoring device projects over the left heart. Aortic atherosclerosis. No lytic disease.  IMPRESSION: No findings suspicious for lytic disease.   Electronically Signed   By: Ashley Royalty M.D.    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 (12/2016) Peak M spike 2.6  Cytogenetics and Myeloma FISH panel.- trisomy 11 Patient has normocytic anemia without any other clear etiology. (>2g/dl lower that lower limit of normal which is 13) which per criteria would place this in the Multiple myeloma category as opposed to Smoldering Multiple myeloma (Borderline criterion)  No overt renal failure or hypercalcemia at this time No focal bone pains though he has significant chronic back pain related to degenerative disc disease. Bone survey shows no overt lytic lesions.  2) anemia and thrombocytopenia -- related to treatment (Ixazomib + Revlimid)   3) Significant Diarrhea and intermittent nausea 09/04/18 Enteropathogenic E.coli detected -Ordered Levofloxacin   4) Grade 2 Neuropathy -- in b/l feet and halfway up lower leg without pain -Seconadry to Automatic Data -Will monitor closely and will reduce medication if needed   PLAN -Discussed pt labwork; 09/27/18 CBC revealed stable blood counts. 09/25/18 MMP revealed M Protein stable at 0.3. 09/25/18 SFLC revealed all values WNL including Kappa:Lambda ratio.  -Will continue to hold Ninlaro, Revlimid, and Dexamethasone at this time given recent cardiology concerns and current work up -Continue follow up with Dr. Lyman Bishop in cardiology later today 10/01/18 -Recommend pt see Dr. Leonie Man in neurology for neuropathy vs radiculopathy evaluation -Will consider maintenance treatment with low dose Revlimid if neuropathy becomes limiting  -Continue Vitamin B12 replacement  -Continue follow up with dermatology for regular skin checks -Recommended that the pt keep his legs well moisturized  -Will see the pt back in 4 weeks     5) Borderline low B12 levels, previously 299 --- on replacement - 04/2018 B12 level now improved to  724 -continue replacement.  6) recurrent CVA - thought to be related to small vessel disease as per neurology. Has a small PFO which could be an additional risk factor as well as his afib  7)  P afib Plan -Continue on Xarelto per cardiology.  8) H/o recurrent SCC 9) Occasional Rash, secondary to SCC 10) Oral Thrush - likely secondary to current antibiotics  Plan  -Right wrist raised indulated lesion with crusting concerning for cutaneous squamous cell carcinoma seen in clinic on 06/23/18 -Underwent surgical excision on right forearm with Dr. Romana Juniper on 07/28/18. His surgical pathology showed evidence of Squamous Cell Carcinoma. -Held Xarelto two days prior to surgery, returned to Xarelto at surgeon's clearance -After surgery he was given antibiotics due to the fungal infection he had which was found in surgery. He is almost done with dose.  -I instructed him to take Probiotic or live cultured yogurt while he is on antibiotics -He was given Efudex by Dermatologist, but stopped after he had skin breakout.  11) Lower extremity discomfort - improved, stable. Likely Discogenic pain -[place] on 12/2017 revealed no blood clot -Pt has established care with orthopedist and PT.  12) Patient Active Problem List   Diagnosis Date Noted  .  Chest pain 09/27/2018  . Multiple myeloma ([place]) 09/27/2018  . History of loop recorder 06/12/2017  . Cryptogenic stroke ([place]) 10/28/2016  . Gait abnormality   . History of CVA (cerebrovascular accident)   . History of TIA (transient ischemic attack)   . Benign essential HTN   . Paroxysmal SVT (supraventricular tachycardia) ([place])   . Acute blood loss anemia   . Acute ischemic stroke ([place])   . CVA (cerebral vascular accident) ([place]) 10/26/2016  . History of recent stroke 06/06/2016  . 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 TIA (transient ischemic attack) 06/02/2016  . Numbness 06/01/2016  . Right arm numbness 06/01/2016  . Atherosclerosis of native  coronary artery of native heart without angina pectoris 03/20/2016  . Hypertension   . GERD (gastroesophageal reflux disease)   . Gout   . S/P RF ablation operation for arrhythmia 12/13/2014  . Hyperlipidemia 12/13/2014  . Controlled type 2 diabetes mellitus with complication, without long-term current use of insulin ([place]) 12/07/2014   PLAN -Continue f/u with PCP (blood sugars)   RTC with Dr Irene Limbo in 4 weeks with labs   All of the patients questions were answered with apparent satisfaction. The patient knows to call the clinic with any problems, questions or concerns.  The total time spent in the appt was 20 minutes and more than 50% was on counseling and direct patient cares.      Sullivan Lone MD MS AAHIVMS [facility] Hematology/Oncology Physician [facility]  (Office):       [phone] (Work cell):  [phone] (Fax):           [phone]  I, Baldwin [place], am acting as a scribe for Dr. Irene Limbo  .I have reviewed the above documentation for accuracy and completeness, and I agree with the above. Brunetta Genera MD

## 2018-10-01 NOTE — Telephone Encounter (Signed)
Printed avs and calender of upcoming appointment. Per 10/10 los 

## 2018-10-01 NOTE — Patient Instructions (Signed)
Medication Instructions:  No Changes If you need a refill on your cardiac medications before your next appointment, please call your pharmacy.   Lab work: BMET - one week prior to coronary CT test This can be done at Dr. Lysbeth Penner office - no appointment is needed If you have labs (blood work) drawn today and your tests are completely normal, you will receive your results only by: [name] Kitchen MyChart Message (if you have MyChart) OR . A paper copy in the mail If you have any lab test that is abnormal or we need to change your treatment, we will call you to review the results.  Testing/Procedures: Coronary CT Angiogram with FFR done at [facility]  Follow-Up: At [facility], you and your health needs are our priority.  As part of our continuing mission to provide you with exceptional heart care, we have created designated Provider Care Teams.  These Care Teams include your primary Cardiologist (physician) and Advanced Practice Providers (APPs -  Physician Assistants and Nurse Practitioners) who all work together to provide you with the care you need, when you need it. You will need a follow up appointment in 4-6 weeks - to be scheduled AFTER coronary CT study. You may see Pixie Casino, MD or one of the following Advanced Practice Providers on your designated Care Team: [place], [place] . Fabian Sharp, PA-C  Any Other Special Instructions Will Be Listed Below (If Applicable).  Coronary CT instructions  Please arrive at the [facility] main entrance of [facility] at xx:xx AM (30-45 minutes prior to test start time)  [facility] [address], [place] [phone]  Proceed to the [facility] Radiology Department (First Floor).  Please follow these instructions carefully (unless otherwise directed):  Hold all erectile dysfunction medications at least 48 hours prior to test.  On the Night Before the Test: . Be sure to Drink plenty of water. . Do not consume  any caffeinated/decaffeinated beverages or chocolate 12 hours prior to your test. . Do not take any antihistamines 12 hours prior to your test. . If you take Metformin do not take 24 hours prior to test.  On the Day of the Test: . Drink plenty of water. Do not drink any water within one hour of the test. . Do not eat any food 4 hours prior to the test. . You may take your regular medications prior to the test.  . Take metoprolol (Lopressor) 50mg  two hours prior to test.      After the Test: . Drink plenty of water. . After receiving IV contrast, you may experience a mild flushed feeling. This is normal. . On occasion, you may experience a mild rash up to 24 hours after the test. This is not dangerous. If this occurs, you can take Benadryl 25 mg and increase your fluid intake. . If you experience trouble breathing, this can be serious. If it is severe call 911 IMMEDIATELY. If it is mild, please call our office. . If you take any of these medications: Glipizide/Metformin, Avandament, Glucavance, please do not take 48 hours after completing test.

## 2018-10-04 ENCOUNTER — Encounter: Payer: Self-pay | Admitting: Internal Medicine

## 2018-10-04 NOTE — Progress Notes (Signed)
OFFICE NOTE  Chief Complaint:  Hospital follow-up  Primary Care Physician: Mayra Neer, MD  HPI:  Justin [name] is a pleasant 77 year old male who I met recently in the hospital. He presented with chest pain that he developed while sitting his computer. The symptoms had some typical and atypical features for angina. He ruled out for MI and underwent nuclear stress testing which was negative for ischemia. During the stress test while exercising he developed an SVT with heart rate that shot up abruptly to 220. This responded to vagal maneuvers and then reoccurred and eventually subsided. He was started on low-dose beta blocker and eventually discharged without any recurrent symptoms. He was placed on a monitor and has follow-up with cardiac electrophysiology to see Dr. Lovena Le in January. 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 He was started on Lipitor which was started the hospital for for coronary disease, however his LDL is only 95. He is a diabetic. His metformin was increased to 500 twice a day which is improved his blood sugars. He recently saw his primary care provider felt that he was doing well.  I saw Justin [name] back in the office today. He was referred to Dr. Cristopher Peru for SVT ablation consideration, and this was offered to him. Prior to having this scheduled he developed an episode of chest pain again and that brought him to the hospital yesterday. He was evaluated by Dr. Dorris Carnes, and had a right upper quadrant ultrasound which was unremarkable. He also had coronary CT angiogram which demonstrated mild to moderate coronary disease and an intermediate coronary calcium score of 202. He has subsequently worn a monitor between 12/21/2015 and 01/18/2015 which showed no recurrent SVT and normal sinus rhythm.  The etiology of his pain episodes is unclear however may be  related to reflux.   Justin [name] returns today for follow-up. He reports he is done very well without any recurrent SVT status post catheter ablation by Dr. Cristopher Peru. He is seeing me primarily for risk factor modification and treatment of asymptomatic coronary artery disease which was seen by coronary artery CT angiography. He was found to have 2 areas of mild coronary artery disease and a calcium score which was moderately elevated at 202. Since then he's been on lipid therapy with Lipitor and takes daily aspirin as well as my card is HCTZ for hypertension. Blood pressure is well-controlled today 106/60. He is due for repeat lipid profile and will need a refill on his cholesterol medication. He denies any chest pain or shortness of breath. 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  06/06/2016  Justin [name] returns today for follow-up. He was seen by myself in the hospital after he presented with symptoms of possible TIA. This is following recent hospitalization at [facility] in [place] where he had a stroke. MRI confirmed this although he has had no significant lasting deficit. His initial symptoms were left arm heaviness and numbness. He also subsequently had some right arm tingling but the symptoms were different than his original presentation. This was thought to be TIA. He was evaluated by neurology however repeat imaging failed to show any new deficits. Subsequently he was referred for a TEE which I performed. This demonstrated a small bidirectional PFO. He had a bifid left atrial appendage  without any thrombus. While he did have PFO, it's not clear that this was the etiology of his stroke or TIA. Venous Dopplers of the legs were negative for thrombus. He was changed from aspirin to Plavix. We also recommended and implanted loop recorder which was placed by Dr. Rayann Heman. The thought is that he may be having intermittent atrial fibrillation, as he has a  history of SVT underwent ablation.  06/12/2017  Justin [name] returns today for follow-up. He's had a very eventful year. 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 He's had problems with recurrent skin cancer. He has had no further stroke or TIA and has done well on aspirin and Plavix. Recently his neurologist he discontinued Plavix and is currently only on full dose aspirin. He did have an implanted loop recorder and multiple interrogations have failed to show any recurrent or intermittent atrial fibrillation. He denies any recurrent SVT. This is not been noted as well. Blood pressure is well controlled and he said recent weight loss which is appropriate at this point.  10/01/2018  Justin [name] is seen today in follow-up.  Is been over a year since I saw him.  Unfortunately was recently admitted with precordial chest pain last week.  He ruled out for MI and an outpatient echocardiogram was recommended.  Demonstrated LVEF 55 to 60%, mild LVH and grade 1 diastolic dysfunction.  No regional wall motion abnormalities.  Then he reports no recurrent chest pain.  He continues however to have some physical decline.  He was noted to be using a walker today which she did not last year.  Partially may be related to stroke, however previously he was flying airplanes 2 years ago.  Denies recurrent palpitations.  Loop recorder interrogation is failed to show recurrent arrhythmia.  PMHx:  Past Medical History:  Diagnosis Date  . Anemia   . Arthritis   . 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 Diabetes mellitus without complication ([place])   . GERD (gastroesophageal reflux disease)   . Gout   . History of loop recorder   . Hypertension   . Left leg weakness   . Multiple myeloma ([place]) 09/27/2018  . Paroxysmal SVT (supraventricular tachycardia) ([place])    a. s/p RFCA on 05/01/15  . Stroke [facility]) 2017   TIA and mild stroke; denies stroke symptoms since then      Past Surgical History:  Procedure Laterality Date  . ATRIAL FIBRILLATION ABLATION     Dr. Lovena Le  . BASAL CELL CARCINOMA EXCISION     off of back  . ELECTROPHYSIOLOGIC STUDY N/A 05/01/2015   Procedure: SVT Ablation;  Surgeon: Evans Lance, MD;  Location: [facility];  Service: Cardiovascular;  Laterality: N/A;  . EP IMPLANTABLE DEVICE N/A 06/04/2016   Procedure: Loop Recorder Insertion;  Surgeon: Thompson Grayer, MD;  Location: [facility];  Service: Cardiovascular;  Laterality: N/A;  . INGUINAL HERNIA REPAIR Bilateral 06/16/2017   Procedure: LAPAROSCOPIC BILATERAL INGUINAL HERNIA REPAIR;  Surgeon: Clovis Riley, MD;  Location: [facility];  Service: General;  Laterality: Bilateral;  . INSERTION OF MESH Bilateral 06/16/2017   Procedure: INSERTION OF MESH;  Surgeon: Clovis Riley, MD;  Location: [facility];  Service: General;  Laterality: Bilateral;  . MASS EXCISION Right 07/28/2018   Procedure: EXCISION OF RIGHT FOREARM MASS;  Surgeon: Clovis Riley, MD;  Location: [facility];  Service: General;  Laterality: Right;  .  SQUAMOUS CELL CARCINOMA EXCISION    . TEE WITHOUT CARDIOVERSION N/A 06/04/2016   Procedure: TRANSESOPHAGEAL ECHOCARDIOGRAM (TEE);  Surgeon: Pixie Casino, MD;  Location: [facility];  Service: Cardiovascular;  Laterality: N/A;    FAMHx:  Family History  Problem Relation Age of Onset  . Stroke Mother   . Hypertension Mother   . Alzheimer's disease Father   . Heart failure Father   . Down syndrome Daughter     SOCHx:   reports that he has never smoked. He has never used smokeless tobacco. He reports that he does not drink alcohol or use drugs.  ALLERGIES:  Allergies  Allergen Reactions  . No Known Allergies     ROS: Pertinent items noted in HPI and remainder of comprehensive ROS otherwise negative.  HOME MEDS: Current Outpatient Medications  Medication Sig Dispense Refill  . acetaminophen (TYLENOL) 500 MG tablet Take 500 mg by mouth every 6 (six)  hours as needed.    [name] Kitchen acyclovir (ZOVIRAX) 400 MG tablet Take 1 tablet (400 mg total) by mouth daily. 90 tablet 0  . allopurinol (ZYLOPRIM) 300 MG tablet Take 300 mg by mouth daily.     [name] Kitchen atorvastatin (LIPITOR) 40 MG tablet TAKE 1 TABLET EVERY MORNING (Patient taking differently: Take 40 mg by mouth daily. ) 90 tablet 4  . cyanocobalamin 1000 MCG tablet Take 1,000 mcg by mouth daily.    [name] Kitchen dexamethasone (DECADRON) 4 MG tablet TAKE 5 TABLETS (20MG) ONCE WEEKLY ON DAYS 1, 8, 15 AND 21 OF EVERY 28 DAY CYCLE 60 tablet 4  . fluorouracil (EFUDEX) 5 % cream Apply 1 application topically daily as needed (for skin cancer prevention).   0  . glucose monitoring kit (FREESTYLE) monitoring kit 1 each by Does not apply route as needed for other.    . ibuprofen (ADVIL) 200 MG tablet Take 200 mg by mouth as needed.    . lactobacillus acidophilus & bulgar (LACTINEX) chewable tablet Chew 1 tablet by mouth as needed.    [name] Kitchen lenalidomide (REVLIMID) 15 MG capsule Take 1 capsule (15 mg total) by mouth daily. Take for 21 days on, 7 days off, repeat every 28 days. Authorization #[id number] 09/18/2018 21 capsule 0  . metFORMIN (GLUCOPHAGE) 1000 MG tablet Take 1,000 mg by mouth daily with breakfast.    . NINLARO 4 MG capsule TAKE 1 CAPSULE (4MG) BY MOUTH ONCE WEEKLY ON DAYS 1, 8, AND 15, OF EACH 28 DAY CYCLE. TAKE ON AN EMPTY STOMACH 1HR BEFORE OR 2HRS AFTER FOOD 3 capsule 2  . nitroGLYCERIN (NITROSTAT) 0.4 MG SL tablet Place 1 tablet (0.4 mg total) under the tongue every 5 (five) minutes as needed for chest pain (Max 3 doses in 15 mins.). 30 tablet 0  . omeprazole (PRILOSEC) 20 MG capsule Take 20 mg by mouth every morning.     . rivaroxaban (XARELTO) 20 MG TABS tablet Take 1 tablet (20 mg total) by mouth daily with supper. 90 tablet 2  . telmisartan-hydrochlorothiazide (MICARDIS HCT) 80-12.5 MG tablet Take 1 tablet by mouth daily. 90 tablet 3  . metoprolol tartrate (LOPRESSOR) 50 MG tablet Take 1 tablet by mouth TWO HOURS prior to  test 1 tablet 0   No current facility-administered medications for this visit.     LABS/IMAGING: No results found for this or any previous visit (from the past 48 hour(s)). No results found.  VITALS: BP 117/69   Pulse 78   Ht 5' 8"  (1.727 m)   Wt 170 lb (77.1 kg)  SpO2 100%   BMI 25.85 kg/m   EXAM: General appearance: alert and no distress Neck: no carotid bruit and no JVD Lungs: clear to auscultation bilaterally Heart: regular rate and rhythm, S1, S2 normal, no murmur, click, rub or gallop Abdomen: soft, non-tender; bowel sounds normal; no masses,  no organomegaly Extremities: extremities normal, atraumatic, no cyanosis or edema Pulses: 2+ and symmetric Skin: Skin color, texture, turgor normal. No rashes or lesions Neurologic: Grossly normal Psych: Pleasant  EKG: Deferred  ASSESSMENT: 1. Chest pain 2. Stroke and TIA, status post ILR - no evidence for A. fib or SVT 3. Small bidirectional PFO by TEE 4. Mild CAD - coronary CT angiogram showed mild to moderate coronary disease which was nonobstructive and an intermediate coronary calcium score of 202 (2016) 5. PSVT s/p ablation by Dr. Lovena Le 6. Diabetes type 2 7. Relative dyslipidemia 8. Multiple myeloma 9. Skin cancer  PLAN: 1.   Justin [name] recurrent chest pain.  He underwent CT coronary angiography in 2016 which showed mild to moderate nonobstructive coronary disease and a calcium score of 202.  Although he had no evidence of troponin elevation, this could represent progressive coronary artery disease.  I like to repeat a CT angiogram with FFR and will follow up with him afterwards.  Pixie Casino, MD, [facility], [place] Director of the Advanced Lipid Disorders &  Cardiovascular Risk Reduction Clinic Diplomate of the American Board of Clinical Lipidology Attending Cardiologist  Direct Dial: [phone]  Fax: [phone]  Website:  [email or web address]  Leyna Vanderkolk 10/04/2018, 3:44 PM

## 2018-10-05 DIAGNOSIS — I679 Cerebrovascular disease, unspecified: Secondary | ICD-10-CM | POA: Diagnosis not present

## 2018-10-05 DIAGNOSIS — Z0000 Encounter for general adult medical examination without abnormal findings: Secondary | ICD-10-CM | POA: Diagnosis not present

## 2018-10-05 DIAGNOSIS — I2510 Atherosclerotic heart disease of native coronary artery without angina pectoris: Secondary | ICD-10-CM | POA: Diagnosis not present

## 2018-10-05 DIAGNOSIS — D509 Iron deficiency anemia, unspecified: Secondary | ICD-10-CM | POA: Diagnosis not present

## 2018-10-05 DIAGNOSIS — E1169 Type 2 diabetes mellitus with other specified complication: Secondary | ICD-10-CM | POA: Diagnosis not present

## 2018-10-05 DIAGNOSIS — D61818 Other pancytopenia: Secondary | ICD-10-CM | POA: Diagnosis not present

## 2018-10-05 DIAGNOSIS — I10 Essential (primary) hypertension: Secondary | ICD-10-CM | POA: Diagnosis not present

## 2018-10-05 DIAGNOSIS — Z23 Encounter for immunization: Secondary | ICD-10-CM | POA: Diagnosis not present

## 2018-10-05 DIAGNOSIS — R202 Paresthesia of skin: Secondary | ICD-10-CM | POA: Diagnosis not present

## 2018-10-05 DIAGNOSIS — I4891 Unspecified atrial fibrillation: Secondary | ICD-10-CM | POA: Diagnosis not present

## 2018-10-05 DIAGNOSIS — K219 Gastro-esophageal reflux disease without esophagitis: Secondary | ICD-10-CM | POA: Diagnosis not present

## 2018-10-05 DIAGNOSIS — C9000 Multiple myeloma not having achieved remission: Secondary | ICD-10-CM | POA: Diagnosis not present

## 2018-10-07 ENCOUNTER — Other Ambulatory Visit: Payer: Self-pay | Admitting: Family Medicine

## 2018-10-07 DIAGNOSIS — Z79899 Other long term (current) drug therapy: Secondary | ICD-10-CM

## 2018-10-07 DIAGNOSIS — C9000 Multiple myeloma not having achieved remission: Secondary | ICD-10-CM

## 2018-10-09 ENCOUNTER — Telehealth: Payer: Self-pay | Admitting: *Deleted

## 2018-10-09 NOTE — Telephone Encounter (Signed)
LATE ENTRY PATIENT CAME BY OFFICE TO  LEFT MESSAGE FOR DR HILTY   " CHEST PAIN OFF AND ON DAY AND NIGHT  . HOWEVER I HAVE A LOT OF GAS, BURPING AND  FARTING AND PRESSURE IN CHEST , MAYBE ITS NOT CARDIAC AT ALL. ALSO HAVE LOWER FEET +LEGS TINGLING AND CAUSING ACHES. CAN WE DISCUSS? WE MAYBE ON WRONG TRACK"   RN SHOWED  NOTE TO DR HILTY -  PATIENT IS SCHEDULE FOR CCTA AND FOLLOW APPOINTMENT 11/04/18. PER HILTY - NO CHANGES HAVE TEST THEN APPOINTMENT TO DISCUSS  PATIENT CALLED AND MADE AWARE.

## 2018-10-13 ENCOUNTER — Telehealth: Payer: Self-pay

## 2018-10-13 DIAGNOSIS — H43813 Vitreous degeneration, bilateral: Secondary | ICD-10-CM | POA: Diagnosis not present

## 2018-10-13 DIAGNOSIS — M5136 Other intervertebral disc degeneration, lumbar region: Secondary | ICD-10-CM | POA: Diagnosis not present

## 2018-10-13 DIAGNOSIS — M5416 Radiculopathy, lumbar region: Secondary | ICD-10-CM | POA: Diagnosis not present

## 2018-10-13 DIAGNOSIS — H2513 Age-related nuclear cataract, bilateral: Secondary | ICD-10-CM | POA: Diagnosis not present

## 2018-10-13 DIAGNOSIS — E119 Type 2 diabetes mellitus without complications: Secondary | ICD-10-CM | POA: Diagnosis not present

## 2018-10-13 DIAGNOSIS — H524 Presbyopia: Secondary | ICD-10-CM | POA: Diagnosis not present

## 2018-10-13 NOTE — Telephone Encounter (Signed)
Received fax from [facility] requesting response regarding "squamous cell carcinoma removed from right arm down to wrist and puppy scratch," as reported 08/25/18. Pt has since seen Dr. Irene Limbo for visit 10/01/18. Continuing on revlimid at this time. Per MD note, aware of recurrent SCC. Call to [facility] [phone]. LVM regarding visit and MD awareness of SCC and that pt to continue on revlimid. Call back number provided for any further questions.

## 2018-10-14 DIAGNOSIS — M5416 Radiculopathy, lumbar region: Secondary | ICD-10-CM | POA: Diagnosis not present

## 2018-10-16 ENCOUNTER — Ambulatory Visit (HOSPITAL_COMMUNITY)
Admission: RE | Admit: 2018-10-16 | Discharge: 2018-10-16 | Disposition: A | Discharge status: Home / Self Care | Payer: Medicare Other | Source: Ambulatory Visit | Attending: Cardiology | Admitting: Cardiology

## 2018-10-16 DIAGNOSIS — I2510 Atherosclerotic heart disease of native coronary artery without angina pectoris: Secondary | ICD-10-CM

## 2018-10-16 DIAGNOSIS — I700 Atherosclerosis of aorta: Secondary | ICD-10-CM | POA: Diagnosis not present

## 2018-10-16 DIAGNOSIS — R079 Chest pain, unspecified: Secondary | ICD-10-CM

## 2018-10-16 MED ORDER — IOPAMIDOL (ISOVUE-370) INJECTION 76%
100.0000 mL | Freq: Once | INTRAVENOUS | Status: AC | PRN
  Administered 2018-10-16: 100 mL via INTRAVENOUS

## 2018-10-16 MED ORDER — METOPROLOL TARTRATE 5 MG/5ML IV SOLN
INTRAVENOUS | Status: AC
  Filled 2018-10-16: qty 25

## 2018-10-16 MED ORDER — METOPROLOL TARTRATE 5 MG/5ML IV SOLN
5.0000 mg | INTRAVENOUS | Status: DC | PRN
  Administered 2018-10-16: 5 mg via INTRAVENOUS
  Filled 2018-10-16: qty 5

## 2018-10-16 MED ORDER — NITROGLYCERIN 0.4 MG SL SUBL
SUBLINGUAL_TABLET | SUBLINGUAL | Status: AC
  Filled 2018-10-16: qty 2

## 2018-10-16 MED ORDER — METOPROLOL TARTRATE 5 MG/5ML IV SOLN
10.0000 mg | INTRAVENOUS | Status: DC | PRN
  Administered 2018-10-16: 10 mg via INTRAVENOUS
  Filled 2018-10-16: qty 10

## 2018-10-16 MED ORDER — NITROGLYCERIN 0.4 MG SL SUBL
0.8000 mg | SUBLINGUAL_TABLET | Freq: Once | SUBLINGUAL | Status: AC
  Administered 2018-10-16: 0.8 mg via SUBLINGUAL
  Filled 2018-10-16: qty 25

## 2018-10-16 MED ORDER — IOPAMIDOL (ISOVUE-370) INJECTION 76%
INTRAVENOUS | Status: AC
  Filled 2018-10-16: qty 100

## 2018-10-16 MED ORDER — DILTIAZEM HCL 25 MG/5ML IV SOLN
5.0000 mg | Freq: Once | INTRAVENOUS | Status: DC
  Filled 2018-10-16: qty 5

## 2018-10-19 ENCOUNTER — Ambulatory Visit (INDEPENDENT_AMBULATORY_CARE_PROVIDER_SITE_OTHER): Payer: Medicare Other | Admitting: *Deleted

## 2018-10-19 DIAGNOSIS — Z8673 Personal history of transient ischemic attack (TIA), and cerebral infarction without residual deficits: Secondary | ICD-10-CM

## 2018-10-19 DIAGNOSIS — I10 Essential (primary) hypertension: Secondary | ICD-10-CM

## 2018-10-19 NOTE — Progress Notes (Signed)
Carelink Summary Report / Loop Recorder 

## 2018-10-22 ENCOUNTER — Other Ambulatory Visit: Payer: Medicare Other

## 2018-10-22 ENCOUNTER — Telehealth: Payer: Self-pay

## 2018-10-22 ENCOUNTER — Telehealth: Payer: Self-pay | Admitting: Hematology

## 2018-10-22 NOTE — Telephone Encounter (Signed)
Called pt re appt being moved due to [place] being on pal - left vm for pt with appt details

## 2018-10-22 NOTE — Telephone Encounter (Signed)
-----   Message from Consuelo Pandy, [place] sent at 10/21/2018  7:09 PM EDT ----- Coronary CT did show some plaque in heart arteries but not severe and no significant obstruction of blood flow which is good. he should continue current medications including his statin (Lipitor) to help keep cholesterol under good control to prevent plaque from growing larger and to help stabilize the plaque that is already there to prevent rupture/ heart attacks. He needs to keep his f/u with Dr. Debara Pickett in Nov for further recs.

## 2018-10-22 NOTE — Telephone Encounter (Signed)
Notes recorded by Frederik Schmidt, RN on 10/22/2018 at 9:10 AM EDT The patient has been notified of the result and verbalized understanding. All questions (if any) were answered. Frederik Schmidt, RN 10/22/2018 9:10 AM

## 2018-10-23 ENCOUNTER — Telehealth: Payer: Self-pay

## 2018-10-23 NOTE — Telephone Encounter (Signed)
Left a detailed msg concerning upcoming appointment. Per 11/1 voice mail return call. Mailed a letter with calender enclosed

## 2018-10-27 ENCOUNTER — Telehealth: Payer: Self-pay | Admitting: Internal Medicine

## 2018-10-27 NOTE — Telephone Encounter (Signed)
New Message           Patient is calling to get help with a information form, it sounds like to me that this form could be a surgery clearance. I mentioned that to the patient and the patient states that's what it could be, but not sure.

## 2018-10-27 NOTE — Telephone Encounter (Signed)
Spoke with pt. Pt sts that he will be scheduled have sx by [facility] in the near future. He is completing the pt information form and would like to know the named of the procedure performed by Dr.Taylor a couple of years ago. Adv pt that he did have an ablation for Svt/[place] back in May of 2016. Pt voiced appreciation for the assistance and verbalized understanding.

## 2018-10-28 ENCOUNTER — Ambulatory Visit: Payer: Medicare Other | Admitting: Hematology

## 2018-10-30 DIAGNOSIS — M5117 Intervertebral disc disorders with radiculopathy, lumbosacral region: Secondary | ICD-10-CM | POA: Diagnosis not present

## 2018-10-30 DIAGNOSIS — M5137 Other intervertebral disc degeneration, lumbosacral region: Secondary | ICD-10-CM | POA: Diagnosis not present

## 2018-11-03 ENCOUNTER — Inpatient Hospital Stay: Payer: Medicare Other | Attending: Hematology

## 2018-11-03 DIAGNOSIS — B370 Candidal stomatitis: Secondary | ICD-10-CM | POA: Insufficient documentation

## 2018-11-03 DIAGNOSIS — Z7901 Long term (current) use of anticoagulants: Secondary | ICD-10-CM | POA: Diagnosis not present

## 2018-11-03 DIAGNOSIS — T451X5A Adverse effect of antineoplastic and immunosuppressive drugs, initial encounter: Secondary | ICD-10-CM | POA: Diagnosis not present

## 2018-11-03 DIAGNOSIS — D6481 Anemia due to antineoplastic chemotherapy: Secondary | ICD-10-CM | POA: Diagnosis not present

## 2018-11-03 DIAGNOSIS — D6959 Other secondary thrombocytopenia: Secondary | ICD-10-CM | POA: Diagnosis not present

## 2018-11-03 DIAGNOSIS — E538 Deficiency of other specified B group vitamins: Secondary | ICD-10-CM | POA: Diagnosis not present

## 2018-11-03 DIAGNOSIS — Z8673 Personal history of transient ischemic attack (TIA), and cerebral infarction without residual deficits: Secondary | ICD-10-CM | POA: Diagnosis not present

## 2018-11-03 DIAGNOSIS — I10 Essential (primary) hypertension: Secondary | ICD-10-CM | POA: Diagnosis not present

## 2018-11-03 DIAGNOSIS — I629 Nontraumatic intracranial hemorrhage, unspecified: Secondary | ICD-10-CM | POA: Insufficient documentation

## 2018-11-03 DIAGNOSIS — C9000 Multiple myeloma not having achieved remission: Secondary | ICD-10-CM | POA: Diagnosis not present

## 2018-11-03 DIAGNOSIS — E119 Type 2 diabetes mellitus without complications: Secondary | ICD-10-CM | POA: Diagnosis not present

## 2018-11-03 DIAGNOSIS — I480 Paroxysmal atrial fibrillation: Secondary | ICD-10-CM | POA: Insufficient documentation

## 2018-11-03 DIAGNOSIS — G620 Drug-induced polyneuropathy: Secondary | ICD-10-CM | POA: Diagnosis not present

## 2018-11-03 DIAGNOSIS — C44622 Squamous cell carcinoma of skin of right upper limb, including shoulder: Secondary | ICD-10-CM | POA: Insufficient documentation

## 2018-11-03 LAB — CMP (CANCER CENTER ONLY)
ALK PHOS: 73 U/L (ref 38–126)
ALT: 11 U/L (ref 0–44)
ANION GAP: 11 (ref 5–15)
AST: 20 U/L (ref 15–41)
Albumin: 4.1 g/dL (ref 3.5–5.0)
BILIRUBIN TOTAL: 0.5 mg/dL (ref 0.3–1.2)
BUN: 37 mg/dL — ABNORMAL HIGH (ref 8–23)
CALCIUM: 10.1 mg/dL (ref 8.9–10.3)
CO2: 26 mmol/L (ref 22–32)
Chloride: 102 mmol/L (ref 98–111)
Creatinine: 1.25 mg/dL — ABNORMAL HIGH (ref 0.61–1.24)
GFR, Est AFR Am: >60 mL/min (ref >60)
GFR, Estimated: 53 mL/min — ABNORMAL LOW (ref >60)
Glucose, Bld: 188 mg/dL — ABNORMAL HIGH (ref 70–99)
Potassium: 4.2 mmol/L (ref 3.5–5.1)
Sodium: 139 mmol/L (ref 135–145)
Total Protein: 7.2 g/dL (ref 6.5–8.1)

## 2018-11-03 LAB — CBC WITH DIFFERENTIAL/PLATELET
Abs Immature Granulocytes: 0.01 10*3/uL (ref 0.00–0.07)
Basophils Absolute: 0 10*3/uL (ref 0.0–0.1)
Basophils Relative: 1 %
EOS ABS: 0.1 10*3/uL (ref 0.0–0.5)
EOS PCT: 1 %
HCT: 38.4 % — ABNORMAL LOW (ref 39.0–52.0)
Hemoglobin: 13 g/dL (ref 13.0–17.0)
Immature Granulocytes: 0 %
LYMPHS PCT: 15 %
Lymphs Abs: 0.6 10*3/uL — ABNORMAL LOW (ref 0.7–4.0)
MCH: 31.7 pg (ref 26.0–34.0)
MCHC: 33.9 g/dL (ref 30.0–36.0)
MCV: 93.7 fL (ref 80.0–100.0)
MONO ABS: 0.4 10*3/uL (ref 0.1–1.0)
Monocytes Relative: 10 %
NRBC: 0 % (ref 0.0–0.2)
Neutro Abs: 2.8 10*3/uL (ref 1.7–7.7)
Neutrophils Relative %: 73 %
Platelets: 183 10*3/uL (ref 150–400)
RBC: 4.1 MIL/uL — AB (ref 4.22–5.81)
RDW: 13.5 % (ref 11.5–15.5)
WBC: 3.9 10*3/uL — AB (ref 4.0–10.5)

## 2018-11-04 ENCOUNTER — Encounter: Payer: Self-pay | Admitting: Internal Medicine

## 2018-11-04 ENCOUNTER — Ambulatory Visit (INDEPENDENT_AMBULATORY_CARE_PROVIDER_SITE_OTHER): Payer: Medicare Other | Admitting: Internal Medicine

## 2018-11-04 VITALS — BP 109/66 | HR 95 | Ht 68.0 in | Wt 168.4 lb

## 2018-11-04 DIAGNOSIS — R143 Flatulence: Secondary | ICD-10-CM | POA: Diagnosis not present

## 2018-11-04 DIAGNOSIS — R0789 Other chest pain: Secondary | ICD-10-CM | POA: Diagnosis not present

## 2018-11-04 DIAGNOSIS — R142 Eructation: Secondary | ICD-10-CM | POA: Diagnosis not present

## 2018-11-04 DIAGNOSIS — I639 Cerebral infarction, unspecified: Secondary | ICD-10-CM

## 2018-11-04 NOTE — Patient Instructions (Signed)
Medication Instructions:  Continue current medications If you need a refill on your cardiac medications before your next appointment, please call your pharmacy.    Follow-Up: At [facility], you and your health needs are our priority.  As part of our continuing mission to provide you with exceptional heart care, we have created designated Provider Care Teams.  These Care Teams include your primary Cardiologist (physician) and Advanced Practice Providers (APPs -  Physician Assistants and Nurse Practitioners) who all work together to provide you with the care you need, when you need it. You will need a follow up appointment in 12 months.  Please call our office 2 months in advance to schedule this appointment.  You may see Pixie Casino, MD or one of the following Advanced Practice Providers on your designated Care Team: [place], [place] . Fabian Sharp, PA-C  Any Other Special Instructions Will Be Listed Below (If Applicable).  You have been referred to Dr. Carol Ada - gastroenterologist

## 2018-11-04 NOTE — Progress Notes (Signed)
OFFICE NOTE  Chief Complaint:  Follow-up stress test  Primary Care Physician: Justin Neer, MD  HPI:  Justin [name] is a pleasant 77 year old male who I met recently in the hospital. He presented with chest pain that he developed while sitting his computer. The symptoms had some typical and atypical features for angina. He ruled out for MI and underwent nuclear stress testing which was negative for ischemia. During the stress test while exercising he developed an SVT with heart rate that shot up abruptly to 220. This responded to vagal maneuvers and then reoccurred and eventually subsided. He was started on low-dose beta blocker and eventually discharged without any recurrent symptoms. He was placed on a monitor and has follow-up with cardiac electrophysiology to see Dr. Lovena [name] in January. His main concern [name] is to when he can go back to flying duty. 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 He was started on Lipitor which was started the hospital for for coronary disease, however his LDL is only 95. He is a diabetic. His metformin was increased to 500 twice a day which is improved his blood sugars. He recently saw his primary care provider felt that he was doing well.  I saw Justin [name] back in the office [name]. He was referred to Dr. Cristopher [name] for SVT ablation consideration, and this was offered to him. Prior to having this scheduled he developed an episode of chest pain again and that brought him to the hospital yesterday. He was evaluated by Dr. Dorris [name], and had a right upper quadrant ultrasound which was unremarkable. He also had coronary CT angiogram which demonstrated mild to moderate coronary disease and an intermediate coronary calcium score of 202. He has subsequently worn a monitor between 12/21/2015 and 01/18/2015 which showed no recurrent SVT and normal sinus rhythm.  The etiology of his pain episodes is unclear however may be  related to reflux.   Justin [name] [name] [name] for follow-up. He reports he is done very well without any recurrent SVT status post catheter ablation by Dr. Cristopher [name]. He is seeing me primarily for risk factor modification and treatment of asymptomatic coronary artery disease which was seen by coronary artery CT angiography. He was found to have 2 areas of mild coronary artery disease and a calcium score which was moderately elevated at 202. Since then he's been on lipid therapy with Lipitor and takes daily aspirin as well as my card is HCTZ for hypertension. Blood pressure is well-controlled [name] 106/60. He is due for repeat lipid profile and will need a refill on his cholesterol medication. He denies any chest pain or shortness of breath. 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  06/06/2016  Justin [name] [name] [name] for follow-up. He was seen by myself in the hospital after he presented with symptoms of possible TIA. This is following recent hospitalization at [facility] in [place] where he had a stroke. MRI confirmed this although he has had no significant lasting deficit. His initial symptoms were left arm heaviness and numbness. He also subsequently had some right arm tingling but the symptoms were different than his original presentation. This was thought to be TIA. He was evaluated by neurology however repeat imaging failed to show any new deficits. Subsequently he was referred for a TEE which I performed. This demonstrated a small bidirectional PFO. He had a bifid left atrial  appendage without any thrombus. While he did have PFO, it's not clear that this was the etiology of his stroke or TIA. Venous Dopplers of the legs were negative for thrombus. He was changed from aspirin to Plavix. We also recommended and implanted loop recorder which was placed by Dr. Rayann [name]. The thought is that he may be having intermittent atrial fibrillation, as he has a  history of SVT underwent ablation.  06/12/2017  Justin [name] for follow-up. He's had a very eventful year. 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 He's had problems with recurrent skin cancer. He has had no further stroke or TIA and has done well on aspirin and Plavix. Recently his neurologist he discontinued Plavix and is currently only on full dose aspirin. He did have an implanted loop recorder and multiple interrogations have failed to show any recurrent or intermittent atrial fibrillation. He denies any recurrent SVT. This is not been noted as well. Blood pressure is well controlled and he said recent weight loss which is appropriate at this point.  10/01/2018  Justin [name] in follow-up.  Is been over a year since I saw him.  Unfortunately was recently admitted with precordial chest pain last week.  He ruled out for MI and an outpatient echocardiogram was recommended.  Demonstrated LVEF 55 to 60%, mild LVH and grade 1 diastolic dysfunction.  No regional wall motion abnormalities.  Then he reports no recurrent chest pain.  He continues however to have some physical decline.  He was noted to be using a walker [name] which she did not last year.  Partially may be related to stroke, however previously he was flying airplanes 2 years ago.  Denies recurrent palpitations.  Loop recorder interrogation is failed to show recurrent arrhythmia.  11/04/2018  Justin [name] for follow-up.  He underwent CT coronary angiography which showed no obstructive coronary disease and a mild increase in coronary calcium to 229 compared to study in 2016.  I do suspect his chest pain is noncardiac.  He actually now saying that he is having more belching and gas and discomfort after eating which sounds GI in nature.  He does not have a current gastroenterologist.  PMHx:  Past Medical History:  Diagnosis Date  . Anemia   . Arthritis   . 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 Diabetes mellitus without complication ([place])   . GERD (gastroesophageal reflux disease)   . Gout   . History of loop recorder   . Hypertension   . Left leg weakness   . Multiple myeloma ([place]) 09/27/2018  . Paroxysmal SVT (supraventricular tachycardia) ([place])    a. s/p RFCA on 05/01/15  . Stroke [facility]) 2017   TIA and mild stroke; denies stroke symptoms since then     Past Surgical History:  Procedure Laterality Date  . ATRIAL FIBRILLATION ABLATION     Dr. Lovena [name]  . BASAL CELL CARCINOMA EXCISION     off of back  . ELECTROPHYSIOLOGIC STUDY N/A 05/01/2015   Procedure: SVT Ablation;  Surgeon: Evans Lance, MD;  Location: [facility];  Service: Cardiovascular;  Laterality: N/A;  . EP IMPLANTABLE DEVICE N/A 06/04/2016   Procedure: Loop Recorder Insertion;  Surgeon: Thompson Grayer, MD;  Location: [facility];  Service: Cardiovascular;  Laterality: N/A;  . INGUINAL HERNIA REPAIR Bilateral 06/16/2017   Procedure: LAPAROSCOPIC BILATERAL INGUINAL HERNIA REPAIR;  Surgeon:  Clovis Riley, MD;  Location: [facility];  Service: General;  Laterality: Bilateral;  . INSERTION OF MESH Bilateral 06/16/2017   Procedure: INSERTION OF MESH;  Surgeon: Clovis Riley, MD;  Location: [facility];  Service: General;  Laterality: Bilateral;  . MASS EXCISION Right 07/28/2018   Procedure: EXCISION OF RIGHT FOREARM MASS;  Surgeon: Clovis Riley, MD;  Location: [facility];  Service: General;  Laterality: Right;  . SQUAMOUS CELL CARCINOMA EXCISION    . TEE WITHOUT CARDIOVERSION N/A 06/04/2016   Procedure: TRANSESOPHAGEAL ECHOCARDIOGRAM (TEE);  Surgeon: Pixie Casino, MD;  Location: [facility];  Service: Cardiovascular;  Laterality: N/A;    FAMHx:  Family History  Problem Relation Age of Onset  . Stroke Mother   . Hypertension Mother   . Alzheimer's disease Father   . Heart failure Father   . Down syndrome Daughter     SOCHx:   reports that he has never smoked.  He has never used smokeless tobacco. He reports that he does not drink alcohol or use drugs.  ALLERGIES:  Allergies  Allergen Reactions  . No Known Allergies     ROS: Pertinent items noted in HPI and remainder of comprehensive ROS otherwise negative.  HOME MEDS: Current Outpatient Medications  Medication Sig Dispense Refill  . allopurinol (ZYLOPRIM) 300 MG tablet Take 300 mg by mouth daily.     [name] Kitchen atorvastatin (LIPITOR) 40 MG tablet TAKE 1 TABLET EVERY MORNING (Patient taking differently: Take 40 mg by mouth daily. ) 90 tablet 4  . cyanocobalamin 1000 MCG tablet Take 1,000 mcg by mouth daily.    [name] Kitchen dexamethasone (DECADRON) 4 MG tablet TAKE 5 TABLETS (20MG) ONCE WEEKLY ON DAYS 1, 8, 15 AND 21 OF EVERY 28 DAY CYCLE 60 tablet 4  . fluorouracil (EFUDEX) 5 % cream Apply 1 application topically daily as needed (for skin cancer prevention).   0  . gabapentin (NEURONTIN) 100 MG capsule Take 100 mg by mouth 3 (three) times daily.    [name] Kitchen glucose monitoring kit (FREESTYLE) monitoring kit 1 each by Does not apply route as needed for other.    . lenalidomide (REVLIMID) 15 MG capsule Take 1 capsule (15 mg total) by mouth daily. Take for 21 days on, 7 days off, repeat every 28 days. Authorization #[id number] 09/18/2018 21 capsule 0  . metFORMIN (GLUCOPHAGE) 1000 MG tablet Take 1,000 mg by mouth daily with breakfast.    . NINLARO 4 MG capsule TAKE 1 CAPSULE (4MG) BY MOUTH ONCE WEEKLY ON DAYS 1, 8, AND 15, OF EACH 28 DAY CYCLE. TAKE ON AN EMPTY STOMACH 1HR BEFORE OR 2HRS AFTER FOOD 3 capsule 2  . nitroGLYCERIN (NITROSTAT) 0.4 MG SL tablet Place 1 tablet (0.4 mg total) under the tongue every 5 (five) minutes as needed for chest pain (Max 3 doses in 15 mins.). 30 tablet 0  . omeprazole (PRILOSEC) 20 MG capsule Take 20 mg by mouth every morning.     . rivaroxaban (XARELTO) 20 MG TABS tablet Take 1 tablet (20 mg total) by mouth daily with supper. 90 tablet 2  . telmisartan-hydrochlorothiazide (MICARDIS HCT) 80-12.5 MG  tablet Take 1 tablet by mouth daily. 90 tablet 3  . traMADol (ULTRAM) 50 MG tablet Take 1 tablet by mouth as directed.     No current facility-administered medications for this visit.     LABS/IMAGING: Results for orders placed or performed in visit on 11/03/18 (from the past 48 hour(s))  CBC with Differential/Platelet     Status:  Abnormal   Collection Time: 11/03/18 11:53 AM  Result Value Ref Range   WBC 3.9 (L) 4.0 - 10.5 K/uL   RBC 4.10 (L) 4.22 - 5.81 MIL/uL   Hemoglobin 13.0 13.0 - 17.0 g/dL   HCT 38.4 (L) 39.0 - 52.0 %   MCV 93.7 80.0 - 100.0 fL   MCH 31.7 26.0 - 34.0 pg   MCHC 33.9 30.0 - 36.0 g/dL   RDW 13.5 11.5 - 15.5 %   Platelets 183 150 - 400 K/uL   nRBC 0.0 0.0 - 0.2 %   Neutrophils Relative % 73 %   Neutro Abs 2.8 1.7 - 7.7 K/uL   Lymphocytes Relative 15 %   Lymphs Abs 0.6 (L) 0.7 - 4.0 K/uL   Monocytes Relative 10 %   Monocytes Absolute 0.4 0.1 - 1.0 K/uL   Eosinophils Relative 1 %   Eosinophils Absolute 0.1 0.0 - 0.5 K/uL   Basophils Relative 1 %   Basophils Absolute 0.0 0.0 - 0.1 K/uL   Immature Granulocytes 0 %   Abs Immature Granulocytes 0.01 0.00 - 0.07 K/uL    Comment: Performed at [facility], [place] [address] [place], [place] [address]  CMP ([place] only)     Status: Abnormal   Collection Time: 11/03/18 11:53 AM  Result Value Ref Range   Sodium 139 135 - 145 mmol/L   Potassium 4.2 3.5 - 5.1 mmol/L   Chloride 102 98 - 111 mmol/L   CO2 26 22 - 32 mmol/L   Glucose, Bld 188 (H) 70 - 99 mg/dL   BUN 37 (H) 8 - 23 mg/dL   Creatinine 1.25 (H) 0.61 - 1.24 mg/dL   Calcium 10.1 8.9 - 10.3 mg/dL   Total Protein 7.2 6.5 - 8.1 g/dL   Albumin 4.1 3.5 - 5.0 g/dL   AST 20 15 - 41 U/L   ALT 11 0 - 44 U/L   Alkaline Phosphatase 73 38 - 126 U/L   Total Bilirubin 0.5 0.3 - 1.2 mg/dL   GFR, Est Non Af Am 53 (L) >60 mL/min   GFR, Est AFR Am >60 >60 mL/min    Comment: (NOTE) The eGFR has been calculated using the CKD EPI  equation. This calculation has not been validated in all clinical situations. eGFR's persistently <60 mL/min signify possible Chronic Kidney Disease.    Anion gap 11 5 - 15    Comment: Performed at [facility], [address], [place], [place] [address]   No results found.  VITALS: BP 109/66   Pulse 95   Ht _0  (1.727 m)   Wt 168 lb 6.4 oz (76.4 kg)   SpO2 99%   BMI 25.61 kg/m   EXAM: Deferred  EKG: Deferred  ASSESSMENT: 1. Chest pain -nonobstructive coronary artery disease by cardiac CT, calcium score 229 (09/2018) 2. Stroke and TIA, status post ILR - no evidence for A. fib or SVT 3. Small bidirectional PFO by TEE 4. Mild CAD - coronary CT angiogram showed mild to moderate coronary disease which was nonobstructive and an intermediate coronary calcium score of 202 (2016) 5. PSVT s/p ablation by Dr. Lovena [name] 6. Diabetes type 2 7. Relative dyslipidemia 8. Multiple myeloma 9. Skin cancer  PLAN: 1.   Justin [name] is still having chest discomfort however CT coronary angiography showed no significant change in his anatomy.  His symptoms sound more GI in nature.  We will refer him to Dr. Benson Norway with GI for  further evaluation of his flatulence and eructation.  I would recommend restarting metformin and he is statin since he noticed no change in his symptoms off of these medications.  Follow-up annually or sooner as necessary.  Pixie Casino, MD, [facility], [place] Director of the Advanced Lipid Disorders &  Cardiovascular Risk Reduction Clinic Diplomate of the American Board of Clinical Lipidology Attending Cardiologist  Direct Dial: [phone]  Fax: [phone]  Website:  [email or web address] 11/04/2018, 2:20 PM

## 2018-11-06 LAB — MULTIPLE MYELOMA PANEL, SERUM
ALPHA 1: 0.3 g/dL (ref 0.0–0.4)
ALPHA2 GLOB SERPL ELPH-MCNC: 0.9 g/dL (ref 0.4–1.0)
Albumin SerPl Elph-Mcnc: 3.7 g/dL (ref 2.9–4.4)
Albumin/Glob SerPl: 1.3 (ref 0.7–1.7)
B-GLOBULIN SERPL ELPH-MCNC: 1 g/dL (ref 0.7–1.3)
Gamma Glob SerPl Elph-Mcnc: 0.9 g/dL (ref 0.4–1.8)
Globulin, Total: 3 g/dL (ref 2.2–3.9)
IGG (IMMUNOGLOBIN G), SERUM: 859 mg/dL (ref 700–1600)
IgA: 40 mg/dL — ABNORMAL LOW (ref 61–437)
IgM (Immunoglobulin M), Srm: 40 mg/dL (ref 15–143)
M PROTEIN SERPL ELPH-MCNC: 0.6 g/dL — AB
TOTAL PROTEIN ELP: 6.7 g/dL (ref 6.0–8.5)

## 2018-11-09 DIAGNOSIS — K219 Gastro-esophageal reflux disease without esophagitis: Secondary | ICD-10-CM | POA: Diagnosis not present

## 2018-11-09 DIAGNOSIS — R0781 Pleurodynia: Secondary | ICD-10-CM | POA: Diagnosis not present

## 2018-11-09 DIAGNOSIS — I10 Essential (primary) hypertension: Secondary | ICD-10-CM | POA: Diagnosis not present

## 2018-11-10 NOTE — Progress Notes (Signed)
Justin [name]    HEMATOLOGY/ONCOLOGY CLINIC NOTE  Date of Service: 11/11/2018    Patient Care Team: Mayra Neer, MD as PCP - General (Family Medicine) Debara Pickett Nadean Corwin, MD as PCP - Cardiology (Cardiology)  CHIEF COMPLAINTS/:   F/u for continued mx of Myeloma  HISTORY OF PRESENTING ILLNESS:   Justin [name] is a wonderful 77 y.o. 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  Patient has a history of hypertension, diabetes, GERD, gout, squamous cell skin cancers, elevated PSA, SVT status post ablation in May 2016, chronic back pain who has a history of recurrent CVA/TIA's. He apparently had a left hemispheric TIA June 2017 and has had recurrent) subcortical infarcts thought to be related to small vessel disease. He was noted to have a small PFO that was of questionable significance. He was initially on aspirin and then continued and aspirin + plavix for secondary stroke prevention. He follows with Dr. Antony Contras for his neurology cares.  An SPEP was done due to elevated total proteins of 8.9 and showed an M spike of 2 g/dL. He was also noted to have an elevated total protein of 8.6 in April 2017. He was referred to [place] for further evaluation of his M spike. Blood test did not reveal any hypercalcemia, no significant renal failure. He has been noted to have developed anemia since June the serum and his hemoglobin was 12.6 and is now down to the mid 10 range. He notes no focal bone pains at this time. Overt acute weight loss. No fevers no chills no night sweats.  CURRENT THERAPY:    Ixazomib+ Revlimid + Dexamethasone  INTERVAL HISTORY   Justin [name] presents to the office today for follow-up of his myeloma. The patient's last visit with [place] was on 10/01/18. The pt reports that he is doing well overall.   The pt reports that he was increased to 25m Gabapentin three times a day after worsening neuropathy, which has helped. The pt  notes that his neuropathy is bilateral in his lower extremities but is worse on the left. He notes numbness in the pads of his feet.  The pt also received a steroid injection in his back in the interim to address the radiculopathy component, and notes that this has improved his pain.   He was also able to see cardiology in the interim and was given a dose pack of Medrol. He continues burping. He notes that he feels some muscle straining in his upper abdomen/ lower chest.   Of note since the patient's last visit, pt has had a CT Coronary Morph w/CTA completed on 10/16/18 with results revealing Coronary artery calcium score 299 Agatston units, this places the patient in the 47th percentile for age and gender, suggesting intermediate risk for future cardiac events. 2. Nonobstructive coronary disease.  Lab results (11/03/18) of CBC w/diff, CMP is as follows: all values are WNL except for WBC at 3.9k, RBC at 4.10, HCT at 38.4, Lymphs abs at 600, Glucose at 188, BUN at 37, Creatinine at 1.25, GFR at 53. 11/03/18 MMP revealed all values WNL except for IgA at 40, M Protein at 0.6  On review of systems, pt reports neuropathy in feet, good energy levels, and denies fevers, chills, night sweats, new bone pains, unexpected weight loss, lower abdominal pains, leg swelling, and any other symptoms.   MEDICAL HISTORY:  Past Medical History:  Diagnosis Date  . Anemia   . Arthritis   .  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 Diabetes mellitus without complication ([place])   . GERD (gastroesophageal reflux disease)   . Gout   . History of loop recorder   . Hypertension   . Left leg weakness   . Multiple myeloma ([place]) 09/27/2018  . Paroxysmal SVT (supraventricular tachycardia) ([place])    a. s/p RFCA on 05/01/15  . Stroke [facility]) 2017   TIA and mild stroke; denies stroke symptoms since then     SURGICAL HISTORY: Past Surgical History:  Procedure Laterality Date  . ATRIAL  FIBRILLATION ABLATION     Dr. Lovena Le  . BASAL CELL CARCINOMA EXCISION     off of back  . ELECTROPHYSIOLOGIC STUDY N/A 05/01/2015   Procedure: SVT Ablation;  Surgeon: Evans Lance, MD;  Location: [place] CV LAB;  Service: Cardiovascular;  Laterality: N/A;  . EP IMPLANTABLE DEVICE N/A 06/04/2016   Procedure: Loop Recorder Insertion;  Surgeon: Thompson Grayer, MD;  Location: [place] CV LAB;  Service: Cardiovascular;  Laterality: N/A;  . INGUINAL HERNIA REPAIR Bilateral 06/16/2017   Procedure: LAPAROSCOPIC BILATERAL INGUINAL HERNIA REPAIR;  Surgeon: Clovis Riley, MD;  Location: [place];  Service: General;  Laterality: Bilateral;  . INSERTION OF MESH Bilateral 06/16/2017   Procedure: INSERTION OF MESH;  Surgeon: Clovis Riley, MD;  Location: [place];  Service: General;  Laterality: Bilateral;  . MASS EXCISION Right 07/28/2018   Procedure: EXCISION OF RIGHT FOREARM MASS;  Surgeon: Clovis Riley, MD;  Location: [facility];  Service: General;  Laterality: Right;  . SQUAMOUS CELL CARCINOMA EXCISION    . TEE WITHOUT CARDIOVERSION N/A 06/04/2016   Procedure: TRANSESOPHAGEAL ECHOCARDIOGRAM (TEE);  Surgeon: Pixie Casino, MD;  Location: [facility]COPY;  Service: Cardiovascular;  Laterality: N/A;    SOCIAL HISTORY: Social History   Socioeconomic History  . Marital status: Married    Spouse name: Not on file  . Number of children: Not on file  . Years of education: Not on file  . Highest education level: Not on file  Occupational History  . Not on file  Social Needs  . Financial resource strain: Not on file  . Food insecurity:    Worry: Not on file    Inability: Not on file  . Transportation needs:    Medical: Not on file    Non-medical: Not on file  Tobacco Use  . Smoking status: Never Smoker  . Smokeless tobacco: Never Used  Substance and Sexual Activity  . Alcohol use: No  . Drug use: No  . Sexual activity: Not on file  Lifestyle  . Physical activity:    Days per week: Not on  file    Minutes per session: Not on file  . Stress: Not on file  Relationships  . Social connections:    Talks on phone: Not on file    Gets together: Not on file    Attends religious service: Not on file    Active member of club or organization: Not on file    Attends meetings of clubs or organizations: Not on file    Relationship status: Not on file  . Intimate partner violence:    Fear of current or ex partner: Not on file    Emotionally abused: Not on file    Physically abused: Not on file    Forced sexual activity: Not on file  Other Topics Concern  . Not on file  Social History  Narrative  . Not on file    FAMILY HISTORY: Family History  Problem Relation Age of Onset  . Stroke Mother   . Hypertension Mother   . Alzheimer's disease Father   . Heart failure Father   . Down syndrome Daughter     ALLERGIES:  is allergic to no known allergies.  MEDICATIONS:  Current Outpatient Medications  Medication Sig Dispense Refill  . allopurinol (ZYLOPRIM) 300 MG tablet Take 300 mg by mouth daily.     Justin [name] atorvastatin (LIPITOR) 40 MG tablet TAKE 1 TABLET EVERY MORNING (Patient taking differently: Take 40 mg by mouth daily. ) 90 tablet 4  . cyanocobalamin 1000 MCG tablet Take 1,000 mcg by mouth daily.    . fluorouracil (EFUDEX) 5 % cream Apply 1 application topically daily as needed (for skin cancer prevention).   0  . gabapentin (NEURONTIN) 100 MG capsule Take 100 mg by mouth 3 (three) times daily.    Justin [name] glucose monitoring kit (FREESTYLE) monitoring kit 1 each by Does not apply route as needed for other.    . lenalidomide (REVLIMID) 10 MG capsule Take 1 capsule (10 mg total) by mouth daily. Take for 21 days on, 7 days off, repeat every 28 days. Authorization #[id number] 09/18/2018 21 capsule 3  . metFORMIN (GLUCOPHAGE) 1000 MG tablet Take 1,000 mg by mouth daily with breakfast.    . nitroGLYCERIN (NITROSTAT) 0.4 MG SL tablet Place 1 tablet (0.4 mg total) under the tongue every 5 (five)  minutes as needed for chest pain (Max 3 doses in 15 mins.). 30 tablet 0  . omeprazole (PRILOSEC) 20 MG capsule Take 20 mg by mouth every morning.     . rivaroxaban (XARELTO) 20 MG TABS tablet Take 1 tablet (20 mg total) by mouth daily with supper. 90 tablet 2  . telmisartan-hydrochlorothiazide (MICARDIS HCT) 80-12.5 MG tablet Take 1 tablet by mouth daily. 90 tablet 3  . traMADol (ULTRAM) 50 MG tablet Take 1 tablet by mouth as directed.     No current facility-administered medications for this visit.     REVIEW OF SYSTEMS:    A 10+ POINT REVIEW OF SYSTEMS WAS OBTAINED including neurology, dermatology, psychiatry, cardiac, respiratory, lymph, extremities, GI, GU, Musculoskeletal, constitutional, breasts, reproductive, HEENT.  All pertinent positives are noted in the HPI.  All others are negative.   PHYSICAL EXAMINATION:   ECOG PERFORMANCE STATUS: 2 - Symptomatic, <50% confined to bed  Vitals:   11/11/18 1029  BP: 116/68  Pulse: 73  Resp: 18  Temp: 97.8 F (36.6 C)  SpO2: 98%   Filed Weights   11/11/18 1029  Weight: 168 lb 11.2 oz (76.5 kg)   Body mass index is 25.65 kg/m.  GENERAL:alert, in no acute distress and comfortable SKIN: no acute rashes, no significant lesions EYES: conjunctiva are pink and non-injected, sclera anicteric OROPHARYNX: MMM, no exudates, no oropharyngeal erythema or ulceration NECK: supple, no JVD LYMPH:  no palpable lymphadenopathy in the cervical, axillary or inguinal regions LUNGS: clear to auscultation b/l with normal respiratory effort HEART: regular rate & rhythm ABDOMEN:  normoactive bowel sounds , non tender, not distended. No palpable hepatosplenomegaly.  Extremity: no pedal edema PSYCH: alert & oriented x 3 with fluent speech NEURO: no focal motor/sensory deficits    LABORATORY DATA:  I have reviewed the data as listed. CBC Latest Ref Rng & Units 11/03/2018 09/27/2018 09/25/2018  WBC 4.0 - 10.5 K/uL 3.9(L) 3.3(L) 2.9(L)  Hemoglobin 13.0 -  17.0 g/dL 13.0 12.4(L) 11.3(L)  Hematocrit  39.0 - 52.0 % 38.4(L) 38.8(L) 33.9(L)  Platelets 150 - 400 K/uL 183 164 159  ANC 2.0K  CMP Latest Ref Rng & Units 11/03/2018 09/27/2018 09/25/2018  Glucose 70 - 99 mg/dL 188(H) 140(H) 180(H)  BUN 8 - 23 mg/dL 37(H) 19 27(H)  Creatinine 0.61 - 1.24 mg/dL 1.25(H) 0.96 1.13  Sodium 135 - 145 mmol/L 139 138 142  Potassium 3.5 - 5.1 mmol/L 4.2 3.7 4.1  Chloride 98 - 111 mmol/L 102 104 106  CO2 22 - 32 mmol/L 26 24 27   Calcium 8.9 - 10.3 mg/dL 10.1 10.1 9.8  Total Protein 6.5 - 8.1 g/dL 7.2 - 6.6  Total Bilirubin 0.3 - 1.2 mg/dL 0.5 - 0.4  Alkaline Phos 38 - 126 U/L 73 - 63  AST 15 - 41 U/L 20 - 20  ALT 0 - 44 U/L 11 - 9    Lab Results  Component Value Date   IRON 45 06/02/2017   TIBC 233 06/02/2017   IRONPCTSAT 19 (L) 06/02/2017   (Iron and TIBC)  Lab Results  Component Value Date   FERRITIN 308 08/26/2017         RADIOGRAPHIC STUDIES: I have personally reviewed the radiological images as listed and agreed with the findings in the report.       Bone Marrow Biopsy 12/24/2016 (Accession [id number])  Diagnosis Bone Marrow, Aspirate,Biopsy, and Clot, left iliac crest - HYPERCELLULAR BONE MARROW FOR AGE WITH PLASMA CELL NEOPLASM. - SEE COMMENT. PERIPHERAL BLOOD: - NORMOCYTIC-NORMOCHROMIC ANEMIA. - LEUKOPENIA.  DG Bone Survey Met (Accession [id number]) (Order [id number])  Imaging  Date: 11/28/2016 Department: [facility] Released By: Pricilla Riffle Authorizing: Brunetta Genera, MD  Exam Information   Status Exam Begun  Exam Ended   Final [99] 11/28/2016 11:59 AM 11/28/2016 12:36 PM  PACS Images   Show images for DG Bone Survey Met  Study Result   CLINICAL DATA:  Monoclonal gammopathy of unknown significance. History of squamous cell carcinoma excision, basal cell carcinoma excision.  EXAM: METASTATIC BONE SURVEY  COMPARISON:  CT neck, chest, abdomen and pelvis from  10/29/16, MRI of the head from 06/01/2016, CXR 10/27/2016, lumbar spine radiographs 06/24/2016  FINDINGS: Lateral skull: Small occipital lucency which may represent a normal arachnoid granulation posteriorly in the occiput. No definite lytic abnormality.  Cervical spine AP and lateral: Disc space narrowing at C2-3, and from C4 through C7. C4-5, C5-6 and C6-7 uncovertebral joint spurring bilaterally. No lytic abnormality.  Thoracic spine AP and lateral: T8-9 right-sided osteophytes and to a lesser degree T9-10. No lytic abnormality. Slight multilevel lumbar thoracic disc space narrowing likely degenerative.  Lumbar spine AP and lateral: Disc space narrowing at L4-5 and to a greater extent L5-S1. No lytic disease. L3 through S1 facet sclerosis.  AP pelvis: Negative for lytic disease.  Bilateral upper and lower extremities shoulders through wrist and from the hips through ankle: Negative for lytic disease. Joint space narrowing of the femorotibial compartments both knees. Subchondral cyst of the right patella.  CXR: Clear lungs. Cardiac implantable monitoring device projects over the left heart. Aortic atherosclerosis. No lytic disease.  IMPRESSION: No findings suspicious for lytic disease.   Electronically Signed   By: Ashley Royalty M.D.   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 (12/2016) Peak M spike 2.6  Cytogenetics and Myeloma  FISH panel.- trisomy 38 Patient has normocytic anemia without any other clear etiology. (>2g/dl lower that lower limit of normal which is 13) which per criteria would place this in the Multiple myeloma category as opposed to Smoldering Multiple myeloma (Borderline criterion)  No overt renal failure or hypercalcemia at this time No focal bone pains though he has significant chronic back pain  related to degenerative disc disease. Bone survey shows no overt lytic lesions.  2) anemia and thrombocytopenia -- related to treatment (Ixazomib + Revlimid)   3) Significant Diarrhea and intermittent nausea 09/04/18 Enteropathogenic E.coli detected -Ordered Levofloxacin   4) Grade 2 Neuropathy -- in b/l feet and halfway up lower leg without pain -Seconadry to [employer] -Will monitor closely and will reduce medication if needed   PLAN: -Continue follow up with Dr. Lyman Bishop in cardiology -Continue Vitamin B12 replacement  -Continue follow up with dermatology for regular skin checks -Recommended that the pt keep his legs well moisturized  -Discussed pt labwork from 11/03/18; M Protein increased to 0.6, anemia resolved with HGB increased to 13.0. Blood counts and chemistries are otherwise stable.  -Discussed the 10/16/18 CT Coronary Morph which revealed Coronary artery calcium score 299 Agatston units, this places the patient in the 47th percentile for age and gender, suggesting intermediate risk for future cardiac events. 2. Nonobstructive coronary disease. -Discussed the option to switch to maintenance 79m Revlimid for 3 weeks on and one week off, as his neuropathy is limiting. The pt prefers this over triple drug therapy.  -Will begin maintenance 166mRevlimid  -Continue Vitamin B complex -Will see the pt back in 2 months       5) Borderline low B12 levels, previously 299 --- on replacement - 04/2018 B12 level now improved to 724 -continue replacement.  6) recurrent CVA - thought to be related to small vessel disease as per neurology. Has a small PFO which could be an additional risk factor as well as his afib  7)  P afib Plan -Continue on Xarelto per cardiology.  8) H/o recurrent SCC 9) Occasional Rash, secondary to SCC 10) Oral Thrush - likely secondary to current antibiotics  Plan  -Right wrist raised indulated lesion with crusting concerning for cutaneous squamous cell  carcinoma seen in clinic on 06/23/18 -Underwent surgical excision on right forearm with Dr. ChRomana Junipern 07/28/18. His surgical pathology showed evidence of Squamous Cell Carcinoma.  11) Lower extremity discomfort - improved, stable. Likely Discogenic pain -US[place] 12/2017 revealed no blood clot -Pt has established care with orthopedist and PT.  12) Patient Active Problem List   Diagnosis Date Noted  . Chest pain 09/27/2018  . Multiple myeloma ([facility]10/05/2018  . History of loop recorder 06/12/2017  . Cryptogenic stroke ([facility]11/05/2016  . Gait abnormality   . History of CVA (cerebrovascular accident)   . History of TIA (transient ischemic attack)   . Benign essential HTN   . Paroxysmal SVT (supraventricular tachycardia) ([place]  . Acute blood loss anemia   . Acute ischemic stroke ([facility]  . CVA (cerebral vascular accident) ([facility]11/03/2016  . History of recent stroke 06/06/2016  . 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 TIA (transient ischemic attack) 06/02/2016  . Numbness 06/01/2016  . Right arm numbness 06/01/2016  . Atherosclerosis of native coronary artery of native heart without angina pectoris 03/20/2016  . Hypertension   . GERD (gastroesophageal reflux disease)   . Gout   . S/P RF ablation operation for arrhythmia 12/13/2014  .  Hyperlipidemia 12/13/2014  . Controlled type 2 diabetes mellitus with complication, without long-term current use of insulin ([place]) 12/07/2014   PLAN -Continue f/u with PCP (blood sugars)   RTC with Dr Irene Limbo with labs in 2 months. Plz schedule labs 1 week prior to clinic visit    All of the patients questions were answered with apparent satisfaction. The patient knows to call the clinic with any problems, questions or concerns.  The total time spent in the appt was 30 minutes and more than 50% was on counseling and direct patient cares.    Sullivan Lone MD [place] AAHIVMS [facility] Hematology/Oncology Physician [facility]  (Office):       [phone] (Work cell):  [phone] (Fax):           [phone]  I, Baldwin [place], am acting as a scribe for Dr. Sullivan Lone.   .I have reviewed the above documentation for accuracy and completeness, and I agree with the above. Brunetta Genera MD

## 2018-11-11 ENCOUNTER — Telehealth: Payer: Self-pay

## 2018-11-11 ENCOUNTER — Inpatient Hospital Stay (HOSPITAL_BASED_OUTPATIENT_CLINIC_OR_DEPARTMENT_OTHER): Payer: Medicare Other | Admitting: Hematology

## 2018-11-11 VITALS — BP 116/68 | HR 73 | Temp 97.8°F | Resp 18 | Ht 68.0 in | Wt 168.7 lb

## 2018-11-11 DIAGNOSIS — D6481 Anemia due to antineoplastic chemotherapy: Secondary | ICD-10-CM

## 2018-11-11 DIAGNOSIS — C9000 Multiple myeloma not having achieved remission: Secondary | ICD-10-CM | POA: Diagnosis not present

## 2018-11-11 DIAGNOSIS — Z7901 Long term (current) use of anticoagulants: Secondary | ICD-10-CM

## 2018-11-11 DIAGNOSIS — Z8673 Personal history of transient ischemic attack (TIA), and cerebral infarction without residual deficits: Secondary | ICD-10-CM

## 2018-11-11 DIAGNOSIS — I480 Paroxysmal atrial fibrillation: Secondary | ICD-10-CM

## 2018-11-11 DIAGNOSIS — D6959 Other secondary thrombocytopenia: Secondary | ICD-10-CM | POA: Diagnosis not present

## 2018-11-11 DIAGNOSIS — E119 Type 2 diabetes mellitus without complications: Secondary | ICD-10-CM | POA: Diagnosis not present

## 2018-11-11 DIAGNOSIS — I639 Cerebral infarction, unspecified: Secondary | ICD-10-CM

## 2018-11-11 DIAGNOSIS — G620 Drug-induced polyneuropathy: Secondary | ICD-10-CM | POA: Diagnosis not present

## 2018-11-11 DIAGNOSIS — E538 Deficiency of other specified B group vitamins: Secondary | ICD-10-CM | POA: Diagnosis not present

## 2018-11-11 DIAGNOSIS — C44622 Squamous cell carcinoma of skin of right upper limb, including shoulder: Secondary | ICD-10-CM

## 2018-11-11 DIAGNOSIS — T451X5A Adverse effect of antineoplastic and immunosuppressive drugs, initial encounter: Secondary | ICD-10-CM

## 2018-11-11 DIAGNOSIS — I10 Essential (primary) hypertension: Secondary | ICD-10-CM

## 2018-11-11 DIAGNOSIS — B370 Candidal stomatitis: Secondary | ICD-10-CM

## 2018-11-11 LAB — CUP PACEART REMOTE DEVICE CHECK
Date Time Interrogation Session: 20191026194015
MDC IDC PG IMPLANT DT: 20170613

## 2018-11-11 MED ORDER — LENALIDOMIDE 10 MG PO CAPS: 10.0000 mg | ORAL_CAPSULE | Freq: Every day | ORAL | 3 refills | Status: DC

## 2018-11-11 NOTE — Telephone Encounter (Signed)
Printed avs and calender of upcoming appointment. Per 11/20 os

## 2018-11-13 DIAGNOSIS — M5416 Radiculopathy, lumbar region: Secondary | ICD-10-CM | POA: Diagnosis not present

## 2018-11-13 DIAGNOSIS — C9000 Multiple myeloma not having achieved remission: Secondary | ICD-10-CM | POA: Diagnosis not present

## 2018-11-23 ENCOUNTER — Ambulatory Visit (INDEPENDENT_AMBULATORY_CARE_PROVIDER_SITE_OTHER): Payer: Medicare Other

## 2018-11-23 DIAGNOSIS — I639 Cerebral infarction, unspecified: Secondary | ICD-10-CM

## 2018-11-23 NOTE — Progress Notes (Signed)
Carelink Summary Report / Loop Recorder 

## 2018-11-30 ENCOUNTER — Other Ambulatory Visit: Payer: Self-pay | Admitting: *Deleted

## 2018-11-30 DIAGNOSIS — C9000 Multiple myeloma not having achieved remission: Secondary | ICD-10-CM

## 2018-11-30 MED ORDER — LENALIDOMIDE 10 MG PO CAPS: 10.0000 mg | ORAL_CAPSULE | Freq: Every day | ORAL | 0 refills | Status: DC

## 2018-11-30 NOTE — Telephone Encounter (Signed)
Patient wanted to ask about Revlimid 10 mg as ordered by MD on 11/20. Stated he called [facility] and they had not received the RX. Medication was sent to [facility] on 11/20 by Dr. Irene Limbo. RE sent medication to [facility]. Informed patient medication was sent to [facility] and he should expect delivery in next week.[name] Kitchen

## 2018-12-08 ENCOUNTER — Ambulatory Visit
Admission: RE | Admit: 2018-12-08 | Discharge: 2018-12-08 | Disposition: A | Discharge status: Home / Self Care | Payer: Medicare Other | Source: Ambulatory Visit | Attending: Family Medicine | Admitting: Family Medicine

## 2018-12-08 DIAGNOSIS — C9000 Multiple myeloma not having achieved remission: Secondary | ICD-10-CM

## 2018-12-08 DIAGNOSIS — M810 Age-related osteoporosis without current pathological fracture: Secondary | ICD-10-CM | POA: Diagnosis not present

## 2018-12-08 DIAGNOSIS — Z79899 Other long term (current) drug therapy: Secondary | ICD-10-CM

## 2018-12-18 DIAGNOSIS — M5416 Radiculopathy, lumbar region: Secondary | ICD-10-CM | POA: Diagnosis not present

## 2018-12-18 DIAGNOSIS — M5136 Other intervertebral disc degeneration, lumbar region: Secondary | ICD-10-CM | POA: Diagnosis not present

## 2018-12-21 DIAGNOSIS — C9000 Multiple myeloma not having achieved remission: Secondary | ICD-10-CM | POA: Diagnosis not present

## 2018-12-21 DIAGNOSIS — K219 Gastro-esophageal reflux disease without esophagitis: Secondary | ICD-10-CM | POA: Diagnosis not present

## 2018-12-21 DIAGNOSIS — E119 Type 2 diabetes mellitus without complications: Secondary | ICD-10-CM | POA: Diagnosis not present

## 2018-12-21 DIAGNOSIS — R0781 Pleurodynia: Secondary | ICD-10-CM | POA: Diagnosis not present

## 2018-12-21 LAB — CUP PACEART REMOTE DEVICE CHECK
Date Time Interrogation Session: 20191128193559
Implantable Pulse Generator Implant Date: 20170613

## 2018-12-22 ENCOUNTER — Ambulatory Visit (INDEPENDENT_AMBULATORY_CARE_PROVIDER_SITE_OTHER): Payer: Medicare Other

## 2018-12-22 DIAGNOSIS — I639 Cerebral infarction, unspecified: Secondary | ICD-10-CM

## 2018-12-22 LAB — CUP PACEART REMOTE DEVICE CHECK
Date Time Interrogation Session: 20191128193559
Date Time Interrogation Session: 20191231191012
MDC IDC PG IMPLANT DT: 20170613
MDC IDC PG IMPLANT DT: 20170613

## 2018-12-24 NOTE — Progress Notes (Signed)
Carelink Summary Report / Loop Recorder 

## 2018-12-29 ENCOUNTER — Other Ambulatory Visit: Payer: Self-pay | Admitting: *Deleted

## 2018-12-29 DIAGNOSIS — C9000 Multiple myeloma not having achieved remission: Secondary | ICD-10-CM

## 2018-12-29 MED ORDER — LENALIDOMIDE 10 MG PO CAPS: 10.0000 mg | ORAL_CAPSULE | Freq: Every day | ORAL | 0 refills | Status: DC

## 2018-12-30 ENCOUNTER — Inpatient Hospital Stay: Payer: Medicare Other | Attending: Hematology

## 2018-12-30 DIAGNOSIS — C9000 Multiple myeloma not having achieved remission: Secondary | ICD-10-CM | POA: Diagnosis not present

## 2018-12-30 DIAGNOSIS — Z79899 Other long term (current) drug therapy: Secondary | ICD-10-CM | POA: Insufficient documentation

## 2018-12-30 DIAGNOSIS — D649 Anemia, unspecified: Secondary | ICD-10-CM | POA: Diagnosis not present

## 2018-12-30 DIAGNOSIS — D696 Thrombocytopenia, unspecified: Secondary | ICD-10-CM | POA: Insufficient documentation

## 2018-12-30 DIAGNOSIS — I10 Essential (primary) hypertension: Secondary | ICD-10-CM | POA: Diagnosis not present

## 2018-12-30 DIAGNOSIS — E538 Deficiency of other specified B group vitamins: Secondary | ICD-10-CM

## 2018-12-30 LAB — CMP (CANCER CENTER ONLY)
ALBUMIN: 4 g/dL (ref 3.5–5.0)
ALK PHOS: 75 U/L (ref 38–126)
ALT: 10 U/L (ref 0–44)
ANION GAP: 9 (ref 5–15)
AST: 19 U/L (ref 15–41)
BILIRUBIN TOTAL: 0.6 mg/dL (ref 0.3–1.2)
BUN: 20 mg/dL (ref 8–23)
CALCIUM: 9.7 mg/dL (ref 8.9–10.3)
CO2: 30 mmol/L (ref 22–32)
CREATININE: 1.05 mg/dL (ref 0.61–1.24)
Chloride: 104 mmol/L (ref 98–111)
GFR, Est AFR Am: >60 mL/min (ref >60)
GFR, Estimated: >60 mL/min (ref >60)
GLUCOSE: 98 mg/dL (ref 70–99)
Potassium: 3.8 mmol/L (ref 3.5–5.1)
Sodium: 143 mmol/L (ref 135–145)
TOTAL PROTEIN: 6.9 g/dL (ref 6.5–8.1)

## 2018-12-30 LAB — CBC WITH DIFFERENTIAL/PLATELET
Abs Immature Granulocytes: 0.03 10*3/uL (ref 0.00–0.07)
Basophils Absolute: 0 10*3/uL (ref 0.0–0.1)
Basophils Relative: 1 %
Eosinophils Absolute: 0.1 10*3/uL (ref 0.0–0.5)
Eosinophils Relative: 2 %
HCT: 34.8 % — ABNORMAL LOW (ref 39.0–52.0)
Hemoglobin: 11.5 g/dL — ABNORMAL LOW (ref 13.0–17.0)
IMMATURE GRANULOCYTES: 1 %
LYMPHS PCT: 13 %
Lymphs Abs: 0.7 10*3/uL (ref 0.7–4.0)
MCH: 31 pg (ref 26.0–34.0)
MCHC: 33 g/dL (ref 30.0–36.0)
MCV: 93.8 fL (ref 80.0–100.0)
Monocytes Absolute: 0.6 10*3/uL (ref 0.1–1.0)
Monocytes Relative: 10 %
Neutro Abs: 4 10*3/uL (ref 1.7–7.7)
Neutrophils Relative %: 73 %
Platelets: 145 10*3/uL — ABNORMAL LOW (ref 150–400)
RBC: 3.71 MIL/uL — ABNORMAL LOW (ref 4.22–5.81)
RDW: 13.3 % (ref 11.5–15.5)
WBC: 5.5 10*3/uL (ref 4.0–10.5)
nRBC: 0 % (ref 0.0–0.2)

## 2018-12-30 LAB — VITAMIN B12: Vitamin B-12: 893 pg/mL (ref 180–914)

## 2019-01-01 ENCOUNTER — Telehealth: Payer: Self-pay | Admitting: *Deleted

## 2019-01-01 DIAGNOSIS — C9000 Multiple myeloma not having achieved remission: Secondary | ICD-10-CM

## 2019-01-01 LAB — MULTIPLE MYELOMA PANEL, SERUM
ALBUMIN SERPL ELPH-MCNC: 3.7 g/dL (ref 2.9–4.4)
ALPHA 1: 0.2 g/dL (ref 0.0–0.4)
ALPHA2 GLOB SERPL ELPH-MCNC: 0.8 g/dL (ref 0.4–1.0)
Albumin/Glob SerPl: 1.4 (ref 0.7–1.7)
B-GLOBULIN SERPL ELPH-MCNC: 0.8 g/dL (ref 0.7–1.3)
GAMMA GLOB SERPL ELPH-MCNC: 0.8 g/dL (ref 0.4–1.8)
GLOBULIN, TOTAL: 2.7 g/dL (ref 2.2–3.9)
IGG (IMMUNOGLOBIN G), SERUM: 859 mg/dL (ref 700–1600)
IgA: 44 mg/dL — ABNORMAL LOW (ref 61–437)
IgM (Immunoglobulin M), Srm: 31 mg/dL (ref 15–143)
M PROTEIN SERPL ELPH-MCNC: 0.4 g/dL — AB
TOTAL PROTEIN ELP: 6.4 g/dL (ref 6.0–8.5)

## 2019-01-01 MED ORDER — LENALIDOMIDE 10 MG PO CAPS: 10.0000 mg | ORAL_CAPSULE | Freq: Every day | ORAL | 0 refills | Status: DC

## 2019-01-01 NOTE — Telephone Encounter (Signed)
Prescription for Revlimid sent to [facility] 12/29/2018 as in past. Received faxed refill request for Revlimid from [place] , [place], [place]. Will resend prescription to [facility].

## 2019-01-04 ENCOUNTER — Other Ambulatory Visit: Payer: Self-pay | Admitting: Family Medicine

## 2019-01-04 DIAGNOSIS — G629 Polyneuropathy, unspecified: Secondary | ICD-10-CM | POA: Diagnosis not present

## 2019-01-04 DIAGNOSIS — C9000 Multiple myeloma not having achieved remission: Secondary | ICD-10-CM | POA: Diagnosis not present

## 2019-01-04 DIAGNOSIS — I2510 Atherosclerotic heart disease of native coronary artery without angina pectoris: Secondary | ICD-10-CM | POA: Diagnosis not present

## 2019-01-04 DIAGNOSIS — E1169 Type 2 diabetes mellitus with other specified complication: Secondary | ICD-10-CM | POA: Diagnosis not present

## 2019-01-04 DIAGNOSIS — M109 Gout, unspecified: Secondary | ICD-10-CM | POA: Diagnosis not present

## 2019-01-04 DIAGNOSIS — I10 Essential (primary) hypertension: Secondary | ICD-10-CM | POA: Diagnosis not present

## 2019-01-04 DIAGNOSIS — R1013 Epigastric pain: Secondary | ICD-10-CM

## 2019-01-04 DIAGNOSIS — D61818 Other pancytopenia: Secondary | ICD-10-CM | POA: Diagnosis not present

## 2019-01-04 DIAGNOSIS — Z7984 Long term (current) use of oral hypoglycemic drugs: Secondary | ICD-10-CM | POA: Diagnosis not present

## 2019-01-05 ENCOUNTER — Ambulatory Visit
Admission: RE | Admit: 2019-01-05 | Discharge: 2019-01-05 | Disposition: A | Discharge status: Home / Self Care | Payer: Medicare Other | Source: Ambulatory Visit | Attending: Family Medicine | Admitting: Family Medicine

## 2019-01-05 DIAGNOSIS — R1013 Epigastric pain: Secondary | ICD-10-CM

## 2019-01-05 DIAGNOSIS — N281 Cyst of kidney, acquired: Secondary | ICD-10-CM | POA: Diagnosis not present

## 2019-01-05 NOTE — Progress Notes (Signed)
Justin [name] Kitchen    HEMATOLOGY/ONCOLOGY CLINIC NOTE  Date of Service: 01/06/2019    Patient Care Team: Mayra Neer, MD as PCP - General (Family Medicine) Debara Pickett Nadean Corwin, MD as PCP - Cardiology (Cardiology)  CHIEF COMPLAINTS/:   F/u for continued mx of Myeloma  HISTORY OF PRESENTING ILLNESS:   Justin [name] is a wonderful 78 y.o. 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  Patient has a history of hypertension, diabetes, GERD, gout, squamous cell skin cancers, elevated PSA, SVT status post ablation in May 2016, chronic back pain who has a history of recurrent CVA/TIA's. He apparently had a left hemispheric TIA June 2017 and has had recurrent) subcortical infarcts thought to be related to small vessel disease. He was noted to have a small PFO that was of questionable significance. He was initially on aspirin and then continued and aspirin + plavix for secondary stroke prevention. He follows with Dr. Antony Contras for his neurology cares.  An SPEP was done due to elevated total proteins of 8.9 and showed an M spike of 2 g/dL. He was also noted to have an elevated total protein of 8.6 in April 2017. He was referred to [place] for further evaluation of his M spike. Blood test did not reveal any hypercalcemia, no significant renal failure. He has been noted to have developed anemia since June the serum and his hemoglobin was 12.6 and is now down to the mid 10 range. He notes no focal bone pains at this time. Overt acute weight loss. No fevers no chills no night sweats.  CURRENT THERAPY:    Revlimid maintenance 13m po 3 weeks on 1 week off.  INTERVAL HISTORY   Justin [name] to the office today for follow-up of his myeloma. The patient's last visit with [name] on 11/11/18. The pt reports that he is doing well overall.   The pt reports that he saw Dr. PCarol Adain GI I the interim for some abdominal pain, and the pt notes that he  was treated with prednisone. He then pursued an [place]dom[place] with his PCP yesterday. He notes that this right sided abdominal pain is "fairly steady."  The pt has continued to experience neuropathy but endorses improvement overall, and continues on 3062mGabapentin TID.  The pt notes that he continues tolerating 1017mevlimid very well at this time. He denies new skin lesions or rashes.   The pt notes that he stopped taking Acyclovir in the interim, and he last took Ninlaro about 3 months ago. He is not inclined to returning to Acyclovir and would like to monitor for signs of rashes.   Lab results (12/30/18) of CBC w/diff and CMP is as follows: all values are WNL except for RBC at 3.71, HGB at 11.5, PLT at 145k. 12/30/18 MMP revealed all values WNL except for IgA at 44, M Protein at 0.4g 12/30/18 Vitamin B12 at 893  On review of systems, pt reports neuropathy, good energy levels, eating well, right sided abdominal pain, and denies concerns for infections, leg swelling, and any other symptoms.   MEDICAL HISTORY:  Past Medical History:  Diagnosis Date  . Anemia   . Arthritis   . 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 Diabetes mellitus without complication ([place] . GERD (gastroesophageal reflux disease)   . Gout   . History of loop recorder   .  Hypertension   . Left leg weakness   . Multiple myeloma ([place]) 09/27/2018  . Paroxysmal SVT (supraventricular tachycardia) ([place])    a. s/p RFCA on 05/01/15  . Stroke [facility]) 2017   TIA and mild stroke; denies stroke symptoms since then     SURGICAL HISTORY: Past Surgical History:  Procedure Laterality Date  . ATRIAL FIBRILLATION ABLATION     Dr. Lovena Le  . BASAL CELL CARCINOMA EXCISION     off of back  . ELECTROPHYSIOLOGIC STUDY N/A 05/01/2015   Procedure: SVT Ablation;  Surgeon: Evans Lance, MD;  Location: [place] [facility];  Service: Cardiovascular;  Laterality: N/A;  . EP IMPLANTABLE DEVICE N/A 06/04/2016    Procedure: Loop Recorder Insertion;  Surgeon: Thompson Grayer, MD;  Location: [place] [facility];  Service: Cardiovascular;  Laterality: N/A;  . INGUINAL HERNIA REPAIR Bilateral 06/16/2017   Procedure: LAPAROSCOPIC BILATERAL INGUINAL HERNIA REPAIR;  Surgeon: Clovis Riley, MD;  Location: [place];  Service: General;  Laterality: Bilateral;  . INSERTION OF MESH Bilateral 06/16/2017   Procedure: INSERTION OF MESH;  Surgeon: Clovis Riley, MD;  Location: [place];  Service: General;  Laterality: Bilateral;  . MASS EXCISION Right 07/28/2018   Procedure: EXCISION OF RIGHT FOREARM MASS;  Surgeon: Clovis Riley, MD;  Location: [facility];  Service: General;  Laterality: Right;  . SQUAMOUS CELL CARCINOMA EXCISION    . TEE WITHOUT CARDIOVERSION N/A 06/04/2016   Procedure: TRANSESOPHAGEAL ECHOCARDIOGRAM (TEE);  Surgeon: Pixie Casino, MD;  Location: [facility];  Service: Cardiovascular;  Laterality: N/A;    SOCIAL HISTORY: Social History   Socioeconomic History  . Marital status: Married    Spouse name: Not on file  . Number of children: Not on file  . Years of education: Not on file  . Highest education level: Not on file  Occupational History  . Not on file  Social Needs  . Financial resource strain: Not on file  . Food insecurity:    Worry: Not on file    Inability: Not on file  . Transportation needs:    Medical: Not on file    Non-medical: Not on file  Tobacco Use  . Smoking status: Never Smoker  . Smokeless tobacco: Never Used  Substance and Sexual Activity  . Alcohol use: No  . Drug use: No  . Sexual activity: Not on file  Lifestyle  . Physical activity:    Days per week: Not on file    Minutes per session: Not on file  . Stress: Not on file  Relationships  . Social connections:    Talks on phone: Not on file    Gets together: Not on file    Attends religious service: Not on file    Active member of club or organization: Not on file    Attends meetings of clubs or  organizations: Not on file    Relationship status: Not on file  . Intimate partner violence:    Fear of current or ex partner: Not on file    Emotionally abused: Not on file    Physically abused: Not on file    Forced sexual activity: Not on file  Other Topics Concern  . Not on file  Social History Narrative  . Not on file    FAMILY HISTORY: Family History  Problem Relation Age of Onset  . Stroke Mother   . Hypertension Mother   . Alzheimer's disease Father   . Heart failure Father   .  Down syndrome Daughter     ALLERGIES:  is allergic to no known allergies.  MEDICATIONS:  Current Outpatient Medications  Medication Sig Dispense Refill  . allopurinol (ZYLOPRIM) 300 MG tablet Take 300 mg by mouth daily.     Justin [name] Kitchen atorvastatin (LIPITOR) 40 MG tablet TAKE 1 TABLET EVERY MORNING (Patient taking differently: Take 40 mg by mouth daily. ) 90 tablet 4  . cyanocobalamin 1000 MCG tablet Take 1,000 mcg by mouth daily.    . fluorouracil (EFUDEX) 5 % cream Apply 1 application topically daily as needed (for skin cancer prevention).   0  . gabapentin (NEURONTIN) 300 MG capsule Take 300 mg by mouth 3 (three) times daily.     Justin [name] Kitchen glucose monitoring kit (FREESTYLE) monitoring kit 1 each by Does not apply route as needed for other.    . lenalidomide (REVLIMID) 10 MG capsule Take 1 capsule (10 mg total) by mouth daily. Take for 21 days on, 7 days off, repeat every 28 days. Authorization #[id number] 12/29/2018 21 capsule 0  . metFORMIN (GLUCOPHAGE) 1000 MG tablet Take 1,000 mg by mouth daily with breakfast.    . nitroGLYCERIN (NITROSTAT) 0.4 MG SL tablet Place 1 tablet (0.4 mg total) under the tongue every 5 (five) minutes as needed for chest pain (Max 3 doses in 15 mins.). 30 tablet 0  . omeprazole (PRILOSEC) 20 MG capsule Take 20 mg by mouth every morning.     . rivaroxaban (XARELTO) 20 MG TABS tablet Take 1 tablet (20 mg total) by mouth daily with supper. 90 tablet 2  . telmisartan-hydrochlorothiazide  (MICARDIS HCT) 80-12.5 MG tablet Take 1 tablet by mouth daily. 90 tablet 3  . traMADol (ULTRAM) 50 MG tablet Take 1 tablet by mouth as directed.     No current facility-administered medications for this visit.     REVIEW OF SYSTEMS:    A 10+ POINT REVIEW OF SYSTEMS WAS OBTAINED including neurology, dermatology, psychiatry, cardiac, respiratory, lymph, extremities, GI, GU, Musculoskeletal, constitutional, breasts, reproductive, HEENT.  All pertinent positives are noted in the HPI.  All others are negative.    PHYSICAL EXAMINATION:   ECOG PERFORMANCE STATUS: 2 - Symptomatic, <50% confined to bed  Vitals:   01/06/19 1103  BP: (!) 116/55  Pulse: (!) 58  Resp: 18  Temp: 97.7 F (36.5 C)  SpO2: 100%   Filed Weights   01/06/19 1103  Weight: 176 lb 6.4 oz (80 kg)   Body mass index is 26.82 kg/m.  GENERAL:alert, in no acute distress and comfortable SKIN: no acute rashes, no significant lesions EYES: conjunctiva are pink and non-injected, sclera anicteric OROPHARYNX: MMM, no exudates, no oropharyngeal erythema or ulceration NECK: supple, no JVD LYMPH:  no palpable lymphadenopathy in the cervical, axillary or inguinal regions LUNGS: clear to auscultation b/l with normal respiratory effort HEART: regular rate & rhythm ABDOMEN:  normoactive bowel sounds , non tender, not distended. No palpable hepatosplenomegaly.  Extremity: no pedal edema PSYCH: alert & oriented x 3 with fluent speech NEURO: no focal motor/sensory deficits    LABORATORY DATA:  I have reviewed the data as listed. CBC Latest Ref Rng & Units 12/30/2018 11/03/2018 09/27/2018  WBC 4.0 - 10.5 K/uL 5.5 3.9(L) 3.3(L)  Hemoglobin 13.0 - 17.0 g/dL 11.5(L) 13.0 12.4(L)  Hematocrit 39.0 - 52.0 % 34.8(L) 38.4(L) 38.8(L)  Platelets 150 - 400 K/uL 145(L) 183 164  ANC 2.0K  CMP Latest Ref Rng & Units 12/30/2018 11/03/2018 09/27/2018  Glucose 70 - 99 mg/dL 98 188(H) 140(H)  BUN  8 - 23 mg/dL 20 37(H) 19  Creatinine 0.61 - 1.24  mg/dL 1.05 1.25(H) 0.96  Sodium 135 - 145 mmol/L 143 139 138  Potassium 3.5 - 5.1 mmol/L 3.8 4.2 3.7  Chloride 98 - 111 mmol/L 104 102 104  CO2 22 - 32 mmol/L 30 26 24   Calcium 8.9 - 10.3 mg/dL 9.7 10.1 10.1  Total Protein 6.5 - 8.1 g/dL 6.9 7.2 -  Total Bilirubin 0.3 - 1.2 mg/dL 0.6 0.5 -  Alkaline Phos 38 - 126 U/L 75 73 -  AST 15 - 41 U/L 19 20 -  ALT 0 - 44 U/L 10 11 -    Lab Results  Component Value Date   IRON 45 06/02/2017   TIBC 233 06/02/2017   IRONPCTSAT 19 (L) 06/02/2017   (Iron and TIBC)  Lab Results  Component Value Date   FERRITIN 308 08/26/2017         RADIOGRAPHIC STUDIES: I have personally reviewed the radiological images as listed and agreed with the findings in the report.       Bone Marrow Biopsy 12/24/2016 (Accession [id number])  Diagnosis Bone Marrow, Aspirate,Biopsy, and Clot, left iliac crest - HYPERCELLULAR BONE MARROW FOR AGE WITH PLASMA CELL NEOPLASM. - SEE COMMENT. PERIPHERAL BLOOD: - NORMOCYTIC-NORMOCHROMIC ANEMIA. - LEUKOPENIA.  DG Bone Survey Met (Accession [id number]) (Order [id number])  Imaging  Date: 11/28/2016 Department: [facility]RAD[facility] Released By: Pricilla Riffle Authorizing: Brunetta Genera, MD  Exam Information   Status Exam Begun  Exam Ended   Final [99] 11/28/2016 11:59 AM 11/28/2016 12:36 PM  PACS Images   Show images for DG Bone Survey Met  Study Result   CLINICAL DATA:  Monoclonal gammopathy of unknown significance. History of squamous cell carcinoma excision, basal cell carcinoma excision.  EXAM: METASTATIC BONE SURVEY  COMPARISON:  CT neck, chest, abdomen and pelvis from 10/29/16, MRI of the head from 06/01/2016, CXR 10/27/2016, lumbar spine radiographs 06/24/2016  FINDINGS: Lateral skull: Small occipital lucency which may represent a normal arachnoid granulation posteriorly in the occiput. No definite lytic abnormality.  Cervical spine AP and lateral: Disc  space narrowing at C2-3, and from C4 through C7. C4-5, C5-6 and C6-7 uncovertebral joint spurring bilaterally. No lytic abnormality.  Thoracic spine AP and lateral: T8-9 right-sided osteophytes and to a lesser degree T9-10. No lytic abnormality. Slight multilevel lumbar thoracic disc space narrowing likely degenerative.  Lumbar spine AP and lateral: Disc space narrowing at L4-5 and to a greater extent L5-S1. No lytic disease. L3 through S1 facet sclerosis.  AP pelvis: Negative for lytic disease.  Bilateral upper and lower extremities shoulders through wrist and from the hips through ankle: Negative for lytic disease. Joint space narrowing of the femorotibial compartments both knees. Subchondral cyst of the right patella.  CXR: Clear lungs. Cardiac implantable monitoring device projects over the left heart. Aortic atherosclerosis. No lytic disease.  IMPRESSION: No findings suspicious for lytic disease.   Electronically Signed   By: Ashley Royalty M.D.   On: 11/28/2016 14:58     ASSESSMENT & PLAN:   78 y.o. 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 (12/2016) Peak M spike 2.6  Cytogenetics and Myeloma FISH panel.- trisomy 11 Patient has normocytic anemia without any other clear etiology. (>2g/dl lower that lower limit of normal which is 13) which per criteria would place this in the Multiple myeloma category as opposed to Smoldering  Multiple myeloma (Borderline criterion)  No overt renal failure or hypercalcemia at this time No focal bone pains though he has significant chronic back pain related to degenerative disc disease. Bone survey shows no overt lytic lesions.  10/16/18 CT Coronary Morph revealed Coronary artery calcium score 299 Agatston units, this places the patient in the 47th percentile for age and gender, suggesting intermediate risk for future cardiac events. 2. Nonobstructive  coronary disease.  2) Anemia and thrombocytopenia -- related to treatment (Revlimid)   3) Significant Diarrhea and intermittent nausea- Resolved 09/04/18 Enteropathogenic E.coli detected -Ordered Levofloxacin   4) Grade 2 Neuropathy -- in b/l feet and halfway up lower leg without pain -Seconadry to Automatic Data -Will monitor closely and will reduce medication if needed  5) Right kidney cyst with nodularity -Reviewed the 01/05/19 US Abdomen which revealed Septated cyst arising from lower pole right kidney with a questionable solid focus within this lesion. This nodular area potentially could represent a small neoplasm developing within this complex cyst. Given this circumstance, pre and post contrast MR of the kidneys is advised to further evaluate. If there is a contraindication to MR, pre and post-contrast CT would be a reasonable alternative to further assess. Study otherwise unremarkable. -Pt will likely need urology referral and MRI, pt would like to discuss this further with his PCP before triggering a referral at this point  PLAN: -Discussed pt labwork from 12/30/18; blood counts are stable, chemistries have normalized. -12/30/18 MMP revealed M Protein decreased to 0.4g -The pt has no prohibitive toxicities from continuing maintenance 20m Revlimid for 3 weeks on and one week off, at this time. -Continue follow up with Dr. KLyman Bishopin cardiology -Continue Vitamin B12 replacement  -Continue follow up with dermatology for regular skin checks -Recommended that the pt keep his legs well moisturized -Continue Vitamin B complex -patient will discuss with PCP about urology referral for renal lesion. -Will see the pt back in 2 months       6) Borderline low B12 levels, previously 299 --- on replacement -  -12/30/18 Vitamin B12 at 893 -continue replacement.   7) recurrent CVA - thought to be related to small vessel disease as per neurology. Has a small PFO which could be an additional risk  factor as well as his afib  8)  P afib Plan -Continue on Xarelto per cardiology.  9) H/o recurrent SCC -Underwent surgical excision on right forearm with Dr. CRomana Juniperon 07/28/18. His surgical pathology showed evidence of Squamous Cell Carcinoma. Plan -no evidence of recurrence locally at the site of surgery at this time.  12) Lower extremity discomfort - improved, stable. Likely Discogenic pain -[place]oreaon 12/2017 revealed no blood clot -Pt has established care with orthopedist and PT.  13) Patient Active Problem List   Diagnosis Date Noted  . Chest pain 09/27/2018  . Multiple myeloma ([place] 09/27/2018  . History of loop recorder 06/12/2017  . Cryptogenic stroke ([place] [employer] 10/28/2016  . Gait abnormality   . History of CVA (cerebrovascular accident)   . History of TIA (transient ischemic attack)   . Benign essential HTN   . Paroxysmal SVT (supraventricular tachycardia) ([place]   . Acute blood loss anemia   . Acute ischemic stroke ([place]   . CVA (cerebral vascular accident) ([place] 10/26/2016  . History of recent stroke 06/06/2016  . 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 TIA (transient ischemic attack) 06/02/2016  . Numbness 06/01/2016  . Right arm numbness 06/01/2016  . Atherosclerosis of native  coronary artery of native heart without angina pectoris 03/20/2016  . Hypertension   . GERD (gastroesophageal reflux disease)   . Gout   . S/P RF ablation operation for arrhythmia 12/13/2014  . Hyperlipidemia 12/13/2014  . Controlled type 2 diabetes mellitus with complication, without long-term current use of insulin ([place]) 12/07/2014   PLAN -Continue f/u with PCP (blood sugars)   RTC with Dr Irene Limbo in 2 months with labs. Please schedule labs 1 week prior to clinic visit   All of the patients questions were answered with apparent satisfaction. The patient knows to call the clinic with any problems, questions or concerns.  The total time spent in the appt was 30 minutes and more  than 50% was on counseling and direct patient cares.    Sullivan Lone MD Meta AAHIVMS [facility] Hematology/Oncology Physician [facility]  (Office):       [phone] (Work cell):  [phone] (Fax):           [phone]  I, Baldwin Jamaica, am acting as a scribe for Dr. Sullivan Lone.   .I have reviewed the above documentation for accuracy and completeness, and I agree with the above. Brunetta Genera MD

## 2019-01-06 ENCOUNTER — Inpatient Hospital Stay (HOSPITAL_BASED_OUTPATIENT_CLINIC_OR_DEPARTMENT_OTHER): Payer: Medicare Other | Admitting: Hematology

## 2019-01-06 ENCOUNTER — Telehealth: Payer: Self-pay

## 2019-01-06 VITALS — BP 116/55 | HR 58 | Temp 97.7°F | Resp 18 | Ht 68.0 in | Wt 176.4 lb

## 2019-01-06 DIAGNOSIS — Z79899 Other long term (current) drug therapy: Secondary | ICD-10-CM | POA: Diagnosis not present

## 2019-01-06 DIAGNOSIS — E538 Deficiency of other specified B group vitamins: Secondary | ICD-10-CM

## 2019-01-06 DIAGNOSIS — D6959 Other secondary thrombocytopenia: Secondary | ICD-10-CM | POA: Diagnosis not present

## 2019-01-06 DIAGNOSIS — N281 Cyst of kidney, acquired: Secondary | ICD-10-CM

## 2019-01-06 DIAGNOSIS — C9000 Multiple myeloma not having achieved remission: Secondary | ICD-10-CM

## 2019-01-06 DIAGNOSIS — D6481 Anemia due to antineoplastic chemotherapy: Secondary | ICD-10-CM | POA: Diagnosis not present

## 2019-01-06 DIAGNOSIS — I480 Paroxysmal atrial fibrillation: Secondary | ICD-10-CM

## 2019-01-06 DIAGNOSIS — G620 Drug-induced polyneuropathy: Secondary | ICD-10-CM

## 2019-01-06 DIAGNOSIS — Z85828 Personal history of other malignant neoplasm of skin: Secondary | ICD-10-CM

## 2019-01-06 DIAGNOSIS — Z8673 Personal history of transient ischemic attack (TIA), and cerebral infarction without residual deficits: Secondary | ICD-10-CM | POA: Diagnosis not present

## 2019-01-06 DIAGNOSIS — T451X5A Adverse effect of antineoplastic and immunosuppressive drugs, initial encounter: Secondary | ICD-10-CM | POA: Diagnosis not present

## 2019-01-06 DIAGNOSIS — I10 Essential (primary) hypertension: Secondary | ICD-10-CM | POA: Diagnosis not present

## 2019-01-06 DIAGNOSIS — D696 Thrombocytopenia, unspecified: Secondary | ICD-10-CM | POA: Diagnosis not present

## 2019-01-06 DIAGNOSIS — D649 Anemia, unspecified: Secondary | ICD-10-CM | POA: Diagnosis not present

## 2019-01-06 DIAGNOSIS — Z7901 Long term (current) use of anticoagulants: Secondary | ICD-10-CM | POA: Diagnosis not present

## 2019-01-06 NOTE — Telephone Encounter (Signed)
Printed avs and calender of upcoming appointment per 11/5 los 

## 2019-01-06 NOTE — Patient Instructions (Signed)
Thank you for choosing [facility] to provide your oncology and hematology care.  To afford each patient quality time with our providers, please arrive 30 minutes before your scheduled appointment time.  If you arrive late for your appointment, you may be asked to reschedule.  We strive to give you quality time with our providers, and arriving late affects you and other patients whose appointments are after yours.    If you are a no show for multiple scheduled visits, you may be dismissed from the clinic at the providers discretion.     Again, thank you for choosing [facility], our hope is that these requests will decrease the amount of time that you wait before being seen by our physicians.  ______________________________________________________________________   Should you have questions after your visit to the [facility], please contact our office at [phone] between the hours of 8:30 and 4:30 p.m.    Voicemails left after 4:30p.m will not be returned until the following business day.     For prescription refill requests, please have your pharmacy contact us directly.  Please also try to allow 48 hours for prescription requests.     Please contact the scheduling department for questions regarding scheduling.  For scheduling of procedures such as PET scans, CT scans, MRI, Ultrasound, etc please contact central scheduling at [phone].     Resources For Cancer Patients and Caregivers:    Oncolink.org:  A wonderful resource for patients and healthcare providers for information regarding your disease, ways to tract your treatment, what to expect, etc.      American Cancer Society:  800-227-2345  Can help patients locate various types of support and financial assistance   Cancer Care: 1-800-813-HOPE (4673) Provides financial assistance, online support groups, medication/co-pay assistance.     [place] DSS:  336-641-3447 Where to apply  for food stamps, Medicaid, and utility assistance   Medicare Rights Center: 800-333-4114 Helps people with Medicare understand their rights and benefits, navigate the Medicare system, and secure the quality healthcare they deserve   SCAT: 336-333-6589 [employer]'s shared-ride transportation service for eligible riders who have a disability that prevents them from riding the fixed route bus.     For additional information on assistance programs please contact our social worker:   Abigail Elmore:  [phone]  

## 2019-01-13 ENCOUNTER — Other Ambulatory Visit: Payer: Self-pay | Admitting: Family Medicine

## 2019-01-13 DIAGNOSIS — N281 Cyst of kidney, acquired: Secondary | ICD-10-CM

## 2019-01-17 ENCOUNTER — Ambulatory Visit
Admission: RE | Admit: 2019-01-17 | Discharge: 2019-01-17 | Disposition: A | Discharge status: Home / Self Care | Payer: Medicare Other | Source: Ambulatory Visit | Attending: Family Medicine | Admitting: Family Medicine

## 2019-01-17 DIAGNOSIS — N281 Cyst of kidney, acquired: Secondary | ICD-10-CM

## 2019-01-18 DIAGNOSIS — M5416 Radiculopathy, lumbar region: Secondary | ICD-10-CM | POA: Diagnosis not present

## 2019-01-18 DIAGNOSIS — M5136 Other intervertebral disc degeneration, lumbar region: Secondary | ICD-10-CM | POA: Diagnosis not present

## 2019-01-25 ENCOUNTER — Ambulatory Visit (INDEPENDENT_AMBULATORY_CARE_PROVIDER_SITE_OTHER): Payer: Medicare Other

## 2019-01-25 DIAGNOSIS — I639 Cerebral infarction, unspecified: Secondary | ICD-10-CM

## 2019-01-26 ENCOUNTER — Inpatient Hospital Stay: Admission: RE | Admit: 2019-01-26 | Payer: Medicare Other | Source: Ambulatory Visit

## 2019-01-27 ENCOUNTER — Other Ambulatory Visit: Payer: Self-pay | Admitting: *Deleted

## 2019-01-27 DIAGNOSIS — C9000 Multiple myeloma not having achieved remission: Secondary | ICD-10-CM

## 2019-01-27 MED ORDER — LENALIDOMIDE 10 MG PO CAPS: 10.0000 mg | ORAL_CAPSULE | Freq: Every day | ORAL | 0 refills | Status: DC

## 2019-01-28 LAB — CUP PACEART REMOTE DEVICE CHECK
Date Time Interrogation Session: 20200202203644
Implantable Pulse Generator Implant Date: 20170613

## 2019-02-02 ENCOUNTER — Ambulatory Visit
Admission: RE | Admit: 2019-02-02 | Discharge: 2019-02-02 | Disposition: A | Discharge status: Home / Self Care | Payer: Medicare Other | Source: Ambulatory Visit | Attending: Family Medicine | Admitting: Family Medicine

## 2019-02-02 ENCOUNTER — Encounter: Payer: Self-pay | Admitting: Hematology

## 2019-02-02 ENCOUNTER — Other Ambulatory Visit: Payer: Self-pay | Admitting: Family Medicine

## 2019-02-02 DIAGNOSIS — N281 Cyst of kidney, acquired: Secondary | ICD-10-CM

## 2019-02-02 DIAGNOSIS — N2889 Other specified disorders of kidney and ureter: Secondary | ICD-10-CM | POA: Diagnosis not present

## 2019-02-02 MED ORDER — GADOBENATE DIMEGLUMINE 529 MG/ML IV SOLN
15.0000 mL | Freq: Once | INTRAVENOUS | Status: AC | PRN
  Administered 2019-02-02: 15 mL via INTRAVENOUS

## 2019-02-02 MED ORDER — GADOBENATE DIMEGLUMINE 529 MG/ML IV SOLN: 15.0000 mL | Freq: Once | INTRAVENOUS | Status: DC | PRN

## 2019-02-02 NOTE — Progress Notes (Signed)
Carelink Summary Report / Loop Recorder 

## 2019-02-23 ENCOUNTER — Other Ambulatory Visit: Payer: Self-pay | Admitting: *Deleted

## 2019-02-23 ENCOUNTER — Telehealth: Payer: Self-pay | Admitting: *Deleted

## 2019-02-23 DIAGNOSIS — C9000 Multiple myeloma not having achieved remission: Secondary | ICD-10-CM

## 2019-02-23 MED ORDER — LENALIDOMIDE 10 MG PO CAPS: 10.0000 mg | ORAL_CAPSULE | Freq: Every day | ORAL | 0 refills | Status: DC

## 2019-02-23 NOTE — Telephone Encounter (Signed)
Per OV note 01/06/2019, refill of Revlimid 10 mg appropriate - sent to [facility], [place]. Auth# [id number] 02/23/2019

## 2019-02-26 ENCOUNTER — Ambulatory Visit (INDEPENDENT_AMBULATORY_CARE_PROVIDER_SITE_OTHER): Payer: Medicare Other | Admitting: *Deleted

## 2019-02-26 DIAGNOSIS — I639 Cerebral infarction, unspecified: Secondary | ICD-10-CM

## 2019-02-27 LAB — CUP PACEART REMOTE DEVICE CHECK
Date Time Interrogation Session: 20200306204232
Implantable Pulse Generator Implant Date: 20170613

## 2019-03-03 ENCOUNTER — Inpatient Hospital Stay: Payer: Medicare Other | Attending: Hematology

## 2019-03-03 ENCOUNTER — Other Ambulatory Visit: Payer: Self-pay

## 2019-03-03 DIAGNOSIS — Z8673 Personal history of transient ischemic attack (TIA), and cerebral infarction without residual deficits: Secondary | ICD-10-CM | POA: Diagnosis not present

## 2019-03-03 DIAGNOSIS — G620 Drug-induced polyneuropathy: Secondary | ICD-10-CM | POA: Insufficient documentation

## 2019-03-03 DIAGNOSIS — N281 Cyst of kidney, acquired: Secondary | ICD-10-CM | POA: Diagnosis not present

## 2019-03-03 DIAGNOSIS — Z7901 Long term (current) use of anticoagulants: Secondary | ICD-10-CM | POA: Diagnosis not present

## 2019-03-03 DIAGNOSIS — M549 Dorsalgia, unspecified: Secondary | ICD-10-CM | POA: Diagnosis not present

## 2019-03-03 DIAGNOSIS — E538 Deficiency of other specified B group vitamins: Secondary | ICD-10-CM | POA: Insufficient documentation

## 2019-03-03 DIAGNOSIS — G8929 Other chronic pain: Secondary | ICD-10-CM | POA: Diagnosis not present

## 2019-03-03 DIAGNOSIS — T451X5A Adverse effect of antineoplastic and immunosuppressive drugs, initial encounter: Secondary | ICD-10-CM | POA: Insufficient documentation

## 2019-03-03 DIAGNOSIS — D6481 Anemia due to antineoplastic chemotherapy: Secondary | ICD-10-CM | POA: Insufficient documentation

## 2019-03-03 DIAGNOSIS — C9000 Multiple myeloma not having achieved remission: Secondary | ICD-10-CM

## 2019-03-03 DIAGNOSIS — D6959 Other secondary thrombocytopenia: Secondary | ICD-10-CM | POA: Diagnosis not present

## 2019-03-03 DIAGNOSIS — Z85828 Personal history of other malignant neoplasm of skin: Secondary | ICD-10-CM | POA: Diagnosis not present

## 2019-03-03 LAB — CMP (CANCER CENTER ONLY)
ALT: 8 U/L (ref 0–44)
AST: 20 U/L (ref 15–41)
Albumin: 3.4 g/dL — ABNORMAL LOW (ref 3.5–5.0)
Alkaline Phosphatase: 86 U/L (ref 38–126)
Anion gap: 10 (ref 5–15)
BILIRUBIN TOTAL: 0.9 mg/dL (ref 0.3–1.2)
BUN: 24 mg/dL — ABNORMAL HIGH (ref 8–23)
CO2: 27 mmol/L (ref 22–32)
CREATININE: 1.25 mg/dL — AB (ref 0.61–1.24)
Calcium: 9 mg/dL (ref 8.9–10.3)
Chloride: 103 mmol/L (ref 98–111)
GFR, Est AFR Am: >60 mL/min (ref >60)
GFR, Estimated: 54 mL/min — ABNORMAL LOW (ref >60)
Glucose, Bld: 112 mg/dL — ABNORMAL HIGH (ref 70–99)
Potassium: 4 mmol/L (ref 3.5–5.1)
Sodium: 140 mmol/L (ref 135–145)
TOTAL PROTEIN: 6.2 g/dL — AB (ref 6.5–8.1)

## 2019-03-03 LAB — CBC WITH DIFFERENTIAL/PLATELET
Abs Immature Granulocytes: 0.01 10*3/uL (ref 0.00–0.07)
BASOS PCT: 1 %
Basophils Absolute: 0 10*3/uL (ref 0.0–0.1)
Eosinophils Absolute: 0.6 10*3/uL — ABNORMAL HIGH (ref 0.0–0.5)
Eosinophils Relative: 21 %
HCT: 30.3 % — ABNORMAL LOW (ref 39.0–52.0)
Hemoglobin: 9.7 g/dL — ABNORMAL LOW (ref 13.0–17.0)
Immature Granulocytes: 0 %
Lymphocytes Relative: 19 %
Lymphs Abs: 0.5 10*3/uL — ABNORMAL LOW (ref 0.7–4.0)
MCH: 30.8 pg (ref 26.0–34.0)
MCHC: 32 g/dL (ref 30.0–36.0)
MCV: 96.2 fL (ref 80.0–100.0)
Monocytes Absolute: 0.3 10*3/uL (ref 0.1–1.0)
Monocytes Relative: 10 %
NRBC: 0 % (ref 0.0–0.2)
Neutro Abs: 1.3 10*3/uL — ABNORMAL LOW (ref 1.7–7.7)
Neutrophils Relative %: 49 %
Platelets: 102 10*3/uL — ABNORMAL LOW (ref 150–400)
RBC: 3.15 MIL/uL — ABNORMAL LOW (ref 4.22–5.81)
RDW: 15 % (ref 11.5–15.5)
WBC: 2.6 10*3/uL — ABNORMAL LOW (ref 4.0–10.5)

## 2019-03-04 LAB — MULTIPLE MYELOMA PANEL, SERUM
Albumin SerPl Elph-Mcnc: 3.1 g/dL (ref 2.9–4.4)
Albumin/Glob SerPl: 1.4 (ref 0.7–1.7)
Alpha 1: 0.2 g/dL (ref 0.0–0.4)
Alpha2 Glob SerPl Elph-Mcnc: 0.7 g/dL (ref 0.4–1.0)
B-GLOBULIN SERPL ELPH-MCNC: 0.7 g/dL (ref 0.7–1.3)
Gamma Glob SerPl Elph-Mcnc: 0.7 g/dL (ref 0.4–1.8)
Globulin, Total: 2.3 g/dL (ref 2.2–3.9)
IgA: 92 mg/dL (ref 61–437)
IgG (Immunoglobin G), Serum: 795 mg/dL (ref 700–1600)
IgM (Immunoglobulin M), Srm: 20 mg/dL (ref 15–143)
M Protein SerPl Elph-Mcnc: 0.2 g/dL — ABNORMAL HIGH
Total Protein ELP: 5.4 g/dL — ABNORMAL LOW (ref 6.0–8.5)

## 2019-03-05 NOTE — Progress Notes (Signed)
Carelink Summary Report / Loop Recorder 

## 2019-03-09 ENCOUNTER — Encounter: Payer: Medicare Other | Admitting: Physician Assistant

## 2019-03-09 NOTE — Progress Notes (Signed)
HEMATOLOGY/ONCOLOGY CLINIC NOTE  Date of Service: 03/10/2019    Patient Care Team: Mayra Neer, MD as PCP - General (Family Medicine) Debara Pickett Nadean Corwin, MD as PCP - Cardiology (Cardiology)  CHIEF COMPLAINTS/:   F/u for continued mx of Myeloma  HISTORY OF PRESENTING ILLNESS:   Justin [name] is a wonderful 78 y.o. 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  Patient has a history of hypertension, diabetes, GERD, gout, squamous cell skin cancers, elevated PSA, SVT status post ablation in May 2016, chronic back pain who has a history of recurrent CVA/TIA's. He apparently had a left hemispheric TIA June 2017 and has had recurrent) subcortical infarcts thought to be related to small vessel disease. He was noted to have a small PFO that was of questionable significance. He was initially on aspirin and then continued and aspirin + plavix for secondary stroke prevention. He follows with Dr. Antony Contras for his neurology cares.  An SPEP was done due to elevated total proteins of 8.9 and showed an M spike of 2 g/dL. He was also noted to have an elevated total protein of 8.6 in April 2017. He was referred to [place] for further evaluation of his M spike. Blood test did not reveal any hypercalcemia, no significant renal failure. He has been noted to have developed anemia since June the serum and his hemoglobin was 12.6 and is now down to the mid 10 range. He notes no focal bone pains at this time. Overt acute weight loss. No fevers no chills no night sweats.  CURRENT THERAPY:    Revlimid maintenance 70m po 3 weeks on 1 week off.  INTERVAL HISTORY   Justin Gurapresents to the office today for follow-up of his myeloma. The patient's last visit with [name] on 01/06/19. The pt reports that he is doing well overall.   The pt reports that he has continued to have stable neuropathy, which is addressed sufficiently with his gabapentin.  He is currently within his off week of his Revlimid. The pt denies any medication changes. He has continued on allopurinol.   The pt denies developing any new concerns. He denies concerns for infections. He denies any new bone pains. The pt denies any dental concerns.  Lab results (03/03/19) of CBC w/diff and CMP is as follows: all values are WNL except for WBC at 2.6k, RBC at 3.15, HGB at 9.7, HCT at 30.3, PLT at 102k, ANC at 1.3k, Lymphs abs at 500, Eosinophils abs at 600, Glucose at 112, BUN at 24, Creatinine at 1.25, Total Protein at 6.2, Albumin at 3.4, GFR at 54. 03/10/19 MMP revealed all values WNL except for Total Protein at 5.4, and M Protein at 0.2g.  On review of systems, pt reports good energy levels, stable neuropathy, and denies concerns for infections, new bone pains, dental concerns, abdominal pains, back pains, and any other symptoms.  MEDICAL HISTORY:  Past Medical History:  Diagnosis Date  . Anemia   . Arthritis   . 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 Diabetes mellitus without complication ([place]   . GERD (gastroesophageal reflux disease)   . Gout   . History of loop recorder   . Hypertension   . Left leg weakness   . Multiple myeloma ([place] 09/27/2018  . Paroxysmal SVT (supraventricular tachycardia) ([place]    a. s/p RFCA on 05/01/15  .  Stroke [facility]) 2017   TIA and mild stroke; denies stroke symptoms since then     SURGICAL HISTORY: Past Surgical History:  Procedure Laterality Date  . ATRIAL FIBRILLATION ABLATION     Dr. Lovena Le  . BASAL CELL CARCINOMA EXCISION     off of back  . ELECTROPHYSIOLOGIC STUDY N/A 05/01/2015   Procedure: SVT Ablation;  Surgeon: Evans Lance, MD;  Location: [place] [facility];  Service: Cardiovascular;  Laterality: N/A;  . EP IMPLANTABLE DEVICE N/A 06/04/2016   Procedure: Loop Recorder Insertion;  Surgeon: Thompson Grayer, MD;  Location: [place] [facility];  Service: Cardiovascular;  Laterality: N/A;  .  INGUINAL HERNIA REPAIR Bilateral 06/16/2017   Procedure: LAPAROSCOPIC BILATERAL INGUINAL HERNIA REPAIR;  Surgeon: Clovis Riley, MD;  Location: [place];  Service: General;  Laterality: Bilateral;  . INSERTION OF MESH Bilateral 06/16/2017   Procedure: INSERTION OF MESH;  Surgeon: Clovis Riley, MD;  Location: [place];  Service: General;  Laterality: Bilateral;  . MASS EXCISION Right 07/28/2018   Procedure: EXCISION OF RIGHT FOREARM MASS;  Surgeon: Clovis Riley, MD;  Location: [facility];  Service: General;  Laterality: Right;  . SQUAMOUS CELL CARCINOMA EXCISION    . TEE WITHOUT CARDIOVERSION N/A 06/04/2016   Procedure: TRANSESOPHAGEAL ECHOCARDIOGRAM (TEE);  Surgeon: Pixie Casino, MD;  Location: [facility];  Service: Cardiovascular;  Laterality: N/A;    SOCIAL HISTORY: Social History   Socioeconomic History  . Marital status: Married    Spouse name: Not on file  . Number of children: Not on file  . Years of education: Not on file  . Highest education level: Not on file  Occupational History  . Not on file  Social Needs  . Financial resource strain: Not on file  . Food insecurity:    Worry: Not on file    Inability: Not on file  . Transportation needs:    Medical: Not on file    Non-medical: Not on file  Tobacco Use  . Smoking status: Never Smoker  . Smokeless tobacco: Never Used  Substance and Sexual Activity  . Alcohol use: No  . Drug use: No  . Sexual activity: Not on file  Lifestyle  . Physical activity:    Days per week: Not on file    Minutes per session: Not on file  . Stress: Not on file  Relationships  . Social connections:    Talks on phone: Not on file    Gets together: Not on file    Attends religious service: Not on file    Active member of club or organization: Not on file    Attends meetings of clubs or organizations: Not on file    Relationship status: Not on file  . Intimate partner violence:    Fear of current or ex partner: Not on file     Emotionally abused: Not on file    Physically abused: Not on file    Forced sexual activity: Not on file  Other Topics Concern  . Not on file  Social History Narrative  . Not on file    FAMILY HISTORY: Family History  Problem Relation Age of Onset  . Stroke Mother   . Hypertension Mother   . Alzheimer's disease Father   . Heart failure Father   . Down syndrome Daughter     ALLERGIES:  is allergic to no known allergies.  MEDICATIONS:  Current Outpatient Medications  Medication Sig Dispense Refill  . allopurinol (ZYLOPRIM)  300 MG tablet Take 300 mg by mouth daily.     [name] Kitchen atorvastatin (LIPITOR) 40 MG tablet TAKE 1 TABLET EVERY MORNING (Patient taking differently: Take 40 mg by mouth daily. ) 90 tablet 4  . cyanocobalamin 1000 MCG tablet Take 1,000 mcg by mouth daily.    . fluorouracil (EFUDEX) 5 % cream Apply 1 application topically daily as needed (for skin cancer prevention).   0  . gabapentin (NEURONTIN) 300 MG capsule Take 300 mg by mouth 3 (three) times daily.     [name] Kitchen glucose monitoring kit (FREESTYLE) monitoring kit 1 each by Does not apply route as needed for other.    . lenalidomide (REVLIMID) 10 MG capsule Take 1 capsule (10 mg total) by mouth daily. Take for 21 days on, 7 days off, repeat every 28 days. Authorization #[id number] 01/27/2019 21 capsule 0  . metFORMIN (GLUCOPHAGE) 1000 MG tablet Take 1,000 mg by mouth daily with breakfast.    . nitroGLYCERIN (NITROSTAT) 0.4 MG SL tablet Place 1 tablet (0.4 mg total) under the tongue every 5 (five) minutes as needed for chest pain (Max 3 doses in 15 mins.). 30 tablet 0  . omeprazole (PRILOSEC) 20 MG capsule Take 20 mg by mouth every morning.     . rivaroxaban (XARELTO) 20 MG TABS tablet Take 1 tablet (20 mg total) by mouth daily with supper. 90 tablet 2  . telmisartan-hydrochlorothiazide (MICARDIS HCT) 80-12.5 MG tablet Take 1 tablet by mouth daily. 90 tablet 3  . traMADol (ULTRAM) 50 MG tablet Take 1 tablet by mouth as directed.      No current facility-administered medications for this visit.     REVIEW OF SYSTEMS:    A 10+ POINT REVIEW OF SYSTEMS WAS OBTAINED including neurology, dermatology, psychiatry, cardiac, respiratory, lymph, extremities, GI, GU, Musculoskeletal, constitutional, breasts, reproductive, HEENT.  All pertinent positives are noted in the HPI.  All others are negative.   PHYSICAL EXAMINATION:   ECOG PERFORMANCE STATUS: 2 - Symptomatic, <50% confined to bed  Vitals:   03/10/19 1337  BP: 110/60  Pulse: 60  Resp: 17  Temp: 98.4 F (36.9 C)  SpO2: 100%   Filed Weights   03/10/19 1337  Weight: 178 lb 6.4 oz (80.9 kg)   Body mass index is 27.13 kg/m.  GENERAL:alert, in no acute distress and comfortable SKIN: no acute rashes, no significant lesions EYES: conjunctiva are pink and non-injected, sclera anicteric OROPHARYNX: MMM, no exudates, no oropharyngeal erythema or ulceration NECK: supple, no JVD LYMPH:  no palpable lymphadenopathy in the cervical, axillary or inguinal regions LUNGS: clear to auscultation b/l with normal respiratory effort HEART: regular rate & rhythm ABDOMEN:  normoactive bowel sounds , non tender, not distended. No palpable hepatosplenomegaly.  Extremity: no pedal edema PSYCH: alert & oriented x 3 with fluent speech NEURO: no focal motor/sensory deficits    LABORATORY DATA:  I have reviewed the data as listed. CBC Latest Ref Rng & Units 03/03/2019 12/30/2018 11/03/2018  WBC 4.0 - 10.5 K/uL 2.6(L) 5.5 3.9(L)  Hemoglobin 13.0 - 17.0 g/dL 9.7(L) 11.5(L) 13.0  Hematocrit 39.0 - 52.0 % 30.3(L) 34.8(L) 38.4(L)  Platelets 150 - 400 K/uL 102(L) 145(L) 183  ANC 2.0K  CMP Latest Ref Rng & Units 03/03/2019 12/30/2018 11/03/2018  Glucose 70 - 99 mg/dL 112(H) 98 188(H)  BUN 8 - 23 mg/dL 24(H) 20 37(H)  Creatinine 0.61 - 1.24 mg/dL 1.25(H) 1.05 1.25(H)  Sodium 135 - 145 mmol/L 140 143 139  Potassium 3.5 - 5.1 mmol/L 4.0 3.8  4.2  Chloride 98 - 111 mmol/L 103 104 102  CO2 22 -  32 mmol/L 27 30 26   Calcium 8.9 - 10.3 mg/dL 9.0 9.7 10.1  Total Protein 6.5 - 8.1 g/dL 6.2(L) 6.9 7.2  Total Bilirubin 0.3 - 1.2 mg/dL 0.9 0.6 0.5  Alkaline Phos 38 - 126 U/L 86 75 73  AST 15 - 41 U/L 20 19 20   ALT 0 - 44 U/L 8 10 11     Lab Results  Component Value Date   IRON 45 06/02/2017   TIBC 233 06/02/2017   IRONPCTSAT 19 (L) 06/02/2017   (Iron and TIBC)  Lab Results  Component Value Date   FERRITIN 308 08/26/2017         RADIOGRAPHIC STUDIES: I have personally reviewed the radiological images as listed and agreed with the findings in the report.       Bone Marrow Biopsy 12/24/2016 (Accession [id number])  Diagnosis Bone Marrow, Aspirate,Biopsy, and Clot, left iliac crest - HYPERCELLULAR BONE MARROW FOR AGE WITH PLASMA CELL NEOPLASM. - SEE COMMENT. PERIPHERAL BLOOD: - NORMOCYTIC-NORMOCHROMIC ANEMIA. - LEUKOPENIA.  DG Bone Survey Met (Accession [id number]) (Order [id number])  Imaging  Date: 11/28/2016 Department: [facility] Released By: Pricilla Riffle Authorizing: Brunetta Genera, MD  Exam Information   Status Exam Begun  Exam Ended   Final [99] 11/28/2016 11:59 AM 11/28/2016 12:36 PM  PACS Images   Show images for DG Bone Survey Met  Study Result   CLINICAL DATA:  Monoclonal gammopathy of unknown significance. History of squamous cell carcinoma excision, basal cell carcinoma excision.  EXAM: METASTATIC BONE SURVEY  COMPARISON:  CT neck, chest, abdomen and pelvis from 10/29/16, MRI of the head from 06/01/2016, CXR 10/27/2016, lumbar spine radiographs 06/24/2016  FINDINGS: Lateral skull: Small occipital lucency which may represent a normal arachnoid granulation posteriorly in the occiput. No definite lytic abnormality.  Cervical spine AP and lateral: Disc space narrowing at C2-3, and from C4 through C7. C4-5, C5-6 and C6-7 uncovertebral joint spurring bilaterally. No lytic abnormality.   Thoracic spine AP and lateral: T8-9 right-sided osteophytes and to a lesser degree T9-10. No lytic abnormality. Slight multilevel lumbar thoracic disc space narrowing likely degenerative.  Lumbar spine AP and lateral: Disc space narrowing at L4-5 and to a greater extent L5-S1. No lytic disease. L3 through S1 facet sclerosis.  AP pelvis: Negative for lytic disease.  Bilateral upper and lower extremities shoulders through wrist and from the hips through ankle: Negative for lytic disease. Joint space narrowing of the femorotibial compartments both knees. Subchondral cyst of the right patella.  CXR: Clear lungs. Cardiac implantable monitoring device projects over the left heart. Aortic atherosclerosis. No lytic disease.  IMPRESSION: No findings suspicious for lytic disease.   Electronically Signed   By: Ashley Royalty M.D.   On: 11/28/2016 14:58     ASSESSMENT & PLAN:   78 y.o. 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 (12/2016) Peak M spike 2.6  Cytogenetics and Myeloma FISH panel.- trisomy 11 Patient has normocytic anemia without any other clear etiology. (>2g/dl lower that lower limit of normal which is 13) which per criteria would place this in the Multiple myeloma category as opposed to Smoldering Multiple myeloma (Borderline criterion)  No overt renal failure or hypercalcemia at this time No focal bone pains though he has significant chronic back pain related to degenerative disc disease. Bone survey shows  no overt lytic lesions.  10/16/18 CT Coronary Morph revealed Coronary artery calcium score 299 Agatston units, this places the patient in the 47th percentile for age and gender, suggesting intermediate risk for future cardiac events. 2. Nonobstructive coronary disease.  2) Anemia and thrombocytopenia -- related to treatment (Revlimid)   3) Significant Diarrhea and intermittent nausea-  Resolved 09/04/18 Enteropathogenic E.coli detected -Ordered Levofloxacin   4) Grade 2 Neuropathy -- in b/l feet and halfway up lower leg without pain -Seconadry to [employer] -Will monitor closely and will reduce medication if needed  5) Right kidney cyst with nodularity -Reviewed the 01/05/19 US Abdomen which revealed Septated cyst arising from lower pole right kidney with a questionable solid focus within this lesion. This nodular area potentially could represent a small neoplasm developing within this complex cyst. Given this circumstance, pre and post contrast MR of the kidneys is advised to further evaluate. If there is a contraindication to MR, pre and post-contrast CT would be a reasonable alternative to further assess. Study otherwise unremarkable. -Pt will likely need urology referral and MRI, pt would like to discuss this further with his PCP before triggering a referral at this point  PLAN: -Discussed pt labwork from 03/03/19; WBC lower at 2.6k, HGB lower at 9.7, PLT lower at 102k. M Protein at 0.2g. -Pt is currently in off week of Revlimid, suspect cytopenias will continue to bounce back during this week, also suspect cytopenic effect from Allopurinol -Would recommend decreasing dose of allopurinol with PCP, if gout is otherwise stable -The pt has no prohibitive toxicities from continuing maintenance 62m Revlimid, 3 weeks on and 1 week off, at this time. -Reviewed the 12/08/18 Bone Density study which revealed that the pt has osteoporosis -Begin weekly 50k units of Vitamin D replacement -Recommend beginning Prolia injections every 6 months to treat osteoporosis aggressively. Denies dental concerns -Continue follow up with Dr. KLyman Bishopin cardiology -Continue Vitamin B12 replacement  -Continue follow up with dermatology for regular skin checks -Recommended that the pt keep his legs well moisturized -Continue Vitamin B complex -Patient will discuss with PCP about urology referral  for renal lesion -Recommended that the pt continue to eat well, drink at least 48-64 oz of water each day, and walk 20-30 minutes each day. -Recommend crowd avoidance and infection prevention strategies -Will see the pt back in 2 months      6) Borderline low B12 levels, previously 299 --- on replacement -  -12/30/18 Vitamin B12 at 893 -continue replacement.   7) recurrent CVA - thought to be related to small vessel disease as per neurology. Has a small PFO which could be an additional risk factor as well as his afib  8)  P afib Plan -Continue on Xarelto per cardiology.  9) H/o recurrent SCC -Underwent surgical excision on right forearm with Dr. CRomana Juniperon 07/28/18. His surgical pathology showed evidence of Squamous Cell Carcinoma. Plan -no evidence of recurrence locally at the site of surgery at this time.  10) Lower extremity discomfort - improved, stable. Likely Discogenic pain -[place] revealed no blood clot -Pt has established care with orthopedist and PT.  11) Patient Active Problem List   Diagnosis Date Noted  . Chest pain 09/27/2018  . Multiple myeloma ([place] 09/27/2018  . History of loop recorder 06/12/2017  . Cryptogenic stroke ([place] 10/28/2016  . Gait abnormality   . History of CVA (cerebrovascular accident)   . History of TIA (transient ischemic attack)   . Benign essential HTN   .  Paroxysmal SVT (supraventricular tachycardia) ([place])   . Acute blood loss anemia   . Acute ischemic stroke ([place])   . CVA (cerebral vascular accident) ([place]) 10/26/2016  . History of recent stroke 06/06/2016  . 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 TIA (transient ischemic attack) 06/02/2016  . Numbness 06/01/2016  . Right arm numbness 06/01/2016  . Atherosclerosis of native coronary artery of native heart without angina pectoris 03/20/2016  . Hypertension   . GERD (gastroesophageal reflux disease)   . Gout   . S/P RF ablation operation for arrhythmia 12/13/2014  .  Hyperlipidemia 12/13/2014  . Controlled type 2 diabetes mellitus with complication, without long-term current use of insulin ([place]) 12/07/2014   PLAN -Continue f/u with PCP (blood sugars)   RTC with Dr Irene Limbo with labs in 2 months. Plz schedule labs 1 week prior to clinic visit   All of the patients questions were answered with apparent satisfaction. The patient knows to call the clinic with any problems, questions or concerns.  The total time spent in the appt was 25 minutes and more than 50% was on counseling and direct patient cares.    Sullivan Lone MD [place] AAHIVMS [facility] Hematology/Oncology Physician [facility]  (Office):       [phone] (Work cell):  [phone] (Fax):           [phone]  I, Baldwin [place], am acting as a scribe for Dr. Sullivan Lone.   .I have reviewed the above documentation for accuracy and completeness, and I agree with the above. Brunetta Genera MD

## 2019-03-10 ENCOUNTER — Other Ambulatory Visit: Payer: Self-pay

## 2019-03-10 ENCOUNTER — Inpatient Hospital Stay (HOSPITAL_BASED_OUTPATIENT_CLINIC_OR_DEPARTMENT_OTHER): Payer: Medicare Other | Admitting: Hematology

## 2019-03-10 ENCOUNTER — Telehealth: Payer: Self-pay | Admitting: Hematology

## 2019-03-10 VITALS — BP 110/60 | HR 60 | Temp 98.4°F | Resp 17 | Ht 68.0 in | Wt 178.4 lb

## 2019-03-10 DIAGNOSIS — G8929 Other chronic pain: Secondary | ICD-10-CM

## 2019-03-10 DIAGNOSIS — Z8673 Personal history of transient ischemic attack (TIA), and cerebral infarction without residual deficits: Secondary | ICD-10-CM

## 2019-03-10 DIAGNOSIS — M810 Age-related osteoporosis without current pathological fracture: Secondary | ICD-10-CM

## 2019-03-10 DIAGNOSIS — N281 Cyst of kidney, acquired: Secondary | ICD-10-CM

## 2019-03-10 DIAGNOSIS — M549 Dorsalgia, unspecified: Secondary | ICD-10-CM | POA: Diagnosis not present

## 2019-03-10 DIAGNOSIS — D649 Anemia, unspecified: Secondary | ICD-10-CM

## 2019-03-10 DIAGNOSIS — C9000 Multiple myeloma not having achieved remission: Secondary | ICD-10-CM

## 2019-03-10 DIAGNOSIS — E538 Deficiency of other specified B group vitamins: Secondary | ICD-10-CM

## 2019-03-10 DIAGNOSIS — Z85828 Personal history of other malignant neoplasm of skin: Secondary | ICD-10-CM | POA: Diagnosis not present

## 2019-03-10 DIAGNOSIS — Z7901 Long term (current) use of anticoagulants: Secondary | ICD-10-CM

## 2019-03-10 DIAGNOSIS — G620 Drug-induced polyneuropathy: Secondary | ICD-10-CM

## 2019-03-10 DIAGNOSIS — D6481 Anemia due to antineoplastic chemotherapy: Secondary | ICD-10-CM | POA: Diagnosis not present

## 2019-03-10 DIAGNOSIS — M818 Other osteoporosis without current pathological fracture: Secondary | ICD-10-CM

## 2019-03-10 DIAGNOSIS — D6959 Other secondary thrombocytopenia: Secondary | ICD-10-CM | POA: Diagnosis not present

## 2019-03-10 HISTORY — DX: Multiple myeloma not having achieved remission: C90.00

## 2019-03-10 HISTORY — DX: Age-related osteoporosis without current pathological fracture: M81.0

## 2019-03-10 NOTE — Telephone Encounter (Signed)
Gave avs and calendar ° °

## 2019-03-17 ENCOUNTER — Other Ambulatory Visit: Payer: Self-pay

## 2019-03-17 ENCOUNTER — Inpatient Hospital Stay: Payer: Medicare Other

## 2019-03-17 VITALS — BP 124/55 | HR 69 | Temp 98.5°F | Resp 18

## 2019-03-17 DIAGNOSIS — M818 Other osteoporosis without current pathological fracture: Secondary | ICD-10-CM

## 2019-03-17 DIAGNOSIS — C9000 Multiple myeloma not having achieved remission: Secondary | ICD-10-CM | POA: Diagnosis not present

## 2019-03-17 DIAGNOSIS — D6481 Anemia due to antineoplastic chemotherapy: Secondary | ICD-10-CM | POA: Diagnosis not present

## 2019-03-17 DIAGNOSIS — E538 Deficiency of other specified B group vitamins: Secondary | ICD-10-CM | POA: Diagnosis not present

## 2019-03-17 DIAGNOSIS — G620 Drug-induced polyneuropathy: Secondary | ICD-10-CM | POA: Diagnosis not present

## 2019-03-17 DIAGNOSIS — D6959 Other secondary thrombocytopenia: Secondary | ICD-10-CM | POA: Diagnosis not present

## 2019-03-17 DIAGNOSIS — Z85828 Personal history of other malignant neoplasm of skin: Secondary | ICD-10-CM | POA: Diagnosis not present

## 2019-03-17 MED ORDER — DENOSUMAB 60 MG/ML ~~LOC~~ SOSY
60.0000 mg | PREFILLED_SYRINGE | Freq: Once | SUBCUTANEOUS | Status: AC
  Administered 2019-03-17: 60 mg via SUBCUTANEOUS

## 2019-03-17 MED ORDER — DENOSUMAB 60 MG/ML ~~LOC~~ SOSY
PREFILLED_SYRINGE | SUBCUTANEOUS | Status: AC
  Filled 2019-03-17: qty 1

## 2019-03-17 NOTE — Patient Instructions (Signed)

## 2019-03-18 ENCOUNTER — Telehealth: Payer: Self-pay | Admitting: *Deleted

## 2019-03-18 NOTE — Telephone Encounter (Signed)
Contacted by Coralyn Pear from [facility] Dentist. Patient has broken tooth and due to coronavirus restrictions, dentist cannot see him right now. They want to prescribe Ibuprofen 600 mg q4-6h and Amoxicillin 500 mg q8h for 7 days. Is this ok with Dr. Irene Limbo? Call back [phone]. Per Dr. Irene Limbo: Antibiotic is ok. Would not recommend ibuprofen since patient is on therapeutic anticoagulants and his myeloma would increase risk of renal injury.   Called back at number given and LVM with information given by Dr. Irene Limbo. Asked to call office if any questions.

## 2019-03-19 ENCOUNTER — Other Ambulatory Visit: Payer: Self-pay | Admitting: *Deleted

## 2019-03-19 DIAGNOSIS — C9000 Multiple myeloma not having achieved remission: Secondary | ICD-10-CM

## 2019-03-19 MED ORDER — LENALIDOMIDE 10 MG PO CAPS: 10.0000 mg | ORAL_CAPSULE | Freq: Every day | ORAL | 0 refills | Status: DC

## 2019-03-19 NOTE — Telephone Encounter (Signed)
Per OV note 3/18 refilled Revlimid. Refill sent to [facility] [place]

## 2019-03-20 ENCOUNTER — Telehealth: Payer: Self-pay

## 2019-03-20 NOTE — Telephone Encounter (Signed)
Spoke with pt regarding virtual visit on 03/22/19. Advise pt to check pulse and BP day of appt. Pt stared he is not able to upload an EKG. Pt questions concerns were address.

## 2019-03-22 ENCOUNTER — Other Ambulatory Visit: Payer: Self-pay

## 2019-03-22 ENCOUNTER — Encounter: Payer: Self-pay | Admitting: Internal Medicine

## 2019-03-22 ENCOUNTER — Telehealth (INDEPENDENT_AMBULATORY_CARE_PROVIDER_SITE_OTHER): Payer: Medicare Other | Admitting: Internal Medicine

## 2019-03-22 ENCOUNTER — Telehealth: Payer: Self-pay | Admitting: Internal Medicine

## 2019-03-22 ENCOUNTER — Telehealth: Payer: Medicare Other | Admitting: Internal Medicine

## 2019-03-22 DIAGNOSIS — I639 Cerebral infarction, unspecified: Secondary | ICD-10-CM

## 2019-03-22 DIAGNOSIS — I2510 Atherosclerotic heart disease of native coronary artery without angina pectoris: Secondary | ICD-10-CM

## 2019-03-22 DIAGNOSIS — I480 Paroxysmal atrial fibrillation: Secondary | ICD-10-CM

## 2019-03-22 DIAGNOSIS — G459 Transient cerebral ischemic attack, unspecified: Secondary | ICD-10-CM

## 2019-03-22 DIAGNOSIS — Z8673 Personal history of transient ischemic attack (TIA), and cerebral infarction without residual deficits: Secondary | ICD-10-CM

## 2019-03-22 NOTE — Progress Notes (Signed)
Electrophysiology TeleHealth Note   Due to national recommendations of social distancing due to COVID 19, an audio/video telehealth visit is felt to be most appropriate for this patient at this time.  See MyChart message from today for the patient's consent to telehealth for [facility].   Date:  03/22/2019   ID:  Justin [name], DOB 09-05-1941, MRN [id number]  Location: patient's home  Provider location: [address] [place]  Evaluation Performed: Follow-up visit  PCP:  Mayra Neer, MD  Cardiologist:  Pixie Casino, MD  Electrophysiologist:  Dr Rayann Heman  Chief Complaint:  afib  History of Present Illness:    Justin [name] is a 78 y.o. male who presents via audio/video conferencing for a telehealth visit today.  Since last being seen in our clinic, the patient reports doing very well.  Today, he denies symptoms of palpitations, chest pain, shortness of breath,  lower extremity edema, dizziness, presyncope, or syncope.  The patient is otherwise without complaint today.  The patient denies symptoms of fevers, chills, cough, or new SOB worrisome for COVID 19.  Past Medical History:  Diagnosis Date  . Anemia   . Arthritis   . 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 Diabetes mellitus without complication ([place])   . GERD (gastroesophageal reflux disease)   . Gout   . History of loop recorder   . Hypertension   . Left leg weakness   . Multiple myeloma ([place]) 09/27/2018  . Paroxysmal SVT (supraventricular tachycardia) ([place])    a. s/p RFCA on 05/01/15  . Stroke [facility]) 2017   TIA and mild stroke; denies stroke symptoms since then     Past Surgical History:  Procedure Laterality Date  . ATRIAL FIBRILLATION ABLATION     Dr. Lovena Le  . BASAL CELL CARCINOMA EXCISION     off of back  . ELECTROPHYSIOLOGIC STUDY N/A 05/01/2015   Procedure: SVT Ablation;  Surgeon: Evans Lance, MD;  Location: [place] [facility];  Service:  Cardiovascular;  Laterality: N/A;  . EP IMPLANTABLE DEVICE N/A 06/04/2016   Procedure: Loop Recorder Insertion;  Surgeon: Thompson Grayer, MD;  Location: [place] CV LAB;  Service: Cardiovascular;  Laterality: N/A;  . INGUINAL HERNIA REPAIR Bilateral 06/16/2017   Procedure: LAPAROSCOPIC BILATERAL INGUINAL HERNIA REPAIR;  Surgeon: Clovis Riley, MD;  Location: [place];  Service: General;  Laterality: Bilateral;  . INSERTION OF MESH Bilateral 06/16/2017   Procedure: INSERTION OF MESH;  Surgeon: Clovis Riley, MD;  Location: [place];  Service: General;  Laterality: Bilateral;  . MASS EXCISION Right 07/28/2018   Procedure: EXCISION OF RIGHT FOREARM MASS;  Surgeon: Clovis Riley, MD;  [facility];  Service: General;  Laterality: Right;  . SQUAMOUS CELL CARCINOMA EXCISION    . TEE WITHOUT CARDIOVERSION N/A 06/04/2016   Procedure: TRANSESOPHAGEAL ECHOCARDIOGRAM (TEE);  Surgeon: Pixie Casino, MD;  Location: [facility] ENDOSCOPY;  Service: Cardiovascular;  Laterality: N/A;    Current Outpatient Medications  Medication Sig Dispense Refill  . allopurinol (ZYLOPRIM) 300 MG tablet Take 300 mg by mouth daily.     [name] Kitchen atorvastatin (LIPITOR) 40 MG tablet TAKE 1 TABLET EVERY MORNING (Patient taking differently: Take 40 mg by mouth daily. ) 90 tablet 4  . cyanocobalamin 1000 MCG tablet Take 1,000 mcg by mouth daily.    . fluorouracil (EFUDEX) 5 % cream Apply 1 application topically daily as needed (for skin cancer prevention).  0  . gabapentin (NEURONTIN) 300 MG capsule Take 300 mg by mouth 3 (three) times daily.     [name] Kitchen glucose monitoring kit (FREESTYLE) monitoring kit 1 each by Does not apply route as needed for other.    . lenalidomide (REVLIMID) 10 MG capsule Take 1 capsule (10 mg total) by mouth daily. Take for 21 days on, 7 days off, repeat every 28 days 21 capsule 0  . metFORMIN (GLUCOPHAGE) 1000 MG tablet Take 1,000 mg by mouth daily with breakfast.    . nitroGLYCERIN (NITROSTAT) 0.4 MG SL tablet Place  1 tablet (0.4 mg total) under the tongue every 5 (five) minutes as needed for chest pain (Max 3 doses in 15 mins.). 30 tablet 0  . omeprazole (PRILOSEC) 20 MG capsule Take 20 mg by mouth every morning.     . rivaroxaban (XARELTO) 20 MG TABS tablet Take 1 tablet (20 mg total) by mouth daily with supper. 90 tablet 2  . telmisartan-hydrochlorothiazide (MICARDIS HCT) 80-12.5 MG tablet Take 1 tablet by mouth daily. 90 tablet 3  . traMADol (ULTRAM) 50 MG tablet Take 1 tablet by mouth as directed.     No current facility-administered medications for this visit.     Allergies:   No known allergies   Social History:  The patient  reports that he has never smoked. He has never used smokeless tobacco. He reports that he does not drink alcohol or use drugs.   Family History:  The patient's  family history includes Alzheimer's disease in his father; Down syndrome in his daughter; Heart failure in his father; Hypertension in his mother; Stroke in his mother.   ROS:  Please see the history of present illness.   All other systems are personally reviewed and negative.    Exam:    Vital Signs:  BP 101/63   Pulse 68   Wt 174 lb (78.9 kg)   BMI 26.46 kg/m   Well appearing, alert and conversant, regular work of breathing,  good skin color Eyes- anicteric, neuro- grossly intact, skin- no apparent rash or lesions or cyanosis, mouth- oral mucosa is pink   I looked a macular rash along the bicipital region.  He thinks this is due to poison ivy and is going to take a steroid cream for this OTC.   Labs/Other Tests and Data Reviewed:    Recent Labs: 03/03/2019: ALT 8; BUN 24; Creatinine 1.25; Hemoglobin 9.7; Platelets 102; Potassium 4.0; Sodium 140   Wt Readings from Last 3 Encounters:  03/22/19 174 lb (78.9 kg)  03/10/19 178 lb 6.4 oz (80.9 kg)  01/06/19 176 lb 6.4 oz (80 kg)     Other studies personally reviewed: Additional studies/ records that were reviewed today include:   Review of the above  records today demonstrates: as above Prior radiographs: abdominal MRI 02/02/2019 reviewed and reveals multilobulated cystic lesions along the R kidney and pancreas  Last device remote is reviewed from [place] PDF dated 02/26/2019 which reveals no recent afib   ASSESSMENT & PLAN:    1.  Paroxysmal atrial fibrillation Very well afib burden by ILR (reviewed at length today) Continue on long term anticoagulation given prior stroke.  2. Mild CAD Followed by Dr Debara Pickett No ischemic symptoms  3. COVID 19 screen The patient denies symptoms of COVID 19 at this time.  The importance of social distancing was discussed today.  Follow-up:  12 months with EP PA Next remote: monthly carelink  Current medicines are reviewed at length with the patient  today.   The patient does not have concerns regarding his medicines.  The following changes were made today:  none  Labs/ tests ordered today include:  No orders of the defined types were placed in this encounter.   Patient Risk:  after full review of this patients clinical status, I feel that they are at moderate risk at this time.  Today, I have spent 24 minutes with the patient with telehealth technology discussing stroke, afib management, and importance of compliance with remote monitoring and anticoagulation .    Army Fossa, MD  03/22/2019 12:58 PM     [place] [address] [phone] (office) [phone] (fax)

## 2019-03-22 NOTE — Telephone Encounter (Signed)
Pt contacted by AS.  No further action needed.

## 2019-03-22 NOTE — Telephone Encounter (Signed)
New message:    Patient calling concerning hs appt that is for 11:15 please call patient back. He states he can not get on for appt

## 2019-03-22 NOTE — Progress Notes (Unsigned)
Electrophysiology TeleHealth Note   Due to national recommendations of social distancing due to COVID 19, an audio/video telehealth visit is felt to be most appropriate for this patient at this time.  See MyChart message from today for the patient's consent to telehealth for [facility].   Date:  03/22/2019   ID:  Justin [name], DOB 11-28-1941, MRN [id number]  Location: patient's home  Provider location: [address], [place]  Evaluation Performed: Follow-up visit  PCP:  Mayra Neer, MD  Cardiologist:  Pixie Casino, MD *** Electrophysiologist:  Dr Rayann Heman  Chief Complaint:  ***  History of Present Illness:    Justin [name] is a 78 y.o. male who presents via audio/video conferencing for a telehealth visit today.  Since last being seen in our clinic, the patient reports doing very well.  Today, he denies symptoms of palpitations, chest pain, shortness of breath,  lower extremity edema, dizziness, presyncope, or syncope.  The patient is otherwise without complaint today.  The patient denies symptoms of fevers, chills, cough, or new SOB worrisome for COVID 19.  Past Medical History:  Diagnosis Date  . Anemia   . Arthritis   . 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 Diabetes mellitus without complication ([place])   . GERD (gastroesophageal reflux disease)   . Gout   . History of loop recorder   . Hypertension   . Left leg weakness   . Multiple myeloma ([place]) 09/27/2018  . Paroxysmal SVT (supraventricular tachycardia) ([place])    a. s/p RFCA on 05/01/15  . Stroke [facility]) 2017   TIA and mild stroke; denies stroke symptoms since then     Past Surgical History:  Procedure Laterality Date  . ATRIAL FIBRILLATION ABLATION     Dr. Lovena Le  . BASAL CELL CARCINOMA EXCISION     off of back  . ELECTROPHYSIOLOGIC STUDY N/A 05/01/2015   Procedure: SVT Ablation;  Surgeon: Evans Lance, MD;  Location: [place] CV [facility];  Service:  Cardiovascular;  Laterality: N/A;  . EP IMPLANTABLE DEVICE N/A 06/04/2016   Procedure: Loop Recorder Insertion;  Surgeon: Thompson Grayer, MD;  Location: [place] [facility];  Service: Cardiovascular;  Laterality: N/A;  . INGUINAL HERNIA REPAIR Bilateral 06/16/2017   Procedure: LAPAROSCOPIC BILATERAL INGUINAL HERNIA REPAIR;  Surgeon: Clovis Riley, MD;  Location: [place];  Service: General;  Laterality: Bilateral;  . INSERTION OF MESH Bilateral 06/16/2017   Procedure: INSERTION OF MESH;  Surgeon: Clovis Riley, MD;  Location: [place];  Service: General;  Laterality: Bilateral;  . MASS EXCISION Right 07/28/2018   Procedure: EXCISION OF RIGHT FOREARM MASS;  Surgeon: Clovis Riley, MD;  [facility];  Service: General;  Laterality: Right;  . SQUAMOUS CELL CARCINOMA EXCISION    . TEE WITHOUT CARDIOVERSION N/A 06/04/2016   Procedure: TRANSESOPHAGEAL ECHOCARDIOGRAM (TEE);  Surgeon: Pixie Casino, MD;  Location: [facility] ENDOSCOPY;  Service: Cardiovascular;  Laterality: N/A;    Current Outpatient Medications  Medication Sig Dispense Refill  . allopurinol (ZYLOPRIM) 300 MG tablet Take 300 mg by mouth daily.     [name] Kitchen atorvastatin (LIPITOR) 40 MG tablet TAKE 1 TABLET EVERY MORNING (Patient taking differently: Take 40 mg by mouth daily. ) 90 tablet 4  . cyanocobalamin 1000 MCG tablet Take 1,000 mcg by mouth daily.    . fluorouracil (EFUDEX) 5 % cream Apply 1 application topically daily as needed (for skin cancer prevention).  0  . gabapentin (NEURONTIN) 300 MG capsule Take 300 mg by mouth 3 (three) times daily.     [name] Kitchen glucose monitoring kit (FREESTYLE) monitoring kit 1 each by Does not apply route as needed for other.    . lenalidomide (REVLIMID) 10 MG capsule Take 1 capsule (10 mg total) by mouth daily. Take for 21 days on, 7 days off, repeat every 28 days 21 capsule 0  . metFORMIN (GLUCOPHAGE) 1000 MG tablet Take 1,000 mg by mouth daily with breakfast.    . nitroGLYCERIN (NITROSTAT) 0.4 MG SL tablet Place  1 tablet (0.4 mg total) under the tongue every 5 (five) minutes as needed for chest pain (Max 3 doses in 15 mins.). 30 tablet 0  . omeprazole (PRILOSEC) 20 MG capsule Take 20 mg by mouth every morning.     . rivaroxaban (XARELTO) 20 MG TABS tablet Take 1 tablet (20 mg total) by mouth daily with supper. 90 tablet 2  . telmisartan-hydrochlorothiazide (MICARDIS HCT) 80-12.5 MG tablet Take 1 tablet by mouth daily. 90 tablet 3  . traMADol (ULTRAM) 50 MG tablet Take 1 tablet by mouth as directed.     No current facility-administered medications for this visit.     Allergies:   No known allergies   Social History:  The patient  reports that he has never smoked. He has never used smokeless tobacco. He reports that he does not drink alcohol or use drugs.   Family History:  The patient's *** family history includes Alzheimer's disease in his father; Down syndrome in his daughter; Heart failure in his father; Hypertension in his mother; Stroke in his mother.   ROS:  Please see the history of present illness.   All other systems are personally reviewed and negative.    Exam:    Vital Signs:  There were no vitals taken for this visit.  Well appearing, alert and conversant, regular work of breathing,  good skin color Eyes- anicteric, neuro- grossly intact, skin- no apparent rash or lesions or cyanosis, mouth- oral mucosa is pink   Labs/Other Tests and Data Reviewed:    Recent Labs: 03/03/2019: ALT 8; BUN 24; Creatinine 1.25; Hemoglobin 9.7; Platelets 102; Potassium 4.0; Sodium 140   Wt Readings from Last 3 Encounters:  03/10/19 178 lb 6.4 oz (80.9 kg)  01/06/19 176 lb 6.4 oz (80 kg)  11/11/18 168 lb 11.2 oz (76.5 kg)     Other studies personally reviewed: Additional studies/ records that were reviewed today include: ***  Review of the above records today demonstrates: as above Prior radiographs: ***  The patient presents wearable device technology report for my review today. On my review,  the patient presents with Apple Watch tracings from *** . The tracings reveal ***  Last device remote is reviewed from [place] PDF dated *** which reveals normal device function, no arrhythmias ***   ASSESSMENT & PLAN:    1.  ***   COVID 19 screen The patient denies symptoms of COVID 19 at this time.  The importance of social distancing was discussed today.  Follow-up:  *** Next remote: ***  Current medicines are reviewed at length with the patient today.   The patient {ACTIONS; HAS/DOES NOT HAVE:19233} concerns regarding his medicines.  The following changes were made today:  {NONE DEFAULTED:18576::"none"}  Labs/ tests ordered today include: *** No orders of the defined types were placed in this encounter.    Patient Risk:  after full review of this patients clinical status, I feel that  they are at moderate risk at this time.  Today, I have spent *** minutes with the patient with telehealth technology discussing *** .    Army Fossa, MD  03/22/2019 11:43 AM     [place] [address] [phone] (office) [phone] (fax)

## 2019-03-23 ENCOUNTER — Other Ambulatory Visit: Payer: Self-pay | Admitting: Physician Assistant

## 2019-03-23 ENCOUNTER — Other Ambulatory Visit: Payer: Self-pay | Admitting: Internal Medicine

## 2019-03-23 NOTE — Telephone Encounter (Signed)
Pt needs refill for XARELTO please address thank you.

## 2019-03-23 NOTE — Telephone Encounter (Signed)
Pt had video visit with Dr Rayann Heman on 03/22/19, last labs 03/03/19 Creat worsening 1.25, age 78, weight 78.9kg, CrCl 53.48, based on CrCl pt is on appropriate dosage of Xarelto 20mg  QD.  Will refill rx x 6 months then reassess CrCl.

## 2019-03-23 NOTE — Telephone Encounter (Signed)
Xarelto 20mg  refill request received; pt is 78 yrs old, wt-80.9kg, Crea-1.25 on 3/12020, last seen by Dr. Rayann Heman via audio/video telehealth visit on 03/22/2019, CrCL-54.33ml/min; refill sent.

## 2019-03-23 NOTE — Telephone Encounter (Signed)
Pt requesting refill for xarelto please address thank you.

## 2019-03-24 ENCOUNTER — Telehealth: Payer: Medicare Other | Admitting: Internal Medicine

## 2019-03-31 ENCOUNTER — Other Ambulatory Visit: Payer: Self-pay

## 2019-03-31 ENCOUNTER — Ambulatory Visit (INDEPENDENT_AMBULATORY_CARE_PROVIDER_SITE_OTHER): Payer: Medicare Other | Admitting: *Deleted

## 2019-03-31 DIAGNOSIS — I639 Cerebral infarction, unspecified: Secondary | ICD-10-CM

## 2019-03-31 DIAGNOSIS — I480 Paroxysmal atrial fibrillation: Secondary | ICD-10-CM

## 2019-04-01 LAB — CUP PACEART REMOTE DEVICE CHECK
Date Time Interrogation Session: 20200408213805
Implantable Pulse Generator Implant Date: 20170613

## 2019-04-09 NOTE — Progress Notes (Signed)
Carelink Summary Report / Loop Recorder 

## 2019-04-12 DIAGNOSIS — C9000 Multiple myeloma not having achieved remission: Secondary | ICD-10-CM | POA: Diagnosis not present

## 2019-04-12 DIAGNOSIS — M109 Gout, unspecified: Secondary | ICD-10-CM | POA: Diagnosis not present

## 2019-04-12 DIAGNOSIS — E1169 Type 2 diabetes mellitus with other specified complication: Secondary | ICD-10-CM | POA: Diagnosis not present

## 2019-04-12 DIAGNOSIS — K219 Gastro-esophageal reflux disease without esophagitis: Secondary | ICD-10-CM | POA: Diagnosis not present

## 2019-04-12 DIAGNOSIS — Z7984 Long term (current) use of oral hypoglycemic drugs: Secondary | ICD-10-CM | POA: Diagnosis not present

## 2019-04-12 DIAGNOSIS — I10 Essential (primary) hypertension: Secondary | ICD-10-CM | POA: Diagnosis not present

## 2019-04-12 DIAGNOSIS — M810 Age-related osteoporosis without current pathological fracture: Secondary | ICD-10-CM | POA: Diagnosis not present

## 2019-04-12 DIAGNOSIS — I4891 Unspecified atrial fibrillation: Secondary | ICD-10-CM | POA: Diagnosis not present

## 2019-04-12 DIAGNOSIS — Z8673 Personal history of transient ischemic attack (TIA), and cerebral infarction without residual deficits: Secondary | ICD-10-CM | POA: Diagnosis not present

## 2019-04-12 DIAGNOSIS — G629 Polyneuropathy, unspecified: Secondary | ICD-10-CM | POA: Diagnosis not present

## 2019-04-12 DIAGNOSIS — S025XXA Fracture of tooth (traumatic), initial encounter for closed fracture: Secondary | ICD-10-CM | POA: Diagnosis not present

## 2019-04-14 ENCOUNTER — Inpatient Hospital Stay: Payer: Medicare Other

## 2019-04-14 ENCOUNTER — Other Ambulatory Visit: Payer: Self-pay | Admitting: *Deleted

## 2019-04-14 ENCOUNTER — Other Ambulatory Visit: Payer: Self-pay

## 2019-04-14 ENCOUNTER — Telehealth: Payer: Self-pay | Admitting: Hematology

## 2019-04-14 DIAGNOSIS — C9000 Multiple myeloma not having achieved remission: Secondary | ICD-10-CM

## 2019-04-14 MED ORDER — LENALIDOMIDE 10 MG PO CAPS: 10.0000 mg | ORAL_CAPSULE | Freq: Every day | ORAL | 0 refills | Status: DC

## 2019-04-14 NOTE — Telephone Encounter (Signed)
Cancelled monthly injections through August. Per 4/22 schedule message injections should ne every 6 months in line with 03/10/19 los. Spoke with patient re change and confirmed next appointments on schedule for lab 5/13, Drs. Irene Limbo 5/20 and next injection 9/23.  Message sent to [facility]/desk nurse re request per 3/18 los to schedule 6 doses of prolia injections. Due to this request would be equivalent to three years patient was scheduled for next injection 9/23. Patient will be schedule for additional injections as he sees [facility] for follow up.

## 2019-04-14 NOTE — Telephone Encounter (Signed)
Revlimid refilled per office visit note 03/10/2019. Refill sent to [facility], [place]. [employer] auth# [id number], 04/14/2019

## 2019-05-03 ENCOUNTER — Ambulatory Visit (INDEPENDENT_AMBULATORY_CARE_PROVIDER_SITE_OTHER): Payer: Medicare Other | Admitting: *Deleted

## 2019-05-03 ENCOUNTER — Other Ambulatory Visit: Payer: Self-pay

## 2019-05-03 DIAGNOSIS — I639 Cerebral infarction, unspecified: Secondary | ICD-10-CM | POA: Diagnosis not present

## 2019-05-03 DIAGNOSIS — I480 Paroxysmal atrial fibrillation: Secondary | ICD-10-CM

## 2019-05-04 ENCOUNTER — Telehealth: Payer: Self-pay | Admitting: *Deleted

## 2019-05-04 LAB — CUP PACEART REMOTE DEVICE CHECK
Date Time Interrogation Session: 20200511221105
Implantable Pulse Generator Implant Date: 20170613

## 2019-05-04 NOTE — Telephone Encounter (Signed)
Change appt on 5/20 to a Phone Appt per Dr. Irene Limbo

## 2019-05-05 ENCOUNTER — Inpatient Hospital Stay: Payer: Medicare Other | Attending: Hematology

## 2019-05-05 ENCOUNTER — Other Ambulatory Visit: Payer: Self-pay

## 2019-05-05 DIAGNOSIS — M810 Age-related osteoporosis without current pathological fracture: Secondary | ICD-10-CM

## 2019-05-05 DIAGNOSIS — C9000 Multiple myeloma not having achieved remission: Secondary | ICD-10-CM

## 2019-05-05 DIAGNOSIS — E538 Deficiency of other specified B group vitamins: Secondary | ICD-10-CM

## 2019-05-05 LAB — CBC WITH DIFFERENTIAL/PLATELET
Abs Immature Granulocytes: 0 10*3/uL (ref 0.00–0.07)
Basophils Absolute: 0.1 10*3/uL (ref 0.0–0.1)
Basophils Relative: 2 %
Eosinophils Absolute: 0.2 10*3/uL (ref 0.0–0.5)
Eosinophils Relative: 9 %
HCT: 33.8 % — ABNORMAL LOW (ref 39.0–52.0)
Hemoglobin: 10.9 g/dL — ABNORMAL LOW (ref 13.0–17.0)
Immature Granulocytes: 0 %
Lymphocytes Relative: 37 %
Lymphs Abs: 0.8 10*3/uL (ref 0.7–4.0)
MCH: 30.5 pg (ref 26.0–34.0)
MCHC: 32.2 g/dL (ref 30.0–36.0)
MCV: 94.7 fL (ref 80.0–100.0)
Monocytes Absolute: 0.3 10*3/uL (ref 0.1–1.0)
Monocytes Relative: 12 %
Neutro Abs: 0.9 10*3/uL — ABNORMAL LOW (ref 1.7–7.7)
Neutrophils Relative %: 40 %
Platelets: 126 10*3/uL — ABNORMAL LOW (ref 150–400)
RBC: 3.57 MIL/uL — ABNORMAL LOW (ref 4.22–5.81)
RDW: 15.4 % (ref 11.5–15.5)
WBC: 2.2 10*3/uL — ABNORMAL LOW (ref 4.0–10.5)
nRBC: 0 % (ref 0.0–0.2)

## 2019-05-05 LAB — CMP (CANCER CENTER ONLY)
ALT: 11 U/L (ref 0–44)
AST: 19 U/L (ref 15–41)
Albumin: 3.6 g/dL (ref 3.5–5.0)
Alkaline Phosphatase: 84 U/L (ref 38–126)
Anion gap: 8 (ref 5–15)
BUN: 27 mg/dL — ABNORMAL HIGH (ref 8–23)
CO2: 24 mmol/L (ref 22–32)
Calcium: 8 mg/dL — ABNORMAL LOW (ref 8.9–10.3)
Chloride: 106 mmol/L (ref 98–111)
Creatinine: 1.23 mg/dL (ref 0.61–1.24)
GFR, Est AFR Am: >60 mL/min (ref >60)
GFR, Estimated: 55 mL/min — ABNORMAL LOW (ref >60)
Glucose, Bld: 120 mg/dL — ABNORMAL HIGH (ref 70–99)
Potassium: 4 mmol/L (ref 3.5–5.1)
Sodium: 138 mmol/L (ref 135–145)
Total Bilirubin: 0.6 mg/dL (ref 0.3–1.2)
Total Protein: 6.3 g/dL — ABNORMAL LOW (ref 6.5–8.1)

## 2019-05-05 LAB — VITAMIN B12: Vitamin B-12: 586 pg/mL (ref 180–914)

## 2019-05-06 ENCOUNTER — Other Ambulatory Visit: Payer: Self-pay | Admitting: Hematology

## 2019-05-06 ENCOUNTER — Telehealth: Payer: Self-pay | Admitting: *Deleted

## 2019-05-06 LAB — MULTIPLE MYELOMA PANEL, SERUM
Albumin SerPl Elph-Mcnc: 3.5 g/dL (ref 2.9–4.4)
Albumin/Glob SerPl: 1.5 (ref 0.7–1.7)
Alpha 1: 0.2 g/dL (ref 0.0–0.4)
Alpha2 Glob SerPl Elph-Mcnc: 0.7 g/dL (ref 0.4–1.0)
B-Globulin SerPl Elph-Mcnc: 0.8 g/dL (ref 0.7–1.3)
Gamma Glob SerPl Elph-Mcnc: 0.7 g/dL (ref 0.4–1.8)
Globulin, Total: 2.4 g/dL (ref 2.2–3.9)
IgA: 125 mg/dL (ref 61–437)
IgG (Immunoglobin G), Serum: 821 mg/dL (ref 603–1613)
IgM (Immunoglobulin M), Srm: 27 mg/dL (ref 15–143)
M Protein SerPl Elph-Mcnc: 0.2 g/dL — ABNORMAL HIGH
Total Protein ELP: 5.9 g/dL — ABNORMAL LOW (ref 6.0–8.5)

## 2019-05-06 LAB — VITAMIN D 25 HYDROXY (VIT D DEFICIENCY, FRACTURES): Vit D, 25-Hydroxy: 30.8 ng/mL (ref 30.0–100.0)

## 2019-05-06 NOTE — Telephone Encounter (Signed)
Contacted patient in response to questions regarding plans for upcoming dental visit (extraction of broken tooth): per Dr. Irene Limbo, patient should inform dentist that he received Prolia injection on 3/24, and is currently taking Revlimid and Xarelto. The dentist may advise holding medications for a few days and may also prescribe antibiotics.  Gave patient information as directed. Patient verbalized understanding and states he will contact his dentist.

## 2019-05-07 ENCOUNTER — Telehealth: Payer: Self-pay | Admitting: Hematology

## 2019-05-07 NOTE — Telephone Encounter (Signed)
Per 5/13 schedule message converted 5/20 f/u to telephone visit per patient request. Left message for patient confirming telephone visit.

## 2019-05-11 NOTE — Progress Notes (Signed)
HEMATOLOGY/ONCOLOGY CLINIC NOTE  Date of Service: 05/12/2019    Patient Care Team: Mayra Neer, MD as PCP - General (Family Medicine) Debara Pickett Nadean Corwin, MD as PCP - Cardiology (Cardiology)  CHIEF COMPLAINTS/:   F/u for continued mx of Myeloma  HISTORY OF PRESENTING ILLNESS:   Justin [name] is a wonderful 78 y.o. 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  Patient has a history of hypertension, diabetes, GERD, gout, squamous cell skin cancers, elevated PSA, SVT status post ablation in May 2016, chronic back pain who has a history of recurrent CVA/TIA's. He apparently had a left hemispheric TIA June 2017 and has had recurrent) subcortical infarcts thought to be related to small vessel disease. He was noted to have a small PFO that was of questionable significance. He was initially on aspirin and then continued and aspirin + plavix for secondary stroke prevention. He follows with Dr. Antony Contras for his neurology cares.  An SPEP was done due to elevated total proteins of 8.9 and showed an M spike of 2 g/dL. He was also noted to have an elevated total protein of 8.6 in April 2017. He was referred to [place] for further evaluation of his M spike. Blood test did not reveal any hypercalcemia, no significant renal failure. He has been noted to have developed anemia since June the serum and his hemoglobin was 12.6 and is now down to the mid 10 range. He notes no focal bone pains at this time. Overt acute weight loss. No fevers no chills no night sweats.  CURRENT THERAPY:    Revlimid maintenance 73m po 3 weeks on 1 week off.  INTERVAL HISTORY   I connected with Justin Friendlyon 05/12/19 at 10:40 AM EDT by telephone and verified that I am speaking with the correct person using two identifiers.  I discussed the limitations, risks, security and privacy concerns of performing an evaluation and management service by telemedicine and  the availability of in-person appointments. I also discussed with the patient that there may be a patient responsible charge related to this service. The patient expressed understanding and agreed to proceed.  Other persons participating in the visit and their role in the encounter: none  Patient's location: his home Provider's location: my office at the [facility] called today for follow-up of his myeloma. The patient's last visit with [name] on 03/10/19.  The pt reports that he is "doing quite well." He notes that he had 2 weeks of loose bowel movements twice a day, which began 3 weeks ago, and resolved on its own. The pt notes that he concluded his off week of Revlimid about 4 days ago, however, as he had a tooth removed yesterday which he notes went very well, he has held resuming his Revlimid due to this. He notes that he also held his Xarelto for two days prior to this removal.  The pt notes that he decreased from 3072mAllopurinol to 10045mllopurinol in the interim and denies having a gout flare recently. He notes that his last gout flare was three years ago.   The pt notes that he was able to begin 2000 units Vitamin D3 daily replacement in the interim.  Lab results (05/05/19) of CBC w/diff and CMP is as follows: all values are WNL except for WBC at 2.2k, RBC at 3.57, HGB at 10.9, HCT at 33.8, PLT at  126k, ANC at 900, Glucose at 120, BUN at 27, Calcium at 8.0, Total Protein at 6.3, GFR at 55. 05/05/19 Vitamin D at 30.8 05/05/19 Vitamin B12 at 586 05/05/19 MMP revealed all values WNL except for Total Protein at 5.9 and M Protein at 0.2g  On review of systems, pt reports good energy levels, eating well, recent diarrhea, and denies concerns for infections, concerns for bleeding, and any other symptoms.   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 s/p RFCA on 05/01/15   Stroke ([place]) 2017   TIA and mild stroke; denies stroke symptoms since then     SURGICAL HISTORY: Past Surgical History:  Procedure Laterality Date   ATRIAL FIBRILLATION ABLATION     Dr. Lovena Le   BASAL CELL CARCINOMA EXCISION     off of back   ELECTROPHYSIOLOGIC STUDY N/A 05/01/2015   Procedure: SVT Ablation;  Surgeon: Evans Lance, MD;  Location: [facility];  Service: Cardiovascular;  Laterality: N/A;   EP IMPLANTABLE DEVICE N/A 06/04/2016   Procedure: Loop Recorder Insertion;  Surgeon: Thompson Grayer, MD;  Location: [facility];  Service: Cardiovascular;  Laterality: N/A;   INGUINAL HERNIA REPAIR Bilateral 06/16/2017   Procedure: LAPAROSCOPIC BILATERAL INGUINAL HERNIA REPAIR;  Surgeon: Clovis Riley, MD;  Location: [facility];  Service: General;  Laterality: Bilateral;   INSERTION OF MESH Bilateral 06/16/2017   Procedure: INSERTION OF MESH;  Surgeon: Clovis Riley, MD;  Location: [facility];  Service: General;  Laterality: Bilateral;   MASS EXCISION Right 07/28/2018   Procedure: EXCISION OF RIGHT FOREARM MASS;  Surgeon: Clovis Riley, MD;  Location: [facility];  Service: General;  Laterality: Right;   SQUAMOUS CELL CARCINOMA EXCISION     TEE WITHOUT CARDIOVERSION N/A 06/04/2016   Procedure: TRANSESOPHAGEAL ECHOCARDIOGRAM (TEE);  Surgeon: Pixie Casino, MD;  Location: [facility];  Service: Cardiovascular;  Laterality: N/A;    SOCIAL HISTORY: Social History   Socioeconomic History   Marital status: Married    Spouse name: Not on file   Number of children: Not on file   Years of education: Not on file   Highest education level: Not on file  Occupational History   Not  on file  Social Needs   Financial resource strain: Not on file   Food insecurity:    Worry: Not on file    Inability: Not on file   Transportation needs:    Medical: Not on file    Non-medical: Not on file  Tobacco Use   Smoking status: Never Smoker   Smokeless tobacco: Never Used  Substance and Sexual Activity   Alcohol use: No   Drug use: No   Sexual activity: Not on file  Lifestyle   Physical activity:    Days per week: Not on file    Minutes per session: Not on file   Stress: Not on file  Relationships   Social connections:    Talks on phone: Not on file    Gets together: Not on file  Attends religious service: Not on file    Active member of club or organization: Not on file    Attends meetings of clubs or organizations: Not on file    Relationship status: Not on file   Intimate partner violence:    Fear of current or ex partner: Not on file    Emotionally abused: Not on file    Physically abused: Not on file    Forced sexual activity: Not on file  Other Topics Concern   Not on file  Social History Narrative   Not on file    FAMILY HISTORY: Family History  Problem Relation Age of Onset   Stroke Mother    Hypertension Mother    Alzheimer's disease Father    Heart failure Father    Down syndrome Daughter     ALLERGIES:  is allergic to no known allergies.  MEDICATIONS:  Current Outpatient Medications  Medication Sig Dispense Refill   atorvastatin (LIPITOR) 40 MG tablet TAKE 1 TABLET EVERY MORNING (Patient taking differently: Take 40 mg by mouth daily. ) 90 tablet 4   cyanocobalamin 1000 MCG tablet Take 1,000 mcg by mouth daily.     ergocalciferol (VITAMIN D2) 1.25 MG (50000 UT) capsule Take 1 capsule (50,000 Units total) by mouth once a week. 12 capsule 3   fluorouracil (EFUDEX) 5 % cream Apply 1 application topically daily as needed (for skin cancer prevention).   0   gabapentin (NEURONTIN) 300 MG capsule Take 300 mg by mouth 3  (three) times daily.      glucose monitoring kit (FREESTYLE) monitoring kit 1 each by Does not apply route as needed for other.     lenalidomide (REVLIMID) 10 MG capsule Take 1 capsule (10 mg total) by mouth daily. Take for 21 days on, 7 days off, repeat every 28 days 21 capsule 0   metFORMIN (GLUCOPHAGE) 1000 MG tablet Take 1,000 mg by mouth daily with breakfast.     nitroGLYCERIN (NITROSTAT) 0.4 MG SL tablet Place 1 tablet (0.4 mg total) under the tongue every 5 (five) minutes as needed for chest pain (Max 3 doses in 15 mins.). 30 tablet 0   omeprazole (PRILOSEC) 20 MG capsule Take 20 mg by mouth every morning.      telmisartan-hydrochlorothiazide (MICARDIS HCT) 80-12.5 MG tablet Take 1 tablet by mouth daily. 90 tablet 3   traMADol (ULTRAM) 50 MG tablet Take 1 tablet by mouth as directed.     XARELTO 20 MG TABS tablet TAKE 1 TABLET DAILY WITH SUPPER 90 tablet 1   No current facility-administered medications for this visit.     REVIEW OF SYSTEMS:    A 10+ POINT REVIEW OF SYSTEMS WAS OBTAINED including neurology, dermatology, psychiatry, cardiac, respiratory, lymph, extremities, GI, GU, Musculoskeletal, constitutional, breasts, reproductive, HEENT.  All pertinent positives are noted in the HPI.  All others are negative.   PHYSICAL EXAMINATION:   ECOG PERFORMANCE STATUS: 2 - Symptomatic, <50% confined to bed  There were no vitals filed for this visit. There were no vitals filed for this visit. There is no height or weight on file to calculate BMI.  Phone Visit   LABORATORY DATA:  I have reviewed the data as listed. CBC Latest Ref Rng & Units 05/05/2019 03/03/2019 12/30/2018  WBC 4.0 - 10.5 K/uL 2.2(L) 2.6(L) 5.5  Hemoglobin 13.0 - 17.0 g/dL 10.9(L) 9.7(L) 11.5(L)  Hematocrit 39.0 - 52.0 % 33.8(L) 30.3(L) 34.8(L)  Platelets 150 - 400 K/uL 126(L) 102(L) 145(L)  ANC 2.0K  CMP  Latest Ref Rng & Units 05/05/2019 03/03/2019 12/30/2018  Glucose 70 - 99 mg/dL 120(H) 112(H) 98  BUN 8 - 23  mg/dL 27(H) 24(H) 20  Creatinine 0.61 - 1.24 mg/dL 1.23 1.25(H) 1.05  Sodium 135 - 145 mmol/L 138 140 143  Potassium 3.5 - 5.1 mmol/L 4.0 4.0 3.8  Chloride 98 - 111 mmol/L 106 103 104  CO2 22 - 32 mmol/L _0 Calcium 8.9 - 10.3 mg/dL 8.0(L) 9.0 9.7  Total Protein 6.5 - 8.1 g/dL 6.3(L) 6.2(L) 6.9  Total Bilirubin 0.3 - 1.2 mg/dL 0.6 0.9 0.6  Alkaline Phos 38 - 126 U/L 84 86 75  AST 15 - 41 U/L _1 ALT 0 - 44 U/L _2 Lab Results  Component Value Date   IRON 45 06/02/2017   TIBC 233 06/02/2017   IRONPCTSAT 19 (L) 06/02/2017   (Iron and TIBC)  Lab Results  Component Value Date   FERRITIN 308 08/26/2017         RADIOGRAPHIC STUDIES: I have personally reviewed the radiological images as listed and agreed with the findings in the report.       Bone Marrow Biopsy 12/24/2016 (Accession [id number])  Diagnosis Bone Marrow, Aspirate,Biopsy, and Clot, left iliac crest - HYPERCELLULAR BONE MARROW FOR AGE WITH PLASMA CELL NEOPLASM. - SEE COMMENT. PERIPHERAL BLOOD: - NORMOCYTIC-NORMOCHROMIC ANEMIA. - LEUKOPENIA.  DG Bone Survey Met (Accession [id number]) (Order [id number])  Imaging  Date: 11/28/2016 Department: [facility] Released By: Pricilla Riffle Authorizing: Brunetta Genera, MD  Exam Information   Status Exam Begun  Exam Ended   Final [99] 11/28/2016 11:59 AM 11/28/2016 12:36 PM  PACS Images   Show images for DG Bone Survey Met  Study Result   CLINICAL DATA:  Monoclonal gammopathy of unknown significance. History of squamous cell carcinoma excision, basal cell carcinoma excision.  EXAM: METASTATIC BONE SURVEY  COMPARISON:  CT neck, chest, abdomen and pelvis from 10/29/16, MRI of the head from 06/01/2016, CXR 10/27/2016, lumbar spine radiographs 06/24/2016  FINDINGS: Lateral skull: Small occipital lucency which may represent a normal arachnoid granulation posteriorly in the occiput. No definite  lytic abnormality.  Cervical spine AP and lateral: Disc space narrowing at C2-3, and from C4 through C7. C4-5, C5-6 and C6-7 uncovertebral joint spurring bilaterally. No lytic abnormality.  Thoracic spine AP and lateral: T8-9 right-sided osteophytes and to a lesser degree T9-10. No lytic abnormality. Slight multilevel lumbar thoracic disc space narrowing likely degenerative.  Lumbar spine AP and lateral: Disc space narrowing at L4-5 and to a greater extent L5-S1. No lytic disease. L3 through S1 facet sclerosis.  AP pelvis: Negative for lytic disease.  Bilateral upper and lower extremities shoulders through wrist and from the hips through ankle: Negative for lytic disease. Joint space narrowing of the femorotibial compartments both knees. Subchondral cyst of the right patella.  CXR: Clear lungs. Cardiac implantable monitoring device projects over the left heart. Aortic atherosclerosis. No lytic disease.  IMPRESSION: No findings suspicious for lytic disease.   Electronically Signed   By: Ashley Royalty M.D.   On: 11/28/2016 14:58     ASSESSMENT & PLAN:   78 y.o. 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 (12/2016) Peak M spike 2.6  Cytogenetics and Myeloma FISH panel.- trisomy 11 Patient has normocytic anemia without any other clear etiology. (>2g/dl lower that lower limit of  normal which is 13) which per criteria would place this in the Multiple myeloma category as opposed to Smoldering Multiple myeloma (Borderline criterion)  No overt renal failure or hypercalcemia at this time No focal bone pains though he has significant chronic back pain related to degenerative disc disease. Bone survey shows no overt lytic lesions.  10/16/18 CT Coronary Morph revealed Coronary artery calcium score 299 Agatston units, this places the patient in the 47th percentile for age and gender, suggesting  intermediate risk for future cardiac events. 2. Nonobstructive coronary disease.  12/08/18 Bone Density study revealed that the pt has osteoporosis  2) Anemia and thrombocytopenia -- related to treatment (Revlimid)   3) Significant Diarrhea and intermittent nausea- Resolved 09/04/18 Enteropathogenic E.coli detected -Ordered Levofloxacin   4) Grade 2 Neuropathy -- in b/l feet and halfway up lower leg without pain -Seconadry to [employer] -Will monitor closely and will reduce medication if needed  5) Right kidney cyst with nodularity -Reviewed the 01/05/19 US Abdomen which revealed Septated cyst arising from lower pole right kidney with a questionable solid focus within this lesion. This nodular area potentially could represent a small neoplasm developing within this complex cyst. Given this circumstance, pre and post contrast MR of the kidneys is advised to further evaluate. If there is a contraindication to MR, pre and post-contrast CT would be a reasonable alternative to further assess. Study otherwise unremarkable. -Pt will likely need urology referral and MRI, pt would like to discuss this further with his PCP before triggering a referral at this point  6) Borderline low B12 levels, previously 299 --- on replacement -  -05/05/19 Vitamin B12 at 586 -continue replacement.   7) recurrent CVA - thought to be related to small vessel disease as per neurology. Has a small PFO which could be an additional risk factor as well as his afib  8)  P afib Plan -Continue on Xarelto per cardiology.  9) H/o recurrent SCC -Underwent surgical excision on right forearm with Dr. Romana Juniper on 07/28/18. His surgical pathology showed evidence of Squamous Cell Carcinoma. Plan -no evidence of recurrence locally at the site of surgery at this time.  10) Lower extremity discomfort - improved, stable. Likely Discogenic pain -[place] on 12/2017 revealed no blood clot -Pt has established care with orthopedist and  PT.   PLAN: -Discussed pt labwork from 05/05/19; WBC a little lower at 2.2k with ANC at 900, PLT improved to 126k, HGB improved to 10.9. M Protein stable at 0.2g. Vitamin D at 30.8 and Vitamin B12 at 586. -Pt has just completed his "off week" of Revlimid but will continue hold Revlimid an additional week to allow neutropenia to improve. The pt will then resume maintenance '10mg'$  Revlimid in one week.. -Will check labs again in 4 weeks -Advised that pt begin holding Allopurinol as this could be playing a role in his cytopenias, and may necessitate holding his Revlimid further. Pt has not had a gout attack in 3 years. -Advised staying well hydrated -Resume Xarelto (pt held this for 4-5 days around his recent dental extraction, notes this went very well and has been healing well) -Continue Vitamin B complex -Will begin weekly 50k units of Vitamin D replacement, taken with milk or cheese -Prolia injections every 6 months to treat osteoporosis aggressively -Continue Vitamin B12 replacement  -Continue follow up with dermatology for regular skin checks -Recommended that the pt keep his legs well moisturized -Continue follow up with Dr. Lyman Bishop in cardiology  -Continue follow up with  urology regarding renal lesion -Recommended that the pt continue to eat well, drink at least 48-64 oz of water each day, and walk 20-30 minutes each day. -Recommend crowd avoidance, infection prevention strategies, and frequent hand washing -Will see the pt back in 8 weeks     11) Patient Active Problem List   Diagnosis Date Noted   Osteoporosis 03/10/2019   Multiple myeloma without remission ([place]) 03/10/2019   Chest pain 09/27/2018   Multiple myeloma ([place]) 09/27/2018   History of loop recorder 06/12/2017   Cryptogenic stroke ([place]) 10/28/2016   Gait abnormality    History of CVA (cerebrovascular accident)    History of TIA (transient ischemic attack)    Benign essential HTN    Paroxysmal SVT  (supraventricular tachycardia) (HCC)    Acute blood loss anemia    Acute ischemic stroke ([place])    CVA (cerebral vascular accident) ([place]) 10/26/2016   History of recent stroke 06/06/2016   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 Plz schedule labs 1 week prior to visit.   I discussed the assessment and treatment plan with the patient. The patient was provided an opportunity to ask questions and all were answered. The patient agreed with the plan and demonstrated an understanding of the instructions.   The patient was advised to call back or seek an in-person evaluation if the symptoms worsen or if the condition fails to improve as anticipated.  The total time spent in the appt was 25 minutes and more than 50% was on counseling and direct patient cares.   Sullivan Lone MD [place] AAHIVMS [facility] Hematology/Oncology Physician [facility]  (Office):       [phone] (Work cell):  [phone] (Fax):           [phone]  I, Baldwin [place], am acting as a scribe for Dr. Sullivan Lone.   .I have reviewed the above documentation for accuracy and completeness, and I agree with the above. Brunetta Genera MD

## 2019-05-12 ENCOUNTER — Inpatient Hospital Stay (HOSPITAL_BASED_OUTPATIENT_CLINIC_OR_DEPARTMENT_OTHER): Payer: Medicare Other | Admitting: Hematology

## 2019-05-12 ENCOUNTER — Ambulatory Visit: Payer: Medicare Other

## 2019-05-12 ENCOUNTER — Telehealth: Payer: Self-pay | Admitting: Hematology

## 2019-05-12 DIAGNOSIS — E119 Type 2 diabetes mellitus without complications: Secondary | ICD-10-CM

## 2019-05-12 DIAGNOSIS — I10 Essential (primary) hypertension: Secondary | ICD-10-CM | POA: Diagnosis not present

## 2019-05-12 DIAGNOSIS — C9000 Multiple myeloma not having achieved remission: Secondary | ICD-10-CM | POA: Diagnosis not present

## 2019-05-12 DIAGNOSIS — Z79899 Other long term (current) drug therapy: Secondary | ICD-10-CM | POA: Diagnosis not present

## 2019-05-12 MED ORDER — ERGOCALCIFEROL 1.25 MG (50000 UT) PO CAPS: 50000.0000 [IU] | ORAL_CAPSULE | ORAL | 3 refills | Status: DC

## 2019-05-12 NOTE — Telephone Encounter (Signed)
Scheduled appt per 5/20 los. ° °Patient aware of appt date and time. °

## 2019-05-12 NOTE — Progress Notes (Signed)
Carelink Summary Report / Loop Recorder 

## 2019-05-14 ENCOUNTER — Other Ambulatory Visit: Payer: Self-pay | Admitting: *Deleted

## 2019-05-14 DIAGNOSIS — C9000 Multiple myeloma not having achieved remission: Secondary | ICD-10-CM

## 2019-05-14 MED ORDER — LENALIDOMIDE 10 MG PO CAPS: 10.0000 mg | ORAL_CAPSULE | Freq: Every day | ORAL | 0 refills | Status: DC

## 2019-05-14 NOTE — Telephone Encounter (Signed)
Refilled Revlimid per Dr. Grier Mitts OV note 05/12/2019. Refill sent to [facility], [place]. [employer] auth# [id number], 05/14/2019

## 2019-06-07 ENCOUNTER — Ambulatory Visit (INDEPENDENT_AMBULATORY_CARE_PROVIDER_SITE_OTHER): Payer: Medicare Other | Admitting: *Deleted

## 2019-06-07 DIAGNOSIS — I639 Cerebral infarction, unspecified: Secondary | ICD-10-CM

## 2019-06-07 LAB — CUP PACEART REMOTE DEVICE CHECK
Date Time Interrogation Session: 20200613224027
Implantable Pulse Generator Implant Date: 20170613

## 2019-06-11 ENCOUNTER — Inpatient Hospital Stay: Payer: Medicare Other | Attending: Hematology

## 2019-06-11 ENCOUNTER — Other Ambulatory Visit: Payer: Self-pay

## 2019-06-11 DIAGNOSIS — D225 Melanocytic nevi of trunk: Secondary | ICD-10-CM | POA: Diagnosis not present

## 2019-06-11 DIAGNOSIS — C9000 Multiple myeloma not having achieved remission: Secondary | ICD-10-CM | POA: Diagnosis not present

## 2019-06-11 DIAGNOSIS — L814 Other melanin hyperpigmentation: Secondary | ICD-10-CM | POA: Diagnosis not present

## 2019-06-11 DIAGNOSIS — Z86018 Personal history of other benign neoplasm: Secondary | ICD-10-CM | POA: Diagnosis not present

## 2019-06-11 DIAGNOSIS — D485 Neoplasm of uncertain behavior of skin: Secondary | ICD-10-CM | POA: Diagnosis not present

## 2019-06-11 DIAGNOSIS — L570 Actinic keratosis: Secondary | ICD-10-CM | POA: Diagnosis not present

## 2019-06-11 DIAGNOSIS — Z85828 Personal history of other malignant neoplasm of skin: Secondary | ICD-10-CM | POA: Diagnosis not present

## 2019-06-11 DIAGNOSIS — L821 Other seborrheic keratosis: Secondary | ICD-10-CM | POA: Diagnosis not present

## 2019-06-11 LAB — CBC WITH DIFFERENTIAL/PLATELET
Abs Immature Granulocytes: 0 10*3/uL (ref 0.00–0.07)
Basophils Absolute: 0 10*3/uL (ref 0.0–0.1)
Basophils Relative: 2 %
Eosinophils Absolute: 0.1 10*3/uL (ref 0.0–0.5)
Eosinophils Relative: 4 %
HCT: 32.7 % — ABNORMAL LOW (ref 39.0–52.0)
Hemoglobin: 10.6 g/dL — ABNORMAL LOW (ref 13.0–17.0)
Immature Granulocytes: 0 %
Lymphocytes Relative: 35 %
Lymphs Abs: 0.7 10*3/uL (ref 0.7–4.0)
MCH: 30.7 pg (ref 26.0–34.0)
MCHC: 32.4 g/dL (ref 30.0–36.0)
MCV: 94.8 fL (ref 80.0–100.0)
Monocytes Absolute: 0.3 10*3/uL (ref 0.1–1.0)
Monocytes Relative: 13 %
Neutro Abs: 1 10*3/uL — ABNORMAL LOW (ref 1.7–7.7)
Neutrophils Relative %: 46 %
Platelets: 116 10*3/uL — ABNORMAL LOW (ref 150–400)
RBC: 3.45 MIL/uL — ABNORMAL LOW (ref 4.22–5.81)
RDW: 14.6 % (ref 11.5–15.5)
WBC: 2.1 10*3/uL — ABNORMAL LOW (ref 4.0–10.5)
nRBC: 0 % (ref 0.0–0.2)

## 2019-06-15 NOTE — Progress Notes (Signed)
Carelink Summary Report / Loop Recorder 

## 2019-06-16 ENCOUNTER — Ambulatory Visit: Payer: Medicare Other

## 2019-07-02 ENCOUNTER — Other Ambulatory Visit: Payer: Self-pay | Admitting: *Deleted

## 2019-07-02 DIAGNOSIS — C9000 Multiple myeloma not having achieved remission: Secondary | ICD-10-CM

## 2019-07-02 MED ORDER — LENALIDOMIDE 10 MG PO CAPS: 10.0000 mg | ORAL_CAPSULE | Freq: Every day | ORAL | 0 refills | Status: DC

## 2019-07-02 NOTE — Telephone Encounter (Signed)
Refilled Revlimid per Dr. Grier Mitts OV note 05/12/2019. Refill sent to [facility], [place]. [employer] Auth# [id number], 07/02/2019

## 2019-07-05 ENCOUNTER — Inpatient Hospital Stay: Payer: Medicare Other | Attending: Hematology

## 2019-07-05 ENCOUNTER — Other Ambulatory Visit: Payer: Self-pay

## 2019-07-05 DIAGNOSIS — D6959 Other secondary thrombocytopenia: Secondary | ICD-10-CM | POA: Insufficient documentation

## 2019-07-05 DIAGNOSIS — C9000 Multiple myeloma not having achieved remission: Secondary | ICD-10-CM | POA: Insufficient documentation

## 2019-07-05 DIAGNOSIS — Z79899 Other long term (current) drug therapy: Secondary | ICD-10-CM | POA: Insufficient documentation

## 2019-07-05 DIAGNOSIS — R197 Diarrhea, unspecified: Secondary | ICD-10-CM | POA: Diagnosis not present

## 2019-07-05 DIAGNOSIS — I480 Paroxysmal atrial fibrillation: Secondary | ICD-10-CM | POA: Diagnosis not present

## 2019-07-05 DIAGNOSIS — Z85828 Personal history of other malignant neoplasm of skin: Secondary | ICD-10-CM | POA: Diagnosis not present

## 2019-07-05 DIAGNOSIS — Z7901 Long term (current) use of anticoagulants: Secondary | ICD-10-CM | POA: Diagnosis not present

## 2019-07-05 DIAGNOSIS — I10 Essential (primary) hypertension: Secondary | ICD-10-CM | POA: Insufficient documentation

## 2019-07-05 DIAGNOSIS — E119 Type 2 diabetes mellitus without complications: Secondary | ICD-10-CM | POA: Insufficient documentation

## 2019-07-05 DIAGNOSIS — N281 Cyst of kidney, acquired: Secondary | ICD-10-CM | POA: Insufficient documentation

## 2019-07-05 DIAGNOSIS — G620 Drug-induced polyneuropathy: Secondary | ICD-10-CM | POA: Insufficient documentation

## 2019-07-05 DIAGNOSIS — D6481 Anemia due to antineoplastic chemotherapy: Secondary | ICD-10-CM | POA: Diagnosis not present

## 2019-07-05 DIAGNOSIS — Z8673 Personal history of transient ischemic attack (TIA), and cerebral infarction without residual deficits: Secondary | ICD-10-CM | POA: Diagnosis not present

## 2019-07-05 LAB — CBC WITH DIFFERENTIAL/PLATELET
Abs Immature Granulocytes: 0.01 10*3/uL (ref 0.00–0.07)
Basophils Absolute: 0 10*3/uL (ref 0.0–0.1)
Basophils Relative: 0 %
Eosinophils Absolute: 0 10*3/uL (ref 0.0–0.5)
Eosinophils Relative: 0 %
HCT: 31.5 % — ABNORMAL LOW (ref 39.0–52.0)
Hemoglobin: 10.3 g/dL — ABNORMAL LOW (ref 13.0–17.0)
Immature Granulocytes: 0 %
Lymphocytes Relative: 12 %
Lymphs Abs: 0.4 10*3/uL — ABNORMAL LOW (ref 0.7–4.0)
MCH: 30.5 pg (ref 26.0–34.0)
MCHC: 32.7 g/dL (ref 30.0–36.0)
MCV: 93.2 fL (ref 80.0–100.0)
Monocytes Absolute: 0.8 10*3/uL (ref 0.1–1.0)
Monocytes Relative: 22 %
Neutro Abs: 2.2 10*3/uL (ref 1.7–7.7)
Neutrophils Relative %: 66 %
Platelets: 118 10*3/uL — ABNORMAL LOW (ref 150–400)
RBC: 3.38 MIL/uL — ABNORMAL LOW (ref 4.22–5.81)
RDW: 14.7 % (ref 11.5–15.5)
WBC: 3.4 10*3/uL — ABNORMAL LOW (ref 4.0–10.5)
nRBC: 0 % (ref 0.0–0.2)

## 2019-07-05 LAB — CMP (CANCER CENTER ONLY)
ALT: 14 U/L (ref 0–44)
AST: 21 U/L (ref 15–41)
Albumin: 3.8 g/dL (ref 3.5–5.0)
Alkaline Phosphatase: 47 U/L (ref 38–126)
Anion gap: 10 (ref 5–15)
BUN: 37 mg/dL — ABNORMAL HIGH (ref 8–23)
CO2: 23 mmol/L (ref 22–32)
Calcium: 8.5 mg/dL — ABNORMAL LOW (ref 8.9–10.3)
Chloride: 101 mmol/L (ref 98–111)
Creatinine: 1.28 mg/dL — ABNORMAL HIGH (ref 0.61–1.24)
GFR, Est AFR Am: >60 mL/min (ref >60)
GFR, Estimated: 53 mL/min — ABNORMAL LOW (ref >60)
Glucose, Bld: 117 mg/dL — ABNORMAL HIGH (ref 70–99)
Potassium: 4.4 mmol/L (ref 3.5–5.1)
Sodium: 134 mmol/L — ABNORMAL LOW (ref 135–145)
Total Bilirubin: 0.7 mg/dL (ref 0.3–1.2)
Total Protein: 6.5 g/dL (ref 6.5–8.1)

## 2019-07-06 LAB — MULTIPLE MYELOMA PANEL, SERUM
Albumin SerPl Elph-Mcnc: 3.7 g/dL (ref 2.9–4.4)
Albumin/Glob SerPl: 1.7 (ref 0.7–1.7)
Alpha 1: 0.2 g/dL (ref 0.0–0.4)
Alpha2 Glob SerPl Elph-Mcnc: 0.7 g/dL (ref 0.4–1.0)
B-Globulin SerPl Elph-Mcnc: 0.8 g/dL (ref 0.7–1.3)
Gamma Glob SerPl Elph-Mcnc: 0.7 g/dL (ref 0.4–1.8)
Globulin, Total: 2.3 g/dL (ref 2.2–3.9)
IgA: 82 mg/dL (ref 61–437)
IgG (Immunoglobin G), Serum: 805 mg/dL (ref 603–1613)
IgM (Immunoglobulin M), Srm: 26 mg/dL (ref 15–143)
M Protein SerPl Elph-Mcnc: 0.1 g/dL — ABNORMAL HIGH
Total Protein ELP: 6 g/dL (ref 6.0–8.5)

## 2019-07-08 ENCOUNTER — Ambulatory Visit (INDEPENDENT_AMBULATORY_CARE_PROVIDER_SITE_OTHER): Payer: Medicare Other | Admitting: *Deleted

## 2019-07-08 DIAGNOSIS — I639 Cerebral infarction, unspecified: Secondary | ICD-10-CM

## 2019-07-09 LAB — CUP PACEART REMOTE DEVICE CHECK
Date Time Interrogation Session: 20200716234136
Implantable Pulse Generator Implant Date: 20170613

## 2019-07-11 NOTE — Progress Notes (Signed)
HEMATOLOGY/ONCOLOGY CLINIC NOTE  Date of Service: 07/12/2019   Patient Care Team: Justin Neer, MD as PCP - General (Family Medicine) Justin Pickett Nadean Corwin, MD as PCP - Cardiology (Cardiology)   CHIEF COMPLAINTS/:  F/u for continued mx of Myeloma   HISTORY OF PRESENTING ILLNESS:  Justin [name] is a wonderful 78 y.o. 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  Patient has a history of hypertension, diabetes, GERD, gout, squamous cell skin cancers, elevated PSA, SVT status post ablation in May 2016, chronic back pain who has a history of recurrent CVA/TIA's. He apparently had a left hemispheric TIA June 2017 and has had recurrent) subcortical infarcts thought to be related to small vessel disease. He was noted to have a small PFO that was of questionable significance. He was initially on aspirin and then continued and aspirin + plavix for secondary stroke prevention. He follows with Dr. Antony [name] for his neurology cares.  An SPEP was done due to elevated total proteins of 8.9 and showed an M spike of 2 g/dL. He was also noted to have an elevated total protein of 8.6 in April 2017. He was referred to [place] for further evaluation of his M spike. Blood test did not reveal any hypercalcemia, no significant renal failure. He has been noted to have developed anemia since June the serum and his hemoglobin was 12.6 and is now down to the mid 10 range. He notes no focal bone pains at this time. Overt acute weight loss. No fevers no chills no night sweats.   CURRENT THERAPY:   Revlimid maintenance 32m po 3 weeks on 1 week off.   INTERVAL HISTORY   WRikki Smestadwas seen today for follow-up of his myeloma. The patient's last visit with [name] on 05/12/2019. The pt reports that he is doing well overall.  He continues with Revlimid. He is currently in his off week. The pt has no prohibitive toxicities from continuing this treatment  at this time.   The pt reports that he continues to have diarrhea, but it is inconsistent and mild. He hasn't treated his diarrhea with any OTC medications. He was recently stung by several wasps, but he seems to not have has any allergic reactions. He is taking appropriate precautions against the spread of the virus.   Lab results from 07/05/2019 of CBC w/diff and CMP is as follows: all values are WNL except for WBC at 3.4K, RBC at 3.38, hemoglobin at 10.3, HCT at 31.5, platelets at 118, lymphs abs at 0.4, sodium at 134, glucose at 117, BUN at 37, creatinine at 1.28, calcium at 8.5, and GRF est no af at 53. MMP from 07/05/2019 shows an M protein of 0.1.   On review of systems, pt reports diarrhea and denies mouth sores, fever, or any other symptoms.      MEDICAL HISTORY:  Past Medical History:  Diagnosis Date  . Anemia   . Arthritis   . 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 Diabetes mellitus without complication ([place]   . GERD (gastroesophageal reflux disease)   . Gout   . History of loop recorder   . Hypertension   . Left leg weakness   . Multiple myeloma ([place] 09/27/2018  . Paroxysmal SVT (supraventricular tachycardia) ([place]    a. s/p RFCA on 05/01/15  . Stroke ([facility] 2017   TIA and mild  stroke; denies stroke symptoms since then     SURGICAL HISTORY: Past Surgical History:  Procedure Laterality Date  . ATRIAL FIBRILLATION ABLATION     Justin [name]  . BASAL CELL CARCINOMA EXCISION     off of back  . ELECTROPHYSIOLOGIC STUDY N/A 05/01/2015   Procedure: SVT Ablation;  Surgeon: Justin Lance, MD;  Location: [place] [facility];  Service: Cardiovascular;  Laterality: N/A;  . EP IMPLANTABLE DEVICE N/A 06/04/2016   Procedure: Loop Recorder Insertion;  Surgeon: Justin Grayer, MD;  Location: [place] [facility];  Service: Cardiovascular;  Laterality: N/A;  . INGUINAL HERNIA REPAIR Bilateral 06/16/2017   Procedure: LAPAROSCOPIC BILATERAL INGUINAL HERNIA  REPAIR;  Surgeon: Justin Riley, MD;  Location: [place];  Service: General;  Laterality: Bilateral;  . INSERTION OF MESH Bilateral 06/16/2017   Procedure: INSERTION OF MESH;  Surgeon: Justin Riley, MD;  Location: [place];  Service: General;  Laterality: Bilateral;  . MASS EXCISION Right 07/28/2018   Procedure: EXCISION OF RIGHT FOREARM MASS;  Surgeon: Justin Riley, MD;  Location: [facility];  Service: General;  Laterality: Right;  . SQUAMOUS CELL CARCINOMA EXCISION    . TEE WITHOUT CARDIOVERSION N/A 06/04/2016   Procedure: TRANSESOPHAGEAL ECHOCARDIOGRAM (TEE);  Surgeon: Pixie Casino, MD;  Location: [facility];  Service: Cardiovascular;  Laterality: N/A;    SOCIAL HISTORY: Social History   Socioeconomic History  . Marital status: Married    Spouse name: Not on file  . Number of children: Not on file  . Years of education: Not on file  . Highest education level: Not on file  Occupational History  . Not on file  Social Needs  . Financial resource strain: Not on file  . Food insecurity    Worry: Not on file    Inability: Not on file  . Transportation needs    Medical: Not on file    Non-medical: Not on file  Tobacco Use  . Smoking status: Never Smoker  . Smokeless tobacco: Never Used  Substance and Sexual Activity  . Alcohol use: No  . Drug use: No  . Sexual activity: Not on file  Lifestyle  . Physical activity    Days per week: Not on file    Minutes per session: Not on file  . Stress: Not on file  Relationships  . Social Herbalist on phone: Not on file    Gets together: Not on file    Attends religious service: Not on file    Active member of club or organization: Not on file    Attends meetings of clubs or organizations: Not on file    Relationship status: Not on file  . Intimate partner violence    Fear of current or ex partner: Not on file    Emotionally abused: Not on file    Physically abused: Not on file    Forced sexual activity: Not on file   Other Topics Concern  . Not on file  Social History Narrative  . Not on file   Justin [name] is retired. His daughter is Justin [name] of nursing for a nursing home in [place], [place].    FAMILY HISTORY: Family History  Problem Relation Age of Onset  . Stroke Mother   . Hypertension Mother   . Alzheimer's disease Father   . Heart failure Father   . Down syndrome Daughter     ALLERGIES:  is allergic to no known allergies.  MEDICATIONS:  Current  Outpatient Medications  Medication Sig Dispense Refill  . atorvastatin (LIPITOR) 40 MG tablet TAKE 1 TABLET EVERY MORNING (Patient taking differently: Take 40 mg by mouth daily. ) 90 tablet 4  . cyanocobalamin 1000 MCG tablet Take 1,000 mcg by mouth daily.    . ergocalciferol (VITAMIN D2) 1.25 MG (50000 UT) capsule Take 1 capsule (50,000 Units total) by mouth once a week. 12 capsule 3  . fluorouracil (EFUDEX) 5 % cream Apply 1 application topically daily as needed (for skin cancer prevention).   0  . gabapentin (NEURONTIN) 300 MG capsule Take 300 mg by mouth 3 (three) times daily.     [name] Kitchen glucose monitoring kit (FREESTYLE) monitoring kit 1 each by Does not apply route as needed for other.    . lenalidomide (REVLIMID) 10 MG capsule Take 1 capsule (10 mg total) by mouth daily. Take for 21 days on, 7 days off, repeat every 28 days 21 capsule 0  . metFORMIN (GLUCOPHAGE) 1000 MG tablet Take 1,000 mg by mouth daily with breakfast.    . nitroGLYCERIN (NITROSTAT) 0.4 MG SL tablet Place 1 tablet (0.4 mg total) under the tongue every 5 (five) minutes as needed for chest pain (Max 3 doses in 15 mins.). 30 tablet 0  . omeprazole (PRILOSEC) 20 MG capsule Take 20 mg by mouth every morning.     [name] Kitchen telmisartan-hydrochlorothiazide (MICARDIS HCT) 80-12.5 MG tablet Take 1 tablet by mouth daily. 90 tablet 3  . traMADol (ULTRAM) 50 MG tablet Take 1 tablet by mouth as directed.    Alveda Reasons 20 MG TABS tablet TAKE 1 TABLET DAILY WITH SUPPER 90 tablet 1   No current  facility-administered medications for this visit.     REVIEW OF SYSTEMS:   A 10+ POINT REVIEW OF SYSTEMS WAS OBTAINED including neurology, dermatology, psychiatry, cardiac, respiratory, lymph, extremities, GI, GU, Musculoskeletal, constitutional, breasts, reproductive, HEENT.  All pertinent positives are noted in the HPI.  All others are negative.    PHYSICAL EXAMINATION:   ECOG PERFORMANCE STATUS: 2 - Symptomatic, <50% confined to bed Vitals:   07/12/19 1025  BP: (!) 101/52  Pulse: (!) 50  Resp: 18  Temp: 98.3 F (36.8 C)  SpO2: 100%    Wt Readings from Last 3 Encounters:  07/12/19 172 lb 8 oz (78.2 kg)  03/22/19 174 lb (78.9 kg)  03/10/19 178 lb 6.4 oz (80.9 kg)   Body mass index is 26.23 kg/m.   GENERAL:alert, in no acute distress and comfortable SKIN: no acute rashes, no significant lesions EYES: conjunctiva are pink and non-injected, sclera anicteric OROPHARYNX: MMM, no exudates, no oropharyngeal erythema or ulceration NECK: supple, no JVD LYMPH:  no palpable lymphadenopathy in the cervical, axillary or inguinal regions LUNGS: clear to auscultation b/l with normal respiratory effort HEART: regular rate & rhythm ABDOMEN:  normoactive bowel sounds , non tender, not distended. Extremity: no pedal edema PSYCH: alert & oriented x 3 with fluent speech NEURO: no focal motor/sensory deficits     LABORATORY DATA:  I have reviewed the data as listed. CBC Latest Ref Rng & Units 07/05/2019 06/11/2019 05/05/2019  WBC 4.0 - 10.5 K/uL 3.4(L) 2.1(L) 2.2(L)  Hemoglobin 13.0 - 17.0 g/dL 10.3(L) 10.6(L) 10.9(L)  Hematocrit 39.0 - 52.0 % 31.5(L) 32.7(L) 33.8(L)  Platelets 150 - 400 K/uL 118(L) 116(L) 126(L)  ANC 2.0K  CMP Latest Ref Rng & Units 07/05/2019 05/05/2019 03/03/2019  Glucose 70 - 99 mg/dL 117(H) 120(H) 112(H)  BUN 8 - 23 mg/dL 37(H) 27(H) 24(H)  Creatinine  0.61 - 1.24 mg/dL 1.28(H) 1.23 1.25(H)  Sodium 135 - 145 mmol/L 134(L) 138 140  Potassium 3.5 - 5.1 mmol/L 4.4 4.0  4.0  Chloride 98 - 111 mmol/L 101 106 103  CO2 22 - 32 mmol/L 23 24 27   Calcium 8.9 - 10.3 mg/dL 8.5(L) 8.0(L) 9.0  Total Protein 6.5 - 8.1 g/dL 6.5 6.3(L) 6.2(L)  Total Bilirubin 0.3 - 1.2 mg/dL 0.7 0.6 0.9  Alkaline Phos 38 - 126 U/L 47 84 86  AST 15 - 41 U/L 21 19 20   ALT 0 - 44 U/L 14 11 8     Lab Results  Component Value Date   IRON 45 06/02/2017   TIBC 233 06/02/2017   IRONPCTSAT 19 (L) 06/02/2017   (Iron and TIBC)  Lab Results  Component Value Date   FERRITIN 308 08/26/2017         RADIOGRAPHIC STUDIES: I have personally reviewed the radiological images as listed and agreed with the findings in the report.       Bone Marrow Biopsy 12/24/2016 (Accession [id number])  Diagnosis Bone Marrow, Aspirate,Biopsy, and Clot, left iliac crest - HYPERCELLULAR BONE MARROW FOR AGE WITH PLASMA CELL NEOPLASM. - SEE COMMENT. PERIPHERAL BLOOD: - NORMOCYTIC-NORMOCHROMIC ANEMIA. - LEUKOPENIA.  DG Bone Survey Met (Accession [id number]) (Order [id number])  Imaging  Date: 11/28/2016 Department: [facility] Released By: Pricilla Riffle Authorizing: Brunetta Genera, MD  Exam Information   Status Exam Begun  Exam Ended   Final [99] 11/28/2016 11:59 AM 11/28/2016 12:36 PM  PACS Images   Show images for DG Bone Survey Met  Study Result   CLINICAL DATA:  Monoclonal gammopathy of unknown significance. History of squamous cell carcinoma excision, basal cell carcinoma excision.  EXAM: METASTATIC BONE SURVEY  COMPARISON:  CT neck, chest, abdomen and pelvis from 10/29/16, MRI of the head from 06/01/2016, CXR 10/27/2016, lumbar spine radiographs 06/24/2016  FINDINGS: Lateral skull: Small occipital lucency which may represent a normal arachnoid granulation posteriorly in the occiput. No definite lytic abnormality.  Cervical spine AP and lateral: Disc space narrowing at C2-3, and from C4 through C7. C4-5, C5-6 and C6-7 uncovertebral  joint spurring bilaterally. No lytic abnormality.  Thoracic spine AP and lateral: T8-9 right-sided osteophytes and to a lesser degree T9-10. No lytic abnormality. Slight multilevel lumbar thoracic disc space narrowing likely degenerative.  Lumbar spine AP and lateral: Disc space narrowing at L4-5 and to a greater extent L5-S1. No lytic disease. L3 through S1 facet sclerosis.  AP pelvis: Negative for lytic disease.  Bilateral upper and lower extremities shoulders through wrist and from the hips through ankle: Negative for lytic disease. Joint space narrowing of the femorotibial compartments both knees. Subchondral cyst of the right patella.  CXR: Clear lungs. Cardiac implantable monitoring device projects over the left heart. Aortic atherosclerosis. No lytic disease.  IMPRESSION: No findings suspicious for lytic disease.   Electronically Signed   By: Ashley Royalty M.D.   On: 11/28/2016 14:58     ASSESSMENT & PLAN:   78 y.o. 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 (12/2016) Peak M spike 2.6  Cytogenetics and Myeloma FISH panel.- trisomy 11 Patient has normocytic anemia without any other clear etiology. (>2g/dl lower that lower limit of normal which is 13) which per criteria would place this in the Multiple myeloma category as opposed to Smoldering Multiple myeloma (Borderline criterion)  No overt renal failure  or hypercalcemia at this time No focal bone pains though he has significant chronic back pain related to degenerative disc disease. Bone survey shows no overt lytic lesions.  10/16/18 CT Coronary Morph revealed Coronary artery calcium score 299 Agatston units, this places the patient in the 47th percentile for age and gender, suggesting intermediate risk for future cardiac events. 2. Nonobstructive coronary disease.  12/08/18 Bone Density study revealed that the pt has osteoporosis   2) Anemia and thrombocytopenia -- related to treatment (Revlimid) -stable  3) Grade 2 Neuropathy -- in b/l feet and halfway up lower leg without pain -Seconadry to [employer] -improved of [place]  5) Right kidney cyst with nodularity -Reviewed the 01/05/19 US Abdomen which revealed Septated cyst arising from lower pole right kidney with a questionable solid focus within this lesion. This nodular area potentially could represent a small neoplasm developing within this complex cyst. Given this circumstance, pre and post contrast MR of the kidneys is advised to further evaluate. If there is a contraindication to MR, pre and post-contrast CT would be a reasonable alternative to further assess. Study otherwise unremarkable. -Pt will likely need urology referral and MRI, pt would like to discuss this further with his PCP before triggering a referral at this point  6) Borderline low B12 levels, previously 299 --- on replacement -  -05/05/19 Vitamin B12 at 586 -continue replacement.   7) recurrent CVA - thought to be related to small vessel disease as per neurology. Has a small PFO which could be an additional risk factor as well as his afib  8)  P afib Plan -Continue on Xarelto per cardiology.  9) H/o recurrent SCC -Underwent surgical excision on right forearm with Dr. Romana Juniper on 07/28/18. His surgical pathology showed evidence of Squamous Cell Carcinoma. Plan -no evidence of recurrence locally at the site of surgery at this time. -continue dermatology followup  10) Lower extremity discomfort - improved, stable. Likely Discogenic pain -[place] on 12/2017 revealed no blood clot -Pt has established care with orthopedist and PT.  11) Patient Active Problem List   Diagnosis Date Noted  . Osteoporosis 03/10/2019  . Multiple myeloma without remission ([place]) 03/10/2019  . Chest pain 09/27/2018  . Multiple myeloma ([place]) 09/27/2018  . History of loop recorder 06/12/2017  . Cryptogenic stroke ([place])  10/28/2016  . Gait abnormality   . History of CVA (cerebrovascular accident)   . History of TIA (transient ischemic attack)   . Benign essential HTN   . Paroxysmal SVT (supraventricular tachycardia) ([place])   . Acute blood loss anemia   . Acute ischemic stroke ([place])   . CVA (cerebral vascular accident) ([place]) 10/26/2016  . History of recent stroke 06/06/2016  . 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 TIA (transient ischemic attack) 06/02/2016  . Numbness 06/01/2016  . Right arm numbness 06/01/2016  . Atherosclerosis of native coronary artery of native heart without angina pectoris 03/20/2016  . Hypertension   . GERD (gastroesophageal reflux disease)   . Gout   . S/P RF ablation operation for arrhythmia 12/13/2014  . Hyperlipidemia 12/13/2014  . Controlled type 2 diabetes mellitus with complication, without long-term current use of insulin ([place]) 12/07/2014    PLAN -Discussed pt labwork today, 07/12/19; all values are WNL except for WBC at 3.4K, RBC at 3.38, hemoglobin at 10.3, HCT at 31.5, platelets at 118, lymphs abs at 0.4, sodium at 134, glucose at 117, BUN at 37, creatinine at 1.28, calcium at 8.5, and GRF est no af  at 50. -Discussed MMP from 07/05/2019 shows an M protein of 0.1.  -Discussed dexamethazone treatment in relationship with COVID and it not working for [employer] prevention. -Discussed intake of gabapentin to assist with neuropathy. -Discussed pandemic safety and infection prevention techniques. -The pt has no prohibitive toxicities from continuing Revlimid at this time.  -Discussed follow up with dermatologist every 6 months -Return in 2 months for labs and Prolia.    FOLLOW UP: F/u for Prolia shot as scheduled on 9/23 Webex visit with Dr Irene Limbo in 2 months, labs 1 week prior to clinic visit    I discussed the assessment and treatment plan with the patient. The patient was provided an opportunity to ask questions and all were answered. The patient agreed with the  plan and demonstrated an understanding of the instructions.   The patient was advised to call back or seek an in-person evaluation if the symptoms worsen or if the condition fails to improve as anticipated.  The total time spent in the appt was 25 minutes and more than 50% was on counseling and direct patient cares.   Sullivan Lone MD MS AAHIVMS [facility] Hematology/Oncology Physician [facility]  (Office):       [phone] (Work cell):  [phone] (Fax):           [phone]  I, Jacqualyn Posey, am acting as a scribe for Dr. Sullivan Lone.   .I have reviewed the above documentation for accuracy and completeness, and I agree with the above. Brunetta Genera MD

## 2019-07-12 ENCOUNTER — Telehealth: Payer: Self-pay | Admitting: Hematology

## 2019-07-12 ENCOUNTER — Other Ambulatory Visit: Payer: Self-pay

## 2019-07-12 ENCOUNTER — Inpatient Hospital Stay (HOSPITAL_BASED_OUTPATIENT_CLINIC_OR_DEPARTMENT_OTHER): Payer: Medicare Other | Admitting: Hematology

## 2019-07-12 VITALS — BP 101/52 | HR 50 | Temp 98.3°F | Resp 18 | Ht 68.0 in | Wt 172.5 lb

## 2019-07-12 DIAGNOSIS — R197 Diarrhea, unspecified: Secondary | ICD-10-CM

## 2019-07-12 DIAGNOSIS — I480 Paroxysmal atrial fibrillation: Secondary | ICD-10-CM | POA: Diagnosis not present

## 2019-07-12 DIAGNOSIS — G620 Drug-induced polyneuropathy: Secondary | ICD-10-CM | POA: Diagnosis not present

## 2019-07-12 DIAGNOSIS — E119 Type 2 diabetes mellitus without complications: Secondary | ICD-10-CM

## 2019-07-12 DIAGNOSIS — Z7901 Long term (current) use of anticoagulants: Secondary | ICD-10-CM

## 2019-07-12 DIAGNOSIS — Z8673 Personal history of transient ischemic attack (TIA), and cerebral infarction without residual deficits: Secondary | ICD-10-CM

## 2019-07-12 DIAGNOSIS — N281 Cyst of kidney, acquired: Secondary | ICD-10-CM | POA: Diagnosis not present

## 2019-07-12 DIAGNOSIS — D6481 Anemia due to antineoplastic chemotherapy: Secondary | ICD-10-CM | POA: Diagnosis not present

## 2019-07-12 DIAGNOSIS — C9000 Multiple myeloma not having achieved remission: Secondary | ICD-10-CM | POA: Diagnosis not present

## 2019-07-12 DIAGNOSIS — Z79899 Other long term (current) drug therapy: Secondary | ICD-10-CM

## 2019-07-12 DIAGNOSIS — I10 Essential (primary) hypertension: Secondary | ICD-10-CM

## 2019-07-12 DIAGNOSIS — D6959 Other secondary thrombocytopenia: Secondary | ICD-10-CM | POA: Diagnosis not present

## 2019-07-12 DIAGNOSIS — Z85828 Personal history of other malignant neoplasm of skin: Secondary | ICD-10-CM | POA: Diagnosis not present

## 2019-07-12 NOTE — Telephone Encounter (Signed)
Scheduled appt per 7/20 los.  Spoke with patient and he is aware of his appt date and time.

## 2019-07-14 ENCOUNTER — Ambulatory Visit: Payer: Medicare Other

## 2019-07-15 NOTE — Progress Notes (Signed)
Carelink Summary Report / Loop Recorder 

## 2019-07-28 ENCOUNTER — Other Ambulatory Visit: Payer: Self-pay | Admitting: *Deleted

## 2019-07-28 DIAGNOSIS — C9000 Multiple myeloma not having achieved remission: Secondary | ICD-10-CM

## 2019-07-28 MED ORDER — LENALIDOMIDE 10 MG PO CAPS: 10.0000 mg | ORAL_CAPSULE | Freq: Every day | ORAL | 0 refills | Status: DC

## 2019-07-28 NOTE — Telephone Encounter (Signed)
Refilled Revlimid per Dr. Irene Limbo OV note 07/12/2019 Refill sent to [place], [place] Auth# [id number], 07/28/2019

## 2019-08-10 ENCOUNTER — Ambulatory Visit (INDEPENDENT_AMBULATORY_CARE_PROVIDER_SITE_OTHER): Payer: Medicare Other | Admitting: *Deleted

## 2019-08-10 DIAGNOSIS — I639 Cerebral infarction, unspecified: Secondary | ICD-10-CM | POA: Diagnosis not present

## 2019-08-11 LAB — CUP PACEART REMOTE DEVICE CHECK
Date Time Interrogation Session: 20200819014203
Implantable Pulse Generator Implant Date: 20170613

## 2019-08-18 ENCOUNTER — Ambulatory Visit: Payer: Medicare Other

## 2019-08-18 ENCOUNTER — Other Ambulatory Visit: Payer: Self-pay | Admitting: *Deleted

## 2019-08-18 DIAGNOSIS — C9000 Multiple myeloma not having achieved remission: Secondary | ICD-10-CM

## 2019-08-18 MED ORDER — LENALIDOMIDE 10 MG PO CAPS: 10.0000 mg | ORAL_CAPSULE | Freq: Every day | ORAL | 0 refills | Status: DC

## 2019-08-18 NOTE — Telephone Encounter (Signed)
Refilled Revlimid per Dr. Irene Limbo OV note 07/12/2019 Refill sent to [facility], [place] Auth# [id number], 08/18/2019

## 2019-08-19 NOTE — Progress Notes (Signed)
Carelink Summary Report / Loop Recorder 

## 2019-09-06 ENCOUNTER — Ambulatory Visit (INDEPENDENT_AMBULATORY_CARE_PROVIDER_SITE_OTHER): Payer: Medicare Other | Admitting: *Deleted

## 2019-09-06 ENCOUNTER — Inpatient Hospital Stay: Payer: Medicare Other | Attending: Hematology

## 2019-09-06 ENCOUNTER — Other Ambulatory Visit: Payer: Medicare Other

## 2019-09-06 ENCOUNTER — Other Ambulatory Visit: Payer: Self-pay

## 2019-09-06 DIAGNOSIS — I639 Cerebral infarction, unspecified: Secondary | ICD-10-CM | POA: Diagnosis not present

## 2019-09-06 DIAGNOSIS — C9000 Multiple myeloma not having achieved remission: Secondary | ICD-10-CM | POA: Insufficient documentation

## 2019-09-06 LAB — CBC WITH DIFFERENTIAL/PLATELET
Abs Immature Granulocytes: 0.01 10*3/uL (ref 0.00–0.07)
Basophils Absolute: 0.1 10*3/uL (ref 0.0–0.1)
Basophils Relative: 2 %
Eosinophils Absolute: 0.1 10*3/uL (ref 0.0–0.5)
Eosinophils Relative: 7 %
HCT: 32.2 % — ABNORMAL LOW (ref 39.0–52.0)
Hemoglobin: 10.3 g/dL — ABNORMAL LOW (ref 13.0–17.0)
Immature Granulocytes: 1 %
Lymphocytes Relative: 33 %
Lymphs Abs: 0.7 10*3/uL (ref 0.7–4.0)
MCH: 31.3 pg (ref 26.0–34.0)
MCHC: 32 g/dL (ref 30.0–36.0)
MCV: 97.9 fL (ref 80.0–100.0)
Monocytes Absolute: 0.3 10*3/uL (ref 0.1–1.0)
Monocytes Relative: 12 %
Neutro Abs: 1 10*3/uL — ABNORMAL LOW (ref 1.7–7.7)
Neutrophils Relative %: 45 %
Platelets: 122 10*3/uL — ABNORMAL LOW (ref 150–400)
RBC: 3.29 MIL/uL — ABNORMAL LOW (ref 4.22–5.81)
RDW: 14.6 % (ref 11.5–15.5)
WBC: 2.2 10*3/uL — ABNORMAL LOW (ref 4.0–10.5)
nRBC: 0 % (ref 0.0–0.2)

## 2019-09-06 LAB — CMP (CANCER CENTER ONLY)
ALT: 12 U/L (ref 0–44)
AST: 21 U/L (ref 15–41)
Albumin: 3.9 g/dL (ref 3.5–5.0)
Alkaline Phosphatase: 52 U/L (ref 38–126)
Anion gap: 5 (ref 5–15)
BUN: 27 mg/dL — ABNORMAL HIGH (ref 8–23)
CO2: 29 mmol/L (ref 22–32)
Calcium: 8.6 mg/dL — ABNORMAL LOW (ref 8.9–10.3)
Chloride: 107 mmol/L (ref 98–111)
Creatinine: 1.28 mg/dL — ABNORMAL HIGH (ref 0.61–1.24)
GFR, Est AFR Am: >60 mL/min (ref >60)
GFR, Estimated: 53 mL/min — ABNORMAL LOW (ref >60)
Glucose, Bld: 124 mg/dL — ABNORMAL HIGH (ref 70–99)
Potassium: 4.8 mmol/L (ref 3.5–5.1)
Sodium: 141 mmol/L (ref 135–145)
Total Bilirubin: 0.6 mg/dL (ref 0.3–1.2)
Total Protein: 6.4 g/dL — ABNORMAL LOW (ref 6.5–8.1)

## 2019-09-07 DIAGNOSIS — D485 Neoplasm of uncertain behavior of skin: Secondary | ICD-10-CM | POA: Diagnosis not present

## 2019-09-07 DIAGNOSIS — L570 Actinic keratosis: Secondary | ICD-10-CM | POA: Diagnosis not present

## 2019-09-07 LAB — MULTIPLE MYELOMA PANEL, SERUM
Albumin SerPl Elph-Mcnc: 3.7 g/dL (ref 2.9–4.4)
Albumin/Glob SerPl: 1.8 — ABNORMAL HIGH (ref 0.7–1.7)
Alpha 1: 0.2 g/dL (ref 0.0–0.4)
Alpha2 Glob SerPl Elph-Mcnc: 0.6 g/dL (ref 0.4–1.0)
B-Globulin SerPl Elph-Mcnc: 0.7 g/dL (ref 0.7–1.3)
Gamma Glob SerPl Elph-Mcnc: 0.5 g/dL (ref 0.4–1.8)
Globulin, Total: 2.1 g/dL — ABNORMAL LOW (ref 2.2–3.9)
IgA: 105 mg/dL (ref 61–437)
IgG (Immunoglobin G), Serum: 771 mg/dL (ref 603–1613)
IgM (Immunoglobulin M), Srm: 21 mg/dL (ref 15–143)
Total Protein ELP: 5.8 g/dL — ABNORMAL LOW (ref 6.0–8.5)

## 2019-09-09 ENCOUNTER — Telehealth: Payer: Self-pay | Admitting: Hematology

## 2019-09-09 NOTE — Telephone Encounter (Signed)
Called patient regarding upcoming Webex appointment, test run complete and e-mail has been sent. °

## 2019-09-10 ENCOUNTER — Telehealth: Payer: Self-pay | Admitting: Hematology

## 2019-09-10 NOTE — Telephone Encounter (Signed)
Contacted patient to verify webex visit for pre reg °

## 2019-09-12 NOTE — Progress Notes (Signed)
HEMATOLOGY/ONCOLOGY CLINIC NOTE  Date of Service: 09/12/2019   Patient Care Team: Mayra Neer, MD as PCP - General (Family Medicine) Debara Pickett Nadean Corwin, MD as PCP - Cardiology (Cardiology)   CHIEF COMPLAINTS/:  F/u for continued mx of Myeloma   HISTORY OF PRESENTING ILLNESS:  Justin [name] is a wonderful 78 y.o. 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  Patient has a history of hypertension, diabetes, GERD, gout, squamous cell skin cancers, elevated PSA, SVT status post ablation in May 2016, chronic back pain who has a history of recurrent CVA/TIA's. He apparently had a left hemispheric TIA June 2017 and has had recurrent) subcortical infarcts thought to be related to small vessel disease. He was noted to have a small PFO that was of questionable significance. He was initially on aspirin and then continued and aspirin + plavix for secondary stroke prevention. He follows with Dr. Antony Contras for his neurology cares.  An SPEP was done due to elevated total proteins of 8.9 and showed an M spike of 2 g/dL. He was also noted to have an elevated total protein of 8.6 in April 2017. He was referred to [place] for further evaluation of his M spike. Blood test did not reveal any hypercalcemia, no significant renal failure. He has been noted to have developed anemia since June the serum and his hemoglobin was 12.6 and is now down to the mid 10 range. He notes no focal bone pains at this time. Overt acute weight loss. No fevers no chills no night sweats.   CURRENT THERAPY:   Revlimid maintenance 17m po 3 weeks on 1 week off.   INTERVAL HISTORY   I connected with  [name] 09/12/19 by a video enabled telemedicine application and verified that I am speaking with the correct person using two identifiers.   I discussed the limitations of evaluation and management by telemedicine. The patient expressed understanding and  agreed to proceed.  Other persons participating in the visit and their role in the encounter:    -[name] Medical Scribe  Patient's location: [facility]. The patient's last visit with [name] on 07/12/2019. The pt reports that he is doing well overall.  The pt reports that he is feeling well in general. He states that his BP today is 125/86 and his temperature is 96.4F which he has been testing a home. Pt says that he has not recently been using dairy products, except for ice cream. He also reports moderate, intermittent diarrhea. He has been eating twice a day. His breakfasts often consist of fruits and oatmeal, his dinners usually consist of meat, potatoes and vegetables. Pt reports that he is still experiencing near constant BLE tingling/numbness although he has been taking his Gabapentin as prescribed. Pt does have established low back pain and says that he did get a shot for his back pain and it did not affect the tingling/numbness in his legs. Pt reports that his lower back pain is beginning to affect his mobility. Pt has continued to take his Vitamin B12 supplements.  Lab results (09/06/19) of CBC w/diff and CMP is as follows: all values are WNL except for WBC at 2.2K, RBC at 3.29, Hgb at 10.3, HCT at 32.2, PLTs at 122K, Neutro Abs at 1.0K, Glucose at 124, BUN at 27, Creatinine  at 1.28, Calcium at 8.6, Total Protein at 6.4 GFR Est Non Af Am at 53. 09/06/2019 MMP is as follows: IgG at 771, IgA at 105, IgM at 21, Total Protein ELP at 5.8, Albumin at 3.7, Alpha 1 at 0.2, Alpha2 at 0.6, B-Globulin at 0.7, Gamma Glob at 0.5, M Protein is "NOT OBSERVED", Total Globulin at 2.1, Albumin/Glob at 1.8, IFE 1 shows "Immunofixation shows IgG monoclonal protein with lambda light chain specificity. Monoclonal bands detected are faint. Suggest retesting in 4-6 months. "  On review of  systems, pt reports BLE tingling/numbness, back pain, intermittent diarrhea and denies new bone pains and any other symptoms.    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 s/p RFCA on 05/01/15   Stroke ([place]) 2017   TIA and mild stroke; denies stroke symptoms since then     SURGICAL HISTORY: Past Surgical History:  Procedure Laterality Date   ATRIAL FIBRILLATION ABLATION     Dr. Lovena Le   BASAL CELL CARCINOMA EXCISION     off of back   ELECTROPHYSIOLOGIC STUDY N/A 05/01/2015   Procedure: SVT Ablation;  Surgeon: Evans Lance, MD;  Location: [facility];  Service: Cardiovascular;  Laterality: N/A;   EP IMPLANTABLE DEVICE N/A 06/04/2016   Procedure: Loop Recorder Insertion;  Surgeon: Thompson Grayer, MD;  Location: [facility];  Service: Cardiovascular;  Laterality: N/A;   INGUINAL HERNIA REPAIR Bilateral 06/16/2017   Procedure: LAPAROSCOPIC BILATERAL INGUINAL HERNIA REPAIR;  Surgeon: Clovis Riley, MD;  Location: [facility];  Service: General;  Laterality: Bilateral;   INSERTION OF MESH Bilateral 06/16/2017   Procedure: INSERTION OF MESH;  Surgeon: Clovis Riley, MD;  Location: [facility];  Service: General;  Laterality: Bilateral;   MASS EXCISION Right 07/28/2018   Procedure: EXCISION OF RIGHT FOREARM MASS;  Surgeon: Clovis Riley, MD;  Location: [facility];  Service: General;  Laterality: Right;   SQUAMOUS CELL CARCINOMA EXCISION     TEE WITHOUT CARDIOVERSION N/A 06/04/2016   Procedure: TRANSESOPHAGEAL ECHOCARDIOGRAM (TEE);  Surgeon: Pixie Casino, MD;  Location: [facility];  Service:  Cardiovascular;  Laterality: N/A;    SOCIAL HISTORY: Social History   Socioeconomic History   Marital status: Married    Spouse name: Not on file   Number of children: Not on file   Years of education: Not on file   Highest education level: Not on file  Occupational History   Not on file  Social Needs   Financial resource strain: Not on file   Food insecurity    Worry: Not on file    Inability: Not on file   Transportation needs    Medical: Not on file    Non-medical: Not on file  Tobacco Use   Smoking status: Never Smoker   Smokeless tobacco: Never Used  Substance and Sexual Activity   Alcohol use: No   Drug use: No   Sexual activity: Not on file  Lifestyle   Physical activity    Days per week: Not on file  Minutes per session: Not on file   Stress: Not on file  Relationships   Social connections    Talks on phone: Not on file    Gets together: Not on file    Attends religious service: Not on file    Active member of club or organization: Not on file    Attends meetings of clubs or organizations: Not on file    Relationship status: Not on file   Intimate partner violence    Fear of current or ex partner: Not on file    Emotionally abused: Not on file    Physically abused: Not on file    Forced sexual activity: Not on file  Other Topics Concern   Not on file  Social History Narrative   Not on file   Melburn Treiber is retired. His daughter is Mudlogger of nursing for a nursing home in [place], [place].    FAMILY HISTORY: Family History  Problem Relation Age of Onset   Stroke Mother    Hypertension Mother    Alzheimer's disease Father    Heart failure Father    Down syndrome Daughter     ALLERGIES:  is allergic to no known allergies.  MEDICATIONS:  Current Outpatient Medications  Medication Sig Dispense Refill   atorvastatin (LIPITOR) 40 MG tablet TAKE 1 TABLET EVERY MORNING (Patient taking differently: Take 40 mg by mouth daily. )  90 tablet 4   cyanocobalamin 1000 MCG tablet Take 1,000 mcg by mouth daily.     ergocalciferol (VITAMIN D2) 1.25 MG (50000 UT) capsule Take 1 capsule (50,000 Units total) by mouth once a week. 12 capsule 3   fluorouracil (EFUDEX) 5 % cream Apply 1 application topically daily as needed (for skin cancer prevention).   0   gabapentin (NEURONTIN) 300 MG capsule Take 300 mg by mouth 3 (three) times daily.      glucose monitoring kit (FREESTYLE) monitoring kit 1 each by Does not apply route as needed for other.     lenalidomide (REVLIMID) 10 MG capsule Take 1 capsule (10 mg total) by mouth daily. Take for 21 days on, 7 days off, repeat every 28 days 21 capsule 0   metFORMIN (GLUCOPHAGE) 1000 MG tablet Take 1,000 mg by mouth daily with breakfast.     nitroGLYCERIN (NITROSTAT) 0.4 MG SL tablet Place 1 tablet (0.4 mg total) under the tongue every 5 (five) minutes as needed for chest pain (Max 3 doses in 15 mins.). 30 tablet 0   omeprazole (PRILOSEC) 20 MG capsule Take 20 mg by mouth every morning.      telmisartan-hydrochlorothiazide (MICARDIS HCT) 80-12.5 MG tablet Take 1 tablet by mouth daily. 90 tablet 3   XARELTO 20 MG TABS tablet TAKE 1 TABLET DAILY WITH SUPPER 90 tablet 1   No current facility-administered medications for this visit.     REVIEW OF SYSTEMS:    A 10+ POINT REVIEW OF SYSTEMS WAS OBTAINED including neurology, dermatology, psychiatry, cardiac, respiratory, lymph, extremities, GI, GU, Musculoskeletal, constitutional, breasts, reproductive, HEENT.  All pertinent positives are noted in the HPI.  All others are negative.   PHYSICAL EXAMINATION:   ECOG PERFORMANCE STATUS: 2 - Symptomatic, <50% confined to bed There were no vitals filed for this visit.  Wt Readings from Last 3 Encounters:  07/12/19 172 lb 8 oz (78.2 kg)  03/22/19 174 lb (78.9 kg)  03/10/19 178 lb 6.4 oz (80.9 kg)   There is no height or weight on file to calculate BMI.  Teleheath visit  LABORATORY  DATA:  I have reviewed the data as listed. CBC Latest Ref Rng & Units 09/06/2019 07/05/2019 06/11/2019  WBC 4.0 - 10.5 K/uL 2.2(L) 3.4(L) 2.1(L)  Hemoglobin 13.0 - 17.0 g/dL 10.3(L) 10.3(L) 10.6(L)  Hematocrit 39.0 - 52.0 % 32.2(L) 31.5(L) 32.7(L)  Platelets 150 - 400 K/uL 122(L) 118(L) 116(L)  ANC 1k  CMP Latest Ref Rng & Units 09/06/2019 07/05/2019 05/05/2019  Glucose 70 - 99 mg/dL 124(H) 117(H) 120(H)  BUN 8 - 23 mg/dL 27(H) 37(H) 27(H)  Creatinine 0.61 - 1.24 mg/dL 1.28(H) 1.28(H) 1.23  Sodium 135 - 145 mmol/L 141 134(L) 138  Potassium 3.5 - 5.1 mmol/L 4.8 4.4 4.0  Chloride 98 - 111 mmol/L 107 101 106  CO2 22 - 32 mmol/L 29 23 24   Calcium 8.9 - 10.3 mg/dL 8.6(L) 8.5(L) 8.0(L)  Total Protein 6.5 - 8.1 g/dL 6.4(L) 6.5 6.3(L)  Total Bilirubin 0.3 - 1.2 mg/dL 0.6 0.7 0.6  Alkaline Phos 38 - 126 U/L 52 47 84  AST 15 - 41 U/L 21 21 19   ALT 0 - 44 U/L 12 14 11     Lab Results  Component Value Date   IRON 45 06/02/2017   TIBC 233 06/02/2017   IRONPCTSAT 19 (L) 06/02/2017   (Iron and TIBC)  Lab Results  Component Value Date   FERRITIN 308 08/26/2017         RADIOGRAPHIC STUDIES: I have personally reviewed the radiological images as listed and agreed with the findings in the report.       Bone Marrow Biopsy 12/24/2016 (Accession [id number])  Diagnosis Bone Marrow, Aspirate,Biopsy, and Clot, left iliac crest - HYPERCELLULAR BONE MARROW FOR AGE WITH PLASMA CELL NEOPLASM. - SEE COMMENT. PERIPHERAL BLOOD: - NORMOCYTIC-NORMOCHROMIC ANEMIA. - LEUKOPENIA.  DG Bone Survey Met (Accession [id number]) (Order [id number])  Imaging  Date: 11/28/2016 Department: [facility]-RAD[facility]OLOGY[facility]ST[facility] Released By: Pricilla Riffle Authorizing: Brunetta Genera, MD  Exam Information   Status Exam Begun  Exam Ended   Final [99] 11/28/2016 11:59 AM 11/28/2016 12:36 PM  PACS Images   Show images for DG Bone Survey Met  Study Result   CLINICAL DATA:  Monoclonal  gammopathy of unknown significance. History of squamous cell carcinoma excision, basal cell carcinoma excision.  EXAM: METASTATIC BONE SURVEY  COMPARISON:  CT neck, chest, abdomen and pelvis from 10/29/16, MRI of the head from 06/01/2016, CXR 10/27/2016, lumbar spine radiographs 06/24/2016  FINDINGS: Lateral skull: Small occipital lucency which may represent a normal arachnoid granulation posteriorly in the occiput. No definite lytic abnormality.  Cervical spine AP and lateral: Disc space narrowing at C2-3, and from C4 through C7. C4-5, C5-6 and C6-7 uncovertebral joint spurring bilaterally. No lytic abnormality.  Thoracic spine AP and lateral: T8-9 right-sided osteophytes and to a lesser degree T9-10. No lytic abnormality. Slight multilevel lumbar thoracic disc space narrowing likely degenerative.  Lumbar spine AP and lateral: Disc space narrowing at L4-5 and to a greater extent L5-S1. No lytic disease. L3 through S1 facet sclerosis.  AP pelvis: Negative for lytic disease.  Bilateral upper and lower extremities shoulders through wrist and from the hips through ankle: Negative for lytic disease. Joint space narrowing of the femorotibial compartments both knees. Subchondral cyst of the right patella.  CXR: Clear lungs. Cardiac implantable monitoring device projects over the left heart. Aortic atherosclerosis. No lytic disease.  IMPRESSION: No findings suspicious for lytic disease.   Electronically Signed   By: Ashley Royalty M.D.   On: 11/28/2016  14:58     ASSESSMENT & PLAN:   78 y.o. 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 (12/2016) Peak M spike 2.6  Cytogenetics and Myeloma FISH panel.- trisomy 11 Patient has normocytic anemia without any other clear etiology. (>2g/dl lower that lower limit of normal which is 13) which per criteria would place this in the Multiple myeloma  category as opposed to Smoldering Multiple myeloma (Borderline criterion)  No overt renal failure or hypercalcemia at this time No focal bone pains though he has significant chronic back pain related to degenerative disc disease. Bone survey shows no overt lytic lesions.  10/16/18 CT Coronary Morph revealed Coronary artery calcium score 299 Agatston units, this places the patient in the 47th percentile for age and gender, suggesting intermediate risk for future cardiac events. 2. Nonobstructive coronary disease.  12/08/18 Bone Density study revealed that the pt has osteoporosis   07/05/2019 shows an M protein of 0.1.  2) Anemia and thrombocytopenia -- related to treatment (Revlimid) -stable  3) Grade 2 Neuropathy -- in b/l feet and halfway up lower leg without pain -[employer] to [employer] -improved of [place]  5) Right kidney cyst with nodularity -Reviewed the 01/05/19 US Abdomen which revealed Septated cyst arising from lower pole right kidney with a questionable solid focus within this lesion. This nodular area potentially could represent a small neoplasm developing within this complex cyst. Given this circumstance, pre and post contrast MR of the kidneys is advised to further evaluate. If there is a contraindication to MR, pre and post-contrast CT would be a reasonable alternative to further assess. Study otherwise unremarkable. -Pt will likely need urology referral and MRI, pt would like to discuss this further with his PCP before triggering a referral at this point  6) Borderline low B12 levels, previously 299 --- on replacement -  -05/05/19 Vitamin B12 at 586 -continue replacement.   7) recurrent CVA - thought to be related to small vessel disease as per neurology. Has a small PFO which could be an additional risk factor as well as his afib  8)  P afib Plan -Continue on Xarelto per cardiology.  9) H/o recurrent SCC -Underwent surgical excision on right forearm with Dr. Romana Juniper on 07/28/18. His surgical pathology showed evidence of Squamous Cell Carcinoma. Plan -no evidence of recurrence locally at the site of surgery at this time. -continue dermatology followup  10) Lower extremity discomfort - improved, stable. Likely Discogenic pain -[place] on 12/2017 revealed no blood clot -Pt has established care with orthopedist and PT.  11) Patient Active Problem List   Diagnosis Date Noted   Osteoporosis 03/10/2019   Multiple myeloma without remission ([place]) 03/10/2019   Chest pain 09/27/2018   Multiple myeloma ([place]) 09/27/2018   History of loop recorder 06/12/2017   Cryptogenic stroke ([place]) 10/28/2016   Gait abnormality    History of CVA (cerebrovascular accident)    History of TIA (transient ischemic attack)    Benign essential HTN    Paroxysmal SVT (supraventricular tachycardia) (HCC)    Acute blood loss anemia    Acute ischemic stroke ([place])    CVA (cerebral vascular accident) ([place]) 10/26/2016   History of recent stroke 06/06/2016   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   TIA (transient ischemic attack) 06/02/2016   Numbness 06/01/2016   Right arm numbness 06/01/2016   Atherosclerosis of native coronary artery of native heart without angina pectoris  03/20/2016   Hypertension    GERD (gastroesophageal reflux disease)    Gout    S/P RF ablation operation for arrhythmia 12/13/2014   Hyperlipidemia 12/13/2014   Controlled type 2 diabetes mellitus with complication, without long-term current use of insulin ([place]) 12/07/2014    PLAN: -Discussed pt labwork, 09/06/19; all values are WNL except for WBC at 2.2K, RBC at 3.29, Hgb at 10.3, HCT at 32.2, PLTs at 122K, Neutro Abs at 1.0K, Glucose at 124, BUN at 27, Creatinine at 1.28, Calcium at 8.6, Total Protein at 6.4 GFR Est Non Af Am at 53. Blood chemistries look very stable -Discussed 09/06/2019 MMP is as follows: IgG at 771, IgA at 105, IgM at 21, Total Protein ELP at 5.8, Albumin at  3.7, Alpha 1 at 0.2, Alpha2 at 0.6, B-Globulin at 0.7, Gamma Glob at 0.5, M Protein is "NOT OBSERVED", Total Globulin at 2.1, Albumin/Glob at 1.8, IFE 1 shows "Immunofixation shows IgG monoclonal protein with lambda light chain specificity. Monoclonal bands detected are faint. Suggest retesting in 4-6 months. " -Pt is in clinical remission of Mulitple Myeloma at this time -Advised pt to take PO Vitamin D 2x a week. -Will put pt on Revlimid 35m beginning next cycle; 3 weeks on, 1 week off  -The pt has no prohibitive toxicities from continuing Revlimid at this time -Advised pt to speak with PCP about potentially switching to Cymbalta from neurontin for his radiculopathy/neuropathy -Plan Prolia shot  As scheduled  q6 months -Will see back in 2 months with labs    FOLLOW UP: RTC in 2 months with labs  Prolia shot in 1 month Revlimid dosage changed from 10 mg to 5 mg beginning next cycle  The total time spent in the appt was 30 minutes and more than 50% was on counseling and direct patient cares.  All of the patient's questions were answered with apparent satisfaction. The patient knows to call the clinic with any problems, questions or concerns.   GSullivan LoneMD MDepauvilleAAHIVMS SBaylor Surgicare At Granbury LLCCAmbulatory Care CenterHematology/Oncology Physician CSw[facility] (Office):       [phone](Work cell):  [phone](Fax):           [phone] I, [name] am acting as a scribe for Dr. GSullivan Lone   .I have reviewed the above documentation for accuracy and completeness, and I agree with the above. .Brunetta GeneraMD

## 2019-09-13 ENCOUNTER — Ambulatory Visit (INDEPENDENT_AMBULATORY_CARE_PROVIDER_SITE_OTHER): Payer: Medicare Other | Admitting: *Deleted

## 2019-09-13 ENCOUNTER — Inpatient Hospital Stay (HOSPITAL_BASED_OUTPATIENT_CLINIC_OR_DEPARTMENT_OTHER): Payer: Medicare Other | Admitting: Hematology

## 2019-09-13 DIAGNOSIS — M810 Age-related osteoporosis without current pathological fracture: Secondary | ICD-10-CM

## 2019-09-13 DIAGNOSIS — C9000 Multiple myeloma not having achieved remission: Secondary | ICD-10-CM

## 2019-09-13 DIAGNOSIS — I639 Cerebral infarction, unspecified: Secondary | ICD-10-CM | POA: Diagnosis not present

## 2019-09-13 LAB — CUP PACEART REMOTE DEVICE CHECK
Date Time Interrogation Session: 20200921021009
Date Time Interrogation Session: 20200921021009
Implantable Pulse Generator Implant Date: 20170613
Implantable Pulse Generator Implant Date: 20170613

## 2019-09-14 ENCOUNTER — Other Ambulatory Visit: Payer: Self-pay

## 2019-09-14 ENCOUNTER — Telehealth: Payer: Self-pay | Admitting: Hematology

## 2019-09-14 NOTE — Telephone Encounter (Signed)
Scheduled appt per 9/21 los. Per MD patient did not need an injection in a month, since it was already scheduled for 9/23.  Spoke with patient and they are aware of their appt dates and time.

## 2019-09-15 ENCOUNTER — Other Ambulatory Visit: Payer: Self-pay

## 2019-09-15 ENCOUNTER — Inpatient Hospital Stay: Payer: Medicare Other

## 2019-09-15 VITALS — BP 122/62 | HR 52 | Temp 98.3°F | Resp 18

## 2019-09-15 DIAGNOSIS — C9000 Multiple myeloma not having achieved remission: Secondary | ICD-10-CM

## 2019-09-15 DIAGNOSIS — M818 Other osteoporosis without current pathological fracture: Secondary | ICD-10-CM

## 2019-09-15 MED ORDER — DENOSUMAB 60 MG/ML ~~LOC~~ SOSY
60.0000 mg | PREFILLED_SYRINGE | Freq: Once | SUBCUTANEOUS | Status: AC
  Administered 2019-09-15: 60 mg via SUBCUTANEOUS

## 2019-09-15 MED ORDER — DENOSUMAB 60 MG/ML ~~LOC~~ SOSY
PREFILLED_SYRINGE | SUBCUTANEOUS | Status: AC
  Filled 2019-09-15: qty 1

## 2019-09-15 NOTE — Progress Notes (Signed)
DR Irene Limbo is ok with using Pts Cmet from 09/06/2019 and says its ok to give with calcium of 8.6

## 2019-09-20 NOTE — Progress Notes (Signed)
Carelink Summary Report / Loop Recorder 

## 2019-09-22 ENCOUNTER — Other Ambulatory Visit: Payer: Self-pay | Admitting: *Deleted

## 2019-09-22 DIAGNOSIS — C9000 Multiple myeloma not having achieved remission: Secondary | ICD-10-CM

## 2019-09-22 MED ORDER — LENALIDOMIDE 5 MG PO CAPS: 5.0000 mg | ORAL_CAPSULE | Freq: Every day | ORAL | 0 refills | Status: DC

## 2019-09-22 NOTE — Telephone Encounter (Signed)
Per Dr.Kale OV note 09/13/2019, Revlimid dose changes on this refill to 5 mg with same instructions. Prescription for new dose Revlimid escribed to [facility], [place] # [id number], 09/22/2019

## 2019-09-25 DIAGNOSIS — Z23 Encounter for immunization: Secondary | ICD-10-CM | POA: Diagnosis not present

## 2019-10-01 DIAGNOSIS — M792 Neuralgia and neuritis, unspecified: Secondary | ICD-10-CM | POA: Diagnosis not present

## 2019-10-13 DIAGNOSIS — Z23 Encounter for immunization: Secondary | ICD-10-CM | POA: Diagnosis not present

## 2019-10-13 DIAGNOSIS — I10 Essential (primary) hypertension: Secondary | ICD-10-CM | POA: Diagnosis not present

## 2019-10-13 DIAGNOSIS — K219 Gastro-esophageal reflux disease without esophagitis: Secondary | ICD-10-CM | POA: Diagnosis not present

## 2019-10-13 DIAGNOSIS — Z0000 Encounter for general adult medical examination without abnormal findings: Secondary | ICD-10-CM | POA: Diagnosis not present

## 2019-10-13 DIAGNOSIS — I4891 Unspecified atrial fibrillation: Secondary | ICD-10-CM | POA: Diagnosis not present

## 2019-10-13 DIAGNOSIS — C9000 Multiple myeloma not having achieved remission: Secondary | ICD-10-CM | POA: Diagnosis not present

## 2019-10-13 DIAGNOSIS — E1169 Type 2 diabetes mellitus with other specified complication: Secondary | ICD-10-CM | POA: Diagnosis not present

## 2019-10-13 DIAGNOSIS — G629 Polyneuropathy, unspecified: Secondary | ICD-10-CM | POA: Diagnosis not present

## 2019-10-13 DIAGNOSIS — D509 Iron deficiency anemia, unspecified: Secondary | ICD-10-CM | POA: Diagnosis not present

## 2019-10-13 DIAGNOSIS — I679 Cerebrovascular disease, unspecified: Secondary | ICD-10-CM | POA: Diagnosis not present

## 2019-10-13 DIAGNOSIS — D61818 Other pancytopenia: Secondary | ICD-10-CM | POA: Diagnosis not present

## 2019-10-13 DIAGNOSIS — I2510 Atherosclerotic heart disease of native coronary artery without angina pectoris: Secondary | ICD-10-CM | POA: Diagnosis not present

## 2019-10-15 ENCOUNTER — Ambulatory Visit (INDEPENDENT_AMBULATORY_CARE_PROVIDER_SITE_OTHER): Payer: Medicare Other | Admitting: *Deleted

## 2019-10-15 DIAGNOSIS — I63412 Cerebral infarction due to embolism of left middle cerebral artery: Secondary | ICD-10-CM | POA: Diagnosis not present

## 2019-10-16 LAB — CUP PACEART REMOTE DEVICE CHECK
Date Time Interrogation Session: 20201024021045
Implantable Pulse Generator Implant Date: 20170613

## 2019-10-20 ENCOUNTER — Encounter: Payer: Self-pay | Admitting: Podiatry

## 2019-10-20 ENCOUNTER — Other Ambulatory Visit: Payer: Self-pay

## 2019-10-20 ENCOUNTER — Ambulatory Visit (INDEPENDENT_AMBULATORY_CARE_PROVIDER_SITE_OTHER): Payer: Medicare Other | Admitting: Podiatry

## 2019-10-20 DIAGNOSIS — H2513 Age-related nuclear cataract, bilateral: Secondary | ICD-10-CM | POA: Diagnosis not present

## 2019-10-20 DIAGNOSIS — M79674 Pain in right toe(s): Secondary | ICD-10-CM

## 2019-10-20 DIAGNOSIS — L600 Ingrowing nail: Secondary | ICD-10-CM

## 2019-10-20 DIAGNOSIS — H04123 Dry eye syndrome of bilateral lacrimal glands: Secondary | ICD-10-CM | POA: Diagnosis not present

## 2019-10-20 DIAGNOSIS — E119 Type 2 diabetes mellitus without complications: Secondary | ICD-10-CM | POA: Diagnosis not present

## 2019-10-20 DIAGNOSIS — M2061 Acquired deformities of toe(s), unspecified, right foot: Secondary | ICD-10-CM

## 2019-10-20 DIAGNOSIS — M205X1 Other deformities of toe(s) (acquired), right foot: Secondary | ICD-10-CM | POA: Diagnosis not present

## 2019-10-20 DIAGNOSIS — H524 Presbyopia: Secondary | ICD-10-CM | POA: Diagnosis not present

## 2019-10-20 NOTE — Patient Instructions (Signed)

## 2019-10-22 ENCOUNTER — Other Ambulatory Visit: Payer: Self-pay | Admitting: Hematology

## 2019-10-22 ENCOUNTER — Encounter: Payer: Self-pay | Admitting: Podiatry

## 2019-10-22 NOTE — Telephone Encounter (Signed)
Revlimid refilled per Dr.Kale OV note 09/13/2019 Refill escribed to [facility], [place] Auth# [id number], 10/22/2019

## 2019-10-22 NOTE — Progress Notes (Signed)
Carelink Summary Report / Loop Recorder 

## 2019-10-22 NOTE — Progress Notes (Signed)
Subjective:  Patient ID: Justin [name], male    DOB: 1941/03/14,  MRN: [id number]  Chief Complaint  Patient presents with  . Nail Problem    i have an ingrown on the right big toenail and is sore and tender    79 y.o. male presents with the above complaint.  Patient states the right lateral big toenail has been painful in nature.  Patient has been cutting into the nail to try to drain it.  Patient is a prediabetic with A1c of 6.3 it is tender to touch no throbbing or tingling parts of stepped on and while ambulating.  No other acute complaints has not been getting infected.   Review of Systems: Negative except as noted in the HPI. Denies N/V/F/Ch.  Past Medical History:  Diagnosis Date  . Anemia   . Arthritis   . 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 Diabetes mellitus without complication ([place])   . GERD (gastroesophageal reflux disease)   . Gout   . History of loop recorder   . Hypertension   . Left leg weakness   . Multiple myeloma ([place]) 09/27/2018  . Paroxysmal SVT (supraventricular tachycardia) ([place])    a. s/p RFCA on 05/01/15  . Stroke [facility]) 2017   TIA and mild stroke; denies stroke symptoms since then     Current Outpatient Medications:  .  Pregabalin (LYRICA PO), Take by mouth., Disp: , Rfl:  .  atorvastatin (LIPITOR) 40 MG tablet, TAKE 1 TABLET EVERY MORNING (Patient taking differently: Take 40 mg by mouth daily. ), Disp: 90 tablet, Rfl: 4 .  cyanocobalamin 1000 MCG tablet, Take 1,000 mcg by mouth daily., Disp: , Rfl:  .  ergocalciferol (VITAMIN D2) 1.25 MG (50000 UT) capsule, Take 1 capsule (50,000 Units total) by mouth once a week., Disp: 12 capsule, Rfl: 3 .  fluorouracil (EFUDEX) 5 % cream, Apply 1 application topically daily as needed (for skin cancer prevention). , Disp: , Rfl: 0 .  glucose monitoring kit (FREESTYLE) monitoring kit, 1 each by Does not apply route as needed for other., Disp: , Rfl:  .  lenalidomide (REVLIMID) 5 MG  capsule, Take 1 capsule (5 mg total) by mouth daily. Take for 21 days on, 7 days off, repeat every 28 days, Disp: 21 capsule, Rfl: 0 .  metFORMIN (GLUCOPHAGE) 1000 MG tablet, Take 1,000 mg by mouth daily with breakfast., Disp: , Rfl:  .  nitroGLYCERIN (NITROSTAT) 0.4 MG SL tablet, Place 1 tablet (0.4 mg total) under the tongue every 5 (five) minutes as needed for chest pain (Max 3 doses in 15 mins.)., Disp: 30 tablet, Rfl: 0 .  omeprazole (PRILOSEC) 20 MG capsule, Take 20 mg by mouth every morning. , Disp: , Rfl:  .  telmisartan-hydrochlorothiazide (MICARDIS HCT) 80-12.5 MG tablet, Take 1 tablet by mouth daily., Disp: 90 tablet, Rfl: 3 .  XARELTO 20 MG TABS tablet, TAKE 1 TABLET DAILY WITH SUPPER, Disp: 90 tablet, Rfl: 1  Social History   Tobacco Use  Smoking Status Never Smoker  Smokeless Tobacco Never Used    Allergies  Allergen Reactions  . No Known Allergies    Objective:  There were no vitals filed for this visit. There is no height or weight on file to calculate BMI. Constitutional Well developed. Well nourished.  Vascular Dorsalis pedis pulses palpable bilaterally. Posterior tibial pulses palpable bilaterally. Capillary refill normal to all digits.  No cyanosis or clubbing noted. Pedal hair growth normal.  Neurologic Normal speech. Oriented to person, place, and time. Epicritic sensation to light touch grossly present bilaterally.  Dermatologic Painful ingrowing nail at lateral nail borders of the hallux nail right. No other open wounds. No skin lesions.  Orthopedic: Normal joint ROM without pain or crepitus bilaterally. No visible deformities. No bony tenderness.   Radiographs: None Assessment:  No diagnosis found. Plan:  Patient was evaluated and treated and all questions answered.  Ingrown Nail, right -Patient elects to proceed with minor surgery to remove ingrown toenail removal today. Consent reviewed and signed by patient. -Ingrown nail excised. See  procedure note. -Educated on post-procedure care including soaking. Written instructions provided and reviewed. -Patient to follow up in 2 weeks for nail check.  Toe deformities with contractures -I explained to the patient that he will benefit from diabetic shoes.  When patient follows up for his second week postop visit I will discuss further about getting diabetic shoes with him.  Procedure: Excision of Ingrown Toenail Location: Right 1st toe lateral nail borders. Anesthesia: Lidocaine 1% plain; 1.5 mL and Marcaine 0.5% plain; 1.5 mL, digital block. Skin Prep: Betadine. Dressing: Silvadene; telfa; dry, sterile, compression dressing. Technique: Following skin prep, the toe was exsanguinated and a tourniquet was secured at the base of the toe. The affected nail border was freed, split with a nail splitter, and excised. Chemical matrixectomy was then performed with phenol and irrigated out with alcohol. The tourniquet was then removed and sterile dressing applied. Disposition: Patient tolerated procedure well. Patient to return in 2 weeks for follow-up.   Return in about 1 week (around 10/27/2019). 

## 2019-10-27 ENCOUNTER — Other Ambulatory Visit: Payer: Self-pay

## 2019-10-27 ENCOUNTER — Ambulatory Visit (INDEPENDENT_AMBULATORY_CARE_PROVIDER_SITE_OTHER): Payer: Medicare Other | Admitting: Podiatry

## 2019-10-27 DIAGNOSIS — L600 Ingrowing nail: Secondary | ICD-10-CM

## 2019-10-27 DIAGNOSIS — M79675 Pain in left toe(s): Secondary | ICD-10-CM

## 2019-10-27 DIAGNOSIS — M79674 Pain in right toe(s): Secondary | ICD-10-CM

## 2019-10-27 NOTE — Patient Instructions (Signed)

## 2019-10-30 ENCOUNTER — Encounter: Payer: Self-pay | Admitting: Podiatry

## 2019-10-30 NOTE — Progress Notes (Signed)
Subjective:  Patient ID: [name], male    DOB: 05-26-1941,  MRN: [id number]  Chief Complaint  Patient presents with  . Nail Problem    pt is here for a f/u on ingrown toenail of the left big toenail, pt is doing alot better, with minimal pain, pt also has a possible ingrown of the right big toenail lateral side    78 y.o. male presents with the above complaint.  Patient is here for a follow-up of the right ingrown toenail procedure done by me 2 weeks ago.  Patient states is doing well.  He has been doing his Epson salt soaks.  He states that this is healing well.  He denies any pain to that site.  Today he also has a secondary complaint of left ingrown toenail.  He states that this has been painful in nature.  It hurts him when he is ambulating.  He denies any infection to the area.  This pain has been going on for about a year.  He has not tried anything to alleviate the pain.  He denies taking any medication for the pain.   Review of Systems: Negative except as noted in the HPI. Denies N/V/F/Ch.  Past Medical History:  Diagnosis Date  . Anemia   . Arthritis   . 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 Diabetes mellitus without complication ([place])   . GERD (gastroesophageal reflux disease)   . Gout   . History of loop recorder   . Hypertension   . Left leg weakness   . Multiple myeloma ([place]) 09/27/2018  . Paroxysmal SVT (supraventricular tachycardia) ([place])    a. s/p RFCA on 05/01/15  . Stroke [facility]) 2017   TIA and mild stroke; denies stroke symptoms since then     Current Outpatient Medications:  .  atorvastatin (LIPITOR) 40 MG tablet, TAKE 1 TABLET EVERY MORNING (Patient taking differently: Take 40 mg by mouth daily. ), Disp: 90 tablet, Rfl: 4 .  cyanocobalamin 1000 MCG tablet, Take 1,000 mcg by mouth daily., Disp: , Rfl:  .  ergocalciferol (VITAMIN D2) 1.25 MG (50000 UT) capsule, Take 1 capsule (50,000 Units total) by mouth once a week., Disp: 12  capsule, Rfl: 3 .  fluorouracil (EFUDEX) 5 % cream, Apply 1 application topically daily as needed (for skin cancer prevention). , Disp: , Rfl: 0 .  glucose monitoring kit (FREESTYLE) monitoring kit, 1 each by Does not apply route as needed for other., Disp: , Rfl:  .  metFORMIN (GLUCOPHAGE) 1000 MG tablet, Take 1,000 mg by mouth daily with breakfast., Disp: , Rfl:  .  nitroGLYCERIN (NITROSTAT) 0.4 MG SL tablet, Place 1 tablet (0.4 mg total) under the tongue every 5 (five) minutes as needed for chest pain (Max 3 doses in 15 mins.)., Disp: 30 tablet, Rfl: 0 .  omeprazole (PRILOSEC) 20 MG capsule, Take 20 mg by mouth every morning. , Disp: , Rfl:  .  Pregabalin (LYRICA PO), Take by mouth., Disp: , Rfl:  .  REVLIMID 5 MG capsule, TAKE 1 CAPSULE DAILY FOR 21 DAYS ON, 7 DAYS OFF, REPEAT EVERY 28 DAYS, Disp: 21 capsule, Rfl: 0 .  telmisartan-hydrochlorothiazide (MICARDIS HCT) 80-12.5 MG tablet, Take 1 tablet by mouth daily., Disp: 90 tablet, Rfl: 3 .  XARELTO 20 MG TABS tablet, TAKE 1 TABLET DAILY WITH SUPPER, Disp: 90 tablet, Rfl: 1  Social History   Tobacco Use  Smoking Status Never Smoker  Smokeless Tobacco  Never Used    Allergies  Allergen Reactions  . No Known Allergies    Objective:  There were no vitals filed for this visit. There is no height or weight on file to calculate BMI. Constitutional Well developed. Well nourished.  Vascular Dorsalis pedis pulses palpable bilaterally. Posterior tibial pulses palpable bilaterally. Capillary refill normal to all digits.  No cyanosis or clubbing noted. Pedal hair growth normal.  Neurologic Normal speech. Oriented to person, place, and time. Epicritic sensation to light touch grossly present bilaterally.  Dermatologic Painful ingrowing nail at medial nail borders of the hallux nail left. No other open wounds. No skin lesions.  Orthopedic: Normal joint ROM without pain or crepitus bilaterally. No visible deformities. No bony tenderness.    Radiographs: None Assessment:   1. Ingrown right big toenail   2. Pain of right great toe   3. Ingrown left big toenail   4. Toe pain, left    Plan:  Patient was evaluated and treated and all questions answered.  Ingrown Nail, left -Patient elects to proceed with minor surgery to remove ingrown toenail removal today. Consent reviewed and signed by patient. -Ingrown nail excised. See procedure note. -Educated on post-procedure care including soaking. Written instructions provided and reviewed. -Patient to follow up in 2 weeks for nail check.  Procedure: Excision of Ingrown Toenail Location: Left 1st toe medial nail borders. Anesthesia: Lidocaine 1% plain; 1.5 mL and Marcaine 0.5% plain; 1.5 mL, digital block. Skin Prep: Betadine. Dressing: Silvadene; telfa; dry, sterile, compression dressing. Technique: Following skin prep, the toe was exsanguinated and a tourniquet was secured at the base of the toe. The affected nail border was freed, split with a nail splitter, and excised. Chemical matrixectomy was then performed with phenol and irrigated out with alcohol. The tourniquet was then removed and sterile dressing applied. Disposition: Patient tolerated procedure well. Patient to return in 2 weeks for follow-up.   No follow-ups on file.

## 2019-11-05 ENCOUNTER — Ambulatory Visit: Payer: Medicare Other | Admitting: Internal Medicine

## 2019-11-05 ENCOUNTER — Other Ambulatory Visit: Payer: Self-pay | Admitting: *Deleted

## 2019-11-05 DIAGNOSIS — C9000 Multiple myeloma not having achieved remission: Secondary | ICD-10-CM

## 2019-11-08 ENCOUNTER — Inpatient Hospital Stay: Payer: Medicare Other | Attending: Hematology

## 2019-11-08 ENCOUNTER — Other Ambulatory Visit: Payer: Self-pay

## 2019-11-08 DIAGNOSIS — C9000 Multiple myeloma not having achieved remission: Secondary | ICD-10-CM

## 2019-11-08 LAB — CBC WITH DIFFERENTIAL (CANCER CENTER ONLY)
Abs Immature Granulocytes: 0.01 10*3/uL (ref 0.00–0.07)
Basophils Absolute: 0 10*3/uL (ref 0.0–0.1)
Basophils Relative: 2 %
Eosinophils Absolute: 0.1 10*3/uL (ref 0.0–0.5)
Eosinophils Relative: 6 %
HCT: 33.1 % — ABNORMAL LOW (ref 39.0–52.0)
Hemoglobin: 10.7 g/dL — ABNORMAL LOW (ref 13.0–17.0)
Immature Granulocytes: 0 %
Lymphocytes Relative: 27 %
Lymphs Abs: 0.6 10*3/uL — ABNORMAL LOW (ref 0.7–4.0)
MCH: 31.7 pg (ref 26.0–34.0)
MCHC: 32.3 g/dL (ref 30.0–36.0)
MCV: 97.9 fL (ref 80.0–100.0)
Monocytes Absolute: 0.3 10*3/uL (ref 0.1–1.0)
Monocytes Relative: 14 %
Neutro Abs: 1.2 10*3/uL — ABNORMAL LOW (ref 1.7–7.7)
Neutrophils Relative %: 51 %
Platelet Count: 137 10*3/uL — ABNORMAL LOW (ref 150–400)
RBC: 3.38 MIL/uL — ABNORMAL LOW (ref 4.22–5.81)
RDW: 14 % (ref 11.5–15.5)
WBC Count: 2.4 10*3/uL — ABNORMAL LOW (ref 4.0–10.5)
nRBC: 0 % (ref 0.0–0.2)

## 2019-11-08 LAB — CMP (CANCER CENTER ONLY)
ALT: 21 U/L (ref 0–44)
AST: 35 U/L (ref 15–41)
Albumin: 3.8 g/dL (ref 3.5–5.0)
Alkaline Phosphatase: 60 U/L (ref 38–126)
Anion gap: 10 (ref 5–15)
BUN: 29 mg/dL — ABNORMAL HIGH (ref 8–23)
CO2: 24 mmol/L (ref 22–32)
Calcium: 8.9 mg/dL (ref 8.9–10.3)
Chloride: 109 mmol/L (ref 98–111)
Creatinine: 1.26 mg/dL — ABNORMAL HIGH (ref 0.61–1.24)
GFR, Est AFR Am: >60 mL/min (ref >60)
GFR, Estimated: 54 mL/min — ABNORMAL LOW (ref >60)
Glucose, Bld: 115 mg/dL — ABNORMAL HIGH (ref 70–99)
Potassium: 4.7 mmol/L (ref 3.5–5.1)
Sodium: 143 mmol/L (ref 135–145)
Total Bilirubin: 0.6 mg/dL (ref 0.3–1.2)
Total Protein: 6.5 g/dL (ref 6.5–8.1)

## 2019-11-09 ENCOUNTER — Other Ambulatory Visit: Payer: Self-pay | Admitting: Internal Medicine

## 2019-11-10 ENCOUNTER — Telehealth: Payer: Self-pay | Admitting: Hematology

## 2019-11-10 LAB — MULTIPLE MYELOMA PANEL, SERUM
Albumin SerPl Elph-Mcnc: 3.3 g/dL (ref 2.9–4.4)
Albumin/Glob SerPl: 1.4 (ref 0.7–1.7)
Alpha 1: 0.2 g/dL (ref 0.0–0.4)
Alpha2 Glob SerPl Elph-Mcnc: 0.6 g/dL (ref 0.4–1.0)
B-Globulin SerPl Elph-Mcnc: 0.9 g/dL (ref 0.7–1.3)
Gamma Glob SerPl Elph-Mcnc: 0.7 g/dL (ref 0.4–1.8)
Globulin, Total: 2.5 g/dL (ref 2.2–3.9)
IgA: 138 mg/dL (ref 61–437)
IgG (Immunoglobin G), Serum: 823 mg/dL (ref 603–1613)
IgM (Immunoglobulin M), Srm: 21 mg/dL (ref 15–143)
Total Protein ELP: 5.8 g/dL — ABNORMAL LOW (ref 6.0–8.5)

## 2019-11-10 NOTE — Telephone Encounter (Signed)
[facility] PAL 11/23 f/u moved to 12/1. Confirmed with patient.

## 2019-11-15 ENCOUNTER — Other Ambulatory Visit: Payer: Medicare Other

## 2019-11-15 ENCOUNTER — Ambulatory Visit: Payer: Medicare Other | Admitting: Hematology

## 2019-11-17 ENCOUNTER — Ambulatory Visit (INDEPENDENT_AMBULATORY_CARE_PROVIDER_SITE_OTHER): Payer: Medicare Other | Admitting: *Deleted

## 2019-11-17 DIAGNOSIS — D485 Neoplasm of uncertain behavior of skin: Secondary | ICD-10-CM | POA: Diagnosis not present

## 2019-11-17 DIAGNOSIS — I639 Cerebral infarction, unspecified: Secondary | ICD-10-CM | POA: Diagnosis not present

## 2019-11-17 DIAGNOSIS — D0462 Carcinoma in situ of skin of left upper limb, including shoulder: Secondary | ICD-10-CM | POA: Diagnosis not present

## 2019-11-18 LAB — CUP PACEART REMOTE DEVICE CHECK
Date Time Interrogation Session: 20201125210806
Implantable Pulse Generator Implant Date: 20170613

## 2019-11-22 NOTE — Progress Notes (Signed)
HEMATOLOGY/ONCOLOGY CLINIC NOTE  Date of Service: 11/23/2019   Patient Care Team: Mayra Neer, MD as PCP - General (Family Medicine) Debara Pickett Nadean Corwin, MD as PCP - Cardiology (Cardiology)   CHIEF COMPLAINTS/:  F/u for continued mx of Myeloma   HISTORY OF PRESENTING ILLNESS:  Justin [name] is a wonderful 78 y.o. 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  Patient has a history of hypertension, diabetes, GERD, gout, squamous cell skin cancers, elevated PSA, SVT status post ablation in May 2016, chronic back pain who has a history of recurrent CVA/TIA's. He apparently had a left hemispheric TIA June 2017 and has had recurrent) subcortical infarcts thought to be related to small vessel disease. He was noted to have a small PFO that was of questionable significance. He was initially on aspirin and then continued and aspirin + plavix for secondary stroke prevention. He follows with Dr. Antony Contras for his neurology cares.  An SPEP was done due to elevated total proteins of 8.9 and showed an M spike of 2 g/dL. He was also noted to have an elevated total protein of 8.6 in April 2017. He was referred to [place] for further evaluation of his M spike. Blood test did not reveal any hypercalcemia, no significant renal failure. He has been noted to have developed anemia since June the serum and his hemoglobin was 12.6 and is now down to the mid 10 range. He notes no focal bone pains at this time. Overt acute weight loss. No fevers no chills no night sweats.   CURRENT THERAPY:   Revlimid maintenance 80m po 3 weeks on 1 week off.   INTERVAL HISTORY   Justin [name] seen today for follow-up of his myeloma. The patient's last visit with [name] on 09/13/2019. The pt reports that he is doing well overall.  Pt has been doing good in the interim. The pt reports that he has been switched to Lyrica from Neurontin and his neuropathy has  since improved. His back pain has improved as well and his diarrhea has also resolved.   Lab results today (11/08/19) of CBC w/diff and CMP is as follows: all values are WNL except for WBC at 2.4K, RBC at 3.38, Hgb at 10.7, HCT at 33.1, PLTs at 137K, Neutro Abs at 1.2K, Lymphs Abs at 0.6K, Glucose at 115, BUN at 29, Creatinine at 1.26, GFR Est Non Af Am at 575 11/08/2019 MMP shows M protein "Not Observed"  On review of systems, pt reports improving back pain, improving neuropathy and denies diarrhea and any other symptoms.   MEDICAL HISTORY:  Past Medical History:  Diagnosis Date  . Anemia   . Arthritis   . 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 Diabetes mellitus without complication ([facility]   . GERD (gastroesophageal reflux disease)   . Gout   . History of loop recorder   . Hypertension   . Left leg weakness   . Multiple myeloma ([facility] 09/27/2018  . Paroxysmal SVT (supraventricular tachycardia) ([facility]    a. s/p RFCA on 05/01/15  . Stroke ([facility] 2017   TIA and mild stroke; denies stroke symptoms since then     SURGICAL HISTORY: Past Surgical History:  Procedure Laterality Date  . ATRIAL FIBRILLATION ABLATION     Dr. TLovena Le . BASAL CELL CARCINOMA EXCISION     off of back  .  ELECTROPHYSIOLOGIC STUDY N/A 05/01/2015   Procedure: SVT Ablation;  Surgeon: Evans Lance, MD;  Location: [place] [facility];  Service: Cardiovascular;  Laterality: N/A;  . EP IMPLANTABLE DEVICE N/A 06/04/2016   Procedure: Loop Recorder Insertion;  Surgeon: Thompson Grayer, MD;  Location: [place] [facility];  Service: Cardiovascular;  Laterality: N/A;  . INGUINAL HERNIA REPAIR Bilateral 06/16/2017   Procedure: LAPAROSCOPIC BILATERAL INGUINAL HERNIA REPAIR;  Surgeon: Clovis Riley, MD;  Location: [place];  Service: General;  Laterality: Bilateral;  . INSERTION OF MESH Bilateral 06/16/2017   Procedure: INSERTION OF MESH;  Surgeon: Clovis Riley, MD;  Location: [place];  Service:  General;  Laterality: Bilateral;  . MASS EXCISION Right 07/28/2018   Procedure: EXCISION OF RIGHT FOREARM MASS;  Surgeon: Clovis Riley, MD;  Location: [facility];  Service: General;  Laterality: Right;  . SQUAMOUS CELL CARCINOMA EXCISION    . TEE WITHOUT CARDIOVERSION N/A 06/04/2016   Procedure: TRANSESOPHAGEAL ECHOCARDIOGRAM (TEE);  Surgeon: Pixie Casino, MD;  Location: [facility];  Service: Cardiovascular;  Laterality: N/A;    SOCIAL HISTORY: Social History   Socioeconomic History  . Marital status: Married    Spouse name: Not on file  . Number of children: Not on file  . Years of education: Not on file  . Highest education level: Not on file  Occupational History  . Not on file  Social Needs  . Financial resource strain: Not on file  . Food insecurity    Worry: Not on file    Inability: Not on file  . Transportation needs    Medical: Not on file    Non-medical: Not on file  Tobacco Use  . Smoking status: Never Smoker  . Smokeless tobacco: Never Used  Substance and Sexual Activity  . Alcohol use: No  . Drug use: No  . Sexual activity: Not on file  Lifestyle  . Physical activity    Days per week: Not on file    Minutes per session: Not on file  . Stress: Not on file  Relationships  . Social Herbalist on phone: Not on file    Gets together: Not on file    Attends religious service: Not on file    Active member of club or organization: Not on file    Attends meetings of clubs or organizations: Not on file    Relationship status: Not on file  . Intimate partner violence    Fear of current or ex partner: Not on file    Emotionally abused: Not on file    Physically abused: Not on file    Forced sexual activity: Not on file  Other Topics Concern  . Not on file  Social History Narrative  . Not on file   Justin [name] is retired. His daughter is Mudlogger of nursing for a nursing home in [place], [place].    FAMILY HISTORY: Family History  Problem Relation  Age of Onset  . Stroke Mother   . Hypertension Mother   . Alzheimer's disease Father   . Heart failure Father   . Down syndrome Daughter     ALLERGIES:  is allergic to no known allergies.  MEDICATIONS:  Current Outpatient Medications  Medication Sig Dispense Refill  . atorvastatin (LIPITOR) 40 MG tablet TAKE 1 TABLET EVERY MORNING 90 tablet 0  . cyanocobalamin 1000 MCG tablet Take 1,000 mcg by mouth daily.    . ergocalciferol (VITAMIN D2) 1.25 MG (50000 UT)  capsule Take 1 capsule (50,000 Units total) by mouth once a week. 12 capsule 3  . fluorouracil (EFUDEX) 5 % cream Apply 1 application topically daily as needed (for skin cancer prevention).   0  . glucose monitoring kit (FREESTYLE) monitoring kit 1 each by Does not apply route as needed for other.    . metFORMIN (GLUCOPHAGE) 1000 MG tablet Take 1,000 mg by mouth daily with breakfast.    . nitroGLYCERIN (NITROSTAT) 0.4 MG SL tablet Place 1 tablet (0.4 mg total) under the tongue every 5 (five) minutes as needed for chest pain (Max 3 doses in 15 mins.). 30 tablet 0  . omeprazole (PRILOSEC) 20 MG capsule Take 20 mg by mouth every morning.     . Pregabalin (LYRICA PO) Take by mouth.    . REVLIMID 5 MG capsule TAKE 1 CAPSULE DAILY FOR 21 DAYS ON, 7 DAYS OFF, REPEAT EVERY 28 DAYS 21 capsule 0  . telmisartan-hydrochlorothiazide (MICARDIS HCT) 80-12.5 MG tablet Take 1 tablet by mouth daily. 90 tablet 3  . XARELTO 20 MG TABS tablet TAKE 1 TABLET DAILY WITH SUPPER 90 tablet 1   No current facility-administered medications for this visit.     REVIEW OF SYSTEMS:   A 10+ POINT REVIEW OF SYSTEMS WAS OBTAINED including neurology, dermatology, psychiatry, cardiac, respiratory, lymph, extremities, GI, GU, Musculoskeletal, constitutional, breasts, reproductive, HEENT.  All pertinent positives are noted in the HPI.  All others are negative.   PHYSICAL EXAMINATION:   ECOG PERFORMANCE STATUS: 2 - Symptomatic, <50% confined to bed Vitals:   11/23/19  1422  BP: 106/61  Pulse: (!) 58  Resp: 18  Temp: 97.8 F (36.6 C)  SpO2: 99%    Wt Readings from Last 3 Encounters:  11/23/19 185 lb 12.8 oz (84.3 kg)  07/12/19 172 lb 8 oz (78.2 kg)  03/22/19 174 lb (78.9 kg)   Body mass index is 28.25 kg/m.   GENERAL:alert, in no acute distress and comfortable SKIN: no acute rashes, no significant lesions EYES: conjunctiva are pink and non-injected, sclera anicteric OROPHARYNX: MMM, no exudates, no oropharyngeal erythema or ulceration NECK: supple, no JVD LYMPH:  no palpable lymphadenopathy in the cervical, axillary or inguinal regions LUNGS: clear to auscultation b/l with normal respiratory effort HEART: regular rate & rhythm ABDOMEN:  normoactive bowel sounds , non tender, not distended. No palpable hepatosplenomegaly.  Extremity: no pedal edema PSYCH: alert & oriented x 3 with fluent speech NEURO: no focal motor/sensory deficits  LABORATORY DATA:  I have reviewed the data as listed. CBC Latest Ref Rng & Units 11/08/2019 09/06/2019 07/05/2019  WBC 4.0 - 10.5 K/uL 2.4(L) 2.2(L) 3.4(L)  Hemoglobin 13.0 - 17.0 g/dL 10.7(L) 10.3(L) 10.3(L)  Hematocrit 39.0 - 52.0 % 33.1(L) 32.2(L) 31.5(L)  Platelets 150 - 400 K/uL 137(L) 122(L) 118(L)  ANC 1k  CMP Latest Ref Rng & Units 11/08/2019 09/06/2019 07/05/2019  Glucose 70 - 99 mg/dL 115(H) 124(H) 117(H)  BUN 8 - 23 mg/dL 29(H) 27(H) 37(H)  Creatinine 0.61 - 1.24 mg/dL 1.26(H) 1.28(H) 1.28(H)  Sodium 135 - 145 mmol/L 143 141 134(L)  Potassium 3.5 - 5.1 mmol/L 4.7 4.8 4.4  Chloride 98 - 111 mmol/L 109 107 101  CO2 22 - 32 mmol/L 24 29 23   Calcium 8.9 - 10.3 mg/dL 8.9 8.6(L) 8.5(L)  Total Protein 6.5 - 8.1 g/dL 6.5 6.4(L) 6.5  Total Bilirubin 0.3 - 1.2 mg/dL 0.6 0.6 0.7  Alkaline Phos 38 - 126 U/L 60 52 47  AST 15 - 41 U/L  35 21 21  ALT 0 - 44 U/L 21 12 14     Lab Results  Component Value Date   IRON 45 06/02/2017   TIBC 233 06/02/2017   IRONPCTSAT 19 (L) 06/02/2017   (Iron and TIBC)   Lab Results  Component Value Date   FERRITIN 308 08/26/2017         RADIOGRAPHIC STUDIES: I have personally reviewed the radiological images as listed and agreed with the findings in the report.       Bone Marrow Biopsy 12/24/2016 (Accession [id number])  Diagnosis Bone Marrow, Aspirate,Biopsy, and Clot, left iliac crest - HYPERCELLULAR BONE MARROW FOR AGE WITH PLASMA CELL NEOPLASM. - SEE COMMENT. PERIPHERAL BLOOD: - NORMOCYTIC-NORMOCHROMIC ANEMIA. - LEUKOPENIA.  DG Bone Survey Met (Accession [id number]) (Order [id number])  Imaging  Date: 11/28/2016 Department: [facility] Released By: Pricilla Riffle Authorizing: Brunetta Genera, MD  Exam Information   Status Exam Begun  Exam Ended   Final [99] 11/28/2016 11:59 AM 11/28/2016 12:36 PM  PACS Images   Show images for DG Bone Survey Met  Study Result   CLINICAL DATA:  Monoclonal gammopathy of unknown significance. History of squamous cell carcinoma excision, basal cell carcinoma excision.  EXAM: METASTATIC BONE SURVEY  COMPARISON:  CT neck, chest, abdomen and pelvis from 10/29/16, MRI of the head from 06/01/2016, CXR 10/27/2016, lumbar spine radiographs 06/24/2016  FINDINGS: Lateral skull: Small occipital lucency which may represent a normal arachnoid granulation posteriorly in the occiput. No definite lytic abnormality.  Cervical spine AP and lateral: Disc space narrowing at C2-3, and from C4 through C7. C4-5, C5-6 and C6-7 uncovertebral joint spurring bilaterally. No lytic abnormality.  Thoracic spine AP and lateral: T8-9 right-sided osteophytes and to a lesser degree T9-10. No lytic abnormality. Slight multilevel lumbar thoracic disc space narrowing likely degenerative.  Lumbar spine AP and lateral: Disc space narrowing at L4-5 and to a greater extent L5-S1. No lytic disease. L3 through S1 facet sclerosis.  AP pelvis: Negative for lytic disease.   Bilateral upper and lower extremities shoulders through wrist and from the hips through ankle: Negative for lytic disease. Joint space narrowing of the femorotibial compartments both knees. Subchondral cyst of the right patella.  CXR: Clear lungs. Cardiac implantable monitoring device projects over the left heart. Aortic atherosclerosis. No lytic disease.  IMPRESSION: No findings suspicious for lytic disease.   Electronically Signed   By: Ashley Royalty M.D.   On: 11/28/2016 14:58     ASSESSMENT & PLAN:   78 y.o. 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 (12/2016) Peak M spike 2.6  Cytogenetics and Myeloma FISH panel.- trisomy 11 Patient has normocytic anemia without any other clear etiology. (>2g/dl lower that lower limit of normal which is 13) which per criteria would place this in the Multiple myeloma category as opposed to Smoldering Multiple myeloma (Borderline criterion)  No overt renal failure or hypercalcemia at this time No focal bone pains though he has significant chronic back pain related to degenerative disc disease. Bone survey shows no overt lytic lesions.  10/16/18 CT Coronary Morph revealed Coronary artery calcium score 299 Agatston units, this places the patient in the 47th percentile for age and gender, suggesting intermediate risk for future cardiac events. 2. Nonobstructive coronary disease.  12/08/18 Bone Density study revealed that the pt has osteoporosis   07/05/2019 shows an M protein of 0.1.  2) Anemia and  thrombocytopenia -- related to treatment (Revlimid) -stable  3) Grade 2 Neuropathy -- in b/l feet and halfway up lower leg without pain -Seconadry to [employer] -improved of [place]  5) Right kidney cyst with nodularity -Reviewed the 01/05/19 US Abdomen which revealed Septated cyst arising from lower pole right kidney with a questionable solid focus within this lesion.  This nodular area potentially could represent a small neoplasm developing within this complex cyst. Given this circumstance, pre and post contrast MR of the kidneys is advised to further evaluate. If there is a contraindication to MR, pre and post-contrast CT would be a reasonable alternative to further assess. Study otherwise unremarkable. -Pt will likely need urology referral and MRI, pt would like to discuss this further with his PCP before triggering a referral at this point  6) Borderline low B12 levels, previously 299 --- on replacement -  -05/05/19 Vitamin B12 at 586 -continue replacement.   7) recurrent CVA - thought to be related to small vessel disease as per neurology. Has a small PFO which could be an additional risk factor as well as his afib  8)  P afib Plan -Continue on Xarelto per cardiology.  9) H/o recurrent SCC -Underwent surgical excision on right forearm with Dr. Romana Juniper on 07/28/18. His surgical pathology showed evidence of Squamous Cell Carcinoma. Plan -no evidence of recurrence locally at the site of surgery at this time. -continue dermatology followup  10) Lower extremity discomfort - improved, stable. Likely Discogenic pain -[place] on 12/2017 revealed no blood clot -Pt has established care with orthopedist and PT.  11) Patient Active Problem List   Diagnosis Date Noted  . Osteoporosis 03/10/2019  . Multiple myeloma without remission ([place]) 03/10/2019  . Chest pain 09/27/2018  . Multiple myeloma ([place]) 09/27/2018  . Degeneration of lumbar intervertebral disc 01/30/2018  . Low back pain 01/30/2018  . Lumbar radiculopathy 01/30/2018  . History of loop recorder 06/12/2017  . Cryptogenic stroke ([place]) 10/28/2016  . Gait abnormality   . History of CVA (cerebrovascular accident)   . History of TIA (transient ischemic attack)   . Benign essential HTN   . Paroxysmal SVT (supraventricular tachycardia) ([place])   . Acute blood loss anemia   . Acute ischemic stroke  ([place])   . CVA (cerebral vascular accident) ([place]) 10/26/2016  . 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 History of recent stroke 06/06/2016  . 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 TIA (transient ischemic attack) 06/02/2016  . Numbness 06/01/2016  . Right arm numbness 06/01/2016  . Atherosclerosis of native coronary artery of native heart without angina pectoris 03/20/2016  . Hypertension   . GERD (gastroesophageal reflux disease)   . Gout   . S/P RF ablation operation for arrhythmia 12/13/2014  . Hyperlipidemia 12/13/2014  . Controlled type 2 diabetes mellitus with complication, without long-term current use of insulin ([place]) 12/07/2014    PLAN: -Discussed pt labwork today, 11/23/19; all values are WNL except for WBC at 2.4K, RBC at 3.38, Hgb at 10.7, HCT at 33.1, PLTs at 137K, Neutro Abs at 1.2K, Lymphs Abs at 0.6K, Glucose at 115, BUN at 29, Creatinine at 1.26, GFR Est Non Af Am at 54. -Discussed 11/08/2019 MMP shows M protein "Not Observed" -Pt is in clinical remission of Mulitple Myeloma at this time  -Continue PO Vitamin D 2x a week. -Continue Prolia shot as scheduled  q6 months -Will continue Revlimid 59m; 3 weeks on, 1 week off  -The pt has no prohibitive toxicities from continuing Revlimid  at this time  -Will see back in 2 months, with labs 2 weeks before   FOLLOW UP: RTC with Dr Irene Limbo with labs in 2 months . Plz schedule labs 2 weeks prior to clinic visit  The total time spent in the appt was 25 minutes and more than 50% was on counseling and direct patient cares.  All of the patient's questions were answered with apparent satisfaction. The patient knows to call the clinic with any problems, questions or concerns.  Sullivan Lone MD [place] AAHIVMS [facility] Hematology/Oncology Physician [facility]  (Office):       [phone] (Work cell):  [phone] (Fax):           [phone]  I, Yevette Edwards, am acting as a scribe for Dr. Sullivan Lone.   .I have reviewed  the above documentation for accuracy and completeness, and I agree with the above. Brunetta Genera MD

## 2019-11-23 ENCOUNTER — Inpatient Hospital Stay: Payer: Medicare Other | Attending: Hematology | Admitting: Hematology

## 2019-11-23 ENCOUNTER — Other Ambulatory Visit: Payer: Self-pay

## 2019-11-23 ENCOUNTER — Telehealth: Payer: Self-pay | Admitting: Hematology

## 2019-11-23 VITALS — BP 106/61 | HR 58 | Temp 97.8°F | Resp 18 | Ht 68.0 in | Wt 185.8 lb

## 2019-11-23 DIAGNOSIS — C9001 Multiple myeloma in remission: Secondary | ICD-10-CM

## 2019-11-23 DIAGNOSIS — Z7901 Long term (current) use of anticoagulants: Secondary | ICD-10-CM | POA: Diagnosis not present

## 2019-11-23 DIAGNOSIS — D6481 Anemia due to antineoplastic chemotherapy: Secondary | ICD-10-CM | POA: Diagnosis not present

## 2019-11-23 DIAGNOSIS — Z85828 Personal history of other malignant neoplasm of skin: Secondary | ICD-10-CM | POA: Diagnosis not present

## 2019-11-23 DIAGNOSIS — C9000 Multiple myeloma not having achieved remission: Secondary | ICD-10-CM | POA: Diagnosis present

## 2019-11-23 DIAGNOSIS — I480 Paroxysmal atrial fibrillation: Secondary | ICD-10-CM | POA: Insufficient documentation

## 2019-11-23 DIAGNOSIS — N281 Cyst of kidney, acquired: Secondary | ICD-10-CM | POA: Insufficient documentation

## 2019-11-23 DIAGNOSIS — G8929 Other chronic pain: Secondary | ICD-10-CM | POA: Diagnosis not present

## 2019-11-23 DIAGNOSIS — D61810 Antineoplastic chemotherapy induced pancytopenia: Secondary | ICD-10-CM | POA: Insufficient documentation

## 2019-11-23 DIAGNOSIS — Z8673 Personal history of transient ischemic attack (TIA), and cerebral infarction without residual deficits: Secondary | ICD-10-CM | POA: Diagnosis not present

## 2019-11-23 DIAGNOSIS — T451X5A Adverse effect of antineoplastic and immunosuppressive drugs, initial encounter: Secondary | ICD-10-CM | POA: Diagnosis not present

## 2019-11-23 DIAGNOSIS — M549 Dorsalgia, unspecified: Secondary | ICD-10-CM | POA: Diagnosis not present

## 2019-11-23 DIAGNOSIS — M810 Age-related osteoporosis without current pathological fracture: Secondary | ICD-10-CM | POA: Diagnosis not present

## 2019-11-23 DIAGNOSIS — I639 Cerebral infarction, unspecified: Secondary | ICD-10-CM

## 2019-11-23 NOTE — Telephone Encounter (Signed)
Gave avs and calendar ° °

## 2019-11-28 ENCOUNTER — Other Ambulatory Visit: Payer: Self-pay | Admitting: Hematology

## 2019-11-29 ENCOUNTER — Other Ambulatory Visit: Payer: Self-pay | Admitting: Hematology

## 2019-11-29 ENCOUNTER — Telehealth: Payer: Self-pay

## 2019-11-29 NOTE — Telephone Encounter (Signed)
Prescription re sent to [facility] for Revlimid authorization number [id number]

## 2019-12-14 NOTE — Progress Notes (Signed)
ILR remote 

## 2019-12-20 ENCOUNTER — Telehealth: Payer: Self-pay

## 2019-12-20 NOTE — Telephone Encounter (Signed)
Patient has concerns about no longer being followed by Dr Rayann Heman afterLINQ reaching RRT. Explained that no longer will be able to monitor device because battery is depleted. Patient has concerns about no longer being monitored and would like to speak to Dr Rayann Heman. Patient does have smart phone capabilities and is open to a virtual visit . Will send to scheduling to have patient set up for virtual appointment with Dr Rayann Heman.

## 2019-12-20 NOTE — Telephone Encounter (Signed)
The pt called upset because he got a return kit mailed to him. I told him per [employer] she sent to him he has reached that RRT. He no longer needs to be follow remotely because his battery in the loop recorder has died. I told him he could have it removed or he can choose to leave it in. The pt asked do he still need to be followed physically need to be followed by his device. I let him talk with Jenny Reichmann, RN.

## 2019-12-25 ENCOUNTER — Other Ambulatory Visit: Payer: Self-pay | Admitting: Hematology

## 2019-12-29 ENCOUNTER — Other Ambulatory Visit: Payer: Self-pay

## 2019-12-29 ENCOUNTER — Encounter: Payer: Self-pay | Admitting: Internal Medicine

## 2019-12-29 ENCOUNTER — Telehealth (INDEPENDENT_AMBULATORY_CARE_PROVIDER_SITE_OTHER): Payer: Medicare Other | Admitting: Internal Medicine

## 2019-12-29 ENCOUNTER — Ambulatory Visit (INDEPENDENT_AMBULATORY_CARE_PROVIDER_SITE_OTHER): Payer: Medicare Other | Admitting: Internal Medicine

## 2019-12-29 VITALS — Ht 68.0 in | Wt 180.0 lb

## 2019-12-29 VITALS — BP 131/67 | HR 57 | Ht 68.0 in | Wt 185.0 lb

## 2019-12-29 DIAGNOSIS — I10 Essential (primary) hypertension: Secondary | ICD-10-CM

## 2019-12-29 DIAGNOSIS — I480 Paroxysmal atrial fibrillation: Secondary | ICD-10-CM

## 2019-12-29 DIAGNOSIS — Z8673 Personal history of transient ischemic attack (TIA), and cerebral infarction without residual deficits: Secondary | ICD-10-CM | POA: Diagnosis not present

## 2019-12-29 DIAGNOSIS — I639 Cerebral infarction, unspecified: Secondary | ICD-10-CM

## 2019-12-29 MED ORDER — METFORMIN HCL 1000 MG PO TABS: 1000.0000 mg | ORAL_TABLET | Freq: Every day | ORAL | 3 refills | Status: DC

## 2019-12-29 MED ORDER — ATORVASTATIN CALCIUM 40 MG PO TABS: 40.0000 mg | ORAL_TABLET | Freq: Every morning | ORAL | 3 refills | Status: DC

## 2019-12-29 MED ORDER — TELMISARTAN-HCTZ 80-12.5 MG PO TABS: 1.0000 | ORAL_TABLET | Freq: Every day | ORAL | 3 refills | Status: DC

## 2019-12-29 NOTE — Progress Notes (Signed)
OFFICE NOTE  Chief Complaint:  Follow-up  Primary Care Physician: Justin Neer, MD  HPI:  Justin [name] is a pleasant 79 year old male who I met recently in the hospital. He presented with chest pain that he developed while sitting his computer. The symptoms had some typical and atypical features for angina. He ruled out for MI and underwent nuclear stress testing which was negative for ischemia. During the stress test while exercising he developed an SVT with heart rate that shot up abruptly to 220. This responded to vagal maneuvers and then reoccurred and eventually subsided. He was started on low-dose beta blocker and eventually discharged without any recurrent symptoms. He was placed on a monitor and has follow-up with cardiac electrophysiology to see Dr. Lovena [name] in January. 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 He was started on Lipitor which was started the hospital for for coronary disease, however his LDL is only 95. He is a diabetic. His metformin was increased to 500 twice a day which is improved his blood sugars. He recently saw his primary care provider felt that he was doing well.  I saw Justin [name] back in the office today. He was referred to Dr. Cristopher [name] for SVT ablation consideration, and this was offered to him. Prior to having this scheduled he developed an episode of chest pain again and that brought him to the hospital yesterday. He was evaluated by Dr. Dorris [name], and had a right upper quadrant ultrasound which was unremarkable. He also had coronary CT angiogram which demonstrated mild to moderate coronary disease and an intermediate coronary calcium score of 202. He has subsequently worn a monitor between 12/21/2015 and 01/18/2015 which showed no recurrent SVT and normal sinus rhythm.  The etiology of his pain episodes is unclear however may be related to  reflux.   Justin [name] returns today for follow-up. He reports he is done very well without any recurrent SVT status post catheter ablation by Dr. Cristopher [name]. He is seeing me primarily for risk factor modification and treatment of asymptomatic coronary artery disease which was seen by coronary artery CT angiography. He was found to have 2 areas of mild coronary artery disease and a calcium score which was moderately elevated at 202. Since then he's been on lipid therapy with Lipitor and takes daily aspirin as well as my card is HCTZ for hypertension. Blood pressure is well-controlled today 106/60. He is due for repeat lipid profile and will need a refill on his cholesterol medication. He denies any chest pain or shortness of breath. 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  06/06/2016  [name] returns today for follow-up. He was seen by myself in the hospital after he presented with symptoms of possible TIA. This is following recent hospitalization at [facility] in [place] where he had a stroke. MRI confirmed this although he has had no significant lasting deficit. His initial symptoms were left arm heaviness and numbness. He also subsequently had some right arm tingling but the symptoms were different than his original presentation. This was thought to be TIA. He was evaluated by neurology however repeat imaging failed to show any new deficits. Subsequently he was referred for a TEE which I performed. This demonstrated a small bidirectional PFO. He had a bifid left atrial appendage without  any thrombus. While he did have PFO, it's not clear that this was the etiology of his stroke or TIA. Venous Dopplers of the legs were negative for thrombus. He was changed from aspirin to Plavix. We also recommended and implanted loop recorder which was placed by Dr. Rayann [name]. The thought is that he may be having intermittent atrial fibrillation, as he has a history of  SVT underwent ablation.  06/12/2017  Justin [name] returns today for follow-up. He's had a very eventful year. 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 He's had problems with recurrent skin cancer. He has had no further stroke or TIA and has done well on aspirin and Plavix. Recently his neurologist he discontinued Plavix and is currently only on full dose aspirin. He did have an implanted loop recorder and multiple interrogations have failed to show any recurrent or intermittent atrial fibrillation. He denies any recurrent SVT. This is not been noted as well. Blood pressure is well controlled and he said recent weight loss which is appropriate at this point.  10/01/2018  Justin [name] is seen today in follow-up.  Is been over a year since I saw him.  Unfortunately was recently admitted with precordial chest pain last week.  He ruled out for MI and an outpatient echocardiogram was recommended.  Demonstrated LVEF 55 to 60%, mild LVH and grade 1 diastolic dysfunction.  No regional wall motion abnormalities.  Then he reports no recurrent chest pain.  He continues however to have some physical decline.  He was noted to be using a walker today which she did not last year.  Partially may be related to stroke, however previously he was flying airplanes 2 years ago.  Denies recurrent palpitations.  Loop recorder interrogation is failed to show recurrent arrhythmia.  11/04/2018  Justin [name] returns today for follow-up.  He underwent CT coronary angiography which showed no obstructive coronary disease and a mild increase in coronary calcium to 229 compared to study in 2016.  I do suspect his chest pain is noncardiac.  He actually now saying that he is having more belching and gas and discomfort after eating which sounds GI in nature.  He does not have a current gastroenterologist.  12/30/2019  Justin [name] returns today for annual follow-up.  Earlier this morning he had a virtual visit with Dr. Rayann [name].   This was for follow-up of his loop recorder which is now at end of battery life.  Ultimately he will want it explanted.  He did not show any further events.  He had brief episodes of A. fib and has been anticoagulated for that but has not had recent recurrence.  Nonetheless since we will be monitoring him forward, I would recommend he remain on lifelong anticoagulation with Xarelto.  Overall he says he is doing much better.  He says he is in remission with regards to his multiple myeloma.  Cholesterols been well controlled with recent lipid profile showing total cholesterol 126, triglycerides 154, HDL 66 and LDL of 63.  He is interested in possibly getting back to flying and might consider the basic med program.  PMHx:  Past Medical History:  Diagnosis Date  . Anemia   . Arthritis   . 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 Diabetes mellitus without complication ([place])   . GERD (gastroesophageal reflux disease)   . Gout   . History of loop recorder   . Hypertension   . Left leg  weakness   . Multiple myeloma ([place]) 09/27/2018  . Paroxysmal SVT (supraventricular tachycardia) ([place])    a. s/p RFCA on 05/01/15  . Stroke [facility]) 2017   TIA and mild stroke; denies stroke symptoms since then     Past Surgical History:  Procedure Laterality Date  . ATRIAL FIBRILLATION ABLATION     Dr. Lovena [name]  . BASAL CELL CARCINOMA EXCISION     off of back  . ELECTROPHYSIOLOGIC STUDY N/A 05/01/2015   Procedure: SVT Ablation;  Surgeon: Evans Lance, MD;  Location: [place];  Service: Cardiovascular;  Laterality: N/A;  . EP IMPLANTABLE DEVICE N/A 06/04/2016   Procedure: Loop Recorder Insertion;  Surgeon: Thompson Grayer, MD;  Location: [place];  Service: Cardiovascular;  Laterality: N/A;  . INGUINAL HERNIA REPAIR Bilateral 06/16/2017   Procedure: LAPAROSCOPIC BILATERAL INGUINAL HERNIA REPAIR;  Surgeon: Clovis Riley, MD;  Location: [facility];  Service: General;   Laterality: Bilateral;  . INSERTION OF MESH Bilateral 06/16/2017   Procedure: INSERTION OF MESH;  Surgeon: Clovis Riley, MD;  Location: [facility];  Service: General;  Laterality: Bilateral;  . MASS EXCISION Right 07/28/2018   Procedure: EXCISION OF RIGHT FOREARM MASS;  Surgeon: Clovis Riley, MD;  [facility]: [facility];  Service: General;  Laterality: Right;  . SQUAMOUS CELL CARCINOMA EXCISION    . TEE WITHOUT CARDIOVERSION N/A 06/04/2016   Procedure: TRANSESOPHAGEAL ECHOCARDIOGRAM (TEE);  Surgeon: Pixie Casino, MD;  Location: [facility];  Service: Cardiovascular;  Laterality: N/A;    FAMHx:  Family History  Problem Relation Age of Onset  . Stroke Mother   . Hypertension Mother   . Alzheimer's disease Father   . Heart failure Father   . Down syndrome Daughter     SOCHx:   reports that he has never smoked. He has never used smokeless tobacco. He reports that he does not drink alcohol or use drugs.  ALLERGIES:  Allergies  Allergen Reactions  . No Known Allergies     ROS: Pertinent items noted in HPI and remainder of comprehensive ROS otherwise negative.  HOME MEDS: Current Outpatient Medications  Medication Sig Dispense Refill  . atorvastatin (LIPITOR) 40 MG tablet TAKE 1 TABLET EVERY MORNING 90 tablet 0  . cyanocobalamin 1000 MCG tablet Take 1,000 mcg by mouth daily.    . ergocalciferol (VITAMIN D2) 1.25 MG (50000 UT) capsule Take 1 capsule (50,000 Units total) by mouth once a week. 12 capsule 3  . fluorouracil (EFUDEX) 5 % cream Apply 1 application topically daily as needed (for skin cancer prevention).   0  . glucose monitoring kit (FREESTYLE) monitoring kit 1 each by Does not apply route as needed for other.    . metFORMIN (GLUCOPHAGE) 1000 MG tablet Take 1,000 mg by mouth daily with breakfast.    . nitroGLYCERIN (NITROSTAT) 0.4 MG SL tablet Place 1 tablet (0.4 mg total) under the tongue every 5 (five) minutes as needed for chest pain (Max 3 doses in 15 mins.). 30 tablet 0   . omeprazole (PRILOSEC) 20 MG capsule Take 20 mg by mouth every morning.     . pregabalin (LYRICA) 75 MG capsule Take 75 mg by mouth 3 (three) times daily.    [name] Kitchen REVLIMID 5 MG capsule TAKE 1 CAPSULE DAILY FOR 21 DAYS ON, 7 DAYS OFF, REPEAT EVERY 28 DAYS 21 capsule 0  . telmisartan-hydrochlorothiazide (MICARDIS HCT) 80-12.5 MG tablet Take 1 tablet by mouth daily. 90 tablet 3  . XARELTO 20 MG  TABS tablet TAKE 1 TABLET DAILY WITH SUPPER 90 tablet 1   No current facility-administered medications for this visit.    LABS/IMAGING: No results found for this or any previous visit (from the past 48 hour(s)). No results found.  VITALS: BP 131/67   Pulse (!) 57   Ht 5' 8"  (1.727 m)   Wt 185 lb (83.9 kg)   BMI 28.13 kg/m   EXAM: Deferred  EKG: Sinus bradycardia with a PAC at 57-personally reviewed  ASSESSMENT: 1. Chest pain -nonobstructive coronary artery disease by cardiac CT, calcium score 229 (09/2018) 2. Stroke/TIA 3. PAF - brief, found on ILR - on Xarelto 4. Small bidirectional PFO by TEE -on long-term Xarelto 5. Mild CAD - coronary CT angiogram showed mild to moderate coronary disease which was nonobstructive and an intermediate coronary calcium score of 202 (2016) 6. PSVT s/p ablation by Dr. Lovena [name] 7. Diabetes type 2 -controlled 8. Relative dyslipidemia 9. Multiple myeloma -in remission 10. Skin cancer  PLAN: 1.   Mr. Drost is doing well without recurrent stroke or TIA.  He will be on long-term Xarelto.  He did have a small bidirectional PFO on TEE however there is little evidence to close that given his age and the fact that he will require long-term anticoagulation for A. fib.  Fortunately there has been no recurrence of that and he remains in sinus today.  He said no chest pain or coronary symptoms.  He has had no recurrent SVT after ablation.  His multiple myeloma is in remission and generally continues to be doing better.  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  Follow-up annually or sooner as necessary.  Pixie Casino, MD, [facility], [place] Director of the Advanced Lipid Disorders &  Cardiovascular Risk Reduction Clinic Diplomate of the American Board of Clinical Lipidology Attending Cardiologist  Direct Dial: [phone]  Fax: [phone]  Website:  [email or web address] 12/29/2019, 4:31 PM

## 2019-12-29 NOTE — Progress Notes (Signed)
Electrophysiology TeleHealth Note   Due to national recommendations of social distancing due to COVID 19, an audio/video telehealth visit is felt to be most appropriate for this patient at this time.  See MyChart message from today for the patient's consent to telehealth for [facility].   Date:  12/29/2019   ID:  Justin [name], DOB 02/02/1941, MRN [id number]  Location: patient's home  Provider location:  [facility]  Evaluation Performed: Follow-up visit  PCP:  Mayra Neer, MD   Electrophysiologist:  Dr Rayann Heman  Chief Complaint:  palpitations  History of Present Illness:    Justin [name] is a 79 y.o. male who presents via telehealth conferencing today.  Since last being seen in our clinic, the patient reports doing very well.  He is in remission from his multiple myeloma.  Today, he denies symptoms of palpitations, chest pain, shortness of breath,  lower extremity edema, dizziness, presyncope, or syncope.  The patient is otherwise without complaint today.  The patient denies symptoms of fevers, chills, cough, or new SOB worrisome for COVID 19.  Past Medical History:  Diagnosis Date  . Anemia   . Arthritis   . 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 Diabetes mellitus without complication ([place])   . GERD (gastroesophageal reflux disease)   . Gout   . History of loop recorder   . Hypertension   . Left leg weakness   . Multiple myeloma ([place]) 09/27/2018  . Paroxysmal SVT (supraventricular tachycardia) ([place])    a. s/p RFCA on 05/01/15  . Stroke [facility]) 2017   TIA and mild stroke; denies stroke symptoms since then     Past Surgical History:  Procedure Laterality Date  . ATRIAL FIBRILLATION ABLATION     Dr. Lovena Le  . BASAL CELL CARCINOMA EXCISION     off of back  . ELECTROPHYSIOLOGIC STUDY N/A 05/01/2015   Procedure: SVT Ablation;  Surgeon: Evans Lance, MD;  Location: [place] [facility];  Service: Cardiovascular;  Laterality: N/A;  . EP  IMPLANTABLE DEVICE N/A 06/04/2016   Procedure: Loop Recorder Insertion;  Surgeon: Thompson Grayer, MD;  Location: [facility];  Service: Cardiovascular;  Laterality: N/A;  . INGUINAL HERNIA REPAIR Bilateral 06/16/2017   Procedure: LAPAROSCOPIC BILATERAL INGUINAL HERNIA REPAIR;  Surgeon: Clovis Riley, MD;  Location: [place];  Service: General;  Laterality: Bilateral;  . INSERTION OF MESH Bilateral 06/16/2017   Procedure: INSERTION OF MESH;  Surgeon: Clovis Riley, MD;  Location: [facility];  Service: General;  Laterality: Bilateral;  . MASS EXCISION Right 07/28/2018   Procedure: EXCISION OF RIGHT FOREARM MASS;  Surgeon: Clovis Riley, MD;  [facility];  Service: General;  Laterality: Right;  . SQUAMOUS CELL CARCINOMA EXCISION    . TEE WITHOUT CARDIOVERSION N/A 06/04/2016   Procedure: TRANSESOPHAGEAL ECHOCARDIOGRAM (TEE);  Surgeon: Pixie Casino, MD;  Location: [facility] ENDOSCOPY;  Service: Cardiovascular;  Laterality: N/A;    Current Outpatient Medications  Medication Sig Dispense Refill  . atorvastatin (LIPITOR) 40 MG tablet TAKE 1 TABLET EVERY MORNING 90 tablet 0  . cyanocobalamin 1000 MCG tablet Take 1,000 mcg by mouth daily.    . ergocalciferol (VITAMIN D2) 1.25 MG (50000 UT) capsule Take 1 capsule (50,000 Units total) by mouth once a week. 12 capsule 3  . fluorouracil (EFUDEX) 5 % cream Apply 1 application topically daily as needed (for skin cancer prevention).   0  . glucose monitoring  kit (FREESTYLE) monitoring kit 1 each by Does not apply route as needed for other.    . metFORMIN (GLUCOPHAGE) 1000 MG tablet Take 1,000 mg by mouth daily with breakfast.    . nitroGLYCERIN (NITROSTAT) 0.4 MG SL tablet Place 1 tablet (0.4 mg total) under the tongue every 5 (five) minutes as needed for chest pain (Max 3 doses in 15 mins.). 30 tablet 0  . omeprazole (PRILOSEC) 20 MG capsule Take 20 mg by mouth every morning.     . pregabalin (LYRICA) 75 MG capsule Take 75 mg by mouth 3 (three) times  daily.    [name] Kitchen REVLIMID 5 MG capsule TAKE 1 CAPSULE DAILY FOR 21 DAYS ON, 7 DAYS OFF, REPEAT EVERY 28 DAYS 21 capsule 0  . telmisartan-hydrochlorothiazide (MICARDIS HCT) 80-12.5 MG tablet Take 1 tablet by mouth daily. 90 tablet 3  . XARELTO 20 MG TABS tablet TAKE 1 TABLET DAILY WITH SUPPER 90 tablet 1   No current facility-administered medications for this visit.    Allergies:   No known allergies   Social History:  The patient  reports that he has never smoked. He has never used smokeless tobacco. He reports that he does not drink alcohol or use drugs.   Family History:  The patient's  family history includes Alzheimer's disease in his father; Down syndrome in his daughter; Heart failure in his father; Hypertension in his mother; Stroke in his mother.   ROS:  Please see the history of present illness.   All other systems are personally reviewed and negative.    Exam:    Vital Signs:  Ht _0  (1.727 m)   Wt 180 lb (81.6 kg)   BMI 27.37 kg/m   Well sounding and appearing, alert and conversant, regular work of breathing,  good skin color Eyes- anicteric, neuro- grossly intact, skin- no apparent rash or lesions or cyanosis, mouth- oral mucosa is pink  Labs/Other Tests and Data Reviewed:    Recent Labs: 11/08/2019: ALT 21; BUN 29; Creatinine 1.26; Hemoglobin 10.7; Platelet Count 137; Potassium 4.7; Sodium 143   Wt Readings from Last 3 Encounters:  12/29/19 180 lb (81.6 kg)  11/23/19 185 lb 12.8 oz (84.3 kg)  07/12/19 172 lb 8 oz (78.2 kg)     Last device remote is reviewed from [place] PDF which reveals normal device function afib is well controlled   ASSESSMENT & PLAN:    1.  Paroxysmal atrial fibrillation Well controlled His ILR is at RRT.  I have offered removal of the device.  He would like to proceed either after his vaccine or after COVID 19 has passed  2. CAD Mild  Followed by Dr Debara Pickett  3. Prior stroke Continue long term anticoagulation  Follow-up:  12 months  with me in office   Patient Risk:  after full review of this patients clinical status, I feel that they are at moderate risk at this time.  Today, I have spent 15 minutes with the patient with telehealth technology discussing arrhythmia management .    Army Fossa, MD  12/29/2019 9:20 AM     [facility] [address] [place] [address] [phone] (office) [phone] (fax)

## 2019-12-29 NOTE — Patient Instructions (Signed)
Medication Instructions:  NO CHANGES *If you need a refill on your cardiac medications before your next appointment, please call your pharmacy*  Lab Work: None ordered  Testing/Procedures: None ordered  Follow-Up: At [facility], you and your health needs are our priority.  As part of our continuing mission to provide you with exceptional heart care, we have created designated Provider Care Teams.  These Care Teams include your primary Cardiologist (physician) and Advanced Practice Providers (APPs -  Physician Assistants and Nurse Practitioners) who all work together to provide you with the care you need, when you need it.  Your next appointment:   1 year(s)  The format for your next appointment:   In Person  Provider:   K. Mali Hilty, MD

## 2019-12-30 ENCOUNTER — Encounter: Payer: Self-pay | Admitting: Internal Medicine

## 2019-12-31 ENCOUNTER — Other Ambulatory Visit: Payer: Self-pay | Admitting: *Deleted

## 2019-12-31 DIAGNOSIS — C9001 Multiple myeloma in remission: Secondary | ICD-10-CM

## 2019-12-31 MED ORDER — REVLIMID 5 MG PO CAPS: 5.0000 mg | ORAL_CAPSULE | Freq: Every day | ORAL | 0 refills | Status: DC

## 2019-12-31 NOTE — Telephone Encounter (Signed)
Requested refill of Revlimid 5 mg Refilled by escribe to [place], [place] per Dr. Irene Limbo OV note 11/23/19 [facility] Auth# [id number], 12/31/19

## 2020-01-10 ENCOUNTER — Inpatient Hospital Stay: Payer: Medicare Other | Attending: Hematology

## 2020-01-10 DIAGNOSIS — C9000 Multiple myeloma not having achieved remission: Secondary | ICD-10-CM | POA: Insufficient documentation

## 2020-01-11 ENCOUNTER — Telehealth: Payer: Self-pay | Admitting: Hematology

## 2020-01-11 NOTE — Telephone Encounter (Signed)
Returned patient's phone call regarding rescheduling an appointment, left a voicemail. 

## 2020-01-14 ENCOUNTER — Telehealth: Payer: Self-pay | Admitting: Hematology

## 2020-01-14 NOTE — Telephone Encounter (Signed)
Returned patient's phone call regarding rescheduling 02/01 appointment, per patient's request appointment has moved to 02/09.

## 2020-01-14 NOTE — Telephone Encounter (Signed)
Patient is aware of lab

## 2020-01-17 ENCOUNTER — Other Ambulatory Visit: Payer: Self-pay

## 2020-01-17 ENCOUNTER — Inpatient Hospital Stay: Payer: Medicare Other

## 2020-01-17 DIAGNOSIS — C9000 Multiple myeloma not having achieved remission: Secondary | ICD-10-CM | POA: Diagnosis not present

## 2020-01-17 DIAGNOSIS — C9001 Multiple myeloma in remission: Secondary | ICD-10-CM

## 2020-01-17 LAB — CBC WITH DIFFERENTIAL/PLATELET
Abs Immature Granulocytes: 0.01 10*3/uL (ref 0.00–0.07)
Basophils Absolute: 0.1 10*3/uL (ref 0.0–0.1)
Basophils Relative: 2 %
Eosinophils Absolute: 0.2 10*3/uL (ref 0.0–0.5)
Eosinophils Relative: 6 %
HCT: 35.9 % — ABNORMAL LOW (ref 39.0–52.0)
Hemoglobin: 11.5 g/dL — ABNORMAL LOW (ref 13.0–17.0)
Immature Granulocytes: 0 %
Lymphocytes Relative: 20 %
Lymphs Abs: 0.6 10*3/uL — ABNORMAL LOW (ref 0.7–4.0)
MCH: 30 pg (ref 26.0–34.0)
MCHC: 32 g/dL (ref 30.0–36.0)
MCV: 93.7 fL (ref 80.0–100.0)
Monocytes Absolute: 0.4 10*3/uL (ref 0.1–1.0)
Monocytes Relative: 12 %
Neutro Abs: 1.9 10*3/uL (ref 1.7–7.7)
Neutrophils Relative %: 60 %
Platelets: 126 10*3/uL — ABNORMAL LOW (ref 150–400)
RBC: 3.83 MIL/uL — ABNORMAL LOW (ref 4.22–5.81)
RDW: 14.4 % (ref 11.5–15.5)
WBC: 3.2 10*3/uL — ABNORMAL LOW (ref 4.0–10.5)
nRBC: 0 % (ref 0.0–0.2)

## 2020-01-17 LAB — CMP (CANCER CENTER ONLY)
ALT: 10 U/L (ref 0–44)
AST: 19 U/L (ref 15–41)
Albumin: 3.8 g/dL (ref 3.5–5.0)
Alkaline Phosphatase: 62 U/L (ref 38–126)
Anion gap: 9 (ref 5–15)
BUN: 28 mg/dL — ABNORMAL HIGH (ref 8–23)
CO2: 26 mmol/L (ref 22–32)
Calcium: 9 mg/dL (ref 8.9–10.3)
Chloride: 107 mmol/L (ref 98–111)
Creatinine: 1.32 mg/dL — ABNORMAL HIGH (ref 0.61–1.24)
GFR, Est AFR Am: 59 mL/min — ABNORMAL LOW (ref >60)
GFR, Estimated: 51 mL/min — ABNORMAL LOW (ref >60)
Glucose, Bld: 125 mg/dL — ABNORMAL HIGH (ref 70–99)
Potassium: 4.2 mmol/L (ref 3.5–5.1)
Sodium: 142 mmol/L (ref 135–145)
Total Bilirubin: 0.7 mg/dL (ref 0.3–1.2)
Total Protein: 6.5 g/dL (ref 6.5–8.1)

## 2020-01-21 ENCOUNTER — Ambulatory Visit: Payer: Medicare Other

## 2020-01-21 LAB — MULTIPLE MYELOMA PANEL, SERUM
Albumin SerPl Elph-Mcnc: 3.7 g/dL (ref 2.9–4.4)
Albumin/Glob SerPl: 1.6 (ref 0.7–1.7)
Alpha 1: 0.2 g/dL (ref 0.0–0.4)
Alpha2 Glob SerPl Elph-Mcnc: 0.6 g/dL (ref 0.4–1.0)
B-Globulin SerPl Elph-Mcnc: 0.8 g/dL (ref 0.7–1.3)
Gamma Glob SerPl Elph-Mcnc: 0.7 g/dL (ref 0.4–1.8)
Globulin, Total: 2.4 g/dL (ref 2.2–3.9)
IgA: 150 mg/dL (ref 61–437)
IgG (Immunoglobin G), Serum: 893 mg/dL (ref 603–1613)
IgM (Immunoglobulin M), Srm: 20 mg/dL (ref 15–143)
Total Protein ELP: 6.1 g/dL (ref 6.0–8.5)

## 2020-01-24 ENCOUNTER — Ambulatory Visit: Payer: Medicare Other | Admitting: Hematology

## 2020-01-29 ENCOUNTER — Ambulatory Visit: Payer: Medicare Other

## 2020-01-29 ENCOUNTER — Ambulatory Visit: Payer: Medicare Other | Attending: Internal Medicine

## 2020-01-29 DIAGNOSIS — Z23 Encounter for immunization: Secondary | ICD-10-CM | POA: Insufficient documentation

## 2020-01-29 NOTE — Progress Notes (Signed)
   Covid-19 Vaccination Clinic  Name:  Justin [name]    MRN: [id number] DOB: 19-Nov-1941  01/29/2020  [name] was observed post Covid-19 immunization for 15 minutes without incidence. He was provided with Vaccine Information Sheet and instruction to access the V-Safe system.   [name] was instructed to call 911 with any severe reactions post vaccine: [name] Kitchen Difficulty breathing  . Swelling of your face and throat  . A fast heartbeat  . A bad rash all over your body  . Dizziness and weakness    Immunizations Administered    Name Date Dose VIS Date Route   Pfizer COVID-19 Vaccine 01/29/2020  9:09 AM 0.3 mL 12/03/2019 Intramuscular   Manufacturer: [place]   Lot: [id number]   [place]: [id number]

## 2020-01-31 ENCOUNTER — Other Ambulatory Visit: Payer: Self-pay | Admitting: *Deleted

## 2020-01-31 DIAGNOSIS — C9001 Multiple myeloma in remission: Secondary | ICD-10-CM

## 2020-01-31 MED ORDER — REVLIMID 5 MG PO CAPS: 5.0000 mg | ORAL_CAPSULE | Freq: Every day | ORAL | 0 refills | Status: DC

## 2020-01-31 NOTE — Telephone Encounter (Signed)
Received faxed refill request from [place] for Revlimid Refilled per Dr. Irene Limbo OV note 11/22/20 Refill escribed to [facility], [place] Auth# [id number], 01/31/20

## 2020-01-31 NOTE — Progress Notes (Signed)
HEMATOLOGY/ONCOLOGY CLINIC NOTE  Date of Service: 02/01/2020   Patient Care Team: Justin Neer, MD as PCP - General (Family Medicine) Justin Pickett Nadean Corwin, MD as PCP - Cardiology (Cardiology)   CHIEF COMPLAINTS/:  F/u for continued mx of Myeloma   HISTORY OF PRESENTING ILLNESS:  Justin [name] is a wonderful 79 y.o. 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  Patient has a history of hypertension, diabetes, GERD, gout, squamous cell skin cancers, elevated PSA, SVT status post ablation in May 2016, chronic back pain who has a history of recurrent CVA/TIA's. He apparently had a left hemispheric TIA June 2017 and has had recurrent) subcortical infarcts thought to be related to small vessel disease. He was noted to have a small PFO that was of questionable significance. He was initially on aspirin and then continued and aspirin + plavix for secondary stroke prevention. He follows with Dr. Antony [name] for his neurology cares.  An SPEP was done due to elevated total proteins of 8.9 and showed an M spike of 2 g/dL. He was also noted to have an elevated total protein of 8.6 in April 2017. He was referred to [place] for further evaluation of his M spike. Blood test did not reveal any hypercalcemia, no significant renal failure. He has been noted to have developed anemia since June the serum and his hemoglobin was 12.6 and is now down to the mid 10 range. He notes no focal bone pains at this time. Overt acute weight loss. No fevers no chills no night sweats.   CURRENT THERAPY:   Revlimid maintenance 12m po 3 weeks on 1 week off.   INTERVAL HISTORY  WKairav Russomannowas seen today for follow-up of his myeloma. The patient's last visit with [name] on 11/23/2019. The pt reports that he is doing well overall.  The pt reports that he tripped on a pallet while going to get a Christmas tree. He fell and was sore, but was otherwise fine. Pt  received his first dose of the COVID19 vaccine about a week ago. He already has his second dose scheduled. He denies any issues with the vaccine.   Pt reports a runny nose for over a year and intermittent diarrhea. He has to go to the bathroom no more than 3-4 times per day when he does have diarrhea. It is not hindering him from doing activities at this time.   He is planning at trip in May or April [place] He is currently planning on flying during this trip.   Lab results (01/17/20) of CBC w/diff and CMP is as follows: all values are WNL except for WBC at 3.2K, RBC at 3.83, Hgb at 11.5, HCT at 35.9, PLT at 126K, Lymphs Abs at 0.6K, Glucose at 125, BUN at 28, Creatinine at 1.32, GFR Est Non Af Am at 51. 01/17/2020 MMP is as follows: all values are WNL.  On review of systems, pt reports rhinorrhea, diarrhea and denies abdominal pain, leg swelling and any other symptoms.   MEDICAL HISTORY:  Past Medical History:  Diagnosis Date  . Anemia   . Arthritis   . 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 Diabetes mellitus without complication ([place]   . GERD (gastroesophageal reflux disease)   . Gout   . History of loop recorder   . Hypertension   . Left  leg weakness   . Multiple myeloma ([place]) 09/27/2018  . Paroxysmal SVT (supraventricular tachycardia) ([place])    a. s/p RFCA on 05/01/15  . Stroke [facility]) 2017   TIA and mild stroke; denies stroke symptoms since then     SURGICAL HISTORY: Past Surgical History:  Procedure Laterality Date  . ATRIAL FIBRILLATION ABLATION     Justin [name]  . BASAL CELL CARCINOMA EXCISION     off of back  . ELECTROPHYSIOLOGIC STUDY N/A 05/01/2015   Procedure: SVT Ablation;  Surgeon: Justin Lance, MD;  Location: [facility];  Service: Cardiovascular;  Laterality: N/A;  . EP IMPLANTABLE DEVICE N/A 06/04/2016   Procedure: Loop Recorder Insertion;  Surgeon: Justin Grayer, MD;  Location: [facility];   Service: Cardiovascular;  Laterality: N/A;  . INGUINAL HERNIA REPAIR Bilateral 06/16/2017   Procedure: LAPAROSCOPIC BILATERAL INGUINAL HERNIA REPAIR;  Surgeon: Justin Riley, MD;  Location: [facility];  Service: General;  Laterality: Bilateral;  . INSERTION OF MESH Bilateral 06/16/2017   Procedure: INSERTION OF MESH;  Surgeon: Justin Riley, MD;  Location: [facility];  Service: General;  Laterality: Bilateral;  . MASS EXCISION Right 07/28/2018   Procedure: EXCISION OF RIGHT FOREARM MASS;  Surgeon: Justin Riley, MD;  Location: [facility];  Service: General;  Laterality: Right;  . SQUAMOUS CELL CARCINOMA EXCISION    . TEE WITHOUT CARDIOVERSION N/A 06/04/2016   Procedure: TRANSESOPHAGEAL ECHOCARDIOGRAM (TEE);  Surgeon: Justin Casino, MD;  [facility];  Service: Cardiovascular;  Laterality: N/A;    SOCIAL HISTORY: Social History   Socioeconomic History  . Marital status: Married    Spouse name: Not on file  . Number of children: Not on file  . Years of education: Not on file  . Highest education level: Not on file  Occupational History  . Not on file  Tobacco Use  . Smoking status: Never Smoker  . Smokeless tobacco: Never Used  Substance and Sexual Activity  . Alcohol use: No  . Drug use: No  . Sexual activity: Not on file  Other Topics Concern  . Not on file  Social History Narrative  . Not on file   Social Determinants of Health   Financial Resource Strain:   . Difficulty of Paying Living Expenses: Not on file  Food Insecurity:   . Worried About Charity fundraiser in the Last Year: Not on file  . Ran Out of Food in the Last Year: Not on file  Transportation Needs:   . Lack of Transportation (Medical): Not on file  . Lack of Transportation (Non-Medical): Not on file  Physical Activity:   . Days of Exercise per Week: Not on file  . Minutes of Exercise per Session: Not on file  Stress:   . Feeling of Stress : Not on file  Social Connections:   . Frequency of  Communication with Friends and Family: Not on file  . Frequency of Social Gatherings with Friends and Family: Not on file  . Attends Religious Services: Not on file  . Active Member of Clubs or Organizations: Not on file  . Attends Archivist Meetings: Not on file  . Marital Status: Not on file  Intimate Partner Violence:   . Fear of Current or Ex-Partner: Not on file  . Emotionally Abused: Not on file  . Physically Abused: Not on file  . Sexually Abused: Not on file   Justin [name] is retired. His daughter is Mudlogger of  nursing for a nursing home in [place], [place].    FAMILY HISTORY: Family History  Problem Relation Age of Onset  . Stroke Mother   . Hypertension Mother   . Alzheimer's disease Father   . Heart failure Father   . Down syndrome Daughter     ALLERGIES:  is allergic to no known allergies.  MEDICATIONS:  Current Outpatient Medications  Medication Sig Dispense Refill  . atorvastatin (LIPITOR) 40 MG tablet Take 1 tablet (40 mg total) by mouth every morning. 90 tablet 3  . cyanocobalamin 1000 MCG tablet Take 1,000 mcg by mouth daily.    . ergocalciferol (VITAMIN D2) 1.25 MG (50000 UT) capsule Take 1 capsule (50,000 Units total) by mouth once a week. 12 capsule 3  . fluorouracil (EFUDEX) 5 % cream Apply 1 application topically daily as needed (for skin cancer prevention).   0  . glucose monitoring kit (FREESTYLE) monitoring kit 1 each by Does not apply route as needed for other.    . metFORMIN (GLUCOPHAGE) 1000 MG tablet Take 1 tablet (1,000 mg total) by mouth daily with breakfast. 90 tablet 3  . nitroGLYCERIN (NITROSTAT) 0.4 MG SL tablet Place 1 tablet (0.4 mg total) under the tongue every 5 (five) minutes as needed for chest pain (Max 3 doses in 15 mins.). 30 tablet 0  . omeprazole (PRILOSEC) 20 MG capsule Take 20 mg by mouth every morning.     . pregabalin (LYRICA) 75 MG capsule Take 75 mg by mouth 3 (three) times daily.    [name] Kitchen REVLIMID 5 MG capsule Take 1  capsule (5 mg total) by mouth daily. TAKE 1 CAPSULE DAILY FOR 21 DAYS ON, 7 DAYS OFF, REPEAT EVERY 28 DAYS 21 capsule 0  . telmisartan-hydrochlorothiazide (MICARDIS HCT) 80-12.5 MG tablet Take 1 tablet by mouth daily. 90 tablet 3  . XARELTO 20 MG TABS tablet TAKE 1 TABLET DAILY WITH SUPPER 90 tablet 1   No current facility-administered medications for this visit.    REVIEW OF SYSTEMS:   A 10+ POINT REVIEW OF SYSTEMS WAS OBTAINED including neurology, dermatology, psychiatry, cardiac, respiratory, lymph, extremities, GI, GU, Musculoskeletal, constitutional, breasts, reproductive, HEENT.  All pertinent positives are noted in the HPI.  All others are negative.   PHYSICAL EXAMINATION:   ECOG PERFORMANCE STATUS: 2 - Symptomatic, <50% confined to bed Vitals:   02/01/20 1050  BP: 121/71  Pulse: 60  Resp: 18  Temp: 98 F (36.7 C)  SpO2: 100%    Wt Readings from Last 3 Encounters:  02/01/20 187 lb 4.8 oz (85 kg)  12/29/19 185 lb (83.9 kg)  12/29/19 180 lb (81.6 kg)   Body mass index is 28.48 kg/m.   GENERAL:alert, in no acute distress and comfortable SKIN: no acute rashes, no significant lesions EYES: conjunctiva are pink and non-injected, sclera anicteric OROPHARYNX: MMM, no exudates, no oropharyngeal erythema or ulceration NECK: supple, no JVD LYMPH:  no palpable lymphadenopathy in the cervical, axillary or inguinal regions LUNGS: clear to auscultation b/l with normal respiratory effort HEART: regular rate & rhythm ABDOMEN:  normoactive bowel sounds , non tender, not distended. No palpable hepatosplenomegaly.  Extremity: no pedal edema PSYCH: alert & oriented x 3 with fluent speech NEURO: no focal motor/sensory deficits  LABORATORY DATA:  I have reviewed the data as listed. CBC Latest Ref Rng & Units 01/17/2020 11/08/2019 09/06/2019  WBC 4.0 - 10.5 K/uL 3.2(L) 2.4(L) 2.2(L)  Hemoglobin 13.0 - 17.0 g/dL 11.5(L) 10.7(L) 10.3(L)  Hematocrit 39.0 - 52.0 % 35.9(L) 33.1(L)  32.2(L)    Platelets 150 - 400 K/uL 126(L) 137(L) 122(L)  ANC 1k  CMP Latest Ref Rng & Units 01/17/2020 11/08/2019 09/06/2019  Glucose 70 - 99 mg/dL 125(H) 115(H) 124(H)  BUN 8 - 23 mg/dL 28(H) 29(H) 27(H)  Creatinine 0.61 - 1.24 mg/dL 1.32(H) 1.26(H) 1.28(H)  Sodium 135 - 145 mmol/L 142 143 141  Potassium 3.5 - 5.1 mmol/L 4.2 4.7 4.8  Chloride 98 - 111 mmol/L 107 109 107  CO2 22 - 32 mmol/L 26 24 29   Calcium 8.9 - 10.3 mg/dL 9.0 8.9 8.6(L)  Total Protein 6.5 - 8.1 g/dL 6.5 6.5 6.4(L)  Total Bilirubin 0.3 - 1.2 mg/dL 0.7 0.6 0.6  Alkaline Phos 38 - 126 U/L 62 60 52  AST 15 - 41 U/L 19 35 21  ALT 0 - 44 U/L 10 21 12     Lab Results  Component Value Date   IRON 45 06/02/2017   TIBC 233 06/02/2017   IRONPCTSAT 19 (L) 06/02/2017   (Iron and TIBC)  Lab Results  Component Value Date   FERRITIN 308 08/26/2017         RADIOGRAPHIC STUDIES: I have personally reviewed the radiological images as listed and agreed with the findings in the report.       Bone Marrow Biopsy 12/24/2016 (Accession [id number])  Diagnosis Bone Marrow, Aspirate,Biopsy, and Clot, left iliac crest - HYPERCELLULAR BONE MARROW FOR AGE WITH PLASMA CELL NEOPLASM. - SEE COMMENT. PERIPHERAL BLOOD: - NORMOCYTIC-NORMOCHROMIC ANEMIA. - LEUKOPENIA.  DG Bone Survey Met (Accession [id number]) (Order [id number])  Imaging  Date: 11/28/2016 Department: [facility]-RADIOLOGY[facility]STIC Released By: Pricilla Riffle Authorizing: Justin Genera, MD  Exam Information   Status Exam Begun  Exam Ended   Final [99] 11/28/2016 11:59 AM 11/28/2016 12:36 PM  PACS Images   Show images for DG Bone Survey Met  Study Result   CLINICAL DATA:  Monoclonal gammopathy of unknown significance. History of squamous cell carcinoma excision, basal cell carcinoma excision.  EXAM: METASTATIC BONE SURVEY  COMPARISON:  CT neck, chest, abdomen and pelvis from 10/29/16, MRI of the head from 06/01/2016, CXR 10/27/2016,  lumbar spine radiographs 06/24/2016  FINDINGS: Lateral skull: Small occipital lucency which may represent a normal arachnoid granulation posteriorly in the occiput. No definite lytic abnormality.  Cervical spine AP and lateral: Disc space narrowing at C2-3, and from C4 through C7. C4-5, C5-6 and C6-7 uncovertebral joint spurring bilaterally. No lytic abnormality.  Thoracic spine AP and lateral: T8-9 right-sided osteophytes and to a lesser degree T9-10. No lytic abnormality. Slight multilevel lumbar thoracic disc space narrowing likely degenerative.  Lumbar spine AP and lateral: Disc space narrowing at L4-5 and to a greater extent L5-S1. No lytic disease. L3 through S1 facet sclerosis.  AP pelvis: Negative for lytic disease.  Bilateral upper and lower extremities shoulders through wrist and from the hips through ankle: Negative for lytic disease. Joint space narrowing of the femorotibial compartments both knees. Subchondral cyst of the right patella.  CXR: Clear lungs. Cardiac implantable monitoring device projects over the left heart. Aortic atherosclerosis. No lytic disease.  IMPRESSION: No findings suspicious for lytic disease.   Electronically Signed   By: Ashley Royalty M.D.   On: 11/28/2016 14:58     ASSESSMENT & PLAN:   79 y.o. 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 (12/2016) Peak M spike 2.6  Cytogenetics and Myeloma FISH panel.- trisomy  11 Patient has normocytic anemia without any other clear etiology. (>2g/dl lower that lower limit of normal which is 13) which per criteria would place this in the Multiple myeloma category as opposed to Smoldering Multiple myeloma (Borderline criterion)  No overt renal failure or hypercalcemia at this time No focal bone pains though he has significant chronic back pain related to degenerative disc disease. Bone survey shows no overt lytic  lesions.  10/16/18 CT Coronary Morph revealed Coronary artery calcium score 299 Agatston units, this places the patient in the 47th percentile for age and gender, suggesting intermediate risk for future cardiac events. 2. Nonobstructive coronary disease.  12/08/18 Bone Density study revealed that the pt has osteoporosis   07/05/2019 shows an M protein of 0.1.  2) Anemia and thrombocytopenia -- related to treatment (Revlimid) -stable  3) Grade 2 Neuropathy -- in b/l feet and halfway up lower leg without pain -Seconadry to [employer] -improved of [place]  5) Right kidney cyst with nodularity -Reviewed the 01/05/19 US Abdomen which revealed Septated cyst arising from lower pole right kidney with a questionable solid focus within this lesion. This nodular area potentially could represent a small neoplasm developing within this complex cyst. Given this circumstance, pre and post contrast MR of the kidneys is advised to further evaluate. If there is a contraindication to MR, pre and post-contrast CT would be a reasonable alternative to further assess. Study otherwise unremarkable. -Pt will likely need urology referral and MRI, pt would like to discuss this further with his PCP before triggering a referral at this point  6) Borderline low B12 levels, previously 299 --- on replacement -  -05/05/19 Vitamin B12 at 586 -continue replacement.   7) recurrent CVA - thought to be related to small vessel disease as per neurology. Has a small PFO which could be an additional risk factor as well as his afib  8)  P afib Plan -Continue on Xarelto per cardiology.  9) H/o recurrent SCC -Underwent surgical excision on right forearm with Dr. Romana Juniper on 07/28/18. His surgical pathology showed evidence of Squamous Cell Carcinoma. Plan -no evidence of recurrence locally at the site of surgery at this time. -continue dermatology followup  10) Lower extremity discomfort - improved, stable. Likely Discogenic  pain -[place] on 12/2017 revealed no blood clot -Pt has established care with orthopedist and PT.  11) Patient Active Problem List   Diagnosis Date Noted  . Osteoporosis 03/10/2019  . Multiple myeloma without remission ([place]) 03/10/2019  . Chest pain 09/27/2018  . Multiple myeloma ([place]) 09/27/2018  . Degeneration of lumbar intervertebral disc 01/30/2018  . Low back pain 01/30/2018  . Lumbar radiculopathy 01/30/2018  . History of loop recorder 06/12/2017  . Cryptogenic stroke ([place]) 10/28/2016  . Gait abnormality   . History of CVA (cerebrovascular accident)   . History of TIA (transient ischemic attack)   . Benign essential HTN   . Paroxysmal SVT (supraventricular tachycardia) ([place])   . Acute blood loss anemia   . Acute ischemic stroke ([place])   . CVA (cerebral vascular accident) ([place]) 10/26/2016  . 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 History of recent stroke 06/06/2016  . 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 TIA (transient ischemic attack) 06/02/2016  . Numbness 06/01/2016  . Right arm numbness 06/01/2016  . Atherosclerosis of native coronary artery of native heart without angina pectoris 03/20/2016  . Hypertension   . GERD (gastroesophageal reflux disease)   . Gout   . S/P RF ablation operation for  arrhythmia 12/13/2014  . Hyperlipidemia 12/13/2014  . Controlled type 2 diabetes mellitus with complication, without long-term current use of insulin ([place]) 12/07/2014    PLAN: -Discussed pt labwork, 02/01/20; Hgb and WBC has improved, mild thrombocytopenia, blood chemistries are stable -Discussed 01/17/2020 MMP is as follows: all values are WNL. No M Spike observed.  -No lab or clinical evidence of Mulitple Myeloma progression or recurrence at this time -Pt notes Grade 1 diarrhea -Recommend pt use OTC sterile saline spray and/or humidifier at night -Recommend pt f/u as scheduled for second dose of COVID19 vaccine -Advised pt of safety measures to take while traveling in light of the  pandemic -Advised pt that Revlimid increases the risks of blood clots (on Xarelto for afib and prophylaxis) -Recommended that the pt continue to eat well, drink at least 48-64 oz of water each day, and walk 20-30 minutes each day.  -The pt has no prohibitive toxicities from continuing Revlimid at this time -Will continue Revlimid 58m; 3 weeks on, 1 week off  -Continue PO Vitamin D 2x a week. -Continue Prolia shot every 6 months -Recommend pt f/u with Neurologist for medication management -Will see back in 2 months, will labs 2 week prior   FOLLOW UP: RTC with Dr KIrene Limbowith labs in 2 months. Plz schedule labs 2 weeks prior to clinic visit   The total time spent in the appt was 25 minutes and more than 50% was on counseling and direct patient cares.  All of the patient's questions were answered with apparent satisfaction. The patient knows to call the clinic with any problems, questions or concerns.   GSullivan LoneMD MChanhassenAAHIVMS SWestglen Endoscopy CenterCDigestive Disease [facility] LLCHematology/Oncology Physician [facility] (Office):       [phone](Work cell):  [phone](Fax):           [phone] I, Justin [name] am acting as a scribe for Dr. GSullivan Lone   .I have reviewed the above documentation for accuracy and completeness, and I agree with the above. .Justin GeneraMD

## 2020-02-01 ENCOUNTER — Inpatient Hospital Stay: Payer: Medicare Other | Attending: Hematology | Admitting: Hematology

## 2020-02-01 ENCOUNTER — Other Ambulatory Visit: Payer: Self-pay

## 2020-02-01 VITALS — BP 121/71 | HR 60 | Temp 98.0°F | Resp 18 | Ht 68.0 in | Wt 187.3 lb

## 2020-02-01 DIAGNOSIS — C9001 Multiple myeloma in remission: Secondary | ICD-10-CM

## 2020-02-01 DIAGNOSIS — T451X5A Adverse effect of antineoplastic and immunosuppressive drugs, initial encounter: Secondary | ICD-10-CM | POA: Diagnosis not present

## 2020-02-01 DIAGNOSIS — N281 Cyst of kidney, acquired: Secondary | ICD-10-CM | POA: Insufficient documentation

## 2020-02-01 DIAGNOSIS — M810 Age-related osteoporosis without current pathological fracture: Secondary | ICD-10-CM | POA: Diagnosis not present

## 2020-02-01 DIAGNOSIS — Z7901 Long term (current) use of anticoagulants: Secondary | ICD-10-CM | POA: Diagnosis not present

## 2020-02-01 DIAGNOSIS — I639 Cerebral infarction, unspecified: Secondary | ICD-10-CM

## 2020-02-01 DIAGNOSIS — D696 Thrombocytopenia, unspecified: Secondary | ICD-10-CM | POA: Diagnosis not present

## 2020-02-01 DIAGNOSIS — D649 Anemia, unspecified: Secondary | ICD-10-CM | POA: Diagnosis not present

## 2020-02-01 DIAGNOSIS — G620 Drug-induced polyneuropathy: Secondary | ICD-10-CM | POA: Insufficient documentation

## 2020-02-01 DIAGNOSIS — I10 Essential (primary) hypertension: Secondary | ICD-10-CM | POA: Insufficient documentation

## 2020-02-01 DIAGNOSIS — Z8673 Personal history of transient ischemic attack (TIA), and cerebral infarction without residual deficits: Secondary | ICD-10-CM | POA: Insufficient documentation

## 2020-02-01 DIAGNOSIS — E119 Type 2 diabetes mellitus without complications: Secondary | ICD-10-CM | POA: Insufficient documentation

## 2020-02-01 DIAGNOSIS — Z85820 Personal history of malignant melanoma of skin: Secondary | ICD-10-CM | POA: Diagnosis not present

## 2020-02-01 DIAGNOSIS — I480 Paroxysmal atrial fibrillation: Secondary | ICD-10-CM | POA: Diagnosis not present

## 2020-02-01 DIAGNOSIS — C9000 Multiple myeloma not having achieved remission: Secondary | ICD-10-CM | POA: Insufficient documentation

## 2020-02-01 DIAGNOSIS — E538 Deficiency of other specified B group vitamins: Secondary | ICD-10-CM | POA: Diagnosis not present

## 2020-02-22 ENCOUNTER — Ambulatory Visit: Payer: Medicare Other

## 2020-02-22 ENCOUNTER — Ambulatory Visit: Payer: Medicare Other | Attending: Internal Medicine

## 2020-02-22 DIAGNOSIS — Z23 Encounter for immunization: Secondary | ICD-10-CM

## 2020-02-22 NOTE — Progress Notes (Signed)
   Covid-19 Vaccination Clinic  Name:  Justin [name]    MRN: [id number] DOB: 1940/12/28  02/22/2020  [name] was observed post Covid-19 immunization for 15 minutes without incident. He was provided with Vaccine Information Sheet and instruction to access the V-Safe system.   [name] was instructed to call 911 with any severe reactions post vaccine: [name] Kitchen Difficulty breathing  . Swelling of face and throat  . A fast heartbeat  . A bad rash all over body  . Dizziness and weakness   Immunizations Administered    Name Date Dose VIS Date Route   Pfizer COVID-19 Vaccine 02/22/2020 11:43 AM 0.3 mL 12/03/2019 Intramuscular   Manufacturer: [place]   Lot: [id number]   [place]: [id number]

## 2020-02-25 ENCOUNTER — Other Ambulatory Visit: Payer: Self-pay | Admitting: Hematology

## 2020-02-25 DIAGNOSIS — C9001 Multiple myeloma in remission: Secondary | ICD-10-CM

## 2020-02-28 ENCOUNTER — Telehealth: Payer: Self-pay | Admitting: Hematology

## 2020-02-28 ENCOUNTER — Encounter: Payer: Medicare Other | Admitting: Physician Assistant

## 2020-02-28 ENCOUNTER — Telehealth: Payer: Self-pay

## 2020-02-28 NOTE — Telephone Encounter (Signed)
Received call from pt. concerning his Revlimid medication, informed him that this was sent to his pharmacy on 03/05 and is ready for pick up.  Patient also requested his upcoming appts. be rescheduled from 04/01 Lab & 04/15 MD visit to 04/15 Lab & 04/29 MD visit. Dr. Irene Limbo was agreeable with this.  Scheduling message sent.

## 2020-02-28 NOTE — Telephone Encounter (Signed)
R/s appt per 3/8 sch message - pt aware of new appt date and time

## 2020-03-11 ENCOUNTER — Other Ambulatory Visit: Payer: Self-pay | Admitting: Physician Assistant

## 2020-03-13 NOTE — Telephone Encounter (Signed)
Prescription refill request for Xarelto received.   Last office visit: [place], 12/29/2019 Weight: 85 kg Age: 79 y.o. Scr: 1.32, 01/17/2020 CrCl: 53.66 ml/min   Prescription refill sent.

## 2020-03-23 ENCOUNTER — Other Ambulatory Visit: Payer: Medicare Other

## 2020-04-05 ENCOUNTER — Other Ambulatory Visit: Payer: Self-pay | Admitting: *Deleted

## 2020-04-05 DIAGNOSIS — C9001 Multiple myeloma in remission: Secondary | ICD-10-CM

## 2020-04-05 MED ORDER — REVLIMID 5 MG PO CAPS: ORAL_CAPSULE | ORAL | 0 refills | Status: DC

## 2020-04-05 NOTE — Telephone Encounter (Signed)
Patient called and requested refill of Revlimid Revlimid Rx escribed to [facility], [place] # [id number], 04/05/20

## 2020-04-06 ENCOUNTER — Inpatient Hospital Stay: Payer: Medicare Other | Attending: Hematology

## 2020-04-06 ENCOUNTER — Ambulatory Visit: Payer: Medicare Other | Admitting: Hematology

## 2020-04-06 ENCOUNTER — Other Ambulatory Visit: Payer: Self-pay

## 2020-04-06 DIAGNOSIS — G620 Drug-induced polyneuropathy: Secondary | ICD-10-CM | POA: Diagnosis not present

## 2020-04-06 DIAGNOSIS — M79606 Pain in leg, unspecified: Secondary | ICD-10-CM | POA: Insufficient documentation

## 2020-04-06 DIAGNOSIS — T451X5A Adverse effect of antineoplastic and immunosuppressive drugs, initial encounter: Secondary | ICD-10-CM | POA: Diagnosis not present

## 2020-04-06 DIAGNOSIS — I480 Paroxysmal atrial fibrillation: Secondary | ICD-10-CM | POA: Insufficient documentation

## 2020-04-06 DIAGNOSIS — N281 Cyst of kidney, acquired: Secondary | ICD-10-CM | POA: Insufficient documentation

## 2020-04-06 DIAGNOSIS — D6959 Other secondary thrombocytopenia: Secondary | ICD-10-CM | POA: Diagnosis not present

## 2020-04-06 DIAGNOSIS — Z7901 Long term (current) use of anticoagulants: Secondary | ICD-10-CM | POA: Insufficient documentation

## 2020-04-06 DIAGNOSIS — C9000 Multiple myeloma not having achieved remission: Secondary | ICD-10-CM | POA: Diagnosis not present

## 2020-04-06 DIAGNOSIS — E538 Deficiency of other specified B group vitamins: Secondary | ICD-10-CM | POA: Diagnosis not present

## 2020-04-06 DIAGNOSIS — Z85820 Personal history of malignant melanoma of skin: Secondary | ICD-10-CM | POA: Insufficient documentation

## 2020-04-06 DIAGNOSIS — Z8673 Personal history of transient ischemic attack (TIA), and cerebral infarction without residual deficits: Secondary | ICD-10-CM | POA: Diagnosis not present

## 2020-04-06 DIAGNOSIS — C9001 Multiple myeloma in remission: Secondary | ICD-10-CM

## 2020-04-06 DIAGNOSIS — M810 Age-related osteoporosis without current pathological fracture: Secondary | ICD-10-CM | POA: Diagnosis not present

## 2020-04-06 LAB — CBC WITH DIFFERENTIAL/PLATELET
Abs Immature Granulocytes: 0 10*3/uL (ref 0.00–0.07)
Basophils Absolute: 0 10*3/uL (ref 0.0–0.1)
Basophils Relative: 2 %
Eosinophils Absolute: 0.2 10*3/uL (ref 0.0–0.5)
Eosinophils Relative: 7 %
HCT: 34.7 % — ABNORMAL LOW (ref 39.0–52.0)
Hemoglobin: 11.1 g/dL — ABNORMAL LOW (ref 13.0–17.0)
Immature Granulocytes: 0 %
Lymphocytes Relative: 32 %
Lymphs Abs: 0.9 10*3/uL (ref 0.7–4.0)
MCH: 29.5 pg (ref 26.0–34.0)
MCHC: 32 g/dL (ref 30.0–36.0)
MCV: 92.3 fL (ref 80.0–100.0)
Monocytes Absolute: 0.3 10*3/uL (ref 0.1–1.0)
Monocytes Relative: 11 %
Neutro Abs: 1.3 10*3/uL — ABNORMAL LOW (ref 1.7–7.7)
Neutrophils Relative %: 48 %
Platelets: 119 10*3/uL — ABNORMAL LOW (ref 150–400)
RBC: 3.76 MIL/uL — ABNORMAL LOW (ref 4.22–5.81)
RDW: 14.7 % (ref 11.5–15.5)
WBC: 2.7 10*3/uL — ABNORMAL LOW (ref 4.0–10.5)
nRBC: 0 % (ref 0.0–0.2)

## 2020-04-06 LAB — CMP (CANCER CENTER ONLY)
ALT: 14 U/L (ref 0–44)
AST: 22 U/L (ref 15–41)
Albumin: 3.7 g/dL (ref 3.5–5.0)
Alkaline Phosphatase: 61 U/L (ref 38–126)
Anion gap: 9 (ref 5–15)
BUN: 33 mg/dL — ABNORMAL HIGH (ref 8–23)
CO2: 23 mmol/L (ref 22–32)
Calcium: 8.7 mg/dL — ABNORMAL LOW (ref 8.9–10.3)
Chloride: 108 mmol/L (ref 98–111)
Creatinine: 1.55 mg/dL — ABNORMAL HIGH (ref 0.61–1.24)
GFR, Est AFR Am: 48 mL/min — ABNORMAL LOW (ref >60)
GFR, Estimated: 42 mL/min — ABNORMAL LOW (ref >60)
Glucose, Bld: 112 mg/dL — ABNORMAL HIGH (ref 70–99)
Potassium: 4.3 mmol/L (ref 3.5–5.1)
Sodium: 140 mmol/L (ref 135–145)
Total Bilirubin: 0.7 mg/dL (ref 0.3–1.2)
Total Protein: 6.7 g/dL (ref 6.5–8.1)

## 2020-04-06 LAB — VITAMIN B12: Vitamin B-12: 736 pg/mL (ref 180–914)

## 2020-04-07 ENCOUNTER — Other Ambulatory Visit: Payer: Self-pay | Admitting: Hematology

## 2020-04-10 ENCOUNTER — Telehealth: Payer: Self-pay

## 2020-04-10 LAB — MULTIPLE MYELOMA PANEL, SERUM
Albumin SerPl Elph-Mcnc: 3.7 g/dL (ref 2.9–4.4)
Albumin/Glob SerPl: 1.4 (ref 0.7–1.7)
Alpha 1: 0.2 g/dL (ref 0.0–0.4)
Alpha2 Glob SerPl Elph-Mcnc: 0.7 g/dL (ref 0.4–1.0)
B-Globulin SerPl Elph-Mcnc: 0.9 g/dL (ref 0.7–1.3)
Gamma Glob SerPl Elph-Mcnc: 0.9 g/dL (ref 0.4–1.8)
Globulin, Total: 2.7 g/dL (ref 2.2–3.9)
IgA: 137 mg/dL (ref 61–437)
IgG (Immunoglobin G), Serum: 944 mg/dL (ref 603–1613)
IgM (Immunoglobulin M), Srm: 20 mg/dL (ref 15–143)
Total Protein ELP: 6.4 g/dL (ref 6.0–8.5)

## 2020-04-10 NOTE — Telephone Encounter (Signed)
Received call from pt. to confirm his next appt. with Dr. Irene Limbo; informed him appt is on 04/20/20 at 12 noon. He also wanted to know a better way to refill his Revlimid, advised pt. to call office or send a MyChart message when he starts his 7 days off, that way refill can be processed and ready before his next 21 days on. Pt. was agreeable with this and verbalized understanding.

## 2020-04-12 DIAGNOSIS — I10 Essential (primary) hypertension: Secondary | ICD-10-CM | POA: Diagnosis not present

## 2020-04-12 DIAGNOSIS — E1169 Type 2 diabetes mellitus with other specified complication: Secondary | ICD-10-CM | POA: Diagnosis not present

## 2020-04-12 DIAGNOSIS — D692 Other nonthrombocytopenic purpura: Secondary | ICD-10-CM | POA: Diagnosis not present

## 2020-04-12 DIAGNOSIS — I4891 Unspecified atrial fibrillation: Secondary | ICD-10-CM | POA: Diagnosis not present

## 2020-04-12 DIAGNOSIS — E7800 Pure hypercholesterolemia, unspecified: Secondary | ICD-10-CM | POA: Diagnosis not present

## 2020-04-12 DIAGNOSIS — C9000 Multiple myeloma not having achieved remission: Secondary | ICD-10-CM | POA: Diagnosis not present

## 2020-04-19 NOTE — Progress Notes (Signed)
HEMATOLOGY/ONCOLOGY CLINIC NOTE  Date of Service: 04/20/2020   Patient Care Team: Mayra Neer, MD as PCP - General (Family Medicine) Debara Pickett Nadean Corwin, MD as PCP - Cardiology (Cardiology)   CHIEF COMPLAINTS/:  F/u for continued mx of Myeloma   HISTORY OF PRESENTING ILLNESS:  Justin [name] is a wonderful 79 y.o. 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  Patient has a history of hypertension, diabetes, GERD, gout, squamous cell skin cancers, elevated PSA, SVT status post ablation in May 2016, chronic back pain who has a history of recurrent CVA/TIA's. He apparently had a left hemispheric TIA June 2017 and has had recurrent) subcortical infarcts thought to be related to small vessel disease. He was noted to have a small PFO that was of questionable significance. He was initially on aspirin and then continued and aspirin + plavix for secondary stroke prevention. He follows with Dr. Antony Contras for his neurology cares.  An SPEP was done due to elevated total proteins of 8.9 and showed an M spike of 2 g/dL. He was also noted to have an elevated total protein of 8.6 in April 2017. He was referred to [place] for further evaluation of his M spike. Blood test did not reveal any hypercalcemia, no significant renal failure. He has been noted to have developed anemia since June the serum and his hemoglobin was 12.6 and is now down to the mid 10 range. He notes no focal bone pains at this time. Overt acute weight loss. No fevers no chills no night sweats.   CURRENT THERAPY:   Revlimid maintenance 59m po 3 weeks on 1 week off.   INTERVAL HISTORY   WDirck Butchwas seen today for follow-up of his myeloma. The patient's last visit with [name] on 02/01/20. The pt reports that he is doing well overall.   The pt reports he is great. Pt has gotten both doses of COVID19 vaccine. He has had diarrhea due to revlimid. Pt has been  tolerating revlimid well. His legs are not as strong as they use to be. Pt has had falls in the last 3 months while on uneven surfaces. He has had cramps in legs about once a week. Pt is taking magnesium, Vitamin D, iron and Vitamin C. He ha not had any dental issues.   Lab results today (04/06/20) of CBC w/diff and CMP is as follows: all values are WNL except for WBC at 2.7K, RBC at 3.76, Hemoglobin at 11.1, HCT at 34.7, Platelets at 119K, Neutro Abs at 1.3K, Glucose at 112 BUN at 33, Creatinine at 1.55, Calcium at 8.7, GFR, Est Non Af Am at 42, GFR, Est AFR Am at 48 04/06/20 of MMP is as follows: all values are WNL  04/06/20 of Vitamin B12 at 736: WNL  On review of systems, pt reports diarrhea, cramps in legs and denies fatigue, back pain and any other symptoms.   MEDICAL HISTORY:  Past Medical History:  Diagnosis Date  . Anemia   . Arthritis   . 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 Diabetes mellitus without complication ([place]   . GERD (gastroesophageal reflux disease)   . Gout   . History of loop recorder   . Hypertension   . Left leg weakness   . Multiple myeloma ([place] 09/27/2018  . Paroxysmal SVT (supraventricular tachycardia) (HCC)    a. s/p RFCA  on 05/01/15  . Stroke [facility]) 2017   TIA and mild stroke; denies stroke symptoms since then     SURGICAL HISTORY: Past Surgical History:  Procedure Laterality Date  . ATRIAL FIBRILLATION ABLATION     Dr. Lovena Le  . BASAL CELL CARCINOMA EXCISION     off of back  . ELECTROPHYSIOLOGIC STUDY N/A 05/01/2015   Procedure: SVT Ablation;  Surgeon: Evans Lance, MD;  Location: [place] [facility];  Service: Cardiovascular;  Laterality: N/A;  . EP IMPLANTABLE DEVICE N/A 06/04/2016   Procedure: Loop Recorder Insertion;  Surgeon: Thompson Grayer, MD;  Location: [place] [facility];  Service: Cardiovascular;  Laterality: N/A;  . INGUINAL HERNIA REPAIR Bilateral 06/16/2017   Procedure: LAPAROSCOPIC BILATERAL INGUINAL  HERNIA REPAIR;  Surgeon: Clovis Riley, MD;  Location: [place];  Service: General;  Laterality: Bilateral;  . INSERTION OF MESH Bilateral 06/16/2017   Procedure: INSERTION OF MESH;  Surgeon: Clovis Riley, MD;  Location: [place];  Service: General;  Laterality: Bilateral;  . MASS EXCISION Right 07/28/2018   Procedure: EXCISION OF RIGHT FOREARM MASS;  Surgeon: Clovis Riley, MD;  Location: [facility];  Service: General;  Laterality: Right;  . SQUAMOUS CELL CARCINOMA EXCISION    . TEE WITHOUT CARDIOVERSION N/A 06/04/2016   Procedure: TRANSESOPHAGEAL ECHOCARDIOGRAM (TEE);  Surgeon: Pixie Casino, MD;  [facility];  Service: Cardiovascular;  Laterality: N/A;    SOCIAL HISTORY: Social History   Socioeconomic History  . Marital status: Married    Spouse name: Not on file  . Number of children: Not on file  . Years of education: Not on file  . Highest education level: Not on file  Occupational History  . Not on file  Tobacco Use  . Smoking status: Never Smoker  . Smokeless tobacco: Never Used  Substance and Sexual Activity  . Alcohol use: No  . Drug use: No  . Sexual activity: Not on file  Other Topics Concern  . Not on file  Social History Narrative  . Not on file   Social Determinants of Health   Financial Resource Strain:   . Difficulty of Paying Living Expenses:   Food Insecurity:   . Worried About Charity fundraiser in the Last Year:   . Arboriculturist in the Last Year:   Transportation Needs:   . Film/video editor (Medical):   [name] Kitchen Lack of Transportation (Non-Medical):   Physical Activity:   . Days of Exercise per Week:   . Minutes of Exercise per Session:   Stress:   . Feeling of Stress :   Social Connections:   . Frequency of Communication with Friends and Family:   . Frequency of Social Gatherings with Friends and Family:   . Attends Religious Services:   . Active Member of Clubs or Organizations:   . Attends Archivist Meetings:   [name] Kitchen  Marital Status:   Intimate Partner Violence:   . Fear of Current or Ex-Partner:   . Emotionally Abused:   [name] Kitchen Physically Abused:   . Sexually Abused:    Chael Urenda is retired. His daughter is Mudlogger of nursing for a nursing home in [place], [place].    FAMILY HISTORY: Family History  Problem Relation Age of Onset  . Stroke Mother   . Hypertension Mother   . Alzheimer's disease Father   . Heart failure Father   . Down syndrome Daughter     ALLERGIES:  is allergic to no  known allergies.  MEDICATIONS:  Current Outpatient Medications  Medication Sig Dispense Refill  . atorvastatin (LIPITOR) 40 MG tablet Take 1 tablet (40 mg total) by mouth every morning. 90 tablet 3  . cyanocobalamin 1000 MCG tablet Take 1,000 mcg by mouth daily.    . fluorouracil (EFUDEX) 5 % cream Apply 1 application topically daily as needed (for skin cancer prevention).   0  . glucose monitoring kit (FREESTYLE) monitoring kit 1 each by Does not apply route as needed for other.    . metFORMIN (GLUCOPHAGE) 1000 MG tablet Take 1 tablet (1,000 mg total) by mouth daily with breakfast. 90 tablet 3  . nitroGLYCERIN (NITROSTAT) 0.4 MG SL tablet Place 1 tablet (0.4 mg total) under the tongue every 5 (five) minutes as needed for chest pain (Max 3 doses in 15 mins.). 30 tablet 0  . omeprazole (PRILOSEC) 20 MG capsule Take 20 mg by mouth every morning.     . pregabalin (LYRICA) 75 MG capsule Take 75 mg by mouth 3 (three) times daily.    [name] Kitchen REVLIMID 5 MG capsule TAKE 1 CAPSULE (5 MG) DAILY FOR 21 DAYS ON, 7 DAYS OFF, REPEAT EVERY 28 DAYS 21 capsule 0  . telmisartan-hydrochlorothiazide (MICARDIS HCT) 80-12.5 MG tablet Take 1 tablet by mouth daily. 90 tablet 3  . Vitamin D, Ergocalciferol, (DRISDOL) 1.25 MG (50000 UNIT) CAPS capsule TAKE 1 CAPSULE BY MOUTH 1 TIME A WEEK 12 capsule 3  . XARELTO 20 MG TABS tablet TAKE 1 TABLET DAILY WITH SUPPER 90 tablet 1   No current facility-administered medications for this visit.    REVIEW OF  SYSTEMS:   A 10+ POINT REVIEW OF SYSTEMS WAS OBTAINED including neurology, dermatology, psychiatry, cardiac, respiratory, lymph, extremities, GI, GU, Musculoskeletal, constitutional, breasts, reproductive, HEENT.  All pertinent positives are noted in the HPI.  All others are negative.   PHYSICAL EXAMINATION:   ECOG PERFORMANCE STATUS: 2 - Symptomatic, <50% confined to bed Vitals:   04/20/20 1131  BP: (!) 105/57  Pulse: 60  Resp: 17  Temp: 98 F (36.7 C)  SpO2: 98%    Wt Readings from Last 3 Encounters:  04/20/20 187 lb (84.8 kg)  02/01/20 187 lb 4.8 oz (85 kg)  12/29/19 185 lb (83.9 kg)   Body mass index is 28.43 kg/m.   GENERAL:alert, in no acute distress and comfortable SKIN: no acute rashes, no significant lesions EYES: conjunctiva are pink and non-injected, sclera anicteric OROPHARYNX: MMM, no exudates, no oropharyngeal erythema or ulceration NECK: supple, no JVD LYMPH:  no palpable lymphadenopathy in the cervical, axillary or inguinal regions LUNGS: clear to auscultation b/l with normal respiratory effort HEART: regular rate & rhythm ABDOMEN:  normoactive bowel sounds , non tender, not distended. Extremity: no pedal edema PSYCH: alert & oriented x 3 with fluent speech NEURO: no focal motor/sensory deficits  LABORATORY DATA:  I have reviewed the data as listed. CBC Latest Ref Rng & Units 04/06/2020 01/17/2020 11/08/2019  WBC 4.0 - 10.5 K/uL 2.7(L) 3.2(L) 2.4(L)  Hemoglobin 13.0 - 17.0 g/dL 11.1(L) 11.5(L) 10.7(L)  Hematocrit 39.0 - 52.0 % 34.7(L) 35.9(L) 33.1(L)  Platelets 150 - 400 K/uL 119(L) 126(L) 137(L)  ANC 1k  CMP Latest Ref Rng & Units 04/06/2020 01/17/2020 11/08/2019  Glucose 70 - 99 mg/dL 112(H) 125(H) 115(H)  BUN 8 - 23 mg/dL 33(H) 28(H) 29(H)  Creatinine 0.61 - 1.24 mg/dL 1.55(H) 1.32(H) 1.26(H)  Sodium 135 - 145 mmol/L 140 142 143  Potassium 3.5 - 5.1 mmol/L 4.3 4.2 4.7  Chloride 98 - 111 mmol/L 108 107 109  CO2 22 - 32 mmol/L 23 26 24   Calcium 8.9 -  10.3 mg/dL 8.7(L) 9.0 8.9  Total Protein 6.5 - 8.1 g/dL 6.7 6.5 6.5  Total Bilirubin 0.3 - 1.2 mg/dL 0.7 0.7 0.6  Alkaline Phos 38 - 126 U/L 61 62 60  AST 15 - 41 U/L 22 19 35  ALT 0 - 44 U/L 14 10 21     Lab Results  Component Value Date   IRON 45 06/02/2017   TIBC 233 06/02/2017   IRONPCTSAT 19 (L) 06/02/2017   (Iron and TIBC)  Lab Results  Component Value Date   FERRITIN 308 08/26/2017         RADIOGRAPHIC STUDIES: I have personally reviewed the radiological images as listed and agreed with the findings in the report.       Bone Marrow Biopsy 12/24/2016 (Accession [id number])  Diagnosis Bone Marrow, Aspirate,Biopsy, and Clot, left iliac crest - HYPERCELLULAR BONE MARROW FOR AGE WITH PLASMA CELL NEOPLASM. - SEE COMMENT. PERIPHERAL BLOOD: - NORMOCYTIC-NORMOCHROMIC ANEMIA. - LEUKOPENIA.  DG Bone Survey Met (Accession [id number]) (Order [id number])  Imaging  Date: 11/28/2016 Department: [facility] Released By: Pricilla Riffle Authorizing: Brunetta Genera, MD  Exam Information   Status Exam Begun  Exam Ended   Final [99] 11/28/2016 11:59 AM 11/28/2016 12:36 PM  PACS Images   Show images for DG Bone Survey Met  Study Result   CLINICAL DATA:  Monoclonal gammopathy of unknown significance. History of squamous cell carcinoma excision, basal cell carcinoma excision.  EXAM: METASTATIC BONE SURVEY  COMPARISON:  CT neck, chest, abdomen and pelvis from 10/29/16, MRI of the head from 06/01/2016, CXR 10/27/2016, lumbar spine radiographs 06/24/2016  FINDINGS: Lateral skull: Small occipital lucency which may represent a normal arachnoid granulation posteriorly in the occiput. No definite lytic abnormality.  Cervical spine AP and lateral: Disc space narrowing at C2-3, and from C4 through C7. C4-5, C5-6 and C6-7 uncovertebral joint spurring bilaterally. No lytic abnormality.  Thoracic spine AP and lateral: T8-9  right-sided osteophytes and to a lesser degree T9-10. No lytic abnormality. Slight multilevel lumbar thoracic disc space narrowing likely degenerative.  Lumbar spine AP and lateral: Disc space narrowing at L4-5 and to a greater extent L5-S1. No lytic disease. L3 through S1 facet sclerosis.  AP pelvis: Negative for lytic disease.  Bilateral upper and lower extremities shoulders through wrist and from the hips through ankle: Negative for lytic disease. Joint space narrowing of the femorotibial compartments both knees. Subchondral cyst of the right patella.  CXR: Clear lungs. Cardiac implantable monitoring device projects over the left heart. Aortic atherosclerosis. No lytic disease.  IMPRESSION: No findings suspicious for lytic disease.   Electronically Signed   By: Ashley Royalty M.D.   On: 11/28/2016 14:58     ASSESSMENT & PLAN:   79 y.o. 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 (12/2016) Peak M spike 2.6  Cytogenetics and Myeloma FISH panel.- trisomy 11 Patient has normocytic anemia without any other clear etiology. (>2g/dl lower that lower limit of normal which is 13) which per criteria would place this in the Multiple myeloma category as opposed to Smoldering Multiple myeloma (Borderline criterion)  No overt renal failure or hypercalcemia at this time No focal bone pains though he has significant chronic back pain related to degenerative disc disease. Bone survey shows no overt  lytic lesions.  10/16/18 CT Coronary Morph revealed Coronary artery calcium score 299 Agatston units, this places the patient in the 47th percentile for age and gender, suggesting intermediate risk for future cardiac events. 2. Nonobstructive coronary disease.  12/08/18 Bone Density study revealed that the pt has osteoporosis   07/05/2019 shows an M protein of 0.1.  2) Anemia and thrombocytopenia -- related to  treatment (Revlimid) -stable  3) Grade 2 Neuropathy -- in b/l feet and halfway up lower leg without pain -Seconadry to [employer] -improved off [place]  5) Right kidney cyst with nodularity -Reviewed the 01/05/19 US Abdomen which revealed Septated cyst arising from lower pole right kidney with a questionable solid focus within this lesion. This nodular area potentially could represent a small neoplasm developing within this complex cyst. Given this circumstance, pre and post contrast MR of the kidneys is advised to further evaluate. If there is a contraindication to MR, pre and post-contrast CT would be a reasonable alternative to further assess. Study otherwise unremarkable. -Pt will likely need urology referral and MRI, pt would like to discuss this further with his PCP before triggering a referral at this point  6) Borderline low B12 levels, previously 299 --- on replacement -  -05/05/19 Vitamin B12 at 586 -continue replacement.   7) recurrent CVA - thought to be related to small vessel disease as per neurology. Has a small PFO which could be an additional risk factor as well as his afib  8)  P afib Plan -Continue on Xarelto per cardiology.  9) H/o recurrent SCC -Underwent surgical excision on right forearm with Dr. Romana Juniper on 07/28/18. His surgical pathology showed evidence of Squamous Cell Carcinoma. Plan -no evidence of recurrence locally at the site of surgery at this time. -continue dermatology followup  10) Lower extremity discomfort - improved, stable. Likely Discogenic pain -[place] on 12/2017 revealed no blood clot -Pt has established care with orthopedist and PT.  11) Patient Active Problem List   Diagnosis Date Noted  . Osteoporosis 03/10/2019  . Multiple myeloma without remission ([place]) 03/10/2019  . Chest pain 09/27/2018  . Multiple myeloma ([place]) 09/27/2018  . Degeneration of lumbar intervertebral disc 01/30/2018  . Low back pain 01/30/2018  . Lumbar radiculopathy  01/30/2018  . History of loop recorder 06/12/2017  . Cryptogenic stroke ([place]) 10/28/2016  . Gait abnormality   . History of CVA (cerebrovascular accident)   . History of TIA (transient ischemic attack)   . Benign essential HTN   . Paroxysmal SVT (supraventricular tachycardia) ([place])   . Acute blood loss anemia   . Acute ischemic stroke ([place])   . CVA (cerebral vascular accident) ([place]) 10/26/2016  . 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 History of recent stroke 06/06/2016  . 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 TIA (transient ischemic attack) 06/02/2016  . Numbness 06/01/2016  . Right arm numbness 06/01/2016  . Atherosclerosis of native coronary artery of native heart without angina pectoris 03/20/2016  . Hypertension   . GERD (gastroesophageal reflux disease)   . Gout   . S/P RF ablation operation for arrhythmia 12/13/2014  . Hyperlipidemia 12/13/2014  . Controlled type 2 diabetes mellitus with complication, without long-term current use of insulin ([place]) 12/07/2014    PLAN: -Discussed pt labwork today, 04/06/20; of CBC w/diff and CMP is as follows: all values are WNL except for WBC at 2.7K, RBC at 3.76, Hemoglobin at 11.1, HCT at 34.7, Platelets at 119K, Neutro Abs at 1.3K, Glucose at 112 BUN at  33, Creatinine at 1.55, Calcium at 8.7, GFR, Est Non Af Am at 42, GFR, Est AFR Am at 48 -Discussed 04/06/20 of MMP is as follows: all values are WNL  -Discussed 04/06/20 of Vitamin B12 at 736: WNL -No lab or clinical evidence of Mulitple Myeloma progression or recurrence at this time -The pt has no prohibitive toxicities from continuing Revlimid at this time -M protein is still undetectable  -Pt notes Grade 1 diarrhea -Advised drinking water and staying hydrated and getting electrolytes  -Advised Revlimid is known to cause cramps -Advised walking, warm compress and massage to help with cramping, engage contrary muscle group to cramping muscles  -Advised on getting light weight cane or walking  stick for uneven surfaces  -Recommended that the pt continue to eat well, drink at least 48-64 oz of water each day, and walk 20-30 minutes each day. -Will continue Revlimid 39m; 3 weeks on, 1 week off  -Schedule Prolia shot in 1 month -f/u with Dentist  -Will see back in 10 weeks with labs 1 week prior   FOLLOW UP: PLZ schedule next dose of Prolia in 4 weeks and then every 6 months x 2 RTC with Dr KIrene Limboin 10 weeks (labs in 9 weeks - that is 1 week prior to clinic visit)  The total time spent in the appt was 30 minutes and more than 50% was on counseling and direct patient cares.  All of the patient's questions were answered with apparent satisfaction. The patient knows to call the clinic with any problems, questions or concerns.  GSullivan LoneMD MPine Mountain LakeAAHIVMS SMuscogee (Creek) Nation Physical Rehabilitation CenterCHhc [facility] Surgery Center LLCHematology/Oncology Physician CEye [facility] (Office):       [phone](Work cell):  [phone](Fax):           [phone] I, EDawayne Cirriam acting as a scribe for Dr. GSullivan Lone   .I have reviewed the above documentation for accuracy and completeness, and I agree with the above. .Brunetta GeneraMD

## 2020-04-20 ENCOUNTER — Other Ambulatory Visit: Payer: Self-pay

## 2020-04-20 ENCOUNTER — Inpatient Hospital Stay (HOSPITAL_BASED_OUTPATIENT_CLINIC_OR_DEPARTMENT_OTHER): Payer: Medicare Other | Admitting: Hematology

## 2020-04-20 VITALS — BP 105/57 | HR 60 | Temp 98.0°F | Resp 17 | Ht 68.0 in | Wt 187.0 lb

## 2020-04-20 DIAGNOSIS — E538 Deficiency of other specified B group vitamins: Secondary | ICD-10-CM | POA: Diagnosis not present

## 2020-04-20 DIAGNOSIS — G620 Drug-induced polyneuropathy: Secondary | ICD-10-CM | POA: Diagnosis not present

## 2020-04-20 DIAGNOSIS — M810 Age-related osteoporosis without current pathological fracture: Secondary | ICD-10-CM | POA: Diagnosis not present

## 2020-04-20 DIAGNOSIS — I639 Cerebral infarction, unspecified: Secondary | ICD-10-CM | POA: Diagnosis not present

## 2020-04-20 DIAGNOSIS — C9001 Multiple myeloma in remission: Secondary | ICD-10-CM

## 2020-04-20 DIAGNOSIS — D6959 Other secondary thrombocytopenia: Secondary | ICD-10-CM | POA: Diagnosis not present

## 2020-04-20 DIAGNOSIS — I480 Paroxysmal atrial fibrillation: Secondary | ICD-10-CM | POA: Diagnosis not present

## 2020-04-20 DIAGNOSIS — C9000 Multiple myeloma not having achieved remission: Secondary | ICD-10-CM | POA: Diagnosis not present

## 2020-04-21 ENCOUNTER — Telehealth: Payer: Self-pay | Admitting: Hematology

## 2020-04-21 NOTE — Telephone Encounter (Signed)
Scheduled per 04/29 los, patient has been called and notified.

## 2020-04-27 DIAGNOSIS — N179 Acute kidney failure, unspecified: Secondary | ICD-10-CM | POA: Diagnosis not present

## 2020-04-29 ENCOUNTER — Other Ambulatory Visit: Payer: Self-pay | Admitting: Hematology

## 2020-04-29 DIAGNOSIS — C9001 Multiple myeloma in remission: Secondary | ICD-10-CM

## 2020-05-01 NOTE — Telephone Encounter (Signed)
Revlimid refilled per Dr.Kale's OV note 04/20/20 [employer] Auth # [id number]

## 2020-05-08 DIAGNOSIS — N179 Acute kidney failure, unspecified: Secondary | ICD-10-CM | POA: Diagnosis not present

## 2020-05-12 ENCOUNTER — Other Ambulatory Visit: Payer: Self-pay | Admitting: *Deleted

## 2020-05-12 ENCOUNTER — Telehealth: Payer: Self-pay | Admitting: Hematology

## 2020-05-12 DIAGNOSIS — C9001 Multiple myeloma in remission: Secondary | ICD-10-CM

## 2020-05-12 NOTE — Telephone Encounter (Signed)
Scheduled appt per 5/21 sch message- pt is aware of appt  

## 2020-05-18 ENCOUNTER — Other Ambulatory Visit: Payer: Self-pay

## 2020-05-18 ENCOUNTER — Inpatient Hospital Stay: Payer: Medicare Other | Attending: Hematology

## 2020-05-18 ENCOUNTER — Inpatient Hospital Stay: Payer: Medicare Other

## 2020-05-18 VITALS — BP 110/58 | Temp 98.2°F | Resp 18

## 2020-05-18 DIAGNOSIS — C9000 Multiple myeloma not having achieved remission: Secondary | ICD-10-CM

## 2020-05-18 DIAGNOSIS — C9001 Multiple myeloma in remission: Secondary | ICD-10-CM

## 2020-05-18 DIAGNOSIS — M818 Other osteoporosis without current pathological fracture: Secondary | ICD-10-CM

## 2020-05-18 LAB — CBC WITH DIFFERENTIAL (CANCER CENTER ONLY)
Abs Immature Granulocytes: 0 10*3/uL (ref 0.00–0.07)
Basophils Absolute: 0 10*3/uL (ref 0.0–0.1)
Basophils Relative: 1 %
Eosinophils Absolute: 0.1 10*3/uL (ref 0.0–0.5)
Eosinophils Relative: 6 %
HCT: 32.3 % — ABNORMAL LOW (ref 39.0–52.0)
Hemoglobin: 10.6 g/dL — ABNORMAL LOW (ref 13.0–17.0)
Immature Granulocytes: 0 %
Lymphocytes Relative: 30 %
Lymphs Abs: 0.6 10*3/uL — ABNORMAL LOW (ref 0.7–4.0)
MCH: 30.5 pg (ref 26.0–34.0)
MCHC: 32.8 g/dL (ref 30.0–36.0)
MCV: 92.8 fL (ref 80.0–100.0)
Monocytes Absolute: 0.3 10*3/uL (ref 0.1–1.0)
Monocytes Relative: 16 %
Neutro Abs: 1.1 10*3/uL — ABNORMAL LOW (ref 1.7–7.7)
Neutrophils Relative %: 47 %
Platelet Count: 107 10*3/uL — ABNORMAL LOW (ref 150–400)
RBC: 3.48 MIL/uL — ABNORMAL LOW (ref 4.22–5.81)
RDW: 14.7 % (ref 11.5–15.5)
WBC Count: 2.2 10*3/uL — ABNORMAL LOW (ref 4.0–10.5)
nRBC: 0 % (ref 0.0–0.2)

## 2020-05-18 LAB — CMP (CANCER CENTER ONLY)
ALT: 16 U/L (ref 0–44)
AST: 32 U/L (ref 15–41)
Albumin: 3.7 g/dL (ref 3.5–5.0)
Alkaline Phosphatase: 60 U/L (ref 38–126)
Anion gap: 7 (ref 5–15)
BUN: 25 mg/dL — ABNORMAL HIGH (ref 8–23)
CO2: 25 mmol/L (ref 22–32)
Calcium: 8.9 mg/dL (ref 8.9–10.3)
Chloride: 108 mmol/L (ref 98–111)
Creatinine: 1.27 mg/dL — ABNORMAL HIGH (ref 0.61–1.24)
GFR, Est AFR Am: >60 mL/min (ref >60)
GFR, Estimated: 53 mL/min — ABNORMAL LOW (ref >60)
Glucose, Bld: 109 mg/dL — ABNORMAL HIGH (ref 70–99)
Potassium: 4.6 mmol/L (ref 3.5–5.1)
Sodium: 140 mmol/L (ref 135–145)
Total Bilirubin: 0.7 mg/dL (ref 0.3–1.2)
Total Protein: 6.3 g/dL — ABNORMAL LOW (ref 6.5–8.1)

## 2020-05-18 MED ORDER — DENOSUMAB 60 MG/ML ~~LOC~~ SOSY
60.0000 mg | PREFILLED_SYRINGE | Freq: Once | SUBCUTANEOUS | Status: AC
  Administered 2020-05-18: 60 mg via SUBCUTANEOUS

## 2020-05-18 MED ORDER — DENOSUMAB 60 MG/ML ~~LOC~~ SOSY
PREFILLED_SYRINGE | SUBCUTANEOUS | Status: AC
  Filled 2020-05-18: qty 1

## 2020-05-18 NOTE — Patient Instructions (Signed)
Denosumab injection What is this medicine? DENOSUMAB (den oh sue mab) slows bone breakdown. Prolia is used to treat osteoporosis in women after menopause and in men, and in people who are taking corticosteroids for 6 months or more. Xgeva is used to treat a high calcium level due to cancer and to prevent bone fractures and other bone problems caused by multiple myeloma or cancer bone metastases. Xgeva is also used to treat giant cell tumor of the bone. This medicine may be used for other purposes; ask your health care provider or pharmacist if you have questions. COMMON BRAND NAME(S): Prolia, XGEVA What should I tell my health care provider before I take this medicine? They need to know if you have any of these conditions:  dental disease  having surgery or tooth extraction  infection  kidney disease  low levels of calcium or Vitamin D in the blood  malnutrition  on hemodialysis  skin conditions or sensitivity  thyroid or parathyroid disease  an unusual reaction to denosumab, other medicines, foods, dyes, or preservatives  pregnant or trying to get pregnant  breast-feeding How should I use this medicine? This medicine is for injection under the skin. It is given by a health care professional in a hospital or clinic setting. A special MedGuide will be given to you before each treatment. Be sure to read this information carefully each time. For Prolia, talk to your pediatrician regarding the use of this medicine in children. Special care may be needed. For Xgeva, talk to your pediatrician regarding the use of this medicine in children. While this drug may be prescribed for children as young as 13 years for selected conditions, precautions do apply. Overdosage: If you think you have taken too much of this medicine contact a poison control center or emergency room at once. NOTE: This medicine is only for you. Do not share this medicine with others. What if I miss a dose? It is  important not to miss your dose. Call your doctor or health care professional if you are unable to keep an appointment. What may interact with this medicine? Do not take this medicine with any of the following medications:  other medicines containing denosumab This medicine may also interact with the following medications:  medicines that lower your chance of fighting infection  steroid medicines like prednisone or cortisone This list may not describe all possible interactions. Give your health care provider a list of all the medicines, herbs, non-prescription drugs, or dietary supplements you use. Also tell them if you smoke, drink alcohol, or use illegal drugs. Some items may interact with your medicine. What should I watch for while using this medicine? Visit your doctor or health care professional for regular checks on your progress. Your doctor or health care professional may order blood tests and other tests to see how you are doing. Call your doctor or health care professional for advice if you get a fever, chills or sore throat, or other symptoms of a cold or flu. Do not treat yourself. This drug may decrease your body's ability to fight infection. Try to avoid being around people who are sick. You should make sure you get enough calcium and vitamin D while you are taking this medicine, unless your doctor tells you not to. Discuss the foods you eat and the vitamins you take with your health care professional. See your dentist regularly. Brush and floss your teeth as directed. Before you have any dental work done, tell your dentist you are   receiving this medicine. Do not become pregnant while taking this medicine or for 5 months after stopping it. Talk with your doctor or health care professional about your birth control options while taking this medicine. Women should inform their doctor if they wish to become pregnant or think they might be pregnant. There is a potential for serious side  effects to an unborn child. Talk to your health care professional or pharmacist for more information. What side effects may I notice from receiving this medicine? Side effects that you should report to your doctor or health care professional as soon as possible:  allergic reactions like skin rash, itching or hives, swelling of the face, lips, or tongue  bone pain  breathing problems  dizziness  jaw pain, especially after dental work  redness, blistering, peeling of the skin  signs and symptoms of infection like fever or chills; cough; sore throat; pain or trouble passing urine  signs of low calcium like fast heartbeat, muscle cramps or muscle pain; pain, tingling, numbness in the hands or feet; seizures  unusual bleeding or bruising  unusually weak or tired Side effects that usually do not require medical attention (report to your doctor or health care professional if they continue or are bothersome):  constipation  diarrhea  headache  joint pain  loss of appetite  muscle pain  runny nose  tiredness  upset stomach This list may not describe all possible side effects. Call your doctor for medical advice about side effects. You may report side effects to FDA at 1-800-FDA-1088. Where should I keep my medicine? This medicine is only given in a clinic, doctor's office, or other health care setting and will not be stored at home. NOTE: This sheet is a summary. It may not cover all possible information. If you have questions about this medicine, talk to your doctor, pharmacist, or health care provider.  2020 Elsevier/Gold Standard (2018-04-17 16:10:44)

## 2020-05-25 DIAGNOSIS — N179 Acute kidney failure, unspecified: Secondary | ICD-10-CM | POA: Diagnosis not present

## 2020-06-05 ENCOUNTER — Other Ambulatory Visit: Payer: Self-pay | Admitting: Hematology

## 2020-06-05 DIAGNOSIS — C9001 Multiple myeloma in remission: Secondary | ICD-10-CM

## 2020-06-05 NOTE — Telephone Encounter (Signed)
Refilled per most recent MD note

## 2020-06-21 DIAGNOSIS — C44329 Squamous cell carcinoma of skin of other parts of face: Secondary | ICD-10-CM | POA: Diagnosis not present

## 2020-06-21 DIAGNOSIS — D485 Neoplasm of uncertain behavior of skin: Secondary | ICD-10-CM | POA: Diagnosis not present

## 2020-06-23 ENCOUNTER — Other Ambulatory Visit: Payer: Self-pay

## 2020-06-23 ENCOUNTER — Inpatient Hospital Stay: Payer: Medicare Other | Attending: Hematology

## 2020-06-23 DIAGNOSIS — I10 Essential (primary) hypertension: Secondary | ICD-10-CM | POA: Insufficient documentation

## 2020-06-23 DIAGNOSIS — M810 Age-related osteoporosis without current pathological fracture: Secondary | ICD-10-CM | POA: Diagnosis not present

## 2020-06-23 DIAGNOSIS — G620 Drug-induced polyneuropathy: Secondary | ICD-10-CM | POA: Insufficient documentation

## 2020-06-23 DIAGNOSIS — E119 Type 2 diabetes mellitus without complications: Secondary | ICD-10-CM | POA: Diagnosis not present

## 2020-06-23 DIAGNOSIS — R197 Diarrhea, unspecified: Secondary | ICD-10-CM | POA: Diagnosis not present

## 2020-06-23 DIAGNOSIS — N281 Cyst of kidney, acquired: Secondary | ICD-10-CM | POA: Insufficient documentation

## 2020-06-23 DIAGNOSIS — T451X5A Adverse effect of antineoplastic and immunosuppressive drugs, initial encounter: Secondary | ICD-10-CM | POA: Insufficient documentation

## 2020-06-23 DIAGNOSIS — K219 Gastro-esophageal reflux disease without esophagitis: Secondary | ICD-10-CM | POA: Insufficient documentation

## 2020-06-23 DIAGNOSIS — C9001 Multiple myeloma in remission: Secondary | ICD-10-CM

## 2020-06-23 DIAGNOSIS — C9000 Multiple myeloma not having achieved remission: Secondary | ICD-10-CM | POA: Insufficient documentation

## 2020-06-23 DIAGNOSIS — Z85828 Personal history of other malignant neoplasm of skin: Secondary | ICD-10-CM | POA: Diagnosis not present

## 2020-06-23 LAB — CBC WITH DIFFERENTIAL/PLATELET
Abs Immature Granulocytes: 0.01 10*3/uL (ref 0.00–0.07)
Basophils Absolute: 0.1 10*3/uL (ref 0.0–0.1)
Basophils Relative: 2 %
Eosinophils Absolute: 0.1 10*3/uL (ref 0.0–0.5)
Eosinophils Relative: 4 %
HCT: 36.3 % — ABNORMAL LOW (ref 39.0–52.0)
Hemoglobin: 12 g/dL — ABNORMAL LOW (ref 13.0–17.0)
Immature Granulocytes: 0 %
Lymphocytes Relative: 20 %
Lymphs Abs: 0.7 10*3/uL (ref 0.7–4.0)
MCH: 30.6 pg (ref 26.0–34.0)
MCHC: 33.1 g/dL (ref 30.0–36.0)
MCV: 92.6 fL (ref 80.0–100.0)
Monocytes Absolute: 0.4 10*3/uL (ref 0.1–1.0)
Monocytes Relative: 10 %
Neutro Abs: 2.2 10*3/uL (ref 1.7–7.7)
Neutrophils Relative %: 64 %
Platelets: 126 10*3/uL — ABNORMAL LOW (ref 150–400)
RBC: 3.92 MIL/uL — ABNORMAL LOW (ref 4.22–5.81)
RDW: 13.6 % (ref 11.5–15.5)
WBC: 3.4 10*3/uL — ABNORMAL LOW (ref 4.0–10.5)
nRBC: 0 % (ref 0.0–0.2)

## 2020-06-23 LAB — CMP (CANCER CENTER ONLY)
ALT: 17 U/L (ref 0–44)
AST: 26 U/L (ref 15–41)
Albumin: 3.8 g/dL (ref 3.5–5.0)
Alkaline Phosphatase: 76 U/L (ref 38–126)
Anion gap: 12 (ref 5–15)
BUN: 18 mg/dL (ref 8–23)
CO2: 23 mmol/L (ref 22–32)
Calcium: 9.1 mg/dL (ref 8.9–10.3)
Chloride: 103 mmol/L (ref 98–111)
Creatinine: 1.17 mg/dL (ref 0.61–1.24)
GFR, Est AFR Am: >60 mL/min (ref >60)
GFR, Estimated: 59 mL/min — ABNORMAL LOW (ref >60)
Glucose, Bld: 98 mg/dL (ref 70–99)
Potassium: 4.6 mmol/L (ref 3.5–5.1)
Sodium: 138 mmol/L (ref 135–145)
Total Bilirubin: 0.7 mg/dL (ref 0.3–1.2)
Total Protein: 6.7 g/dL (ref 6.5–8.1)

## 2020-06-23 LAB — VITAMIN D 25 HYDROXY (VIT D DEFICIENCY, FRACTURES): Vit D, 25-Hydroxy: 99.95 ng/mL (ref 30–100)

## 2020-06-27 LAB — MULTIPLE MYELOMA PANEL, SERUM
Albumin SerPl Elph-Mcnc: 3.3 g/dL (ref 2.9–4.4)
Albumin/Glob SerPl: 1.4 (ref 0.7–1.7)
Alpha 1: 0.4 g/dL (ref 0.0–0.4)
Alpha2 Glob SerPl Elph-Mcnc: 0.6 g/dL (ref 0.4–1.0)
B-Globulin SerPl Elph-Mcnc: 0.8 g/dL (ref 0.7–1.3)
Gamma Glob SerPl Elph-Mcnc: 0.7 g/dL (ref 0.4–1.8)
Globulin, Total: 2.5 g/dL (ref 2.2–3.9)
IgA: 118 mg/dL (ref 61–437)
IgG (Immunoglobin G), Serum: 820 mg/dL (ref 603–1613)
IgM (Immunoglobulin M), Srm: 18 mg/dL (ref 15–143)
Total Protein ELP: 5.8 g/dL — ABNORMAL LOW (ref 6.0–8.5)

## 2020-06-29 ENCOUNTER — Other Ambulatory Visit: Payer: Self-pay | Admitting: Hematology

## 2020-06-29 NOTE — Progress Notes (Signed)
HEMATOLOGY/ONCOLOGY CLINIC NOTE  Date of Service: 06/30/2020   Patient Care Team: Mayra Neer, MD as PCP - General (Family Medicine) Debara Pickett Nadean Corwin, MD as PCP - Cardiology (Cardiology)   CHIEF COMPLAINTS/:  F/u for continued mx of [name]   HISTORY OF PRESENTING ILLNESS:  Justin [name] is a wonderful 79 y.o. 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  Patient has a history of hypertension, diabetes, GERD, gout, squamous cell skin cancers, elevated PSA, SVT status post ablation in May 2016, chronic back pain who has a history of recurrent CVA/TIA's. He apparently had a left hemispheric TIA June 2017 and has had recurrent) subcortical infarcts thought to be related to small vessel disease. He was noted to have a small PFO that was of questionable significance. He was initially on aspirin and then continued and aspirin + plavix for secondary stroke prevention. He follows with Dr. Antony Contras for his neurology cares.  An SPEP was done due to elevated total proteins of 8.9 and showed an M spike of 2 g/dL. He was also noted to have an elevated total protein of 8.6 in April 2017. He was referred to [place] for further evaluation of his M spike. Blood test did not reveal any hypercalcemia, no significant renal failure. He has been noted to have developed anemia since June the serum and his hemoglobin was 12.6 and is now down to the mid 10 range. He notes no focal bone pains at this time. Overt acute weight loss. No fevers no chills no night sweats.   CURRENT THERAPY:   Revlimid maintenance 31m po 3 weeks on 1 week off.   INTERVAL HISTORY   WKanaan [name] seen [name] for follow-up of his [name]. The patient's last visit with [name] on 04/20/20. The pt reports that he is doing well overall.  The pt reports he is good. Pt has been traveling and staying active. His legs have been weak and his cramps are still happening. He  had poison ivy and it has cleared up. Pt has been having some diarrhea every once in a while. Sometimes he has a lot of diarrhea and sometimes he has none. It is not correlating to when he takes his Revlimid. He has been eating lots of fruits.   Lab results [name] (06/23/20) of CBC w/diff and CMP is as follows: all values are WNL except for WBC at 3.4K, RBC at 3.92, Hemoglobin at 12, HCT at 36.3, Platelets at 126K, GFR, Est Non Af Am at 59 06/23/20 of MMP is as follows: no M spike 06/23/20 of Vitamin D 25 Hydroxy at 99.95: WNL  On review of systems, pt reports staying active, some diarrhea, healthy appetite and denies infection issues and any other symptoms.   MEDICAL HISTORY:  Past Medical History:  Diagnosis Date  . Anemia   . Arthritis   . 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 Diabetes mellitus without complication ([place]   . GERD (gastroesophageal reflux disease)   . Gout   . History of loop recorder   . Hypertension   . Left leg weakness   . Multiple [name] ([place] 09/27/2018  . Paroxysmal SVT (supraventricular tachycardia) ([place]    a. s/p RFCA on 05/01/15  . Stroke ([facility] 2017   TIA and mild stroke; denies stroke symptoms since then     SURGICAL HISTORY: Past Surgical History:  Procedure Laterality Date  . ATRIAL FIBRILLATION ABLATION     Dr. Lovena Le  . BASAL CELL CARCINOMA EXCISION     off of back  . ELECTROPHYSIOLOGIC STUDY N/A 05/01/2015   Procedure: SVT Ablation;  Surgeon: Evans Lance, MD;  Location: [place] [facility] LAB;  Service: Cardiovascular;  Laterality: N/A;  . EP IMPLANTABLE DEVICE N/A 06/04/2016   Procedure: Loop Recorder Insertion;  Surgeon: Thompson Grayer, MD;  Location: [place] CV LAB;  Service: Cardiovascular;  Laterality: N/A;  . INGUINAL HERNIA REPAIR Bilateral 06/16/2017   Procedure: LAPAROSCOPIC BILATERAL INGUINAL HERNIA REPAIR;  Surgeon: Clovis Riley, MD;  Location: [place];  Service: General;  Laterality: Bilateral;  .  INSERTION OF MESH Bilateral 06/16/2017   Procedure: INSERTION OF MESH;  Surgeon: Clovis Riley, MD;  Location: [place];  Service: General;  Laterality: Bilateral;  . MASS EXCISION Right 07/28/2018   Procedure: EXCISION OF RIGHT FOREARM MASS;  Surgeon: Clovis Riley, MD;  Location: [facility];  Service: General;  Laterality: Right;  . SQUAMOUS CELL CARCINOMA EXCISION    . TEE WITHOUT CARDIOVERSION N/A 06/04/2016   Procedure: TRANSESOPHAGEAL ECHOCARDIOGRAM (TEE);  Surgeon: Pixie Casino, MD;  Location: [facility] ENDOSCOPY;  Service: Cardiovascular;  Laterality: N/A;    SOCIAL HISTORY: Social History   Socioeconomic History  . Marital status: Married    Spouse name: Not on file  . Number of children: Not on file  . Years of education: Not on file  . Highest education level: Not on file  Occupational History  . Not on file  Tobacco Use  . Smoking status: Never Smoker  . Smokeless tobacco: Never Used  Vaping Use  . Vaping Use: Never used  Substance and Sexual Activity  . Alcohol use: No  . Drug use: No  . Sexual activity: Not on file  Other Topics Concern  . Not on file  Social History Narrative  . Not on file   Social Determinants of Health   Financial Resource Strain:   . Difficulty of Paying Living Expenses:   Food Insecurity:   . Worried About Charity fundraiser in the Last Year:   . Arboriculturist in the Last Year:   Transportation Needs:   . Film/video editor (Medical):   [name] Kitchen Lack of Transportation (Non-Medical):   Physical Activity:   . Days of Exercise per Week:   . Minutes of Exercise per Session:   Stress:   . Feeling of Stress :   Social Connections:   . Frequency of Communication with Friends and Family:   . Frequency of Social Gatherings with Friends and Family:   . Attends Religious Services:   . Active Member of Clubs or Organizations:   . Attends Archivist Meetings:   [name] Kitchen Marital Status:   Intimate Partner Violence:   . Fear of Current or  Ex-Partner:   . Emotionally Abused:   [name] Kitchen Physically Abused:   . Sexually Abused:    Justin [name]. His daughter is Mudlogger of nursing for a nursing home in [place], [place].    FAMILY HISTORY: Family History  Problem Relation Age of Onset  . Stroke Mother   . Hypertension Mother   . Alzheimer's disease Father   . Heart failure Father   . Down syndrome Daughter     ALLERGIES:  is allergic to no known allergies.  MEDICATIONS:  Current Outpatient Medications  Medication Sig Dispense Refill  . atorvastatin (LIPITOR) 40 MG  tablet Take 1 tablet (40 mg total) by mouth every morning. 90 tablet 3  . cyanocobalamin 1000 MCG tablet Take 1,000 mcg by mouth daily.    . fluorouracil (EFUDEX) 5 % cream Apply 1 application topically daily as needed (for skin cancer prevention).   0  . glucose monitoring kit (FREESTYLE) monitoring kit 1 each by Does not apply route as needed for other.    . metFORMIN (GLUCOPHAGE) 1000 MG tablet Take 1 tablet (1,000 mg total) by mouth daily with breakfast. 90 tablet 3  . nitroGLYCERIN (NITROSTAT) 0.4 MG SL tablet Place 1 tablet (0.4 mg total) under the tongue every 5 (five) minutes as needed for chest pain (Max 3 doses in 15 mins.). 30 tablet 0  . omeprazole (PRILOSEC) 20 MG capsule Take 20 mg by mouth every morning.     . pregabalin (LYRICA) 75 MG capsule Take 75 mg by mouth 3 (three) times daily.    [name] Kitchen REVLIMID 5 MG capsule TAKE 1 CAPSULE DAILY FOR 21 DAYS ON, 7 DAYS OFF, REPEAT EVERY 28 DAYS 21 capsule 0  . telmisartan-hydrochlorothiazide (MICARDIS HCT) 80-12.5 MG tablet Take 1 tablet by mouth daily. 90 tablet 3  . Vitamin D, Ergocalciferol, (DRISDOL) 1.25 MG (50000 UNIT) CAPS capsule TAKE 1 CAPSULE BY MOUTH 1 TIME A WEEK 12 capsule 3  . XARELTO 20 MG TABS tablet TAKE 1 TABLET DAILY WITH SUPPER 90 tablet 1   No current facility-administered medications for this visit.    REVIEW OF SYSTEMS:   A 10+ POINT REVIEW OF SYSTEMS WAS OBTAINED including neurology,  dermatology, psychiatry, cardiac, respiratory, lymph, extremities, GI, GU, Musculoskeletal, constitutional, breasts, reproductive, HEENT.  All pertinent positives are noted in the HPI.  All others are negative.   PHYSICAL EXAMINATION:   ECOG PERFORMANCE STATUS: 2 - Symptomatic, <50% confined to bed Vitals:   06/30/20 1146  BP: 125/71  Pulse: 60  Resp: 18  Temp: (!) 97.5 F (36.4 C)  SpO2: 99%    Wt Readings from Last 3 Encounters:  06/30/20 185 lb 8 oz (84.1 kg)  04/20/20 187 lb (84.8 kg)  02/01/20 187 lb 4.8 oz (85 kg)   Body mass index is 28.21 kg/m.   GENERAL:alert, in no acute distress and comfortable SKIN: no acute rashes, no significant lesions EYES: conjunctiva are pink and non-injected, sclera anicteric OROPHARYNX: MMM, no exudates, no oropharyngeal erythema or ulceration NECK: supple, no JVD LYMPH:  no palpable lymphadenopathy in the cervical, axillary or inguinal regions LUNGS: clear to auscultation b/l with normal respiratory effort HEART: regular rate & rhythm ABDOMEN:  normoactive bowel sounds , non tender, not distended. Extremity: no pedal edema PSYCH: alert & oriented x 3 with fluent speech NEURO: no focal motor/sensory deficits  LABORATORY DATA:  I have reviewed the data as listed. CBC Latest Ref Rng & Units 06/23/2020 05/18/2020 04/06/2020  WBC 4.0 - 10.5 K/uL 3.4(L) 2.2(L) 2.7(L)  Hemoglobin 13.0 - 17.0 g/dL 12.0(L) 10.6(L) 11.1(L)  Hematocrit 39 - 52 % 36.3(L) 32.3(L) 34.7(L)  Platelets 150 - 400 K/uL 126(L) 107(L) 119(L)  ANC 1k  CMP Latest Ref Rng & Units 06/23/2020 05/18/2020 04/06/2020  Glucose 70 - 99 mg/dL 98 109(H) 112(H)  BUN 8 - 23 mg/dL 18 25(H) 33(H)  Creatinine 0.61 - 1.24 mg/dL 1.17 1.27(H) 1.55(H)  Sodium 135 - 145 mmol/L 138 140 140  Potassium 3.5 - 5.1 mmol/L 4.6 4.6 4.3  [place] 98 - 111 mmol/L 103 108 108  CO2 22 - 32 mmol/L 23 25 23  Calcium 8.9 - 10.3 mg/dL 9.1 8.9 8.7(L)  Total Protein 6.5 - 8.1 g/dL 6.7 6.3(L) 6.7  Total  Bilirubin 0.3 - 1.2 mg/dL 0.7 0.7 0.7  Alkaline Phos 38 - 126 U/L 76 60 61  AST 15 - 41 U/L 26 32 22  ALT 0 - 44 U/L 17 16 14     Lab Results  Component Value Date   IRON 45 06/02/2017   TIBC 233 06/02/2017   IRONPCTSAT 19 (L) 06/02/2017   (Iron and TIBC)  Lab Results  Component Value Date   FERRITIN 308 08/26/2017         RADIOGRAPHIC STUDIES: I have personally reviewed the radiological images as listed and agreed with the findings in the report.       Bone Marrow Biopsy 12/24/2016 (Accession [id number])  Diagnosis Bone Marrow, Aspirate,Biopsy, and Clot, left iliac crest - HYPERCELLULAR BONE MARROW FOR AGE WITH PLASMA CELL NEOPLASM. - SEE COMMENT. PERIPHERAL BLOOD: - NORMOCYTIC-NORMOCHROMIC ANEMIA. - LEUKOPENIA.  DG Bone Survey Met (Accession [id number]) (Order [id number])  Imaging  Date: 11/28/2016 Department: [facility] Released By: Pricilla Riffle Authorizing: Brunetta Genera, MD  Exam Information   Status Exam Begun  Exam Ended   Final [99] 11/28/2016 11:59 AM 11/28/2016 12:36 PM  PACS Images   Show images for DG Bone Survey Met  Study Result   CLINICAL DATA:  Monoclonal gammopathy of unknown significance. History of squamous cell carcinoma excision, basal cell carcinoma excision.  EXAM: METASTATIC BONE SURVEY  COMPARISON:  CT neck, chest, abdomen and pelvis from 10/29/16, MRI of the head from 06/01/2016, CXR 10/27/2016, lumbar spine radiographs 06/24/2016  FINDINGS: Lateral skull: Small occipital lucency which may represent a normal arachnoid granulation posteriorly in the occiput. No definite lytic abnormality.  Cervical spine AP and lateral: Disc space narrowing at C2-3, and from C4 through C7. C4-5, C5-6 and C6-7 uncovertebral joint spurring bilaterally. No lytic abnormality.  Thoracic spine AP and lateral: T8-9 right-sided osteophytes and to a lesser degree T9-10. No lytic abnormality. Slight  multilevel lumbar thoracic disc space narrowing likely degenerative.  Lumbar spine AP and lateral: Disc space narrowing at L4-5 and to a greater extent L5-S1. No lytic disease. L3 through S1 facet sclerosis.  AP pelvis: Negative for lytic disease.  Bilateral upper and lower extremities shoulders through wrist and from the hips through ankle: Negative for lytic disease. Joint space narrowing of the femorotibial compartments both knees. Subchondral cyst of the right patella.  CXR: Clear lungs. Cardiac implantable monitoring device projects over the left heart. Aortic atherosclerosis. No lytic disease.  IMPRESSION: No findings suspicious for lytic disease.   Electronically Signed   By: Ashley Royalty M.D.   On: 11/28/2016 14:58     ASSESSMENT & PLAN:   79 y.o. 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 (12/2016) Peak M spike 2.6  Cytogenetics and [name] FISH panel.- trisomy 11 Patient has normocytic anemia without any other clear etiology. (>2g/dl lower that lower limit of normal which is 13) which per criteria would place this in the Multiple [name] category as opposed to Smoldering Multiple [name] (Borderline criterion)  No overt renal failure or hypercalcemia at this time No focal bone pains though he has significant chronic back pain related to degenerative disc disease. Bone survey shows no overt lytic lesions.  10/16/18 CT Coronary Morph revealed Coronary artery calcium score 299 Agatston units, this places the  patient in the 47th percentile for age and gender, suggesting intermediate risk for future cardiac events. 2. Nonobstructive coronary disease.  12/08/18 Bone Density study revealed that the pt has osteoporosis   07/05/2019 shows an M protein of 0.1.  2) Anemia and thrombocytopenia -- related to treatment (Revlimid) -stable  3) Grade 2 Neuropathy -- in b/l feet and halfway up  lower leg without pain -Seconadry to [employer] -improved off [place]  5) Right kidney cyst with nodularity -Reviewed the 01/05/19 US Abdomen which revealed Septated cyst arising from lower pole right kidney with a questionable solid focus within this lesion. This nodular area potentially could represent a small neoplasm developing within this complex cyst. Given this circumstance, pre and post contrast MR of the kidneys is advised to further evaluate. If there is a contraindication to MR, pre and post-contrast CT would be a reasonable alternative to further assess. Study otherwise unremarkable. -Pt will likely need urology referral and MRI, pt would like to discuss this further with his PCP before triggering a referral at this point  6) Borderline low B12 levels, previously 299 --- on replacement -  -05/05/19 Vitamin B12 at 586 -continue replacement.   7) recurrent CVA - thought to be related to small vessel disease as per neurology. Has a small PFO which could be an additional risk factor as well as his afib  8)  P afib Plan -Continue on Xarelto per cardiology.  9) H/o recurrent SCC -Underwent surgical excision on right forearm with Dr. Romana Juniper on 07/28/18. His surgical pathology showed evidence of Squamous Cell Carcinoma. Plan -no evidence of recurrence locally at the site of surgery at this time. -continue dermatology followup  10) Lower extremity discomfort - improved, stable. Likely Discogenic pain -[place] on 12/2017 revealed no blood clot -Pt has established care with orthopedist and PT.  11) Patient Active Problem List   Diagnosis Date Noted  . Osteoporosis 03/10/2019  . Multiple [name] without remission ([place]) 03/10/2019  . Chest pain 09/27/2018  . Multiple [name] ([place]) 09/27/2018  . Degeneration of lumbar intervertebral disc 01/30/2018  . Low back pain 01/30/2018  . Lumbar radiculopathy 01/30/2018  . History of loop recorder 06/12/2017  . Cryptogenic stroke ([place])  10/28/2016  . Gait abnormality   . History of CVA (cerebrovascular accident)   . History of TIA (transient ischemic attack)   . Benign essential HTN   . Paroxysmal SVT (supraventricular tachycardia) ([place])   . Acute blood loss anemia   . Acute ischemic stroke ([place])   . CVA (cerebral vascular accident) ([place]) 10/26/2016  . 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 History of recent stroke 06/06/2016  . 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 TIA (transient ischemic attack) 06/02/2016  . Numbness 06/01/2016  . Right arm numbness 06/01/2016  . Atherosclerosis of native coronary artery of native heart without angina pectoris 03/20/2016  . Hypertension   . GERD (gastroesophageal reflux disease)   . Gout   . S/P RF ablation operation for arrhythmia 12/13/2014  . Hyperlipidemia 12/13/2014  . Controlled type 2 diabetes mellitus with complication, without long-term current use of insulin ([place]) 12/07/2014    PLAN: -Discussed pt labwork [name], 06/23/20; of CBC w/diff and CMP is as follows: all values are WNL except for WBC at 3.4K, RBC at 3.92, Hemoglobin at 12, HCT at 36.3, Platelets at 126K, GFR, Est Non Af Am at 59 -Discussed 06/23/20 of MMP is as follows: no M spike -Discussed 06/23/20 of Vitamin D 25 Hydroxy at 99.95: WNL -  No lab or clinical evidence of Mulitple [name] progression or recurrence at this time -The pt has no prohibitive toxicities from continuing Revlimid at this time -M protein is still undetectable  -Pt notes Grade 1 diarrhea -Advised Revlimid can cause diarrhea  -Advised Revlimid is known to cause cramps -Advised walking, warm compress and massage to help with cramping, engage contrary muscle group to cramping muscles  -Advised possibility of increasing Revlimid  -Recommended that the pt continue to eat well, drink at least 48-64 oz of water each day, and walk 20-30 minutes each day. -Recommends continue f/u with dermatologist   -Will continue Revlimid 27m; 3 weeks on, 1 week  off  -Will see back in 10 weeks with labs 1 week prior   FOLLOW UP: RTC with Dr KIrene Limboin 10 weeks (labs in 9 weeks - that is 1 week prior to clinic visit)  The total time spent in the appt was 30 minutes and more than 50% was on counseling and direct patient cares.  All of the patient's questions were answered with apparent satisfaction. The patient knows to call the clinic with any problems, questions or concerns.  GSullivan LoneMD MRockvilleAAHIVMS SFeliciana Forensic FacilityCCook HospitalHematology/Oncology Physician CColumb[facility] (Office):       [phone](Work cell):  [phone](Fax):           [phone] I, EDawayne Cirriam acting as a scribe for Dr. GSullivan Lone   .I have reviewed the above documentation for accuracy and completeness, and I agree with the above. .Brunetta GeneraMD

## 2020-06-30 ENCOUNTER — Other Ambulatory Visit: Payer: Self-pay

## 2020-06-30 ENCOUNTER — Inpatient Hospital Stay (HOSPITAL_BASED_OUTPATIENT_CLINIC_OR_DEPARTMENT_OTHER): Payer: Medicare Other | Admitting: Hematology

## 2020-06-30 VITALS — BP 125/71 | HR 60 | Temp 97.5°F | Resp 18 | Ht 68.0 in | Wt 185.5 lb

## 2020-06-30 DIAGNOSIS — I639 Cerebral infarction, unspecified: Secondary | ICD-10-CM | POA: Diagnosis not present

## 2020-06-30 DIAGNOSIS — R197 Diarrhea, unspecified: Secondary | ICD-10-CM | POA: Diagnosis not present

## 2020-06-30 DIAGNOSIS — C9000 Multiple myeloma not having achieved remission: Secondary | ICD-10-CM | POA: Diagnosis not present

## 2020-06-30 DIAGNOSIS — C9001 Multiple myeloma in remission: Secondary | ICD-10-CM | POA: Diagnosis not present

## 2020-06-30 DIAGNOSIS — M810 Age-related osteoporosis without current pathological fracture: Secondary | ICD-10-CM

## 2020-06-30 DIAGNOSIS — E119 Type 2 diabetes mellitus without complications: Secondary | ICD-10-CM | POA: Diagnosis not present

## 2020-06-30 DIAGNOSIS — I10 Essential (primary) hypertension: Secondary | ICD-10-CM | POA: Diagnosis not present

## 2020-06-30 DIAGNOSIS — K219 Gastro-esophageal reflux disease without esophagitis: Secondary | ICD-10-CM | POA: Diagnosis not present

## 2020-07-02 ENCOUNTER — Other Ambulatory Visit: Payer: Self-pay | Admitting: Hematology

## 2020-07-02 DIAGNOSIS — C9001 Multiple myeloma in remission: Secondary | ICD-10-CM

## 2020-07-04 ENCOUNTER — Telehealth: Payer: Self-pay | Admitting: Hematology

## 2020-07-04 NOTE — Telephone Encounter (Signed)
Scheduled per 07/09 los, patient has been called and notified.

## 2020-07-05 DIAGNOSIS — D0462 Carcinoma in situ of skin of left upper limb, including shoulder: Secondary | ICD-10-CM | POA: Diagnosis not present

## 2020-07-24 DIAGNOSIS — C44329 Squamous cell carcinoma of skin of other parts of face: Secondary | ICD-10-CM | POA: Diagnosis not present

## 2020-08-04 ENCOUNTER — Other Ambulatory Visit: Payer: Self-pay | Admitting: Hematology

## 2020-08-04 DIAGNOSIS — C9001 Multiple myeloma in remission: Secondary | ICD-10-CM

## 2020-08-29 ENCOUNTER — Ambulatory Visit: Payer: Medicare Other | Attending: Internal Medicine

## 2020-08-29 DIAGNOSIS — Z23 Encounter for immunization: Secondary | ICD-10-CM

## 2020-08-29 NOTE — Progress Notes (Signed)
   Covid-19 Vaccination Clinic  Name:  Justin [name]    MRN: [id number] DOB: October 15, 1941  08/29/2020  [name] was observed post Covid-19 immunization for 15 minutes without incident. He was provided with Vaccine Information Sheet and instruction to access the V-Safe system.   [name] was instructed to call 911 with any severe reactions post vaccine: [name] Kitchen Difficulty breathing  . Swelling of face and throat  . A fast heartbeat  . A bad rash all over body  . Dizziness and weakness

## 2020-08-31 ENCOUNTER — Other Ambulatory Visit: Payer: Self-pay

## 2020-08-31 ENCOUNTER — Inpatient Hospital Stay: Payer: Medicare Other | Attending: Hematology

## 2020-08-31 DIAGNOSIS — K219 Gastro-esophageal reflux disease without esophagitis: Secondary | ICD-10-CM | POA: Insufficient documentation

## 2020-08-31 DIAGNOSIS — T451X5A Adverse effect of antineoplastic and immunosuppressive drugs, initial encounter: Secondary | ICD-10-CM | POA: Diagnosis not present

## 2020-08-31 DIAGNOSIS — Z8673 Personal history of transient ischemic attack (TIA), and cerebral infarction without residual deficits: Secondary | ICD-10-CM | POA: Diagnosis not present

## 2020-08-31 DIAGNOSIS — G620 Drug-induced polyneuropathy: Secondary | ICD-10-CM | POA: Insufficient documentation

## 2020-08-31 DIAGNOSIS — C9001 Multiple myeloma in remission: Secondary | ICD-10-CM | POA: Diagnosis not present

## 2020-08-31 DIAGNOSIS — E538 Deficiency of other specified B group vitamins: Secondary | ICD-10-CM | POA: Insufficient documentation

## 2020-08-31 DIAGNOSIS — I10 Essential (primary) hypertension: Secondary | ICD-10-CM | POA: Diagnosis not present

## 2020-08-31 DIAGNOSIS — D696 Thrombocytopenia, unspecified: Secondary | ICD-10-CM | POA: Diagnosis not present

## 2020-08-31 DIAGNOSIS — Z7901 Long term (current) use of anticoagulants: Secondary | ICD-10-CM | POA: Diagnosis not present

## 2020-08-31 DIAGNOSIS — E119 Type 2 diabetes mellitus without complications: Secondary | ICD-10-CM | POA: Diagnosis not present

## 2020-08-31 DIAGNOSIS — R197 Diarrhea, unspecified: Secondary | ICD-10-CM | POA: Diagnosis not present

## 2020-08-31 DIAGNOSIS — M8580 Other specified disorders of bone density and structure, unspecified site: Secondary | ICD-10-CM | POA: Insufficient documentation

## 2020-08-31 DIAGNOSIS — N281 Cyst of kidney, acquired: Secondary | ICD-10-CM | POA: Diagnosis not present

## 2020-08-31 DIAGNOSIS — Z79899 Other long term (current) drug therapy: Secondary | ICD-10-CM | POA: Insufficient documentation

## 2020-08-31 DIAGNOSIS — D649 Anemia, unspecified: Secondary | ICD-10-CM | POA: Diagnosis not present

## 2020-08-31 DIAGNOSIS — I480 Paroxysmal atrial fibrillation: Secondary | ICD-10-CM | POA: Diagnosis not present

## 2020-08-31 LAB — CBC WITH DIFFERENTIAL/PLATELET
Abs Immature Granulocytes: 0 10*3/uL (ref 0.00–0.07)
Basophils Absolute: 0.1 10*3/uL (ref 0.0–0.1)
Basophils Relative: 3 %
Eosinophils Absolute: 0.1 10*3/uL (ref 0.0–0.5)
Eosinophils Relative: 4 %
HCT: 36.6 % — ABNORMAL LOW (ref 39.0–52.0)
Hemoglobin: 12 g/dL — ABNORMAL LOW (ref 13.0–17.0)
Immature Granulocytes: 0 %
Lymphocytes Relative: 34 %
Lymphs Abs: 0.7 10*3/uL (ref 0.7–4.0)
MCH: 29.8 pg (ref 26.0–34.0)
MCHC: 32.8 g/dL (ref 30.0–36.0)
MCV: 90.8 fL (ref 80.0–100.0)
Monocytes Absolute: 0.4 10*3/uL (ref 0.1–1.0)
Monocytes Relative: 19 %
Neutro Abs: 0.9 10*3/uL — ABNORMAL LOW (ref 1.7–7.7)
Neutrophils Relative %: 40 %
Platelets: 112 10*3/uL — ABNORMAL LOW (ref 150–400)
RBC: 4.03 MIL/uL — ABNORMAL LOW (ref 4.22–5.81)
RDW: 14.4 % (ref 11.5–15.5)
WBC: 2.2 10*3/uL — ABNORMAL LOW (ref 4.0–10.5)
nRBC: 0 % (ref 0.0–0.2)

## 2020-08-31 LAB — CMP (CANCER CENTER ONLY)
ALT: 16 U/L (ref 0–44)
AST: 31 U/L (ref 15–41)
Albumin: 3.3 g/dL — ABNORMAL LOW (ref 3.5–5.0)
Alkaline Phosphatase: 65 U/L (ref 38–126)
Anion gap: 5 (ref 5–15)
BUN: 18 mg/dL (ref 8–23)
CO2: 25 mmol/L (ref 22–32)
Calcium: 8.3 mg/dL — ABNORMAL LOW (ref 8.9–10.3)
Chloride: 111 mmol/L (ref 98–111)
Creatinine: 1.1 mg/dL (ref 0.61–1.24)
GFR, Est AFR Am: >60 mL/min (ref >60)
GFR, Estimated: >60 mL/min (ref >60)
Glucose, Bld: 106 mg/dL — ABNORMAL HIGH (ref 70–99)
Potassium: 4.3 mmol/L (ref 3.5–5.1)
Sodium: 141 mmol/L (ref 135–145)
Total Bilirubin: 0.6 mg/dL (ref 0.3–1.2)
Total Protein: 6.1 g/dL — ABNORMAL LOW (ref 6.5–8.1)

## 2020-09-04 LAB — MULTIPLE MYELOMA PANEL, SERUM
Albumin SerPl Elph-Mcnc: 3.1 g/dL (ref 2.9–4.4)
Albumin/Glob SerPl: 1.3 (ref 0.7–1.7)
Alpha 1: 0.2 g/dL (ref 0.0–0.4)
Alpha2 Glob SerPl Elph-Mcnc: 0.7 g/dL (ref 0.4–1.0)
B-Globulin SerPl Elph-Mcnc: 0.8 g/dL (ref 0.7–1.3)
Gamma Glob SerPl Elph-Mcnc: 0.8 g/dL (ref 0.4–1.8)
Globulin, Total: 2.4 g/dL (ref 2.2–3.9)
IgA: 116 mg/dL (ref 61–437)
IgG (Immunoglobin G), Serum: 799 mg/dL (ref 603–1613)
IgM (Immunoglobulin M), Srm: 21 mg/dL (ref 15–143)
Total Protein ELP: 5.5 g/dL — ABNORMAL LOW (ref 6.0–8.5)

## 2020-09-05 ENCOUNTER — Other Ambulatory Visit: Payer: Self-pay | Admitting: *Deleted

## 2020-09-05 DIAGNOSIS — C9001 Multiple myeloma in remission: Secondary | ICD-10-CM

## 2020-09-05 MED ORDER — LENALIDOMIDE 5 MG PO CAPS: ORAL_CAPSULE | ORAL | 0 refills | Status: DC

## 2020-09-05 NOTE — Telephone Encounter (Signed)
Refill requested for Revlimid - refilled per Dr.Kale Refill escribed to [place], [place] Auth# [id number], 09/05/20

## 2020-09-06 NOTE — Progress Notes (Signed)
HEMATOLOGY/ONCOLOGY CLINIC NOTE  Date of Service: 09/07/2020   Patient Care Team: Mayra Neer, MD as PCP - General (Family Medicine) Debara Pickett Nadean Corwin, MD as PCP - Cardiology (Cardiology)   CHIEF COMPLAINTS/:  F/u for continued mx of Myeloma   HISTORY OF PRESENTING ILLNESS:  Justin [name] is a wonderful 79 y.o. 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  Patient has a history of hypertension, diabetes, GERD, gout, squamous cell skin cancers, elevated PSA, SVT status post ablation in May 2016, chronic back pain who has a history of recurrent CVA/TIA's. He apparently had a left hemispheric TIA June 2017 and has had recurrent) subcortical infarcts thought to be related to small vessel disease. He was noted to have a small PFO that was of questionable significance. He was initially on aspirin and then continued and aspirin + plavix for secondary stroke prevention. He follows with Dr. Antony Contras for his neurology cares.  An SPEP was done due to elevated total proteins of 8.9 and showed an M spike of 2 g/dL. He was also noted to have an elevated total protein of 8.6 in April 2017. He was referred to [place] for further evaluation of his M spike. Blood test did not reveal any hypercalcemia, no significant renal failure. He has been noted to have developed anemia since June the serum and his hemoglobin was 12.6 and is now down to the mid 10 range. He notes no focal bone pains at this time. Overt acute weight loss. No fevers no chills no night sweats.   CURRENT THERAPY:   Revlimid maintenance 51m po 3 weeks on 1 week off.   INTERVAL HISTORY: WNasif Boswas seen today for follow-up of his myeloma. The patient's last visit with [name] on 06/30/2020. The pt reports that he is doing well overall.  The pt reports that he discontinued Metformin & Atorvastatin upon PCP recommendations. Pt is no longer experiencing leg cramps after  stopping Atorvastatin. He restarted Metformin at half of the previous dosage. Pt had previously stopped Micardis.   Pt continues to remain active and has recently noticed some weakness in his legs. He is also having nearly constant diarrhea. It is bothersome, but is not limiting him at this time.   Pt has received the COVID19 vaccine and booster. He is scheduled to get his annual flu vaccine.   Lab results (08/31/20) of CBC w/diff and CMP is as follows: all values are WNL except for WBC at 2.2K, RBC at  4.03, Hgb at 12.0, HCT at 36.6, PLT at 112K, Neutro Abs at 0.9K, Glucose at 106, Calcium at 8.3, Total Protein at 6.1, Albumin at 3.3. 08/31/2020 MMP is as follows: all values are WNL except for Total Protein at 5.5.    On review of systems, pt reports lower extremity weakness, diarrhea and denies leg cramps, abdominal pain and any other symptoms.   MEDICAL HISTORY:  Past Medical History:  Diagnosis Date  . Anemia   . Arthritis   . 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 Diabetes mellitus without complication ([facility]   . GERD (gastroesophageal reflux disease)   . Gout   . History of loop recorder   . Hypertension   . Left leg weakness   . Multiple myeloma ([facility] 09/27/2018  . Paroxysmal SVT (supraventricular tachycardia) ([facility]    a. s/p RFCA on 05/01/15  .  Stroke [facility]) 2017   TIA and mild stroke; denies stroke symptoms since then     SURGICAL HISTORY: Past Surgical History:  Procedure Laterality Date  . ATRIAL FIBRILLATION ABLATION     Dr. Lovena Le  . BASAL CELL CARCINOMA EXCISION     off of back  . ELECTROPHYSIOLOGIC STUDY N/A 05/01/2015   Procedure: SVT Ablation;  Surgeon: Evans Lance, MD;  Location: [place] [facility];  Service: Cardiovascular;  Laterality: N/A;  . EP IMPLANTABLE DEVICE N/A 06/04/2016   Procedure: Loop Recorder Insertion;  Surgeon: Thompson Grayer, MD;  Location: [place] [facility];  Service: Cardiovascular;  Laterality: N/A;  .  INGUINAL HERNIA REPAIR Bilateral 06/16/2017   Procedure: LAPAROSCOPIC BILATERAL INGUINAL HERNIA REPAIR;  Surgeon: Clovis Riley, MD;  Location: [facility];  Service: General;  Laterality: Bilateral;  . INSERTION OF MESH Bilateral 06/16/2017   Procedure: INSERTION OF MESH;  Surgeon: Clovis Riley, MD;  Location: [facility];  Service: General;  Laterality: Bilateral;  . MASS EXCISION Right 07/28/2018   Procedure: EXCISION OF RIGHT FOREARM MASS;  Surgeon: Clovis Riley, MD;  Location: [facility];  Service: General;  Laterality: Right;  . SQUAMOUS CELL CARCINOMA EXCISION    . TEE WITHOUT CARDIOVERSION N/A 06/04/2016   Procedure: TRANSESOPHAGEAL ECHOCARDIOGRAM (TEE);  Surgeon: Pixie Casino, MD;  Location: [facility];  Service: Cardiovascular;  Laterality: N/A;    SOCIAL HISTORY: Social History   Socioeconomic History  . Marital status: Married    Spouse name: Not on file  . Number of children: Not on file  . Years of education: Not on file  . Highest education level: Not on file  Occupational History  . Not on file  Tobacco Use  . Smoking status: Never Smoker  . Smokeless tobacco: Never Used  Vaping Use  . Vaping Use: Never used  Substance and Sexual Activity  . Alcohol use: No  . Drug use: No  . Sexual activity: Not on file  Other Topics Concern  . Not on file  Social History Narrative  . Not on file   Social Determinants of Health   Financial Resource Strain:   . Difficulty of Paying Living Expenses: Not on file  Food Insecurity:   . Worried About Charity fundraiser in the Last Year: Not on file  . Ran Out of Food in the Last Year: Not on file  Transportation Needs:   . Lack of Transportation (Medical): Not on file  . Lack of Transportation (Non-Medical): Not on file  Physical Activity:   . Days of Exercise per Week: Not on file  . Minutes of Exercise per Session: Not on file  Stress:   . Feeling of Stress : Not on file  Social Connections:   . Frequency of  Communication with Friends and Family: Not on file  . Frequency of Social Gatherings with Friends and Family: Not on file  . Attends Religious Services: Not on file  . Active Member of Clubs or Organizations: Not on file  . Attends Archivist Meetings: Not on file  . Marital Status: Not on file  Intimate Partner Violence:   . Fear of Current or Ex-Partner: Not on file  . Emotionally Abused: Not on file  . Physically Abused: Not on file  . Sexually Abused: Not on file   Koleton Duchemin is retired. His daughter is Mudlogger of nursing for a nursing home in [place], [place].    FAMILY HISTORY: Family History  Problem  Relation Age of Onset  . Stroke Mother   . Hypertension Mother   . Alzheimer's disease Father   . Heart failure Father   . Down syndrome Daughter     ALLERGIES:  is allergic to no known allergies.  MEDICATIONS:  Current Outpatient Medications  Medication Sig Dispense Refill  . cyanocobalamin 1000 MCG tablet Take 1,000 mcg by mouth daily.    . fluorouracil (EFUDEX) 5 % cream Apply 1 application topically daily as needed (for skin cancer prevention).   0  . glucose monitoring kit (FREESTYLE) monitoring kit 1 each by Does not apply route as needed for other.    . lenalidomide (REVLIMID) 5 MG capsule TAKE 1 CAPSULE DAILY FOR 21 DAYS ON AND 7 DAYS OFF, REPEAT EVERY 28 DAYS. 21 capsule 0  . metFORMIN (GLUCOPHAGE) 1000 MG tablet Take 1 tablet (1,000 mg total) by mouth daily with breakfast. (Patient taking differently: Take 500 mg by mouth daily with breakfast. ) 90 tablet 3  . nitroGLYCERIN (NITROSTAT) 0.4 MG SL tablet Place 1 tablet (0.4 mg total) under the tongue every 5 (five) minutes as needed for chest pain (Max 3 doses in 15 mins.). 30 tablet 0  . omeprazole (PRILOSEC) 20 MG capsule Take 20 mg by mouth every morning.     . pregabalin (LYRICA) 75 MG capsule Take 75 mg by mouth 3 (three) times daily.    . Vitamin D, Ergocalciferol, (DRISDOL) 1.25 MG (50000 UNIT) CAPS  capsule TAKE 1 CAPSULE BY MOUTH 1 TIME A WEEK 12 capsule 3  . XARELTO 20 MG TABS tablet TAKE 1 TABLET DAILY WITH SUPPER 90 tablet 1   No current facility-administered medications for this visit.    REVIEW OF SYSTEMS:   A 10+ POINT REVIEW OF SYSTEMS WAS OBTAINED including neurology, dermatology, psychiatry, cardiac, respiratory, lymph, extremities, GI, GU, Musculoskeletal, constitutional, breasts, reproductive, HEENT.  All pertinent positives are noted in the HPI.  All others are negative.   PHYSICAL EXAMINATION:   ECOG PERFORMANCE STATUS: 2 - Symptomatic, <50% confined to bed Vitals:   09/07/20 0946  BP: 133/65  Pulse: (!) 48  Resp: 18  Temp: (!) 96.9 F (36.1 C)  SpO2: 100%    Wt Readings from Last 3 Encounters:  09/07/20 186 lb 4.8 oz (84.5 kg)  06/30/20 185 lb 8 oz (84.1 kg)  04/20/20 187 lb (84.8 kg)   Body mass index is 28.33 kg/m.   GENERAL:alert, in no acute distress and comfortable SKIN: no acute rashes, no significant lesions EYES: conjunctiva are pink and non-injected, sclera anicteric OROPHARYNX: MMM, no exudates, no oropharyngeal erythema or ulceration NECK: supple, no JVD LYMPH:  no palpable lymphadenopathy in the cervical, axillary or inguinal regions LUNGS: clear to auscultation b/l with normal respiratory effort HEART: regular rate & rhythm ABDOMEN:  normoactive bowel sounds , non tender, not distended. No palpable hepatosplenomegaly.  Extremity: 1+ pedal edema b/l PSYCH: alert & oriented x 3 with fluent speech NEURO: no focal motor/sensory deficits  LABORATORY DATA:  I have reviewed the data as listed. CBC Latest Ref Rng & Units 08/31/2020 06/23/2020 05/18/2020  WBC 4.0 - 10.5 K/uL 2.2(L) 3.4(L) 2.2(L)  Hemoglobin 13.0 - 17.0 g/dL 12.0(L) 12.0(L) 10.6(L)  Hematocrit 39 - 52 % 36.6(L) 36.3(L) 32.3(L)  Platelets 150 - 400 K/uL 112(L) 126(L) 107(L)  ANC 1k  CMP Latest Ref Rng & Units 08/31/2020 06/23/2020 05/18/2020  Glucose 70 - 99 mg/dL 106(H) 98 109(H)   BUN 8 - 23 mg/dL 18 18 25(H)  Creatinine  0.61 - 1.24 mg/dL 1.10 1.17 1.27(H)  Sodium 135 - 145 mmol/L 141 138 140  Potassium 3.5 - 5.1 mmol/L 4.3 4.6 4.6  Chloride 98 - 111 mmol/L 111 103 108  CO2 22 - 32 mmol/L 25 23 25   Calcium 8.9 - 10.3 mg/dL 8.3(L) 9.1 8.9  Total Protein 6.5 - 8.1 g/dL 6.1(L) 6.7 6.3(L)  Total Bilirubin 0.3 - 1.2 mg/dL 0.6 0.7 0.7  Alkaline Phos 38 - 126 U/L 65 76 60  AST 15 - 41 U/L 31 26 32  ALT 0 - 44 U/L 16 17 16     Lab Results  Component Value Date   IRON 45 06/02/2017   TIBC 233 06/02/2017   IRONPCTSAT 19 (L) 06/02/2017   (Iron and TIBC)  Lab Results  Component Value Date   FERRITIN 308 08/26/2017         RADIOGRAPHIC STUDIES: I have personally reviewed the radiological images as listed and agreed with the findings in the report.       Bone Marrow Biopsy 12/24/2016 (Accession [id number])  Diagnosis Bone Marrow, Aspirate,Biopsy, and Clot, left iliac crest - HYPERCELLULAR BONE MARROW FOR AGE WITH PLASMA CELL NEOPLASM. - SEE COMMENT. PERIPHERAL BLOOD: - NORMOCYTIC-NORMOCHROMIC ANEMIA. - LEUKOPENIA.  DG Bone Survey Met (Accession [id number]) (Order [id number])  Imaging  Date: 11/28/2016 Department: [facility]RAD[facility] Released By: Pricilla Riffle Authorizing: Brunetta Genera, MD  Exam Information   Status Exam Begun  Exam Ended   Final [99] 11/28/2016 11:59 AM 11/28/2016 12:36 PM  PACS Images   Show images for DG Bone Survey Met  Study Result   CLINICAL DATA:  Monoclonal gammopathy of unknown significance. History of squamous cell carcinoma excision, basal cell carcinoma excision.  EXAM: METASTATIC BONE SURVEY  COMPARISON:  CT neck, chest, abdomen and pelvis from 10/29/16, MRI of the head from 06/01/2016, CXR 10/27/2016, lumbar spine radiographs 06/24/2016  FINDINGS: Lateral skull: Small occipital lucency which may represent a normal arachnoid granulation posteriorly in the occiput. No  definite lytic abnormality.  Cervical spine AP and lateral: Disc space narrowing at C2-3, and from C4 through C7. C4-5, C5-6 and C6-7 uncovertebral joint spurring bilaterally. No lytic abnormality.  Thoracic spine AP and lateral: T8-9 right-sided osteophytes and to a lesser degree T9-10. No lytic abnormality. Slight multilevel lumbar thoracic disc space narrowing likely degenerative.  Lumbar spine AP and lateral: Disc space narrowing at L4-5 and to a greater extent L5-S1. No lytic disease. L3 through S1 facet sclerosis.  AP pelvis: Negative for lytic disease.  Bilateral upper and lower extremities shoulders through wrist and from the hips through ankle: Negative for lytic disease. Joint space narrowing of the femorotibial compartments both knees. Subchondral cyst of the right patella.  CXR: Clear lungs. Cardiac implantable monitoring device projects over the left heart. Aortic atherosclerosis. No lytic disease.  IMPRESSION: No findings suspicious for lytic disease.   Electronically Signed   By: Ashley Royalty M.D.   On: 11/28/2016 14:58     ASSESSMENT & PLAN:   79 y.o. 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 (12/2016) Peak M spike 2.6  Cytogenetics and Myeloma FISH panel.- trisomy 11 Patient has normocytic anemia without any other clear etiology. (>2g/dl lower that lower limit of normal which is 13) which per criteria would place this in the Multiple myeloma category as opposed to Smoldering Multiple myeloma (Borderline criterion)  No overt renal failure or  hypercalcemia at this time No focal bone pains though he has significant chronic back pain related to degenerative disc disease. Bone survey shows no overt lytic lesions.  10/16/18 CT Coronary Morph revealed Coronary artery calcium score 299 Agatston units, this places the patient in the 47th percentile for age and gender,  suggesting intermediate risk for future cardiac events. 2. Nonobstructive coronary disease.  12/08/18 Bone Density study revealed that the pt has osteoporosis   07/05/2019 shows an M protein of 0.1.  2) Anemia and thrombocytopenia -- related to treatment (Revlimid) -stable  3) Grade 2 Neuropathy -- in b/l feet and halfway up lower leg without pain -Seconadry to [employer] -improved off [place]  5) Right kidney cyst with nodularity -Reviewed the 01/05/19 US Abdomen which revealed Septated cyst arising from lower pole right kidney with a questionable solid focus within this lesion. This nodular area potentially could represent a small neoplasm developing within this complex cyst. Given this circumstance, pre and post contrast MR of the kidneys is advised to further evaluate. If there is a contraindication to MR, pre and post-contrast CT would be a reasonable alternative to further assess. Study otherwise unremarkable. -Pt will likely need urology referral and MRI, pt would like to discuss this further with his PCP before triggering a referral at this point  6) Borderline low B12 levels, previously 299 --- on replacement -  -05/05/19 Vitamin B12 at 586 -continue replacement.   7) recurrent CVA - thought to be related to small vessel disease as per neurology. Has a small PFO which could be an additional risk factor as well as his afib  8)  P afib Plan -Continue on Xarelto per cardiology.  9) H/o recurrent SCC -Underwent surgical excision on right forearm with Dr. Romana Juniper on 07/28/18. His surgical pathology showed evidence of Squamous Cell Carcinoma. Plan -no evidence of recurrence locally at the site of surgery at this time. -continue dermatology followup  10) Lower extremity discomfort - improved, stable. Likely Discogenic pain -[place] on 12/2017 revealed no blood clot -Pt has established care with orthopedist and PT.  11) Patient Active Problem List   Diagnosis Date Noted  .  Osteoporosis 03/10/2019  . Multiple myeloma without remission ([place]) 03/10/2019  . Chest pain 09/27/2018  . Multiple myeloma ([place]) 09/27/2018  . Degeneration of lumbar intervertebral disc 01/30/2018  . Low back pain 01/30/2018  . Lumbar radiculopathy 01/30/2018  . History of loop recorder 06/12/2017  . Cryptogenic stroke ([place]) 10/28/2016  . Gait abnormality   . History of CVA (cerebrovascular accident)   . History of TIA (transient ischemic attack)   . Benign essential HTN   . Paroxysmal SVT (supraventricular tachycardia) ([place])   . Acute blood loss anemia   . Acute ischemic stroke ([place])   . CVA (cerebral vascular accident) ([place]) 10/26/2016  . 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 History of recent stroke 06/06/2016  . 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 TIA (transient ischemic attack) 06/02/2016  . Numbness 06/01/2016  . Right arm numbness 06/01/2016  . Atherosclerosis of native coronary artery of native heart without angina pectoris 03/20/2016  . Hypertension   . GERD (gastroesophageal reflux disease)   . Gout   . S/P RF ablation operation for arrhythmia 12/13/2014  . Hyperlipidemia 12/13/2014  . Controlled type 2 diabetes mellitus with complication, without long-term current use of insulin ([place]) 12/07/2014    PLAN: -Discussed pt labwork today, 09/07/20; no anemia, neutropenia, mild thrombocytopenia, blood chemistries are stable, no M Protein.  -  Recommended that the pt continue to eat a heart healthy diet, drink at least 48-64 oz of water each day, and walk 20-30 minutes each day. -Recommend pt wait two weeks between vaccinations if possible. Recommend pt f/u for annual flu vaccine as scheduled.  -No lab or clinical evidence of Mulitple Myeloma progression or recurrence at this time. -The pt has no prohibitive toxicities from continuing 5 mg Revlimid 3 weeks on, 1 week off at this time. -Pt notes Grade 1 diarrhea. Not limiting at this time. -Continue Prolia q13month -Recommend  pt f/u with PCP for medication management. -Will see back in 10 weeks, with labs 1 week prior   FOLLOW UP: RTC with Dr KIrene Limboin 10 weeks (labs in 9 weeks - that is 1 week prior to clinic visit) Continue Prolia q6 months x 2   The total time spent in the appt was 20 minutes and more than 50% was on counseling and direct patient cares.  All of the patient's questions were answered with apparent satisfaction. The patient knows to call the clinic with any problems, questions or concerns.   GSullivan LoneMD MNewtonAAHIVMS SPhysicians Surgery Center Of Nevada, LLCCNovato Community HospitalHematology/Oncology Physician [facility] (Office):       [phone](Work cell):  [phone](Fax):           [phone] I, [name] am acting as a scribe for Dr. GSullivan Lone   .I have reviewed the above documentation for accuracy and completeness, and I agree with the above. .Brunetta GeneraMD

## 2020-09-07 ENCOUNTER — Other Ambulatory Visit: Payer: Self-pay

## 2020-09-07 ENCOUNTER — Telehealth: Payer: Self-pay | Admitting: Hematology

## 2020-09-07 ENCOUNTER — Inpatient Hospital Stay (HOSPITAL_BASED_OUTPATIENT_CLINIC_OR_DEPARTMENT_OTHER): Payer: Medicare Other | Admitting: Hematology

## 2020-09-07 VITALS — BP 133/65 | HR 48 | Temp 96.9°F | Resp 18 | Ht 68.0 in | Wt 186.3 lb

## 2020-09-07 DIAGNOSIS — C9001 Multiple myeloma in remission: Secondary | ICD-10-CM | POA: Diagnosis not present

## 2020-09-07 DIAGNOSIS — I639 Cerebral infarction, unspecified: Secondary | ICD-10-CM | POA: Diagnosis not present

## 2020-09-07 DIAGNOSIS — E559 Vitamin D deficiency, unspecified: Secondary | ICD-10-CM

## 2020-09-07 DIAGNOSIS — D696 Thrombocytopenia, unspecified: Secondary | ICD-10-CM | POA: Diagnosis not present

## 2020-09-07 DIAGNOSIS — G620 Drug-induced polyneuropathy: Secondary | ICD-10-CM | POA: Diagnosis not present

## 2020-09-07 DIAGNOSIS — D649 Anemia, unspecified: Secondary | ICD-10-CM | POA: Diagnosis not present

## 2020-09-07 DIAGNOSIS — M8580 Other specified disorders of bone density and structure, unspecified site: Secondary | ICD-10-CM | POA: Diagnosis not present

## 2020-09-07 DIAGNOSIS — I480 Paroxysmal atrial fibrillation: Secondary | ICD-10-CM | POA: Diagnosis not present

## 2020-09-07 NOTE — Telephone Encounter (Signed)
Scheduled per 09/16 los, patient received updated calender.

## 2020-09-16 DIAGNOSIS — Z23 Encounter for immunization: Secondary | ICD-10-CM | POA: Diagnosis not present

## 2020-10-03 ENCOUNTER — Other Ambulatory Visit: Payer: Self-pay | Admitting: *Deleted

## 2020-10-03 DIAGNOSIS — C9001 Multiple myeloma in remission: Secondary | ICD-10-CM

## 2020-10-03 MED ORDER — LENALIDOMIDE 5 MG PO CAPS: ORAL_CAPSULE | ORAL | 0 refills | Status: DC

## 2020-10-03 NOTE — Telephone Encounter (Signed)
Refill Revlimid per MD OV note 09/07/20 Refill escribed to [facility] [place] [facility] Auth# [id number], 10/03/20

## 2020-10-06 ENCOUNTER — Telehealth: Payer: Self-pay | Admitting: Internal Medicine

## 2020-10-06 ENCOUNTER — Other Ambulatory Visit: Payer: Self-pay | Admitting: Physician Assistant

## 2020-10-06 MED ORDER — RIVAROXABAN 20 MG PO TABS: ORAL_TABLET | ORAL | 1 refills | Status: DC

## 2020-10-06 NOTE — Telephone Encounter (Signed)
Informed patient medication refilled for 90 days x 1 . Follow up appointment in 01/03/21 with dr Debara Pickett .[name] Kitchen

## 2020-10-06 NOTE — Telephone Encounter (Signed)
     Pt c/o medication issue:  1. Name of Medication: XARELTO 20 MG TABS tablet  2. How are you currently taking this medication (dosage and times per day)? TAKE 1 TABLET DAILY WITH SUPPER  3. Are you having a reaction (difficulty breathing--STAT)?   4. What is your medication issue? Pt asked if Dr. Debara Pickett can refill this for him. He no longer have his loop recorder, he said he no longer need to see Dr. Rayann Heman or Joseph Art. He asked if Dr. Debara Pickett can send a prescription for xarelto to [place], [place]

## 2020-10-24 DIAGNOSIS — H2513 Age-related nuclear cataract, bilateral: Secondary | ICD-10-CM | POA: Diagnosis not present

## 2020-10-24 DIAGNOSIS — H43813 Vitreous degeneration, bilateral: Secondary | ICD-10-CM | POA: Diagnosis not present

## 2020-10-24 DIAGNOSIS — E119 Type 2 diabetes mellitus without complications: Secondary | ICD-10-CM | POA: Diagnosis not present

## 2020-10-24 DIAGNOSIS — H524 Presbyopia: Secondary | ICD-10-CM | POA: Diagnosis not present

## 2020-10-25 DIAGNOSIS — G629 Polyneuropathy, unspecified: Secondary | ICD-10-CM | POA: Diagnosis not present

## 2020-10-25 DIAGNOSIS — Z0000 Encounter for general adult medical examination without abnormal findings: Secondary | ICD-10-CM | POA: Diagnosis not present

## 2020-10-25 DIAGNOSIS — I25119 Atherosclerotic heart disease of native coronary artery with unspecified angina pectoris: Secondary | ICD-10-CM | POA: Diagnosis not present

## 2020-10-25 DIAGNOSIS — Z1389 Encounter for screening for other disorder: Secondary | ICD-10-CM | POA: Diagnosis not present

## 2020-10-25 DIAGNOSIS — I10 Essential (primary) hypertension: Secondary | ICD-10-CM | POA: Diagnosis not present

## 2020-10-25 DIAGNOSIS — D6869 Other thrombophilia: Secondary | ICD-10-CM | POA: Diagnosis not present

## 2020-10-25 DIAGNOSIS — D692 Other nonthrombocytopenic purpura: Secondary | ICD-10-CM | POA: Diagnosis not present

## 2020-10-25 DIAGNOSIS — C9000 Multiple myeloma not having achieved remission: Secondary | ICD-10-CM | POA: Diagnosis not present

## 2020-10-25 DIAGNOSIS — N289 Disorder of kidney and ureter, unspecified: Secondary | ICD-10-CM | POA: Diagnosis not present

## 2020-10-25 DIAGNOSIS — E1169 Type 2 diabetes mellitus with other specified complication: Secondary | ICD-10-CM | POA: Diagnosis not present

## 2020-10-25 DIAGNOSIS — E7800 Pure hypercholesterolemia, unspecified: Secondary | ICD-10-CM | POA: Diagnosis not present

## 2020-10-25 DIAGNOSIS — I4891 Unspecified atrial fibrillation: Secondary | ICD-10-CM | POA: Diagnosis not present

## 2020-11-03 ENCOUNTER — Other Ambulatory Visit: Payer: Self-pay | Admitting: Hematology

## 2020-11-03 DIAGNOSIS — C9001 Multiple myeloma in remission: Secondary | ICD-10-CM

## 2020-11-07 ENCOUNTER — Other Ambulatory Visit: Payer: Self-pay | Admitting: *Deleted

## 2020-11-07 DIAGNOSIS — C9001 Multiple myeloma in remission: Secondary | ICD-10-CM

## 2020-11-07 MED ORDER — LENALIDOMIDE 5 MG PO CAPS: ORAL_CAPSULE | ORAL | 0 refills | Status: DC

## 2020-11-08 ENCOUNTER — Telehealth: Payer: Self-pay | Admitting: Hematology

## 2020-11-08 NOTE — Telephone Encounter (Signed)
Called pt per 11/17 sch msg - no answer. Left message for patient to call back to reschedule.

## 2020-11-09 ENCOUNTER — Inpatient Hospital Stay: Payer: Medicare Other

## 2020-11-10 ENCOUNTER — Telehealth: Payer: Self-pay | Admitting: *Deleted

## 2020-11-10 NOTE — Telephone Encounter (Signed)
Patient called. In [place]. Wants to cancel lab appt for yesterday (11/18) and Inj/MD appts on 11/30. Asked to reschedule lab for after Dec 1 and MD/Inj appts 7-10 days later. Appts for 11/18 and 11/30 cancelled.  Schedule message sent.

## 2020-11-14 ENCOUNTER — Telehealth: Payer: Self-pay | Admitting: Hematology

## 2020-11-14 NOTE — Telephone Encounter (Signed)
Per 11/19 sch msg. Called pt to reschedule appts left a msg for a call back

## 2020-11-17 ENCOUNTER — Ambulatory Visit: Payer: Medicare Other

## 2020-11-21 ENCOUNTER — Ambulatory Visit: Payer: Medicare Other

## 2020-11-21 ENCOUNTER — Ambulatory Visit: Payer: Medicare Other | Admitting: Hematology

## 2020-11-22 ENCOUNTER — Telehealth: Payer: Self-pay | Admitting: Hematology

## 2020-11-22 NOTE — Telephone Encounter (Signed)
Scheduled per 11/19 sch msg. Called and spoke with pt, confirmed 12/6 and 12/17 appts

## 2020-11-23 DIAGNOSIS — H25011 Cortical age-related cataract, right eye: Secondary | ICD-10-CM | POA: Diagnosis not present

## 2020-11-23 DIAGNOSIS — H2511 Age-related nuclear cataract, right eye: Secondary | ICD-10-CM | POA: Diagnosis not present

## 2020-11-23 DIAGNOSIS — H25811 Combined forms of age-related cataract, right eye: Secondary | ICD-10-CM | POA: Diagnosis not present

## 2020-11-23 DIAGNOSIS — H25041 Posterior subcapsular polar age-related cataract, right eye: Secondary | ICD-10-CM | POA: Diagnosis not present

## 2020-11-27 ENCOUNTER — Other Ambulatory Visit: Payer: Self-pay

## 2020-11-27 ENCOUNTER — Inpatient Hospital Stay: Payer: Medicare Other | Attending: Hematology

## 2020-11-27 DIAGNOSIS — C9000 Multiple myeloma not having achieved remission: Secondary | ICD-10-CM | POA: Diagnosis not present

## 2020-11-27 DIAGNOSIS — C9001 Multiple myeloma in remission: Secondary | ICD-10-CM

## 2020-11-27 DIAGNOSIS — E559 Vitamin D deficiency, unspecified: Secondary | ICD-10-CM

## 2020-11-27 LAB — CMP (CANCER CENTER ONLY)
ALT: 16 U/L (ref 0–44)
AST: 25 U/L (ref 15–41)
Albumin: 3.8 g/dL (ref 3.5–5.0)
Alkaline Phosphatase: 64 U/L (ref 38–126)
Anion gap: 7 (ref 5–15)
BUN: 21 mg/dL (ref 8–23)
CO2: 29 mmol/L (ref 22–32)
Calcium: 9.3 mg/dL (ref 8.9–10.3)
Chloride: 104 mmol/L (ref 98–111)
Creatinine: 1.22 mg/dL (ref 0.61–1.24)
GFR, Estimated: 60 mL/min — ABNORMAL LOW (ref >60)
Glucose, Bld: 124 mg/dL — ABNORMAL HIGH (ref 70–99)
Potassium: 4.8 mmol/L (ref 3.5–5.1)
Sodium: 140 mmol/L (ref 135–145)
Total Bilirubin: 0.6 mg/dL (ref 0.3–1.2)
Total Protein: 6.7 g/dL (ref 6.5–8.1)

## 2020-11-27 LAB — CBC WITH DIFFERENTIAL/PLATELET
Abs Immature Granulocytes: 0 10*3/uL (ref 0.00–0.07)
Basophils Absolute: 0.1 10*3/uL (ref 0.0–0.1)
Basophils Relative: 2 %
Eosinophils Absolute: 0.1 10*3/uL (ref 0.0–0.5)
Eosinophils Relative: 4 %
HCT: 37.5 % — ABNORMAL LOW (ref 39.0–52.0)
Hemoglobin: 12.6 g/dL — ABNORMAL LOW (ref 13.0–17.0)
Immature Granulocytes: 0 %
Lymphocytes Relative: 21 %
Lymphs Abs: 0.8 10*3/uL (ref 0.7–4.0)
MCH: 30.4 pg (ref 26.0–34.0)
MCHC: 33.6 g/dL (ref 30.0–36.0)
MCV: 90.6 fL (ref 80.0–100.0)
Monocytes Absolute: 0.5 10*3/uL (ref 0.1–1.0)
Monocytes Relative: 13 %
Neutro Abs: 2.3 10*3/uL (ref 1.7–7.7)
Neutrophils Relative %: 60 %
Platelets: 126 10*3/uL — ABNORMAL LOW (ref 150–400)
RBC: 4.14 MIL/uL — ABNORMAL LOW (ref 4.22–5.81)
RDW: 14.2 % (ref 11.5–15.5)
WBC: 3.7 10*3/uL — ABNORMAL LOW (ref 4.0–10.5)
nRBC: 0 % (ref 0.0–0.2)

## 2020-11-27 LAB — VITAMIN D 25 HYDROXY (VIT D DEFICIENCY, FRACTURES): Vit D, 25-Hydroxy: 81.31 ng/mL (ref 30–100)

## 2020-11-28 LAB — MULTIPLE MYELOMA PANEL, SERUM
Albumin SerPl Elph-Mcnc: 3.5 g/dL (ref 2.9–4.4)
Albumin/Glob SerPl: 1.4 (ref 0.7–1.7)
Alpha 1: 0.2 g/dL (ref 0.0–0.4)
Alpha2 Glob SerPl Elph-Mcnc: 0.7 g/dL (ref 0.4–1.0)
B-Globulin SerPl Elph-Mcnc: 0.9 g/dL (ref 0.7–1.3)
Gamma Glob SerPl Elph-Mcnc: 0.8 g/dL (ref 0.4–1.8)
Globulin, Total: 2.6 g/dL (ref 2.2–3.9)
IgA: 129 mg/dL (ref 61–437)
IgG (Immunoglobin G), Serum: 852 mg/dL (ref 603–1613)
IgM (Immunoglobulin M), Srm: 19 mg/dL (ref 15–143)
Total Protein ELP: 6.1 g/dL (ref 6.0–8.5)

## 2020-12-04 ENCOUNTER — Other Ambulatory Visit: Payer: Self-pay | Admitting: Hematology

## 2020-12-04 DIAGNOSIS — C9001 Multiple myeloma in remission: Secondary | ICD-10-CM

## 2020-12-07 NOTE — Progress Notes (Signed)
HEMATOLOGY/ONCOLOGY CLINIC NOTE  Date of Service: 12/08/2020   Patient Care Team: Mayra Neer, MD as PCP - General (Family Medicine) Debara Pickett Nadean Corwin, MD as PCP - Cardiology (Cardiology)   CHIEF COMPLAINTS/:  F/u for continued mx of Myeloma   HISTORY OF PRESENTING ILLNESS:  Justin [name] is a wonderful 79 y.o. 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  Patient has a history of hypertension, diabetes, GERD, gout, squamous cell skin cancers, elevated PSA, SVT status post ablation in May 2016, chronic back pain who has a history of recurrent CVA/TIA's. He apparently had a left hemispheric TIA June 2017 and has had recurrent) subcortical infarcts thought to be related to small vessel disease. He was noted to have a small PFO that was of questionable significance. He was initially on aspirin and then continued and aspirin + plavix for secondary stroke prevention. He follows with Dr. Antony Contras for his neurology cares.  An SPEP was done due to elevated total proteins of 8.9 and showed an M spike of 2 g/dL. He was also noted to have an elevated total protein of 8.6 in April 2017. He was referred to [place] for further evaluation of his M spike. Blood test did not reveal any hypercalcemia, no significant renal failure. He has been noted to have developed anemia since June the serum and his hemoglobin was 12.6 and is now down to the mid 10 range. He notes no focal bone pains at this time. Overt acute weight loss. No fevers no chills no night sweats.   CURRENT THERAPY:   Revlimid maintenance 5 mg po 3 weeks on 1 week off.   INTERVAL HISTORY: Justin [name] was seen today for follow-up of his myeloma. The patient's last visit with [place] was on 09/07/2020. The pt reports that he is doing well overall.  The pt reports that he has felt well in the interim and is not experiencing any new symptoms. He continues to tolerate Revlimid  well. Pt continues to experience occasional muscle cramps and diarrhea that is not bothersome. Pt has received his annual flu vaccine and COVID19 booster.   Lab results (11/27/20) of CBC w/diff and CMP is as follows: all values are WNL except for WBC at 3.7K, RBC at 4.14, Hgb at 12.6, HCT at 37.5, PLT at 126K, Glucose at 124, GFR Est at 60. 11/27/2020 MMP shows all values are WNL 11/27/2020 Vitamin D at 81.31  On review of systems, pt denies new bone pain, abdominal pain, leg swelling and any other symptoms.   MEDICAL HISTORY:  Past Medical History:  Diagnosis Date  . Anemia   . Arthritis   . 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 Diabetes mellitus without complication ([place])   . GERD (gastroesophageal reflux disease)   . Gout   . History of loop recorder   . Hypertension   . Left leg weakness   . Multiple myeloma ([place]) 09/27/2018  . Paroxysmal SVT (supraventricular tachycardia) ([place])    a. s/p RFCA on 05/01/15  . Stroke [facility]) 2017   TIA and mild stroke; denies stroke symptoms since then     SURGICAL HISTORY: Past Surgical History:  Procedure Laterality Date  . ATRIAL FIBRILLATION ABLATION     Dr. Lovena Le  . BASAL CELL CARCINOMA EXCISION     off of back  . ELECTROPHYSIOLOGIC STUDY N/A 05/01/2015   Procedure:  SVT Ablation;  Surgeon: Evans Lance, MD;  Location: [place] [facility];  Service: Cardiovascular;  Laterality: N/A;  . EP IMPLANTABLE DEVICE N/A 06/04/2016   Procedure: Loop Recorder Insertion;  Surgeon: Thompson Grayer, MD;  Location: [place] [facility];  Service: Cardiovascular;  Laterality: N/A;  . INGUINAL HERNIA REPAIR Bilateral 06/16/2017   Procedure: LAPAROSCOPIC BILATERAL INGUINAL HERNIA REPAIR;  Surgeon: Clovis Riley, MD;  Location: [place];  Service: General;  Laterality: Bilateral;  . INSERTION OF MESH Bilateral 06/16/2017   Procedure: INSERTION OF MESH;  Surgeon: Clovis Riley, MD;  Location: [place];  Service: General;  Laterality:  Bilateral;  . MASS EXCISION Right 07/28/2018   Procedure: EXCISION OF RIGHT FOREARM MASS;  Surgeon: Clovis Riley, MD;  Location: [facility];  Service: General;  Laterality: Right;  . SQUAMOUS CELL CARCINOMA EXCISION    . TEE WITHOUT CARDIOVERSION N/A 06/04/2016   Procedure: TRANSESOPHAGEAL ECHOCARDIOGRAM (TEE);  Surgeon: Pixie Casino, MD;  Location: [facility];  Service: Cardiovascular;  Laterality: N/A;    SOCIAL HISTORY: Social History   Socioeconomic History  . Marital status: Married    Spouse name: Not on file  . Number of children: Not on file  . Years of education: Not on file  . Highest education level: Not on file  Occupational History  . Not on file  Tobacco Use  . Smoking status: Never Smoker  . Smokeless tobacco: Never Used  Vaping Use  . Vaping Use: Never used  Substance and Sexual Activity  . Alcohol use: No  . Drug use: No  . Sexual activity: Not on file  Other Topics Concern  . Not on file  Social History Narrative  . Not on file   Social Determinants of Health   Financial Resource Strain: Not on file  Food Insecurity: Not on file  Transportation Needs: Not on file  Physical Activity: Not on file  Stress: Not on file  Social Connections: Not on file  Intimate Partner Violence: Not on file   Justin [name] is retired. His daughter is Mudlogger of nursing for a nursing home in [place], [place].    FAMILY HISTORY: Family History  Problem Relation Age of Onset  . Stroke Mother   . Hypertension Mother   . Alzheimer's disease Father   . Heart failure Father   . Down syndrome Daughter     ALLERGIES:  is allergic to no known allergies.  MEDICATIONS:  Current Outpatient Medications  Medication Sig Dispense Refill  . allopurinol (ZYLOPRIM) 100 MG tablet Take by mouth.    . clobetasol ointment (TEMOVATE) 0.05 % Apply topically 2 (two) times daily.    . cyanocobalamin 1000 MCG tablet Take 1,000 mcg by mouth daily.    . Ferrous Sulfate (IRON) 90 (18 Fe) MG  TABS Take by mouth.    . fluorouracil (EFUDEX) 5 % cream Apply 1 application topically daily as needed (for skin cancer prevention).   0  . FREESTYLE LITE test strip     . glucose monitoring kit (FREESTYLE) monitoring kit 1 each by Does not apply route as needed for other.    . Lancets (FREESTYLE) lancets 1 each daily.    [name] Kitchen lenalidomide (REVLIMID) 5 MG capsule TAKE 1 CAPSULE DAILY FOR 21 DAYS ON AND 7 DAYS OFF, REPEAT EVERY 28 DAYS. 21 capsule 0  . LYRICA 50 MG capsule Take by mouth.    . metFORMIN (GLUCOPHAGE) 1000 MG tablet Take 1 tablet (1,000 mg total) by mouth  daily with breakfast. (Patient taking differently: Take 500 mg by mouth daily with breakfast. ) 90 tablet 3  . nitroGLYCERIN (NITROSTAT) 0.4 MG SL tablet Place 1 tablet (0.4 mg total) under the tongue every 5 (five) minutes as needed for chest pain (Max 3 doses in 15 mins.). 30 tablet 0  . omeprazole (PRILOSEC) 20 MG capsule Take 20 mg by mouth every morning.     . Omeprazole 20 MG TBEC Take by mouth.    . pregabalin (LYRICA) 75 MG capsule Take 75 mg by mouth 3 (three) times daily.    . rivaroxaban (XARELTO) 20 MG TABS tablet TAKE 1 TABLET DAILY WITH SUPPER 90 tablet 1  . Vitamin D, Ergocalciferol, (DRISDOL) 1.25 MG (50000 UNIT) CAPS capsule TAKE 1 CAPSULE BY MOUTH 1 TIME A WEEK 12 capsule 3   No current facility-administered medications for this visit.    REVIEW OF SYSTEMS:   A 10+ POINT REVIEW OF SYSTEMS WAS OBTAINED including neurology, dermatology, psychiatry, cardiac, respiratory, lymph, extremities, GI, GU, Musculoskeletal, constitutional, breasts, reproductive, HEENT.  All pertinent positives are noted in the HPI.  All others are negative.   PHYSICAL EXAMINATION:   ECOG PERFORMANCE STATUS: 2 - Symptomatic, <50% confined to bed Vitals:   12/08/20 0948  BP: 121/62  Pulse: 65  Resp: 18  Temp: 99.5 F (37.5 C)  SpO2: 97%    Wt Readings from Last 3 Encounters:  12/08/20 187 lb 11.2 oz (85.1 kg)  09/07/20 186 lb 4.8 oz  (84.5 kg)  06/30/20 185 lb 8 oz (84.1 kg)   Body mass index is 28.54 kg/m.   GENERAL:alert, in no acute distress and comfortable SKIN: no acute rashes, no significant lesions EYES: conjunctiva are pink and non-injected, sclera anicteric OROPHARYNX: MMM, no exudates, no oropharyngeal erythema or ulceration NECK: supple, no JVD LYMPH:  no palpable lymphadenopathy in the cervical, axillary or inguinal regions LUNGS: clear to auscultation b/l with normal respiratory effort HEART: regular rate & rhythm ABDOMEN:  normoactive bowel sounds , non tender, not distended. No palpable hepatosplenomegaly.  Extremity: no pedal edema PSYCH: alert & oriented x 3 with fluent speech NEURO: no focal motor/sensory deficits  LABORATORY DATA:  I have reviewed the data as listed. CBC Latest Ref Rng & Units 11/27/2020 08/31/2020 06/23/2020  WBC 4.0 - 10.5 K/uL 3.7(L) 2.2(L) 3.4(L)  Hemoglobin 13.0 - 17.0 g/dL 12.6(L) 12.0(L) 12.0(L)  Hematocrit 39.0 - 52.0 % 37.5(L) 36.6(L) 36.3(L)  Platelets 150 - 400 K/uL 126(L) 112(L) 126(L)  ANC 1k  CMP Latest Ref Rng & Units 11/27/2020 08/31/2020 06/23/2020  Glucose 70 - 99 mg/dL 124(H) 106(H) 98  BUN 8 - 23 mg/dL 21 18 18   Creatinine 0.61 - 1.24 mg/dL 1.22 1.10 1.17  Sodium 135 - 145 mmol/L 140 141 138  Potassium 3.5 - 5.1 mmol/L 4.8 4.3 4.6  Chloride 98 - 111 mmol/L 104 111 103  CO2 22 - 32 mmol/L 29 25 23   Calcium 8.9 - 10.3 mg/dL 9.3 8.3(L) 9.1  Total Protein 6.5 - 8.1 g/dL 6.7 6.1(L) 6.7  Total Bilirubin 0.3 - 1.2 mg/dL 0.6 0.6 0.7  Alkaline Phos 38 - 126 U/L 64 65 76  AST 15 - 41 U/L 25 31 26   ALT 0 - 44 U/L 16 16 17     Lab Results  Component Value Date   IRON 45 06/02/2017   TIBC 233 06/02/2017   IRONPCTSAT 19 (L) 06/02/2017   (Iron and TIBC)  Lab Results  Component Value Date   FERRITIN 308  08/26/2017         RADIOGRAPHIC STUDIES: I have personally reviewed the radiological images as listed and agreed with the findings in the  report.       Bone Marrow Biopsy 12/24/2016 (Accession [id number])  Diagnosis Bone Marrow, Aspirate,Biopsy, and Clot, left iliac crest - HYPERCELLULAR BONE MARROW FOR AGE WITH PLASMA CELL NEOPLASM. - SEE COMMENT. PERIPHERAL BLOOD: - NORMOCYTIC-NORMOCHROMIC ANEMIA. - LEUKOPENIA.  DG Bone Survey Met (Accession [id number]) (Order [id number])  Imaging  Date: 11/28/2016 Department: [facility]-RADIOLOGY[facility]STIC Released By: Pricilla Riffle Authorizing: Brunetta Genera, MD  Exam Information   Status Exam Begun  Exam Ended   Final [99] 11/28/2016 11:59 AM 11/28/2016 12:36 PM  PACS Images   Show images for DG Bone Survey Met  Study Result   CLINICAL DATA:  Monoclonal gammopathy of unknown significance. History of squamous cell carcinoma excision, basal cell carcinoma excision.  EXAM: METASTATIC BONE SURVEY  COMPARISON:  CT neck, chest, abdomen and pelvis from 10/29/16, MRI of the head from 06/01/2016, CXR 10/27/2016, lumbar spine radiographs 06/24/2016  FINDINGS: Lateral skull: Small occipital lucency which may represent a normal arachnoid granulation posteriorly in the occiput. No definite lytic abnormality.  Cervical spine AP and lateral: Disc space narrowing at C2-3, and from C4 through C7. C4-5, C5-6 and C6-7 uncovertebral joint spurring bilaterally. No lytic abnormality.  Thoracic spine AP and lateral: T8-9 right-sided osteophytes and to a lesser degree T9-10. No lytic abnormality. Slight multilevel lumbar thoracic disc space narrowing likely degenerative.  Lumbar spine AP and lateral: Disc space narrowing at L4-5 and to a greater extent L5-S1. No lytic disease. L3 through S1 facet sclerosis.  AP pelvis: Negative for lytic disease.  Bilateral upper and lower extremities shoulders through wrist and from the hips through ankle: Negative for lytic disease. Joint space narrowing of the femorotibial compartments both knees.  Subchondral cyst of the right patella.  CXR: Clear lungs. Cardiac implantable monitoring device projects over the left heart. Aortic atherosclerosis. No lytic disease.  IMPRESSION: No findings suspicious for lytic disease.   Electronically Signed   By: Ashley Royalty M.D.   On: 11/28/2016 14:58     ASSESSMENT & PLAN:   79 y.o. 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 (12/2016) Peak M spike 2.6  Cytogenetics and Myeloma FISH panel.- trisomy 11 Patient has normocytic anemia without any other clear etiology. (>2g/dl lower that lower limit of normal which is 13) which per criteria would place this in the Multiple myeloma category as opposed to Smoldering Multiple myeloma (Borderline criterion)  No overt renal failure or hypercalcemia at this time No focal bone pains though he has significant chronic back pain related to degenerative disc disease. Bone survey shows no overt lytic lesions.  10/16/18 CT Coronary Morph revealed Coronary artery calcium score 299 Agatston units, this places the patient in the 47th percentile for age and gender, suggesting intermediate risk for future cardiac events. 2. Nonobstructive coronary disease.  12/08/18 Bone Density study revealed that the pt has osteoporosis   07/05/2019 shows an M protein of 0.1.  2) Anemia and thrombocytopenia -- related to treatment (Revlimid) -stable  3) Grade 2 Neuropathy -- in b/l feet and halfway up lower leg without pain -Seconadry to [employer] -improved off [place]  5) Right kidney cyst with nodularity -Reviewed the 01/05/19 US Abdomen which revealed Septated cyst arising from lower pole right kidney with a  questionable solid focus within this lesion. This nodular area potentially could represent a small neoplasm developing within this complex cyst. Given this circumstance, pre and post contrast MR of the kidneys is advised to further  evaluate. If there is a contraindication to MR, pre and post-contrast CT would be a reasonable alternative to further assess. Study otherwise unremarkable. -Pt will likely need urology referral and MRI, pt would like to discuss this further with his PCP before triggering a referral at this point  6) Borderline low B12 levels, previously 299 --- on replacement -  -05/05/19 Vitamin B12 at 586 -continue replacement.   7) recurrent CVA - thought to be related to small vessel disease as per neurology. Has a small PFO which could be an additional risk factor as well as his afib  8)  P afib Plan -Continue on Xarelto per cardiology.  9) H/o recurrent SCC -Underwent surgical excision on right forearm with Dr. Romana Juniper on 07/28/18. His surgical pathology showed evidence of Squamous Cell Carcinoma. Plan -no evidence of recurrence locally at the site of surgery at this time. -continue dermatology followup  10) Lower extremity discomfort - improved, stable. Likely Discogenic pain -[place] on 12/2017 revealed no blood clot -Pt has established care with orthopedist and PT.  11) Patient Active Problem List   Diagnosis Date Noted  . Osteoporosis 03/10/2019  . Multiple myeloma without remission ([place]) 03/10/2019  . Chest pain 09/27/2018  . Multiple myeloma ([place]) 09/27/2018  . Degeneration of lumbar intervertebral disc 01/30/2018  . Low back pain 01/30/2018  . Lumbar radiculopathy 01/30/2018  . History of loop recorder 06/12/2017  . Cryptogenic stroke ([place]) 10/28/2016  . Gait abnormality   . History of CVA (cerebrovascular accident)   . History of TIA (transient ischemic attack)   . Benign essential HTN   . Paroxysmal SVT (supraventricular tachycardia) ([place])   . Acute blood loss anemia   . Acute ischemic stroke ([place])   . CVA (cerebral vascular accident) ([place]) 10/26/2016  . 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 History of recent stroke 06/06/2016  . 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 TIA  (transient ischemic attack) 06/02/2016  . Numbness 06/01/2016  . Right arm numbness 06/01/2016  . Atherosclerosis of native coronary artery of native heart without angina pectoris 03/20/2016  . Hypertension   . GERD (gastroesophageal reflux disease)   . Gout   . S/P RF ablation operation for arrhythmia 12/13/2014  . Hyperlipidemia 12/13/2014  . Controlled type 2 diabetes mellitus with complication, without long-term current use of insulin ([place]) 12/07/2014    PLAN: -Discussed pt labwork, 11/27/20; blood counts & chemistries are stable, Vitamin D looks good, MMP shows no M Protein observed but is IFE positive.  -No lab or clinical evidence of Multiple Myeloma progression at this time. Will continue to monitor. -The pt has no prohibitive toxicities from continuing 5 mg Revlimid 3 weeks on, 1 week off at this time. -Grade 1 diarrhea is not limiting at this time. -Continue Prolia q72month -Will see back in 10 weeks, with labs 2 weeks prior   FOLLOW UP: RTC with Dr KIrene Limboin 10 weeks (labs in 9 weeks - that is 1 week prior to clinic visit) Continue Prolia q6 months x 2   The total time spent in the appt was 20 minutes and more than 50% was on counseling and direct patient cares.  All of the patient's questions were answered with apparent satisfaction. The patient knows to call the clinic with any problems,  questions or concerns.   Sullivan Lone MD [place] AAHIVMS [facility] Hematology/Oncology Physician [facility]  (Office):       [phone] (Work cell):  [phone] (Fax):           [phone]  I, Yevette Edwards, am acting as a scribe for Dr. Sullivan Lone.   .I have reviewed the above documentation for accuracy and completeness, and I agree with the above. Brunetta Genera MD

## 2020-12-08 ENCOUNTER — Other Ambulatory Visit: Payer: Self-pay

## 2020-12-08 ENCOUNTER — Inpatient Hospital Stay (HOSPITAL_BASED_OUTPATIENT_CLINIC_OR_DEPARTMENT_OTHER): Payer: Medicare Other | Admitting: Hematology

## 2020-12-08 ENCOUNTER — Inpatient Hospital Stay: Payer: Medicare Other

## 2020-12-08 VITALS — BP 121/62 | HR 65 | Temp 99.5°F | Resp 18 | Ht 68.0 in | Wt 187.7 lb

## 2020-12-08 DIAGNOSIS — C9000 Multiple myeloma not having achieved remission: Secondary | ICD-10-CM

## 2020-12-08 DIAGNOSIS — I639 Cerebral infarction, unspecified: Secondary | ICD-10-CM

## 2020-12-08 DIAGNOSIS — C9001 Multiple myeloma in remission: Secondary | ICD-10-CM

## 2020-12-08 DIAGNOSIS — M810 Age-related osteoporosis without current pathological fracture: Secondary | ICD-10-CM

## 2020-12-08 DIAGNOSIS — M818 Other osteoporosis without current pathological fracture: Secondary | ICD-10-CM

## 2020-12-08 MED ORDER — DENOSUMAB 60 MG/ML ~~LOC~~ SOSY
60.0000 mg | PREFILLED_SYRINGE | Freq: Once | SUBCUTANEOUS | Status: AC
  Administered 2020-12-08: 60 mg via SUBCUTANEOUS

## 2020-12-08 MED ORDER — DENOSUMAB 60 MG/ML ~~LOC~~ SOSY
PREFILLED_SYRINGE | SUBCUTANEOUS | Status: AC
  Filled 2020-12-08: qty 1

## 2020-12-08 NOTE — Patient Instructions (Signed)
Denosumab injection What is this medicine? DENOSUMAB (den oh sue mab) slows bone breakdown. Prolia is used to treat osteoporosis in women after menopause and in men, and in people who are taking corticosteroids for 6 months or more. Xgeva is used to treat a high calcium level due to cancer and to prevent bone fractures and other bone problems caused by multiple myeloma or cancer bone metastases. Xgeva is also used to treat giant cell tumor of the bone. This medicine may be used for other purposes; ask your health care provider or pharmacist if you have questions. COMMON BRAND NAME(S): Prolia, XGEVA What should I tell my health care provider before I take this medicine? They need to know if you have any of these conditions:  dental disease  having surgery or tooth extraction  infection  kidney disease  low levels of calcium or Vitamin D in the blood  malnutrition  on hemodialysis  skin conditions or sensitivity  thyroid or parathyroid disease  an unusual reaction to denosumab, other medicines, foods, dyes, or preservatives  pregnant or trying to get pregnant  breast-feeding How should I use this medicine? This medicine is for injection under the skin. It is given by a health care professional in a hospital or clinic setting. A special MedGuide will be given to you before each treatment. Be sure to read this information carefully each time. For Prolia, talk to your pediatrician regarding the use of this medicine in children. Special care may be needed. For Xgeva, talk to your pediatrician regarding the use of this medicine in children. While this drug may be prescribed for children as young as 13 years for selected conditions, precautions do apply. Overdosage: If you think you have taken too much of this medicine contact a poison control center or emergency room at once. NOTE: This medicine is only for you. Do not share this medicine with others. What if I miss a dose? It is  important not to miss your dose. Call your doctor or health care professional if you are unable to keep an appointment. What may interact with this medicine? Do not take this medicine with any of the following medications:  other medicines containing denosumab This medicine may also interact with the following medications:  medicines that lower your chance of fighting infection  steroid medicines like prednisone or cortisone This list may not describe all possible interactions. Give your health care provider a list of all the medicines, herbs, non-prescription drugs, or dietary supplements you use. Also tell them if you smoke, drink alcohol, or use illegal drugs. Some items may interact with your medicine. What should I watch for while using this medicine? Visit your doctor or health care professional for regular checks on your progress. Your doctor or health care professional may order blood tests and other tests to see how you are doing. Call your doctor or health care professional for advice if you get a fever, chills or sore throat, or other symptoms of a cold or flu. Do not treat yourself. This drug may decrease your body's ability to fight infection. Try to avoid being around people who are sick. You should make sure you get enough calcium and vitamin D while you are taking this medicine, unless your doctor tells you not to. Discuss the foods you eat and the vitamins you take with your health care professional. See your dentist regularly. Brush and floss your teeth as directed. Before you have any dental work done, tell your dentist you are   receiving this medicine. Do not become pregnant while taking this medicine or for 5 months after stopping it. Talk with your doctor or health care professional about your birth control options while taking this medicine. Women should inform their doctor if they wish to become pregnant or think they might be pregnant. There is a potential for serious side  effects to an unborn child. Talk to your health care professional or pharmacist for more information. What side effects may I notice from receiving this medicine? Side effects that you should report to your doctor or health care professional as soon as possible:  allergic reactions like skin rash, itching or hives, swelling of the face, lips, or tongue  bone pain  breathing problems  dizziness  jaw pain, especially after dental work  redness, blistering, peeling of the skin  signs and symptoms of infection like fever or chills; cough; sore throat; pain or trouble passing urine  signs of low calcium like fast heartbeat, muscle cramps or muscle pain; pain, tingling, numbness in the hands or feet; seizures  unusual bleeding or bruising  unusually weak or tired Side effects that usually do not require medical attention (report to your doctor or health care professional if they continue or are bothersome):  constipation  diarrhea  headache  joint pain  loss of appetite  muscle pain  runny nose  tiredness  upset stomach This list may not describe all possible side effects. Call your doctor for medical advice about side effects. You may report side effects to FDA at 1-800-FDA-1088. Where should I keep my medicine? This medicine is only given in a clinic, doctor's office, or other health care setting and will not be stored at home. NOTE: This sheet is a summary. It may not cover all possible information. If you have questions about this medicine, talk to your doctor, pharmacist, or health care provider.  2020 Elsevier/Gold Standard (2018-04-17 16:10:44)

## 2020-12-29 ENCOUNTER — Other Ambulatory Visit: Payer: Self-pay | Admitting: *Deleted

## 2020-12-29 DIAGNOSIS — C9001 Multiple myeloma in remission: Secondary | ICD-10-CM

## 2020-12-29 MED ORDER — LENALIDOMIDE 5 MG PO CAPS: ORAL_CAPSULE | ORAL | 0 refills | Status: DC

## 2020-12-29 NOTE — Telephone Encounter (Signed)
Refilled Revlimid  5mg  21 days on/7 days off per Dr. Irene Limbo OV note 12/08/20 Escribed to Sharlene Dory, [place] Auth# [id number]; 12/29/20

## 2021-01-02 ENCOUNTER — Telehealth: Payer: Self-pay | Admitting: Internal Medicine

## 2021-01-02 NOTE — Telephone Encounter (Signed)
Patient is requesting a call back to discuss the purpose of the virtual appointment scheduled for 01/03/21. He would also like to know if it is too late to have orders for blood work placed prior to appointment. Please advise.

## 2021-01-02 NOTE — Telephone Encounter (Signed)
Spoke to patient advised he is scheduled for a mychart video visit with Dr.Hilty 01/03/21 at 4:00 pm.Advised he will receive a phone call about 30 min to 1 hour before appointment to enter weight,B/P,pulse and review medications.Advised to turn on mychart and he will be given directions.

## 2021-01-03 ENCOUNTER — Telehealth (INDEPENDENT_AMBULATORY_CARE_PROVIDER_SITE_OTHER): Payer: Medicare Other | Admitting: Internal Medicine

## 2021-01-03 ENCOUNTER — Encounter: Payer: Self-pay | Admitting: Internal Medicine

## 2021-01-03 VITALS — BP 139/54 | HR 58 | Ht 68.0 in | Wt 186.0 lb

## 2021-01-03 DIAGNOSIS — Z8673 Personal history of transient ischemic attack (TIA), and cerebral infarction without residual deficits: Secondary | ICD-10-CM | POA: Diagnosis not present

## 2021-01-03 DIAGNOSIS — E782 Mixed hyperlipidemia: Secondary | ICD-10-CM | POA: Diagnosis not present

## 2021-01-03 DIAGNOSIS — Z20822 Contact with and (suspected) exposure to covid-19: Secondary | ICD-10-CM | POA: Diagnosis not present

## 2021-01-03 DIAGNOSIS — I10 Essential (primary) hypertension: Secondary | ICD-10-CM | POA: Diagnosis not present

## 2021-01-03 DIAGNOSIS — I480 Paroxysmal atrial fibrillation: Secondary | ICD-10-CM | POA: Diagnosis not present

## 2021-01-03 NOTE — Patient Instructions (Signed)
Medication Instructions:  No Changes In Medications at this time.  *If you need a refill on your cardiac medications before your next appointment, please call your pharmacy*  Follow-Up: At [facility], you and your health needs are our priority.  As part of our continuing mission to provide you with exceptional heart care, we have created designated Provider Care Teams.  These Care Teams include your primary Cardiologist (physician) and Advanced Practice Providers (APPs -  Physician Assistants and Nurse Practitioners) who all work together to provide you with the care you need, when you need it.  Your next appointment:   1 year(s)  The format for your next appointment:   In Person  Provider:   K. Chad Hilty, MD 

## 2021-01-03 NOTE — Progress Notes (Signed)
Virtual Visit via Video Note   This visit type was conducted due to national recommendations for restrictions regarding the COVID-19 Pandemic (e.g. social distancing) in an effort to limit this patient's exposure and mitigate transmission in our community.  Due to his co-morbid illnesses, this patient is at least at moderate risk for complications without adequate follow up.  This format is felt to be most appropriate for this patient at this time.  All issues noted in this document were discussed and addressed.  A limited physical exam was performed with this format.  Please refer to the patient's chart for his consent to telehealth for [facility].    Date:  01/03/2021   ID:  Justin [name], DOB September 08, 1941, MRN [id number] The patient was identified using 2 identifiers.  Evaluation Performed:  Follow-Up Visit  Patient Location:  [place] [address]  Provider location:   [address] [place], [place] [address]  PCP:  Mayra Neer, MD  Cardiologist:  Pixie Casino, MD Electrophysiologist:  None   Chief Complaint:  No complaints  History of Present Illness:    Justin [name] is a 79 y.o. male who presents via audio/video conferencing for a telehealth visit today.  Justin [name] is a pleasant 80 year old male who I met recently in the hospital. He presented with chest pain that he developed while sitting his computer. The symptoms had some typical and atypical features for angina. He ruled out for MI and underwent nuclear stress testing which was negative for ischemia. During the stress test while exercising he developed an SVT with heart rate that shot up abruptly to 220. This responded to vagal maneuvers and then reoccurred and eventually subsided. He was started on low-dose beta blocker and eventually discharged without any recurrent symptoms. He was placed on a monitor and has follow-up with cardiac electrophysiology to see Dr. Lovena Le in January. His  main concern today is to when he can go back to flying duty. 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 He was started on Lipitor which was started the hospital for for coronary disease, however his LDL is only 95. He is a diabetic. His metformin was increased to 500 twice a day which is improved his blood sugars. He recently saw his primary care provider felt that he was doing well.  I saw Justin [name] back in the office today. He was referred to Dr. Cristopher Peru for SVT ablation consideration, and this was offered to him. Prior to having this scheduled he developed an episode of chest pain again and that brought him to the hospital yesterday. He was evaluated by Dr. Dorris Carnes, and had a right upper quadrant ultrasound which was unremarkable. He also had coronary CT angiogram which demonstrated mild to moderate coronary disease and an intermediate coronary calcium score of 202. He has subsequently worn a monitor between 12/21/2015 and 01/18/2015 which showed no recurrent SVT and normal sinus rhythm.  The etiology of his pain episodes is unclear however may be related to reflux.   Justin [name] returns today for follow-up. He reports he is done very well without any recurrent SVT status post catheter ablation by Dr. Cristopher Peru. He is seeing me primarily for risk factor modification and treatment of asymptomatic coronary artery disease which was seen by coronary artery CT angiography. He was found to have 2 areas of mild coronary artery disease and a calcium score which was moderately elevated  at 202. Since then he's been on lipid therapy with Lipitor and takes daily aspirin as well as my card is HCTZ for hypertension. Blood pressure is well-controlled today 106/60. He is due for repeat lipid profile and will need a refill on his cholesterol medication. He denies any chest pain or shortness of breath. He reports he still struggling with the [employer] to try to get his medical  certificate back.  06/06/2016  Justin [name] returns today for follow-up. He was seen by myself in the hospital after he presented with symptoms of possible TIA. This is following recent hospitalization at [facility] in [place] where he had a stroke. MRI confirmed this although he has had no significant lasting deficit. His initial symptoms were left arm heaviness and numbness. He also subsequently had some right arm tingling but the symptoms were different than his original presentation. This was thought to be TIA. He was evaluated by neurology however repeat imaging failed to show any new deficits. Subsequently he was referred for a TEE which I performed. This demonstrated a small bidirectional PFO. He had a bifid left atrial appendage without any thrombus. While he did have PFO, it's not clear that this was the etiology of his stroke or TIA. Venous Dopplers of the legs were negative for thrombus. He was changed from aspirin to Plavix. We also recommended and implanted loop recorder which was placed by Dr. Rayann Heman. The thought is that he may be having intermittent atrial fibrillation, as he has a history of SVT underwent ablation.  06/12/2017  Justin [name] returns today for follow-up. He's had a very eventful year. 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 He's had problems with recurrent skin cancer. He has had no further stroke or TIA and has done well on aspirin and Plavix. Recently his neurologist he discontinued Plavix and is currently only on full dose aspirin. He did have an implanted loop recorder and multiple interrogations have failed to show any recurrent or intermittent atrial fibrillation. He denies any recurrent SVT. This is not been noted as well. Blood pressure is well controlled and he said recent weight loss which is appropriate at this point.  10/01/2018  Justin [name] is seen today in follow-up.  Is been over a year since I saw him.  Unfortunately  was recently admitted with precordial chest pain last week.  He ruled out for MI and an outpatient echocardiogram was recommended.  Demonstrated LVEF 55 to 60%, mild LVH and grade 1 diastolic dysfunction.  No regional wall motion abnormalities.  Then he reports no recurrent chest pain.  He continues however to have some physical decline.  He was noted to be using a walker today which she did not last year.  Partially may be related to stroke, however previously he was flying airplanes 2 years ago.  Denies recurrent palpitations.  Loop recorder interrogation is failed to show recurrent arrhythmia.  11/04/2018  Justin [name] returns today for follow-up.  He underwent CT coronary angiography which showed no obstructive coronary disease and a mild increase in coronary calcium to 229 compared to study in 2016.  I do suspect his chest pain is noncardiac.  He actually now saying that he is having more belching and gas and discomfort after eating which sounds GI in nature.  He does not have a current gastroenterologist.  12/30/2019  [name] returns today for annual follow-up.  Earlier this morning he had a virtual visit with Dr. Rayann Heman.  This was for follow-up  of his loop recorder which is now at end of battery life.  Ultimately he will want it explanted.  He did not show any further events.  He had brief episodes of A. fib and has been anticoagulated for that but has not had recent recurrence.  Nonetheless since we will be monitoring him forward, I would recommend he remain on lifelong anticoagulation with Xarelto.  Overall he says he is doing much better.  He says he is in remission with regards to his multiple myeloma.  Cholesterols been well controlled with recent lipid profile showing total cholesterol 126, triglycerides 154, HDL 66 and LDL of 63.  He is interested in possibly getting back to flying and might consider the basic med program.  01/03/2021  Mr. Dolman is seen today in follow-up.  Overall he says he is  doing well.  He remains in remission with regards to multiple myeloma.  He denies any recurrent A. fib or SVT and had prior ablation.  His cholesterol has been well controlled with LDL now down in the 50s.  He did decrease his atorvastatin in half as well as a dose of his metformin.  Hemoglobin A1c recently was 6.6.  He has followed up with his PCP.  The patient does not have symptoms concerning for COVID-19 infection (fever, chills, cough, or new SHORTNESS OF BREATH).    Prior CV studies:   The following studies were reviewed today:  Chart reviewed  PMHx:  Past Medical History:  Diagnosis Date  . Anemia   . Arthritis   . 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 Diabetes mellitus without complication ([place])   . GERD (gastroesophageal reflux disease)   . Gout   . History of loop recorder   . Hypertension   . Left leg weakness   . Multiple myeloma ([place]) 09/27/2018  . Paroxysmal SVT (supraventricular tachycardia) ([place])    a. s/p RFCA on 05/01/15  . Stroke [facility]) 2017   TIA and mild stroke; denies stroke symptoms since then     Past Surgical History:  Procedure Laterality Date  . ATRIAL FIBRILLATION ABLATION     Dr. Lovena Le  . BASAL CELL CARCINOMA EXCISION     off of back  . ELECTROPHYSIOLOGIC STUDY N/A 05/01/2015   Procedure: SVT Ablation;  Surgeon: Evans Lance, MD;  Location: [facility];  Service: Cardiovascular;  Laterality: N/A;  . EP IMPLANTABLE DEVICE N/A 06/04/2016   Procedure: Loop Recorder Insertion;  Surgeon: Thompson Grayer, MD;  Location: [facility];  Service: Cardiovascular;  Laterality: N/A;  . INGUINAL HERNIA REPAIR Bilateral 06/16/2017   Procedure: LAPAROSCOPIC BILATERAL INGUINAL HERNIA REPAIR;  Surgeon: Clovis Riley, MD;  Location: [facility];  Service: General;  Laterality: Bilateral;  . INSERTION OF MESH Bilateral 06/16/2017   Procedure: INSERTION OF MESH;  Surgeon: Clovis Riley, MD;  Location: [facility];  Service: General;   Laterality: Bilateral;  . MASS EXCISION Right 07/28/2018   Procedure: EXCISION OF RIGHT FOREARM MASS;  Surgeon: Clovis Riley, MD;  Location: [facility];  Service: General;  Laterality: Right;  . SQUAMOUS CELL CARCINOMA EXCISION    . TEE WITHOUT CARDIOVERSION N/A 06/04/2016   Procedure: TRANSESOPHAGEAL ECHOCARDIOGRAM (TEE);  Surgeon: Pixie Casino, MD;  Location: [facility];  Service: Cardiovascular;  Laterality: N/A;    FAMHx:  Family History  Problem Relation Age of Onset  . Stroke Mother   . Hypertension Mother   .  Alzheimer's disease Father   . Heart failure Father   . Down syndrome Daughter     SOCHx:   reports that he has never smoked. He has never used smokeless tobacco. He reports that he does not drink alcohol and does not use drugs.  ALLERGIES:  Allergies  Allergen Reactions  . Other Other (See Comments)  . No Known Allergies     MEDS:  Current Meds  Medication Sig  . allopurinol (ZYLOPRIM) 100 MG tablet Take by mouth.  . Ascorbic Acid (VITAMIN C) 1000 MG tablet 1 tablet  . atorvastatin (LIPITOR) 10 MG tablet Take 10 mg by mouth daily.  . Coenzyme Q10 (CO Q-10) 100 MG CAPS See admin instructions.  . cyanocobalamin 1000 MCG tablet Take 1,000 mcg by mouth daily.  [name] Kitchen FREESTYLE LITE test strip   . glucose monitoring kit (FREESTYLE) monitoring kit 1 each by Does not apply route as needed for other.  . Lancets (FREESTYLE) lancets 1 each daily.  [name] Kitchen lenalidomide (REVLIMID) 5 MG capsule TAKE 1 CAPSULE DAILY FOR 21 DAYS ON AND 7 DAYS OFF, REPEAT EVERY 28 DAYS.  . Magnesium 400 MG TABS 1 tablet with a meal  . metFORMIN (GLUCOPHAGE) 1000 MG tablet Take 1 tablet (1,000 mg total) by mouth daily with breakfast. (Patient taking differently: Take 500 mg by mouth daily with breakfast.)  . nitroGLYCERIN (NITROSTAT) 0.4 MG SL tablet Place 1 tablet (0.4 mg total) under the tongue every 5 (five) minutes as needed for chest pain (Max 3 doses in 15 mins.).  [name] Kitchen omeprazole (PRILOSEC) 20 MG  capsule Take 20 mg by mouth every morning.   . Potassium 99 MG TABS 1 tablet  . pregabalin (LYRICA) 75 MG capsule Take 75 mg by mouth 3 (three) times daily.  . rivaroxaban (XARELTO) 20 MG TABS tablet TAKE 1 TABLET DAILY WITH SUPPER  . Vitamin D, Ergocalciferol, (DRISDOL) 1.25 MG (50000 UNIT) CAPS capsule TAKE 1 CAPSULE BY MOUTH 1 TIME A WEEK     ROS: Pertinent items noted in HPI and remainder of comprehensive ROS otherwise negative.  Labs/Other Tests and Data Reviewed:    Recent Labs: 11/27/2020: ALT 16; BUN 21; Creatinine 1.22; Hemoglobin 12.6; Platelets 126; Potassium 4.8; Sodium 140   Recent Lipid Panel Lab Results  Component Value Date/Time   CHOL 100 10/26/2016 04:23 AM   TRIG 74 10/26/2016 04:23 AM   HDL 30 (L) 10/26/2016 04:23 AM   CHOLHDL 3.3 10/26/2016 04:23 AM   LDLCALC 55 10/26/2016 04:23 AM    Wt Readings from Last 3 Encounters:  01/03/21 186 lb (84.4 kg)  12/08/20 187 lb 11.2 oz (85.1 kg)  09/07/20 186 lb 4.8 oz (84.5 kg)     Exam:    Vital Signs:  BP (!) 139/54   Pulse (!) 58   Ht 5' 8"  (1.727 m)   Wt 186 lb (84.4 kg)   BMI 28.28 kg/m    General appearance: alert and no distress Lungs: No visual respiratory difficulty Abdomen: Overweight Extremities: extremities normal, atraumatic, no cyanosis or edema Skin: Skin color, texture, turgor normal. No rashes or lesions Neurologic: Grossly normal Psych: Pleasant  ASSESSMENT & PLAN:    1. Chest pain -nonobstructive coronary artery disease by cardiac CT, calcium score 229 (09/2018) 2. Stroke/TIA 3. PAF - brief, found on ILR - on Xarelto 4. Small bidirectional PFO by TEE -on long-term Xarelto 5. Mild CAD - coronary CT angiogram showed mild to moderate coronary disease which was nonobstructive and an intermediate coronary calcium score  of 202 (2016) 6. PSVT s/p ablation by Dr. Lovena Le 7. Diabetes type 2 -controlled 8. Relative dyslipidemia 9. Multiple myeloma -in remission 10. Skin cancer  Mr. Enck  seems to be doing well without recurrent multiple myeloma.  He denies any chest pain or worsening shortness of breath.  He has had no further stroke or TIA and remains on Xarelto after having found a brief period of A. fib on his loop recorder.  He had prior SVT status post ablation.  His type 2 diabetes is controlled with A1c of 6.6.  LDL is less than 70.  No medication changes were made today.  Plan follow-up annually or sooner as necessary.  COVID-19 Education: The signs and symptoms of COVID-19 were discussed with the patient and how to seek care for testing (follow up with PCP or arrange E-visit).  The importance of social distancing was discussed today.  Patient Risk:   After full review of this patients clinical status, I feel that they are at least moderate risk at this time.  Time:   Today, I have spent 25 minutes with the patient with telehealth technology discussing dyslipidemia, CAD, PAF, DM2, multiple myeloma.     Medication Adjustments/Labs and Tests Ordered: Current medicines are reviewed at length with the patient today.  Concerns regarding medicines are outlined above.   Tests Ordered: No orders of the defined types were placed in this encounter.   Medication Changes: No orders of the defined types were placed in this encounter.   Disposition:  in 1 year(s)  Pixie Casino, MD, [facility], [place] Director of the [facility] Lipid [facility]  Card[facility]ovascular Risk Reduction [facility] Diplomate of the American Board of Clinical Lipidology Attending Cardiologist  Direct Dial: [phone]  Fax: [phone]  Website:  [email or web address]  Pixie Casino, MD  01/03/2021 4:27 PM

## 2021-01-04 DIAGNOSIS — H2512 Age-related nuclear cataract, left eye: Secondary | ICD-10-CM | POA: Diagnosis not present

## 2021-01-04 DIAGNOSIS — H25012 Cortical age-related cataract, left eye: Secondary | ICD-10-CM | POA: Diagnosis not present

## 2021-01-04 DIAGNOSIS — H25812 Combined forms of age-related cataract, left eye: Secondary | ICD-10-CM | POA: Diagnosis not present

## 2021-01-17 DIAGNOSIS — L02229 Furuncle of trunk, unspecified: Secondary | ICD-10-CM | POA: Diagnosis not present

## 2021-01-17 DIAGNOSIS — M546 Pain in thoracic spine: Secondary | ICD-10-CM | POA: Diagnosis not present

## 2021-01-30 ENCOUNTER — Other Ambulatory Visit: Payer: Self-pay | Admitting: Hematology

## 2021-01-30 DIAGNOSIS — C9001 Multiple myeloma in remission: Secondary | ICD-10-CM

## 2021-02-01 ENCOUNTER — Telehealth: Payer: Self-pay | Admitting: Hematology

## 2021-02-01 NOTE — Telephone Encounter (Signed)
Patient called to reschedule lab appointment to later in the day. Rescheduled appointment per patient's wishes. Patient is aware of changes.

## 2021-02-02 ENCOUNTER — Other Ambulatory Visit: Payer: Self-pay

## 2021-02-02 ENCOUNTER — Inpatient Hospital Stay: Payer: Medicare Other | Attending: Hematology

## 2021-02-02 ENCOUNTER — Inpatient Hospital Stay: Payer: Medicare Other

## 2021-02-02 DIAGNOSIS — Z7901 Long term (current) use of anticoagulants: Secondary | ICD-10-CM | POA: Insufficient documentation

## 2021-02-02 DIAGNOSIS — Z85828 Personal history of other malignant neoplasm of skin: Secondary | ICD-10-CM | POA: Diagnosis not present

## 2021-02-02 DIAGNOSIS — Z8673 Personal history of transient ischemic attack (TIA), and cerebral infarction without residual deficits: Secondary | ICD-10-CM | POA: Insufficient documentation

## 2021-02-02 DIAGNOSIS — C9000 Multiple myeloma not having achieved remission: Secondary | ICD-10-CM | POA: Insufficient documentation

## 2021-02-02 DIAGNOSIS — I480 Paroxysmal atrial fibrillation: Secondary | ICD-10-CM | POA: Insufficient documentation

## 2021-02-02 DIAGNOSIS — L723 Sebaceous cyst: Secondary | ICD-10-CM | POA: Insufficient documentation

## 2021-02-02 DIAGNOSIS — N281 Cyst of kidney, acquired: Secondary | ICD-10-CM | POA: Insufficient documentation

## 2021-02-02 DIAGNOSIS — C9001 Multiple myeloma in remission: Secondary | ICD-10-CM

## 2021-02-02 DIAGNOSIS — Z79899 Other long term (current) drug therapy: Secondary | ICD-10-CM | POA: Insufficient documentation

## 2021-02-02 DIAGNOSIS — D649 Anemia, unspecified: Secondary | ICD-10-CM | POA: Diagnosis not present

## 2021-02-02 DIAGNOSIS — D696 Thrombocytopenia, unspecified: Secondary | ICD-10-CM | POA: Diagnosis not present

## 2021-02-02 DIAGNOSIS — G620 Drug-induced polyneuropathy: Secondary | ICD-10-CM | POA: Diagnosis not present

## 2021-02-02 DIAGNOSIS — M810 Age-related osteoporosis without current pathological fracture: Secondary | ICD-10-CM

## 2021-02-02 LAB — CBC WITH DIFFERENTIAL/PLATELET
Abs Immature Granulocytes: 0.01 10*3/uL (ref 0.00–0.07)
Basophils Absolute: 0 10*3/uL (ref 0.0–0.1)
Basophils Relative: 2 %
Eosinophils Absolute: 0.2 10*3/uL (ref 0.0–0.5)
Eosinophils Relative: 9 %
HCT: 37.3 % — ABNORMAL LOW (ref 39.0–52.0)
Hemoglobin: 11.7 g/dL — ABNORMAL LOW (ref 13.0–17.0)
Immature Granulocytes: 0 %
Lymphocytes Relative: 25 %
Lymphs Abs: 0.6 10*3/uL — ABNORMAL LOW (ref 0.7–4.0)
MCH: 29.3 pg (ref 26.0–34.0)
MCHC: 31.4 g/dL (ref 30.0–36.0)
MCV: 93.3 fL (ref 80.0–100.0)
Monocytes Absolute: 0.4 10*3/uL (ref 0.1–1.0)
Monocytes Relative: 14 %
Neutro Abs: 1.3 10*3/uL — ABNORMAL LOW (ref 1.7–7.7)
Neutrophils Relative %: 50 %
Platelets: 110 10*3/uL — ABNORMAL LOW (ref 150–400)
RBC: 4 MIL/uL — ABNORMAL LOW (ref 4.22–5.81)
RDW: 15.1 % (ref 11.5–15.5)
WBC: 2.5 10*3/uL — ABNORMAL LOW (ref 4.0–10.5)
nRBC: 0 % (ref 0.0–0.2)

## 2021-02-02 LAB — CMP (CANCER CENTER ONLY)
ALT: 14 U/L (ref 0–44)
AST: 26 U/L (ref 15–41)
Albumin: 3.9 g/dL (ref 3.5–5.0)
Alkaline Phosphatase: 69 U/L (ref 38–126)
Anion gap: 3 — ABNORMAL LOW (ref 5–15)
BUN: 32 mg/dL — ABNORMAL HIGH (ref 8–23)
CO2: 24 mmol/L (ref 22–32)
Calcium: 8.6 mg/dL — ABNORMAL LOW (ref 8.9–10.3)
Chloride: 109 mmol/L (ref 98–111)
Creatinine: 1.75 mg/dL — ABNORMAL HIGH (ref 0.61–1.24)
GFR, Estimated: 39 mL/min — ABNORMAL LOW (ref >60)
Glucose, Bld: 117 mg/dL — ABNORMAL HIGH (ref 70–99)
Potassium: 5.5 mmol/L — ABNORMAL HIGH (ref 3.5–5.1)
Sodium: 136 mmol/L (ref 135–145)
Total Bilirubin: 0.6 mg/dL (ref 0.3–1.2)
Total Protein: 6.7 g/dL (ref 6.5–8.1)

## 2021-02-05 LAB — MULTIPLE MYELOMA PANEL, SERUM
Albumin SerPl Elph-Mcnc: 3.6 g/dL (ref 2.9–4.4)
Albumin/Glob SerPl: 1.4 (ref 0.7–1.7)
Alpha 1: 0.2 g/dL (ref 0.0–0.4)
Alpha2 Glob SerPl Elph-Mcnc: 0.6 g/dL (ref 0.4–1.0)
B-Globulin SerPl Elph-Mcnc: 1 g/dL (ref 0.7–1.3)
Gamma Glob SerPl Elph-Mcnc: 0.8 g/dL (ref 0.4–1.8)
Globulin, Total: 2.7 g/dL (ref 2.2–3.9)
IgA: 133 mg/dL (ref 61–437)
IgG (Immunoglobin G), Serum: 889 mg/dL (ref 603–1613)
IgM (Immunoglobulin M), Srm: 26 mg/dL (ref 15–143)
Total Protein ELP: 6.3 g/dL (ref 6.0–8.5)

## 2021-02-08 NOTE — Progress Notes (Signed)
HEMATOLOGY/ONCOLOGY CLINIC NOTE  Date of Service: 02/08/2021   Patient Care Team: Mayra Neer, MD as PCP - General (Family Medicine) Debara Pickett Nadean Corwin, MD as PCP - Cardiology (Cardiology)   CHIEF COMPLAINTS/:  F/u for continued mx of Myeloma   HISTORY OF PRESENTING ILLNESS:  Justin [name] is a wonderful 80 y.o. 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  Patient has a history of hypertension, diabetes, GERD, gout, squamous cell skin cancers, elevated PSA, SVT status post ablation in May 2016, chronic back pain who has a history of recurrent CVA/TIA's. He apparently had a left hemispheric TIA June 2017 and has had recurrent) subcortical infarcts thought to be related to small vessel disease. He was noted to have a small PFO that was of questionable significance. He was initially on aspirin and then continued and aspirin + plavix for secondary stroke prevention. He follows with Dr. Antony Contras for his neurology cares.  An SPEP was done due to elevated total proteins of 8.9 and showed an M spike of 2 g/dL. He was also noted to have an elevated total protein of 8.6 in April 2017. He was referred to [place] for further evaluation of his M spike. Blood test did not reveal any hypercalcemia, no significant renal failure. He has been noted to have developed anemia since June the serum and his hemoglobin was 12.6 and is now down to the mid 10 range. He notes no focal bone pains at this time. Overt acute weight loss. No fevers no chills no night sweats.   CURRENT THERAPY:   Revlimid maintenance 5 mg po 3 weeks on 1 week off.   INTERVAL HISTORY:  Justin [name] was seen today for follow-up of his myeloma. The patient's last visit with [place] was on 12/08/2020. The pt reports that he is doing well overall.  The pt reports no new concerns or symptoms. He notes some medication changes, but denies any diuretics or water pills. The pt  notes he is taking a potassium pill daily. The pt notes some continued muscle cramps. The pt denies any prostate issues and notes no issues with passing urine and emptying his bladder. The pt notes he has a growth on his back and his PCP's PA had him taking an antibiotic for this. He denies seeing his dermatologist for this. He started the antibiotic 2-3 weeks ago, but does not remember what the name is. The pt notes he has a history of sebaceous cysts, including removal of one a few weeks ago on his right cheek.  Lab results 02/02/2021 of CBC w/diff and CMP is as follows: all values are WNL except for Potassium of 5.5, Glucose of 117, BUN of 32, Creatinine of 1.75, Calcium of 8.6, GFR est of 39, Anion gap of 3, WBC of 2.5K, RBC of 4.00, Hgb of 11.7, HCT of 37.3, Plt of 110K, Neutro Abs of 1.3K, Lymphs Abs of 0.6K. 02/02/2021 MMP all WNL.  On review of systems, pt reports infected abscess on back, intermittent chronic muscle cramps and denies abdominal pain, problems passing urine, prostate issues, and any other symptoms.  MEDICAL HISTORY:  Past Medical History:  Diagnosis Date  . Anemia   . Arthritis   . 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 Diabetes mellitus without complication ([place])   . GERD (gastroesophageal reflux disease)   . Gout   . History  of loop recorder   . Hypertension   . Left leg weakness   . Multiple myeloma ([place]) 09/27/2018  . Paroxysmal SVT (supraventricular tachycardia) ([place])    a. s/p RFCA on 05/01/15  . Stroke [facility]) 2017   TIA and mild stroke; denies stroke symptoms since then     SURGICAL HISTORY: Past Surgical History:  Procedure Laterality Date  . ATRIAL FIBRILLATION ABLATION     Dr. Lovena Le  . BASAL CELL CARCINOMA EXCISION     off of back  . ELECTROPHYSIOLOGIC STUDY N/A 05/01/2015   Procedure: SVT Ablation;  Surgeon: Evans Lance, MD;  Location: [place] [facility];  Service: Cardiovascular;  Laterality: N/A;  . EP IMPLANTABLE  DEVICE N/A 06/04/2016   Procedure: Loop Recorder Insertion;  Surgeon: Thompson Grayer, MD;  Location: [place] [facility];  Service: Cardiovascular;  Laterality: N/A;  . INGUINAL HERNIA REPAIR Bilateral 06/16/2017   Procedure: LAPAROSCOPIC BILATERAL INGUINAL HERNIA REPAIR;  Surgeon: Clovis Riley, MD;  Location: [place];  Service: General;  Laterality: Bilateral;  . INSERTION OF MESH Bilateral 06/16/2017   Procedure: INSERTION OF MESH;  Surgeon: Clovis Riley, MD;  Location: [place];  Service: General;  Laterality: Bilateral;  . MASS EXCISION Right 07/28/2018   Procedure: EXCISION OF RIGHT FOREARM MASS;  Surgeon: Clovis Riley, MD;  Location: [facility];  Service: General;  Laterality: Right;  . SQUAMOUS CELL CARCINOMA EXCISION    . TEE WITHOUT CARDIOVERSION N/A 06/04/2016   Procedure: TRANSESOPHAGEAL ECHOCARDIOGRAM (TEE);  Surgeon: Pixie Casino, MD;  Location: [facility];  Service: Cardiovascular;  Laterality: N/A;    SOCIAL HISTORY: Social History   Socioeconomic History  . Marital status: Married    Spouse name: Not on file  . Number of children: Not on file  . Years of education: Not on file  . Highest education level: Not on file  Occupational History  . Not on file  Tobacco Use  . Smoking status: Never Smoker  . Smokeless tobacco: Never Used  Vaping Use  . Vaping Use: Never used  Substance and Sexual Activity  . Alcohol use: No  . Drug use: No  . Sexual activity: Not on file  Other Topics Concern  . Not on file  Social History Narrative  . Not on file   Social Determinants of Health   Financial Resource Strain: Not on file  Food Insecurity: Not on file  Transportation Needs: Not on file  Physical Activity: Not on file  Stress: Not on file  Social Connections: Not on file  Intimate Partner Violence: Not on file   Justin [name] is retired. His daughter is Mudlogger of nursing for a nursing home in [place], [place].    FAMILY HISTORY: Family History  Problem Relation  Age of Onset  . Stroke Mother   . Hypertension Mother   . Alzheimer's disease Father   . Heart failure Father   . Down syndrome Daughter     ALLERGIES:  is allergic to other and no known allergies.  MEDICATIONS:  Current Outpatient Medications  Medication Sig Dispense Refill  . allopurinol (ZYLOPRIM) 100 MG tablet Take by mouth.    . Ascorbic Acid (VITAMIN C) 1000 MG tablet 1 tablet    . atorvastatin (LIPITOR) 10 MG tablet Take 10 mg by mouth daily.    . Coenzyme Q10 (CO Q-10) 100 MG CAPS See admin instructions.    . cyanocobalamin 1000 MCG tablet Take 1,000 mcg by mouth daily.    [name] Kitchen  Ferrous Sulfate (IRON) 90 (18 Fe) MG TABS Take by mouth. (Patient not taking: Reported on 01/03/2021)    . FREESTYLE LITE test strip     . glucose monitoring kit (FREESTYLE) monitoring kit 1 each by Does not apply route as needed for other.    . Lancets (FREESTYLE) lancets 1 each daily.    [name] Kitchen lenalidomide (REVLIMID) 5 MG capsule TAKE 1 CAPSULE DAILY FOR 21 DAYS ON AND 7 DAYS OFF, REPEAT EVERY 28 DAYS 21 capsule 0  . Magnesium 400 MG TABS 1 tablet with a meal    . metFORMIN (GLUCOPHAGE) 1000 MG tablet Take 1 tablet (1,000 mg total) by mouth daily with breakfast. (Patient taking differently: Take 500 mg by mouth daily with breakfast.) 90 tablet 3  . nitroGLYCERIN (NITROSTAT) 0.4 MG SL tablet Place 1 tablet (0.4 mg total) under the tongue every 5 (five) minutes as needed for chest pain (Max 3 doses in 15 mins.). 30 tablet 0  . omeprazole (PRILOSEC) 20 MG capsule Take 20 mg by mouth every morning.     . Potassium 99 MG TABS 1 tablet    . pregabalin (LYRICA) 75 MG capsule Take 75 mg by mouth 3 (three) times daily.    . rivaroxaban (XARELTO) 20 MG TABS tablet TAKE 1 TABLET DAILY WITH SUPPER 90 tablet 1  . Vitamin D, Ergocalciferol, (DRISDOL) 1.25 MG (50000 UNIT) CAPS capsule TAKE 1 CAPSULE BY MOUTH 1 TIME A WEEK 12 capsule 3   No current facility-administered medications for this visit.    REVIEW OF SYSTEMS:    10 Point review of Systems was done is negative except as noted above.   PHYSICAL EXAMINATION:   ECOG PERFORMANCE STATUS: 2 - Symptomatic, <50% confined to bed Vitals:   02/09/21 1034  BP: (!) 117/58  Pulse: (!) 59  Resp: 18  Temp: (!) 97 F (36.1 C)  SpO2: 92%    Wt Readings from Last 3 Encounters:  01/03/21 186 lb (84.4 kg)  12/08/20 187 lb 11.2 oz (85.1 kg)  09/07/20 186 lb 4.8 oz (84.5 kg)   Body mass index is 28.74 kg/m.   GENERAL:alert, in no acute distress and comfortable SKIN: no acute rashes, no significant lesions. Infected sebaceous cyst on back that is actively draining.  EYES: conjunctiva are pink and non-injected, sclera anicteric OROPHARYNX: MMM, no exudates, no oropharyngeal erythema or ulceration NECK: supple, no JVD LYMPH:  no palpable lymphadenopathy in the cervical, axillary or inguinal regions LUNGS: clear to auscultation b/l with normal respiratory effort HEART: regular rate & rhythm ABDOMEN:  normoactive bowel sounds , non tender, not distended. Extremity: no pedal edema PSYCH: alert & oriented x 3 with fluent speech NEURO: no focal motor/sensory deficit   LABORATORY DATA:  I have reviewed the data as listed. CBC Latest Ref Rng & Units 02/02/2021 11/27/2020 08/31/2020  WBC 4.0 - 10.5 K/uL 2.5(L) 3.7(L) 2.2(L)  Hemoglobin 13.0 - 17.0 g/dL 11.7(L) 12.6(L) 12.0(L)  Hematocrit 39.0 - 52.0 % 37.3(L) 37.5(L) 36.6(L)  Platelets 150 - 400 K/uL 110(L) 126(L) 112(L)  ANC 1k  CMP Latest Ref Rng & Units 02/02/2021 11/27/2020 08/31/2020  Glucose 70 - 99 mg/dL 117(H) 124(H) 106(H)  BUN 8 - 23 mg/dL 32(H) 21 18  Creatinine 0.61 - 1.24 mg/dL 1.75(H) 1.22 1.10  Sodium 135 - 145 mmol/L 136 140 141  Potassium 3.5 - 5.1 mmol/L 5.5(H) 4.8 4.3  Chloride 98 - 111 mmol/L 109 104 111  CO2 22 - 32 mmol/L _0 Calcium 8.9 -  10.3 mg/dL 8.6(L) 9.3 8.3(L)  Total Protein 6.5 - 8.1 g/dL 6.7 6.7 6.1(L)  Total Bilirubin 0.3 - 1.2 mg/dL 0.6 0.6 0.6  Alkaline Phos 38 - 126  U/L 69 64 65  AST 15 - 41 U/L _0 ALT 0 - 44 U/L _1 Lab Results  Component Value Date   IRON 45 06/02/2017   TIBC 233 06/02/2017   IRONPCTSAT 19 (L) 06/02/2017   (Iron and TIBC)  Lab Results  Component Value Date   FERRITIN 308 08/26/2017         RADIOGRAPHIC STUDIES: I have personally reviewed the radiological images as listed and agreed with the findings in the report.       Bone Marrow Biopsy 12/24/2016 (Accession [id number])  Diagnosis Bone Marrow, Aspirate,Biopsy, and Clot, left iliac crest - HYPERCELLULAR BONE MARROW FOR AGE WITH PLASMA CELL NEOPLASM. - SEE COMMENT. PERIPHERAL BLOOD: - NORMOCYTIC-NORMOCHROMIC ANEMIA. - LEUKOPENIA.  DG Bone Survey Met (Accession [id number]) (Order [id number])  Imaging  Date: 11/28/2016 Department: [facility] Released By: Pricilla Riffle Authorizing: Brunetta Genera, MD  Exam Information   Status Exam Begun  Exam Ended   Final [99] 11/28/2016 11:59 AM 11/28/2016 12:36 PM  PACS Images   Show images for DG Bone Survey Met  Study Result   CLINICAL DATA:  Monoclonal gammopathy of unknown significance. History of squamous cell carcinoma excision, basal cell carcinoma excision.  EXAM: METASTATIC BONE SURVEY  COMPARISON:  CT neck, chest, abdomen and pelvis from 10/29/16, MRI of the head from 06/01/2016, CXR 10/27/2016, lumbar spine radiographs 06/24/2016  FINDINGS: Lateral skull: Small occipital lucency which may represent a normal arachnoid granulation posteriorly in the occiput. No definite lytic abnormality.  Cervical spine AP and lateral: Disc space narrowing at C2-3, and from C4 through C7. C4-5, C5-6 and C6-7 uncovertebral joint spurring bilaterally. No lytic abnormality.  Thoracic spine AP and lateral: T8-9 right-sided osteophytes and to a lesser degree T9-10. No lytic abnormality. Slight multilevel lumbar thoracic disc space narrowing likely  degenerative.  Lumbar spine AP and lateral: Disc space narrowing at L4-5 and to a greater extent L5-S1. No lytic disease. L3 through S1 facet sclerosis.  AP pelvis: Negative for lytic disease.  Bilateral upper and lower extremities shoulders through wrist and from the hips through ankle: Negative for lytic disease. Joint space narrowing of the femorotibial compartments both knees. Subchondral cyst of the right patella.  CXR: Clear lungs. Cardiac implantable monitoring device projects over the left heart. Aortic atherosclerosis. No lytic disease.  IMPRESSION: No findings suspicious for lytic disease.   Electronically Signed   By: Ashley Royalty M.D.   On: 11/28/2016 14:58     ASSESSMENT & PLAN:   80 y.o. 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 (12/2016) Peak M spike 2.6  Cytogenetics and Myeloma FISH panel.- trisomy 11 Patient has normocytic anemia without any other clear etiology. (>2g/dl lower that lower limit of normal which is 13) which per criteria would place this in the Multiple myeloma category as opposed to Smoldering Multiple myeloma (Borderline criterion)  No overt renal failure or hypercalcemia at this time No focal bone pains though he has significant chronic back pain related to degenerative disc disease. Bone survey shows no overt lytic lesions.  10/16/18 CT Coronary Morph revealed Coronary artery calcium score 299 Agatston units, this places the patient in the  47th percentile for age and gender, suggesting intermediate risk for future cardiac events. 2. Nonobstructive coronary disease.  12/08/18 Bone Density study revealed that the pt has osteoporosis   07/05/2019 shows an M protein of 0.1.  2) Anemia and thrombocytopenia -- related to treatment (Revlimid) -stable  3) Grade 2 Neuropathy -- in b/l feet and halfway up lower leg without pain -Seconadry to [employer] -improved  off [place]  5) Right kidney cyst with nodularity -Reviewed the 01/05/19 US Abdomen which revealed Septated cyst arising from lower pole right kidney with a questionable solid focus within this lesion. This nodular area potentially could represent a small neoplasm developing within this complex cyst. Given this circumstance, pre and post contrast MR of the kidneys is advised to further evaluate. If there is a contraindication to MR, pre and post-contrast CT would be a reasonable alternative to further assess. Study otherwise unremarkable. -Pt will likely need urology referral and MRI, pt would like to discuss this further with his PCP before triggering a referral at this point  6) Borderline low B12 levels, previously 299 --- on replacement -  -05/05/19 Vitamin B12 at 586 -continue replacement.   7) recurrent CVA - thought to be related to small vessel disease as per neurology. Has a small PFO which could be an additional risk factor as well as his afib  8)  P afib Plan -Continue on Xarelto per cardiology.  9) H/o recurrent SCC -Underwent surgical excision on right forearm with Dr. Romana Juniper on 07/28/18. His surgical pathology showed evidence of Squamous Cell Carcinoma. Plan -no evidence of recurrence locally at the site of surgery at this time. -continue dermatology followup  10) Lower extremity discomfort - improved, stable. Likely Discogenic pain -[place] on 12/2017 revealed no blood clot -Pt has established care with orthopedist and PT.  11) Patient Active Problem List   Diagnosis Date Noted  . Osteoporosis 03/10/2019  . Multiple myeloma without remission ([place]) 03/10/2019  . Chest pain 09/27/2018  . Multiple myeloma ([place]) 09/27/2018  . Degeneration of lumbar intervertebral disc 01/30/2018  . Low back pain 01/30/2018  . Lumbar radiculopathy 01/30/2018  . History of loop recorder 06/12/2017  . Cryptogenic stroke ([place]) 10/28/2016  . Gait abnormality   . History of CVA  (cerebrovascular accident)   . History of TIA (transient ischemic attack)   . Benign essential HTN   . Paroxysmal SVT (supraventricular tachycardia) ([place])   . Acute blood loss anemia   . Acute ischemic stroke ([place])   . CVA (cerebral vascular accident) ([place]) 10/26/2016  . 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 History of recent stroke 06/06/2016  . 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 TIA (transient ischemic attack) 06/02/2016  . Numbness 06/01/2016  . Right arm numbness 06/01/2016  . Atherosclerosis of native coronary artery of native heart without angina pectoris 03/20/2016  . Hypertension   . GERD (gastroesophageal reflux disease)   . Gout   . S/P RF ablation operation for arrhythmia 12/13/2014  . Hyperlipidemia 12/13/2014  . Controlled type 2 diabetes mellitus with complication, without long-term current use of insulin ([place]) 12/07/2014    PLAN: -Discussed pt labwork, 02/02/2021; Potassium elevated, kidney numbers elevated suggestive of dehydration. Blood counts normal. MMP still shows undetectable. -Advised pt that he continues to be in remission. -Advised pt that kidney numbers from labs suggest element of dehydration. Recommended pt increase water intake to 2L daily. -Advised pt to hold potassium supplementation at this time due to elevated numbers. -Advised pt that Co-Q-10  is acceptable to take.  -Discussed [place] and pt's eligibility. Will give handout for that today. -Advised pt that immune system is slightly suppressed due to Revlimid and lower WBC count. Advised Revlimid is not as suppressive as some, but pt still would be considered immunosuppressed. -Advised pt that he has an abscess on his back that is actively draining lots of pus and infected. F/u w PCP ASAP. Recommended pt contact PCP for surgical referral.  -Drained 15-20 cc of active pus from infected sebaceous cyst of back.  -No lab or clinical evidence of Multiple Myeloma progression at this time. Will continue to  monitor. -The pt has no prohibitive toxicities from continuing 5 mg Revlimid 3 weeks on, 1 week off at this time. -Recommended pt increase water intake, hold potassium, and re-check labs w PCP in 2 weeks. Drink at least 48 oz water daily. -Continue Prolia q27month -Will hold Revlimid for 2 weeks due to pt's infected sebaceous cysts improves.  -Will see back in 2 months with labs. -Rx Mupirocin topical ointment.   FOLLOW UP: -F/u with primary care physician Dr. SBrigitte Pulsein 2 weeks for repeat labs to monitor kidney function and potassium and resolution of his infected sebaceous cyst. -We will defer to Dr. SBrigitte Pulseabout surgical referral for resection of sebaceous cyst on back. -Return to clinic with Dr. KIrene Limboin 2 months with labs 1 week prior to clinic visit   The total time spent in the appointment was 40 minutes and more than 50% was on counseling and direct patient cares.  All of the patient's questions were answered with apparent satisfaction. The patient knows to call the clinic with any problems, questions or concerns.   GSullivan LoneMD MCamp HillAAHIVMS SNeuropsychiatric Hospital Of Indianapolis, LLCCPunxsutawney Area HospitalHematology/Oncology Physician CNatchaug Hospital, Inc. (Office):       [phone](Work cell):  [phone](Fax):           [phone] I, RReinaldo Raddle am acting as scribe for Dr. GSullivan Lone MD.     .I have reviewed the above documentation for accuracy and completeness, and I agree with the above. .Brunetta GeneraMD

## 2021-02-09 ENCOUNTER — Other Ambulatory Visit: Payer: Self-pay

## 2021-02-09 ENCOUNTER — Telehealth: Payer: Self-pay

## 2021-02-09 ENCOUNTER — Inpatient Hospital Stay (HOSPITAL_BASED_OUTPATIENT_CLINIC_OR_DEPARTMENT_OTHER): Payer: Medicare Other | Admitting: Hematology

## 2021-02-09 ENCOUNTER — Ambulatory Visit: Payer: Medicare Other

## 2021-02-09 VITALS — BP 117/58 | HR 59 | Temp 97.0°F | Resp 18 | Ht 68.0 in | Wt 189.0 lb

## 2021-02-09 DIAGNOSIS — G620 Drug-induced polyneuropathy: Secondary | ICD-10-CM | POA: Diagnosis not present

## 2021-02-09 DIAGNOSIS — M810 Age-related osteoporosis without current pathological fracture: Secondary | ICD-10-CM

## 2021-02-09 DIAGNOSIS — C9001 Multiple myeloma in remission: Secondary | ICD-10-CM | POA: Diagnosis not present

## 2021-02-09 DIAGNOSIS — C9000 Multiple myeloma not having achieved remission: Secondary | ICD-10-CM | POA: Diagnosis not present

## 2021-02-09 DIAGNOSIS — D696 Thrombocytopenia, unspecified: Secondary | ICD-10-CM | POA: Diagnosis not present

## 2021-02-09 DIAGNOSIS — N281 Cyst of kidney, acquired: Secondary | ICD-10-CM | POA: Diagnosis not present

## 2021-02-09 DIAGNOSIS — D649 Anemia, unspecified: Secondary | ICD-10-CM | POA: Diagnosis not present

## 2021-02-09 DIAGNOSIS — Z85828 Personal history of other malignant neoplasm of skin: Secondary | ICD-10-CM | POA: Diagnosis not present

## 2021-02-09 MED ORDER — MUPIROCIN CALCIUM 2 % EX CREA: 1.0000 "application " | TOPICAL_CREAM | Freq: Two times a day (BID) | CUTANEOUS | 1 refills | Status: DC

## 2021-02-09 NOTE — Telephone Encounter (Signed)
Oral Oncology Patient Advocate Encounter  Received notification from [employer] that prior authorization for Revlimid is required.  PA submitted on CoverMyMeds Key [id number] Status is pending  Oral Oncology Clinic will continue to follow.   [place] Patient [place] Phone [phone] Fax [phone] 02/09/2021 8:28 AM

## 2021-02-09 NOTE — Telephone Encounter (Signed)
Oral Oncology Patient Advocate Encounter  Prior Authorization for Revlimid has been approved.    PA# [id number] Effective dates: 01/10/21 through 12/22/2098  Oral Oncology Clinic will continue to follow.    [place] Patient [place] Phone [phone] Fax [phone] 02/09/2021 8:30 AM

## 2021-02-27 ENCOUNTER — Other Ambulatory Visit: Payer: Self-pay | Admitting: Hematology

## 2021-02-27 DIAGNOSIS — C9001 Multiple myeloma in remission: Secondary | ICD-10-CM

## 2021-04-04 ENCOUNTER — Telehealth: Payer: Self-pay | Admitting: Hematology

## 2021-04-04 NOTE — Telephone Encounter (Signed)
Rescheduled upcoming appointments due to provider's PAL. Patient is aware of changes. 

## 2021-04-06 ENCOUNTER — Other Ambulatory Visit: Payer: Medicare Other

## 2021-04-09 ENCOUNTER — Other Ambulatory Visit: Payer: Self-pay | Admitting: Internal Medicine

## 2021-04-09 NOTE — Telephone Encounter (Signed)
SCr abnormally elevated to 1.75 on last check from Feb 2022, historically ranged 1-1.3. Pt has repeat labs scheduled 04/26/21 for oncology appt. I have sent in 1 month refill and will reassess SCr trend after next set of labs are drawn.

## 2021-04-09 NOTE — Telephone Encounter (Signed)
Pt requesting refill of xarelto 20mg  whe they only qualify for 15mg . Will route to pharmd pool. 39m, 85.7kg, scr 1.75 02/02/21, lovw/hilty 01/03/21, ccr 40.1

## 2021-04-13 ENCOUNTER — Ambulatory Visit: Payer: Medicare Other | Admitting: Hematology

## 2021-04-16 ENCOUNTER — Other Ambulatory Visit: Payer: Self-pay | Admitting: Hematology

## 2021-04-18 NOTE — Telephone Encounter (Signed)
Please review for refill thank you. 

## 2021-04-22 ENCOUNTER — Other Ambulatory Visit: Payer: Self-pay | Admitting: Hematology

## 2021-04-22 DIAGNOSIS — C9001 Multiple myeloma in remission: Secondary | ICD-10-CM

## 2021-04-23 ENCOUNTER — Other Ambulatory Visit: Payer: Self-pay

## 2021-04-26 ENCOUNTER — Inpatient Hospital Stay: Payer: Medicare Other | Attending: Hematology

## 2021-04-26 ENCOUNTER — Other Ambulatory Visit: Payer: Self-pay

## 2021-04-26 DIAGNOSIS — N281 Cyst of kidney, acquired: Secondary | ICD-10-CM | POA: Insufficient documentation

## 2021-04-26 DIAGNOSIS — G620 Drug-induced polyneuropathy: Secondary | ICD-10-CM | POA: Insufficient documentation

## 2021-04-26 DIAGNOSIS — I480 Paroxysmal atrial fibrillation: Secondary | ICD-10-CM | POA: Insufficient documentation

## 2021-04-26 DIAGNOSIS — M810 Age-related osteoporosis without current pathological fracture: Secondary | ICD-10-CM | POA: Insufficient documentation

## 2021-04-26 DIAGNOSIS — Z8673 Personal history of transient ischemic attack (TIA), and cerebral infarction without residual deficits: Secondary | ICD-10-CM | POA: Diagnosis not present

## 2021-04-26 DIAGNOSIS — Z7901 Long term (current) use of anticoagulants: Secondary | ICD-10-CM | POA: Diagnosis not present

## 2021-04-26 DIAGNOSIS — C9000 Multiple myeloma not having achieved remission: Secondary | ICD-10-CM | POA: Insufficient documentation

## 2021-04-26 DIAGNOSIS — C9001 Multiple myeloma in remission: Secondary | ICD-10-CM

## 2021-04-26 DIAGNOSIS — Z85820 Personal history of malignant melanoma of skin: Secondary | ICD-10-CM | POA: Diagnosis not present

## 2021-04-26 DIAGNOSIS — T451X5A Adverse effect of antineoplastic and immunosuppressive drugs, initial encounter: Secondary | ICD-10-CM | POA: Diagnosis not present

## 2021-04-26 LAB — CBC WITH DIFFERENTIAL/PLATELET
Abs Immature Granulocytes: 0.01 10*3/uL (ref 0.00–0.07)
Basophils Absolute: 0 10*3/uL (ref 0.0–0.1)
Basophils Relative: 1 %
Eosinophils Absolute: 0.2 10*3/uL (ref 0.0–0.5)
Eosinophils Relative: 6 %
HCT: 38.5 % — ABNORMAL LOW (ref 39.0–52.0)
Hemoglobin: 12.6 g/dL — ABNORMAL LOW (ref 13.0–17.0)
Immature Granulocytes: 0 %
Lymphocytes Relative: 28 %
Lymphs Abs: 0.8 10*3/uL (ref 0.7–4.0)
MCH: 29.9 pg (ref 26.0–34.0)
MCHC: 32.7 g/dL (ref 30.0–36.0)
MCV: 91.2 fL (ref 80.0–100.0)
Monocytes Absolute: 0.4 10*3/uL (ref 0.1–1.0)
Monocytes Relative: 13 %
Neutro Abs: 1.5 10*3/uL — ABNORMAL LOW (ref 1.7–7.7)
Neutrophils Relative %: 52 %
Platelets: 119 10*3/uL — ABNORMAL LOW (ref 150–400)
RBC: 4.22 MIL/uL (ref 4.22–5.81)
RDW: 13.9 % (ref 11.5–15.5)
WBC: 2.9 10*3/uL — ABNORMAL LOW (ref 4.0–10.5)
nRBC: 0 % (ref 0.0–0.2)

## 2021-04-26 LAB — CMP (CANCER CENTER ONLY)
ALT: 6 U/L (ref 0–44)
AST: 17 U/L (ref 15–41)
Albumin: 3.6 g/dL (ref 3.5–5.0)
Alkaline Phosphatase: 63 U/L (ref 38–126)
Anion gap: 6 (ref 5–15)
BUN: 15 mg/dL (ref 8–23)
CO2: 25 mmol/L (ref 22–32)
Calcium: 8.5 mg/dL — ABNORMAL LOW (ref 8.9–10.3)
Chloride: 111 mmol/L (ref 98–111)
Creatinine: 1.13 mg/dL (ref 0.61–1.24)
GFR, Estimated: >60 mL/min (ref >60)
Glucose, Bld: 126 mg/dL — ABNORMAL HIGH (ref 70–99)
Potassium: 4.4 mmol/L (ref 3.5–5.1)
Sodium: 142 mmol/L (ref 135–145)
Total Bilirubin: 0.7 mg/dL (ref 0.3–1.2)
Total Protein: 6.5 g/dL (ref 6.5–8.1)

## 2021-04-27 LAB — KAPPA/LAMBDA LIGHT CHAINS
Kappa free light chain: 30.6 mg/L — ABNORMAL HIGH (ref 3.3–19.4)
Kappa, lambda light chain ratio: 1.16 (ref 0.26–1.65)
Lambda free light chains: 26.4 mg/L — ABNORMAL HIGH (ref 5.7–26.3)

## 2021-04-30 ENCOUNTER — Other Ambulatory Visit: Payer: Self-pay | Admitting: Family Medicine

## 2021-04-30 DIAGNOSIS — K862 Cyst of pancreas: Secondary | ICD-10-CM

## 2021-04-30 DIAGNOSIS — E559 Vitamin D deficiency, unspecified: Secondary | ICD-10-CM | POA: Diagnosis not present

## 2021-04-30 DIAGNOSIS — I10 Essential (primary) hypertension: Secondary | ICD-10-CM | POA: Diagnosis not present

## 2021-04-30 DIAGNOSIS — C9000 Multiple myeloma not having achieved remission: Secondary | ICD-10-CM | POA: Diagnosis not present

## 2021-04-30 DIAGNOSIS — E7800 Pure hypercholesterolemia, unspecified: Secondary | ICD-10-CM | POA: Diagnosis not present

## 2021-04-30 DIAGNOSIS — E1169 Type 2 diabetes mellitus with other specified complication: Secondary | ICD-10-CM | POA: Diagnosis not present

## 2021-04-30 DIAGNOSIS — Q6100 Congenital renal cyst, unspecified: Secondary | ICD-10-CM | POA: Diagnosis not present

## 2021-04-30 DIAGNOSIS — I25119 Atherosclerotic heart disease of native coronary artery with unspecified angina pectoris: Secondary | ICD-10-CM | POA: Diagnosis not present

## 2021-04-30 DIAGNOSIS — I4891 Unspecified atrial fibrillation: Secondary | ICD-10-CM | POA: Diagnosis not present

## 2021-04-30 DIAGNOSIS — N144 Toxic nephropathy, not elsewhere classified: Secondary | ICD-10-CM | POA: Diagnosis not present

## 2021-04-30 DIAGNOSIS — D6869 Other thrombophilia: Secondary | ICD-10-CM | POA: Diagnosis not present

## 2021-04-30 DIAGNOSIS — N179 Acute kidney failure, unspecified: Secondary | ICD-10-CM | POA: Diagnosis not present

## 2021-04-30 LAB — MULTIPLE MYELOMA PANEL, SERUM
Albumin SerPl Elph-Mcnc: 3.6 g/dL (ref 2.9–4.4)
Albumin/Glob SerPl: 1.5 (ref 0.7–1.7)
Alpha 1: 0.2 g/dL (ref 0.0–0.4)
Alpha2 Glob SerPl Elph-Mcnc: 0.7 g/dL (ref 0.4–1.0)
B-Globulin SerPl Elph-Mcnc: 0.8 g/dL (ref 0.7–1.3)
Gamma Glob SerPl Elph-Mcnc: 0.8 g/dL (ref 0.4–1.8)
Globulin, Total: 2.5 g/dL (ref 2.2–3.9)
IgA: 136 mg/dL (ref 61–437)
IgG (Immunoglobin G), Serum: 896 mg/dL (ref 603–1613)
IgM (Immunoglobulin M), Srm: 31 mg/dL (ref 15–143)
M Protein SerPl Elph-Mcnc: 0.1 g/dL — ABNORMAL HIGH
Total Protein ELP: 6.1 g/dL (ref 6.0–8.5)

## 2021-05-01 ENCOUNTER — Other Ambulatory Visit: Payer: Self-pay | Admitting: Family Medicine

## 2021-05-01 DIAGNOSIS — E559 Vitamin D deficiency, unspecified: Secondary | ICD-10-CM | POA: Diagnosis not present

## 2021-05-01 DIAGNOSIS — E7800 Pure hypercholesterolemia, unspecified: Secondary | ICD-10-CM | POA: Diagnosis not present

## 2021-05-01 DIAGNOSIS — I10 Essential (primary) hypertension: Secondary | ICD-10-CM | POA: Diagnosis not present

## 2021-05-01 DIAGNOSIS — N50811 Right testicular pain: Secondary | ICD-10-CM

## 2021-05-01 DIAGNOSIS — E1169 Type 2 diabetes mellitus with other specified complication: Secondary | ICD-10-CM | POA: Diagnosis not present

## 2021-05-02 NOTE — Progress Notes (Signed)
HEMATOLOGY/ONCOLOGY CLINIC NOTE  Date of Service: 05/03/2021   Patient Care Team: Mayra Neer, MD as PCP - General (Family Medicine) Debara Pickett Nadean Corwin, MD as PCP - Cardiology (Cardiology)   CHIEF COMPLAINTS/:  F/u for continued mx of Myeloma   HISTORY OF PRESENTING ILLNESS:  Justin [name] is a wonderful 80 y.o. 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  Patient has a history of hypertension, diabetes, GERD, gout, squamous cell skin cancers, elevated PSA, SVT status post ablation in May 2016, chronic back pain who has a history of recurrent CVA/TIA's. He apparently had a left hemispheric TIA June 2017 and has had recurrent) subcortical infarcts thought to be related to small vessel disease. He was noted to have a small PFO that was of questionable significance. He was initially on aspirin and then continued and aspirin + plavix for secondary stroke prevention. He follows with Dr. Antony Contras for his neurology cares.  An SPEP was done due to elevated total proteins of 8.9 and showed an M spike of 2 g/dL. He was also noted to have an elevated total protein of 8.6 in April 2017. He was referred to [place] for further evaluation of his M spike. Blood test did not reveal any hypercalcemia, no significant renal failure. He has been noted to have developed anemia since June the serum and his hemoglobin was 12.6 and is now down to the mid 10 range. He notes no focal bone pains at this time. Overt acute weight loss. No fevers no chills no night sweats.   CURRENT THERAPY:   Revlimid maintenance 5 mg po 3 weeks on 1 week off.   INTERVAL HISTORY:  Justin [name] was seen today for follow-up of his myeloma. The patient's last visit with [place] was on 02/02/2021. The pt reports that he is doing well overall.  The pt reports no new symptoms. The pt notes the boil on his back has been improving, but is still there to his knowledge. He saw  another doctor for this who helped drain it further. The pt notes he stopped the Revlimid for three weeks during this time, but has since been back on it. He notes no issues tolerating this. The pt saw his PCP recently this week and they made some changes. The pt notes she has decreased his dosage of potassium and magnesium. He is also interested in taking Total Restore for leaky gut. The pt notes the recent loss of his younger sister.  Lab results today 04/26/2021 of CBC w/diff and CMP is as follows: all values are WNL except for WBC of 2.9K, Hgb of 12.6, HCT of 38.5, Plt of 119K, Neutro Abs of 1.5K, Glucose of 126, Calcium of 8.5. 04/26/2021 Kappa free of 30.6, Lambda free of 26.4. 04/26/2021 MMP WNL except m-protein of 0.1.  On review of systems, pt denies SOB, fevers, chills, abdominal pain, back pain, leg swelling, and any other symptoms.  MEDICAL HISTORY:  Past Medical History:  Diagnosis Date  . Anemia   . Arthritis   . 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 Diabetes mellitus without complication ([place])   . GERD (gastroesophageal reflux disease)   . Gout   . History of loop recorder   . Hypertension   . Left leg weakness   . Multiple myeloma ([place]) 09/27/2018  . Paroxysmal SVT (supraventricular tachycardia) ([place])    a.  s/p RFCA on 05/01/15  . Stroke [facility]) 2017   TIA and mild stroke; denies stroke symptoms since then     SURGICAL HISTORY: Past Surgical History:  Procedure Laterality Date  . ATRIAL FIBRILLATION ABLATION     Dr. Lovena Le  . BASAL CELL CARCINOMA EXCISION     off of back  . ELECTROPHYSIOLOGIC STUDY N/A 05/01/2015   Procedure: SVT Ablation;  Surgeon: Evans Lance, MD;  Location: [facility];  Service: Cardiovascular;  Laterality: N/A;  . EP IMPLANTABLE DEVICE N/A 06/04/2016   Procedure: Loop Recorder Insertion;  Surgeon: Thompson Grayer, MD;  Location: [facility];  Service: Cardiovascular;  Laterality: N/A;  . INGUINAL  HERNIA REPAIR Bilateral 06/16/2017   Procedure: LAPAROSCOPIC BILATERAL INGUINAL HERNIA REPAIR;  Surgeon: Clovis Riley, MD;  Location: [facility];  Service: General;  Laterality: Bilateral;  . INSERTION OF MESH Bilateral 06/16/2017   Procedure: INSERTION OF MESH;  Surgeon: Clovis Riley, MD;  Location: [facility];  Service: General;  Laterality: Bilateral;  . MASS EXCISION Right 07/28/2018   Procedure: EXCISION OF RIGHT FOREARM MASS;  Surgeon: Clovis Riley, MD;  Location: [facility];  Service: General;  Laterality: Right;  . SQUAMOUS CELL CARCINOMA EXCISION    . TEE WITHOUT CARDIOVERSION N/A 06/04/2016   Procedure: TRANSESOPHAGEAL ECHOCARDIOGRAM (TEE);  Surgeon: Pixie Casino, MD;  Location: [facility];  Service: Cardiovascular;  Laterality: N/A;    SOCIAL HISTORY: Social History   Socioeconomic History  . Marital status: Married    Spouse name: Not on file  . Number of children: Not on file  . Years of education: Not on file  . Highest education level: Not on file  Occupational History  . Not on file  Tobacco Use  . Smoking status: Never Smoker  . Smokeless tobacco: Never Used  Vaping Use  . Vaping Use: Never used  Substance and Sexual Activity  . Alcohol use: No  . Drug use: No  . Sexual activity: Not on file  Other Topics Concern  . Not on file  Social History Narrative  . Not on file   Social Determinants of Health   Financial Resource Strain: Not on file  Food Insecurity: Not on file  Transportation Needs: Not on file  Physical Activity: Not on file  Stress: Not on file  Social Connections: Not on file  Intimate Partner Violence: Not on file   Justin [name] is retired. His daughter is Mudlogger of nursing for a nursing home in [place], [place].    FAMILY HISTORY: Family History  Problem Relation Age of Onset  . Stroke Mother   . Hypertension Mother   . Alzheimer's disease Father   . Heart failure Father   . Down syndrome Daughter     ALLERGIES:  is allergic to  other and no known allergies.  MEDICATIONS:  Current Outpatient Medications  Medication Sig Dispense Refill  . lenalidomide (REVLIMID) 5 MG capsule TAKE 1 CAPSULE DAILY FOR 21 DAYS ON AND 7 DAYS OFF, REPEAT EVERY 28 DAYS 21 capsule 0  . allopurinol (ZYLOPRIM) 100 MG tablet Take by mouth.    . Ascorbic Acid (VITAMIN C) 1000 MG tablet 1 tablet    . atorvastatin (LIPITOR) 10 MG tablet Take 10 mg by mouth daily.    . Coenzyme Q10 (CO Q-10) 100 MG CAPS See admin instructions.    . cyanocobalamin 1000 MCG tablet Take 1,000 mcg by mouth daily.    . Ferrous Sulfate (IRON) 90 (18  Fe) MG TABS Take by mouth. (Patient not taking: Reported on 01/03/2021)    . FREESTYLE LITE test strip     . glucose monitoring kit (FREESTYLE) monitoring kit 1 each by Does not apply route as needed for other.    . Lancets (FREESTYLE) lancets 1 each daily.    . Magnesium 400 MG TABS 1 tablet with a meal    . metFORMIN (GLUCOPHAGE) 1000 MG tablet Take 1 tablet (1,000 mg total) by mouth daily with breakfast. (Patient taking differently: Take 500 mg by mouth daily with breakfast.) 90 tablet 3  . mupirocin cream (BACTROBAN) 2 % Apply 1 application topically 2 (two) times daily. To sore/boil over the back 15 g 1  . nitroGLYCERIN (NITROSTAT) 0.4 MG SL tablet Place 1 tablet (0.4 mg total) under the tongue every 5 (five) minutes as needed for chest pain (Max 3 doses in 15 mins.). 30 tablet 0  . omeprazole (PRILOSEC) 20 MG capsule Take 20 mg by mouth every morning.     . Potassium 99 MG TABS 1 tablet    . pregabalin (LYRICA) 75 MG capsule Take 75 mg by mouth 3 (three) times daily.    . Vitamin D, Ergocalciferol, (DRISDOL) 1.25 MG (50000 UNIT) CAPS capsule TAKE 1 CAPSULE BY MOUTH 1 TIME A WEEK 12 capsule 3  . XARELTO 20 MG TABS tablet TAKE 1 TABLET DAILY WITH SUPPER 30 tablet 0   No current facility-administered medications for this visit.    REVIEW OF SYSTEMS:   10 Point review of Systems was done is negative except as noted  above.   PHYSICAL EXAMINATION:   ECOG PERFORMANCE STATUS: 2 - Symptomatic, <50% confined to bed Vitals:   05/03/21 0923  BP: (!) 127/57  Pulse: (!) 56  Resp: 18  Temp: 97.8 F (36.6 C)  SpO2: 99%    Wt Readings from Last 3 Encounters:  05/03/21 196 lb 6.4 oz (89.1 kg)  02/09/21 189 lb (85.7 kg)  01/03/21 186 lb (84.4 kg)   Body mass index is 29.86 kg/m.    GENERAL:alert, in no acute distress and comfortable SKIN: no acute rashes, no significant lesions EYES: conjunctiva are pink and non-injected, sclera anicteric OROPHARYNX: MMM, no exudates, no oropharyngeal erythema or ulceration NECK: supple, no JVD LYMPH:  no palpable lymphadenopathy in the cervical, axillary or inguinal regions LUNGS: clear to auscultation b/l with normal respiratory effort HEART: regular rate & rhythm ABDOMEN:  normoactive bowel sounds , non tender, not distended. Extremity: no pedal edema PSYCH: alert & oriented x 3 with fluent speech NEURO: no focal motor/sensory deficits   LABORATORY DATA:  I have reviewed the data as listed. CBC Latest Ref Rng & Units 04/26/2021 02/02/2021 11/27/2020  WBC 4.0 - 10.5 K/uL 2.9(L) 2.5(L) 3.7(L)  Hemoglobin 13.0 - 17.0 g/dL 12.6(L) 11.7(L) 12.6(L)  Hematocrit 39.0 - 52.0 % 38.5(L) 37.3(L) 37.5(L)  Platelets 150 - 400 K/uL 119(L) 110(L) 126(L)  ANC 1k  CMP Latest Ref Rng & Units 04/26/2021 02/02/2021 11/27/2020  Glucose 70 - 99 mg/dL 126(H) 117(H) 124(H)  BUN 8 - 23 mg/dL 15 32(H) 21  Creatinine 0.61 - 1.24 mg/dL 1.13 1.75(H) 1.22  Sodium 135 - 145 mmol/L 142 136 140  Potassium 3.5 - 5.1 mmol/L 4.4 5.5(H) 4.8  Chloride 98 - 111 mmol/L 111 109 104  CO2 22 - 32 mmol/L _0 Calcium 8.9 - 10.3 mg/dL 8.5(L) 8.6(L) 9.3  Total Protein 6.5 - 8.1 g/dL 6.5 6.7 6.7  Total Bilirubin 0.3 -  1.2 mg/dL 0.7 0.6 0.6  Alkaline Phos 38 - 126 U/L 63 69 64  AST 15 - 41 U/L _0 ALT 0 - 44 U/L _1 Lab Results  Component Value Date   IRON 45 06/02/2017   TIBC  233 06/02/2017   IRONPCTSAT 19 (L) 06/02/2017   (Iron and TIBC)  Lab Results  Component Value Date   FERRITIN 308 08/26/2017         RADIOGRAPHIC STUDIES: I have personally reviewed the radiological images as listed and agreed with the findings in the report.       Bone Marrow Biopsy 12/24/2016 (Accession [id number])  Diagnosis Bone Marrow, Aspirate,Biopsy, and Clot, left iliac crest - HYPERCELLULAR BONE MARROW FOR AGE WITH PLASMA CELL NEOPLASM. - SEE COMMENT. PERIPHERAL BLOOD: - NORMOCYTIC-NORMOCHROMIC ANEMIA. - LEUKOPENIA.  DG Bone Survey Met (Accession [id number]) (Order [id number])  Imaging  Date: 11/28/2016 Department: [facility]-RADIOLOGY[facility]STIC Released By: Pricilla Riffle Authorizing: Brunetta Genera, MD  Exam Information   Status Exam Begun  Exam Ended   Final [99] 11/28/2016 11:59 AM 11/28/2016 12:36 PM  PACS Images   Show images for DG Bone Survey Met  Study Result   CLINICAL DATA:  Monoclonal gammopathy of unknown significance. History of squamous cell carcinoma excision, basal cell carcinoma excision.  EXAM: METASTATIC BONE SURVEY  COMPARISON:  CT neck, chest, abdomen and pelvis from 10/29/16, MRI of the head from 06/01/2016, CXR 10/27/2016, lumbar spine radiographs 06/24/2016  FINDINGS: Lateral skull: Small occipital lucency which may represent a normal arachnoid granulation posteriorly in the occiput. No definite lytic abnormality.  Cervical spine AP and lateral: Disc space narrowing at C2-3, and from C4 through C7. C4-5, C5-6 and C6-7 uncovertebral joint spurring bilaterally. No lytic abnormality.  Thoracic spine AP and lateral: T8-9 right-sided osteophytes and to a lesser degree T9-10. No lytic abnormality. Slight multilevel lumbar thoracic disc space narrowing likely degenerative.  Lumbar spine AP and lateral: Disc space narrowing at L4-5 and to a greater extent L5-S1. No lytic disease. L3 through S1  facet sclerosis.  AP pelvis: Negative for lytic disease.  Bilateral upper and lower extremities shoulders through wrist and from the hips through ankle: Negative for lytic disease. Joint space narrowing of the femorotibial compartments both knees. Subchondral cyst of the right patella.  CXR: Clear lungs. Cardiac implantable monitoring device projects over the left heart. Aortic atherosclerosis. No lytic disease.  IMPRESSION: No findings suspicious for lytic disease.   Electronically Signed   By: Ashley Royalty M.D.   On: 11/28/2016 14:58     ASSESSMENT & PLAN:   80 y.o. 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 (12/2016) Peak M spike 2.6  Cytogenetics and Myeloma FISH panel.- trisomy 11 Patient has normocytic anemia without any other clear etiology. (>2g/dl lower that lower limit of normal which is 13) which per criteria would place this in the Multiple myeloma category as opposed to Smoldering Multiple myeloma (Borderline criterion)  No overt renal failure or hypercalcemia at this time No focal bone pains though he has significant chronic back pain related to degenerative disc disease. Bone survey shows no overt lytic lesions.  10/16/18 CT Coronary Morph revealed Coronary artery calcium score 299 Agatston units, this places the patient in the 47th percentile for age and gender, suggesting intermediate risk for future cardiac events. 2. Nonobstructive coronary disease.  12/08/18 Bone  Density study revealed that the pt has osteoporosis   07/05/2019 shows an M protein of 0.1.  2) Anemia and thrombocytopenia -- related to treatment (Revlimid) -stable  3) Grade 2 Neuropathy -- in b/l feet and halfway up lower leg without pain -Seconadry to [employer] -improved off [place]  5) Right kidney cyst with nodularity -Reviewed the 01/05/19 US Abdomen which revealed Septated cyst arising from lower pole  right kidney with a questionable solid focus within this lesion. This nodular area potentially could represent a small neoplasm developing within this complex cyst. Given this circumstance, pre and post contrast MR of the kidneys is advised to further evaluate. If there is a contraindication to MR, pre and post-contrast CT would be a reasonable alternative to further assess. Study otherwise unremarkable. -Pt will likely need urology referral and MRI, pt would like to discuss this further with his PCP before triggering a referral at this point  6) Borderline low B12 levels, previously 299 --- on replacement -  -05/05/19 Vitamin B12 at 586 -continue replacement.   7) recurrent CVA - thought to be related to small vessel disease as per neurology. Has a small PFO which could be an additional risk factor as well as his afib  8)  P afib Plan -Continue on Xarelto per cardiology.  9) H/o recurrent SCC -Underwent surgical excision on right forearm with Dr. Romana Juniper on 07/28/18. His surgical pathology showed evidence of Squamous Cell Carcinoma. Plan -no evidence of recurrence locally at the site of surgery at this time. -continue dermatology followup  10) Lower extremity discomfort - improved, stable. Likely Discogenic pain -[place] on 12/2017 revealed no blood clot -Pt has established care with orthopedist and PT.  11) Patient Active Problem List   Diagnosis Date Noted  . Osteoporosis 03/10/2019  . Multiple myeloma without remission ([place]) 03/10/2019  . Chest pain 09/27/2018  . Multiple myeloma ([place]) 09/27/2018  . Degeneration of lumbar intervertebral disc 01/30/2018  . Low back pain 01/30/2018  . Lumbar radiculopathy 01/30/2018  . History of loop recorder 06/12/2017  . Cryptogenic stroke ([place]) 10/28/2016  . Gait abnormality   . History of CVA (cerebrovascular accident)   . History of TIA (transient ischemic attack)   . Benign essential HTN   . Paroxysmal SVT (supraventricular tachycardia)  ([place])   . Acute blood loss anemia   . Acute ischemic stroke ([place])   . CVA (cerebral vascular accident) ([place]) 10/26/2016  . 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 History of recent stroke 06/06/2016  . 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 TIA (transient ischemic attack) 06/02/2016  . Numbness 06/01/2016  . Right arm numbness 06/01/2016  . Atherosclerosis of native coronary artery of native heart without angina pectoris 03/20/2016  . Hypertension   . GERD (gastroesophageal reflux disease)   . Gout   . S/P RF ablation operation for arrhythmia 12/13/2014  . Hyperlipidemia 12/13/2014  . Controlled type 2 diabetes mellitus with complication, without long-term current use of insulin ([place]) 12/07/2014    PLAN: -Discussed pt labwork, 04/26/2021; no anemia, mild leukopenia, chronic thrombocytopenia, chemistries improved, m-protein increased to 0.1, light chains stable.  -Recommended pt receive the second COVID booster shot as recently approved. Advised pt to wait 4-6 months following first booster shot before getting this. -Discussed Evusheld and pt's eligibility. Will send referral. Advised pt to wait around one month after this before getting the second booster shot. -Advised pt that his m-protein is now just detectable. Will continue to monitor. -No lab or clinical  evidence of Multiple Myeloma progression at this time. Will continue to monitor. -The pt has no prohibitive toxicities from continuing 5 mg Revlimid 3 weeks on, 1 week off at this time. -Recommended pt increase water intake, hold potassium, and re-check labs w PCP in 2 weeks. Drink at least 48 oz water daily. -Continue Prolia q60month -Will see back in 10 weeks with labs 1 week prior.   FOLLOW UP: Labs in 9 weeks RTC with Dr KIrene Limboin 10 weeks Referral for Evusheld   The total time spent in the appointment was 20 minutes and more than 50% was on counseling and direct patient cares.  All of the patient's questions were answered  with apparent satisfaction. The patient knows to call the clinic with any problems, questions or concerns.   GSullivan LoneMD MPinevilleAAHIVMS SSunrise Flamingo Surgery Center Limited PartnershipCRogers City Rehabilitation HospitalHematology/Oncology Physician CInland [facility] (Office):       [phone](Work cell):  [phone](Fax):           [phone] I, RReinaldo Raddle am acting as scribe for Dr. GSullivan Lone MD.   .I have reviewed the above documentation for accuracy and completeness, and I agree with the above. .Brunetta GeneraMD

## 2021-05-03 ENCOUNTER — Inpatient Hospital Stay (HOSPITAL_BASED_OUTPATIENT_CLINIC_OR_DEPARTMENT_OTHER): Payer: Medicare Other | Admitting: Hematology

## 2021-05-03 ENCOUNTER — Other Ambulatory Visit: Payer: Self-pay

## 2021-05-03 VITALS — BP 127/57 | HR 56 | Temp 97.8°F | Resp 18 | Ht 68.0 in | Wt 196.4 lb

## 2021-05-03 DIAGNOSIS — I480 Paroxysmal atrial fibrillation: Secondary | ICD-10-CM | POA: Diagnosis not present

## 2021-05-03 DIAGNOSIS — C9000 Multiple myeloma not having achieved remission: Secondary | ICD-10-CM | POA: Diagnosis not present

## 2021-05-03 DIAGNOSIS — M818 Other osteoporosis without current pathological fracture: Secondary | ICD-10-CM

## 2021-05-03 DIAGNOSIS — Z8673 Personal history of transient ischemic attack (TIA), and cerebral infarction without residual deficits: Secondary | ICD-10-CM | POA: Diagnosis not present

## 2021-05-03 DIAGNOSIS — Z85820 Personal history of malignant melanoma of skin: Secondary | ICD-10-CM | POA: Diagnosis not present

## 2021-05-03 DIAGNOSIS — G620 Drug-induced polyneuropathy: Secondary | ICD-10-CM | POA: Diagnosis not present

## 2021-05-03 DIAGNOSIS — M810 Age-related osteoporosis without current pathological fracture: Secondary | ICD-10-CM | POA: Diagnosis not present

## 2021-05-10 ENCOUNTER — Ambulatory Visit
Admission: RE | Admit: 2021-05-10 | Discharge: 2021-05-10 | Disposition: A | Discharge status: Home / Self Care | Payer: Medicare Other | Source: Ambulatory Visit | Attending: Family Medicine | Admitting: Family Medicine

## 2021-05-10 DIAGNOSIS — N50811 Right testicular pain: Secondary | ICD-10-CM

## 2021-05-10 DIAGNOSIS — N503 Cyst of epididymis: Secondary | ICD-10-CM | POA: Diagnosis not present

## 2021-05-10 DIAGNOSIS — N5089 Other specified disorders of the male genital organs: Secondary | ICD-10-CM | POA: Diagnosis not present

## 2021-05-10 DIAGNOSIS — I861 Scrotal varices: Secondary | ICD-10-CM | POA: Diagnosis not present

## 2021-05-12 ENCOUNTER — Ambulatory Visit
Admission: RE | Admit: 2021-05-12 | Discharge: 2021-05-12 | Disposition: A | Discharge status: Home / Self Care | Payer: Medicare Other | Source: Ambulatory Visit | Attending: Family Medicine | Admitting: Family Medicine

## 2021-05-12 DIAGNOSIS — K8689 Other specified diseases of pancreas: Secondary | ICD-10-CM | POA: Diagnosis not present

## 2021-05-12 DIAGNOSIS — N281 Cyst of kidney, acquired: Secondary | ICD-10-CM | POA: Diagnosis not present

## 2021-05-12 DIAGNOSIS — K862 Cyst of pancreas: Secondary | ICD-10-CM | POA: Diagnosis not present

## 2021-05-12 DIAGNOSIS — K3189 Other diseases of stomach and duodenum: Secondary | ICD-10-CM | POA: Diagnosis not present

## 2021-05-12 MED ORDER — GADOBENATE DIMEGLUMINE 529 MG/ML IV SOLN
18.0000 mL | Freq: Once | INTRAVENOUS | Status: AC | PRN
  Administered 2021-05-12: 18 mL via INTRAVENOUS

## 2021-05-13 ENCOUNTER — Telehealth: Payer: Self-pay | Admitting: Adult Health

## 2021-05-13 ENCOUNTER — Encounter: Payer: Self-pay | Admitting: Hematology

## 2021-05-13 NOTE — Telephone Encounter (Signed)
I called patient to discuss Evusheld, a long acting monoclonal antibody injection administered to patients with decreased immune systems or intolerance/allergy to the COVID 19 vaccine as COVID19 prevention.    Unable to reach patient.  LMOM to return my call.  Vickey Boak, NP  

## 2021-05-16 ENCOUNTER — Other Ambulatory Visit: Payer: Medicare Other

## 2021-05-16 ENCOUNTER — Telehealth: Payer: Self-pay | Admitting: Adult Health

## 2021-05-16 NOTE — Telephone Encounter (Signed)
I called patient to discuss Evusheld, a long acting monoclonal antibody injection administered to patients with decreased immune systems or intolerance/allergy to the COVID 19 vaccine as COVID19 prevention.    We discussed risks and benefits in detail.  He is concerned over the cardiac risks with the injection and would like to reach out to his cardiologist.  He will call me back if he would like to proceed.    Justin Bihari, NP

## 2021-05-17 ENCOUNTER — Ambulatory Visit: Payer: Medicare Other

## 2021-05-17 ENCOUNTER — Telehealth: Payer: Self-pay

## 2021-05-17 ENCOUNTER — Other Ambulatory Visit: Payer: Self-pay

## 2021-05-17 DIAGNOSIS — C9001 Multiple myeloma in remission: Secondary | ICD-10-CM

## 2021-05-17 MED ORDER — LENALIDOMIDE 5 MG PO CAPS: ORAL_CAPSULE | ORAL | 0 refills | Status: DC

## 2021-05-17 NOTE — Telephone Encounter (Signed)
This nurse spoke with patient and informed him that his Covid booster will be given at his next scheduled appt on 6/17 at 11:00.  Patient also stated that he would like to decline the recommendation for Evusheld.  Provider made aware.  No further questions or concerns at this time.

## 2021-05-30 ENCOUNTER — Other Ambulatory Visit: Payer: Self-pay

## 2021-05-30 MED ORDER — METFORMIN HCL 1000 MG PO TABS: 500.0000 mg | ORAL_TABLET | Freq: Every day | ORAL | 3 refills | Status: DC

## 2021-05-30 MED ORDER — RIVAROXABAN 20 MG PO TABS: ORAL_TABLET | ORAL | 1 refills | Status: DC

## 2021-05-30 NOTE — Telephone Encounter (Signed)
30m, 89.1kg, scr 1.13 04/26/21, lovw/hilty 01/03/21, ccr 54.9

## 2021-06-08 ENCOUNTER — Inpatient Hospital Stay: Payer: Medicare Other | Attending: Hematology

## 2021-06-08 ENCOUNTER — Inpatient Hospital Stay: Payer: Medicare Other

## 2021-06-08 ENCOUNTER — Inpatient Hospital Stay (HOSPITAL_BASED_OUTPATIENT_CLINIC_OR_DEPARTMENT_OTHER): Payer: Medicare Other

## 2021-06-08 ENCOUNTER — Other Ambulatory Visit: Payer: Self-pay

## 2021-06-08 VITALS — BP 120/67 | HR 53 | Temp 98.1°F | Resp 18

## 2021-06-08 DIAGNOSIS — C9000 Multiple myeloma not having achieved remission: Secondary | ICD-10-CM

## 2021-06-08 DIAGNOSIS — Z23 Encounter for immunization: Secondary | ICD-10-CM | POA: Diagnosis present

## 2021-06-08 DIAGNOSIS — M818 Other osteoporosis without current pathological fracture: Secondary | ICD-10-CM

## 2021-06-08 LAB — CMP (CANCER CENTER ONLY)
ALT: 15 U/L (ref 0–44)
AST: 22 U/L (ref 15–41)
Albumin: 3.8 g/dL (ref 3.5–5.0)
Alkaline Phosphatase: 50 U/L (ref 38–126)
Anion gap: 6 (ref 5–15)
BUN: 22 mg/dL (ref 8–23)
CO2: 28 mmol/L (ref 22–32)
Calcium: 9 mg/dL (ref 8.9–10.3)
Chloride: 105 mmol/L (ref 98–111)
Creatinine: 1.29 mg/dL — ABNORMAL HIGH (ref 0.61–1.24)
GFR, Estimated: 56 mL/min — ABNORMAL LOW (ref >60)
Glucose, Bld: 130 mg/dL — ABNORMAL HIGH (ref 70–99)
Potassium: 4.8 mmol/L (ref 3.5–5.1)
Sodium: 139 mmol/L (ref 135–145)
Total Bilirubin: 0.9 mg/dL (ref 0.3–1.2)
Total Protein: 6.7 g/dL (ref 6.5–8.1)

## 2021-06-08 LAB — CBC WITH DIFFERENTIAL/PLATELET
Abs Immature Granulocytes: 0 10*3/uL (ref 0.00–0.07)
Basophils Absolute: 0.1 10*3/uL (ref 0.0–0.1)
Basophils Relative: 3 %
Eosinophils Absolute: 0.1 10*3/uL (ref 0.0–0.5)
Eosinophils Relative: 4 %
HCT: 38 % — ABNORMAL LOW (ref 39.0–52.0)
Hemoglobin: 12.5 g/dL — ABNORMAL LOW (ref 13.0–17.0)
Immature Granulocytes: 0 %
Lymphocytes Relative: 22 %
Lymphs Abs: 0.7 10*3/uL (ref 0.7–4.0)
MCH: 29.9 pg (ref 26.0–34.0)
MCHC: 32.9 g/dL (ref 30.0–36.0)
MCV: 90.9 fL (ref 80.0–100.0)
Monocytes Absolute: 0.2 10*3/uL (ref 0.1–1.0)
Monocytes Relative: 7 %
Neutro Abs: 1.9 10*3/uL (ref 1.7–7.7)
Neutrophils Relative %: 64 %
Platelets: 130 10*3/uL — ABNORMAL LOW (ref 150–400)
RBC: 4.18 MIL/uL — ABNORMAL LOW (ref 4.22–5.81)
RDW: 14.4 % (ref 11.5–15.5)
WBC: 2.9 10*3/uL — ABNORMAL LOW (ref 4.0–10.5)
nRBC: 0 % (ref 0.0–0.2)

## 2021-06-08 MED ORDER — DENOSUMAB 60 MG/ML ~~LOC~~ SOSY
60.0000 mg | PREFILLED_SYRINGE | Freq: Once | SUBCUTANEOUS | Status: AC
  Administered 2021-06-08: 60 mg via SUBCUTANEOUS

## 2021-06-08 NOTE — Patient Instructions (Signed)
Denosumab injection What is this medication? DENOSUMAB (den oh sue mab) slows bone breakdown. Prolia is used to treat osteoporosis in women after menopause and in men, and in people who are taking corticosteroids for 6 months or more. Delton See is used to treat a high calcium level due to cancer and to prevent bone fractures and other bone problems caused by multiple myeloma or cancer bone metastases. Delton See is also used totreat giant cell tumor of the bone. This medicine may be used for other purposes; ask your health care provider orpharmacist if you have questions. COMMON BRAND NAME(S): Prolia, XGEVA What should I tell my care team before I take this medication? They need to know if you have any of these conditions: dental disease having surgery or tooth extraction infection kidney disease low levels of calcium or Vitamin D in the blood malnutrition on hemodialysis skin conditions or sensitivity thyroid or parathyroid disease an unusual reaction to denosumab, other medicines, foods, dyes, or preservatives pregnant or trying to get pregnant breast-feeding How should I use this medication? This medicine is for injection under the skin. It is given by a health careprofessional in a hospital or clinic setting. A special MedGuide will be given to you before each treatment. Be sure to readthis information carefully each time. For Prolia, talk to your pediatrician regarding the use of this medicine in children. Special care may be needed. For Delton See, talk to your pediatrician regarding the use of this medicine in children. While this drug may be prescribed for children as young as 13 years for selected conditions,precautions do apply. Overdosage: If you think you have taken too much of this medicine contact apoison control center or emergency room at once. NOTE: This medicine is only for you. Do not share this medicine with others. What if I miss a dose? It is important not to miss your dose. Call  your doctor or health careprofessional if you are unable to keep an appointment. What may interact with this medication? Do not take this medicine with any of the following medications: other medicines containing denosumab This medicine may also interact with the following medications: medicines that lower your chance of fighting infection steroid medicines like prednisone or cortisone This list may not describe all possible interactions. Give your health care provider a list of all the medicines, herbs, non-prescription drugs, or dietary supplements you use. Also tell them if you smoke, drink alcohol, or use illegaldrugs. Some items may interact with your medicine. What should I watch for while using this medication? Visit your doctor or health care professional for regular checks on your progress. Your doctor or health care professional may order blood tests andother tests to see how you are doing. Call your doctor or health care professional for advice if you get a fever, chills or sore throat, or other symptoms of a cold or flu. Do not treat yourself. This drug may decrease your body's ability to fight infection. Try toavoid being around people who are sick. You should make sure you get enough calcium and vitamin D while you are taking this medicine, unless your doctor tells you not to. Discuss the foods you eatand the vitamins you take with your health care professional. See your dentist regularly. Brush and floss your teeth as directed. Before youhave any dental work done, tell your dentist you are receiving this medicine. Do not become pregnant while taking this medicine or for 5 months after stopping it. Talk with your doctor or health care professional about your  birth control options while taking this medicine. Women should inform their doctor if they wish to become pregnant or think they might be pregnant. There is a potential for serious side effects to an unborn child. Talk to your health  careprofessional or pharmacist for more information. What side effects may I notice from receiving this medication? Side effects that you should report to your doctor or health care professionalas soon as possible: allergic reactions like skin rash, itching or hives, swelling of the face, lips, or tongue bone pain breathing problems dizziness jaw pain, especially after dental work redness, blistering, peeling of the skin signs and symptoms of infection like fever or chills; cough; sore throat; pain or trouble passing urine signs of low calcium like fast heartbeat, muscle cramps or muscle pain; pain, tingling, numbness in the hands or feet; seizures unusual bleeding or bruising unusually weak or tired Side effects that usually do not require medical attention (report to yourdoctor or health care professional if they continue or are bothersome): constipation diarrhea headache joint pain loss of appetite muscle pain runny nose tiredness upset stomach This list may not describe all possible side effects. Call your doctor for medical advice about side effects. You may report side effects to FDA at1-800-FDA-1088. Where should I keep my medication? This medicine is only given in a clinic, doctor's office, or other health caresetting and will not be stored at home. NOTE: This sheet is a summary. It may not cover all possible information. If you have questions about this medicine, talk to your doctor, pharmacist, orhealth care provider.  2022 Elsevier/Gold Standard (2018-04-17 16:10:44)

## 2021-06-08 NOTE — Progress Notes (Signed)
Pt's calcium level is 9.0 and is here for prolia, MD Lorenso Courier confirmed pt can still receive inj today.

## 2021-06-11 LAB — KAPPA/LAMBDA LIGHT CHAINS
Kappa free light chain: 30.2 mg/L — ABNORMAL HIGH (ref 3.3–19.4)
Kappa, lambda light chain ratio: 1.17 (ref 0.26–1.65)
Lambda free light chains: 25.9 mg/L (ref 5.7–26.3)

## 2021-06-12 LAB — MULTIPLE MYELOMA PANEL, SERUM
Albumin SerPl Elph-Mcnc: 3.7 g/dL (ref 2.9–4.4)
Albumin/Glob SerPl: 1.5 (ref 0.7–1.7)
Alpha 1: 0.2 g/dL (ref 0.0–0.4)
Alpha2 Glob SerPl Elph-Mcnc: 0.7 g/dL (ref 0.4–1.0)
B-Globulin SerPl Elph-Mcnc: 0.9 g/dL (ref 0.7–1.3)
Gamma Glob SerPl Elph-Mcnc: 0.9 g/dL (ref 0.4–1.8)
Globulin, Total: 2.6 g/dL (ref 2.2–3.9)
IgA: 126 mg/dL (ref 61–437)
IgG (Immunoglobin G), Serum: 892 mg/dL (ref 603–1613)
IgM (Immunoglobulin M), Srm: 26 mg/dL (ref 15–143)
M Protein SerPl Elph-Mcnc: 0.1 g/dL — ABNORMAL HIGH
Total Protein ELP: 6.3 g/dL (ref 6.0–8.5)

## 2021-06-18 ENCOUNTER — Encounter: Payer: Self-pay | Admitting: Adult Health

## 2021-06-18 ENCOUNTER — Telehealth: Payer: Self-pay | Admitting: Adult Health

## 2021-06-18 NOTE — Telephone Encounter (Signed)
Received call from patient and returned his call.  He has opted not to receive Evusheld.  He has completed his 2 vaccines and two booster.  I reviewed with him that we wills ee him on 7/14 for lab and 7/21 for f/u with Dr. Irene Limbo.    We did review his multiple myeloma labs and recent M spikes today.  He has some lower back pain, and has seen ortho in the past and plans on follow up with Dr. Irene Limbo and Dr. Brigitte Pulse about this today.     Time spent 10 minutes.   Wilber Bihari, NP

## 2021-06-19 ENCOUNTER — Other Ambulatory Visit: Payer: Self-pay | Admitting: Hematology

## 2021-06-19 DIAGNOSIS — C9001 Multiple myeloma in remission: Secondary | ICD-10-CM

## 2021-06-20 ENCOUNTER — Encounter: Payer: Self-pay | Admitting: Hematology

## 2021-06-20 ENCOUNTER — Other Ambulatory Visit: Payer: Self-pay

## 2021-06-22 ENCOUNTER — Other Ambulatory Visit: Payer: Medicare Other

## 2021-06-27 DIAGNOSIS — K862 Cyst of pancreas: Secondary | ICD-10-CM | POA: Diagnosis not present

## 2021-06-29 ENCOUNTER — Ambulatory Visit: Payer: Medicare Other | Admitting: Hematology

## 2021-07-02 ENCOUNTER — Other Ambulatory Visit: Payer: Self-pay | Admitting: Surgery

## 2021-07-02 DIAGNOSIS — K862 Cyst of pancreas: Secondary | ICD-10-CM

## 2021-07-04 ENCOUNTER — Other Ambulatory Visit: Payer: Self-pay

## 2021-07-04 DIAGNOSIS — C9000 Multiple myeloma not having achieved remission: Secondary | ICD-10-CM

## 2021-07-05 ENCOUNTER — Other Ambulatory Visit: Payer: Self-pay

## 2021-07-05 ENCOUNTER — Inpatient Hospital Stay: Payer: Medicare Other | Attending: Hematology

## 2021-07-05 DIAGNOSIS — G620 Drug-induced polyneuropathy: Secondary | ICD-10-CM | POA: Diagnosis not present

## 2021-07-05 DIAGNOSIS — G8929 Other chronic pain: Secondary | ICD-10-CM | POA: Diagnosis not present

## 2021-07-05 DIAGNOSIS — T451X5A Adverse effect of antineoplastic and immunosuppressive drugs, initial encounter: Secondary | ICD-10-CM | POA: Insufficient documentation

## 2021-07-05 DIAGNOSIS — M549 Dorsalgia, unspecified: Secondary | ICD-10-CM | POA: Insufficient documentation

## 2021-07-05 DIAGNOSIS — Z79899 Other long term (current) drug therapy: Secondary | ICD-10-CM | POA: Insufficient documentation

## 2021-07-05 DIAGNOSIS — Z7901 Long term (current) use of anticoagulants: Secondary | ICD-10-CM | POA: Insufficient documentation

## 2021-07-05 DIAGNOSIS — I480 Paroxysmal atrial fibrillation: Secondary | ICD-10-CM | POA: Insufficient documentation

## 2021-07-05 DIAGNOSIS — Z85820 Personal history of malignant melanoma of skin: Secondary | ICD-10-CM | POA: Insufficient documentation

## 2021-07-05 DIAGNOSIS — D72819 Decreased white blood cell count, unspecified: Secondary | ICD-10-CM | POA: Insufficient documentation

## 2021-07-05 DIAGNOSIS — Z8673 Personal history of transient ischemic attack (TIA), and cerebral infarction without residual deficits: Secondary | ICD-10-CM | POA: Insufficient documentation

## 2021-07-05 DIAGNOSIS — N281 Cyst of kidney, acquired: Secondary | ICD-10-CM | POA: Diagnosis not present

## 2021-07-05 DIAGNOSIS — C9000 Multiple myeloma not having achieved remission: Secondary | ICD-10-CM | POA: Diagnosis not present

## 2021-07-05 LAB — CBC WITH DIFFERENTIAL (CANCER CENTER ONLY)
Abs Immature Granulocytes: 0.01 10*3/uL (ref 0.00–0.07)
Basophils Absolute: 0.1 10*3/uL (ref 0.0–0.1)
Basophils Relative: 3 %
Eosinophils Absolute: 0.1 10*3/uL (ref 0.0–0.5)
Eosinophils Relative: 3 %
HCT: 36.6 % — ABNORMAL LOW (ref 39.0–52.0)
Hemoglobin: 12.4 g/dL — ABNORMAL LOW (ref 13.0–17.0)
Immature Granulocytes: 1 %
Lymphocytes Relative: 33 %
Lymphs Abs: 0.7 10*3/uL (ref 0.7–4.0)
MCH: 30.2 pg (ref 26.0–34.0)
MCHC: 33.9 g/dL (ref 30.0–36.0)
MCV: 89.1 fL (ref 80.0–100.0)
Monocytes Absolute: 0.2 10*3/uL (ref 0.1–1.0)
Monocytes Relative: 11 %
Neutro Abs: 1 10*3/uL — ABNORMAL LOW (ref 1.7–7.7)
Neutrophils Relative %: 49 %
Platelet Count: 107 10*3/uL — ABNORMAL LOW (ref 150–400)
RBC: 4.11 MIL/uL — ABNORMAL LOW (ref 4.22–5.81)
RDW: 14.7 % (ref 11.5–15.5)
WBC Count: 2.1 10*3/uL — ABNORMAL LOW (ref 4.0–10.5)
nRBC: 0 % (ref 0.0–0.2)

## 2021-07-05 LAB — CMP (CANCER CENTER ONLY)
ALT: 9 U/L (ref 0–44)
AST: 20 U/L (ref 15–41)
Albumin: 3.3 g/dL — ABNORMAL LOW (ref 3.5–5.0)
Alkaline Phosphatase: 52 U/L (ref 38–126)
Anion gap: 6 (ref 5–15)
BUN: 17 mg/dL (ref 8–23)
CO2: 28 mmol/L (ref 22–32)
Calcium: 8.9 mg/dL (ref 8.9–10.3)
Chloride: 106 mmol/L (ref 98–111)
Creatinine: 1.16 mg/dL (ref 0.61–1.24)
GFR, Estimated: >60 mL/min (ref >60)
Glucose, Bld: 125 mg/dL — ABNORMAL HIGH (ref 70–99)
Potassium: 4.4 mmol/L (ref 3.5–5.1)
Sodium: 140 mmol/L (ref 135–145)
Total Bilirubin: 0.7 mg/dL (ref 0.3–1.2)
Total Protein: 6.1 g/dL — ABNORMAL LOW (ref 6.5–8.1)

## 2021-07-06 LAB — KAPPA/LAMBDA LIGHT CHAINS
Kappa free light chain: 28.8 mg/L — ABNORMAL HIGH (ref 3.3–19.4)
Kappa, lambda light chain ratio: 1.19 (ref 0.26–1.65)
Lambda free light chains: 24.3 mg/L (ref 5.7–26.3)

## 2021-07-11 NOTE — Progress Notes (Signed)
HEMATOLOGY/ONCOLOGY CLINIC NOTE  Date of Service: 07/11/2021   Patient Care Team: Justin Neer, MD as PCP - General (Family Medicine) Justin Pickett Nadean Corwin, MD as PCP - Cardiology (Cardiology)   CHIEF COMPLAINTS/:  F/u for continued mx of Myeloma   HISTORY OF PRESENTING ILLNESS:  Justin [name] is a wonderful 80 y.o. 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  Patient has a history of hypertension, diabetes, GERD, gout, squamous cell skin cancers, elevated PSA, SVT status post ablation in May 2016, chronic back pain who has a history of recurrent CVA/TIA's. He apparently had a left hemispheric TIA June 2017 and has had recurrent) subcortical infarcts thought to be related to small vessel disease. He was noted to have a small PFO that was of questionable significance. He was initially on aspirin and then continued and aspirin + plavix for secondary stroke prevention. He follows with Dr. Antony [name] for his neurology cares.  An SPEP was done due to elevated total proteins of 8.9 and showed an M spike of 2 g/dL. He was also noted to have an elevated total protein of 8.6 in April 2017. He was referred to [place] for further evaluation of his M spike. Blood test did not reveal any hypercalcemia, no significant renal failure. He has been noted to have developed anemia since June the serum and his hemoglobin was 12.6 and is now down to the mid 10 range. He notes no focal bone pains at this time. Overt acute weight loss. No fevers no chills no night sweats.   CURRENT THERAPY:   Revlimid maintenance 5 mg po 3 weeks on 1 week off.   INTERVAL HISTORY:  Justin [name] was seen today for follow-up of his myeloma. The patient's last visit with [place] was on 05/03/2021. The pt reports that he is doing well overall.  The pt reports no acute new symptoms. He notes some  stress from his daughters medical issues. No new bone pains. Chronic back  pain and radiculopathy symptoms .  Lab results today 07/05/2021 of CBC w/diff and CMP is as follows: all values are WNL except for WBC of 2.1K, RBC of 4.11, Hgb of 12.4, HCT of 36.6, Plt of 107K, Neutro Abs of 1.0K, Glucose of 125, Total protein of 6.1, Albumin of 3.3. 07/05/2021 Kappa free light chain of 28.8.   On review of systems, pt reports no acute new symptoms.   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 s/p RFCA on 05/01/15   Stroke ([place]) 2017   TIA and mild stroke; denies stroke symptoms since then     SURGICAL HISTORY: Past Surgical History:  Procedure Laterality Date   ATRIAL FIBRILLATION ABLATION     Dr. Lovena [name]   BASAL CELL CARCINOMA EXCISION     off of back   ELECTROPHYSIOLOGIC STUDY N/A 05/01/2015   Procedure: SVT Ablation;  Surgeon: Justin Lance, MD;  Location: [facility];  Service: Cardiovascular;  Laterality: N/A;  EP IMPLANTABLE DEVICE N/A 06/04/2016   Procedure: Loop Recorder Insertion;  Surgeon: Justin Grayer, MD;  Location: [facility];  Service: Cardiovascular;  Laterality: N/A;   INGUINAL HERNIA REPAIR Bilateral 06/16/2017   Procedure: LAPAROSCOPIC BILATERAL INGUINAL HERNIA REPAIR;  Surgeon: Justin Riley, MD;  Location: [facility];  Service: General;  Laterality: Bilateral;   INSERTION OF MESH Bilateral 06/16/2017   Procedure: INSERTION OF MESH;  Surgeon: Justin Riley, MD;  Location: [facility];  Service: General;  Laterality: Bilateral;   MASS EXCISION Right 07/28/2018   Procedure: EXCISION OF RIGHT FOREARM MASS;  Surgeon: Justin Riley, MD;  Location: [facility];  Service: General;   Laterality: Right;   SQUAMOUS CELL CARCINOMA EXCISION     TEE WITHOUT CARDIOVERSION N/A 06/04/2016   Procedure: TRANSESOPHAGEAL ECHOCARDIOGRAM (TEE);  Surgeon: Justin Casino, MD;  Location: [facility];  Service: Cardiovascular;  Laterality: N/A;    SOCIAL HISTORY: Social History   Socioeconomic History   Marital status: Married    Spouse name: Not on file   Number of children: Not on file   Years of education: Not on file   Highest education level: Not on file  Occupational History   Not on file  Tobacco Use   Smoking status: Never   Smokeless tobacco: Never  Vaping Use   Vaping Use: Never used  Substance and Sexual Activity   Alcohol use: No   Drug use: No   Sexual activity: Not on file  Other Topics Concern   Not on file  Social History Narrative   Not on file   Social Determinants of Health   Financial Resource Strain: Not on file  Food Insecurity: Not on file  Transportation Needs: Not on file  Physical Activity: Not on file  Stress: Not on file  Social Connections: Not on file  Intimate Partner Violence: Not on file   Justin [name] is retired. His daughter is Justin [name] for a [name] home in [place], [place].    FAMILY HISTORY: Family History  Problem Relation Age of Onset   Stroke Mother    Hypertension Mother    Alzheimer's disease Father    Heart failure Father    Down syndrome Daughter     ALLERGIES:  is allergic to other and no known allergies.  MEDICATIONS:  Current Outpatient Medications  Medication Sig Dispense Refill   allopurinol (ZYLOPRIM) 100 MG tablet Take by mouth. (Patient not taking: Reported on 05/03/2021)     Ascorbic Acid (VITAMIN C) 1000 MG tablet 1 tablet     atorvastatin (LIPITOR) 10 MG tablet Take 10 mg by mouth daily.     Coenzyme Q10 (CO Q-10) 100 MG CAPS See admin instructions.     cyanocobalamin 1000 MCG tablet Take 1,000 mcg by mouth daily.     Ferrous Sulfate (IRON) 90 (18 Fe) MG TABS Take by mouth. (Patient not  taking: No sig reported)     FREESTYLE LITE test strip      glucose monitoring kit (FREESTYLE) monitoring kit 1 each by Does not apply route as needed for other.     Lancets (FREESTYLE) lancets 1 each daily.     lenalidomide (REVLIMID) 5 MG capsule TAKE 1 CAPSULE DAILY FOR 21 DAYS ON AND 7 DAYS OFF, REPEAT EVERY 28 DAYS 21 capsule 0   Magnesium 400 MG TABS 1 tablet with a meal     metFORMIN (GLUCOPHAGE) 1000 MG tablet Take 0.5 tablets (500 mg total) by mouth daily with  breakfast. 90 tablet 3   mupirocin cream (BACTROBAN) 2 % Apply 1 application topically 2 (two) times daily. To sore/boil over the back (Patient not taking: Reported on 05/03/2021) 15 g 1   nitroGLYCERIN (NITROSTAT) 0.4 MG SL tablet Place 1 tablet (0.4 mg total) under the tongue every 5 (five) minutes as needed for chest pain (Max 3 doses in 15 mins.). 30 tablet 0   omeprazole (PRILOSEC) 20 MG capsule Take 20 mg by mouth every morning.      Potassium 99 MG TABS 1 tablet     pregabalin (LYRICA) 75 MG capsule Take 75 mg by mouth 3 (three) times daily.     rivaroxaban (XARELTO) 20 MG TABS tablet TAKE 1 TABLET DAILY WITH SUPPER 90 tablet 1   Vitamin D, Ergocalciferol, (DRISDOL) 1.25 MG (50000 UNIT) CAPS capsule TAKE 1 CAPSULE BY MOUTH 1 TIME A WEEK 12 capsule 3   No current facility-administered medications for this visit.    REVIEW OF SYSTEMS:   10 Point review of Systems was done is negative except as noted above.   PHYSICAL EXAMINATION:   ECOG PERFORMANCE STATUS: 2 - Symptomatic, <50% confined to bed There were no vitals filed for this visit.   Wt Readings from Last 3 Encounters:  05/03/21 196 lb 6.4 oz (89.1 kg)  02/09/21 189 lb (85.7 kg)  01/03/21 186 lb (84.4 kg)   There is no height or weight on file to calculate BMI.   NAD GENERAL:alert, in no acute distress and comfortable SKIN: no acute rashes, no significant lesions EYES: conjunctiva are pink and non-injected, sclera anicteric OROPHARYNX: MMM, no exudates, no  oropharyngeal erythema or ulceration NECK: supple, no JVD LYMPH:  no palpable lymphadenopathy in the cervical, axillary or inguinal regions LUNGS: clear to auscultation b/l with normal respiratory effort HEART: regular rate & rhythm ABDOMEN:  normoactive bowel sounds , non tender, not distended. Extremity: no pedal edema PSYCH: alert & oriented x 3 with fluent speech NEURO: no focal motor/sensory deficits   LABORATORY DATA:  I have reviewed the data as listed. CBC Latest Ref Rng & Units 07/05/2021 06/08/2021 04/26/2021  WBC 4.0 - 10.5 K/uL 2.1(L) 2.9(L) 2.9(L)  Hemoglobin 13.0 - 17.0 g/dL 12.4(L) 12.5(L) 12.6(L)  Hematocrit 39.0 - 52.0 % 36.6(L) 38.0(L) 38.5(L)  Platelets 150 - 400 K/uL 107(L) 130(L) 119(L)  ANC 1k  CMP Latest Ref Rng & Units 07/05/2021 06/08/2021 04/26/2021  Glucose 70 - 99 mg/dL 125(H) 130(H) 126(H)  BUN 8 - 23 mg/dL _0 Creatinine 0.61 - 1.24 mg/dL 1.16 1.29(H) 1.13  Sodium 135 - 145 mmol/L 140 139 142  Potassium 3.5 - 5.1 mmol/L 4.4 4.8 4.4  Chloride 98 - 111 mmol/L 106 105 111  CO2 22 - 32 mmol/L _1 Calcium 8.9 - 10.3 mg/dL 8.9 9.0 8.5(L)  Total Protein 6.5 - 8.1 g/dL 6.1(L) 6.7 6.5  Total Bilirubin 0.3 - 1.2 mg/dL 0.7 0.9 0.7  Alkaline Phos 38 - 126 U/L 52 50 63  AST 15 - 41 U/L _2 ALT 0 - 44 U/L _3 Lab Results  Component Value Date   IRON 45 06/02/2017   TIBC 233 06/02/2017   IRONPCTSAT 19 (L) 06/02/2017   (Iron and TIBC)  Lab Results  Component Value Date   FERRITIN 308 08/26/2017         RADIOGRAPHIC STUDIES: I have personally reviewed the radiological images as listed and agreed with the findings in the  report.       Bone Marrow Biopsy 12/24/2016 (Accession [id number])  Diagnosis Bone Marrow, Aspirate,Biopsy, and Clot, left iliac crest - HYPERCELLULAR BONE MARROW FOR AGE WITH PLASMA CELL NEOPLASM. - SEE COMMENT. PERIPHERAL BLOOD: - NORMOCYTIC-NORMOCHROMIC ANEMIA. - LEUKOPENIA.  DG Bone Survey Met  (Accession [id number]) (Order [id number])  Imaging  Date: 11/28/2016 Department: [facility]RAD[facility] Released By: Pricilla Riffle Authorizing: Brunetta Genera, MD  Exam Information   Status Exam Begun  Exam Ended   Final [99] 11/28/2016 11:59 AM 11/28/2016 12:36 PM  PACS Images   Show images for DG Bone Survey Met  Study Result   CLINICAL DATA:  Monoclonal gammopathy of unknown significance. History of squamous cell carcinoma excision, basal cell carcinoma excision.   EXAM: METASTATIC BONE SURVEY   COMPARISON:  CT neck, chest, abdomen and pelvis from 10/29/16, MRI of the head from 06/01/2016, CXR 10/27/2016, lumbar spine radiographs 06/24/2016   FINDINGS: Lateral skull: Small occipital lucency which may represent a normal arachnoid granulation posteriorly in the occiput. No definite lytic abnormality.   Cervical spine AP and lateral: Disc space narrowing at C2-3, and from C4 through C7. C4-5, C5-6 and C6-7 uncovertebral joint spurring bilaterally. No lytic abnormality.   Thoracic spine AP and lateral: T8-9 right-sided osteophytes and to a lesser degree T9-10. No lytic abnormality. Slight multilevel lumbar thoracic disc space narrowing likely degenerative.   Lumbar spine AP and lateral: Disc space narrowing at L4-5 and to a greater extent L5-S1. No lytic disease. L3 through S1 facet sclerosis.   AP pelvis: Negative for lytic disease.   Bilateral upper and lower extremities shoulders through wrist and from the hips through ankle: Negative for lytic disease. Joint space narrowing of the femorotibial compartments both knees. Subchondral cyst of the right patella.   CXR: Clear lungs. Cardiac implantable monitoring device projects over the left heart. Aortic atherosclerosis. No lytic disease.   IMPRESSION: No findings suspicious for lytic disease.     Electronically Signed   By: Ashley Royalty M.D.   On: 11/28/2016 14:58      ASSESSMENT & PLAN:   80 y.o. 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 (12/2016) Peak M spike 2.6  Cytogenetics and Myeloma FISH panel.- trisomy 11 Patient has normocytic anemia without any other clear etiology. (>2g/dl lower that lower limit of normal which is 13) which per criteria would place this in the Multiple myeloma category as opposed to Smoldering Multiple myeloma (Borderline criterion)  No overt renal failure or hypercalcemia at this time No focal bone pains though he has significant chronic back pain related to degenerative disc disease. Bone survey shows no overt lytic lesions.  10/16/18 CT Coronary Morph revealed Coronary artery calcium score 299 Agatston units, this places the patient in the 47th percentile for age and gender, suggesting intermediate risk for future cardiac events. 2. Nonobstructive coronary disease.  12/08/18 Bone Density study revealed that the pt has osteoporosis   07/05/2019 shows an M protein of 0.1.  2) Anemia and thrombocytopenia -- related to treatment (Revlimid) -stable  3) Grade 2 Neuropathy -- in b/l feet and halfway up lower leg without pain -Seconadry to [employer] -improved off [place]  5) Right kidney cyst with nodularity -Reviewed the 01/05/19 US Abdomen which revealed Septated cyst arising from lower pole right kidney with a questionable solid focus within this lesion. This nodular area potentially could represent a small neoplasm  developing within this complex cyst. Given this circumstance, pre and post contrast MR of the kidneys is advised to further evaluate. If there is a contraindication to MR, pre and post-contrast CT would be a reasonable alternative to further assess. Study otherwise unremarkable. -Pt will likely need urology referral and MRI, pt would like to discuss this further with his PCP before triggering a referral at this point  6) Borderline low  B12 levels, previously 299 --- on replacement -  -05/05/19 Vitamin B12 at 586 -continue replacement.   7) recurrent CVA - thought to be related to small vessel disease as per neurology. Has a small PFO which could be an additional risk factor as well as his afib  8)  P afib Plan -Continue on Xarelto per cardiology.  9) H/o recurrent SCC -Underwent surgical excision on right forearm with Dr. Romana Juniper on 07/28/18. His surgical pathology showed evidence of Squamous Cell Carcinoma. Plan -no evidence of recurrence locally at the site of surgery at this time. -continue dermatology followup  10) Lower extremity discomfort - improved, stable. Likely Discogenic pain -[place] on 12/2017 revealed no blood clot -Pt has established care with orthopedist and PT.  11) Patient Active Problem List   Diagnosis Date Noted   Osteoporosis 03/10/2019   Multiple myeloma without remission ([place]) 03/10/2019   Chest pain 09/27/2018   Multiple myeloma ([place]) 09/27/2018   Degeneration of lumbar intervertebral disc 01/30/2018   Low back pain 01/30/2018   Lumbar radiculopathy 01/30/2018   History of loop recorder 06/12/2017   Cryptogenic stroke ([place]) 10/28/2016   Gait abnormality    History of CVA (cerebrovascular accident)    History of TIA (transient ischemic attack)    Benign essential HTN    Paroxysmal SVT (supraventricular tachycardia) (HCC)    Acute blood loss anemia    Acute ischemic stroke ([place])    CVA (cerebral vascular accident) ([place]) 10/26/2016   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   History of recent stroke 06/06/2016   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   TIA (transient ischemic attack) 06/02/2016   Numbness 06/01/2016   Right arm numbness 06/01/2016   Atherosclerosis of native coronary artery of native heart without angina pectoris 03/20/2016   Hypertension    GERD (gastroesophageal reflux disease)    Gout    S/P RF ablation operation for arrhythmia 12/13/2014   Hyperlipidemia 12/13/2014    Controlled type 2 diabetes mellitus with complication, without long-term current use of insulin ([place]) 12/07/2014    PLAN: -Discussed pt labwork, 7/14/2022hgb stable at 12.4, mild leucopenia due to Revlimid  Last M protein 6/17-- stable at 0.1g/dl -No lab or clinical evidence of Multiple Myeloma progression at this time. Will continue to monitor. -The pt has no prohibitive toxicities from continuing 5 mg Revlimid 3 weeks on, 1 week off at this time. -Continue Prolia q37month  FOLLOW UP: Labs in 7 weeks RTC with Dr KIrene Limboin 8 weeks   The total time spent in the appointment was 20 minutes and more than 50% was on counseling and direct patient cares.  All of the patient's questions were answered with apparent satisfaction. The patient knows to call the clinic with any problems, questions or concerns.   GSullivan LoneMD MBarnwellAAHIVMS SAustin Gi S[facility] ICKindred Hospital - GreensboroHematology/Oncology Physician CGundersen [facility] (Office):       [phone](Work cell):  [phone](Fax):           [phone] I, RReinaldo Raddle am acting as scribe for Dr. GSullivan Lone MD.    .  I have reviewed the above documentation for accuracy and completeness, and I agree with the above. Brunetta Genera MD

## 2021-07-12 ENCOUNTER — Inpatient Hospital Stay (HOSPITAL_BASED_OUTPATIENT_CLINIC_OR_DEPARTMENT_OTHER): Payer: Medicare Other | Admitting: Hematology

## 2021-07-12 ENCOUNTER — Other Ambulatory Visit: Payer: Self-pay

## 2021-07-12 VITALS — BP 111/64 | HR 53 | Temp 98.2°F | Resp 17 | Ht 68.0 in | Wt 193.3 lb

## 2021-07-12 DIAGNOSIS — C9000 Multiple myeloma not having achieved remission: Secondary | ICD-10-CM | POA: Diagnosis not present

## 2021-07-12 DIAGNOSIS — G620 Drug-induced polyneuropathy: Secondary | ICD-10-CM | POA: Diagnosis not present

## 2021-07-12 DIAGNOSIS — G8929 Other chronic pain: Secondary | ICD-10-CM | POA: Diagnosis not present

## 2021-07-12 DIAGNOSIS — Z85820 Personal history of malignant melanoma of skin: Secondary | ICD-10-CM | POA: Diagnosis not present

## 2021-07-12 DIAGNOSIS — D72819 Decreased white blood cell count, unspecified: Secondary | ICD-10-CM | POA: Diagnosis not present

## 2021-07-12 DIAGNOSIS — M549 Dorsalgia, unspecified: Secondary | ICD-10-CM | POA: Diagnosis not present

## 2021-07-16 ENCOUNTER — Other Ambulatory Visit: Payer: Medicare Other

## 2021-07-18 ENCOUNTER — Encounter: Payer: Self-pay | Admitting: Hematology

## 2021-07-19 ENCOUNTER — Other Ambulatory Visit: Payer: Self-pay | Admitting: Hematology

## 2021-07-19 DIAGNOSIS — C9001 Multiple myeloma in remission: Secondary | ICD-10-CM

## 2021-07-31 ENCOUNTER — Other Ambulatory Visit: Payer: Self-pay

## 2021-07-31 MED ORDER — RIVAROXABAN 20 MG PO TABS: ORAL_TABLET | ORAL | 1 refills | Status: DC

## 2021-07-31 NOTE — Telephone Encounter (Signed)
Prescription refill request for Xarelto received.  Last office visit:hilty 01/03/21 Weight:87.7kg Age:2mScr:1.16  CrCl:62

## 2021-08-01 ENCOUNTER — Telehealth: Payer: Self-pay | Admitting: *Deleted

## 2021-08-01 MED ORDER — RIVAROXABAN 20 MG PO TABS: ORAL_TABLET | ORAL | 1 refills | Status: DC

## 2021-08-01 NOTE — Telephone Encounter (Signed)
Prescription refill request for Xarelto received.  Last office visit:hilty 01/03/21 Weight:87.8kg Age:51mScr:1.16 07/05/21 CrCl:52.7

## 2021-08-01 NOTE — Addendum Note (Signed)
Addended by: Allean Found on: 08/01/2021 02:28 PM   Modules accepted: Orders

## 2021-08-01 NOTE — Telephone Encounter (Signed)
Refill request

## 2021-08-06 ENCOUNTER — Telehealth: Payer: Self-pay

## 2021-08-06 NOTE — Telephone Encounter (Signed)
**Note De-Identified  Obfuscation** I did the Xarelto PA through covermymeds and received the following message:  Thaden Abajian Key: [id number] Outcome: Clinical Override not needed Drug: Xarelto '20MG'$  tablets Form: Engineer, manufacturing systems PA Form  I called [facility] back and was advised that they are receiving a message that this refill is being requested too soon as a mail pharmacy filled the pts Xarelto on 07/31/2021. Per [facility] a PA I not required at this time.

## 2021-08-06 NOTE — Telephone Encounter (Signed)
**Note De-Identified  Obfuscation** We received a Xarelto PA request from [employer]. I called [facility] to get the pts ins info: [employer] CB:[id number] BIN: [id number] PCN: [id number] GRP: [id number]

## 2021-08-16 ENCOUNTER — Other Ambulatory Visit: Payer: Self-pay | Admitting: Hematology

## 2021-08-16 DIAGNOSIS — C9001 Multiple myeloma in remission: Secondary | ICD-10-CM

## 2021-08-29 ENCOUNTER — Other Ambulatory Visit: Payer: Self-pay

## 2021-08-29 ENCOUNTER — Inpatient Hospital Stay: Payer: Medicare Other | Attending: Hematology

## 2021-08-29 DIAGNOSIS — Z298 Encounter for other specified prophylactic measures: Secondary | ICD-10-CM | POA: Diagnosis not present

## 2021-08-29 DIAGNOSIS — C9000 Multiple myeloma not having achieved remission: Secondary | ICD-10-CM | POA: Insufficient documentation

## 2021-08-29 LAB — CBC WITH DIFFERENTIAL (CANCER CENTER ONLY)
Abs Immature Granulocytes: 0 10*3/uL (ref 0.00–0.07)
Basophils Absolute: 0.1 10*3/uL (ref 0.0–0.1)
Basophils Relative: 3 %
Eosinophils Absolute: 0.1 10*3/uL (ref 0.0–0.5)
Eosinophils Relative: 5 %
HCT: 39.8 % (ref 39.0–52.0)
Hemoglobin: 13.1 g/dL (ref 13.0–17.0)
Immature Granulocytes: 0 %
Lymphocytes Relative: 29 %
Lymphs Abs: 0.7 10*3/uL (ref 0.7–4.0)
MCH: 29.5 pg (ref 26.0–34.0)
MCHC: 32.9 g/dL (ref 30.0–36.0)
MCV: 89.6 fL (ref 80.0–100.0)
Monocytes Absolute: 0.4 10*3/uL (ref 0.1–1.0)
Monocytes Relative: 16 %
Neutro Abs: 1.2 10*3/uL — ABNORMAL LOW (ref 1.7–7.7)
Neutrophils Relative %: 47 %
Platelet Count: 124 10*3/uL — ABNORMAL LOW (ref 150–400)
RBC: 4.44 MIL/uL (ref 4.22–5.81)
RDW: 14.9 % (ref 11.5–15.5)
WBC Count: 2.5 10*3/uL — ABNORMAL LOW (ref 4.0–10.5)
nRBC: 0 % (ref 0.0–0.2)

## 2021-08-29 LAB — CMP (CANCER CENTER ONLY)
ALT: 9 U/L (ref 0–44)
AST: 19 U/L (ref 15–41)
Albumin: 3.5 g/dL (ref 3.5–5.0)
Alkaline Phosphatase: 59 U/L (ref 38–126)
Anion gap: 9 (ref 5–15)
BUN: 13 mg/dL (ref 8–23)
CO2: 21 mmol/L — ABNORMAL LOW (ref 22–32)
Calcium: 9 mg/dL (ref 8.9–10.3)
Chloride: 108 mmol/L (ref 98–111)
Creatinine: 1.08 mg/dL (ref 0.61–1.24)
GFR, Estimated: >60 mL/min (ref >60)
Glucose, Bld: 119 mg/dL — ABNORMAL HIGH (ref 70–99)
Potassium: 4 mmol/L (ref 3.5–5.1)
Sodium: 138 mmol/L (ref 135–145)
Total Bilirubin: 0.7 mg/dL (ref 0.3–1.2)
Total Protein: 6.4 g/dL — ABNORMAL LOW (ref 6.5–8.1)

## 2021-08-30 ENCOUNTER — Other Ambulatory Visit: Payer: Medicare Other

## 2021-08-30 LAB — KAPPA/LAMBDA LIGHT CHAINS
Kappa free light chain: 27.8 mg/L — ABNORMAL HIGH (ref 3.3–19.4)
Kappa, lambda light chain ratio: 1.14 (ref 0.26–1.65)
Lambda free light chains: 24.3 mg/L (ref 5.7–26.3)

## 2021-09-03 LAB — MULTIPLE MYELOMA PANEL, SERUM
Albumin SerPl Elph-Mcnc: 3.3 g/dL (ref 2.9–4.4)
Albumin/Glob SerPl: 1.4 (ref 0.7–1.7)
Alpha 1: 0.2 g/dL (ref 0.0–0.4)
Alpha2 Glob SerPl Elph-Mcnc: 0.6 g/dL (ref 0.4–1.0)
B-Globulin SerPl Elph-Mcnc: 0.8 g/dL (ref 0.7–1.3)
Gamma Glob SerPl Elph-Mcnc: 0.8 g/dL (ref 0.4–1.8)
Globulin, Total: 2.5 g/dL (ref 2.2–3.9)
IgA: 119 mg/dL (ref 61–437)
IgG (Immunoglobin G), Serum: 872 mg/dL (ref 603–1613)
IgM (Immunoglobulin M), Srm: 22 mg/dL (ref 15–143)
M Protein SerPl Elph-Mcnc: 0.1 g/dL — ABNORMAL HIGH
Total Protein ELP: 5.8 g/dL — ABNORMAL LOW (ref 6.0–8.5)

## 2021-09-06 ENCOUNTER — Inpatient Hospital Stay: Payer: Medicare Other

## 2021-09-06 ENCOUNTER — Other Ambulatory Visit: Payer: Self-pay

## 2021-09-06 ENCOUNTER — Inpatient Hospital Stay (HOSPITAL_BASED_OUTPATIENT_CLINIC_OR_DEPARTMENT_OTHER): Payer: Medicare Other | Admitting: Hematology

## 2021-09-06 VITALS — BP 135/70 | HR 65 | Temp 98.0°F | Resp 18 | Ht 68.0 in | Wt 193.4 lb

## 2021-09-06 DIAGNOSIS — C9000 Multiple myeloma not having achieved remission: Secondary | ICD-10-CM

## 2021-09-06 DIAGNOSIS — Z298 Encounter for other specified prophylactic measures: Secondary | ICD-10-CM | POA: Diagnosis not present

## 2021-09-06 MED ORDER — TIXAGEVIMAB (PART OF EVUSHELD) INJECTION
300.0000 mg | Freq: Once | INTRAMUSCULAR | Status: AC
  Administered 2021-09-06: 300 mg via INTRAMUSCULAR
  Filled 2021-09-06: qty 3

## 2021-09-06 MED ORDER — CILGAVIMAB (PART OF EVUSHELD) INJECTION
300.0000 mg | Freq: Once | INTRAMUSCULAR | Status: AC
  Administered 2021-09-06: 300 mg via INTRAMUSCULAR
  Filled 2021-09-06: qty 3

## 2021-09-06 NOTE — Progress Notes (Signed)
Pt observed 30 min post Evusheild. VSS at discharge. No complaints or s/s or reaction

## 2021-09-09 MED ORDER — VALACYCLOVIR HCL 1 G PO TABS: 1000.0000 mg | ORAL_TABLET | Freq: Two times a day (BID) | ORAL | 0 refills | Status: AC

## 2021-09-09 MED ORDER — AMOXICILLIN-POT CLAVULANATE 875-125 MG PO TABS: 1.0000 | ORAL_TABLET | Freq: Two times a day (BID) | ORAL | 0 refills | Status: AC

## 2021-09-12 ENCOUNTER — Encounter: Payer: Self-pay | Admitting: Hematology

## 2021-09-12 NOTE — Progress Notes (Signed)
HEMATOLOGY/ONCOLOGY CLINIC NOTE  Date of Service: 09/12/2021   Patient Care Team: Mayra Neer, MD as PCP - General (Family Medicine) Debara Pickett Nadean Corwin, MD as PCP - Cardiology (Cardiology)   CHIEF COMPLAINTS/:  F/u for continued mx of Myeloma   HISTORY OF PRESENTING ILLNESS:  Justin [name] is a wonderful 80 y.o. 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  Patient has a history of hypertension, diabetes, GERD, gout, squamous cell skin cancers, elevated PSA, SVT status post ablation in May 2016, chronic back pain who has a history of recurrent CVA/TIA's. He apparently had a left hemispheric TIA June 2017 and has had recurrent) subcortical infarcts thought to be related to small vessel disease. He was noted to have a small PFO that was of questionable significance. He was initially on aspirin and then continued and aspirin + plavix for secondary stroke prevention. He follows with Dr. Antony Contras for his neurology cares.  An SPEP was done due to elevated total proteins of 8.9 and showed an M spike of 2 g/dL. He was also noted to have an elevated total protein of 8.6 in April 2017. He was referred to [place] for further evaluation of his M spike. Blood test did not reveal any hypercalcemia, no significant renal failure. He has been noted to have developed anemia since June the serum and his hemoglobin was 12.6 and is now down to the mid 10 range. He notes no focal bone pains at this time. Overt acute weight loss. No fevers no chills no night sweats.   CURRENT THERAPY:   Revlimid maintenance 5 mg po 3 weeks on 1 week off.   INTERVAL HISTORY:  [name] was seen today for follow-up of his myeloma. The patient's last visit with [place] was on [id number]. The pt reports that he is doing well overall.  The pt reports no acute new symptoms. No new bone pains. Chronic back pain and radiculopathy symptoms .  Lab results today  09/06/2021 reviewed with the patient. CBC unremarkable other than a WBC count of 2.5k and platelet count of 124k CMP stable Myeloma panel shows a stable M spike of 0.1 g/dL Serum kappa lambda free light chain ratio is within normal limits.  He reports that he was seen by Dr. Zenia Resides from surgery for pancreatic cysts and she will be following with serial MRIs to monitor these.  He notes he plans to travel to [place] in November.  On review of systems, pt reports no acute new symptoms.   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 s/p RFCA on 05/01/15   Stroke ([place]) 2017   TIA and mild stroke; denies stroke symptoms since then     SURGICAL HISTORY: Past Surgical History:  Procedure Laterality Date   ATRIAL FIBRILLATION ABLATION     Dr. Lovena Le   BASAL CELL CARCINOMA EXCISION     off of back   ELECTROPHYSIOLOGIC STUDY N/A 05/01/2015   Procedure: SVT Ablation;  Surgeon: Carleene Overlie  Peyton Najjar, MD;  Location: [facility];  Service: Cardiovascular;  Laterality: N/A;   EP IMPLANTABLE DEVICE N/A 06/04/2016   Procedure: Loop Recorder Insertion;  Surgeon: Thompson Grayer, MD;  Location: [facility];  Service: Cardiovascular;  Laterality: N/A;   INGUINAL HERNIA REPAIR Bilateral 06/16/2017   Procedure: LAPAROSCOPIC BILATERAL INGUINAL HERNIA REPAIR;  Surgeon: Clovis Riley, MD;  Location: [facility];  Service: General;  Laterality: Bilateral;   INSERTION OF MESH Bilateral 06/16/2017   Procedure: INSERTION OF MESH;  Surgeon: Clovis Riley, MD;  Location: [facility];  Service: General;  Laterality: Bilateral;   MASS EXCISION Right  07/28/2018   Procedure: EXCISION OF RIGHT FOREARM MASS;  Surgeon: Clovis Riley, MD;  Location: [facility];  Service: General;  Laterality: Right;   SQUAMOUS CELL CARCINOMA EXCISION     TEE WITHOUT CARDIOVERSION N/A 06/04/2016   Procedure: TRANSESOPHAGEAL ECHOCARDIOGRAM (TEE);  Surgeon: Pixie Casino, MD;  [facility];  Service: Cardiovascular;  Laterality: N/A;    SOCIAL HISTORY: Social History   Socioeconomic History   Marital status: Married    Spouse name: Not on file   Number of children: Not on file   Years of education: Not on file   Highest education level: Not on file  Occupational History   Not on file  Tobacco Use   Smoking status: Never   Smokeless tobacco: Never  Vaping Use   Vaping Use: Never used  Substance and Sexual Activity   Alcohol use: No   Drug use: No   Sexual activity: Not on file  Other Topics Concern   Not on file  Social History Narrative   Not on file   Social Determinants of Health   Financial Resource Strain: Not on file  Food Insecurity: Not on file  Transportation Needs: Not on file  Physical Activity: Not on file  Stress: Not on file  Social Connections: Not on file  Intimate Partner Violence: Not on file   Justin [name] is retired. His daughter is Mudlogger of nursing for a nursing home in [place], [place].    FAMILY HISTORY: Family History  Problem Relation Age of Onset   Stroke Mother    Hypertension Mother    Alzheimer's disease Father    Heart failure Father    Down syndrome Daughter     ALLERGIES:  is allergic to other and no known allergies.  MEDICATIONS:  Current Outpatient Medications  Medication Sig Dispense Refill   amoxicillin-clavulanate (AUGMENTIN) 875-125 MG tablet Take 1 tablet by mouth 2 (two) times daily for 10 days. 20 tablet 0   Ascorbic Acid (VITAMIN C) 1000 MG tablet 1 tablet     atorvastatin (LIPITOR) 10 MG tablet Take 10 mg by mouth daily. 1/2 tablet daily     Coenzyme Q10 (CO Q-10) 100 MG CAPS  See admin instructions.     cyanocobalamin 1000 MCG tablet Take 1,000 mcg by mouth daily.     Ferrous Sulfate (IRON) 90 (18 Fe) MG TABS Take by mouth.     FREESTYLE LITE test strip      glucose monitoring kit (FREESTYLE) monitoring kit 1 each by Does not apply route as needed for other.     Lancets (FREESTYLE) lancets 1 each daily.     lenalidomide (REVLIMID) 5 MG capsule TAKE 1 CAPSULE DAILY FOR 21 DAYS ON AND 7 DAYS OFF, REPEAT EVERY 28 DAYS 21 capsule 0   Magnesium 400 MG TABS 1 tablet with a meal  metFORMIN (GLUCOPHAGE) 1000 MG tablet Take 0.5 tablets (500 mg total) by mouth daily with breakfast. 90 tablet 3   nitroGLYCERIN (NITROSTAT) 0.4 MG SL tablet Place 1 tablet (0.4 mg total) under the tongue every 5 (five) minutes as needed for chest pain (Max 3 doses in 15 mins.). 30 tablet 0   omeprazole (PRILOSEC) 20 MG capsule Take 20 mg by mouth every morning.      Potassium 99 MG TABS 1 tablet     pregabalin (LYRICA) 75 MG capsule Take 75 mg by mouth 3 (three) times daily.     rivaroxaban (XARELTO) 20 MG TABS tablet TAKE 1 TABLET DAILY WITH SUPPER 90 tablet 1   valACYclovir (VALTREX) 1000 MG tablet Take 1 tablet (1,000 mg total) by mouth 2 (two) times daily for 5 days. 10 tablet 0   Vitamin D, Ergocalciferol, (DRISDOL) 1.25 MG (50000 UNIT) CAPS capsule TAKE 1 CAPSULE BY MOUTH 1 TIME A WEEK 12 capsule 3   No current facility-administered medications for this visit.    REVIEW OF SYSTEMS:   .10 Point review of Systems was done is negative except as noted above.   PHYSICAL EXAMINATION:   ECOG PERFORMANCE STATUS: 2 - Symptomatic, <50% confined to bed Vitals:   09/06/21 1039  BP: 135/70  Pulse: 65  Resp: 18  Temp: 98 F (36.7 C)  SpO2: 96%     Wt Readings from Last 3 Encounters:  09/06/21 193 lb 6.4 oz (87.7 kg)  07/12/21 193 lb 4.8 oz (87.7 kg)  05/03/21 196 lb 6.4 oz (89.1 kg)   Body mass index is 29.41 kg/m.  NAD . GENERAL:alert, in no acute distress and  comfortable SKIN: no acute rashes, no significant lesions EYES: conjunctiva are pink and non-injected, sclera anicteric OROPHARYNX: MMM, no exudates, no oropharyngeal erythema or ulceration NECK: supple, no JVD LYMPH:  no palpable lymphadenopathy in the cervical, axillary or inguinal regions LUNGS: clear to auscultation b/l with normal respiratory effort HEART: regular rate & rhythm ABDOMEN:  normoactive bowel sounds , non tender, not distended. Extremity: no pedal edema PSYCH: alert & oriented x 3 with fluent speech NEURO: no focal motor/sensory deficits    LABORATORY DATA:  I have reviewed the data as listed. CBC Latest Ref Rng & Units 08/29/2021 07/05/2021 06/08/2021  WBC 4.0 - 10.5 K/uL 2.5(L) 2.1(L) 2.9(L)  Hemoglobin 13.0 - 17.0 g/dL 13.1 12.4(L) 12.5(L)  Hematocrit 39.0 - 52.0 % 39.8 36.6(L) 38.0(L)  Platelets 150 - 400 K/uL 124(L) 107(L) 130(L)  ANC 1k  CMP Latest Ref Rng & Units 08/29/2021 07/05/2021 06/08/2021  Glucose 70 - 99 mg/dL 119(H) 125(H) 130(H)  BUN 8 - 23 mg/dL _0 Creatinine 0.61 - 1.24 mg/dL 1.08 1.16 1.29(H)  Sodium 135 - 145 mmol/L 138 140 139  Potassium 3.5 - 5.1 mmol/L 4.0 4.4 4.8  Chloride 98 - 111 mmol/L 108 106 105  CO2 22 - 32 mmol/L 21(L) 28 28  Calcium 8.9 - 10.3 mg/dL 9.0 8.9 9.0  Total Protein 6.5 - 8.1 g/dL 6.4(L) 6.1(L) 6.7  Total Bilirubin 0.3 - 1.2 mg/dL 0.7 0.7 0.9  Alkaline Phos 38 - 126 U/L 59 52 50  AST 15 - 41 U/L _1 ALT 0 - 44 U/L _2 RADIOGRAPHIC STUDIES: I have personally reviewed the radiological images as listed and agreed with the findings in the report.       Bone Marrow Biopsy 12/24/2016 (Accession [id number])  Diagnosis  Bone Marrow, Aspirate,Biopsy, and Clot, left iliac crest - HYPERCELLULAR BONE MARROW FOR AGE WITH PLASMA CELL NEOPLASM. - SEE COMMENT. PERIPHERAL BLOOD: - NORMOCYTIC-NORMOCHROMIC ANEMIA. - LEUKOPENIA.  DG Bone Survey Met (Accession [id number]) (Order [id number])  Imaging  Date:  11/28/2016 Department: [facility] Released By: Pricilla Riffle Authorizing: Brunetta Genera, MD  Exam Information   Status Exam Begun  Exam Ended   Final [99] 11/28/2016 11:59 AM 11/28/2016 12:36 PM  PACS Images   Show images for DG Bone Survey Met  Study Result   CLINICAL DATA:  Monoclonal gammopathy of unknown significance. History of squamous cell carcinoma excision, basal cell carcinoma excision.   EXAM: METASTATIC BONE SURVEY   COMPARISON:  CT neck, chest, abdomen and pelvis from 10/29/16, MRI of the head from 06/01/2016, CXR 10/27/2016, lumbar spine radiographs 06/24/2016   FINDINGS: Lateral skull: Small occipital lucency which may represent a normal arachnoid granulation posteriorly in the occiput. No definite lytic abnormality.   Cervical spine AP and lateral: Disc space narrowing at C2-3, and from C4 through C7. C4-5, C5-6 and C6-7 uncovertebral joint spurring bilaterally. No lytic abnormality.   Thoracic spine AP and lateral: T8-9 right-sided osteophytes and to a lesser degree T9-10. No lytic abnormality. Slight multilevel lumbar thoracic disc space narrowing likely degenerative.   Lumbar spine AP and lateral: Disc space narrowing at L4-5 and to a greater extent L5-S1. No lytic disease. L3 through S1 facet sclerosis.   AP pelvis: Negative for lytic disease.   Bilateral upper and lower extremities shoulders through wrist and from the hips through ankle: Negative for lytic disease. Joint space narrowing of the femorotibial compartments both knees. Subchondral cyst of the right patella.   CXR: Clear lungs. Cardiac implantable monitoring device projects over the left heart. Aortic atherosclerosis. No lytic disease.   IMPRESSION: No findings suspicious for lytic disease.     Electronically Signed   By: Ashley Royalty M.D.   On: 11/28/2016 14:58     ASSESSMENT & PLAN:   80 y.o. male with  1) IgG Lambda Multiple  Myeloma - RISS 1 BM Bx with 20% clonal plasma cell with Lambda light chain restriction. (12/2016) Peak M spike 2.6  Cytogenetics and Myeloma FISH panel.- trisomy 11 Patient has normocytic anemia without any other clear etiology. (>2g/dl lower that lower limit of normal which is 13) which per criteria would place this in the Multiple myeloma category as opposed to Smoldering Multiple myeloma (Borderline criterion)  No overt renal failure or hypercalcemia at this time No focal bone pains though he has significant chronic back pain related to degenerative disc disease. Bone survey shows no overt lytic lesions.  10/16/18 CT Coronary Morph revealed Coronary artery calcium score 299 Agatston units, this places the patient in the 47th percentile for age and gender, suggesting intermediate risk for future cardiac events. 2. Nonobstructive coronary disease.  12/08/18 Bone Density study revealed that the pt has osteoporosis  2) Anemia and thrombocytopenia -- related to treatment (Revlimid) -stable  3) Grade 2 Neuropathy -- in b/l feet and halfway up lower leg without pain -Seconadry to [employer] -improved off [place]  5) Right kidney cyst with nodularity -Reviewed the 01/05/19 US Abdomen which revealed Septated cyst arising from lower pole right kidney with a questionable solid focus within this lesion. This nodular area potentially could represent a small neoplasm developing within this complex cyst. Given this circumstance, pre and post contrast MR of the kidneys is advised to further evaluate. If there is  a contraindication to MR, pre and post-contrast CT would be a reasonable alternative to further assess. Study otherwise unremarkable. -Pt will likely need urology referral and MRI, pt would like to discuss this further with his PCP before triggering a referral at this point  6) Borderline low B12 levels, previously 299 --- on replacement -  -05/05/19 Vitamin B12 at 586 -continue replacement.  7) h/o  recurrent CVA - thought to be related to small vessel disease as per neurology. Has a small PFO which could be an additional risk factor as well as his afib  8)  P afib Plan -Continue on Xarelto per cardiology.  9) H/o recurrent SCC -Underwent surgical excision on right forearm with Dr. Romana Juniper on 07/28/18. His surgical pathology showed evidence of Squamous Cell Carcinoma. Plan -no evidence of recurrence locally at the site of surgery at this time. -continue dermatology followup  10) Lower extremity discomfort - improved, stable. Likely Discogenic pain -[place] on 12/2017 revealed no blood clot -Pt has established care with orthopedist and PT.  11) Patient Active Problem List   Diagnosis Date Noted   Osteoporosis 03/10/2019   Multiple myeloma without remission ([place]) 03/10/2019   Chest pain 09/27/2018   Multiple myeloma ([place]) 09/27/2018   Degeneration of lumbar intervertebral disc 01/30/2018   Low back pain 01/30/2018   Lumbar radiculopathy 01/30/2018   History of loop recorder 06/12/2017   Cryptogenic stroke ([place]) 10/28/2016   Gait abnormality    History of CVA (cerebrovascular accident)    History of TIA (transient ischemic attack)    Benign essential HTN    Paroxysmal SVT (supraventricular tachycardia) (HCC)    Acute blood loss anemia    Acute ischemic stroke ([place])    CVA (cerebral vascular accident) ([place]) 10/26/2016   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   History of recent stroke 06/06/2016   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 CBC unremarkable other than a WBC count of 2.5k and platelet count of 124k CMP stable Myeloma panel shows a stable M spike of 0.1 g/dL Serum kappa lambda free light chain ratio is within normal limits. -No lab or clinical evidence of Multiple Myeloma progression at this time. Will continue to monitor. -The pt has no prohibitive toxicities from continuing 5 mg Revlimid 3 weeks on, 1 week off at this time. -Continue Prolia q61month  FOLLOW UP: Evusheld today Please schedule for MD visit and next dose of Prolia in 10 weeks Labs in 9 weeks  . The total time spent in the appointment was 31 minutes and more than 50% was on counseling and direct patient cares.   All of the patient's questions were answered with apparent satisfaction. The patient knows to call the clinic with any problems, questions or concerns.   GSullivan LoneMD MDeerfieldAAHIVMS SGood Shepherd Penn Partners Specialty Hospital At RittenhouseCZachary - Amg Specialty HospitalHematology/Oncology Physician [facility]

## 2021-09-14 ENCOUNTER — Other Ambulatory Visit: Payer: Self-pay | Admitting: Hematology

## 2021-09-14 DIAGNOSIS — C9001 Multiple myeloma in remission: Secondary | ICD-10-CM

## 2021-09-17 DIAGNOSIS — L578 Other skin changes due to chronic exposure to nonionizing radiation: Secondary | ICD-10-CM | POA: Diagnosis not present

## 2021-09-17 DIAGNOSIS — L821 Other seborrheic keratosis: Secondary | ICD-10-CM | POA: Diagnosis not present

## 2021-09-17 DIAGNOSIS — L570 Actinic keratosis: Secondary | ICD-10-CM | POA: Diagnosis not present

## 2021-09-17 DIAGNOSIS — D485 Neoplasm of uncertain behavior of skin: Secondary | ICD-10-CM | POA: Diagnosis not present

## 2021-09-17 DIAGNOSIS — D225 Melanocytic nevi of trunk: Secondary | ICD-10-CM | POA: Diagnosis not present

## 2021-09-17 DIAGNOSIS — Z86018 Personal history of other benign neoplasm: Secondary | ICD-10-CM | POA: Diagnosis not present

## 2021-09-17 DIAGNOSIS — L720 Epidermal cyst: Secondary | ICD-10-CM | POA: Diagnosis not present

## 2021-09-17 DIAGNOSIS — L814 Other melanin hyperpigmentation: Secondary | ICD-10-CM | POA: Diagnosis not present

## 2021-09-17 DIAGNOSIS — Z85828 Personal history of other malignant neoplasm of skin: Secondary | ICD-10-CM | POA: Diagnosis not present

## 2021-09-18 ENCOUNTER — Other Ambulatory Visit: Payer: Self-pay

## 2021-10-01 DIAGNOSIS — D225 Melanocytic nevi of trunk: Secondary | ICD-10-CM | POA: Diagnosis not present

## 2021-10-01 DIAGNOSIS — D485 Neoplasm of uncertain behavior of skin: Secondary | ICD-10-CM | POA: Diagnosis not present

## 2021-10-15 ENCOUNTER — Other Ambulatory Visit: Payer: Self-pay

## 2021-10-15 DIAGNOSIS — C9001 Multiple myeloma in remission: Secondary | ICD-10-CM

## 2021-10-15 MED ORDER — LENALIDOMIDE 5 MG PO CAPS: ORAL_CAPSULE | ORAL | 0 refills | Status: DC

## 2021-11-08 ENCOUNTER — Other Ambulatory Visit: Payer: Medicare Other

## 2021-11-14 ENCOUNTER — Ambulatory Visit: Payer: Medicare Other | Admitting: Hematology

## 2021-11-14 ENCOUNTER — Ambulatory Visit: Payer: Medicare Other

## 2021-11-14 ENCOUNTER — Other Ambulatory Visit: Payer: Self-pay

## 2021-11-14 ENCOUNTER — Other Ambulatory Visit: Payer: Medicare Other

## 2021-11-14 DIAGNOSIS — C9001 Multiple myeloma in remission: Secondary | ICD-10-CM

## 2021-11-14 MED ORDER — LENALIDOMIDE 5 MG PO CAPS: ORAL_CAPSULE | ORAL | 0 refills | Status: DC

## 2021-11-15 ENCOUNTER — Encounter: Payer: Self-pay | Admitting: Hematology

## 2021-11-19 DIAGNOSIS — E1169 Type 2 diabetes mellitus with other specified complication: Secondary | ICD-10-CM | POA: Diagnosis not present

## 2021-11-19 DIAGNOSIS — I480 Paroxysmal atrial fibrillation: Secondary | ICD-10-CM | POA: Diagnosis not present

## 2021-11-19 DIAGNOSIS — I25119 Atherosclerotic heart disease of native coronary artery with unspecified angina pectoris: Secondary | ICD-10-CM | POA: Diagnosis not present

## 2021-11-19 DIAGNOSIS — I679 Cerebrovascular disease, unspecified: Secondary | ICD-10-CM | POA: Diagnosis not present

## 2021-11-19 DIAGNOSIS — C9000 Multiple myeloma not having achieved remission: Secondary | ICD-10-CM | POA: Diagnosis not present

## 2021-11-19 DIAGNOSIS — R059 Cough, unspecified: Secondary | ICD-10-CM | POA: Diagnosis not present

## 2021-11-21 ENCOUNTER — Other Ambulatory Visit: Payer: Self-pay

## 2021-11-21 DIAGNOSIS — C9000 Multiple myeloma not having achieved remission: Secondary | ICD-10-CM

## 2021-11-22 ENCOUNTER — Inpatient Hospital Stay: Payer: Medicare Other | Attending: Hematology

## 2021-11-22 ENCOUNTER — Other Ambulatory Visit: Payer: Self-pay

## 2021-11-22 DIAGNOSIS — C9000 Multiple myeloma not having achieved remission: Secondary | ICD-10-CM | POA: Insufficient documentation

## 2021-11-22 LAB — CBC WITH DIFFERENTIAL (CANCER CENTER ONLY)
Abs Immature Granulocytes: 0.01 10*3/uL (ref 0.00–0.07)
Basophils Absolute: 0.1 10*3/uL (ref 0.0–0.1)
Basophils Relative: 2 %
Eosinophils Absolute: 0.2 10*3/uL (ref 0.0–0.5)
Eosinophils Relative: 7 %
HCT: 35.1 % — ABNORMAL LOW (ref 39.0–52.0)
Hemoglobin: 11.5 g/dL — ABNORMAL LOW (ref 13.0–17.0)
Immature Granulocytes: 0 %
Lymphocytes Relative: 22 %
Lymphs Abs: 0.8 10*3/uL (ref 0.7–4.0)
MCH: 29.2 pg (ref 26.0–34.0)
MCHC: 32.8 g/dL (ref 30.0–36.0)
MCV: 89.1 fL (ref 80.0–100.0)
Monocytes Absolute: 0.4 10*3/uL (ref 0.1–1.0)
Monocytes Relative: 12 %
Neutro Abs: 2 10*3/uL (ref 1.7–7.7)
Neutrophils Relative %: 57 %
Platelet Count: 163 10*3/uL (ref 150–400)
RBC: 3.94 MIL/uL — ABNORMAL LOW (ref 4.22–5.81)
RDW: 14.2 % (ref 11.5–15.5)
WBC Count: 3.5 10*3/uL — ABNORMAL LOW (ref 4.0–10.5)
nRBC: 0 % (ref 0.0–0.2)

## 2021-11-22 LAB — CMP (CANCER CENTER ONLY)
ALT: 37 U/L (ref 0–44)
AST: 31 U/L (ref 15–41)
Albumin: 2.9 g/dL — ABNORMAL LOW (ref 3.5–5.0)
Alkaline Phosphatase: 163 U/L — ABNORMAL HIGH (ref 38–126)
Anion gap: 10 (ref 5–15)
BUN: 18 mg/dL (ref 8–23)
CO2: 21 mmol/L — ABNORMAL LOW (ref 22–32)
Calcium: 8.3 mg/dL — ABNORMAL LOW (ref 8.9–10.3)
Chloride: 109 mmol/L (ref 98–111)
Creatinine: 1.16 mg/dL (ref 0.61–1.24)
GFR, Estimated: >60 mL/min (ref >60)
Glucose, Bld: 138 mg/dL — ABNORMAL HIGH (ref 70–99)
Potassium: 3.5 mmol/L (ref 3.5–5.1)
Sodium: 140 mmol/L (ref 135–145)
Total Bilirubin: 0.7 mg/dL (ref 0.3–1.2)
Total Protein: 6.6 g/dL (ref 6.5–8.1)

## 2021-11-23 LAB — KAPPA/LAMBDA LIGHT CHAINS
Kappa free light chain: 44.4 mg/L — ABNORMAL HIGH (ref 3.3–19.4)
Kappa, lambda light chain ratio: 1.44 (ref 0.26–1.65)
Lambda free light chains: 30.9 mg/L — ABNORMAL HIGH (ref 5.7–26.3)

## 2021-11-27 LAB — MULTIPLE MYELOMA PANEL, SERUM
Albumin SerPl Elph-Mcnc: 2.9 g/dL (ref 2.9–4.4)
Albumin/Glob SerPl: 1 (ref 0.7–1.7)
Alpha 1: 0.3 g/dL (ref 0.0–0.4)
Alpha2 Glob SerPl Elph-Mcnc: 1.1 g/dL — ABNORMAL HIGH (ref 0.4–1.0)
B-Globulin SerPl Elph-Mcnc: 0.9 g/dL (ref 0.7–1.3)
Gamma Glob SerPl Elph-Mcnc: 0.8 g/dL (ref 0.4–1.8)
Globulin, Total: 3.1 g/dL (ref 2.2–3.9)
IgA: 152 mg/dL (ref 61–437)
IgG (Immunoglobin G), Serum: 895 mg/dL (ref 603–1613)
IgM (Immunoglobulin M), Srm: 35 mg/dL (ref 15–143)
Total Protein ELP: 6 g/dL (ref 6.0–8.5)

## 2021-11-29 ENCOUNTER — Inpatient Hospital Stay: Payer: Medicare Other

## 2021-11-29 ENCOUNTER — Inpatient Hospital Stay (HOSPITAL_BASED_OUTPATIENT_CLINIC_OR_DEPARTMENT_OTHER): Payer: Medicare Other | Admitting: Hematology

## 2021-11-29 ENCOUNTER — Other Ambulatory Visit: Payer: Self-pay

## 2021-11-29 VITALS — BP 116/67 | HR 58 | Temp 97.3°F | Resp 18 | Wt 186.9 lb

## 2021-11-29 DIAGNOSIS — C9000 Multiple myeloma not having achieved remission: Secondary | ICD-10-CM

## 2021-11-29 DIAGNOSIS — M818 Other osteoporosis without current pathological fracture: Secondary | ICD-10-CM

## 2021-11-29 MED ORDER — DENOSUMAB 60 MG/ML ~~LOC~~ SOSY
60.0000 mg | PREFILLED_SYRINGE | Freq: Once | SUBCUTANEOUS | Status: AC
  Administered 2021-11-29: 60 mg via SUBCUTANEOUS
  Filled 2021-11-29: qty 1

## 2021-11-29 NOTE — Patient Instructions (Signed)
Denosumab injection What is this medication? DENOSUMAB (den oh sue mab) slows bone breakdown. Prolia is used to treat osteoporosis in women after menopause and in men, and in people who are taking corticosteroids for 6 months or more. Xgeva is used to treat a high calcium level due to cancer and to prevent bone fractures and other bone problems caused by multiple myeloma or cancer bone metastases. Xgeva is also used to treat giant cell tumor of the bone. This medicine may be used for other purposes; ask your health care provider or pharmacist if you have questions. COMMON BRAND NAME(S): Prolia, XGEVA What should I tell my care team before I take this medication? They need to know if you have any of these conditions: dental disease having surgery or tooth extraction infection kidney disease low levels of calcium or Vitamin D in the blood malnutrition on hemodialysis skin conditions or sensitivity thyroid or parathyroid disease an unusual reaction to denosumab, other medicines, foods, dyes, or preservatives pregnant or trying to get pregnant breast-feeding How should I use this medication? This medicine is for injection under the skin. It is given by a health care professional in a hospital or clinic setting. A special MedGuide will be given to you before each treatment. Be sure to read this information carefully each time. For Prolia, talk to your pediatrician regarding the use of this medicine in children. Special care may be needed. For Xgeva, talk to your pediatrician regarding the use of this medicine in children. While this drug may be prescribed for children as young as 13 years for selected conditions, precautions do apply. Overdosage: If you think you have taken too much of this medicine contact a poison control center or emergency room at once. NOTE: This medicine is only for you. Do not share this medicine with others. What if I miss a dose? It is important not to miss your dose.  Call your doctor or health care professional if you are unable to keep an appointment. What may interact with this medication? Do not take this medicine with any of the following medications: other medicines containing denosumab This medicine may also interact with the following medications: medicines that lower your chance of fighting infection steroid medicines like prednisone or cortisone This list may not describe all possible interactions. Give your health care provider a list of all the medicines, herbs, non-prescription drugs, or dietary supplements you use. Also tell them if you smoke, drink alcohol, or use illegal drugs. Some items may interact with your medicine. What should I watch for while using this medication? Visit your doctor or health care professional for regular checks on your progress. Your doctor or health care professional may order blood tests and other tests to see how you are doing. Call your doctor or health care professional for advice if you get a fever, chills or sore throat, or other symptoms of a cold or flu. Do not treat yourself. This drug may decrease your body's ability to fight infection. Try to avoid being around people who are sick. You should make sure you get enough calcium and vitamin D while you are taking this medicine, unless your doctor tells you not to. Discuss the foods you eat and the vitamins you take with your health care professional. See your dentist regularly. Brush and floss your teeth as directed. Before you have any dental work done, tell your dentist you are receiving this medicine. Do not become pregnant while taking this medicine or for 5 months after   stopping it. Talk with your doctor or health care professional about your birth control options while taking this medicine. Women should inform their doctor if they wish to become pregnant or think they might be pregnant. There is a potential for serious side effects to an unborn child. Talk to  your health care professional or pharmacist for more information. What side effects may I notice from receiving this medication? Side effects that you should report to your doctor or health care professional as soon as possible: allergic reactions like skin rash, itching or hives, swelling of the face, lips, or tongue bone pain breathing problems dizziness jaw pain, especially after dental work redness, blistering, peeling of the skin signs and symptoms of infection like fever or chills; cough; sore throat; pain or trouble passing urine signs of low calcium like fast heartbeat, muscle cramps or muscle pain; pain, tingling, numbness in the hands or feet; seizures unusual bleeding or bruising unusually weak or tired Side effects that usually do not require medical attention (report to your doctor or health care professional if they continue or are bothersome): constipation diarrhea headache joint pain loss of appetite muscle pain runny nose tiredness upset stomach This list may not describe all possible side effects. Call your doctor for medical advice about side effects. You may report side effects to FDA at 1-800-FDA-1088. Where should I keep my medication? This medicine is only given in a clinic, doctor's office, or other health care setting and will not be stored at home. NOTE: This sheet is a summary. It may not cover all possible information. If you have questions about this medicine, talk to your doctor, pharmacist, or health care provider.  2022 Elsevier/Gold Standard (2018-04-17 00:00:00)

## 2021-12-05 ENCOUNTER — Encounter: Payer: Self-pay | Admitting: Hematology

## 2021-12-05 NOTE — Progress Notes (Addendum)
HEMATOLOGY/ONCOLOGY CLINIC NOTE  Date of Service: .11/29/2021   Patient Care Team: Mayra Neer, MD as PCP - General (Family Medicine) Debara Pickett Nadean Corwin, MD as PCP - Cardiology (Cardiology)   CHIEF COMPLAINTS:  Follow-up for continued management of multiple myeloma   HISTORY OF PRESENTING ILLNESS:  Please see previous note for details of initial presentation  CURRENT THERAPY:   Revlimid maintenance 5 mg po 3 weeks on 1 week off.   INTERVAL HISTORY:  Justin [name] was seen today for continued evaluation and management of multiple myeloma.  His last clinic visit with [place] was on 09/06/2021. He notes he had a good vacation in [place] with his family. He has been feeling well overall and has no acute new symptoms.  Continues to have some chronic back pain and radiculopathy symptoms that are unchanged. Lab results from 11/22/2021 were reviewed with the patient in details. Patient notes grade 1 cramps but no other significant toxicities from his current dose of maintenance Revlimid.   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 s/p RFCA on 05/01/15   Stroke ([place]) 2017   TIA and mild stroke; denies stroke symptoms since then     SURGICAL HISTORY: Past Surgical History:  Procedure Laterality Date   ATRIAL FIBRILLATION ABLATION     Dr. Lovena Le   BASAL CELL CARCINOMA EXCISION     off of back   ELECTROPHYSIOLOGIC STUDY N/A 05/01/2015   Procedure: SVT Ablation;  Surgeon: Evans Lance, MD;  Location: [facility];  Service: Cardiovascular;  Laterality: N/A;   EP IMPLANTABLE DEVICE N/A 06/04/2016    Procedure: Loop Recorder Insertion;  Surgeon: Thompson Grayer, MD;  Location: [facility];  Service: Cardiovascular;  Laterality: N/A;   INGUINAL HERNIA REPAIR Bilateral 06/16/2017   Procedure: LAPAROSCOPIC BILATERAL INGUINAL HERNIA REPAIR;  Surgeon: Clovis Riley, MD;  Location: [facility];  Service: General;  Laterality: Bilateral;   INSERTION OF MESH Bilateral 06/16/2017   Procedure: INSERTION OF MESH;  Surgeon: Clovis Riley, MD;  Location: [facility];  Service: General;  Laterality: Bilateral;   MASS EXCISION Right 07/28/2018   Procedure: EXCISION OF RIGHT FOREARM MASS;  Surgeon: Clovis Riley, MD;  Location: [facility];  Service: General;  Laterality: Right;   SQUAMOUS CELL CARCINOMA EXCISION     TEE WITHOUT CARDIOVERSION N/A 06/04/2016   Procedure: TRANSESOPHAGEAL ECHOCARDIOGRAM (TEE);  Surgeon: Pixie Casino, MD;  Location: [facility];  Service: Cardiovascular;  Laterality: N/A;    SOCIAL HISTORY: Social History   Socioeconomic History   Marital status: Married    Spouse name: Not on file   Number of children: Not on file   Years of education: Not on file   Highest education level: Not on file  Occupational History   Not on file  Tobacco Use   Smoking status: Never   Smokeless tobacco: Never  Vaping Use   Vaping Use: Never used  Substance and Sexual Activity   Alcohol use: No   Drug use: No   Sexual  activity: Not on file  Other Topics Concern   Not on file  Social History Narrative   Not on file   Social Determinants of Health   Financial Resource Strain: Not on file  Food Insecurity: Not on file  Transportation Needs: Not on file  Physical Activity: Not on file  Stress: Not on file  Social Connections: Not on file  Intimate Partner Violence: Not on file   Justin [name] is retired. His daughter is Mudlogger of nursing for a nursing home in [place], [place].    FAMILY HISTORY: Family History  Problem Relation Age of Onset   Stroke Mother    Hypertension Mother     Alzheimer's disease Father    Heart failure Father    Down syndrome Daughter     ALLERGIES:  is allergic to other and no known allergies.  MEDICATIONS:  Current Outpatient Medications  Medication Sig Dispense Refill   Ascorbic Acid (VITAMIN C) 1000 MG tablet 1 tablet     atorvastatin (LIPITOR) 10 MG tablet Take 10 mg by mouth daily. 1/2 tablet daily     Coenzyme Q10 (CO Q-10) 100 MG CAPS See admin instructions.     cyanocobalamin 1000 MCG tablet Take 1,000 mcg by mouth daily.     Ferrous Sulfate (IRON) 90 (18 Fe) MG TABS Take by mouth.     FREESTYLE LITE test strip      glucose monitoring kit (FREESTYLE) monitoring kit 1 each by Does not apply route as needed for other.     Lancets (FREESTYLE) lancets 1 each daily.     lenalidomide (REVLIMID) 5 MG capsule TAKE 1 CAPSULE DAILY FOR 21 DAYS ON AND 7 DAYS OFF, REPEAT EVERY 28 DAYS 21 capsule 0   Magnesium 400 MG TABS 1 tablet with a meal     metFORMIN (GLUCOPHAGE) 1000 MG tablet Take 0.5 tablets (500 mg total) by mouth daily with breakfast. 90 tablet 3   nitroGLYCERIN (NITROSTAT) 0.4 MG SL tablet Place 1 tablet (0.4 mg total) under the tongue every 5 (five) minutes as needed for chest pain (Max 3 doses in 15 mins.). 30 tablet 0   omeprazole (PRILOSEC) 20 MG capsule Take 20 mg by mouth every morning.      Potassium 99 MG TABS 1 tablet     pregabalin (LYRICA) 75 MG capsule Take 75 mg by mouth 3 (three) times daily.     rivaroxaban (XARELTO) 20 MG TABS tablet TAKE 1 TABLET DAILY WITH SUPPER 90 tablet 1   Vitamin D, Ergocalciferol, (DRISDOL) 1.25 MG (50000 UNIT) CAPS capsule TAKE 1 CAPSULE BY MOUTH 1 TIME A WEEK 12 capsule 3   No current facility-administered medications for this visit.    REVIEW OF SYSTEMS:   .10 Point review of Systems was done is negative except as noted above.  PHYSICAL EXAMINATION:   ECOG PERFORMANCE STATUS: 2 - Symptomatic, <50% confined to bed Vitals:   11/29/21 1056  BP: 116/67  Pulse: (!) 58  Resp: 18   Temp: (!) 97.3 F (36.3 C)  SpO2: 99%     Wt Readings from Last 3 Encounters:  11/29/21 186 lb 14.4 oz (84.8 kg)  09/06/21 193 lb 6.4 oz (87.7 kg)  07/12/21 193 lb 4.8 oz (87.7 kg)   Body mass index is 28.42 kg/m.  NAD  GENERAL:alert, in no acute distress and comfortable SKIN: no acute rashes, no significant lesions EYES: conjunctiva are pink and non-injected, sclera anicteric OROPHARYNX: MMM, no exudates, no oropharyngeal erythema or ulceration NECK:  supple, no JVD LYMPH:  no palpable lymphadenopathy in the cervical, axillary or inguinal regions LUNGS: clear to auscultation b/l with normal respiratory effort HEART: regular rate & rhythm ABDOMEN:  normoactive bowel sounds , non tender, not distended. Extremity: no pedal edema PSYCH: alert & oriented x 3 with fluent speech NEURO: no focal motor/sensory deficits     LABORATORY DATA:  I have reviewed the data as listed. CBC Latest Ref Rng & Units 11/22/2021 08/29/2021 07/05/2021  WBC 4.0 - 10.5 K/uL 3.5(L) 2.5(L) 2.1(L)  Hemoglobin 13.0 - 17.0 g/dL 11.5(L) 13.1 12.4(L)  Hematocrit 39.0 - 52.0 % 35.1(L) 39.8 36.6(L)  Platelets 150 - 400 K/uL 163 124(L) 107(L)  ANC 1k  CMP Latest Ref Rng & Units 11/22/2021 08/29/2021 07/05/2021  Glucose 70 - 99 mg/dL 138(H) 119(H) 125(H)  BUN 8 - 23 mg/dL 18 13 17   Creatinine 0.61 - 1.24 mg/dL 1.16 1.08 1.16  Sodium 135 - 145 mmol/L 140 138 140  Potassium 3.5 - 5.1 mmol/L 3.5 4.0 4.4  Chloride 98 - 111 mmol/L 109 108 106  CO2 22 - 32 mmol/L 21(L) 21(L) 28  Calcium 8.9 - 10.3 mg/dL 8.3(L) 9.0 8.9  Total Protein 6.5 - 8.1 g/dL 6.6 6.4(L) 6.1(L)  Total Bilirubin 0.3 - 1.2 mg/dL 0.7 0.7 0.7  Alkaline Phos 38 - 126 U/L 163(H) 59 52  AST 15 - 41 U/L 31 19 20   ALT 0 - 44 U/L 37 9 9        RADIOGRAPHIC STUDIES: I have personally reviewed the radiological images as listed and agreed with the findings in the report.       Bone Marrow Biopsy 12/24/2016 (Accession [id number])  Diagnosis Bone  Marrow, Aspirate,Biopsy, and Clot, left iliac crest - HYPERCELLULAR BONE MARROW FOR AGE WITH PLASMA CELL NEOPLASM. - SEE COMMENT. PERIPHERAL BLOOD: - NORMOCYTIC-NORMOCHROMIC ANEMIA. - LEUKOPENIA.  DG Bone Survey Met (Accession [id number]) (Order [id number])  Imaging  Date: 11/28/2016 Department: [facility]-RADIOLOGY[facility]STIC Released By: Pricilla Riffle Authorizing: Brunetta Genera, MD  Exam Information   Status Exam Begun  Exam Ended   Final [99] 11/28/2016 11:59 AM 11/28/2016 12:36 PM  PACS Images   Show images for DG Bone Survey Met  Study Result   CLINICAL DATA:  Monoclonal gammopathy of unknown significance. History of squamous cell carcinoma excision, basal cell carcinoma excision.   EXAM: METASTATIC BONE SURVEY   COMPARISON:  CT neck, chest, abdomen and pelvis from 10/29/16, MRI of the head from 06/01/2016, CXR 10/27/2016, lumbar spine radiographs 06/24/2016   FINDINGS: Lateral skull: Small occipital lucency which may represent a normal arachnoid granulation posteriorly in the occiput. No definite lytic abnormality.   Cervical spine AP and lateral: Disc space narrowing at C2-3, and from C4 through C7. C4-5, C5-6 and C6-7 uncovertebral joint spurring bilaterally. No lytic abnormality.   Thoracic spine AP and lateral: T8-9 right-sided osteophytes and to a lesser degree T9-10. No lytic abnormality. Slight multilevel lumbar thoracic disc space narrowing likely degenerative.   Lumbar spine AP and lateral: Disc space narrowing at L4-5 and to a greater extent L5-S1. No lytic disease. L3 through S1 facet sclerosis.   AP pelvis: Negative for lytic disease.   Bilateral upper and lower extremities shoulders through wrist and from the hips through ankle: Negative for lytic disease. Joint space narrowing of the femorotibial compartments both knees. Subchondral cyst of the right patella.   CXR: Clear lungs. Cardiac implantable monitoring device  projects over the left heart. Aortic atherosclerosis. No lytic  disease.   IMPRESSION: No findings suspicious for lytic disease.     Electronically Signed   By: Ashley Royalty M.D.   On: 11/28/2016 14:58     ASSESSMENT & PLAN:   80 y.o. 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 (12/2016) Peak M spike 2.6  Cytogenetics and Myeloma FISH panel.- trisomy 11 Patient has normocytic anemia without any other clear etiology. (>2g/dl lower that lower limit of normal which is 13) which per criteria would place this in the Multiple myeloma category as opposed to Smoldering Multiple myeloma (Borderline criterion)  No overt renal failure or hypercalcemia at this time No focal bone pains though he has significant chronic back pain related to degenerative disc disease. Bone survey shows no overt lytic lesions.  10/16/18 CT Coronary Morph revealed Coronary artery calcium score 299 Agatston units, this places the patient in the 47th percentile for age and gender, suggesting intermediate risk for future cardiac events. 2. Nonobstructive coronary disease.  12/08/18 Bone Density study revealed that the pt has osteoporosis  2) Anemia and thrombocytopenia -- related to treatment (Revlimid) -stable  3) Grade 2 Neuropathy -- in b/l feet and halfway up lower leg without pain -Secon[employer] to [employer] -improved off [place]  5) Right kidney cyst with nodularity -Reviewed the 01/05/19 US Abdomen which revealed Septated cyst arising from lower pole right kidney with a questionable solid focus within this lesion. This nodular area potentially could represent a small neoplasm developing within this complex cyst. Given this circumstance, pre and post contrast MR of the kidneys is advised to further evaluate. If there is a contraindication to MR, pre and post-contrast CT would be a reasonable alternative to further assess. Study otherwise  unremarkable. -Pt will likely need urology referral and MRI, pt would like to discuss this further with his PCP before triggering a referral at this point  6) Borderline low B12 levels, previously 299 --- on replacement -  -05/05/19 Vitamin B12 at 586 -continue replacement.  7) h/o recurrent CVA - thought to be related to small vessel disease as per neurology. Has a small PFO which could be an additional risk factor as well as his afib  8)  P afib Plan -Continue on Xarelto per cardiology.  9) H/o recurrent SCC -Underwent surgical excision on right forearm with Dr. Romana Juniper on 07/28/18. His surgical pathology showed evidence of Squamous Cell Carcinoma. Plan -no evidence of recurrence locally at the site of surgery at this time. -continue dermatology followup  10) Lower extremity discomfort - improved, stable. Likely Discogenic pain -[place] on 12/2017 revealed no blood clot -Pt has established care with orthopedist and PT.  11) Patient Active Problem List   Diagnosis Date Noted   Osteoporosis 03/10/2019   Multiple myeloma without remission ([place]) 03/10/2019   Chest pain 09/27/2018   Multiple myeloma ([place]) 09/27/2018   Degeneration of lumbar intervertebral disc 01/30/2018   Low back pain 01/30/2018   Lumbar radiculopathy 01/30/2018   History of loop recorder 06/12/2017   Cryptogenic stroke ([place]) 10/28/2016   Gait abnormality    History of CVA (cerebrovascular accident)    History of TIA (transient ischemic attack)    Benign essential HTN    Paroxysmal SVT (supraventricular tachycardia) (HCC)    Acute blood loss anemia    Acute ischemic stroke ([place])    CVA (cerebral vascular accident) ([place]) 10/26/2016   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   History of  recent stroke 06/06/2016   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   TIA (transient ischemic attack) 06/02/2016   Numbness 06/01/2016   Right arm numbness 06/01/2016   Atherosclerosis of native coronary artery of native heart without  angina pectoris 03/20/2016   Hypertension    GERD (gastroesophageal reflux disease)    Gout    S/P RF ablation operation for arrhythmia 12/13/2014   Hyperlipidemia 12/13/2014   Controlled type 2 diabetes mellitus with complication, without long-term current use of insulin ([place]) 12/07/2014    PLAN: -Discussed pt labwork, 11/22/2021 CBC with mild anemia of 11.5, WBC count 3.5k, platelets 163k CMP stable, mild hypocalcemia 8.3.  Alkaline phosphatase 163. M spike not observed IFE positive for IgG lambda monoclonal protein Serum kappa lambda free light chain ratio is within normal limits. -Patient has no clinical or lab findings suggestive of multiple myeloma progression at this time. -No notable toxicities from maintenance Revlimid. -Continue maintenance Revlimid 5 mg 3 weeks on 1 week off at this time. -Continue Prolia 60 mg every 6 months for osteoporosis  FOLLOW UP: Return to clinic with Dr. Irene Limbo in 10 weeks Labs in 9 weeks   All of the patient's questions were answered with apparent satisfaction. The patient knows to call the clinic with any problems, questions or concerns.   Sullivan Lone MD MS AAHIVMS [facility] Hematology/Oncology Physician [place]  Return to clinic with Dr. Irene Limbo in 10 weeks Labs in 9 weeks

## 2021-12-07 ENCOUNTER — Ambulatory Visit: Payer: Medicare Other

## 2021-12-21 ENCOUNTER — Other Ambulatory Visit: Payer: Self-pay

## 2021-12-21 DIAGNOSIS — C9001 Multiple myeloma in remission: Secondary | ICD-10-CM

## 2021-12-21 MED ORDER — LENALIDOMIDE 5 MG PO CAPS: ORAL_CAPSULE | ORAL | 0 refills | Status: DC

## 2021-12-28 DIAGNOSIS — Z0000 Encounter for general adult medical examination without abnormal findings: Secondary | ICD-10-CM | POA: Diagnosis not present

## 2021-12-28 DIAGNOSIS — D61818 Other pancytopenia: Secondary | ICD-10-CM | POA: Diagnosis not present

## 2021-12-28 DIAGNOSIS — E7800 Pure hypercholesterolemia, unspecified: Secondary | ICD-10-CM | POA: Diagnosis not present

## 2021-12-28 DIAGNOSIS — G629 Polyneuropathy, unspecified: Secondary | ICD-10-CM | POA: Diagnosis not present

## 2021-12-28 DIAGNOSIS — K219 Gastro-esophageal reflux disease without esophagitis: Secondary | ICD-10-CM | POA: Diagnosis not present

## 2021-12-28 DIAGNOSIS — E1169 Type 2 diabetes mellitus with other specified complication: Secondary | ICD-10-CM | POA: Diagnosis not present

## 2021-12-28 DIAGNOSIS — I679 Cerebrovascular disease, unspecified: Secondary | ICD-10-CM | POA: Diagnosis not present

## 2021-12-28 DIAGNOSIS — C9000 Multiple myeloma not having achieved remission: Secondary | ICD-10-CM | POA: Diagnosis not present

## 2021-12-28 DIAGNOSIS — I4891 Unspecified atrial fibrillation: Secondary | ICD-10-CM | POA: Diagnosis not present

## 2021-12-28 DIAGNOSIS — I25119 Atherosclerotic heart disease of native coronary artery with unspecified angina pectoris: Secondary | ICD-10-CM | POA: Diagnosis not present

## 2021-12-28 DIAGNOSIS — I10 Essential (primary) hypertension: Secondary | ICD-10-CM | POA: Diagnosis not present

## 2021-12-28 DIAGNOSIS — D6869 Other thrombophilia: Secondary | ICD-10-CM | POA: Diagnosis not present

## 2022-01-01 ENCOUNTER — Encounter: Payer: Self-pay | Admitting: Hematology

## 2022-01-08 ENCOUNTER — Ambulatory Visit (INDEPENDENT_AMBULATORY_CARE_PROVIDER_SITE_OTHER): Payer: Medicare Other | Admitting: Internal Medicine

## 2022-01-08 ENCOUNTER — Other Ambulatory Visit: Payer: Self-pay

## 2022-01-08 ENCOUNTER — Encounter (HOSPITAL_BASED_OUTPATIENT_CLINIC_OR_DEPARTMENT_OTHER): Payer: Self-pay

## 2022-01-08 ENCOUNTER — Encounter (HOSPITAL_BASED_OUTPATIENT_CLINIC_OR_DEPARTMENT_OTHER): Payer: Self-pay | Admitting: Internal Medicine

## 2022-01-08 VITALS — BP 123/65 | HR 53 | Ht 68.0 in | Wt 190.3 lb

## 2022-01-08 DIAGNOSIS — E782 Mixed hyperlipidemia: Secondary | ICD-10-CM

## 2022-01-08 DIAGNOSIS — I480 Paroxysmal atrial fibrillation: Secondary | ICD-10-CM

## 2022-01-08 DIAGNOSIS — Z8673 Personal history of transient ischemic attack (TIA), and cerebral infarction without residual deficits: Secondary | ICD-10-CM

## 2022-01-08 DIAGNOSIS — I10 Essential (primary) hypertension: Secondary | ICD-10-CM | POA: Diagnosis not present

## 2022-01-08 NOTE — Progress Notes (Signed)
OFFICE NOTE  Chief Complaint:  Follow-up  Primary Care Physician: Mayra Neer, MD  HPI:  Justin [name] is a pleasant 81 year old male who I met recently in the hospital. He presented with chest pain that he developed while sitting his computer. The symptoms had some typical and atypical features for angina. He ruled out for MI and underwent nuclear stress testing which was negative for ischemia. During the stress test while exercising he developed an SVT with heart rate that shot up abruptly to 220. This responded to vagal maneuvers and then reoccurred and eventually subsided. He was started on low-dose beta blocker and eventually discharged without any recurrent symptoms. He was placed on a monitor and has follow-up with cardiac electrophysiology to see Dr. Lovena Le in January. 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 He was started on Lipitor which was started the hospital for for coronary disease, however his LDL is only 95. He is a diabetic. His metformin was increased to 500 twice a day which is improved his blood sugars. He recently saw his primary care provider felt that he was doing well.  I saw Justin [name] back in the office today. He was referred to Dr. Cristopher Peru for SVT ablation consideration, and this was offered to him. Prior to having this scheduled he developed an episode of chest pain again and that brought him to the hospital yesterday. He was evaluated by Dr. Dorris Carnes, and had a right upper quadrant ultrasound which was unremarkable. He also had coronary CT angiogram which demonstrated mild to moderate coronary disease and an intermediate coronary calcium score of 202. He has subsequently worn a monitor between 12/21/2015 and 01/18/2015 which showed no recurrent SVT and normal sinus rhythm.  The etiology of his pain episodes is unclear however may be related to  reflux.   Justin [name] returns today for follow-up. He reports he is done very well without any recurrent SVT status post catheter ablation by Dr. Cristopher Peru. He is seeing me primarily for risk factor modification and treatment of asymptomatic coronary artery disease which was seen by coronary artery CT angiography. He was found to have 2 areas of mild coronary artery disease and a calcium score which was moderately elevated at 202. Since then he's been on lipid therapy with Lipitor and takes daily aspirin as well as my card is HCTZ for hypertension. Blood pressure is well-controlled today 106/60. He is due for repeat lipid profile and will need a refill on his cholesterol medication. He denies any chest pain or shortness of breath. 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  06/06/2016  Justin [name] returns today for follow-up. He was seen by myself in the hospital after he presented with symptoms of possible TIA. This is following recent hospitalization at [facility] in [place] where he had a stroke. MRI confirmed this although he has had no significant lasting deficit. His initial symptoms were left arm heaviness and numbness. He also subsequently had some right arm tingling but the symptoms were different than his original presentation. This was thought to be TIA. He was evaluated by neurology however repeat imaging failed to show any new deficits. Subsequently he was referred for a TEE which I performed. This demonstrated a small bidirectional PFO. He had a bifid left atrial appendage without  any thrombus. While he did have PFO, it's not clear that this was the etiology of his stroke or TIA. Venous Dopplers of the legs were negative for thrombus. He was changed from aspirin to Plavix. We also recommended and implanted loop recorder which was placed by Dr. Rayann Heman. The thought is that he may be having intermittent atrial fibrillation, as he has a history of  SVT underwent ablation.  06/12/2017  Justin [name] returns today for follow-up. He's had a very eventful year. 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 He's had problems with recurrent skin cancer. He has had no further stroke or TIA and has done well on aspirin and Plavix. Recently his neurologist he discontinued Plavix and is currently only on full dose aspirin. He did have an implanted loop recorder and multiple interrogations have failed to show any recurrent or intermittent atrial fibrillation. He denies any recurrent SVT. This is not been noted as well. Blood pressure is well controlled and he said recent weight loss which is appropriate at this point.  10/01/2018  Justin [name] is seen today in follow-up.  Is been over a year since I saw him.  Unfortunately was recently admitted with precordial chest pain last week.  He ruled out for MI and an outpatient echocardiogram was recommended.  Demonstrated LVEF 55 to 60%, mild LVH and grade 1 diastolic dysfunction.  No regional wall motion abnormalities.  Then he reports no recurrent chest pain.  He continues however to have some physical decline.  He was noted to be using a [name] today which she did not last year.  Partially may be related to stroke, however previously he was flying airplanes 2 years ago.  Denies recurrent palpitations.  Loop recorder interrogation is failed to show recurrent arrhythmia.  11/04/2018  Justin [name] returns today for follow-up.  He underwent CT coronary angiography which showed no obstructive coronary disease and a mild increase in coronary calcium to 229 compared to study in 2016.  I do suspect his chest pain is noncardiac.  He actually now saying that he is having more belching and gas and discomfort after eating which sounds GI in nature.  He does not have a current gastroenterologist.  12/30/2019  Justin [name] returns today for annual follow-up.  Earlier this morning he had a virtual visit with Dr. Rayann Heman.   This was for follow-up of his loop recorder which is now at end of battery life.  Ultimately he will want it explanted.  He did not show any further events.  He had brief episodes of A. fib and has been anticoagulated for that but has not had recent recurrence.  Nonetheless since we will be monitoring him forward, I would recommend he remain on lifelong anticoagulation with Xarelto.  Overall he says he is doing much better.  He says he is in remission with regards to his multiple myeloma.  Cholesterols been well controlled with recent lipid profile showing total cholesterol 126, triglycerides 154, HDL 66 and LDL of 63.  He is interested in possibly getting back to flying and might consider the basic med program.  01/08/2022  Justin [name] returns today for follow-up.  Overall he seems to be doing well but has a number of questions today.  He stopped taking a number of vitamins recently.  He had some concerns about his implanted loop recorder which has not been removed.  At this point the battery life is expired.  He said it was only noted that he had 1  episode of A. fib which was short in duration but therefore was recommended to have lifelong anticoagulation on Xarelto.  He seems to be tolerating that well.  He has had no recurrent arrhythmias.  He did have 1 episode of what sounded like anginal chest pain.  He took nitroglycerin with some relief and then called EMS.  On arrival EKG was normal.  He was given the option to go to the ER but he declined.  He has not had any further symptoms.  He had lab work last week from his primary care provider which showed total cholesterol 140, HDL 74, triglycerides 80 and LDL 50 on low-dose atorvastatin.  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 s/p RFCA on 05/01/15   Stroke ([facility]) 2017   TIA and mild stroke; denies stroke symptoms since then     Past Surgical History:  Procedure Laterality Date   ATRIAL FIBRILLATION ABLATION     Dr. Lovena Le   BASAL CELL CARCINOMA EXCISION     off of back   ELECTROPHYSIOLOGIC STUDY N/A 05/01/2015   Procedure: SVT Ablation;  Surgeon: Evans Lance, MD;  Location: [facility];  Service: Cardiovascular;  Laterality: N/A;   EP IMPLANTABLE DEVICE N/A 06/04/2016   Procedure: Loop Recorder Insertion;  Surgeon: Thompson Grayer, MD;  Location: [facility];  Service: Cardiovascular;  Laterality: N/A;   INGUINAL HERNIA REPAIR Bilateral 06/16/2017   Procedure: LAPAROSCOPIC BILATERAL INGUINAL HERNIA REPAIR;  Surgeon: Clovis Riley, MD;  Location: [facility];  Service: General;  Laterality: Bilateral;   INSERTION OF MESH Bilateral 06/16/2017   Procedure: INSERTION OF MESH;  Surgeon: Clovis Riley, MD;  Location: [facility];  Service: General;  Laterality: Bilateral;   MASS EXCISION Right 07/28/2018   Procedure: EXCISION OF RIGHT FOREARM MASS;  Surgeon: Clovis Riley, MD;  Location: [facility];  Service: General;  Laterality: Right;   SQUAMOUS CELL CARCINOMA EXCISION     TEE WITHOUT CARDIOVERSION N/A 06/04/2016   Procedure: TRANSESOPHAGEAL ECHOCARDIOGRAM (TEE);  Surgeon: Pixie Casino, MD;  [facility]: [facility];  Service: Cardiovascular;  Laterality: N/A;    FAMHx:  Family History  Problem Relation Age of Onset   Stroke Mother    Hypertension Mother    Alzheimer's disease Father    Heart failure Father    Down syndrome Daughter     SOCHx:   reports that he has never smoked. He has never used smokeless tobacco. He reports that he does not drink alcohol and does not use drugs.  ALLERGIES:  Allergies  Allergen Reactions   Other Other (See Comments)   No Known  Allergies     ROS: Pertinent items noted in HPI and remainder of comprehensive ROS otherwise negative.  HOME MEDS: Current Outpatient Medications  Medication Sig Dispense Refill   atorvastatin (LIPITOR) 10 MG tablet Take 10 mg by mouth daily. 1/2 tablet daily     cyanocobalamin 1000 MCG tablet Take 1,000 mcg by mouth daily.     denosumab (PROLIA) 60 MG/ML SOSY injection  See admin instructions.     FREESTYLE LITE test strip      glucose monitoring kit (FREESTYLE) monitoring kit 1 each by Does not apply route as needed for other.     Lancets (FREESTYLE) lancets 1 each daily.     lenalidomide (REVLIMID) 5 MG capsule TAKE 1 CAPSULE DAILY FOR 21 DAYS ON AND 7 DAYS OFF, REPEAT EVERY 28 DAYS 21 capsule 0   metFORMIN (GLUCOPHAGE) 1000 MG tablet Take 0.5 tablets (500 mg total) by mouth daily with breakfast. 90 tablet 3   nitroGLYCERIN (NITROSTAT) 0.4 MG SL tablet Place 1 tablet (0.4 mg total) under the tongue every 5 (five) minutes as needed for chest pain (Max 3 doses in 15 mins.). 30 tablet 0   omeprazole (PRILOSEC) 20 MG capsule Take 20 mg by mouth every morning.      Potassium 99 MG TABS 1 tablet     pregabalin (LYRICA) 75 MG capsule Take 75 mg by mouth 3 (three) times daily.     rivaroxaban (XARELTO) 20 MG TABS tablet TAKE 1 TABLET DAILY WITH SUPPER 90 tablet 1   Ascorbic Acid (VITAMIN C) 1000 MG tablet 1 tablet (Patient not taking: Reported on 01/08/2022)     Coenzyme Q10 (CO Q-10) 100 MG CAPS See admin instructions. (Patient not taking: Reported on 01/08/2022)     Ferrous Sulfate (IRON) 90 (18 Fe) MG TABS Take by mouth. (Patient not taking: Reported on 01/08/2022)     Magnesium 400 MG TABS 1 tablet with a meal (Patient not taking: Reported on 01/08/2022)     Vitamin D, Ergocalciferol, (DRISDOL) 1.25 MG (50000 UNIT) CAPS capsule TAKE 1 CAPSULE BY MOUTH 1 TIME A WEEK (Patient not taking: Reported on 01/08/2022) 12 capsule 3   No current facility-administered medications for this visit.     LABS/IMAGING: No results found for this or any previous visit (from the past 48 hour(s)). No results found.  VITALS: BP 123/65    Pulse (!) 53    Ht 5' 8"  (1.727 m)    Wt 190 lb 4.8 oz (86.3 kg)    SpO2 98%    BMI 28.94 kg/m   EXAM: General appearance: alert and no distress Neck: no carotid bruit, no JVD, and thyroid not enlarged, symmetric, no tenderness/mass/nodules Lungs: clear to auscultation bilaterally Heart: regular rate and rhythm Abdomen: soft, non-tender; bowel sounds normal; no masses,  no organomegaly Extremities: extremities normal, atraumatic, no cyanosis or edema Pulses: 2+ and symmetric Skin: Skin color, texture, turgor normal. No rashes or lesions Neurologic: Grossly normal  EKG: Sinus bradycardia at 53 -personally reviewed  ASSESSMENT: Chest pain/stable angina -nonobstructive coronary artery disease by cardiac CT, calcium score 229 (09/2018) Stroke/TIA PAF - brief, found on ILR - on Xarelto Small bidirectional PFO by TEE -on long-term Xarelto Mild CAD - coronary CT angiogram showed mild to moderate coronary disease which was nonobstructive and an intermediate coronary calcium score of 202 (2016) PSVT s/p ablation by Dr. Lovena Le Diabetes type 2 -controlled Relative dyslipidemia Multiple myeloma -in remission Skin cancer  PLAN: 1.   Justin [name] had 1 possible episode of chest pain that seem to be improved with nitro although after being evaluated by EMS declined presentation to the ER.  He has had no further episodes.  He did have some nonobstructive coronary disease by CT in 2019.  He denies any recurrent atrial fibrillation.  His loop recorder battery has likely run out.  He discussed possibly having it removed but did not specifically requested.  He can contact us if he wants to proceed with that.  He is on very little medication at this time including low-dose metformin, Xarelto, low-dose atorvastatin with good cholesterol.  He also takes Lyrica and is on  low-dose Revlimid without recurrence of his multiple myeloma.  Follow-up annually or sooner as necessary.  Pixie Casino, MD, [facility], [place] Director of the Advanced Lipid Disorders &  Cardiovascular Risk Reduction Clinic Diplomate of the American Board of Clinical Lipidology Attending Cardiologist  Direct Dial: [phone]   Fax: [phone]  Website:  [email or web address] 01/08/2022, 8:14 AM

## 2022-01-08 NOTE — Patient Instructions (Signed)
Medication Instructions:  Your physician recommends that you continue on your current medications as directed. Please refer to the Current Medication list given to you today.  *If you need a refill on your cardiac medications before your next appointment, please call your pharmacy*  Follow-Up: At [facility], you and your health needs are our priority.  As part of our continuing mission to provide you with exceptional heart care, we have created designated Provider Care Teams.  These Care Teams include your primary Cardiologist (physician) and Advanced Practice Providers (APPs -  Physician Assistants and Nurse Practitioners) who all work together to provide you with the care you need, when you need it.  We recommend signing up for the patient portal called "MyChart".  Sign up information is provided on this After Visit Summary.  MyChart is used to connect with patients for Virtual Visits (Telemedicine).  Patients are able to view lab/test results, encounter notes, upcoming appointments, etc.  Non-urgent messages can be sent to your provider as well.   To learn more about what you can do with MyChart, go to [email or web address].    Your next appointment:   12 month(s)  The format for your next appointment:   In Person  Provider:   Pixie Casino, MD {   Other Instructions Please contact our office if you would like to have your loop recorder explanted

## 2022-01-09 ENCOUNTER — Other Ambulatory Visit: Payer: Self-pay | Admitting: Internal Medicine

## 2022-01-09 NOTE — Telephone Encounter (Signed)
Prescription refill request for Xarelto received.  Indication: Afib Last office visit:1/23 Weight:86.3 kg Age:81 Scr:1.0 CrCl:70.72  Prescription refilled

## 2022-01-23 ENCOUNTER — Other Ambulatory Visit: Payer: Self-pay

## 2022-01-23 DIAGNOSIS — C9001 Multiple myeloma in remission: Secondary | ICD-10-CM

## 2022-01-23 MED ORDER — LENALIDOMIDE 5 MG PO CAPS: ORAL_CAPSULE | ORAL | 0 refills | Status: DC

## 2022-01-24 ENCOUNTER — Encounter: Payer: Self-pay | Admitting: Hematology

## 2022-01-30 ENCOUNTER — Other Ambulatory Visit: Payer: Self-pay

## 2022-01-30 DIAGNOSIS — C9000 Multiple myeloma not having achieved remission: Secondary | ICD-10-CM

## 2022-01-31 ENCOUNTER — Inpatient Hospital Stay: Payer: Medicare Other | Attending: Hematology

## 2022-01-31 ENCOUNTER — Other Ambulatory Visit: Payer: Self-pay

## 2022-01-31 DIAGNOSIS — E538 Deficiency of other specified B group vitamins: Secondary | ICD-10-CM | POA: Diagnosis not present

## 2022-01-31 DIAGNOSIS — M810 Age-related osteoporosis without current pathological fracture: Secondary | ICD-10-CM | POA: Diagnosis not present

## 2022-01-31 DIAGNOSIS — Z85828 Personal history of other malignant neoplasm of skin: Secondary | ICD-10-CM | POA: Diagnosis not present

## 2022-01-31 DIAGNOSIS — D649 Anemia, unspecified: Secondary | ICD-10-CM | POA: Insufficient documentation

## 2022-01-31 DIAGNOSIS — G629 Polyneuropathy, unspecified: Secondary | ICD-10-CM | POA: Insufficient documentation

## 2022-01-31 DIAGNOSIS — I480 Paroxysmal atrial fibrillation: Secondary | ICD-10-CM | POA: Insufficient documentation

## 2022-01-31 DIAGNOSIS — D696 Thrombocytopenia, unspecified: Secondary | ICD-10-CM | POA: Diagnosis not present

## 2022-01-31 DIAGNOSIS — C9000 Multiple myeloma not having achieved remission: Secondary | ICD-10-CM

## 2022-01-31 LAB — CMP (CANCER CENTER ONLY)
ALT: 9 U/L (ref 0–44)
AST: 17 U/L (ref 15–41)
Albumin: 3.6 g/dL (ref 3.5–5.0)
Alkaline Phosphatase: 50 U/L (ref 38–126)
Anion gap: 5 (ref 5–15)
BUN: 20 mg/dL (ref 8–23)
CO2: 25 mmol/L (ref 22–32)
Calcium: 8.7 mg/dL — ABNORMAL LOW (ref 8.9–10.3)
Chloride: 108 mmol/L (ref 98–111)
Creatinine: 1.01 mg/dL (ref 0.61–1.24)
GFR, Estimated: >60 mL/min (ref >60)
Glucose, Bld: 123 mg/dL — ABNORMAL HIGH (ref 70–99)
Potassium: 4.1 mmol/L (ref 3.5–5.1)
Sodium: 138 mmol/L (ref 135–145)
Total Bilirubin: 0.6 mg/dL (ref 0.3–1.2)
Total Protein: 6.1 g/dL — ABNORMAL LOW (ref 6.5–8.1)

## 2022-01-31 LAB — CBC WITH DIFFERENTIAL (CANCER CENTER ONLY)
Abs Immature Granulocytes: 0 10*3/uL (ref 0.00–0.07)
Basophils Absolute: 0.1 10*3/uL (ref 0.0–0.1)
Basophils Relative: 3 %
Eosinophils Absolute: 0.1 10*3/uL (ref 0.0–0.5)
Eosinophils Relative: 4 %
HCT: 36.1 % — ABNORMAL LOW (ref 39.0–52.0)
Hemoglobin: 12.1 g/dL — ABNORMAL LOW (ref 13.0–17.0)
Immature Granulocytes: 0 %
Lymphocytes Relative: 26 %
Lymphs Abs: 0.6 10*3/uL — ABNORMAL LOW (ref 0.7–4.0)
MCH: 29.7 pg (ref 26.0–34.0)
MCHC: 33.5 g/dL (ref 30.0–36.0)
MCV: 88.5 fL (ref 80.0–100.0)
Monocytes Absolute: 0.4 10*3/uL (ref 0.1–1.0)
Monocytes Relative: 17 %
Neutro Abs: 1.1 10*3/uL — ABNORMAL LOW (ref 1.7–7.7)
Neutrophils Relative %: 50 %
Platelet Count: 123 10*3/uL — ABNORMAL LOW (ref 150–400)
RBC: 4.08 MIL/uL — ABNORMAL LOW (ref 4.22–5.81)
RDW: 14.6 % (ref 11.5–15.5)
WBC Count: 2.2 10*3/uL — ABNORMAL LOW (ref 4.0–10.5)
nRBC: 0 % (ref 0.0–0.2)

## 2022-02-01 LAB — KAPPA/LAMBDA LIGHT CHAINS
Kappa free light chain: 30.1 mg/L — ABNORMAL HIGH (ref 3.3–19.4)
Kappa, lambda light chain ratio: 1.26 (ref 0.26–1.65)
Lambda free light chains: 23.8 mg/L (ref 5.7–26.3)

## 2022-02-05 LAB — MULTIPLE MYELOMA PANEL, SERUM
Albumin SerPl Elph-Mcnc: 3.4 g/dL (ref 2.9–4.4)
Albumin/Glob SerPl: 1.5 (ref 0.7–1.7)
Alpha 1: 0.2 g/dL (ref 0.0–0.4)
Alpha2 Glob SerPl Elph-Mcnc: 0.6 g/dL (ref 0.4–1.0)
B-Globulin SerPl Elph-Mcnc: 0.8 g/dL (ref 0.7–1.3)
Gamma Glob SerPl Elph-Mcnc: 0.8 g/dL (ref 0.4–1.8)
Globulin, Total: 2.4 g/dL (ref 2.2–3.9)
IgA: 115 mg/dL (ref 61–437)
IgG (Immunoglobin G), Serum: 922 mg/dL (ref 603–1613)
IgM (Immunoglobulin M), Srm: 27 mg/dL (ref 15–143)
M Protein SerPl Elph-Mcnc: 0.1 g/dL — ABNORMAL HIGH
Total Protein ELP: 5.8 g/dL — ABNORMAL LOW (ref 6.0–8.5)

## 2022-02-06 DIAGNOSIS — H52203 Unspecified astigmatism, bilateral: Secondary | ICD-10-CM | POA: Diagnosis not present

## 2022-02-06 DIAGNOSIS — H26493 Other secondary cataract, bilateral: Secondary | ICD-10-CM | POA: Diagnosis not present

## 2022-02-06 DIAGNOSIS — E119 Type 2 diabetes mellitus without complications: Secondary | ICD-10-CM | POA: Diagnosis not present

## 2022-02-06 DIAGNOSIS — H04123 Dry eye syndrome of bilateral lacrimal glands: Secondary | ICD-10-CM | POA: Diagnosis not present

## 2022-02-07 ENCOUNTER — Inpatient Hospital Stay (HOSPITAL_BASED_OUTPATIENT_CLINIC_OR_DEPARTMENT_OTHER): Payer: Medicare Other | Admitting: Hematology

## 2022-02-07 ENCOUNTER — Other Ambulatory Visit: Payer: Self-pay

## 2022-02-07 VITALS — BP 102/64 | HR 51 | Temp 97.5°F | Resp 18 | Ht 68.0 in | Wt 193.0 lb

## 2022-02-07 DIAGNOSIS — D696 Thrombocytopenia, unspecified: Secondary | ICD-10-CM | POA: Diagnosis not present

## 2022-02-07 DIAGNOSIS — C9000 Multiple myeloma not having achieved remission: Secondary | ICD-10-CM

## 2022-02-07 DIAGNOSIS — E538 Deficiency of other specified B group vitamins: Secondary | ICD-10-CM | POA: Diagnosis not present

## 2022-02-07 DIAGNOSIS — G629 Polyneuropathy, unspecified: Secondary | ICD-10-CM | POA: Diagnosis not present

## 2022-02-07 DIAGNOSIS — M810 Age-related osteoporosis without current pathological fracture: Secondary | ICD-10-CM | POA: Diagnosis not present

## 2022-02-07 DIAGNOSIS — D649 Anemia, unspecified: Secondary | ICD-10-CM | POA: Diagnosis not present

## 2022-02-13 ENCOUNTER — Encounter: Payer: Self-pay | Admitting: Hematology

## 2022-02-13 DIAGNOSIS — D0461 Carcinoma in situ of skin of right upper limb, including shoulder: Secondary | ICD-10-CM | POA: Diagnosis not present

## 2022-02-13 DIAGNOSIS — L570 Actinic keratosis: Secondary | ICD-10-CM | POA: Diagnosis not present

## 2022-02-13 DIAGNOSIS — D485 Neoplasm of uncertain behavior of skin: Secondary | ICD-10-CM | POA: Diagnosis not present

## 2022-02-13 DIAGNOSIS — D0439 Carcinoma in situ of skin of other parts of face: Secondary | ICD-10-CM | POA: Diagnosis not present

## 2022-02-13 DIAGNOSIS — Z23 Encounter for immunization: Secondary | ICD-10-CM | POA: Diagnosis not present

## 2022-02-13 DIAGNOSIS — C44622 Squamous cell carcinoma of skin of right upper limb, including shoulder: Secondary | ICD-10-CM | POA: Diagnosis not present

## 2022-02-13 NOTE — Progress Notes (Signed)
HEMATOLOGY/ONCOLOGY CLINIC NOTE  Date of Service: .02/07/2022   Patient Care Team: Mayra Neer, MD as PCP - General (Family Medicine) Debara Pickett Nadean Corwin, MD as PCP - Cardiology (Cardiology)   CHIEF COMPLAINTS:  Follow-up for continued evaluation and management of multiple myeloma   HISTORY OF PRESENTING ILLNESS:  Please see previous note for details of initial presentation  CURRENT THERAPY:   Revlimid maintenance 5 mg po 3 weeks on 1 week off.   INTERVAL HISTORY:  Justin [name] is here for continued evaluation and management of his multiple myeloma. He is in good spirits and has no acute new symptoms. He notes he has some chronic back pains with some radicular symptoms.  These have not significantly worsened or changed. No other acute new focal symptoms. No significant toxicities from his Revlimid at 5 mg 3 weeks on 1 week off at this time. Somewhat loose stools at times but no significant uncontrolled diarrhea. 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 s/p RFCA on 05/01/15   Stroke ([place]) 2017   TIA and mild stroke; denies stroke symptoms since then     SURGICAL HISTORY: Past Surgical History:  Procedure Laterality Date   ATRIAL FIBRILLATION ABLATION     Dr. Lovena Le   BASAL CELL CARCINOMA EXCISION     off of back   ELECTROPHYSIOLOGIC STUDY N/A 05/01/2015    Procedure: SVT Ablation;  Surgeon: Evans Lance, MD;  Location: [place] [facility];  Service: Cardiovascular;  Laterality: N/A;   EP IMPLANTABLE DEVICE N/A 06/04/2016   Procedure: Loop Recorder Insertion;  Surgeon: Thompson Grayer, MD;  Location: [place] [facility];  Service: Cardiovascular;  Laterality: N/A;   INGUINAL HERNIA REPAIR Bilateral 06/16/2017   Procedure: LAPAROSCOPIC BILATERAL INGUINAL HERNIA REPAIR;  Surgeon: Clovis Riley, MD;  Location: [place];  Service: General;  Laterality: Bilateral;   INSERTION OF MESH Bilateral 06/16/2017   Procedure: INSERTION OF MESH;  Surgeon: Clovis Riley, MD;  Location: [place];  Service: General;  Laterality: Bilateral;   MASS EXCISION Right 07/28/2018   Procedure: EXCISION OF RIGHT FOREARM MASS;  Surgeon: Clovis Riley, MD;  Location: [facility];  Service: General;  Laterality: Right;   SQUAMOUS CELL CARCINOMA EXCISION     TEE WITHOUT CARDIOVERSION N/A 06/04/2016   Procedure: TRANSESOPHAGEAL ECHOCARDIOGRAM (TEE);  Surgeon: Pixie Casino, MD;  Location: [facility];  Service: Cardiovascular;  Laterality: N/A;    SOCIAL HISTORY: Social History   Socioeconomic History   Marital status: Married    Spouse name: Not on file   Number of children: Not on file   Years of education: Not on file   Highest education level: Not on file  Occupational History   Not on file  Tobacco Use   Smoking status: Never  Smokeless tobacco: Never  Vaping Use   Vaping Use: Never used  Substance and Sexual Activity   Alcohol use: No   Drug use: No   Sexual activity: Not on file  Other Topics Concern   Not on file  Social History Narrative   Not on file   Social Determinants of Health   Financial Resource Strain: Not on file  Food Insecurity: Not on file  Transportation Needs: Not on file  Physical Activity: Not on file  Stress: Not on file  Social Connections: Not on file  Intimate Partner Violence: Not on file   Avantae Bither is retired. His  daughter is Mudlogger of nursing for a nursing home in [place], [place].    FAMILY HISTORY: Family History  Problem Relation Age of Onset   Stroke Mother    Hypertension Mother    Alzheimer's disease Father    Heart failure Father    Down syndrome Daughter     ALLERGIES:  is allergic to other and no known allergies.  MEDICATIONS:  Current Outpatient Medications  Medication Sig Dispense Refill   atorvastatin (LIPITOR) 10 MG tablet Take 10 mg by mouth daily. 1/2 tablet daily     cyanocobalamin 1000 MCG tablet Take 1,000 mcg by mouth daily.     FREESTYLE LITE test strip      lenalidomide (REVLIMID) 5 MG capsule TAKE 1 CAPSULE DAILY FOR 21 DAYS ON AND 7 DAYS OFF, REPEAT EVERY 28 DAYS 21 capsule 0   metFORMIN (GLUCOPHAGE) 1000 MG tablet Take 0.5 tablets (500 mg total) by mouth daily with breakfast. 90 tablet 3   nitroGLYCERIN (NITROSTAT) 0.4 MG SL tablet Place 1 tablet (0.4 mg total) under the tongue every 5 (five) minutes as needed for chest pain (Max 3 doses in 15 mins.). 30 tablet 0   omeprazole (PRILOSEC) 20 MG capsule Take 20 mg by mouth every morning.      Potassium 99 MG TABS 1 tablet     pregabalin (LYRICA) 75 MG capsule Take 75 mg by mouth 3 (three) times daily.     XARELTO 20 MG TABS tablet TAKE 1 TABLET DAILY WITH SUPPER 90 tablet 3   denosumab (PROLIA) 60 MG/ML SOSY injection See admin instructions.     denosumab (PROLIA) 60 MG/ML SOSY injection Inject 60 mg into the skin every 6 (six) months.     glucose monitoring kit (FREESTYLE) monitoring kit 1 each by Does not apply route as needed for other.     Lancets (FREESTYLE) lancets 1 each daily.     No current facility-administered medications for this visit.    REVIEW OF SYSTEMS:   10 Point review of Systems was done is negative except as noted above.  PHYSICAL EXAMINATION:   ECOG PERFORMANCE STATUS: 2 - Symptomatic, <50% confined to bed Vitals:   02/07/22 1035  BP: 102/64  Pulse: (!) 51  Resp: 18  Temp: (!) 97.5 F (36.4  C)  SpO2: 99%     Wt Readings from Last 3 Encounters:  02/07/22 193 lb (87.5 kg)  01/08/22 190 lb 4.8 oz (86.3 kg)  11/29/21 186 lb 14.4 oz (84.8 kg)   Body mass index is 29.35 kg/m.  NAD GENERAL:alert, in no acute distress and comfortable SKIN: no acute rashes, no significant lesions EYES: conjunctiva are pink and non-injected, sclera anicteric OROPHARYNX: MMM, no exudates, no oropharyngeal erythema or ulceration NECK: supple, no JVD LYMPH:  no palpable lymphadenopathy in the cervical, axillary or inguinal regions LUNGS: clear to auscultation b/l  with normal respiratory effort HEART: regular rate & rhythm ABDOMEN:  normoactive bowel sounds , non tender, not distended. Extremity: no pedal edema PSYCH: alert & oriented x 3 with fluent speech NEURO: no focal motor/sensory deficits   LABORATORY DATA:  I have reviewed the data as listed. CBC Latest Ref Rng & Units 01/31/2022 11/22/2021 08/29/2021  WBC 4.0 - 10.5 K/uL 2.2(L) 3.5(L) 2.5(L)  Hemoglobin 13.0 - 17.0 g/dL 12.1(L) 11.5(L) 13.1  Hematocrit 39.0 - 52.0 % 36.1(L) 35.1(L) 39.8  Platelets 150 - 400 K/uL 123(L) 163 124(L)  ANC 1.1k  CMP Latest Ref Rng & Units 01/31/2022 11/22/2021 08/29/2021  Glucose 70 - 99 mg/dL 123(H) 138(H) 119(H)  BUN 8 - 23 mg/dL 20 18 13   Creatinine 0.61 - 1.24 mg/dL 1.01 1.16 1.08  Sodium 135 - 145 mmol/L 138 140 138  Potassium 3.5 - 5.1 mmol/L 4.1 3.5 4.0  Chloride 98 - 111 mmol/L 108 109 108  CO2 22 - 32 mmol/L 25 21(L) 21(L)  Calcium 8.9 - 10.3 mg/dL 8.7(L) 8.3(L) 9.0  Total Protein 6.5 - 8.1 g/dL 6.1(L) 6.6 6.4(L)  Total Bilirubin 0.3 - 1.2 mg/dL 0.6 0.7 0.7  Alkaline Phos 38 - 126 U/L 50 163(H) 59  AST 15 - 41 U/L 17 31 19   ALT 0 - 44 U/L 9 37 9        RADIOGRAPHIC STUDIES: I have personally reviewed the radiological images as listed and agreed with the findings in the report.       Bone Marrow Biopsy 12/24/2016 (Accession [id number])  Diagnosis Bone Marrow, Aspirate,Biopsy, and Clot,  left iliac crest - HYPERCELLULAR BONE MARROW FOR AGE WITH PLASMA CELL NEOPLASM. - SEE COMMENT. PERIPHERAL BLOOD: - NORMOCYTIC-NORMOCHROMIC ANEMIA. - LEUKOPENIA.  DG Bone Survey Met (Accession [id number]) (Order [id number])  Imaging  Date: 11/28/2016 Department: [facility] Released By: Pricilla Riffle Authorizing: Brunetta Genera, MD  Exam Information   Status Exam Begun  Exam Ended   Final [99] 11/28/2016 11:59 AM 11/28/2016 12:36 PM  PACS Images   Show images for DG Bone Survey Met  Study Result   CLINICAL DATA:  Monoclonal gammopathy of unknown significance. History of squamous cell carcinoma excision, basal cell carcinoma excision.   EXAM: METASTATIC BONE SURVEY   COMPARISON:  CT neck, chest, abdomen and pelvis from 10/29/16, MRI of the head from 06/01/2016, CXR 10/27/2016, lumbar spine radiographs 06/24/2016   FINDINGS: Lateral skull: Small occipital lucency which may represent a normal arachnoid granulation posteriorly in the occiput. No definite lytic abnormality.   Cervical spine AP and lateral: Disc space narrowing at C2-3, and from C4 through C7. C4-5, C5-6 and C6-7 uncovertebral joint spurring bilaterally. No lytic abnormality.   Thoracic spine AP and lateral: T8-9 right-sided osteophytes and to a lesser degree T9-10. No lytic abnormality. Slight multilevel lumbar thoracic disc space narrowing likely degenerative.   Lumbar spine AP and lateral: Disc space narrowing at L4-5 and to a greater extent L5-S1. No lytic disease. L3 through S1 facet sclerosis.   AP pelvis: Negative for lytic disease.   Bilateral upper and lower extremities shoulders through wrist and from the hips through ankle: Negative for lytic disease. Joint space narrowing of the femorotibial compartments both knees. Subchondral cyst of the right patella.   CXR: Clear lungs. Cardiac implantable monitoring device projects over the left heart.  Aortic atherosclerosis. No lytic disease.   IMPRESSION: No findings suspicious for lytic disease.     Electronically Signed   By: Ashley Royalty  M.D.   On: 11/28/2016 14:58     ASSESSMENT & PLAN:   81 y.o. 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 (12/2016) Peak M spike 2.6  Cytogenetics and Myeloma FISH panel.- trisomy 11 Patient has normocytic anemia without any other clear etiology. (>2g/dl lower that lower limit of normal which is 13) which per criteria would place this in the Multiple myeloma category as opposed to Smoldering Multiple myeloma (Borderline criterion)  No overt renal failure or hypercalcemia at this time No focal bone pains though he has significant chronic back pain related to degenerative disc disease. Bone survey shows no overt lytic lesions.  10/16/18 CT Coronary Morph revealed Coronary artery calcium score 299 Agatston units, this places the patient in the 47th percentile for age and gender, suggesting intermediate risk for future cardiac events. 2. Nonobstructive coronary disease.  12/08/18 Bone Density study revealed that the pt has osteoporosis  2) Anemia and thrombocytopenia -- related to treatment (Revlimid) -stable  3) Grade 1 Neuropathy -- in b/l feet and halfway up lower leg without pain -[place] to [employer] -improved off [place]  4) Borderline low B12 levels, previously 299 --- on replacement -  -continue replacement.  5) h/o recurrent CVA - thought to be related to small vessel disease as per neurology. Has a small PFO which could be an additional risk factor as well as his afib  6)  P afib Plan -Continue on Xarelto per cardiology.  7) H/o recurrent SCC -Underwent surgical excision on right forearm with Dr. Romana Juniper on 07/28/18. His surgical pathology showed evidence of Squamous Cell Carcinoma. Plan -continue dermatology followup  10) Lower extremity  discomfort - improved, stable. Likely Discogenic pain -[place] on 12/2017 revealed no blood clot -Pt has established care with orthopedist and PT.  11) Patient Active Problem List   Diagnosis Date Noted   Osteoporosis 03/10/2019   Multiple myeloma without remission ([place]) 03/10/2019   Chest pain 09/27/2018   Multiple myeloma ([place]) 09/27/2018   Degeneration of lumbar intervertebral disc 01/30/2018   Low back pain 01/30/2018   Lumbar radiculopathy 01/30/2018   History of loop recorder 06/12/2017   Cryptogenic stroke ([place]) 10/28/2016   Gait abnormality    History of CVA (cerebrovascular accident)    History of TIA (transient ischemic attack)    Benign essential HTN    Paroxysmal SVT (supraventricular tachycardia) (HCC)    Acute blood loss anemia    Acute ischemic stroke ([place])    CVA (cerebral vascular accident) ([place]) 10/26/2016   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   History of recent stroke 06/06/2016   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2k CMP within normal limits no hypercalcemia creatinine 1.01 Myeloma labs show M spike of 0.1 g/dL Serum kappa lambda free light chains show normal ratio  -No lab or clinical evidence of myeloma  progression at this time -No notable toxicities from Revlimid at the current dose -Continue maintenance Revlimid 5 mg 3 weeks on 1 week off -Continue Prolia 60 mg every 6  months for osteoporosis  FOLLOW UP: Return to clinic with Dr. Irene Limbo in 10 weeks Labs in 9 weeks PLz schedule next 2 doses of Prolia every 6 months (Next dose - end of may 2023)   All of the patient's questions were answered with apparent satisfaction. The patient knows to call the clinic with any problems, questions or concerns.  The total time spent in the appointment was 31 minutes*.  All of the patient's questions were answered with apparent satisfaction. The patient knows to call the clinic with any problems, questions or concerns.   [place] Lone MD MS AAHIVMS [facility] Hematology/Oncology Physician [facility]  .*Total Encounter Time as defined by the Centers for Medicare and Medicaid Services includes, in addition to the face-to-face time of a patient visit (documented in the note above) non-face-to-face time: obtaining and reviewing outside history, ordering and reviewing medications, tests or procedures, care coordination (communications with other health care professionals or caregivers) and documentation in the medical record.    [place] Lone MD [place] AAHIVMS [facility] Hematology/Oncology Physician [facility]

## 2022-02-19 ENCOUNTER — Other Ambulatory Visit: Payer: Self-pay | Admitting: Hematology

## 2022-02-19 ENCOUNTER — Encounter: Payer: Self-pay | Admitting: Hematology

## 2022-02-19 DIAGNOSIS — C9001 Multiple myeloma in remission: Secondary | ICD-10-CM

## 2022-02-20 DIAGNOSIS — D490 Neoplasm of unspecified behavior of digestive system: Secondary | ICD-10-CM

## 2022-02-20 HISTORY — DX: Neoplasm of unspecified behavior of digestive system: D49.0

## 2022-03-14 ENCOUNTER — Other Ambulatory Visit: Payer: Self-pay | Admitting: Hematology

## 2022-03-14 DIAGNOSIS — D0439 Carcinoma in situ of skin of other parts of face: Secondary | ICD-10-CM | POA: Diagnosis not present

## 2022-03-14 DIAGNOSIS — C9001 Multiple myeloma in remission: Secondary | ICD-10-CM

## 2022-03-20 ENCOUNTER — Encounter: Payer: Self-pay | Admitting: Hematology

## 2022-03-21 ENCOUNTER — Ambulatory Visit
Admission: RE | Admit: 2022-03-21 | Discharge: 2022-03-21 | Disposition: A | Discharge status: Home / Self Care | Payer: Medicare Other | Source: Ambulatory Visit | Attending: Surgery | Admitting: Surgery

## 2022-03-21 ENCOUNTER — Other Ambulatory Visit: Payer: Self-pay | Admitting: Surgery

## 2022-03-21 ENCOUNTER — Encounter: Payer: Self-pay | Admitting: Hematology

## 2022-03-21 DIAGNOSIS — N261 Atrophy of kidney (terminal): Secondary | ICD-10-CM | POA: Diagnosis not present

## 2022-03-21 DIAGNOSIS — K862 Cyst of pancreas: Secondary | ICD-10-CM

## 2022-03-21 DIAGNOSIS — K828 Other specified diseases of gallbladder: Secondary | ICD-10-CM | POA: Diagnosis not present

## 2022-03-21 DIAGNOSIS — K449 Diaphragmatic hernia without obstruction or gangrene: Secondary | ICD-10-CM | POA: Diagnosis not present

## 2022-04-10 ENCOUNTER — Other Ambulatory Visit: Payer: Self-pay

## 2022-04-10 ENCOUNTER — Other Ambulatory Visit: Payer: Medicare Other

## 2022-04-10 DIAGNOSIS — C9001 Multiple myeloma in remission: Secondary | ICD-10-CM

## 2022-04-10 MED ORDER — LENALIDOMIDE 5 MG PO CAPS: ORAL_CAPSULE | ORAL | 0 refills | Status: DC

## 2022-04-17 ENCOUNTER — Ambulatory Visit: Payer: Medicare Other | Admitting: Hematology

## 2022-04-24 ENCOUNTER — Other Ambulatory Visit: Payer: Self-pay

## 2022-04-24 DIAGNOSIS — C9000 Multiple myeloma not having achieved remission: Secondary | ICD-10-CM

## 2022-04-25 ENCOUNTER — Other Ambulatory Visit: Payer: Self-pay

## 2022-04-25 ENCOUNTER — Inpatient Hospital Stay: Payer: Medicare Other | Attending: Hematology

## 2022-04-25 DIAGNOSIS — I480 Paroxysmal atrial fibrillation: Secondary | ICD-10-CM | POA: Diagnosis not present

## 2022-04-25 DIAGNOSIS — Z8673 Personal history of transient ischemic attack (TIA), and cerebral infarction without residual deficits: Secondary | ICD-10-CM | POA: Insufficient documentation

## 2022-04-25 DIAGNOSIS — D649 Anemia, unspecified: Secondary | ICD-10-CM | POA: Diagnosis not present

## 2022-04-25 DIAGNOSIS — D696 Thrombocytopenia, unspecified: Secondary | ICD-10-CM | POA: Diagnosis not present

## 2022-04-25 DIAGNOSIS — M549 Dorsalgia, unspecified: Secondary | ICD-10-CM | POA: Diagnosis not present

## 2022-04-25 DIAGNOSIS — M810 Age-related osteoporosis without current pathological fracture: Secondary | ICD-10-CM | POA: Diagnosis not present

## 2022-04-25 DIAGNOSIS — G8929 Other chronic pain: Secondary | ICD-10-CM | POA: Insufficient documentation

## 2022-04-25 DIAGNOSIS — Z85828 Personal history of other malignant neoplasm of skin: Secondary | ICD-10-CM | POA: Diagnosis not present

## 2022-04-25 DIAGNOSIS — Z7901 Long term (current) use of anticoagulants: Secondary | ICD-10-CM | POA: Diagnosis not present

## 2022-04-25 DIAGNOSIS — Z79899 Other long term (current) drug therapy: Secondary | ICD-10-CM | POA: Insufficient documentation

## 2022-04-25 DIAGNOSIS — C9000 Multiple myeloma not having achieved remission: Secondary | ICD-10-CM | POA: Insufficient documentation

## 2022-04-25 LAB — CMP (CANCER CENTER ONLY)
ALT: 9 U/L (ref 0–44)
AST: 17 U/L (ref 15–41)
Albumin: 3.4 g/dL — ABNORMAL LOW (ref 3.5–5.0)
Alkaline Phosphatase: 51 U/L (ref 38–126)
Anion gap: 5 (ref 5–15)
BUN: 17 mg/dL (ref 8–23)
CO2: 24 mmol/L (ref 22–32)
Calcium: 7.9 mg/dL — ABNORMAL LOW (ref 8.9–10.3)
Chloride: 110 mmol/L (ref 98–111)
Creatinine: 1.1 mg/dL (ref 0.61–1.24)
GFR, Estimated: >60 mL/min (ref >60)
Glucose, Bld: 122 mg/dL — ABNORMAL HIGH (ref 70–99)
Potassium: 3.6 mmol/L (ref 3.5–5.1)
Sodium: 139 mmol/L (ref 135–145)
Total Bilirubin: 0.8 mg/dL (ref 0.3–1.2)
Total Protein: 5.6 g/dL — ABNORMAL LOW (ref 6.5–8.1)

## 2022-04-25 LAB — CBC WITH DIFFERENTIAL (CANCER CENTER ONLY)
Abs Immature Granulocytes: 0 10*3/uL (ref 0.00–0.07)
Basophils Absolute: 0.1 10*3/uL (ref 0.0–0.1)
Basophils Relative: 2 %
Eosinophils Absolute: 0.2 10*3/uL (ref 0.0–0.5)
Eosinophils Relative: 6 %
HCT: 32.9 % — ABNORMAL LOW (ref 39.0–52.0)
Hemoglobin: 11.3 g/dL — ABNORMAL LOW (ref 13.0–17.0)
Immature Granulocytes: 0 %
Lymphocytes Relative: 25 %
Lymphs Abs: 0.7 10*3/uL (ref 0.7–4.0)
MCH: 30.4 pg (ref 26.0–34.0)
MCHC: 34.3 g/dL (ref 30.0–36.0)
MCV: 88.4 fL (ref 80.0–100.0)
Monocytes Absolute: 0.3 10*3/uL (ref 0.1–1.0)
Monocytes Relative: 10 %
Neutro Abs: 1.6 10*3/uL — ABNORMAL LOW (ref 1.7–7.7)
Neutrophils Relative %: 57 %
Platelet Count: 122 10*3/uL — ABNORMAL LOW (ref 150–400)
RBC: 3.72 MIL/uL — ABNORMAL LOW (ref 4.22–5.81)
RDW: 15 % (ref 11.5–15.5)
WBC Count: 2.8 10*3/uL — ABNORMAL LOW (ref 4.0–10.5)
nRBC: 0 % (ref 0.0–0.2)

## 2022-04-26 LAB — KAPPA/LAMBDA LIGHT CHAINS
Kappa free light chain: 32 mg/L — ABNORMAL HIGH (ref 3.3–19.4)
Kappa, lambda light chain ratio: 1.39 (ref 0.26–1.65)
Lambda free light chains: 23.1 mg/L (ref 5.7–26.3)

## 2022-04-29 LAB — MULTIPLE MYELOMA PANEL, SERUM
Albumin SerPl Elph-Mcnc: 3 g/dL (ref 2.9–4.4)
Albumin/Glob SerPl: 1.4 (ref 0.7–1.7)
Alpha 1: 0.2 g/dL (ref 0.0–0.4)
Alpha2 Glob SerPl Elph-Mcnc: 0.6 g/dL (ref 0.4–1.0)
B-Globulin SerPl Elph-Mcnc: 0.8 g/dL (ref 0.7–1.3)
Gamma Glob SerPl Elph-Mcnc: 0.7 g/dL (ref 0.4–1.8)
Globulin, Total: 2.3 g/dL (ref 2.2–3.9)
IgA: 97 mg/dL (ref 61–437)
IgG (Immunoglobin G), Serum: 786 mg/dL (ref 603–1613)
IgM (Immunoglobulin M), Srm: 13 mg/dL — ABNORMAL LOW (ref 15–143)
M Protein SerPl Elph-Mcnc: 0.1 g/dL — ABNORMAL HIGH
Total Protein ELP: 5.3 g/dL — ABNORMAL LOW (ref 6.0–8.5)

## 2022-05-01 ENCOUNTER — Other Ambulatory Visit: Payer: Self-pay

## 2022-05-01 ENCOUNTER — Inpatient Hospital Stay (HOSPITAL_BASED_OUTPATIENT_CLINIC_OR_DEPARTMENT_OTHER): Payer: Medicare Other | Admitting: Hematology

## 2022-05-01 VITALS — BP 123/60 | HR 51 | Temp 97.3°F | Resp 20 | Wt 197.1 lb

## 2022-05-01 DIAGNOSIS — D696 Thrombocytopenia, unspecified: Secondary | ICD-10-CM | POA: Diagnosis not present

## 2022-05-01 DIAGNOSIS — D649 Anemia, unspecified: Secondary | ICD-10-CM | POA: Diagnosis not present

## 2022-05-01 DIAGNOSIS — C9000 Multiple myeloma not having achieved remission: Secondary | ICD-10-CM

## 2022-05-01 DIAGNOSIS — I480 Paroxysmal atrial fibrillation: Secondary | ICD-10-CM | POA: Diagnosis not present

## 2022-05-01 DIAGNOSIS — G8929 Other chronic pain: Secondary | ICD-10-CM | POA: Diagnosis not present

## 2022-05-01 DIAGNOSIS — M810 Age-related osteoporosis without current pathological fracture: Secondary | ICD-10-CM | POA: Diagnosis not present

## 2022-05-01 DIAGNOSIS — Z23 Encounter for immunization: Secondary | ICD-10-CM | POA: Diagnosis not present

## 2022-05-01 MED ORDER — ERGOCALCIFEROL 1.25 MG (50000 UT) PO CAPS: 50000.0000 [IU] | ORAL_CAPSULE | ORAL | 5 refills | Status: DC

## 2022-05-02 DIAGNOSIS — L905 Scar conditions and fibrosis of skin: Secondary | ICD-10-CM | POA: Diagnosis not present

## 2022-05-02 DIAGNOSIS — C44622 Squamous cell carcinoma of skin of right upper limb, including shoulder: Secondary | ICD-10-CM | POA: Diagnosis not present

## 2022-05-06 ENCOUNTER — Other Ambulatory Visit: Payer: Self-pay | Admitting: Hematology

## 2022-05-06 DIAGNOSIS — C9001 Multiple myeloma in remission: Secondary | ICD-10-CM

## 2022-05-06 NOTE — Telephone Encounter (Signed)
Refill per MD OV note ?

## 2022-05-07 ENCOUNTER — Encounter: Payer: Self-pay | Admitting: Hematology

## 2022-05-07 NOTE — Progress Notes (Signed)
? ? ? ?HEMATOLOGY/ONCOLOGY CLINIC NOTE ? ?Date of Service: .05/01/2022 ? ? ?Patient Care Team: ?Mayra Neer, MD as PCP - General (Family Medicine) ?Pixie Casino, MD as PCP - Cardiology (Cardiology) ? ? ?CHIEF COMPLAINTS:  ?Follow-up for continued valuation and management of multiple myeloma ? ? ?HISTORY OF PRESENTING ILLNESS:  ?Please see previous note for details of initial presentation ? ?CURRENT THERAPY:   ?Revlimid maintenance 5 mg po 3 weeks on 1 week off. ? ? ?INTERVAL HISTORY: ? ?Justin [name] is here for continued evaluation and management of his multiple myeloma.  He notes no acute new focal symptoms.  Intermittent more prominent skin bruising with trauma. ?Chronic back pain issues which have not worsened. ?Had a good vacation with his family in [place]. ?No notable toxicities from his current dose of Revlimid. ?Labs done 04/25/2022 were reviewed in detail with the patient. ? ? ?MEDICAL HISTORY:  ?Past Medical History:  ?Diagnosis Date  ? Anemia   ? Arthritis   ? Cancer [facility])   ? 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 Diabetes mellitus without complication ([place])   ? GERD (gastroesophageal reflux disease)   ? Gout   ? History of loop recorder   ? Hypertension   ? Left leg weakness   ? Multiple myeloma ([place]) 09/27/2018  ? Paroxysmal SVT (supraventricular tachycardia) ([facility])   ? a. s/p RFCA on 05/01/15  ? Stroke [facility]) 2017  ? TIA and mild stroke; denies stroke symptoms since then   ? ? ?SURGICAL HISTORY: ?Past Surgical History:  ?Procedure Laterality Date  ? ATRIAL FIBRILLATION ABLATION    ? Dr. Lovena Le  ? BASAL CELL CARCINOMA EXCISION    ? off of back  ? ELECTROPHYSIOLOGIC STUDY N/A 05/01/2015  ? Procedure: SVT Ablation;  Surgeon: Evans Lance, MD;  Location: [place] [facility];  Service: Cardiovascular;  Laterality: N/A;  ? EP IMPLANTABLE DEVICE N/A 06/04/2016  ? Procedure: Loop Recorder Insertion;  Surgeon: Thompson Grayer, MD;  Location: [place] [facility];  Service: Cardiovascular;   Laterality: N/A;  ? INGUINAL HERNIA REPAIR Bilateral 06/16/2017  ? Procedure: LAPAROSCOPIC BILATERAL INGUINAL HERNIA REPAIR;  Surgeon: Clovis Riley, MD;  Location: [place];  Service: General;  Laterality: Bilateral;  ? INSERTION OF MESH Bilateral 06/16/2017  ? Procedure: INSERTION OF MESH;  Surgeon: Clovis Riley, MD;  Location: [place];  Service: General;  Laterality: Bilateral;  ? MASS EXCISION Right 07/28/2018  ? Procedure: EXCISION OF RIGHT FOREARM MASS;  Surgeon: Clovis Riley, MD;  [facility];  Service: General;  Laterality: Right;  ? SQUAMOUS CELL CARCINOMA EXCISION    ? TEE WITHOUT CARDIOVERSION N/A 06/04/2016  ? Procedure: TRANSESOPHAGEAL ECHOCARDIOGRAM (TEE);  Surgeon: Pixie Casino, MD;  Location: [facility]OSCOPY;  Service: Cardiovascular;  Laterality: N/A;  ? ? ?SOCIAL HISTORY: ?Social History  ? ?Socioeconomic History  ? Marital status: Married  ?  Spouse name: Not on file  ? Number of children: Not on file  ? Years of education: Not on file  ? Highest education level: Not on file  ?Occupational History  ? Not on file  ?Tobacco Use  ? Smoking status: Never  ? Smokeless tobacco: Never  ?Vaping Use  ? Vaping Use: Never used  ?Substance and Sexual Activity  ? Alcohol use: No  ? Drug use: No  ? Sexual activity: Not on file  ?Other Topics Concern  ? Not on file  ?Social History Narrative  ? Not on file  ? ?  Social Determinants of Health  ? ?Financial Resource Strain: Not on file  ?Food Insecurity: Not on file  ?Transportation Needs: Not on file  ?Physical Activity: Not on file  ?Stress: Not on file  ?Social Connections: Not on file  ?Intimate Partner Violence: Not on file  ? ?Justin [name] is retired. His daughter is Mudlogger of nursing for a nursing home in [place], [place].  ? ? ?FAMILY HISTORY: ?Family History  ?Problem Relation Age of Onset  ? Stroke Mother   ? Hypertension Mother   ? Alzheimer's disease Father   ? Heart failure Father   ? Down syndrome Daughter   ? ? ?ALLERGIES:  is allergic to other  and no known allergies. ? ?MEDICATIONS:  ?Current Outpatient Medications  ?Medication Sig Dispense Refill  ? ergocalciferol (VITAMIN D2) 1.25 MG (50000 UT) capsule Take 1 capsule (50,000 Units total) by mouth once a week. 12 capsule 5  ? atorvastatin (LIPITOR) 10 MG tablet Take 10 mg by mouth daily. 1/2 tablet daily    ? cyanocobalamin 1000 MCG tablet Take 1,000 mcg by mouth daily.    ? denosumab (PROLIA) 60 MG/ML SOSY injection See admin instructions.    ? denosumab (PROLIA) 60 MG/ML SOSY injection Inject 60 mg into the skin every 6 (six) months.    ? FREESTYLE LITE test strip     ? glucose monitoring kit (FREESTYLE) monitoring kit 1 each by Does not apply route as needed for other.    ? Lancets (FREESTYLE) lancets 1 each daily.    ? lenalidomide (REVLIMID) 5 MG capsule TAKE 1 CAPSULE DAILY FOR 21 DAYS ON, THEN 7 DAYS OFF. REPEAT EVERY 28 DAYS. 21 capsule 0  ? metFORMIN (GLUCOPHAGE) 1000 MG tablet Take 0.5 tablets (500 mg total) by mouth daily with breakfast. 90 tablet 3  ? nitroGLYCERIN (NITROSTAT) 0.4 MG SL tablet Place 1 tablet (0.4 mg total) under the tongue every 5 (five) minutes as needed for chest pain (Max 3 doses in 15 mins.). 30 tablet 0  ? omeprazole (PRILOSEC) 20 MG capsule Take 20 mg by mouth every morning.     ? Potassium 99 MG TABS 1 tablet    ? pregabalin (LYRICA) 75 MG capsule Take 75 mg by mouth 3 (three) times daily.    ? XARELTO 20 MG TABS tablet TAKE 1 TABLET DAILY WITH SUPPER 90 tablet 3  ? ?No current facility-administered medications for this visit.  ? ? ?REVIEW OF SYSTEMS:   ?10 Point review of Systems was done is negative except as noted above. ? ?PHYSICAL EXAMINATION:  ? ?ECOG PERFORMANCE STATUS: 2 - Symptomatic, <50% confined to bed ?Vitals:  ? 05/01/22 1258  ?BP: 123/60  ?Pulse: (!) 51  ?Resp: 20  ?Temp: (!) 97.3 ?F (36.3 ?C)  ?SpO2: 100%  ? ? ? ?Wt Readings from Last 3 Encounters:  ?05/01/22 197 lb 1.6 oz (89.4 kg)  ?02/07/22 193 lb (87.5 kg)  ?01/08/22 190 lb 4.8 oz (86.3 kg)  ? ?Body  mass index is 29.97 kg/m?[name] Kitchen  ?NAD ?GENERAL:alert, in no acute distress and comfortable ?SKIN: no acute rashes, no significant lesions ?EYES: conjunctiva are pink and non-injected, sclera anicteric ?OROPHARYNX: MMM, no exudates, no oropharyngeal erythema or ulceration ?NECK: supple, no JVD ?LYMPH:  no palpable lymphadenopathy in the cervical, axillary or inguinal regions ?LUNGS: clear to auscultation b/l with normal respiratory effort ?HEART: regular rate & rhythm ?ABDOMEN:  normoactive bowel sounds , non tender, not distended. ?Extremity: no pedal edema ?PSYCH: alert & oriented x 3 with  fluent speech ?NEURO: no focal motor/sensory deficits ? ? ?LABORATORY DATA:  ?I have reviewed the data as listed. ? ?  Latest Ref Rng & Units 04/25/2022  ? 11:03 AM 01/31/2022  ? 10:39 AM 11/22/2021  ? 10:13 AM  ?CBC  ?WBC 4.0 - 10.5 K/uL 2.8   2.2   3.5    ?Hemoglobin 13.0 - 17.0 g/dL 11.3   12.1   11.5    ?Hematocrit 39.0 - 52.0 % 32.9   36.1   35.1    ?Platelets 150 - 400 K/uL 122   123   163    ?ANC 1.1k ? ? ?  Latest Ref Rng & Units 04/25/2022  ? 11:03 AM 01/31/2022  ? 10:39 AM 11/22/2021  ? 10:13 AM  ?CMP  ?Glucose 70 - 99 mg/dL 122   123   138    ?BUN 8 - 23 mg/dL _0 ?Creatinine 0.61 - 1.24 mg/dL 1.10   1.01   1.16    ?Sodium 135 - 145 mmol/L 139   138   140    ?Potassium 3.5 - 5.1 mmol/L 3.6   4.1   3.5    ?Chloride 98 - 111 mmol/L 110   108   109    ?CO2 22 - 32 mmol/L _1 ?Calcium 8.9 - 10.3 mg/dL 7.9   8.7   8.3    ?Total Protein 6.5 - 8.1 g/dL 5.6   6.1   6.6    ?Total Bilirubin 0.3 - 1.2 mg/dL 0.8   0.6   0.7    ?Alkaline Phos 38 - 126 U/L 51   50   163    ?AST 15 - 41 U/L _2 ?ALT 0 - 44 U/L 9   9   37    ? ? ? ? ? ? ?RADIOGRAPHIC STUDIES: ?I have personally reviewed the radiological images as listed and agreed with the findings in the report. ? ? ? ? ? ? ?Bone Marrow Biopsy 12/24/2016 (Accession [id number])  ?Diagnosis ?Bone Marrow, Aspirate,Biopsy, and Clot, left iliac crest ?- HYPERCELLULAR BONE  MARROW FOR AGE WITH PLASMA CELL NEOPLASM. ?- SEE COMMENT. ?PERIPHERAL BLOOD: ?- NORMOCYTIC-NORMOCHROMIC ANEMIA. ?- LEUKOPENIA. ? ?DG Bone Survey Met (Accession [id number]) (Order [id number])  ?Imaging  ?Date:

## 2022-05-21 ENCOUNTER — Telehealth: Payer: Self-pay | Admitting: Hematology

## 2022-05-21 NOTE — Telephone Encounter (Signed)
Scheduled p[er 5/26 in basket, pt has been called and confirmed

## 2022-05-22 ENCOUNTER — Inpatient Hospital Stay: Payer: Medicare Other

## 2022-05-22 VITALS — BP 139/78 | HR 50 | Temp 97.9°F | Resp 20

## 2022-05-22 DIAGNOSIS — M810 Age-related osteoporosis without current pathological fracture: Secondary | ICD-10-CM | POA: Diagnosis not present

## 2022-05-22 DIAGNOSIS — G8929 Other chronic pain: Secondary | ICD-10-CM | POA: Diagnosis not present

## 2022-05-22 DIAGNOSIS — C9000 Multiple myeloma not having achieved remission: Secondary | ICD-10-CM

## 2022-05-22 DIAGNOSIS — I480 Paroxysmal atrial fibrillation: Secondary | ICD-10-CM | POA: Diagnosis not present

## 2022-05-22 DIAGNOSIS — M818 Other osteoporosis without current pathological fracture: Secondary | ICD-10-CM

## 2022-05-22 DIAGNOSIS — D696 Thrombocytopenia, unspecified: Secondary | ICD-10-CM | POA: Diagnosis not present

## 2022-05-22 DIAGNOSIS — D649 Anemia, unspecified: Secondary | ICD-10-CM | POA: Diagnosis not present

## 2022-05-22 MED ORDER — DENOSUMAB 60 MG/ML ~~LOC~~ SOSY
60.0000 mg | PREFILLED_SYRINGE | Freq: Once | SUBCUTANEOUS | Status: AC
  Administered 2022-05-22: 60 mg via SUBCUTANEOUS
  Filled 2022-05-22: qty 1

## 2022-05-29 ENCOUNTER — Ambulatory Visit: Payer: Medicare Other

## 2022-06-03 ENCOUNTER — Other Ambulatory Visit: Payer: Self-pay | Admitting: Hematology

## 2022-06-03 DIAGNOSIS — C9001 Multiple myeloma in remission: Secondary | ICD-10-CM

## 2022-06-07 ENCOUNTER — Encounter: Payer: Self-pay | Admitting: Internal Medicine

## 2022-06-07 ENCOUNTER — Ambulatory Visit (INDEPENDENT_AMBULATORY_CARE_PROVIDER_SITE_OTHER): Payer: Medicare Other | Admitting: Internal Medicine

## 2022-06-07 VITALS — BP 126/82 | HR 49 | Ht 68.0 in | Wt 196.8 lb

## 2022-06-07 DIAGNOSIS — I639 Cerebral infarction, unspecified: Secondary | ICD-10-CM | POA: Diagnosis not present

## 2022-06-07 DIAGNOSIS — D6869 Other thrombophilia: Secondary | ICD-10-CM | POA: Diagnosis not present

## 2022-06-07 DIAGNOSIS — I480 Paroxysmal atrial fibrillation: Secondary | ICD-10-CM

## 2022-06-07 DIAGNOSIS — L237 Allergic contact dermatitis due to plants, except food: Secondary | ICD-10-CM | POA: Diagnosis not present

## 2022-06-07 NOTE — Patient Instructions (Signed)
Medication Instructions:  Your physician recommends that you continue on your current medications as directed. Please refer to the Current Medication list given to you today.  Labwork: None ordered.  Testing/Procedures: None ordered.  Follow-Up: AS NEEDED....    Implantable Loop Recorder Removal, Care After This sheet gives you information about how to care for yourself after your procedure. Your health care provider may also give you more specific instructions. If you have problems or questions, contact your health care provider. What can I expect after the procedure? After the procedure, it is common to have: Soreness or discomfort near the incision. Some swelling or bruising near the incision.  Follow these instructions at home: Incision care  Monitor your cardiac device site for redness, swelling, and drainage. Call the device clinic at [phone] if you experience these symptoms or fever/chills.  Keep the large square bandage on your site for 24 hours and then you may remove it yourself. Keep the steri-strips underneath in place.   You may shower after 72 hours / 3 days from your procedure with the steri-strips in place. They will usually fall off on their own, or may be removed after 10 days. Pat dry.   Avoid lotions, ointments, or perfumes over your incision until it is well-healed.  Please do not submerge in water until your site is completely healed.   If your wound site starts to bleed apply pressure.      If you have any questions/concerns please call the device clinic at [phone].  Activity  Return to your normal activities.  Contact a health care provider if: You have redness, swelling, or pain around your incision. You have a fever.

## 2022-06-07 NOTE — Progress Notes (Signed)
PCP: Mayra Neer, MD Primary Cardiologist: Dr Debara Pickett Primary EP: Dr Rayann Heman  Justin [name] is a 81 y.o. male who presents today for routine electrophysiology followup.  Since last being seen in our clinic, the patient reports doing very well.  Today, he denies symptoms of palpitations, chest pain, shortness of breath,  lower extremity edema, dizziness, presyncope, or syncope.  The patient is otherwise without complaint today.   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 s/p RFCA on 05/01/15   Stroke ([place]) 2017   TIA and mild stroke; denies stroke symptoms since then    Past Surgical History:  Procedure Laterality Date   ATRIAL FIBRILLATION ABLATION     Dr. Lovena Le   BASAL CELL CARCINOMA EXCISION     off of back   ELECTROPHYSIOLOGIC STUDY N/A 05/01/2015   Procedure: SVT Ablation;  Surgeon: Evans Lance, MD;  Location: [facility];  Service: Cardiovascular;  Laterality: N/A;   EP IMPLANTABLE DEVICE N/A 06/04/2016   Procedure: Loop Recorder Insertion;  Surgeon: Thompson Grayer, MD;  Location: [facility];  Service: Cardiovascular;  Laterality: N/A;   INGUINAL HERNIA REPAIR Bilateral 06/16/2017   Procedure: LAPAROSCOPIC BILATERAL INGUINAL HERNIA REPAIR;  Surgeon: Clovis Riley, MD;  Location: [facility];  Service: General;  Laterality: Bilateral;   INSERTION OF MESH Bilateral 06/16/2017   Procedure: INSERTION OF MESH;  Surgeon: Clovis Riley, MD;  Location: [facility];  Service: General;  Laterality: Bilateral;   MASS EXCISION Right 07/28/2018   Procedure: EXCISION OF RIGHT FOREARM MASS;  Surgeon: Clovis Riley, MD;  [facility];  Service: General;  Laterality: Right;   SQUAMOUS CELL CARCINOMA EXCISION     TEE WITHOUT CARDIOVERSION N/A 06/04/2016   Procedure: TRANSESOPHAGEAL ECHOCARDIOGRAM (TEE);  Surgeon: Pixie Casino, MD;  [facility]: [facility];  Service: Cardiovascular;  Laterality: N/A;    ROS- all systems are reviewed and negatives except as per HPI above  Current Outpatient Medications  Medication Sig Dispense Refill   atorvastatin (LIPITOR) 10 MG tablet Take 10 mg by mouth daily. 1/2 tablet daily     cyanocobalamin 1000 MCG tablet Take 1,000 mcg by mouth daily.     denosumab (PROLIA) 60 MG/ML SOSY injection See admin instructions.     denosumab (PROLIA) 60 MG/ML SOSY injection Inject 60 mg into the skin every 6 (six) months.     ergocalciferol (VITAMIN D2) 1.25 MG (50000 UT) capsule Take 1 capsule (50,000 Units total) by mouth once a week. 12 capsule 5   FREESTYLE LITE test strip      glucose monitoring kit (FREESTYLE) monitoring kit 1 each by Does not apply route as needed for other.     Lancets (FREESTYLE) lancets 1 each daily.     lenalidomide (REVLIMID) 5 MG capsule TAKE 1 CAPSULE DAILY FOR 21 DAYS ON, THEN 7 DAYS OFF. REPEAT EVERY 28 DAYS 21 capsule 0   metFORMIN (GLUCOPHAGE) 1000 MG tablet Take 0.5 tablets (500 mg total) by mouth daily with breakfast. 90 tablet 3   nitroGLYCERIN (NITROSTAT)  0.4 MG SL tablet Place 1 tablet (0.4 mg total) under the tongue every 5 (five) minutes as needed for chest pain (Max 3 doses in 15 mins.). 30 tablet 0   omeprazole (PRILOSEC) 20 MG capsule Take 20 mg by mouth every morning.      Potassium 99 MG TABS 1 tablet     pregabalin (LYRICA) 75 MG capsule Take 75 mg by mouth 3 (three) times daily.     XARELTO 20 MG TABS tablet TAKE 1 TABLET DAILY WITH SUPPER 90 tablet 3   No current facility-administered medications for this visit.    Physical Exam: Vitals:   06/07/22 0912  BP: 126/82  Pulse: (!) 49  SpO2: 95%  Weight: 196 lb 12.8 oz (89.3 kg)  Height: 5' 8"   (1.727 m)    GEN- The patient is well appearing, alert and oriented x 3 today.   Head- normocephalic, atraumatic Eyes-  Sclera clear, conjunctiva pink Ears- hearing intact Oropharynx- clear Lungs- Clear to ausculation bilaterally, normal work of breathing Heart- Regular rate and rhythm, no murmurs, rubs or gallops, PMI not laterally displaced GI- soft, NT, ND, + BS Extremities- no clubbing, cyanosis, or edema  Wt Readings from Last 3 Encounters:  06/07/22 196 lb 12.8 oz (89.3 kg)  05/01/22 197 lb 1.6 oz (89.4 kg)  02/07/22 193 lb (87.5 kg)    EKG tracing ordered today is personally reviewed and shows sinus bradycardia  Assessment and Plan:  Paroxysmal atrial fibrillation Well controlled His ILR is at RRT.  He wishes to have this removed. Risks and benefits to ILR removal were discussed with the patient who wishes to proceed.  2. Prior stroke Continue long term anticoagulation  3. CAD Mild  No ischemic symptoms    Thompson Grayer MD, [facility] 06/07/2022 9:19 AM      PROCEDURES:   1. Implantable loop recorder explantation      DESCRIPTION OF PROCEDURE:  Informed written consent was obtained.  The patient required no sedation for the procedure today.   The patients left chest was therefore prepped and draped in the usual sterile fashion.  The skin overlying the ILR monitor was infiltrated with lidocaine for local analgesia.  A 0.5-cm incision was made over the site.  The previously implanted ILR was exposed and removed using a combination of sharp and blunt dissection.  Steri- Strips and a sterile dressing were then applied. EBL<42m.  There were no early apparent complications.     CONCLUSIONS:   1. Successful explantation of a [employer] Reveal LINQ implantable loop recorder   2. No early apparent complications.     Follow-up with Dr HDebara Pickettas scheduled    JThompson GrayerMD, [facility]6/16/2023 9:20 AM

## 2022-06-28 DIAGNOSIS — E7800 Pure hypercholesterolemia, unspecified: Secondary | ICD-10-CM | POA: Diagnosis not present

## 2022-06-28 DIAGNOSIS — G629 Polyneuropathy, unspecified: Secondary | ICD-10-CM | POA: Diagnosis not present

## 2022-06-28 DIAGNOSIS — C9000 Multiple myeloma not having achieved remission: Secondary | ICD-10-CM | POA: Diagnosis not present

## 2022-06-28 DIAGNOSIS — E538 Deficiency of other specified B group vitamins: Secondary | ICD-10-CM | POA: Diagnosis not present

## 2022-06-28 DIAGNOSIS — I4891 Unspecified atrial fibrillation: Secondary | ICD-10-CM | POA: Diagnosis not present

## 2022-06-28 DIAGNOSIS — I10 Essential (primary) hypertension: Secondary | ICD-10-CM | POA: Diagnosis not present

## 2022-06-28 DIAGNOSIS — D6869 Other thrombophilia: Secondary | ICD-10-CM | POA: Diagnosis not present

## 2022-06-28 DIAGNOSIS — M549 Dorsalgia, unspecified: Secondary | ICD-10-CM | POA: Diagnosis not present

## 2022-06-28 DIAGNOSIS — E1169 Type 2 diabetes mellitus with other specified complication: Secondary | ICD-10-CM | POA: Diagnosis not present

## 2022-06-28 DIAGNOSIS — I700 Atherosclerosis of aorta: Secondary | ICD-10-CM | POA: Diagnosis not present

## 2022-07-01 ENCOUNTER — Other Ambulatory Visit: Payer: Self-pay

## 2022-07-01 ENCOUNTER — Telehealth: Payer: Self-pay | Admitting: Hematology

## 2022-07-01 ENCOUNTER — Inpatient Hospital Stay: Payer: Medicare Other | Attending: Hematology

## 2022-07-01 DIAGNOSIS — C9000 Multiple myeloma not having achieved remission: Secondary | ICD-10-CM | POA: Insufficient documentation

## 2022-07-01 LAB — CBC WITH DIFFERENTIAL (CANCER CENTER ONLY)
Abs Immature Granulocytes: 0 10*3/uL (ref 0.00–0.07)
Basophils Absolute: 0.1 10*3/uL (ref 0.0–0.1)
Basophils Relative: 3 %
Eosinophils Absolute: 0.2 10*3/uL (ref 0.0–0.5)
Eosinophils Relative: 8 %
HCT: 37.9 % — ABNORMAL LOW (ref 39.0–52.0)
Hemoglobin: 12.9 g/dL — ABNORMAL LOW (ref 13.0–17.0)
Immature Granulocytes: 0 %
Lymphocytes Relative: 24 %
Lymphs Abs: 0.6 10*3/uL — ABNORMAL LOW (ref 0.7–4.0)
MCH: 30.6 pg (ref 26.0–34.0)
MCHC: 34 g/dL (ref 30.0–36.0)
MCV: 90 fL (ref 80.0–100.0)
Monocytes Absolute: 0.3 10*3/uL (ref 0.1–1.0)
Monocytes Relative: 12 %
Neutro Abs: 1.3 10*3/uL — ABNORMAL LOW (ref 1.7–7.7)
Neutrophils Relative %: 53 %
Platelet Count: 100 10*3/uL — ABNORMAL LOW (ref 150–400)
RBC: 4.21 MIL/uL — ABNORMAL LOW (ref 4.22–5.81)
RDW: 14.6 % (ref 11.5–15.5)
WBC Count: 2.4 10*3/uL — ABNORMAL LOW (ref 4.0–10.5)
nRBC: 0 % (ref 0.0–0.2)

## 2022-07-01 LAB — CMP (CANCER CENTER ONLY)
ALT: 9 U/L (ref 0–44)
AST: 18 U/L (ref 15–41)
Albumin: 4 g/dL (ref 3.5–5.0)
Alkaline Phosphatase: 53 U/L (ref 38–126)
Anion gap: 5 (ref 5–15)
BUN: 21 mg/dL (ref 8–23)
CO2: 27 mmol/L (ref 22–32)
Calcium: 8.8 mg/dL — ABNORMAL LOW (ref 8.9–10.3)
Chloride: 104 mmol/L (ref 98–111)
Creatinine: 1.19 mg/dL (ref 0.61–1.24)
GFR, Estimated: >60 mL/min (ref >60)
Glucose, Bld: 162 mg/dL — ABNORMAL HIGH (ref 70–99)
Potassium: 3.9 mmol/L (ref 3.5–5.1)
Sodium: 136 mmol/L (ref 135–145)
Total Bilirubin: 1.1 mg/dL (ref 0.3–1.2)
Total Protein: 6.6 g/dL (ref 6.5–8.1)

## 2022-07-01 NOTE — Telephone Encounter (Signed)
Left message with rescheduled upcoming appointment due to provider's PAL. 

## 2022-07-02 ENCOUNTER — Other Ambulatory Visit: Payer: Medicare Other

## 2022-07-02 LAB — KAPPA/LAMBDA LIGHT CHAINS
Kappa free light chain: 33.2 mg/L — ABNORMAL HIGH (ref 3.3–19.4)
Kappa, lambda light chain ratio: 1.37 (ref 0.26–1.65)
Lambda free light chains: 24.2 mg/L (ref 5.7–26.3)

## 2022-07-05 LAB — MULTIPLE MYELOMA PANEL, SERUM
Albumin SerPl Elph-Mcnc: 3.7 g/dL (ref 2.9–4.4)
Albumin/Glob SerPl: 1.5 (ref 0.7–1.7)
Alpha 1: 0.2 g/dL (ref 0.0–0.4)
Alpha2 Glob SerPl Elph-Mcnc: 0.6 g/dL (ref 0.4–1.0)
B-Globulin SerPl Elph-Mcnc: 0.9 g/dL (ref 0.7–1.3)
Gamma Glob SerPl Elph-Mcnc: 0.8 g/dL (ref 0.4–1.8)
Globulin, Total: 2.6 g/dL (ref 2.2–3.9)
IgA: 115 mg/dL (ref 61–437)
IgG (Immunoglobin G), Serum: 954 mg/dL (ref 603–1613)
IgM (Immunoglobulin M), Srm: 26 mg/dL (ref 15–143)
M Protein SerPl Elph-Mcnc: 0.1 g/dL — ABNORMAL HIGH
Total Protein ELP: 6.3 g/dL (ref 6.0–8.5)

## 2022-07-09 ENCOUNTER — Ambulatory Visit: Payer: Medicare Other | Admitting: Hematology

## 2022-07-11 ENCOUNTER — Other Ambulatory Visit: Payer: Self-pay | Admitting: Hematology

## 2022-07-11 DIAGNOSIS — C9001 Multiple myeloma in remission: Secondary | ICD-10-CM

## 2022-07-12 ENCOUNTER — Other Ambulatory Visit: Payer: Self-pay

## 2022-07-12 ENCOUNTER — Encounter: Payer: Self-pay | Admitting: Hematology

## 2022-07-25 DIAGNOSIS — I10 Essential (primary) hypertension: Secondary | ICD-10-CM | POA: Diagnosis not present

## 2022-07-25 DIAGNOSIS — I25119 Atherosclerotic heart disease of native coronary artery with unspecified angina pectoris: Secondary | ICD-10-CM | POA: Diagnosis not present

## 2022-07-25 DIAGNOSIS — E7800 Pure hypercholesterolemia, unspecified: Secondary | ICD-10-CM | POA: Diagnosis not present

## 2022-07-25 DIAGNOSIS — E1169 Type 2 diabetes mellitus with other specified complication: Secondary | ICD-10-CM | POA: Diagnosis not present

## 2022-07-25 DIAGNOSIS — M810 Age-related osteoporosis without current pathological fracture: Secondary | ICD-10-CM | POA: Diagnosis not present

## 2022-07-29 ENCOUNTER — Other Ambulatory Visit: Payer: Self-pay

## 2022-07-29 ENCOUNTER — Inpatient Hospital Stay: Payer: Medicare Other | Attending: Hematology | Admitting: Hematology

## 2022-07-29 VITALS — BP 131/71 | HR 50 | Temp 97.9°F | Resp 15 | Wt 197.5 lb

## 2022-07-29 DIAGNOSIS — C9000 Multiple myeloma not having achieved remission: Secondary | ICD-10-CM | POA: Diagnosis not present

## 2022-07-29 DIAGNOSIS — M810 Age-related osteoporosis without current pathological fracture: Secondary | ICD-10-CM

## 2022-07-29 DIAGNOSIS — Z7901 Long term (current) use of anticoagulants: Secondary | ICD-10-CM | POA: Insufficient documentation

## 2022-07-29 DIAGNOSIS — R001 Bradycardia, unspecified: Secondary | ICD-10-CM

## 2022-07-29 DIAGNOSIS — I480 Paroxysmal atrial fibrillation: Secondary | ICD-10-CM | POA: Diagnosis not present

## 2022-07-29 DIAGNOSIS — R252 Cramp and spasm: Secondary | ICD-10-CM

## 2022-07-29 DIAGNOSIS — R42 Dizziness and giddiness: Secondary | ICD-10-CM | POA: Diagnosis not present

## 2022-07-29 NOTE — Progress Notes (Signed)
HEMATOLOGY/ONCOLOGY CLINIC NOTE  Date of Service: .07/29/2022   Patient Care Team: Mayra Neer, MD as PCP - General (Family Medicine) Debara Pickett Nadean Corwin, MD as PCP - Cardiology (Cardiology)   CHIEF COMPLAINTS:  Follow-up for continued evaluation and management of multiple myeloma  HISTORY OF PRESENTING ILLNESS:  Please see previous note for details of initial presentation  CURRENT THERAPY:   Revlimid maintenance 5 mg po 3 weeks on 1 week off.   INTERVAL HISTORY:  Justin [name] is here for continued evaluation and management of multiple myeloma. He notes no acute new symptoms since his last clinic visit.  Still having grade 1 muscle cramps from his low-dose Revlimid. No infection issues since his last clinic visit.  No new focal bone pains. Does feel somewhat unsteady on his feet due to his back issues and radiculopathy. Also notes some intermittent lightheadedness and his heart rate tends to be lower in the low 50s high 40s with normal blood pressure. He was recommended to reach out to his cardiologist to evaluate his bradycardia especially if he is having symptoms.  He was also recommended to drink at least 2 L of water daily. Labs done about a week ago were discussed with him in detail.  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 s/p RFCA on 05/01/15   Stroke ([place]) 2017   TIA and mild stroke; denies stroke symptoms since then     SURGICAL HISTORY: Past Surgical History:  Procedure Laterality Date   ATRIAL FIBRILLATION ABLATION     Dr. Lovena Le    BASAL CELL CARCINOMA EXCISION     off of back   ELECTROPHYSIOLOGIC STUDY N/A 05/01/2015   Procedure: SVT Ablation;  Surgeon: Evans Lance, MD;  Location: [facility];  Service: Cardiovascular;  Laterality: N/A;   EP IMPLANTABLE DEVICE N/A 06/04/2016   Procedure: Loop Recorder Insertion;  Surgeon: Thompson Grayer, MD;  Location: [facility];  Service: Cardiovascular;  Laterality: N/A;   implantable loop recorder removal     ILR removed by Dr Rayann Heman   INGUINAL HERNIA REPAIR Bilateral 06/16/2017   Procedure: LAPAROSCOPIC BILATERAL INGUINAL HERNIA REPAIR;  Surgeon: Clovis Riley, MD;  Location: [facility] [facility];  Service: General;  Laterality: Bilateral;   INSERTION OF MESH Bilateral 06/16/2017   Procedure: INSERTION OF MESH;  Surgeon: Clovis Riley, MD;  Location: [facility];  Service: General;  Laterality: Bilateral;   MASS EXCISION Right 07/28/2018   Procedure: EXCISION OF RIGHT FOREARM MASS;  Surgeon: Clovis Riley, MD;  [facility];  Service: General;  Laterality: Right;   SQUAMOUS CELL CARCINOMA EXCISION     TEE WITHOUT CARDIOVERSION N/A 06/04/2016   Procedure: TRANSESOPHAGEAL ECHOCARDIOGRAM (TEE);  Surgeon: Pixie Casino, MD;  [facility];  Service: Cardiovascular;  Laterality: N/A;    SOCIAL HISTORY: Social History   Socioeconomic History   Marital status: Married    Spouse name: Not on file   Number of children: Not  on file   Years of education: Not on file   Highest education level: Not on file  Occupational History   Not on file  Tobacco Use   Smoking status: Never   Smokeless tobacco: Never  Vaping Use   Vaping Use: Never used  Substance and Sexual Activity   Alcohol use: No   Drug use: No   Sexual activity: Not on file  Other Topics Concern   Not on file  Social History Narrative   Not on file   Social Determinants of Health   Financial Resource Strain: Not on file  Food Insecurity: Not on file  Transportation Needs: Not on file   Physical Activity: Not on file  Stress: Not on file  Social Connections: Not on file  Intimate Partner Violence: Not on file   [name] is retired. His daughter is Mudlogger of nursing for a nursing home in [place], [place].    FAMILY HISTORY: Family History  Problem Relation Age of Onset   Stroke Mother    Hypertension Mother    Alzheimer's disease Father    Heart failure Father    Down syndrome Daughter     ALLERGIES:  is allergic to other and no known allergies.  MEDICATIONS:  Current Outpatient Medications  Medication Sig Dispense Refill   lenalidomide (REVLIMID) 5 MG capsule TAKE 1 CAPSULE DAILY FOR 21 DAYS ON, THEN 7 DAYS OFF. REPEAT EVERY 28 DAYS 21 capsule 0   atorvastatin (LIPITOR) 10 MG tablet Take 10 mg by mouth daily. 1/2 tablet daily     cyanocobalamin 1000 MCG tablet Take 1,000 mcg by mouth daily.     denosumab (PROLIA) 60 MG/ML SOSY injection See admin instructions.     denosumab (PROLIA) 60 MG/ML SOSY injection Inject 60 mg into the skin every 6 (six) months.     ergocalciferol (VITAMIN D2) 1.25 MG (50000 UT) capsule Take 1 capsule (50,000 Units total) by mouth once a week. 12 capsule 5   FREESTYLE LITE test strip      glucose monitoring kit (FREESTYLE) monitoring kit 1 each by Does not apply route as needed for other.     Lancets (FREESTYLE) lancets 1 each daily.     metFORMIN (GLUCOPHAGE) 1000 MG tablet Take 0.5 tablets (500 mg total) by mouth daily with breakfast. 90 tablet 3   nitroGLYCERIN (NITROSTAT) 0.4 MG SL tablet Place 1 tablet (0.4 mg total) under the tongue every 5 (five) minutes as needed for chest pain (Max 3 doses in 15 mins.). 30 tablet 0   omeprazole (PRILOSEC) 20 MG capsule Take 20 mg by mouth every morning.      Potassium 99 MG TABS 1 tablet     pregabalin (LYRICA) 75 MG capsule Take 75 mg by mouth 3 (three) times daily.     XARELTO 20 MG TABS tablet TAKE 1 TABLET DAILY WITH SUPPER 90 tablet 3   No current facility-administered medications for  this visit.    REVIEW OF SYSTEMS:   10 Point review of Systems was done is negative except as noted above. PHYSICAL EXAMINATION:   ECOG PERFORMANCE STATUS: 2 - Symptomatic, <50% confined to bed Vitals:   07/29/22 1116  BP: 131/71  Pulse: (!) 50  Resp: 15  Temp: 97.9 F (36.6 C)  SpO2: 99%     Wt Readings from Last 3 Encounters:  07/29/22 197 lb 8 oz (89.6 kg)  06/07/22 196 lb 12.8 oz (89.3 kg)  05/01/22 197 lb 1.6 oz (89.4 kg)  Body mass index is 30.03 kg/m.  NAD GENERAL:alert, in no acute distress and comfortable SKIN: no acute rashes, no significant lesions EYES: conjunctiva are pink and non-injected, sclera anicteric OROPHARYNX: MMM, no exudates, no oropharyngeal erythema or ulceration NECK: supple, no JVD LYMPH:  no palpable lymphadenopathy in the cervical, axillary or inguinal regions LUNGS: clear to auscultation b/l with normal respiratory effort HEART: regular rate & rhythm ABDOMEN:  normoactive bowel sounds , non tender, not distended. Extremity: no pedal edema PSYCH: alert & oriented x 3 with fluent speech NEURO: no focal motor/sensory deficits  LABORATORY DATA:  I have reviewed the data as listed.    Latest Ref Rng & Units 07/01/2022   10:17 AM 04/25/2022   11:03 AM 01/31/2022   10:39 AM  CBC  WBC 4.0 - 10.5 K/uL 2.4  2.8  2.2   Hemoglobin 13.0 - 17.0 g/dL 12.9  11.3  12.1   Hematocrit 39.0 - 52.0 % 37.9  32.9  36.1   Platelets 150 - 400 K/uL 100  122  123   ANC 1.1k     Latest Ref Rng & Units 07/01/2022   10:17 AM 04/25/2022   11:03 AM 01/31/2022   10:39 AM  CMP  Glucose 70 - 99 mg/dL 162  122  123   BUN 8 - 23 mg/dL _0 Creatinine 0.61 - 1.24 mg/dL 1.19  1.10  1.01   Sodium 135 - 145 mmol/L 136  139  138   Potassium 3.5 - 5.1 mmol/L 3.9  3.6  4.1   Chloride 98 - 111 mmol/L 104  110  108   CO2 22 - 32 mmol/L _1 Calcium 8.9 - 10.3 mg/dL 8.8  7.9  8.7   Total Protein 6.5 - 8.1 g/dL 6.6  5.6  6.1   Total Bilirubin 0.3 - 1.2 mg/dL  1.1  0.8  0.6   Alkaline Phos 38 - 126 U/L 53  51  50   AST 15 - 41 U/L _2 ALT 0 - 44 U/L _3 RADIOGRAPHIC STUDIES: I have personally reviewed the radiological images as listed and agreed with the findings in the report.       Bone Marrow Biopsy 12/24/2016 (Accession [id number])  Diagnosis Bone Marrow, Aspirate,Biopsy, and Clot, left iliac crest - HYPERCELLULAR BONE MARROW FOR AGE WITH PLASMA CELL NEOPLASM. - SEE COMMENT. PERIPHERAL BLOOD: - NORMOCYTIC-NORMOCHROMIC ANEMIA. - LEUKOPENIA.  DG Bone Survey Met (Accession [id number]) (Order [id number])  Imaging  Date: 11/28/2016 Department: [facility] Released By: Pricilla Riffle Authorizing: Brunetta Genera, MD  Exam Information   Status Exam Begun  Exam Ended   Final [99] 11/28/2016 11:59 AM 11/28/2016 12:36 PM  PACS Images   Show images for DG Bone Survey Met  Study Result   CLINICAL DATA:  Monoclonal gammopathy of unknown significance. History of squamous cell carcinoma excision, basal cell carcinoma excision.   EXAM: METASTATIC BONE SURVEY   COMPARISON:  CT neck, chest, abdomen and pelvis from 10/29/16, MRI of the head from 06/01/2016, CXR 10/27/2016, lumbar spine radiographs 06/24/2016   FINDINGS: Lateral skull: Small occipital lucency which may represent a normal arachnoid granulation posteriorly in the occiput. No definite lytic abnormality.   Cervical spine AP and lateral: Disc space narrowing at C2-3, and from C4 through C7. C4-5, C5-6 and C6-7 uncovertebral joint spurring  bilaterally. No lytic abnormality.   Thoracic spine AP and lateral: T8-9 right-sided osteophytes and to a lesser degree T9-10. No lytic abnormality. Slight multilevel lumbar thoracic disc space narrowing likely degenerative.   Lumbar spine AP and lateral: Disc space narrowing at L4-5 and to a greater extent L5-S1. No lytic disease. L3 through S1 facet sclerosis.   AP  pelvis: Negative for lytic disease.   Bilateral upper and lower extremities shoulders through wrist and from the hips through ankle: Negative for lytic disease. Joint space narrowing of the femorotibial compartments both knees. Subchondral cyst of the right patella.   CXR: Clear lungs. Cardiac implantable monitoring device projects over the left heart. Aortic atherosclerosis. No lytic disease.   IMPRESSION: No findings suspicious for lytic disease.     Electronically Signed   By: Ashley Royalty M.D.   On: 11/28/2016 14:58     ASSESSMENT & PLAN:   81 y.o. 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 (12/2016) Peak M spike 2.6  Cytogenetics and Myeloma FISH panel.- trisomy 11 Patient has normocytic anemia without any other clear etiology. (>2g/dl lower that lower limit of normal which is 13) which per criteria would place this in the Multiple myeloma category as opposed to Smoldering Multiple myeloma (Borderline criterion)  No overt renal failure or hypercalcemia at this time No focal bone pains though he has significant chronic back pain related to degenerative disc disease. Bone survey shows no overt lytic lesions.  10/16/18 CT Coronary Morph revealed Coronary artery calcium score 299 Agatston units, this places the patient in the 47th percentile for age and gender, suggesting intermediate risk for future cardiac events. 2. Nonobstructive coronary disease.  12/08/18 Bone Density study revealed that the pt has osteoporosis  2) Anemia and thrombocytopenia -- related to treatment (Revlimid) -stable  3) Grade 1 Neuropathy/radiculopathy-- in b/l feet and halfway up lower leg without pain  4) Borderline low B12 levels, previously 299 --- on replacement -  -continue replacement.  5) h/o recurrent CVA - thought to be related to small vessel disease as per neurology. Has a small PFO which could be an additional  risk factor as well as his afib  6)  P afib Plan -Continue on Xarelto per cardiology.  7) H/o recurrent SCC -Underwent surgical excision on right forearm with Dr. Romana Juniper on 07/28/18. His surgical pathology showed evidence of Squamous Cell Carcinoma. Plan -continue dermatology followup  10) Lower extremity discomfort - improved, stable. Likely Discogenic pain -[place] on 12/2017 revealed no blood clot -Pt has established care with orthopedist and PT.  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   Low back pain 01/30/2018   Lumbar radiculopathy 01/30/2018   History of loop recorder 06/12/2017   Cryptogenic stroke ([place]) 10/28/2016   Gait abnormality    History of CVA (cerebrovascular accident)    History of TIA (transient ischemic attack)    Benign essential HTN    Paroxysmal SVT (supraventricular tachycardia) ([facility])    Acute blood loss anemia    Acute ischemic stroke ([place])    CVA (cerebral vascular accident) ([place]) 10/26/2016   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   History of recent stroke 06/06/2016   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 CBC shows stable mild leukopenia with minimal neutropenia and mild thrombocytopenia of 100k with hemoglobin  of 12.9 CMP stable no hypercalcemia stable kidney function Myeloma panel shows stable M spike of 0.1 g/dL Serum kappa lambda free light chains show normal ratio Patient has no lab or clinical evidence of myeloma progression at this time. He has grade 1-2 muscle cramps which could be from the Revlimid.  He has been taking magnesium and potassium supplementation and is on vitamin D.  The dose of Revlimid has also been cut down to 5 mg 3 weeks on 1 week off.  We discussed cutting down dose further but the patient wants to continue at this time at 5 mg given stable myeloma control. We discussed that we could start as needed muscle relaxant if muscle cramps are very bothersome He was encouraged to stay well-hydrated and drink at least 2 L of water daily He is also having a sense of imbalance and was recommended using a cane. Some intermittent lightheadedness in the context of slower heart rates.  EKG ordered for today-showed sinus bradycardia with no heart blocks.  He was also recommended to follow-up with Dr. Debara Pickett to discuss this in case his dizziness continues. He will continue Prolia 60 mg every 6 months for osteoporosis.  FOLLOW UP: Return to clinic with Dr. Irene Limbo in 10 weeks Labs in 9 weeks PLz schedule next 2 doses of Prolia every 6 months  The total time spent in the appointment was 30 minutes*.  All of the patient's questions were answered with apparent satisfaction. The patient knows to call the clinic with any problems, questions or concerns.   Sullivan Lone MD MS AAHIVMS [facility] [place] Hematology/Oncology Physician [facility]  .*Total Encounter Time as defined by the Centers for Medicare and Medicaid Services includes, in addition to the face-to-face time of a patient visit (documented in the note above) non-face-to-face time: obtaining and reviewing outside history, ordering and reviewing medications, tests or procedures, care coordination (communications with other health care  professionals or caregivers) and documentation in the medical record.

## 2022-08-10 ENCOUNTER — Other Ambulatory Visit: Payer: Self-pay | Admitting: Hematology

## 2022-08-10 DIAGNOSIS — C9001 Multiple myeloma in remission: Secondary | ICD-10-CM

## 2022-08-12 ENCOUNTER — Encounter: Payer: Self-pay | Admitting: Hematology

## 2022-08-12 ENCOUNTER — Other Ambulatory Visit: Payer: Self-pay

## 2022-09-09 ENCOUNTER — Other Ambulatory Visit: Payer: Self-pay | Admitting: Hematology

## 2022-09-09 DIAGNOSIS — K5730 Diverticulosis of large intestine without perforation or abscess without bleeding: Secondary | ICD-10-CM | POA: Diagnosis not present

## 2022-09-09 DIAGNOSIS — E7800 Pure hypercholesterolemia, unspecified: Secondary | ICD-10-CM | POA: Diagnosis not present

## 2022-09-09 DIAGNOSIS — K219 Gastro-esophageal reflux disease without esophagitis: Secondary | ICD-10-CM | POA: Diagnosis not present

## 2022-09-09 DIAGNOSIS — C9001 Multiple myeloma in remission: Secondary | ICD-10-CM

## 2022-09-09 DIAGNOSIS — E1169 Type 2 diabetes mellitus with other specified complication: Secondary | ICD-10-CM | POA: Diagnosis not present

## 2022-09-10 ENCOUNTER — Encounter: Payer: Self-pay | Admitting: Hematology

## 2022-09-10 ENCOUNTER — Other Ambulatory Visit: Payer: Self-pay | Admitting: Family Medicine

## 2022-09-10 ENCOUNTER — Other Ambulatory Visit: Payer: Self-pay

## 2022-09-10 DIAGNOSIS — R4701 Aphasia: Secondary | ICD-10-CM

## 2022-09-16 ENCOUNTER — Ambulatory Visit
Admission: RE | Admit: 2022-09-16 | Discharge: 2022-09-16 | Disposition: A | Discharge status: Home / Self Care | Payer: Medicare Other | Source: Ambulatory Visit | Attending: Family Medicine | Admitting: Family Medicine

## 2022-09-16 DIAGNOSIS — I6381 Other cerebral infarction due to occlusion or stenosis of small artery: Secondary | ICD-10-CM | POA: Diagnosis not present

## 2022-09-16 DIAGNOSIS — R4701 Aphasia: Secondary | ICD-10-CM

## 2022-09-16 MED ORDER — GADOBENATE DIMEGLUMINE 529 MG/ML IV SOLN
18.0000 mL | Freq: Once | INTRAVENOUS | Status: AC | PRN
  Administered 2022-09-16: 18 mL via INTRAVENOUS

## 2022-09-26 ENCOUNTER — Other Ambulatory Visit: Payer: Medicare Other

## 2022-09-30 DIAGNOSIS — R413 Other amnesia: Secondary | ICD-10-CM | POA: Diagnosis not present

## 2022-09-30 DIAGNOSIS — D0439 Carcinoma in situ of skin of other parts of face: Secondary | ICD-10-CM | POA: Diagnosis not present

## 2022-09-30 DIAGNOSIS — L821 Other seborrheic keratosis: Secondary | ICD-10-CM | POA: Diagnosis not present

## 2022-09-30 DIAGNOSIS — Z86018 Personal history of other benign neoplasm: Secondary | ICD-10-CM | POA: Diagnosis not present

## 2022-09-30 DIAGNOSIS — R29898 Other symptoms and signs involving the musculoskeletal system: Secondary | ICD-10-CM | POA: Diagnosis not present

## 2022-09-30 DIAGNOSIS — L814 Other melanin hyperpigmentation: Secondary | ICD-10-CM | POA: Diagnosis not present

## 2022-09-30 DIAGNOSIS — D485 Neoplasm of uncertain behavior of skin: Secondary | ICD-10-CM | POA: Diagnosis not present

## 2022-09-30 DIAGNOSIS — L570 Actinic keratosis: Secondary | ICD-10-CM | POA: Diagnosis not present

## 2022-09-30 DIAGNOSIS — Z85828 Personal history of other malignant neoplasm of skin: Secondary | ICD-10-CM | POA: Diagnosis not present

## 2022-09-30 DIAGNOSIS — L578 Other skin changes due to chronic exposure to nonionizing radiation: Secondary | ICD-10-CM | POA: Diagnosis not present

## 2022-09-30 DIAGNOSIS — D225 Melanocytic nevi of trunk: Secondary | ICD-10-CM | POA: Diagnosis not present

## 2022-09-30 DIAGNOSIS — C44629 Squamous cell carcinoma of skin of left upper limb, including shoulder: Secondary | ICD-10-CM | POA: Diagnosis not present

## 2022-09-30 DIAGNOSIS — D044 Carcinoma in situ of skin of scalp and neck: Secondary | ICD-10-CM | POA: Diagnosis not present

## 2022-10-02 ENCOUNTER — Ambulatory Visit (INDEPENDENT_AMBULATORY_CARE_PROVIDER_SITE_OTHER): Payer: Medicare Other | Admitting: Physician Assistant

## 2022-10-02 ENCOUNTER — Other Ambulatory Visit: Payer: Self-pay

## 2022-10-02 ENCOUNTER — Other Ambulatory Visit: Payer: Medicare Other

## 2022-10-02 ENCOUNTER — Encounter: Payer: Self-pay | Admitting: Physician Assistant

## 2022-10-02 VITALS — BP 122/62 | HR 56 | Resp 20 | Ht 68.0 in | Wt 198.0 lb

## 2022-10-02 DIAGNOSIS — G3184 Mild cognitive impairment, so stated: Secondary | ICD-10-CM | POA: Diagnosis not present

## 2022-10-02 DIAGNOSIS — R200 Anesthesia of skin: Secondary | ICD-10-CM

## 2022-10-02 DIAGNOSIS — C9000 Multiple myeloma not having achieved remission: Secondary | ICD-10-CM

## 2022-10-02 DIAGNOSIS — R413 Other amnesia: Secondary | ICD-10-CM

## 2022-10-02 DIAGNOSIS — I639 Cerebral infarction, unspecified: Secondary | ICD-10-CM | POA: Diagnosis not present

## 2022-10-02 LAB — FOLATE: Folate: >23.9 ng/mL (ref >5.9)

## 2022-10-02 LAB — VITAMIN B12: Vitamin B-12: 344 pg/mL (ref 211–911)

## 2022-10-02 LAB — FERRITIN: Ferritin: 89.1 ng/mL (ref 22.0–322.0)

## 2022-10-02 LAB — TSH: TSH: 1.1 u[IU]/mL (ref 0.35–5.50)

## 2022-10-02 NOTE — Patient Instructions (Addendum)
It was a pleasure to see you today at our office.   Recommendations:  Neurocognitive evaluation at our office Check labs today Folllow up in 3 months     Increase activity, agree with PT/OT  Consider discussing with PCP the use of omeprazole.  Consider mustard, CoQ 10 for neuropathy   Whom to call:  Memory  decline, memory medications: Call our office [phone]   For psychiatric meds, mood meds: Please have your primary care physician manage these medications.   Counseling regarding caregiver distress, including caregiver depression, anxiety and issues regarding community resources, adult day care programs, adult living facilities, or memory care questions:   Feel free to contact [employer], Social Worker at [phone]   For assessment of decision of mental capacity and competency:  Call Dr. Anthoney Harada, geriatric psychiatrist at [phone]  For guidance in geriatric dementia issues please call [facility] [phone]  For guidance regarding [facility] and if placement were needed at the facility, contact Arnell Asal, Social Worker tel: [phone]  If you have any severe symptoms of a stroke, or other severe issues such as confusion,severe chills or fever, etc call 911 or go to the ER as you may need to be evaluated further      RECOMMENDATIONS FOR ALL PATIENTS WITH MEMORY PROBLEMS: 1. Continue to exercise (Recommend 30 minutes of walking everyday, or 3 hours every week) 2. Increase social interactions - continue going to [place] and enjoy social gatherings with friends and family 3. Eat healthy, avoid fried foods and eat more fruits and vegetables 4. Maintain adequate blood pressure, blood sugar, and blood cholesterol level. Reducing the risk of stroke and cardiovascular disease also helps promoting better memory. 5. Avoid stressful situations. Live a simple life and avoid aggravations. Organize your time and prepare for  the next day in anticipation. 6. Sleep well, avoid any interruptions of sleep and avoid any distractions in the bedroom that may interfere with adequate sleep quality 7. Avoid sugar, avoid sweets as there is a strong link between excessive sugar intake, diabetes, and cognitive impairment We discussed the Mediterranean diet, which has been shown to help patients reduce the risk of progressive memory disorders and reduces cardiovascular risk. This includes eating fish, eat fruits and green leafy vegetables, nuts like almonds and hazelnuts, walnuts, and also use olive oil. Avoid fast foods and fried foods as much as possible. Avoid sweets and sugar as sugar use has been linked to worsening of memory function.  There is always a concern of gradual progression of memory problems. If this is the case, then we may need to adjust level of care according to patient needs. Support, both to the patient and caregiver, should then be put into place.      You have been referred for a neuropsychological evaluation (i.e., evaluation of memory and thinking abilities). Please bring someone with you to this appointment if possible, as it is helpful for the doctor to hear from both you and another adult who knows you well. Please bring eyeglasses and hearing aids if you wear them.    The evaluation will take approximately 3 hours and has two parts:   The first part is a clinical interview with the neuropsychologist (Dr. Melvyn Novas or Dr. Nicole Kindred). During the interview, the neuropsychologist will speak with you and the individual you brought to the appointment.    The second part of the evaluation is testing with the doctor's technician Hinton Dyer or Maudie Mercury). During the testing,  the technician will ask you to remember different types of material, solve problems, and answer some questionnaires. Your family member will not be present for this portion of the evaluation.   Please note: We must reserve several hours of the  neuropsychologist's time and the psychometrician's time for your evaluation appointment. As such, there is a No-Show fee of $100. If you are unable to attend any of your appointments, please contact our office as soon as possible to reschedule.    FALL PRECAUTIONS: Be cautious when walking. Scan the area for obstacles that may increase the risk of trips and falls. When getting up in the mornings, sit up at the edge of the bed for a few minutes before getting out of bed. Consider elevating the bed at the head end to avoid drop of blood pressure when getting up. Walk always in a well-lit room (use night lights in the walls). Avoid area rugs or power cords from appliances in the middle of the walkways. Use a walker or a cane if necessary and consider physical therapy for balance exercise. Get your eyesight checked regularly.  FINANCIAL OVERSIGHT: Supervision, especially oversight when making financial decisions or transactions is also recommended.  HOME SAFETY: Consider the safety of the kitchen when operating appliances like stoves, microwave oven, and blender. Consider having supervision and share cooking responsibilities until no longer able to participate in those. Accidents with firearms and other hazards in the house should be identified and addressed as well.   ABILITY TO BE LEFT ALONE: If patient is unable to contact 911 operator, consider using LifeLine, or when the need is there, arrange for someone to stay with patients. Smoking is a fire hazard, consider supervision or cessation. Risk of wandering should be assessed by caregiver and if detected at any point, supervision and safe proof recommendations should be instituted.  MEDICATION SUPERVISION: Inability to self-administer medication needs to be constantly addressed. Implement a mechanism to ensure safe administration of the medications.   DRIVING: Regarding driving, in patients with progressive memory problems, driving will be impaired. We  advise to have someone else do the driving if trouble finding directions or if minor accidents are reported. Independent driving assessment is available to determine safety of driving.   If you are interested in the driving assessment, you can contact the following:  [employer] in [place]  [place] [phone] or [phone]    [place] refers to food and lifestyle choices that are based on the traditions of countries located on the [employer]. This way of eating has been shown to help prevent certain conditions and improve outcomes for people who have chronic diseases, like kidney disease and heart disease. What are tips for following this plan? Lifestyle  Cook and eat meals together with your family, when possible. Drink enough fluid to keep your urine clear or pale yellow. Be physically active every day. This includes: Aerobic exercise like running or swimming. Leisure activities like gardening, walking, or housework. Get 7-8 hours of sleep each night. If recommended by your health care provider, drink red wine in moderation. This means 1 glass a day for nonpregnant women and 2 glasses a day for men. A glass of wine equals 5 oz (150 mL). Reading food labels  Check the serving size of packaged foods. For foods such as rice and pasta, the serving size refers to the amount of cooked product, not dry.  Check the total fat in packaged foods. Avoid foods that have saturated fat or trans fats. Check the ingredients list for added sugars, such as corn syrup. Shopping  At the grocery store, buy most of your food from the areas near the walls of the store. This includes: Fresh fruits and vegetables (produce). Grains, beans, nuts, and seeds. Some of these may be available in unpackaged forms or large amounts (in bulk). Fresh  seafood. Poultry and eggs. Low-fat dairy products. Buy whole ingredients instead of prepackaged foods. Buy fresh fruits and vegetables in-season from local farmers markets. Buy frozen fruits and vegetables in resealable bags. If you do not have access to quality fresh seafood, buy precooked frozen shrimp or canned fish, such as tuna, salmon, or sardines. Buy small amounts of raw or cooked vegetables, salads, or olives from the deli or salad bar at your store. Stock your pantry so you always have certain foods on hand, such as olive oil, canned tuna, canned tomatoes, rice, pasta, and beans. Cooking  Cook foods with extra-virgin olive oil instead of using butter or other vegetable oils. Have meat as a side dish, and have vegetables or grains as your main dish. This means having meat in small portions or adding small amounts of meat to foods like pasta or stew. Use beans or vegetables instead of meat in common dishes like chili or lasagna. Experiment with different cooking methods. Try roasting or broiling vegetables instead of steaming or sauteing them. Add frozen vegetables to soups, stews, pasta, or rice. Add nuts or seeds for added healthy fat at each meal. You can add these to yogurt, salads, or vegetable dishes. Marinate fish or vegetables using olive oil, lemon juice, garlic, and fresh herbs. Meal planning  Plan to eat 1 vegetarian meal one day each week. Try to work up to 2 vegetarian meals, if possible. Eat seafood 2 or more times a week. Have healthy snacks readily available, such as: Vegetable sticks with hummus. Greek yogurt. Fruit and nut trail mix. Eat balanced meals throughout the week. This includes: Fruit: 2-3 servings a day Vegetables: 4-5 servings a day Low-fat dairy: 2 servings a day Fish, poultry, or lean meat: 1 serving a day Beans and legumes: 2 or more servings a week Nuts and seeds: 1-2 servings a day Whole grains: 6-8 servings a day Extra-virgin olive oil: 3-4  servings a day Limit red meat and sweets to only a few servings a month What are my food choices? Mediterranean diet Recommended Grains: Whole-grain pasta. Brown rice. Bulgar wheat. Polenta. Couscous. Whole-wheat bread. [place]. Vegetables: Artichokes. Beets. Broccoli. Cabbage. Carrots. Eggplant. Green beans. Chard. Kale. Spinach. Onions. Leeks. Peas. Squash. Tomatoes. Peppers. Radishes. Fruits: Apples. Apricots. Avocado. Berries. Bananas. Cherries. Dates. Figs. Grapes. Lemons. Melon. Oranges. Peaches. Plums. Pomegranate. Meats and other protein foods: Beans. Almonds. Sunflower seeds. Pine nuts. Peanuts. [place]. Salmon. Scallops. Shrimp. [place]. Tilapia. Clams. Oysters. Eggs. Dairy: Low-fat milk. Cheese. Greek yogurt. Beverages: Water. Red wine. Herbal tea. Fats and oils: Extra virgin olive oil. Avocado oil. Grape seed oil. Sweets and desserts: [place] yogurt with honey. Baked apples. Poached pears. Trail mix. Seasoning and other foods: Basil. Cilantro. Coriander. Cumin. Mint. Parsley. Sage. Rosemary. Tarragon. Garlic. Oregano. Thyme. Pepper. Balsalmic vinegar. Tahini. Hummus. Tomato sauce. Olives. Mushrooms. Limit these Grains: Prepackaged pasta or rice dishes. Prepackaged cereal with added sugar. Vegetables: Deep fried potatoes (french fries). Fruits: Fruit canned in syrup. Meats and other protein foods: Beef. Pork. Lamb. Poultry with skin. Hot dogs. Berniece Salines. Dairy: Ice cream.  Sour cream. Whole milk. Beverages: Juice. Sugar-sweetened soft drinks. Beer. Liquor and spirits. Fats and oils: Butter. Canola oil. Vegetable oil. Beef fat (tallow). Lard. Sweets and desserts: Cookies. Cakes. Pies. Candy. Seasoning and other foods: Mayonnaise. Premade sauces and marinades. The items listed may not be a complete list. Talk with your dietitian about what dietary choices are right for you. Summary The Mediterranean diet includes both food and lifestyle choices. Eat a variety of fresh fruits and  vegetables, beans, nuts, seeds, and whole grains. Limit the amount of red meat and sweets that you eat. Talk with your health care provider about whether it is safe for you to drink red wine in moderation. This means 1 glass a day for nonpregnant women and 2 glasses a day for men. A glass of wine equals 5 oz (150 mL). This information is not intended to replace advice given to you by your health care provider. Make sure you discuss any questions you have with your health care provider. Document Released: 08/01/2016 Document Revised: 09/03/2016 Document Reviewed: 08/01/2016 Elsevier Interactive Patient Education  2017 [place] provider has requested that you have labwork completed today. Please go to [facility] ([id number]) on the second floor of this building before leaving the office today. You do not need to check in. If you are not called within 15 minutes please check with the front desk.

## 2022-10-02 NOTE — Progress Notes (Addendum)
Assessment/Plan:    The patient is seen in neurologic consultation at the request of Mayra Neer, MD for the evaluation of memory.  Justin [name] is a very pleasant 81 y.o. year old RH male with  a history of hypertension, DM2,  hyperlipidemia, PAF, history of CVA 2017 Multiple Myeloma on Revlimid seen today for evaluation of memory loss.  Recent MRI personally reviewed (09/17/2022) is without acute intracranial abnormalities, and is remarkable for chronic microvascular ischemic disease, small remote lacunar infarct in the right frontal white matter without pathologic enhancement, essentially normal volume for age.  MoCA today is 26/30.  Work-up is in progress, will hold starting dementia medication at least until the next visit in 3 months, in view of the current score and performance.  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  Neuropsych testing for clarity of diagnosis and disease trajectory    Subjective:    The patient is here alone    How long did patient have memory difficulties?  For the last year, worse over the 2 months have more difficulty with names of known people including daughter and grand-daughter, missing appointments, and sometimes he forgets in the middle of the conversation when he was talking about.  He reports that he is STM is worse than LTM. repeats oneself? Endorsed  Disoriented when walking into a room?  Patient denies   Leaving objects in unusual places?  Patient denies   Patient lives with wife and her son  Ambulates  with difficulty?   Feels a little unsteady due to a history of peripheral neuropathy due to diabetes, not recently fell on the "rear end" without LOC or head injury. Set to participate in PT/OT.  Otherwise, he walks daily and works on the yard.  History of seizures?   Patient denies    Wandering behavior?  Patient denies   Patient drives?   No issues  Any mood changes ?  "Sometimes I get upset with myself because I can't remember something and can be frustrating ".  Any depression?:  Patient denies   Hallucinations?  Patient denies   Paranoia?  Patient denies   Patient reports that sleeps well without vivid dreams, REM behavior or sleepwalking .   History of sleep apnea?  Patient denies   Any hygiene concerns?  Patient denies   Independent of bathing and dressing?  Endorsed  Does the patient needs help with medications?   Patient in charge  Who is in charge of the finances? Patient  is in charge   Any changes in appetite?  Patient denies   Patient have trouble swallowing? Patient denies   Does the patient cook?  Patient denies   Any kitchen accidents such as leaving the stove on? Patient denies   Any headaches?  Patient denies   Double vision? Patient denies   Any focal numbness or tingling?  Has bilateral diabetic neuropathy and spinal stenosis, he is on Lyrica tolerating well.  Chronic back pain?  Patient denies   Unilateral weakness?  Patient denies   Any tremors?  Patient denies   Any history of anosmia?  Patient denies   Any incontinence of urine?  Wears pads "just in case because I may dribble" Any bowel dysfunction?   Intermittent diarrhea or constipation due to Revlimid  History of heavy  alcohol intake?  Patient denies   History of heavy tobacco use?  Patient denies   Family history of dementia?   Sister died with LBD     Allergies  Allergen Reactions   Other Other (See Comments)   No Known Allergies     Current Outpatient Medications  Medication Instructions   atorvastatin (LIPITOR) 40 MG tablet No dose, route, or frequency recorded.   atorvastatin (LIPITOR) 10 mg, Oral, Daily, 1/2 tablet daily   cyanocobalamin 1,000 mcg, Oral, Daily   denosumab (PROLIA) 60 MG/ML SOSY injection See admin instructions   denosumab (PROLIA) 60 mg, Subcutaneous,  Every 6 months   ergocalciferol (VITAMIN D2) 50,000 Units, Oral, Weekly   FREESTYLE LITE test strip No dose, route, or frequency recorded.   glucose monitoring kit (FREESTYLE) monitoring kit 1 each, Does not apply, As needed   Lancets (FREESTYLE) lancets 1 each, Daily   lenalidomide (REVLIMID) 5 MG capsule TAKE 1 CAPSULE DAILY FOR 21 DAYS ON THEN 7 DAYS OFF. REPEAT EVERY 28 DAYS.   metFORMIN (GLUCOPHAGE) 500 mg, Oral, Daily with breakfast   nitroGLYCERIN (NITROSTAT) 0.4 mg, Sublingual, Every 5 min PRN   omeprazole (PRILOSEC) 20 mg, Oral, BH-each morning   Potassium 99 MG TABS 1 tablet   pregabalin (LYRICA) 75 mg, Oral, 3 times daily   XARELTO 20 MG TABS tablet TAKE 1 TABLET DAILY WITH SUPPER     VITALS:   Vitals:   10/02/22 0757  BP: 122/62  Pulse: (!) 56  Resp: 20  SpO2: 96%  Weight: 198 lb (89.8 kg)  Height: 5' 8"  (1.727 m)      12/17/2016    3:21 PM 11/05/2016    5:09 PM  Depression screen PHQ 2/9  Decreased Interest 0 0  Down, Depressed, Hopeless 0 0  PHQ - 2 Score 0 0    PHYSICAL EXAM   HEENT:  Normocephalic, atraumatic. The mucous membranes are moist. The superficial temporal arteries are without ropiness or tenderness. Cardiovascular: Regular rate and rhythm. Lungs: Clear to auscultation bilaterally. Neck: There are no carotid bruits noted bilaterally.  NEUROLOGICAL:     No data to display              No data to display           Orientation:  Alert and oriented to person, place and time. No aphasia or dysarthria. Fund of knowledge is appropriate. Recent memory impaired and remote memory intact.  Attention and concentration are normal.  Able to name objects and repeat phrases. Delayed recall  2/5 Cranial nerves: There is good facial symmetry. Extraocular muscles are intact and visual fields are full to confrontational testing. Speech is fluent and clear. Soft palate rises symmetrically and there is no tongue deviation. Hearing is intact to  conversational tone. Tone: Tone is good throughout. Sensation: Sensation is intact to light touch and pinprick throughout except in B feet. Vibration is intact at the bilateral big toe.There is no extinction with double simultaneous stimulation. There is no sensory dermatomal level identified. Coordination: The patient has no difficulty with RAM's or FNF bilaterally. Normal finger to nose  Motor: Strength is 5/5 in the bilateral upper and lower extremities. There is no pronator drift. There are no fasciculations noted. DTR's: Deep tendon reflexes are 2/4 at the bilateral biceps, triceps, brachioradialis, patella and  1/4 achilles.  Plantar responses are downgoing bilaterally. Gait and Station: The patient is able to stand up from chair without difficulty.  The patient is able to  heel toe walk without some difficulty due to neuropathy, able to ambulate in a tandem fashion, able to stand in the Romberg position.     Thank you for allowing [place] the opportunity to participate in the care of this nice patient. Please do not hesitate to contact us for any questions or concerns.   Total time spent on today's visit was 60  minutes dedicated to this patient today, preparing to see patient, examining the patient, ordering tests and/or medications and counseling the patient, documenting clinical information in the EHR or other health record, independently interpreting results and communicating results to the patient/family, discussing treatment and goals, answering patient's questions and coordinating care.  Cc:  Mayra Neer, MD  Sharene Butters 10/03/2022 7:40 AM

## 2022-10-03 DIAGNOSIS — G3184 Mild cognitive impairment, so stated: Secondary | ICD-10-CM | POA: Insufficient documentation

## 2022-10-03 HISTORY — DX: Mild cognitive impairment of uncertain or unknown etiology: G31.84

## 2022-10-03 LAB — IRON AND TIBC
Iron Saturation: 27 % (ref 15–55)
Iron: 88 ug/dL (ref 38–169)
Total Iron Binding Capacity: 326 ug/dL (ref 250–450)
UIBC: 238 ug/dL (ref 111–343)

## 2022-10-03 NOTE — Progress Notes (Signed)
B12 on the lower side, recommend 1000 mcg daily and follow with PCP thanks

## 2022-10-04 ENCOUNTER — Other Ambulatory Visit: Payer: Self-pay

## 2022-10-04 DIAGNOSIS — C9001 Multiple myeloma in remission: Secondary | ICD-10-CM

## 2022-10-04 MED ORDER — LENALIDOMIDE 5 MG PO CAPS: ORAL_CAPSULE | ORAL | 0 refills | Status: DC

## 2022-10-09 DIAGNOSIS — M79605 Pain in left leg: Secondary | ICD-10-CM | POA: Diagnosis not present

## 2022-10-09 DIAGNOSIS — M79604 Pain in right leg: Secondary | ICD-10-CM | POA: Diagnosis not present

## 2022-10-09 DIAGNOSIS — R269 Unspecified abnormalities of gait and mobility: Secondary | ICD-10-CM | POA: Diagnosis not present

## 2022-10-09 DIAGNOSIS — R531 Weakness: Secondary | ICD-10-CM | POA: Diagnosis not present

## 2022-10-10 ENCOUNTER — Telehealth: Payer: Self-pay | Admitting: Physician Assistant

## 2022-10-10 NOTE — Telephone Encounter (Signed)
Patient advised to contact PCP, thanked me for calling.

## 2022-10-10 NOTE — Telephone Encounter (Signed)
Patient called and left a VM. He said he stopped omeprazole for GERD but his symptoms came back. He'd like to know what to do.

## 2022-10-15 DIAGNOSIS — D0462 Carcinoma in situ of skin of left upper limb, including shoulder: Secondary | ICD-10-CM | POA: Diagnosis not present

## 2022-10-15 DIAGNOSIS — C44629 Squamous cell carcinoma of skin of left upper limb, including shoulder: Secondary | ICD-10-CM | POA: Diagnosis not present

## 2022-10-15 DIAGNOSIS — L570 Actinic keratosis: Secondary | ICD-10-CM | POA: Diagnosis not present

## 2022-10-16 DIAGNOSIS — R269 Unspecified abnormalities of gait and mobility: Secondary | ICD-10-CM | POA: Diagnosis not present

## 2022-10-16 DIAGNOSIS — R531 Weakness: Secondary | ICD-10-CM | POA: Diagnosis not present

## 2022-10-16 DIAGNOSIS — M79605 Pain in left leg: Secondary | ICD-10-CM | POA: Diagnosis not present

## 2022-10-16 DIAGNOSIS — M79604 Pain in right leg: Secondary | ICD-10-CM | POA: Diagnosis not present

## 2022-10-17 ENCOUNTER — Other Ambulatory Visit: Payer: Self-pay | Admitting: Internal Medicine

## 2022-10-17 MED ORDER — ATORVASTATIN CALCIUM 10 MG PO TABS: 10.0000 mg | ORAL_TABLET | Freq: Every day | ORAL | 0 refills | Status: DC

## 2022-10-17 NOTE — Telephone Encounter (Signed)
Patient walked in the office requesting a refill of atorvastatin. His med list listed '10mg'$  tabs (take 1/2 QD) and '40mg'$ . Last refill by Dr. Debara Pickett was >3 years ago.   Spoke with patient about this. Explained that we have no Rx'ed in years. His pill bottle indicated Dr. Oneida Arenas refilled this med in May 2023 and he does not know who this is. He was also adamant that his cardiologist needed to fill this as it is a heart med. Explained that a PCP can refill/prescribe also. Offered to send in Rx for him - he stated to send to [facility] as he has been having issues with [facility]. He was also informed his next visit is due Jan 2024, which was scheduled before he left today.

## 2022-10-20 DIAGNOSIS — R051 Acute cough: Secondary | ICD-10-CM | POA: Diagnosis not present

## 2022-10-20 DIAGNOSIS — S61212A Laceration without foreign body of right middle finger without damage to nail, initial encounter: Secondary | ICD-10-CM | POA: Diagnosis not present

## 2022-10-20 DIAGNOSIS — U071 COVID-19: Secondary | ICD-10-CM | POA: Diagnosis not present

## 2022-10-29 DIAGNOSIS — M79604 Pain in right leg: Secondary | ICD-10-CM | POA: Diagnosis not present

## 2022-10-29 DIAGNOSIS — R269 Unspecified abnormalities of gait and mobility: Secondary | ICD-10-CM | POA: Diagnosis not present

## 2022-10-29 DIAGNOSIS — M79605 Pain in left leg: Secondary | ICD-10-CM | POA: Diagnosis not present

## 2022-10-29 DIAGNOSIS — R531 Weakness: Secondary | ICD-10-CM | POA: Diagnosis not present

## 2022-11-04 ENCOUNTER — Other Ambulatory Visit: Payer: Self-pay

## 2022-11-04 DIAGNOSIS — C9001 Multiple myeloma in remission: Secondary | ICD-10-CM

## 2022-11-04 MED ORDER — LENALIDOMIDE 5 MG PO CAPS: ORAL_CAPSULE | ORAL | 0 refills | Status: DC

## 2022-11-05 ENCOUNTER — Other Ambulatory Visit: Payer: Self-pay | Admitting: Hematology

## 2022-11-05 ENCOUNTER — Other Ambulatory Visit: Payer: Self-pay | Admitting: Internal Medicine

## 2022-11-05 DIAGNOSIS — C9001 Multiple myeloma in remission: Secondary | ICD-10-CM

## 2022-11-06 ENCOUNTER — Other Ambulatory Visit: Payer: Self-pay

## 2022-11-06 ENCOUNTER — Encounter: Payer: Self-pay | Admitting: Hematology

## 2022-11-06 DIAGNOSIS — R531 Weakness: Secondary | ICD-10-CM | POA: Diagnosis not present

## 2022-11-06 DIAGNOSIS — M79605 Pain in left leg: Secondary | ICD-10-CM | POA: Diagnosis not present

## 2022-11-06 DIAGNOSIS — M79604 Pain in right leg: Secondary | ICD-10-CM | POA: Diagnosis not present

## 2022-11-06 DIAGNOSIS — R269 Unspecified abnormalities of gait and mobility: Secondary | ICD-10-CM | POA: Diagnosis not present

## 2022-11-07 DIAGNOSIS — C44629 Squamous cell carcinoma of skin of left upper limb, including shoulder: Secondary | ICD-10-CM | POA: Diagnosis not present

## 2022-11-07 DIAGNOSIS — R413 Other amnesia: Secondary | ICD-10-CM | POA: Diagnosis not present

## 2022-11-07 DIAGNOSIS — Z8589 Personal history of malignant neoplasm of other organs and systems: Secondary | ICD-10-CM | POA: Diagnosis not present

## 2022-11-07 DIAGNOSIS — K862 Cyst of pancreas: Secondary | ICD-10-CM | POA: Diagnosis not present

## 2022-11-07 DIAGNOSIS — Z5189 Encounter for other specified aftercare: Secondary | ICD-10-CM | POA: Diagnosis not present

## 2022-11-07 DIAGNOSIS — U071 COVID-19: Secondary | ICD-10-CM | POA: Diagnosis not present

## 2022-11-07 DIAGNOSIS — K219 Gastro-esophageal reflux disease without esophagitis: Secondary | ICD-10-CM | POA: Diagnosis not present

## 2022-11-07 DIAGNOSIS — I25119 Atherosclerotic heart disease of native coronary artery with unspecified angina pectoris: Secondary | ICD-10-CM | POA: Diagnosis not present

## 2022-11-07 DIAGNOSIS — L905 Scar conditions and fibrosis of skin: Secondary | ICD-10-CM | POA: Diagnosis not present

## 2022-11-11 ENCOUNTER — Other Ambulatory Visit: Payer: Self-pay | Admitting: Family Medicine

## 2022-11-11 DIAGNOSIS — K862 Cyst of pancreas: Secondary | ICD-10-CM

## 2022-11-13 ENCOUNTER — Other Ambulatory Visit: Payer: Self-pay

## 2022-11-13 ENCOUNTER — Inpatient Hospital Stay: Payer: Medicare Other | Attending: Hematology

## 2022-11-13 DIAGNOSIS — C9000 Multiple myeloma not having achieved remission: Secondary | ICD-10-CM | POA: Insufficient documentation

## 2022-11-13 LAB — CBC WITH DIFFERENTIAL (CANCER CENTER ONLY)
Abs Immature Granulocytes: 0 10*3/uL (ref 0.00–0.07)
Basophils Absolute: 0.1 10*3/uL (ref 0.0–0.1)
Basophils Relative: 3 %
Eosinophils Absolute: 0.1 10*3/uL (ref 0.0–0.5)
Eosinophils Relative: 5 %
HCT: 36.1 % — ABNORMAL LOW (ref 39.0–52.0)
Hemoglobin: 12.1 g/dL — ABNORMAL LOW (ref 13.0–17.0)
Immature Granulocytes: 0 %
Lymphocytes Relative: 31 %
Lymphs Abs: 0.8 10*3/uL (ref 0.7–4.0)
MCH: 30.3 pg (ref 26.0–34.0)
MCHC: 33.5 g/dL (ref 30.0–36.0)
MCV: 90.3 fL (ref 80.0–100.0)
Monocytes Absolute: 0.4 10*3/uL (ref 0.1–1.0)
Monocytes Relative: 14 %
Neutro Abs: 1.2 10*3/uL — ABNORMAL LOW (ref 1.7–7.7)
Neutrophils Relative %: 47 %
Platelet Count: 110 10*3/uL — ABNORMAL LOW (ref 150–400)
RBC: 4 MIL/uL — ABNORMAL LOW (ref 4.22–5.81)
RDW: 15 % (ref 11.5–15.5)
WBC Count: 2.6 10*3/uL — ABNORMAL LOW (ref 4.0–10.5)
nRBC: 0 % (ref 0.0–0.2)

## 2022-11-13 LAB — CMP (CANCER CENTER ONLY)
ALT: 12 U/L (ref 0–44)
AST: 20 U/L (ref 15–41)
Albumin: 3.5 g/dL (ref 3.5–5.0)
Alkaline Phosphatase: 47 U/L (ref 38–126)
Anion gap: 6 (ref 5–15)
BUN: 19 mg/dL (ref 8–23)
CO2: 25 mmol/L (ref 22–32)
Calcium: 8.8 mg/dL — ABNORMAL LOW (ref 8.9–10.3)
Chloride: 108 mmol/L (ref 98–111)
Creatinine: 1.16 mg/dL (ref 0.61–1.24)
GFR, Estimated: >60 mL/min (ref >60)
Glucose, Bld: 131 mg/dL — ABNORMAL HIGH (ref 70–99)
Potassium: 4.1 mmol/L (ref 3.5–5.1)
Sodium: 139 mmol/L (ref 135–145)
Total Bilirubin: 1 mg/dL (ref 0.3–1.2)
Total Protein: 6.6 g/dL (ref 6.5–8.1)

## 2022-11-13 LAB — VITAMIN B12: Vitamin B-12: 273 pg/mL (ref 180–914)

## 2022-11-18 DIAGNOSIS — R531 Weakness: Secondary | ICD-10-CM | POA: Diagnosis not present

## 2022-11-18 DIAGNOSIS — M79604 Pain in right leg: Secondary | ICD-10-CM | POA: Diagnosis not present

## 2022-11-18 DIAGNOSIS — R269 Unspecified abnormalities of gait and mobility: Secondary | ICD-10-CM | POA: Diagnosis not present

## 2022-11-18 DIAGNOSIS — M79605 Pain in left leg: Secondary | ICD-10-CM | POA: Diagnosis not present

## 2022-11-18 LAB — MULTIPLE MYELOMA PANEL, SERUM
Albumin SerPl Elph-Mcnc: 3.3 g/dL (ref 2.9–4.4)
Albumin/Glob SerPl: 1.3 (ref 0.7–1.7)
Alpha 1: 0.2 g/dL (ref 0.0–0.4)
Alpha2 Glob SerPl Elph-Mcnc: 0.7 g/dL (ref 0.4–1.0)
B-Globulin SerPl Elph-Mcnc: 0.8 g/dL (ref 0.7–1.3)
Gamma Glob SerPl Elph-Mcnc: 0.9 g/dL (ref 0.4–1.8)
Globulin, Total: 2.7 g/dL (ref 2.2–3.9)
IgA: 108 mg/dL (ref 61–437)
IgG (Immunoglobin G), Serum: 1004 mg/dL (ref 603–1613)
IgM (Immunoglobulin M), Srm: 23 mg/dL (ref 15–143)
M Protein SerPl Elph-Mcnc: 0.1 g/dL — ABNORMAL HIGH
Total Protein ELP: 6 g/dL (ref 6.0–8.5)

## 2022-11-20 ENCOUNTER — Other Ambulatory Visit: Payer: Self-pay

## 2022-11-20 ENCOUNTER — Inpatient Hospital Stay: Payer: Medicare Other

## 2022-11-20 ENCOUNTER — Inpatient Hospital Stay (HOSPITAL_BASED_OUTPATIENT_CLINIC_OR_DEPARTMENT_OTHER): Payer: Medicare Other | Admitting: Hematology

## 2022-11-20 VITALS — BP 145/67 | HR 62 | Temp 97.4°F | Resp 18 | Ht 68.0 in | Wt 193.9 lb

## 2022-11-20 DIAGNOSIS — C9000 Multiple myeloma not having achieved remission: Secondary | ICD-10-CM

## 2022-11-20 DIAGNOSIS — I25119 Atherosclerotic heart disease of native coronary artery with unspecified angina pectoris: Secondary | ICD-10-CM | POA: Diagnosis not present

## 2022-11-20 DIAGNOSIS — M818 Other osteoporosis without current pathological fracture: Secondary | ICD-10-CM

## 2022-11-20 MED ORDER — DENOSUMAB 60 MG/ML ~~LOC~~ SOSY
60.0000 mg | PREFILLED_SYRINGE | Freq: Once | SUBCUTANEOUS | Status: AC
  Administered 2022-11-20: 60 mg via SUBCUTANEOUS
  Filled 2022-11-20: qty 1

## 2022-11-20 NOTE — Progress Notes (Signed)
HEMATOLOGY/ONCOLOGY CLINIC NOTE  Date of Service: 11/20/22    Patient Care Team: Mayra Neer, MD as PCP - General (Family Medicine) Debara Pickett Nadean Corwin, MD as PCP - Cardiology (Cardiology)   CHIEF COMPLAINTS:  Follow-up for continued evaluation and management of multiple myeloma  HISTORY OF PRESENTING ILLNESS:  Please see previous note for details of initial presentation  CURRENT THERAPY:   Revlimid maintenance 5 mg po 3 weeks on 1 week off.   INTERVAL HISTORY:  Justin [name] is here for continued evaluation and management of multiple myeloma.  Patient was last seen by me on 07/29/2022 and was doing well overall, but complained of balance issues due to his back issues and grade 1 muscle cramps from his low-dose Revlimid.  Patient notes he has been doing well since our last visit. He report that he was tested positive for COVID-19 last month, but his symptoms were mild. He does not recall if he took medicine for COVID.   Patient denies muscles cramps during today's visit. He notes his muscle cramps stopped after he discontinued Atorvastatin 10 mg. He notes he stopped his omeprazole which caused hi acid reflux, so he started taking it again.  Patient complains of increased memory problems since our last visit. He has been to his PCP and Neurologist, and had Brain MRI which showed no evidence of acute intracranial abnormality.   He had many questions about medications, which were answered.   He denies fever, chills, abdominal pain, or leg swelling. Patient complains of new skin rashes on his forehead and face. He notes he recently had more squamous cell carcinomas removed. Patient continues to follow up with his Dermatologist for his squamous cell carcinoma.   He notes he had an CT scan in the past which showed some disk issues. He complains of mild lowe back pain during today's visit.   Patient has received his influenza vaccine, but not COVID-19 Booster. He is unsure  about RSV vaccine. He notes he will get the COVID-19 Booster in 3-4 months.    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 s/p RFCA on 05/01/15   Stroke ([place]) 2017   TIA and mild stroke; denies stroke symptoms since then     SURGICAL HISTORY: Past Surgical History:  Procedure Laterality Date   ATRIAL FIBRILLATION ABLATION     Dr. Lovena Le   BASAL CELL CARCINOMA EXCISION     off of back   ELECTROPHYSIOLOGIC STUDY N/A 05/01/2015   Procedure: SVT Ablation;  Surgeon: Evans Lance, MD;  Location: [facility];  Service: Cardiovascular;  Laterality: N/A;   EP IMPLANTABLE DEVICE N/A 06/04/2016   Procedure: Loop Recorder Insertion;  Surgeon: Thompson Grayer, MD;  Location: [facility];  Service: Cardiovascular;  Laterality: N/A;   implantable loop recorder removal     ILR removed by Dr Rayann Heman   INGUINAL HERNIA REPAIR Bilateral 06/16/2017   Procedure: LAPAROSCOPIC BILATERAL INGUINAL HERNIA REPAIR;  Surgeon: Clovis Riley, MD;  Location: [facility];  Service: General;  Laterality: Bilateral;  INSERTION OF MESH Bilateral 06/16/2017   Procedure: INSERTION OF MESH;  Surgeon: Clovis Riley, MD;  Location: [facility];  Service: General;  Laterality: Bilateral;   MASS EXCISION Right 07/28/2018   Procedure: EXCISION OF RIGHT FOREARM MASS;  Surgeon: Clovis Riley, MD;  Location: [facility];  Service: General;  Laterality: Right;   SQUAMOUS CELL CARCINOMA EXCISION     TEE WITHOUT CARDIOVERSION N/A 06/04/2016   Procedure: TRANSESOPHAGEAL ECHOCARDIOGRAM (TEE);  Surgeon: Pixie Casino, MD;  Location: [facility];  Service:  Cardiovascular;  Laterality: N/A;    SOCIAL HISTORY: Social History   Socioeconomic History   Marital status: Married    Spouse name: Not on file   Number of children: 3   Years of education: 16   Highest education level: Not on file  Occupational History   Not on file  Tobacco Use   Smoking status: Never   Smokeless tobacco: Never  Vaping Use   Vaping Use: Never used  Substance and Sexual Activity   Alcohol use: No   Drug use: No   Sexual activity: Not on file  Other Topics Concern   Not on file  Social History Narrative   Right handed   No caffeine   Two story home   Social Determinants of Health   Financial Resource Strain: Not on file  Food Insecurity: Not on file  Transportation Needs: Not on file  Physical Activity: Not on file  Stress: Not on file  Social Connections: Not on file  Intimate Partner Violence: Not on file   Ringo Sherod is retired. His daughter is Mudlogger of nursing for a nursing home in [place], [place].    FAMILY HISTORY: Family History  Problem Relation Age of Onset   Stroke Mother    Hypertension Mother    Alzheimer's disease Father    Heart failure Father    Down syndrome Daughter     ALLERGIES:  is allergic to other and no known allergies.  MEDICATIONS:  Current Outpatient Medications  Medication Sig Dispense Refill   atorvastatin (LIPITOR) 10 MG tablet Take 1 tablet (10 mg total) by mouth daily. 90 tablet 0   cyanocobalamin 1000 MCG tablet Take 1,000 mcg by mouth daily.     denosumab (PROLIA) 60 MG/ML SOSY injection See admin instructions.     denosumab (PROLIA) 60 MG/ML SOSY injection Inject 60 mg into the skin every 6 (six) months.     ergocalciferol (VITAMIN D2) 1.25 MG (50000 UT) capsule Take 1 capsule (50,000 Units total) by mouth once a week. 12 capsule 5   FREESTYLE LITE test strip      glucose monitoring kit (FREESTYLE) monitoring kit 1 each by Does not apply route as needed for other.     Lancets (FREESTYLE) lancets 1  each daily.     lenalidomide (REVLIMID) 5 MG capsule TAKE 1 CAPSULE DAILY FOR 21 DAYS ON THEN 7 DAYS OFF. REPEAT EVERY 28 DAYS. 21 capsule 0   metFORMIN (GLUCOPHAGE) 1000 MG tablet TAKE ONE-HALF (1/2) TABLET DAILY WITH BREAKFAST 90 tablet 3   nitroGLYCERIN (NITROSTAT) 0.4 MG SL tablet Place 1 tablet (0.4 mg total) under the tongue every 5 (five) minutes as needed for chest pain (Max 3 doses in 15 mins.). (Patient not taking: Reported on 10/02/2022) 30 tablet 0   omeprazole (PRILOSEC) 20 MG capsule Take 20 mg by mouth every morning.      Potassium 99 MG TABS 1 tablet     pregabalin (LYRICA) 75 MG capsule  Take 75 mg by mouth 3 (three) times daily.     XARELTO 20 MG TABS tablet TAKE 1 TABLET DAILY WITH SUPPER 90 tablet 3   No current facility-administered medications for this visit.    REVIEW OF SYSTEMS:   10 Point review of Systems was done is negative except as noted above.  PHYSICAL EXAMINATION:   ECOG PERFORMANCE STATUS: 2 - Symptomatic, <50% confined to bed Vitals:   11/20/22 1020  BP: (!) 145/67  Pulse: 62  Resp: 18  Temp: (!) 97.4 F (36.3 C)  SpO2: 100%      Wt Readings from Last 3 Encounters:  11/20/22 193 lb 14.4 oz (88 kg)  10/02/22 198 lb (89.8 kg)  07/29/22 197 lb 8 oz (89.6 kg)   Body mass index is 29.48 kg/m.  NAD GENERAL:alert, in no acute distress and comfortable SKIN: no acute rashes, no significant lesions EYES: conjunctiva are pink and non-injected, sclera anicteric OROPHARYNX: MMM, no exudates, no oropharyngeal erythema or ulceration NECK: supple, no JVD LYMPH:  no palpable lymphadenopathy in the cervical, axillary or inguinal regions LUNGS: clear to auscultation b/l with normal respiratory effort HEART: regular rate & rhythm ABDOMEN:  normoactive bowel sounds , non tender, not distended. Extremity: no pedal edema PSYCH: alert & oriented x 3 with fluent speech NEURO: no focal motor/sensory deficits  LABORATORY DATA:  I have reviewed the data as  listed.    Latest Ref Rng & Units 11/13/2022    8:38 AM 07/01/2022   10:17 AM 04/25/2022   11:03 AM  CBC  WBC 4.0 - 10.5 K/uL 2.6  2.4  2.8   Hemoglobin 13.0 - 17.0 g/dL 12.1  12.9  11.3   Hematocrit 39.0 - 52.0 % 36.1  37.9  32.9   Platelets 150 - 400 K/uL 110  100  122   ANC 1.1k     Latest Ref Rng & Units 11/13/2022    8:38 AM 07/01/2022   10:17 AM 04/25/2022   11:03 AM  CMP  Glucose 70 - 99 mg/dL 131  162  122   BUN 8 - 23 mg/dL _0 Creatinine 0.61 - 1.24 mg/dL 1.16  1.19  1.10   Sodium 135 - 145 mmol/L 139  136  139   Potassium 3.5 - 5.1 mmol/L 4.1  3.9  3.6   Chloride 98 - 111 mmol/L 108  104  110   CO2 22 - 32 mmol/L _1 Calcium 8.9 - 10.3 mg/dL 8.8  8.8  7.9   Total Protein 6.5 - 8.1 g/dL 6.6  6.6  5.6   Total Bilirubin 0.3 - 1.2 mg/dL 1.0  1.1  0.8   Alkaline Phos 38 - 126 U/L 47  53  51   AST 15 - 41 U/L _2 ALT 0 - 44 U/L _3 RADIOGRAPHIC STUDIES: I have personally reviewed the radiological images as listed and agreed with the findings in the report.       Bone Marrow Biopsy 12/24/2016 (Accession [id number])  Diagnosis Bone Marrow, Aspirate,Biopsy, and Clot, left iliac crest - HYPERCELLULAR BONE MARROW FOR AGE WITH PLASMA CELL NEOPLASM. - SEE COMMENT. PERIPHERAL BLOOD: - NORMOCYTIC-NORMOCHROMIC ANEMIA. - LEUKOPENIA.  DG Bone Survey Met (Accession [id number]) (Order [id number])  Imaging  Date: 11/28/2016 Department: [facility] Released By: Park Pope Priddy Authorizing: Brunetta Genera,  MD  Exam Information   Status Exam Begun  Exam Ended   Final [99] 11/28/2016 11:59 AM 11/28/2016 12:36 PM  PACS Images   Show images for DG Bone Survey Met  Study Result   CLINICAL DATA:  Monoclonal gammopathy of unknown significance. History of squamous cell carcinoma excision, basal cell carcinoma excision.   EXAM: METASTATIC BONE SURVEY   COMPARISON:  CT neck, chest, abdomen and  pelvis from 10/29/16, MRI of the head from 06/01/2016, CXR 10/27/2016, lumbar spine radiographs 06/24/2016   FINDINGS: Lateral skull: Small occipital lucency which may represent a normal arachnoid granulation posteriorly in the occiput. No definite lytic abnormality.   Cervical spine AP and lateral: Disc space narrowing at C2-3, and from C4 through C7. C4-5, C5-6 and C6-7 uncovertebral joint spurring bilaterally. No lytic abnormality.   Thoracic spine AP and lateral: T8-9 right-sided osteophytes and to a lesser degree T9-10. No lytic abnormality. Slight multilevel lumbar thoracic disc space narrowing likely degenerative.   Lumbar spine AP and lateral: Disc space narrowing at L4-5 and to a greater extent L5-S1. No lytic disease. L3 through S1 facet sclerosis.   AP pelvis: Negative for lytic disease.   Bilateral upper and lower extremities shoulders through wrist and from the hips through ankle: Negative for lytic disease. Joint space narrowing of the femorotibial compartments both knees. Subchondral cyst of the right patella.   CXR: Clear lungs. Cardiac implantable monitoring device projects over the left heart. Aortic atherosclerosis. No lytic disease.   IMPRESSION: No findings suspicious for lytic disease.     Electronically Signed   By: Ashley Royalty M.D.   On: 11/28/2016 14:58     ASSESSMENT & PLAN:   81 y.o. 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 (12/2016) Peak M spike 2.6  Cytogenetics and Myeloma FISH panel.- trisomy 11 Patient has normocytic anemia without any other clear etiology. (>2g/dl lower that lower limit of normal which is 13) which per criteria would place this in the Multiple myeloma category as opposed to Smoldering Multiple myeloma (Borderline criterion)  No overt renal failure or hypercalcemia at this time No focal bone pains though he has significant chronic back  pain related to degenerative disc disease. Bone survey shows no overt lytic lesions.  10/16/18 CT Coronary Morph revealed Coronary artery calcium score 299 Agatston units, this places the patient in the 47th percentile for age and gender, suggesting intermediate risk for future cardiac events. 2. Nonobstructive coronary disease.  12/08/18 Bone Density study revealed that the pt has osteoporosis  2) Anemia and thrombocytopenia -- related to treatment (Revlimid) -stable  3) Grade 1 Neuropathy/radiculopathy-- in b/l feet and halfway up lower leg without pain  4) Borderline low B12 levels, previously 299 --- on replacement -  -continue replacement.  5) h/o recurrent CVA - thought to be related to small vessel disease as per neurology. Has a small PFO which could be an additional risk factor as well as his afib  6)  P afib Plan -Continue on Xarelto per cardiology.  7) H/o recurrent SCC -Underwent surgical excision on right forearm with Dr. Romana Juniper on 07/28/18. His surgical pathology showed evidence of Squamous Cell Carcinoma. Plan -continue dermatology followup  10) Lower extremity discomfort - improved, stable. Likely Discogenic pain -[place] on 12/2017 revealed no blood clot -Pt has established care with orthopedist and PT.  11) Patient Active Problem List   Diagnosis Date Noted  Mild cognitive impairment 10/03/2022   Osteoporosis 03/10/2019   Multiple myeloma without remission ([place]) 03/10/2019   Chest pain 09/27/2018   Multiple myeloma ([place]) 09/27/2018   Degeneration of lumbar intervertebral disc 01/30/2018   Low back pain 01/30/2018   Lumbar radiculopathy 01/30/2018   History of loop recorder 06/12/2017   Cryptogenic stroke ([place]) 10/28/2016   Gait abnormality    History of CVA (cerebrovascular accident)    History of TIA (transient ischemic attack)    Benign essential HTN    Paroxysmal SVT (supraventricular tachycardia)    Acute blood loss anemia    Acute ischemic stroke  ([place])    CVA (cerebral vascular accident) ([place]) 10/26/2016   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   History of recent stroke 06/06/2016   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 Labs shows WBC of 2.6 K, Hemoglobin of 12.1 K, platelets of 110 K, calcium of 8.8. Myeloma panel shows stable M spike of 0.1g/dl of IgG Lambda Patient has no lab or clinical evidence of myeloma progression at this time. - no prohibitive toxicities from Revlimid maintenance at this time. -Recommend COVID-19 Booster in 3-4 months since he had COVID-19 last month.  -Recommend RSV vaccine.    FOLLOW UP: Return to clinic with Dr. Irene Limbo in 12 weeks Labs in 11 weeks PLz schedule next 2 doses of Prolia every 6 months   The total time spent in the appointment was 20 minutes* .  All of the patient's questions were answered with apparent satisfaction. The patient knows to call the clinic with any problems, questions or concerns.   Sullivan Lone MD MS AAHIVMS [facility] Hematology/Oncology Physician [facility]  .*Total Encounter Time as defined by the Centers for Medicare and Medicaid Services includes, in addition to the face-to-face time of a patient visit (documented in the note above) non-face-to-face time: obtaining and reviewing outside history, ordering and reviewing medications, tests or procedures, care coordination (communications with other health care professionals or caregivers) and  documentation in the medical record.   I, Cleda Mccreedy, am acting as a Education administrator for Sullivan Lone, MD. .I have reviewed the above documentation for accuracy and completeness, and I agree with the above. Brunetta Genera MD

## 2022-11-26 ENCOUNTER — Encounter: Payer: Self-pay | Admitting: Hematology

## 2022-11-27 DIAGNOSIS — M79604 Pain in right leg: Secondary | ICD-10-CM | POA: Diagnosis not present

## 2022-11-27 DIAGNOSIS — R531 Weakness: Secondary | ICD-10-CM | POA: Diagnosis not present

## 2022-11-27 DIAGNOSIS — M79605 Pain in left leg: Secondary | ICD-10-CM | POA: Diagnosis not present

## 2022-11-27 DIAGNOSIS — R269 Unspecified abnormalities of gait and mobility: Secondary | ICD-10-CM | POA: Diagnosis not present

## 2022-11-28 ENCOUNTER — Other Ambulatory Visit: Payer: Self-pay

## 2022-11-28 DIAGNOSIS — C9001 Multiple myeloma in remission: Secondary | ICD-10-CM

## 2022-11-28 MED ORDER — LENALIDOMIDE 5 MG PO CAPS: ORAL_CAPSULE | ORAL | 0 refills | Status: DC

## 2022-12-03 ENCOUNTER — Ambulatory Visit
Admission: RE | Admit: 2022-12-03 | Discharge: 2022-12-03 | Disposition: A | Discharge status: Home / Self Care | Payer: Medicare Other | Source: Ambulatory Visit | Attending: Family Medicine | Admitting: Family Medicine

## 2022-12-03 DIAGNOSIS — K862 Cyst of pancreas: Secondary | ICD-10-CM | POA: Diagnosis not present

## 2022-12-03 MED ORDER — GADOPICLENOL 0.5 MMOL/ML IV SOLN
8.0000 mL | Freq: Once | INTRAVENOUS | Status: AC | PRN
  Administered 2022-12-03: 8 mL via INTRAVENOUS

## 2022-12-05 DIAGNOSIS — M79604 Pain in right leg: Secondary | ICD-10-CM | POA: Diagnosis not present

## 2022-12-05 DIAGNOSIS — R531 Weakness: Secondary | ICD-10-CM | POA: Diagnosis not present

## 2022-12-05 DIAGNOSIS — M79605 Pain in left leg: Secondary | ICD-10-CM | POA: Diagnosis not present

## 2022-12-05 DIAGNOSIS — R269 Unspecified abnormalities of gait and mobility: Secondary | ICD-10-CM | POA: Diagnosis not present

## 2022-12-24 ENCOUNTER — Other Ambulatory Visit: Payer: Self-pay

## 2022-12-24 DIAGNOSIS — C9001 Multiple myeloma in remission: Secondary | ICD-10-CM

## 2022-12-24 MED ORDER — LENALIDOMIDE 5 MG PO CAPS: ORAL_CAPSULE | ORAL | 0 refills | Status: DC

## 2022-12-30 DIAGNOSIS — R269 Unspecified abnormalities of gait and mobility: Secondary | ICD-10-CM | POA: Diagnosis not present

## 2022-12-30 DIAGNOSIS — R531 Weakness: Secondary | ICD-10-CM | POA: Diagnosis not present

## 2022-12-30 DIAGNOSIS — M79604 Pain in right leg: Secondary | ICD-10-CM | POA: Diagnosis not present

## 2022-12-30 DIAGNOSIS — M79605 Pain in left leg: Secondary | ICD-10-CM | POA: Diagnosis not present

## 2023-01-02 ENCOUNTER — Ambulatory Visit (INDEPENDENT_AMBULATORY_CARE_PROVIDER_SITE_OTHER): Payer: Medicare Other | Admitting: Physician Assistant

## 2023-01-02 ENCOUNTER — Encounter: Payer: Self-pay | Admitting: Physician Assistant

## 2023-01-02 VITALS — BP 143/61 | HR 92 | Resp 18 | Ht 68.0 in | Wt 198.0 lb

## 2023-01-02 DIAGNOSIS — R413 Other amnesia: Secondary | ICD-10-CM | POA: Diagnosis not present

## 2023-01-02 NOTE — Patient Instructions (Addendum)
It was a pleasure to see you today at our office.   Recommendations:  Neurocognitive evaluation at our office is scheduled for 06/2023  Folllow up in 6 months     Increase activity, agree with PT/OT  Keep taking B12 supplements    Whom to call:  Memory  decline, memory medications: Call our office [phone]   For psychiatric meds, mood meds: Please have your primary care physician manage these medications.      For assessment of decision of mental capacity and competency:  Call Dr. Anthoney Harada, geriatric psychiatrist at [phone]  For guidance in geriatric dementia issues please call [facility] [phone]    If you have any severe symptoms of a stroke, or other severe issues such as confusion,severe chills or fever, etc call 911 or go to the ER as you may need to be evaluated further      RECOMMENDATIONS FOR ALL PATIENTS WITH MEMORY PROBLEMS: 1. Continue to exercise (Recommend 30 minutes of walking everyday, or 3 hours every week) 2. Increase social interactions - continue going to [place] and enjoy social gatherings with friends and family 3. Eat healthy, avoid fried foods and eat more fruits and vegetables 4. Maintain adequate blood pressure, blood sugar, and blood cholesterol level. Reducing the risk of stroke and cardiovascular disease also helps promoting better memory. 5. Avoid stressful situations. Live a simple life and avoid aggravations. Organize your time and prepare for the next day in anticipation. 6. Sleep well, avoid any interruptions of sleep and avoid any distractions in the bedroom that may interfere with adequate sleep quality 7. Avoid sugar, avoid sweets as there is a strong link between excessive sugar intake, diabetes, and cognitive impairment We discussed the Mediterranean diet, which has been shown to help patients reduce the risk of progressive memory disorders and reduces cardiovascular risk. This includes eating fish, eat fruits and  green leafy vegetables, nuts like almonds and hazelnuts, walnuts, and also use olive oil. Avoid fast foods and fried foods as much as possible. Avoid sweets and sugar as sugar use has been linked to worsening of memory function.  There is always a concern of gradual progression of memory problems. If this is the case, then we may need to adjust level of care according to patient needs. Support, both to the patient and caregiver, should then be put into place.      You have been referred for a neuropsychological evaluation (i.e., evaluation of memory and thinking abilities). Please bring someone with you to this appointment if possible, as it is helpful for the doctor to hear from both you and another adult who knows you well. Please bring eyeglasses and hearing aids if you wear them.    The evaluation will take approximately 3 hours and has two parts:   The first part is a clinical interview with the neuropsychologist (Dr. Melvyn Novas or Dr. Nicole Kindred). During the interview, the neuropsychologist will speak with you and the individual you brought to the appointment.    The second part of the evaluation is testing with the doctor's technician Hinton Dyer or Maudie Mercury). During the testing, the technician will ask you to remember different types of material, solve problems, and answer some questionnaires. Your family member will not be present for this portion of the evaluation.   Please note: We must reserve several hours of the neuropsychologist's time and the psychometrician's time for your evaluation appointment. As such, there is a No-Show fee of $100. If you are unable to  attend any of your appointments, please contact our office as soon as possible to reschedule.    FALL PRECAUTIONS: Be cautious when walking. Scan the area for obstacles that may increase the risk of trips and falls. When getting up in the mornings, sit up at the edge of the bed for a few minutes before getting out of bed. Consider elevating the bed  at the head end to avoid drop of blood pressure when getting up. Walk always in a well-lit room (use night lights in the walls). Avoid area rugs or power cords from appliances in the middle of the walkways. Use a walker or a cane if necessary and consider physical therapy for balance exercise. Get your eyesight checked regularly.  FINANCIAL OVERSIGHT: Supervision, especially oversight when making financial decisions or transactions is also recommended.  HOME SAFETY: Consider the safety of the kitchen when operating appliances like stoves, microwave oven, and blender. Consider having supervision and share cooking responsibilities until no longer able to participate in those. Accidents with firearms and other hazards in the house should be identified and addressed as well.   ABILITY TO BE LEFT ALONE: If patient is unable to contact 911 operator, consider using LifeLine, or when the need is there, arrange for someone to stay with patients. Smoking is a fire hazard, consider supervision or cessation. Risk of wandering should be assessed by caregiver and if detected at any point, supervision and safe proof recommendations should be instituted.  MEDICATION SUPERVISION: Inability to self-administer medication needs to be constantly addressed. Implement a mechanism to ensure safe administration of the medications.   DRIVING: Regarding driving, in patients with progressive memory problems, driving will be impaired. We advise to have someone else do the driving if trouble finding directions or if minor accidents are reported. Independent driving assessment is available to determine safety of driving.   If you are interested in the driving assessment, you can contact the following:  [employer] in [place]  [place] [phone] or [phone]    [place] refers to food and lifestyle choices that are based on the traditions of countries located on the [employer]. This way of eating has been shown to help prevent certain conditions and improve outcomes for people who have chronic diseases, like kidney disease and heart disease. What are tips for following this plan? Lifestyle  Cook and eat meals together with your family, when possible. Drink enough fluid to keep your urine clear or pale yellow. Be physically active every day. This includes: Aerobic exercise like running or swimming. Leisure activities like gardening, walking, or housework. Get 7-8 hours of sleep each night. If recommended by your health care provider, drink red wine in moderation. This means 1 glass a day for nonpregnant women and 2 glasses a day for men. A glass of wine equals 5 oz (150 mL). Reading food labels  Check the serving size of packaged foods. For foods such as rice and pasta, the serving size refers to the amount of cooked product, not dry. Check the total fat in packaged foods. Avoid foods that have saturated fat or trans fats. Check the ingredients list for added sugars, such as corn syrup. Shopping  At the grocery store, buy most of your food from the areas near the walls of the store. This includes: Fresh fruits and vegetables (produce). Grains, beans, nuts, and seeds. Some of these may be available  in unpackaged forms or large amounts (in bulk). Fresh seafood. Poultry and eggs. Low-fat dairy products. Buy whole ingredients instead of prepackaged foods. Buy fresh fruits and vegetables in-season from local farmers markets. Buy frozen fruits and vegetables in resealable bags. If you do not have access to quality fresh seafood, buy precooked frozen shrimp or canned fish, such as tuna, salmon, or sardines. Buy small amounts of raw or cooked vegetables, salads, or olives from the deli or salad bar at your store. Stock your pantry so you always have  certain foods on hand, such as olive oil, canned tuna, canned tomatoes, rice, pasta, and beans. Cooking  Cook foods with extra-virgin olive oil instead of using butter or other vegetable oils. Have meat as a side dish, and have vegetables or grains as your main dish. This means having meat in small portions or adding small amounts of meat to foods like pasta or stew. Use beans or vegetables instead of meat in common dishes like chili or lasagna. Experiment with different cooking methods. Try roasting or broiling vegetables instead of steaming or sauteing them. Add frozen vegetables to soups, stews, pasta, or rice. Add nuts or seeds for added healthy fat at each meal. You can add these to yogurt, salads, or vegetable dishes. Marinate fish or vegetables using olive oil, lemon juice, garlic, and fresh herbs. Meal planning  Plan to eat 1 vegetarian meal one day each week. Try to work up to 2 vegetarian meals, if possible. Eat seafood 2 or more times a week. Have healthy snacks readily available, such as: Vegetable sticks with hummus. Greek yogurt. Fruit and nut trail mix. Eat balanced meals throughout the week. This includes: Fruit: 2-3 servings a day Vegetables: 4-5 servings a day Low-fat dairy: 2 servings a day Fish, poultry, or lean meat: 1 serving a day Beans and legumes: 2 or more servings a week Nuts and seeds: 1-2 servings a day Whole grains: 6-8 servings a day Extra-virgin olive oil: 3-4 servings a day Limit red meat and sweets to only a few servings a month What are my food choices? Mediterranean diet Recommended Grains: Whole-grain pasta. Brown rice. Bulgar wheat. Polenta. Couscous. Whole-wheat bread. [place]. Vegetables: Artichokes. Beets. Broccoli. Cabbage. Carrots. Eggplant. Green beans. Chard. Kale. Spinach. Onions. Leeks. Peas. Squash. Tomatoes. Peppers. Radishes. Fruits: Apples. Apricots. Avocado. Berries. Bananas. Cherries. Dates. Figs. Grapes. Lemons. Melon.  Oranges. Peaches. Plums. Pomegranate. Meats and other protein foods: Beans. Almonds. Sunflower seeds. Pine nuts. Peanuts. [place]. Salmon. Scallops. Shrimp. [place]. Tilapia. Clams. Oysters. Eggs. Dairy: Low-fat milk. Cheese. Greek yogurt. Beverages: Water. Red wine. Herbal tea. Fats and oils: Extra virgin olive oil. Avocado oil. Grape seed oil. Sweets and desserts: [place] yogurt with honey. Baked apples. Poached pears. Trail mix. Seasoning and other foods: Basil. Cilantro. Coriander. Cumin. Mint. Parsley. Sage. Rosemary. Tarragon. Garlic. Oregano. Thyme. Pepper. Balsalmic vinegar. Tahini. Hummus. Tomato sauce. Olives. Mushrooms. Limit these Grains: Prepackaged pasta or rice dishes. Prepackaged cereal with added sugar. Vegetables: Deep fried potatoes (french fries). Fruits: Fruit canned in syrup. Meats and other protein foods: Beef. Pork. Lamb. Poultry with skin. Hot dogs. [place] Salines. Dairy: Ice cream. Sour cream. Whole milk. Beverages: Juice. Sugar-sweetened soft drinks. Beer. Liquor and spirits. Fats and oils: Butter. Canola oil. Vegetable oil. Beef fat (tallow). Lard. Sweets and desserts: Cookies. Cakes. Pies. Candy. Seasoning and other foods: Mayonnaise. Premade sauces and marinades. The items listed may not be a complete list. Talk with your dietitian about what dietary choices are right for you. Summary [place]  diet includes both food and lifestyle choices. Eat a variety of fresh fruits and vegetables, beans, nuts, seeds, and whole grains. Limit the amount of red meat and sweets that you eat. Talk with your health care provider about whether it is safe for you to drink red wine in moderation. This means 1 glass a day for nonpregnant women and 2 glasses a day for men. A glass of wine equals 5 oz (150 mL). This information is not intended to replace advice given to you by your health care provider. Make sure you discuss any questions you have with your health care provider. Document  Released: 08/01/2016 Document Revised: 09/03/2016 Document Reviewed: 08/01/2016 Elsevier Interactive Patient Education  2017 [place] provider has requested that you have labwork completed today. Please go to [facility] ([address]) on the second floor of this building before leaving the office today. You do not need to check in. If you are not called within 15 minutes please check with the front desk.
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
## 2023-01-03 DIAGNOSIS — D61818 Other pancytopenia: Secondary | ICD-10-CM | POA: Diagnosis not present

## 2023-01-03 DIAGNOSIS — D6869 Other thrombophilia: Secondary | ICD-10-CM | POA: Diagnosis not present

## 2023-01-03 DIAGNOSIS — I679 Cerebrovascular disease, unspecified: Secondary | ICD-10-CM | POA: Diagnosis not present

## 2023-01-03 DIAGNOSIS — Z0000 Encounter for general adult medical examination without abnormal findings: Secondary | ICD-10-CM | POA: Diagnosis not present

## 2023-01-03 DIAGNOSIS — R413 Other amnesia: Secondary | ICD-10-CM | POA: Diagnosis not present

## 2023-01-03 DIAGNOSIS — G5700 Lesion of sciatic nerve, unspecified lower limb: Secondary | ICD-10-CM | POA: Diagnosis not present

## 2023-01-03 DIAGNOSIS — E1169 Type 2 diabetes mellitus with other specified complication: Secondary | ICD-10-CM | POA: Diagnosis not present

## 2023-01-03 DIAGNOSIS — C9000 Multiple myeloma not having achieved remission: Secondary | ICD-10-CM | POA: Diagnosis not present

## 2023-01-03 DIAGNOSIS — I10 Essential (primary) hypertension: Secondary | ICD-10-CM | POA: Diagnosis not present

## 2023-01-03 DIAGNOSIS — D692 Other nonthrombocytopenic purpura: Secondary | ICD-10-CM | POA: Diagnosis not present

## 2023-01-03 DIAGNOSIS — G629 Polyneuropathy, unspecified: Secondary | ICD-10-CM | POA: Diagnosis not present

## 2023-01-03 DIAGNOSIS — I4891 Unspecified atrial fibrillation: Secondary | ICD-10-CM | POA: Diagnosis not present

## 2023-01-07 DIAGNOSIS — M79604 Pain in right leg: Secondary | ICD-10-CM | POA: Diagnosis not present

## 2023-01-07 DIAGNOSIS — R531 Weakness: Secondary | ICD-10-CM | POA: Diagnosis not present

## 2023-01-07 DIAGNOSIS — M79605 Pain in left leg: Secondary | ICD-10-CM | POA: Diagnosis not present

## 2023-01-07 DIAGNOSIS — R269 Unspecified abnormalities of gait and mobility: Secondary | ICD-10-CM | POA: Diagnosis not present

## 2023-01-12 ENCOUNTER — Other Ambulatory Visit: Payer: Self-pay | Admitting: Internal Medicine

## 2023-01-13 ENCOUNTER — Ambulatory Visit: Payer: Medicare Other | Attending: Internal Medicine | Admitting: Internal Medicine

## 2023-01-13 ENCOUNTER — Encounter: Payer: Self-pay | Admitting: Internal Medicine

## 2023-01-13 VITALS — BP 122/66 | HR 48 | Ht 68.0 in | Wt 193.6 lb

## 2023-01-13 DIAGNOSIS — I10 Essential (primary) hypertension: Secondary | ICD-10-CM | POA: Diagnosis not present

## 2023-01-13 DIAGNOSIS — I480 Paroxysmal atrial fibrillation: Secondary | ICD-10-CM | POA: Diagnosis not present

## 2023-01-13 DIAGNOSIS — Z8673 Personal history of transient ischemic attack (TIA), and cerebral infarction without residual deficits: Secondary | ICD-10-CM

## 2023-01-13 DIAGNOSIS — E785 Hyperlipidemia, unspecified: Secondary | ICD-10-CM | POA: Diagnosis not present

## 2023-01-13 MED ORDER — PRAVASTATIN SODIUM 10 MG PO TABS: 10.0000 mg | ORAL_TABLET | Freq: Every day | ORAL | 3 refills | Status: DC

## 2023-01-13 NOTE — Patient Instructions (Signed)
Medication Instructions:  STOP atorvastatin   Stay off for a few weeks and then START pravastatin '10mg'$  daily  *If you need a refill on your cardiac medications before your next appointment, please call your pharmacy*   Follow-Up: At [facility], you and your health needs are our priority.  As part of our continuing mission to provide you with exceptional heart care, we have created designated Provider Care Teams.  These Care Teams include your primary Cardiologist (physician) and Advanced Practice Providers (APPs -  Physician Assistants and Nurse Practitioners) who all work together to provide you with the care you need, when you need it.  We recommend signing up for the patient portal called "MyChart".  Sign up information is provided on this After Visit Summary.  MyChart is used to connect with patients for Virtual Visits (Telemedicine).  Patients are able to view lab/test results, encounter notes, upcoming appointments, etc.  Non-urgent messages can be sent to your provider as well.   To learn more about what you can do with MyChart, go to [email or web address].    Your next appointment:    12 months with Dr. Debara Pickett

## 2023-01-13 NOTE — Progress Notes (Signed)
OFFICE NOTE  Chief Complaint:  Follow-up  Primary Care Physician: Mayra Neer, MD  HPI:  Justin [name] is a pleasant 82 year old male who I met recently in the hospital. He presented with chest pain that he developed while sitting his computer. The symptoms had some typical and atypical features for angina. He ruled out for MI and underwent nuclear stress testing which was negative for ischemia. During the stress test while exercising he developed an SVT with heart rate that shot up abruptly to 220. This responded to vagal maneuvers and then reoccurred and eventually subsided. He was started on low-dose beta blocker and eventually discharged without any recurrent symptoms. He was placed on a monitor and has follow-up with cardiac electrophysiology to see Dr. Lovena Le in January. 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 He was started on Lipitor which was started the hospital for for coronary disease, however his LDL is only 95. He is a diabetic. His metformin was increased to 500 twice a day which is improved his blood sugars. He recently saw his primary care provider felt that he was doing well.  I saw Justin [name] back in the office today. He was referred to Dr. Cristopher Peru for SVT ablation consideration, and this was offered to him. Prior to having this scheduled he developed an episode of chest pain again and that brought him to the hospital yesterday. He was evaluated by Dr. Dorris Carnes, and had a right upper quadrant ultrasound which was unremarkable. He also had coronary CT angiogram which demonstrated mild to moderate coronary disease and an intermediate coronary calcium score of 202. He has subsequently worn a monitor between 12/21/2015 and 01/18/2015 which showed no recurrent SVT and normal sinus rhythm.  The etiology of his pain episodes is unclear however may be related to  reflux.   Justin [name] returns today for follow-up. He reports he is done very well without any recurrent SVT status post catheter ablation by Dr. Cristopher Peru. He is seeing me primarily for risk factor modification and treatment of asymptomatic coronary artery disease which was seen by coronary artery CT angiography. He was found to have 2 areas of mild coronary artery disease and a calcium score which was moderately elevated at 202. Since then he's been on lipid therapy with Lipitor and takes daily aspirin as well as my card is HCTZ for hypertension. Blood pressure is well-controlled today 106/60. He is due for repeat lipid profile and will need a refill on his cholesterol medication. He denies any chest pain or shortness of breath. 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  06/06/2016  Justin [name] returns today for follow-up. He was seen by myself in the hospital after he presented with symptoms of possible TIA. This is following recent hospitalization at [facility] in [place] where he had a stroke. MRI confirmed this although he has had no significant lasting deficit. His initial symptoms were left arm heaviness and numbness. He also subsequently had some right arm tingling but the symptoms were different than his original presentation. This was thought to be TIA. He was evaluated by neurology however repeat imaging failed to show any new deficits. Subsequently he was referred for a TEE which I performed. This demonstrated a small bidirectional PFO. He had a bifid left atrial appendage without any  thrombus. While he did have PFO, it's not clear that this was the etiology of his stroke or TIA. Venous Dopplers of the legs were negative for thrombus. He was changed from aspirin to Plavix. We also recommended and implanted loop recorder which was placed by Dr. Rayann Heman. The thought is that he may be having intermittent atrial fibrillation, as he has a history of  SVT underwent ablation.  06/12/2017  Justin [name] returns today for follow-up. He's had a very eventful year. 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 He's had problems with recurrent skin cancer. He has had no further stroke or TIA and has done well on aspirin and Plavix. Recently his neurologist he discontinued Plavix and is currently only on full dose aspirin. He did have an implanted loop recorder and multiple interrogations have failed to show any recurrent or intermittent atrial fibrillation. He denies any recurrent SVT. This is not been noted as well. Blood pressure is well controlled and he said recent weight loss which is appropriate at this point.  10/01/2018  Justin [name] is seen today in follow-up.  Is been over a year since I saw him.  Unfortunately was recently admitted with precordial chest pain last week.  He ruled out for MI and an outpatient echocardiogram was recommended.  Demonstrated LVEF 55 to 60%, mild LVH and grade 1 diastolic dysfunction.  No regional wall motion abnormalities.  Then he reports no recurrent chest pain.  He continues however to have some physical decline.  He was noted to be using a walker today which she did not last year.  Partially may be related to stroke, however previously he was flying airplanes 2 years ago.  Denies recurrent palpitations.  Loop recorder interrogation is failed to show recurrent arrhythmia.  11/04/2018  Justin [name] returns today for follow-up.  He underwent CT coronary angiography which showed no obstructive coronary disease and a mild increase in coronary calcium to 229 compared to study in 2016.  I do suspect his chest pain is noncardiac.  He actually now saying that he is having more belching and gas and discomfort after eating which sounds GI in nature.  He does not have a current gastroenterologist.  12/30/2019  Justin [name] returns today for annual follow-up.  Earlier this morning he had a virtual visit with Dr. Rayann Heman.   This was for follow-up of his loop recorder which is now at end of battery life.  Ultimately he will want it explanted.  He did not show any further events.  He had brief episodes of A. fib and has been anticoagulated for that but has not had recent recurrence.  Nonetheless since we will be monitoring him forward, I would recommend he remain on lifelong anticoagulation with Xarelto.  Overall he says he is doing much better.  He says he is in remission with regards to his multiple myeloma.  Cholesterols been well controlled with recent lipid profile showing total cholesterol 126, triglycerides 154, HDL 66 and LDL of 63.  He is interested in possibly getting back to flying and might consider the basic med program.  01/08/2022  Justin [name] returns today for follow-up.  Overall he seems to be doing well but has a number of questions today.  He stopped taking a number of vitamins recently.  He had some concerns about his implanted loop recorder which has not been removed.  At this point the battery life is expired.  He said it was only noted that he had 1 episode  of A. fib which was short in duration but therefore was recommended to have lifelong anticoagulation on Xarelto.  He seems to be tolerating that well.  He has had no recurrent arrhythmias.  He did have 1 episode of what sounded like anginal chest pain.  He took nitroglycerin with some relief and then called EMS.  On arrival EKG was normal.  He was given the option to go to the ER but he declined.  He has not had any further symptoms.  He had lab work last week from his primary care provider which showed total cholesterol 140, HDL 74, triglycerides 80 and LDL 50 on low-dose atorvastatin.   01/13/2023  Justin [name] returns today for follow-up.  Overall he says he is doing pretty well.  He reports that he is been having some issues with some leg cramps and occasionally in his hands.  He thinks this might be related to the atorvastatin he is on which is a low-dose at 10 mg.   It is actually quite effective as his cholesterols been low.  In November total was 174, HDL 79, triglycerides 144 and LDL of 50.  He does have coronary disease however based on coronary calcification which was seen on CT in the past.  Given history of prior stroke his target LDL is less than 70.  He denies any recurrent palpitations.  He is noted to be bradycardic today but has been asymptomatic with this.  He is not on any AV nodal blocking medications.  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 s/p RFCA on 05/01/15   Stroke ([place]) 2017   TIA and mild stroke; denies stroke symptoms since then     Past Surgical History:  Procedure Laterality Date   ATRIAL FIBRILLATION ABLATION     Dr. Lovena Le   BASAL CELL CARCINOMA EXCISION     off of back   ELECTROPHYSIOLOGIC STUDY N/A 05/01/2015   Procedure: SVT Ablation;  Surgeon: Evans Lance, MD;  Location: [facility];  Service: Cardiovascular;  Laterality: N/A;   EP IMPLANTABLE DEVICE N/A 06/04/2016   Procedure: Loop Recorder Insertion;  Surgeon: Thompson Grayer, MD;  Location: [facility];  Service: Cardiovascular;  Laterality: N/A;   implantable loop recorder removal     ILR removed by Dr Rayann Heman   INGUINAL HERNIA REPAIR Bilateral 06/16/2017   Procedure: LAPAROSCOPIC BILATERAL INGUINAL HERNIA REPAIR;  Surgeon: Clovis Riley, MD;  Location: [facility];  Service: General;  Laterality: Bilateral;   INSERTION OF MESH Bilateral 06/16/2017   Procedure: INSERTION OF MESH;  Surgeon: Clovis Riley, MD;  Location: [facility];  Service: General;  Laterality: Bilateral;   MASS EXCISION Right  07/28/2018   Procedure: EXCISION OF RIGHT FOREARM MASS;  Surgeon: Clovis Riley, MD;  Location: [facility];  Service: General;  Laterality: Right;   SQUAMOUS CELL CARCINOMA EXCISION     TEE WITHOUT CARDIOVERSION N/A 06/04/2016   Procedure: TRANSESOPHAGEAL ECHOCARDIOGRAM (TEE);  Surgeon: Pixie Casino, MD;  Location: [facility];  Service: Cardiovascular;  Laterality: N/A;  FAMHx:  Family History  Problem Relation Age of Onset   Stroke Mother    Hypertension Mother    Alzheimer's disease Father    Heart failure Father    Down syndrome Daughter     SOCHx:   reports that he has never smoked. He has never used smokeless tobacco. He reports that he does not drink alcohol and does not use drugs.  ALLERGIES:  Allergies  Allergen Reactions   Other Other (See Comments)   No Known Allergies     ROS: Pertinent items noted in HPI and remainder of comprehensive ROS otherwise negative.  HOME MEDS: Current Outpatient Medications  Medication Sig Dispense Refill   atorvastatin (LIPITOR) 10 MG tablet Take 1 tablet (10 mg total) by mouth daily. 90 tablet 0   cyanocobalamin 1000 MCG tablet Take 1,000 mcg by mouth daily.     denosumab (PROLIA) 60 MG/ML SOSY injection Inject 60 mg into the skin every 6 (six) months.     ergocalciferol (VITAMIN D2) 1.25 MG (50000 UT) capsule Take 1 capsule (50,000 Units total) by mouth once a week. 12 capsule 5   FREESTYLE LITE test strip      glucose monitoring kit (FREESTYLE) monitoring kit 1 each by Does not apply route as needed for other.     Lancets (FREESTYLE) lancets 1 each daily.     lenalidomide (REVLIMID) 5 MG capsule TAKE 1 CAPSULE DAILY FOR 21 DAYS ON THEN 7 DAYS OFF. REPEAT EVERY 28 DAYS. 21 capsule 0   Magnesium 400 MG CAPS Take 400 mg by mouth daily.     metFORMIN (GLUCOPHAGE) 1000 MG tablet TAKE ONE-HALF (1/2) TABLET DAILY WITH BREAKFAST 90 tablet 3   nitroGLYCERIN (NITROSTAT) 0.4 MG SL tablet Place 1 tablet (0.4 mg total) under the tongue  every 5 (five) minutes as needed for chest pain (Max 3 doses in 15 mins.). 30 tablet 0   omeprazole (PRILOSEC) 20 MG capsule Take 20 mg by mouth every morning.      Potassium 99 MG TABS 1 tablet     pregabalin (LYRICA) 75 MG capsule Take 75 mg by mouth 3 (three) times daily.     XARELTO 20 MG TABS tablet TAKE 1 TABLET DAILY WITH SUPPER 90 tablet 3   denosumab (PROLIA) 60 MG/ML SOSY injection See admin instructions.     No current facility-administered medications for this visit.    LABS/IMAGING: No results found for this or any previous visit (from the past 48 hour(s)). No results found.  VITALS: BP 122/66 (BP Location: Left Arm, Patient Position: Sitting, Cuff Size: Large)   Pulse (!) 48   Ht '5\' 8"'$  (1.727 m)   Wt 193 lb 9.6 oz (87.8 kg)   SpO2 98%   BMI 29.44 kg/m   EXAM: General appearance: alert and no distress Neck: no carotid bruit, no JVD, and thyroid not enlarged, symmetric, no tenderness/mass/nodules Lungs: clear to auscultation bilaterally Heart: regular rate and rhythm Abdomen: soft, non-tender; bowel sounds normal; no masses,  no organomegaly Extremities: extremities normal, atraumatic, no cyanosis or edema Pulses: 2+ and symmetric Skin: Skin color, texture, turgor normal. No rashes or lesions Neurologic: Grossly normal  EKG: Sinus bradycardia at 48 -personally reviewed  ASSESSMENT: Chest pain/stable angina -nonobstructive coronary artery disease by cardiac CT, calcium score 229 (09/2018) Stroke/TIA PAF - brief, found on ILR - on Xarelto Small bidirectional PFO by TEE -on long-term Xarelto Mild CAD - coronary CT angiogram showed mild to moderate coronary disease which was nonobstructive and an  intermediate coronary calcium score of 202 (2016) PSVT s/p ablation by Dr. Lovena Le Diabetes type 2 -controlled Relative dyslipidemia Multiple myeloma -in remission Skin cancer  PLAN: 1.   Mr. Kissler seems to be doing well without chest pain or worsening shortness of  breath.  He is noted to have some sinus bradycardia again today with heart rate in the 40s but he says it does increase with exercise.  He is generally exercising once a week but plans to increase that.  His cholesterol is well treated although he seems like he may be intolerant to his statin.  He had tried a 2-week statin holiday and noticed his cramping had improved.  I advise stopping Lipitor and will go with a lower potency and low-dose statin, pravastatin 10 mg daily.  He should alert me as to how this is tolerated.  Otherwise monitor heart rate again with exercise and activity.  Plan follow-up with me annually or sooner as necessary.  Follow-up annually or sooner as necessary.  Pixie Casino, MD, [facility], [place] Director of the [facility] Lipid [facility]  Cardiovascular [facility] Diplomate of the American Board of Clinical Lipidology Attending Cardiologist  Direct Dial: [phone]  Fax: [phone]  Website:  [email or web address] 01/13/2023, 10:49 AM

## 2023-01-14 DIAGNOSIS — M79605 Pain in left leg: Secondary | ICD-10-CM | POA: Diagnosis not present

## 2023-01-14 DIAGNOSIS — M79604 Pain in right leg: Secondary | ICD-10-CM | POA: Diagnosis not present

## 2023-01-14 DIAGNOSIS — R269 Unspecified abnormalities of gait and mobility: Secondary | ICD-10-CM | POA: Diagnosis not present

## 2023-01-14 DIAGNOSIS — R531 Weakness: Secondary | ICD-10-CM | POA: Diagnosis not present

## 2023-01-15 DIAGNOSIS — S61019A Laceration without foreign body of unspecified thumb without damage to nail, initial encounter: Secondary | ICD-10-CM | POA: Diagnosis not present

## 2023-01-21 DIAGNOSIS — R531 Weakness: Secondary | ICD-10-CM | POA: Diagnosis not present

## 2023-01-21 DIAGNOSIS — R269 Unspecified abnormalities of gait and mobility: Secondary | ICD-10-CM | POA: Diagnosis not present

## 2023-01-21 DIAGNOSIS — M79605 Pain in left leg: Secondary | ICD-10-CM | POA: Diagnosis not present

## 2023-01-21 DIAGNOSIS — M79604 Pain in right leg: Secondary | ICD-10-CM | POA: Diagnosis not present

## 2023-01-28 ENCOUNTER — Other Ambulatory Visit: Payer: Self-pay

## 2023-01-28 DIAGNOSIS — C9001 Multiple myeloma in remission: Secondary | ICD-10-CM

## 2023-01-28 DIAGNOSIS — M79604 Pain in right leg: Secondary | ICD-10-CM | POA: Diagnosis not present

## 2023-01-28 DIAGNOSIS — R531 Weakness: Secondary | ICD-10-CM | POA: Diagnosis not present

## 2023-01-28 DIAGNOSIS — M79605 Pain in left leg: Secondary | ICD-10-CM | POA: Diagnosis not present

## 2023-01-28 DIAGNOSIS — R269 Unspecified abnormalities of gait and mobility: Secondary | ICD-10-CM | POA: Diagnosis not present

## 2023-01-28 MED ORDER — LENALIDOMIDE 5 MG PO CAPS: ORAL_CAPSULE | ORAL | 0 refills | Status: DC

## 2023-02-01 ENCOUNTER — Other Ambulatory Visit: Payer: Self-pay | Admitting: Internal Medicine

## 2023-02-03 ENCOUNTER — Other Ambulatory Visit: Payer: Self-pay

## 2023-02-03 DIAGNOSIS — C9000 Multiple myeloma not having achieved remission: Secondary | ICD-10-CM

## 2023-02-03 NOTE — Telephone Encounter (Signed)
Pt last saw Dr Debara Pickett 01/13/23, last labs 11/13/22 Creat 1.16, age 82, weight 87.8, CrCl 60.97, based on CrCl pt is on appropriate dosage of Xarelto 63m QD.  Will refill rx.

## 2023-02-04 ENCOUNTER — Other Ambulatory Visit: Payer: Self-pay

## 2023-02-04 ENCOUNTER — Inpatient Hospital Stay: Payer: Medicare Other | Attending: Hematology

## 2023-02-04 DIAGNOSIS — Z7901 Long term (current) use of anticoagulants: Secondary | ICD-10-CM | POA: Insufficient documentation

## 2023-02-04 DIAGNOSIS — D649 Anemia, unspecified: Secondary | ICD-10-CM | POA: Diagnosis not present

## 2023-02-04 DIAGNOSIS — I480 Paroxysmal atrial fibrillation: Secondary | ICD-10-CM | POA: Diagnosis not present

## 2023-02-04 DIAGNOSIS — M810 Age-related osteoporosis without current pathological fracture: Secondary | ICD-10-CM | POA: Insufficient documentation

## 2023-02-04 DIAGNOSIS — Z8673 Personal history of transient ischemic attack (TIA), and cerebral infarction without residual deficits: Secondary | ICD-10-CM | POA: Insufficient documentation

## 2023-02-04 DIAGNOSIS — E538 Deficiency of other specified B group vitamins: Secondary | ICD-10-CM | POA: Diagnosis not present

## 2023-02-04 DIAGNOSIS — G629 Polyneuropathy, unspecified: Secondary | ICD-10-CM | POA: Diagnosis not present

## 2023-02-04 DIAGNOSIS — C9000 Multiple myeloma not having achieved remission: Secondary | ICD-10-CM | POA: Diagnosis not present

## 2023-02-04 DIAGNOSIS — D696 Thrombocytopenia, unspecified: Secondary | ICD-10-CM | POA: Diagnosis not present

## 2023-02-04 LAB — CMP (CANCER CENTER ONLY)
ALT: 12 U/L (ref 0–44)
AST: 19 U/L (ref 15–41)
Albumin: 4 g/dL (ref 3.5–5.0)
Alkaline Phosphatase: 59 U/L (ref 38–126)
Anion gap: 6 (ref 5–15)
BUN: 21 mg/dL (ref 8–23)
CO2: 27 mmol/L (ref 22–32)
Calcium: 9.1 mg/dL (ref 8.9–10.3)
Chloride: 106 mmol/L (ref 98–111)
Creatinine: 1.31 mg/dL — ABNORMAL HIGH (ref 0.61–1.24)
GFR, Estimated: 54 mL/min — ABNORMAL LOW (ref >60)
Glucose, Bld: 161 mg/dL — ABNORMAL HIGH (ref 70–99)
Potassium: 4.3 mmol/L (ref 3.5–5.1)
Sodium: 139 mmol/L (ref 135–145)
Total Bilirubin: 0.7 mg/dL (ref 0.3–1.2)
Total Protein: 6.8 g/dL (ref 6.5–8.1)

## 2023-02-04 LAB — CBC WITH DIFFERENTIAL (CANCER CENTER ONLY)
Abs Immature Granulocytes: 0 10*3/uL (ref 0.00–0.07)
Basophils Absolute: 0.1 10*3/uL (ref 0.0–0.1)
Basophils Relative: 2 %
Eosinophils Absolute: 0.2 10*3/uL (ref 0.0–0.5)
Eosinophils Relative: 6 %
HCT: 41.1 % (ref 39.0–52.0)
Hemoglobin: 13.8 g/dL (ref 13.0–17.0)
Immature Granulocytes: 0 %
Lymphocytes Relative: 30 %
Lymphs Abs: 0.9 10*3/uL (ref 0.7–4.0)
MCH: 30.1 pg (ref 26.0–34.0)
MCHC: 33.6 g/dL (ref 30.0–36.0)
MCV: 89.5 fL (ref 80.0–100.0)
Monocytes Absolute: 0.3 10*3/uL (ref 0.1–1.0)
Monocytes Relative: 9 %
Neutro Abs: 1.5 10*3/uL — ABNORMAL LOW (ref 1.7–7.7)
Neutrophils Relative %: 53 %
Platelet Count: 129 10*3/uL — ABNORMAL LOW (ref 150–400)
RBC: 4.59 MIL/uL (ref 4.22–5.81)
RDW: 14.9 % (ref 11.5–15.5)
WBC Count: 2.9 10*3/uL — ABNORMAL LOW (ref 4.0–10.5)
nRBC: 0 % (ref 0.0–0.2)

## 2023-02-05 DIAGNOSIS — M79604 Pain in right leg: Secondary | ICD-10-CM | POA: Diagnosis not present

## 2023-02-05 DIAGNOSIS — R531 Weakness: Secondary | ICD-10-CM | POA: Diagnosis not present

## 2023-02-05 DIAGNOSIS — M79605 Pain in left leg: Secondary | ICD-10-CM | POA: Diagnosis not present

## 2023-02-05 DIAGNOSIS — R269 Unspecified abnormalities of gait and mobility: Secondary | ICD-10-CM | POA: Diagnosis not present

## 2023-02-05 LAB — KAPPA/LAMBDA LIGHT CHAINS
Kappa free light chain: 35.7 mg/L — ABNORMAL HIGH (ref 3.3–19.4)
Kappa, lambda light chain ratio: 1.18 (ref 0.26–1.65)
Lambda free light chains: 30.3 mg/L — ABNORMAL HIGH (ref 5.7–26.3)

## 2023-02-10 LAB — MULTIPLE MYELOMA PANEL, SERUM
Albumin SerPl Elph-Mcnc: 3.7 g/dL (ref 2.9–4.4)
Albumin/Glob SerPl: 1.3 (ref 0.7–1.7)
Alpha 1: 0.2 g/dL (ref 0.0–0.4)
Alpha2 Glob SerPl Elph-Mcnc: 0.7 g/dL (ref 0.4–1.0)
B-Globulin SerPl Elph-Mcnc: 1 g/dL (ref 0.7–1.3)
Gamma Glob SerPl Elph-Mcnc: 1.1 g/dL (ref 0.4–1.8)
Globulin, Total: 3 g/dL (ref 2.2–3.9)
IgA: 131 mg/dL (ref 61–437)
IgG (Immunoglobin G), Serum: 1100 mg/dL (ref 603–1613)
IgM (Immunoglobulin M), Srm: 25 mg/dL (ref 15–143)
M Protein SerPl Elph-Mcnc: 0.1 g/dL — ABNORMAL HIGH
Total Protein ELP: 6.7 g/dL (ref 6.0–8.5)

## 2023-02-11 ENCOUNTER — Inpatient Hospital Stay (HOSPITAL_BASED_OUTPATIENT_CLINIC_OR_DEPARTMENT_OTHER): Payer: Medicare Other | Admitting: Hematology

## 2023-02-11 ENCOUNTER — Other Ambulatory Visit: Payer: Self-pay

## 2023-02-11 VITALS — BP 102/91 | HR 54 | Temp 97.0°F | Resp 18 | Wt 195.9 lb

## 2023-02-11 DIAGNOSIS — D649 Anemia, unspecified: Secondary | ICD-10-CM | POA: Diagnosis not present

## 2023-02-11 DIAGNOSIS — C9000 Multiple myeloma not having achieved remission: Secondary | ICD-10-CM | POA: Diagnosis not present

## 2023-02-11 DIAGNOSIS — M810 Age-related osteoporosis without current pathological fracture: Secondary | ICD-10-CM

## 2023-02-11 DIAGNOSIS — I480 Paroxysmal atrial fibrillation: Secondary | ICD-10-CM | POA: Diagnosis not present

## 2023-02-11 DIAGNOSIS — D696 Thrombocytopenia, unspecified: Secondary | ICD-10-CM | POA: Diagnosis not present

## 2023-02-11 DIAGNOSIS — G629 Polyneuropathy, unspecified: Secondary | ICD-10-CM | POA: Diagnosis not present

## 2023-02-11 NOTE — Progress Notes (Signed)
HEMATOLOGY/ONCOLOGY CLINIC NOTE  Date of Service: 02/11/23    Patient Care Team: Mayra Neer, MD as PCP - General (Family Medicine) Debara Pickett Nadean Corwin, MD as PCP - Cardiology (Cardiology)   CHIEF COMPLAINTS:  Follow-up for continued evaluation and management of multiple myeloma  HISTORY OF PRESENTING ILLNESS:  Please see previous note for details of initial presentation  CURRENT THERAPY:   Revlimid maintenance 5 mg po 3 weeks on 1 week off.   INTERVAL HISTORY:  Justin [name] is here for continued evaluation and management of multiple myeloma. He is currently on maintenance of Revlimid 5 mg.    Patient was last seen by me on 11/20/2022 and he complained of increased memory problems, mild lower back pain, and new skin rashes on his forehead and face. He reported he had more squamous cell carcinomas removed and has been following up with his Dermatologist.    Patient reports he has been doing well overall without any new medical concerns since our last visit. He denies fever, chills, night sweats, infection issues, abnormal bowel movement, chest pain, back pain, abdominal pain, or leg swelling.   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 He is regularly following up with his Dermatologist regarding squamous cell carcinoma.   Patient was having bilateral leg muscle cramps due to Atorvastatin, which was changed to Pravastatin 10 mg. He does not have any muscle cramps with Pravastatin.   Patient also complains of occasional bilateral hand finger freezing and mild eye problem. He is following up with his Ophthalmologist regarding his eye problem. He denies vision changes.   Patient still complains of continuous bilateral weakness and chronic lower back pain. He is trying to stay physically active and exercise to help his back pain and bilateral leg weakness.   He also reports of increased memory problems due to  age. He notes that his increased memory problems have been causing some daily problems. Patient notes he is following up with his neurologist regarding memory problems.   He is regularly taking Revlimid 5 mg as prescribed without any new or severe toxicities.   He regularly takes B-12 supplement and Vitamin D-2 and D-3 supplements.   He has received influenza vaccine, COVID-19 Booster, and RSV vaccine. He is also up to speed with all other age appropriate vaccines.   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 s/p RFCA on 05/01/15   Stroke ([place]) 2017   TIA and mild stroke; denies stroke symptoms since then     SURGICAL HISTORY: Past Surgical History:  Procedure Laterality Date   ATRIAL FIBRILLATION ABLATION     Dr. Lovena Le   BASAL CELL CARCINOMA EXCISION     off of back   ELECTROPHYSIOLOGIC STUDY N/A 05/01/2015   Procedure: SVT Ablation;  Surgeon: Evans Lance, MD;  Location: [facility];  Service: Cardiovascular;  Laterality: N/A;   EP IMPLANTABLE DEVICE N/A 06/04/2016   Procedure: Loop Recorder Insertion;  Surgeon: Thompson Grayer, MD;  Location: [facility];  Service: Cardiovascular;  Laterality: N/A;   implantable loop recorder removal     ILR removed by Dr Rayann Heman   INGUINAL HERNIA REPAIR Bilateral 06/16/2017   Procedure: LAPAROSCOPIC BILATERAL INGUINAL HERNIA REPAIR;  Surgeon: Clovis Riley, MD;  Location: [facility];  Service: General;  Laterality: Bilateral;   INSERTION OF MESH Bilateral 06/16/2017   Procedure: INSERTION OF MESH;  Surgeon: Clovis Riley, MD;  Location: [facility];  Service: General;   Laterality: Bilateral;   MASS EXCISION Right 07/28/2018   Procedure: EXCISION OF RIGHT FOREARM MASS;  Surgeon: Clovis Riley, MD;  Location: [facility];  Service: General;  Laterality: Right;   SQUAMOUS CELL CARCINOMA EXCISION     TEE WITHOUT CARDIOVERSION N/A 06/04/2016   Procedure: TRANSESOPHAGEAL ECHOCARDIOGRAM (TEE);  Surgeon: Pixie Casino, MD;  Location: [facility];  Service: Cardiovascular;  Laterality: N/A;    SOCIAL HISTORY: Social History   Socioeconomic History   Marital status: Married    Spouse name: Not on file   Number of children: 3   Years of education: 16   Highest education level: Not on file  Occupational History   Not on file  Tobacco Use   Smoking status: Never   Smokeless tobacco: Never  Vaping Use   Vaping Use: Never used  Substance and Sexual Activity   Alcohol use: No   Drug use: No   Sexual activity: Not on file  Other Topics Concern   Not on file  Social History Narrative   Right handed   No caffeine   Two story home   Retired   Art therapist   Social Determinants of Radio broadcast assistant Strain: Not on file  Food Insecurity: Not on file  Transportation Needs: Not on file  Physical Activity: Not on file  Stress: Not on file  Social Connections: Not on file  Intimate Partner Violence: Not on file   Justin [name] is retired. His daughter is Mudlogger of nursing for a nursing home in [place], [place].    FAMILY HISTORY: Family History  Problem Relation Age of Onset   Stroke Mother    Hypertension Mother    Alzheimer's disease Father    Heart failure Father    Down syndrome Daughter     ALLERGIES:  is allergic to other and no known allergies.  MEDICATIONS:  Current Outpatient Medications  Medication Sig Dispense Refill   cyanocobalamin 1000 MCG tablet Take 1,000 mcg by mouth daily.     denosumab (PROLIA) 60 MG/ML SOSY injection See admin instructions.     denosumab (PROLIA) 60 MG/ML SOSY injection Inject 60 mg into the skin  every 6 (six) months.     ergocalciferol (VITAMIN D2) 1.25 MG (50000 UT) capsule Take 1 capsule (50,000 Units total) by mouth once a week. 12 capsule 5   FREESTYLE LITE test strip      glucose monitoring kit (FREESTYLE) monitoring kit 1 each by Does not apply route as needed for other.     Lancets (FREESTYLE) lancets 1 each daily.     lenalidomide (REVLIMID) 5 MG capsule TAKE 1 CAPSULE DAILY FOR 21 DAYS ON THEN 7 DAYS OFF. REPEAT EVERY 28 DAYS. 21 capsule 0   Magnesium 400 MG CAPS Take 400 mg by mouth daily.     metFORMIN (GLUCOPHAGE) 1000 MG tablet TAKE ONE-HALF (1/2) TABLET DAILY WITH BREAKFAST 90 tablet 3   nitroGLYCERIN (NITROSTAT) 0.4 MG SL tablet Place 1 tablet (0.4 mg total)  under the tongue every 5 (five) minutes as needed for chest pain (Max 3 doses in 15 mins.). 30 tablet 0   omeprazole (PRILOSEC) 20 MG capsule Take 20 mg by mouth every morning.      Potassium 99 MG TABS 1 tablet     pravastatin (PRAVACHOL) 10 MG tablet Take 1 tablet (10 mg total) by mouth daily. 90 tablet 3   pregabalin (LYRICA) 75 MG capsule Take 75 mg by mouth 3 (three) times daily.     rivaroxaban (XARELTO) 20 MG TABS tablet TAKE 1 TABLET DAILY WITH SUPPER 90 tablet 2   No current facility-administered medications for this visit.    REVIEW OF SYSTEMS:   10 Point review of Systems was done is negative except as noted above.  PHYSICAL EXAMINATION:   ECOG PERFORMANCE STATUS: 2 - Symptomatic, <50% confined to bed Vitals:   02/11/23 1200  BP: (!) 102/91  Pulse: (!) 54  Resp: 18  Temp: (!) 97 F (36.1 C)  SpO2: 100%   Wt Readings from Last 3 Encounters:  01/13/23 193 lb 9.6 oz (87.8 kg)  01/02/23 198 lb (89.8 kg)  11/20/22 193 lb 14.4 oz (88 kg)   There is no height or weight on file to calculate BMI.  NAD GENERAL:alert, in no acute distress and comfortable SKIN: no acute rashes, no significant lesions EYES: conjunctiva are pink and non-injected, sclera anicteric OROPHARYNX: MMM, no exudates, no  oropharyngeal erythema or ulceration NECK: supple, no JVD LYMPH:  no palpable lymphadenopathy in the cervical, axillary or inguinal regions LUNGS: clear to auscultation b/l with normal respiratory effort HEART: regular rate & rhythm ABDOMEN:  normoactive bowel sounds , non tender, not distended. Extremity: no pedal edema PSYCH: alert & oriented x 3 with fluent speech NEURO: no focal motor/sensory deficits  LABORATORY DATA:  I have reviewed the data as listed.     Latest Ref Rng & Units 02/04/2023   11:52 AM 11/13/2022    8:38 AM 07/01/2022   10:17 AM  CBC  WBC 4.0 - 10.5 K/uL 2.9  2.6  2.4   Hemoglobin 13.0 - 17.0 g/dL 13.8  12.1  12.9   Hematocrit 39.0 - 52.0 % 41.1  36.1  37.9   Platelets 150 - 400 K/uL 129  110  100   ANC 1.5k     Latest Ref Rng & Units 02/04/2023   11:52 AM 11/13/2022    8:38 AM 07/01/2022   10:17 AM  CMP  Glucose 70 - 99 mg/dL 161  131  162   BUN 8 - 23 mg/dL '21  19  21   '$ Creatinine 0.61 - 1.24 mg/dL 1.31  1.16  1.19   Sodium 135 - 145 mmol/L 139  139  136   Potassium 3.5 - 5.1 mmol/L 4.3  4.1  3.9   Chloride 98 - 111 mmol/L 106  108  104   CO2 22 - 32 mmol/L '27  25  27   '$ Calcium 8.9 - 10.3 mg/dL 9.1  8.8  8.8   Total Protein 6.5 - 8.1 g/dL 6.8  6.6  6.6   Total Bilirubin 0.3 - 1.2 mg/dL 0.7  1.0  1.1   Alkaline Phos 38 - 126 U/L 59  47  53   AST 15 - 41 U/L '19  20  18   '$ ALT 0 - 44 U/L '12  12  9         '$ RADIOGRAPHIC STUDIES: I have personally reviewed the radiological images as  listed and agreed with the findings in the report.       Bone Marrow Biopsy 12/24/2016 (Accession [id number])  Diagnosis Bone Marrow, Aspirate,Biopsy, and Clot, left iliac crest - HYPERCELLULAR BONE MARROW FOR AGE WITH PLASMA CELL NEOPLASM. - SEE COMMENT. PERIPHERAL BLOOD: - NORMOCYTIC-NORMOCHROMIC ANEMIA. - LEUKOPENIA.  DG Bone Survey Met (Accession [id number]) (Order [id number])  Imaging  Date: 11/28/2016 Department: [facility] Released By: Pricilla Riffle Authorizing: Brunetta Genera, MD  Exam Information   Status Exam Begun  Exam Ended   Final [99] 11/28/2016 11:59 AM 11/28/2016 12:36 PM  PACS Images   Show images for DG Bone Survey Met  Study Result   CLINICAL DATA:  Monoclonal gammopathy of unknown significance. History of squamous cell carcinoma excision, basal cell carcinoma excision.   EXAM: METASTATIC BONE SURVEY   COMPARISON:  CT neck, chest, abdomen and pelvis from 10/29/16, MRI of the head from 06/01/2016, CXR 10/27/2016, lumbar spine radiographs 06/24/2016   FINDINGS: Lateral skull: Small occipital lucency which may represent a normal arachnoid granulation posteriorly in the occiput. No definite lytic abnormality.   Cervical spine AP and lateral: Disc space narrowing at C2-3, and from C4 through C7. C4-5, C5-6 and C6-7 uncovertebral joint spurring bilaterally. No lytic abnormality.   Thoracic spine AP and lateral: T8-9 right-sided osteophytes and to a lesser degree T9-10. No lytic abnormality. Slight multilevel lumbar thoracic disc space narrowing likely degenerative.   Lumbar spine AP and lateral: Disc space narrowing at L4-5 and to a greater extent L5-S1. No lytic disease. L3 through S1 facet sclerosis.   AP pelvis: Negative for lytic disease.   Bilateral upper and lower extremities shoulders through wrist and from the hips through ankle: Negative for lytic disease. Joint space narrowing of the femorotibial compartments both knees. Subchondral cyst of the right patella.   CXR: Clear lungs. Cardiac implantable monitoring device projects over the left heart. Aortic atherosclerosis. No lytic disease.   IMPRESSION: No findings suspicious for lytic disease.     Electronically Signed   By: Ashley Royalty M.D.   On: 11/28/2016 14:58     ASSESSMENT & PLAN:   82 y.o. 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 (12/2016) Peak M spike 2.6  Cytogenetics and Myeloma FISH panel.- trisomy 11 Patient has normocytic anemia without any other clear etiology. (>2g/dl lower that lower limit of normal which is 13) which per criteria would place this in the Multiple myeloma category as opposed to Smoldering Multiple myeloma (Borderline criterion)  No overt renal failure or hypercalcemia at this time No focal bone pains though he has significant chronic back pain related to degenerative disc disease. Bone survey shows no overt lytic lesions.  10/16/18 CT Coronary Morph revealed Coronary artery calcium score 299 Agatston units, this places the patient in the 47th percentile for age and gender, suggesting intermediate risk for future cardiac events. 2. Nonobstructive coronary disease.  12/08/18 Bone Density study revealed that the pt has osteoporosis  2) Anemia and thrombocytopenia -- related to treatment (Revlimid) -stable  3) Grade 1 Neuropathy/radiculopathy-- in b/l feet and halfway up lower leg without pain  4) Borderline low B12 levels, previously 299 --- on replacement -  -continue replacement.  5) h/o recurrent CVA - thought to be related to small vessel disease as per neurology. Has a small PFO which could be an additional risk factor as well as his  afib  6)  P afib Plan -Continue on Xarelto per cardiology.  7) H/o recurrent SCC -Underwent surgical excision on right forearm with Dr. Romana Juniper on 07/28/18. His surgical pathology showed evidence of Squamous Cell Carcinoma. Plan -continue dermatology followup  10) Lower extremity discomfort - improved, stable. Likely Discogenic pain -[place] on 12/2017 revealed no blood clot -Pt has established care with orthopedist and PT. - has had muscle cramps from his statins and this is being mx by his cardiologist.  11) Patient Active Problem List   Diagnosis Date Noted   Mild cognitive impairment 10/03/2022    Osteoporosis 03/10/2019   Multiple myeloma without remission ([place]) 03/10/2019   Chest pain 09/27/2018   Multiple myeloma ([place]) 09/27/2018   Degeneration of lumbar intervertebral disc 01/30/2018   Low back pain 01/30/2018   Lumbar radiculopathy 01/30/2018   History of loop recorder 06/12/2017   Cryptogenic stroke ([place]) 10/28/2016   Gait abnormality    History of CVA (cerebrovascular accident)    History of TIA (transient ischemic attack)    Benign essential HTN    Paroxysmal SVT (supraventricular tachycardia)    Acute blood loss anemia    Acute ischemic stroke (HCC)    CVA (cerebral vascular accident) ([place]) 10/26/2016   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   History of recent stroke 06/06/2016   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 CBC shows decreased WBC of 2.9 and decreased platelets of 129. CMP shows elevated creatine of 1.31. Myeloma panel shows stable M-spike at 0.1. Patient is dehydrated.  -Recommended to stay hydrated. Drink at least 2 L of water everyday.  -recommended to eat well.  - no prohibitive toxicities from Revlimid maintenance at this time. -Patient has no lab or clinical evidence of myeloma progression at this time. -Continue Revlimid 5 mg as prescribed.  -Answered all of patient's questions.  -Continue with Prolia every 6 months for osteoporosis   FOLLOW UP: Return to clinic with Dr. Irene Limbo in 12 weeks Labs in 11 weeks PLz schedule next 2 doses of  Prolia every 6 months    The total time spent in the appointment was 30 minutes* .  All of the patient's questions were answered with apparent satisfaction. The patient knows to call the clinic with any problems, questions or concerns.   Sullivan Lone MD MS AAHIVMS [facility] Hematology/Oncology Physician [facility]  .*Total Encounter Time as defined by the Centers for Medicare and Medicaid Services includes, in addition to the face-to-face time of a patient visit (documented in the note above) non-face-to-face time: obtaining and reviewing outside history, ordering and reviewing medications, tests or procedures, care coordination (communications with other health care professionals or caregivers) and documentation in the medical record.   I, Cleda Mccreedy, am acting as a Education administrator for Sullivan Lone, MD. .I have reviewed the above documentation for accuracy and completeness, and I agree with the above. Brunetta Genera MD

## 2023-02-12 DIAGNOSIS — E119 Type 2 diabetes mellitus without complications: Secondary | ICD-10-CM | POA: Diagnosis not present

## 2023-02-12 DIAGNOSIS — H04123 Dry eye syndrome of bilateral lacrimal glands: Secondary | ICD-10-CM | POA: Diagnosis not present

## 2023-02-12 DIAGNOSIS — H43813 Vitreous degeneration, bilateral: Secondary | ICD-10-CM | POA: Diagnosis not present

## 2023-02-12 DIAGNOSIS — H26493 Other secondary cataract, bilateral: Secondary | ICD-10-CM | POA: Diagnosis not present

## 2023-02-13 DIAGNOSIS — R269 Unspecified abnormalities of gait and mobility: Secondary | ICD-10-CM | POA: Diagnosis not present

## 2023-02-13 DIAGNOSIS — M79604 Pain in right leg: Secondary | ICD-10-CM | POA: Diagnosis not present

## 2023-02-13 DIAGNOSIS — M79605 Pain in left leg: Secondary | ICD-10-CM | POA: Diagnosis not present

## 2023-02-13 DIAGNOSIS — R531 Weakness: Secondary | ICD-10-CM | POA: Diagnosis not present

## 2023-02-17 ENCOUNTER — Encounter: Payer: Self-pay | Admitting: Hematology

## 2023-02-20 DIAGNOSIS — R269 Unspecified abnormalities of gait and mobility: Secondary | ICD-10-CM | POA: Diagnosis not present

## 2023-02-20 DIAGNOSIS — M79605 Pain in left leg: Secondary | ICD-10-CM | POA: Diagnosis not present

## 2023-02-20 DIAGNOSIS — R531 Weakness: Secondary | ICD-10-CM | POA: Diagnosis not present

## 2023-02-20 DIAGNOSIS — M79604 Pain in right leg: Secondary | ICD-10-CM | POA: Diagnosis not present

## 2023-02-25 ENCOUNTER — Other Ambulatory Visit (HOSPITAL_COMMUNITY): Payer: Self-pay

## 2023-02-25 ENCOUNTER — Telehealth: Payer: Self-pay | Admitting: Pharmacy Technician

## 2023-02-25 ENCOUNTER — Encounter: Payer: Self-pay | Admitting: Hematology

## 2023-02-25 NOTE — Telephone Encounter (Signed)
Oral Oncology Patient Advocate Encounter   Received notification that prior authorization for Lenalidomide is due for renewal.   PA submitted on 02/25/23 Key [id number] Status is pending     Justin [name], [place] Patient [place] Direct Number: [phone]  Fax: [phone]

## 2023-02-25 NOTE — Telephone Encounter (Signed)
Oral Oncology Patient Advocate Encounter  Prior Authorization for Revlimid (Lenalidomide) has been approved.    PA# [id number] Effective dates: 01/26/23 through 12/22/98  Patient must fill at [facility].    Lady Deutscher, CPhT-Adv Oncology Pharmacy Patient [place] Direct Number: [phone]  Fax: [phone]

## 2023-02-26 ENCOUNTER — Other Ambulatory Visit: Payer: Self-pay

## 2023-02-26 DIAGNOSIS — C9001 Multiple myeloma in remission: Secondary | ICD-10-CM

## 2023-02-26 MED ORDER — LENALIDOMIDE 5 MG PO CAPS: ORAL_CAPSULE | ORAL | 0 refills | Status: DC

## 2023-02-27 ENCOUNTER — Other Ambulatory Visit: Payer: Self-pay | Admitting: Hematology

## 2023-02-27 ENCOUNTER — Telehealth: Payer: Self-pay | Admitting: Family Medicine

## 2023-02-27 DIAGNOSIS — M79605 Pain in left leg: Secondary | ICD-10-CM | POA: Diagnosis not present

## 2023-02-27 DIAGNOSIS — C9001 Multiple myeloma in remission: Secondary | ICD-10-CM

## 2023-02-27 DIAGNOSIS — M79604 Pain in right leg: Secondary | ICD-10-CM | POA: Diagnosis not present

## 2023-02-27 DIAGNOSIS — R269 Unspecified abnormalities of gait and mobility: Secondary | ICD-10-CM | POA: Diagnosis not present

## 2023-02-27 DIAGNOSIS — R531 Weakness: Secondary | ICD-10-CM | POA: Diagnosis not present

## 2023-02-27 NOTE — Telephone Encounter (Signed)
Called patient to get labs rescheduled, patient aware of date and  time change.

## 2023-03-13 DIAGNOSIS — M79604 Pain in right leg: Secondary | ICD-10-CM | POA: Diagnosis not present

## 2023-03-13 DIAGNOSIS — M79605 Pain in left leg: Secondary | ICD-10-CM | POA: Diagnosis not present

## 2023-03-13 DIAGNOSIS — R269 Unspecified abnormalities of gait and mobility: Secondary | ICD-10-CM | POA: Diagnosis not present

## 2023-03-13 DIAGNOSIS — R531 Weakness: Secondary | ICD-10-CM | POA: Diagnosis not present

## 2023-03-19 DIAGNOSIS — M79604 Pain in right leg: Secondary | ICD-10-CM | POA: Diagnosis not present

## 2023-03-19 DIAGNOSIS — R269 Unspecified abnormalities of gait and mobility: Secondary | ICD-10-CM | POA: Diagnosis not present

## 2023-03-19 DIAGNOSIS — M79605 Pain in left leg: Secondary | ICD-10-CM | POA: Diagnosis not present

## 2023-03-19 DIAGNOSIS — R531 Weakness: Secondary | ICD-10-CM | POA: Diagnosis not present

## 2023-03-23 ENCOUNTER — Other Ambulatory Visit: Payer: Self-pay | Admitting: Hematology

## 2023-03-23 DIAGNOSIS — C9001 Multiple myeloma in remission: Secondary | ICD-10-CM

## 2023-03-24 ENCOUNTER — Other Ambulatory Visit: Payer: Self-pay | Admitting: Surgery

## 2023-03-24 DIAGNOSIS — K869 Disease of pancreas, unspecified: Secondary | ICD-10-CM

## 2023-03-25 ENCOUNTER — Encounter: Payer: Self-pay | Admitting: Hematology

## 2023-03-25 ENCOUNTER — Other Ambulatory Visit: Payer: Self-pay

## 2023-04-01 DIAGNOSIS — M79604 Pain in right leg: Secondary | ICD-10-CM | POA: Diagnosis not present

## 2023-04-01 DIAGNOSIS — R269 Unspecified abnormalities of gait and mobility: Secondary | ICD-10-CM | POA: Diagnosis not present

## 2023-04-01 DIAGNOSIS — R531 Weakness: Secondary | ICD-10-CM | POA: Diagnosis not present

## 2023-04-01 DIAGNOSIS — M79605 Pain in left leg: Secondary | ICD-10-CM | POA: Diagnosis not present

## 2023-04-17 DIAGNOSIS — M79604 Pain in right leg: Secondary | ICD-10-CM | POA: Diagnosis not present

## 2023-04-17 DIAGNOSIS — R269 Unspecified abnormalities of gait and mobility: Secondary | ICD-10-CM | POA: Diagnosis not present

## 2023-04-17 DIAGNOSIS — R531 Weakness: Secondary | ICD-10-CM | POA: Diagnosis not present

## 2023-04-17 DIAGNOSIS — M79605 Pain in left leg: Secondary | ICD-10-CM | POA: Diagnosis not present

## 2023-04-22 ENCOUNTER — Other Ambulatory Visit: Payer: Self-pay | Admitting: Hematology

## 2023-04-22 ENCOUNTER — Ambulatory Visit
Admission: RE | Admit: 2023-04-22 | Discharge: 2023-04-22 | Disposition: A | Discharge status: Home / Self Care | Payer: Medicare Other | Source: Ambulatory Visit | Attending: Surgery | Admitting: Surgery

## 2023-04-22 ENCOUNTER — Other Ambulatory Visit: Payer: Medicare Other

## 2023-04-22 DIAGNOSIS — K869 Disease of pancreas, unspecified: Secondary | ICD-10-CM

## 2023-04-22 DIAGNOSIS — M79604 Pain in right leg: Secondary | ICD-10-CM | POA: Diagnosis not present

## 2023-04-22 DIAGNOSIS — R531 Weakness: Secondary | ICD-10-CM | POA: Diagnosis not present

## 2023-04-22 DIAGNOSIS — C9001 Multiple myeloma in remission: Secondary | ICD-10-CM

## 2023-04-22 DIAGNOSIS — R269 Unspecified abnormalities of gait and mobility: Secondary | ICD-10-CM | POA: Diagnosis not present

## 2023-04-22 DIAGNOSIS — M79605 Pain in left leg: Secondary | ICD-10-CM | POA: Diagnosis not present

## 2023-04-22 DIAGNOSIS — K8689 Other specified diseases of pancreas: Secondary | ICD-10-CM | POA: Diagnosis not present

## 2023-04-22 MED ORDER — GADOPICLENOL 0.5 MMOL/ML IV SOLN
10.0000 mL | Freq: Once | INTRAVENOUS | Status: AC | PRN
  Administered 2023-04-22: 10 mL via INTRAVENOUS

## 2023-04-23 ENCOUNTER — Other Ambulatory Visit: Payer: Self-pay

## 2023-04-23 ENCOUNTER — Encounter: Payer: Self-pay | Admitting: Hematology

## 2023-04-29 ENCOUNTER — Other Ambulatory Visit: Payer: Medicare Other

## 2023-04-30 ENCOUNTER — Other Ambulatory Visit: Payer: Self-pay

## 2023-04-30 DIAGNOSIS — M79605 Pain in left leg: Secondary | ICD-10-CM | POA: Diagnosis not present

## 2023-04-30 DIAGNOSIS — M79604 Pain in right leg: Secondary | ICD-10-CM | POA: Diagnosis not present

## 2023-04-30 DIAGNOSIS — R269 Unspecified abnormalities of gait and mobility: Secondary | ICD-10-CM | POA: Diagnosis not present

## 2023-04-30 DIAGNOSIS — R531 Weakness: Secondary | ICD-10-CM | POA: Diagnosis not present

## 2023-04-30 MED ORDER — PRAVASTATIN SODIUM 10 MG PO TABS: 10.0000 mg | ORAL_TABLET | Freq: Every day | ORAL | 2 refills | Status: DC

## 2023-05-05 ENCOUNTER — Other Ambulatory Visit: Payer: Self-pay | Admitting: *Deleted

## 2023-05-05 ENCOUNTER — Ambulatory Visit: Payer: Medicare Other | Admitting: Hematology

## 2023-05-05 DIAGNOSIS — C9000 Multiple myeloma not having achieved remission: Secondary | ICD-10-CM

## 2023-05-07 ENCOUNTER — Other Ambulatory Visit: Payer: Self-pay

## 2023-05-07 ENCOUNTER — Inpatient Hospital Stay: Payer: Medicare Other | Attending: Hematology

## 2023-05-07 DIAGNOSIS — C9000 Multiple myeloma not having achieved remission: Secondary | ICD-10-CM | POA: Insufficient documentation

## 2023-05-07 LAB — CMP (CANCER CENTER ONLY)
ALT: 8 U/L (ref 0–44)
AST: 19 U/L (ref 15–41)
Albumin: 3.9 g/dL (ref 3.5–5.0)
Alkaline Phosphatase: 54 U/L (ref 38–126)
Anion gap: 4 — ABNORMAL LOW (ref 5–15)
BUN: 20 mg/dL (ref 8–23)
CO2: 31 mmol/L (ref 22–32)
Calcium: 9 mg/dL (ref 8.9–10.3)
Chloride: 103 mmol/L (ref 98–111)
Creatinine: 1.3 mg/dL — ABNORMAL HIGH (ref 0.61–1.24)
GFR, Estimated: 55 mL/min — ABNORMAL LOW (ref >60)
Glucose, Bld: 125 mg/dL — ABNORMAL HIGH (ref 70–99)
Potassium: 4.5 mmol/L (ref 3.5–5.1)
Sodium: 138 mmol/L (ref 135–145)
Total Bilirubin: 0.7 mg/dL (ref 0.3–1.2)
Total Protein: 6.6 g/dL (ref 6.5–8.1)

## 2023-05-07 LAB — CBC WITH DIFFERENTIAL (CANCER CENTER ONLY)
Abs Immature Granulocytes: 0 10*3/uL (ref 0.00–0.07)
Basophils Absolute: 0.1 10*3/uL (ref 0.0–0.1)
Basophils Relative: 3 %
Eosinophils Absolute: 0.1 10*3/uL (ref 0.0–0.5)
Eosinophils Relative: 3 %
HCT: 40.2 % (ref 39.0–52.0)
Hemoglobin: 13.3 g/dL (ref 13.0–17.0)
Immature Granulocytes: 0 %
Lymphocytes Relative: 28 %
Lymphs Abs: 0.7 10*3/uL (ref 0.7–4.0)
MCH: 29.9 pg (ref 26.0–34.0)
MCHC: 33.1 g/dL (ref 30.0–36.0)
MCV: 90.3 fL (ref 80.0–100.0)
Monocytes Absolute: 0.2 10*3/uL (ref 0.1–1.0)
Monocytes Relative: 8 %
Neutro Abs: 1.5 10*3/uL — ABNORMAL LOW (ref 1.7–7.7)
Neutrophils Relative %: 58 %
Platelet Count: 132 10*3/uL — ABNORMAL LOW (ref 150–400)
RBC: 4.45 MIL/uL (ref 4.22–5.81)
RDW: 14.7 % (ref 11.5–15.5)
WBC Count: 2.6 10*3/uL — ABNORMAL LOW (ref 4.0–10.5)
nRBC: 0 % (ref 0.0–0.2)

## 2023-05-08 DIAGNOSIS — R531 Weakness: Secondary | ICD-10-CM | POA: Diagnosis not present

## 2023-05-08 DIAGNOSIS — R269 Unspecified abnormalities of gait and mobility: Secondary | ICD-10-CM | POA: Diagnosis not present

## 2023-05-08 DIAGNOSIS — M79605 Pain in left leg: Secondary | ICD-10-CM | POA: Diagnosis not present

## 2023-05-08 DIAGNOSIS — M79604 Pain in right leg: Secondary | ICD-10-CM | POA: Diagnosis not present

## 2023-05-08 LAB — KAPPA/LAMBDA LIGHT CHAINS
Kappa free light chain: 34.8 mg/L — ABNORMAL HIGH (ref 3.3–19.4)
Kappa, lambda light chain ratio: 1.19 (ref 0.26–1.65)
Lambda free light chains: 29.2 mg/L — ABNORMAL HIGH (ref 5.7–26.3)

## 2023-05-14 ENCOUNTER — Inpatient Hospital Stay (HOSPITAL_BASED_OUTPATIENT_CLINIC_OR_DEPARTMENT_OTHER): Payer: Medicare Other | Admitting: Hematology

## 2023-05-14 ENCOUNTER — Ambulatory Visit: Payer: Medicare Other

## 2023-05-14 ENCOUNTER — Inpatient Hospital Stay: Payer: Medicare Other

## 2023-05-14 ENCOUNTER — Other Ambulatory Visit: Payer: Self-pay

## 2023-05-14 VITALS — BP 141/71 | HR 50 | Temp 97.5°F | Resp 18 | Wt 199.6 lb

## 2023-05-14 DIAGNOSIS — C9000 Multiple myeloma not having achieved remission: Secondary | ICD-10-CM

## 2023-05-14 DIAGNOSIS — M818 Other osteoporosis without current pathological fracture: Secondary | ICD-10-CM

## 2023-05-14 MED ORDER — DENOSUMAB 60 MG/ML ~~LOC~~ SOSY
60.0000 mg | PREFILLED_SYRINGE | Freq: Once | SUBCUTANEOUS | Status: AC
  Administered 2023-05-14: 60 mg via SUBCUTANEOUS
  Filled 2023-05-14: qty 1

## 2023-05-14 NOTE — Progress Notes (Signed)
HEMATOLOGY/ONCOLOGY CLINIC NOTE  Date of Service: 05/14/23    Patient Care Team: Lupita Raider, MD as PCP - General (Family Medicine) Rennis Golden Lisette Abu, MD as PCP - Cardiology (Cardiology)   CHIEF COMPLAINTS:  Follow-up for continued evaluation and management of multiple myeloma  HISTORY OF PRESENTING ILLNESS:  Please see previous note for details of initial presentation  CURRENT THERAPY:   Revlimid maintenance 5 mg po 3 weeks on 1 week off.   INTERVAL HISTORY:  Justin [name] is a 82 y.o. male here for continued evaluation and management of multiple myeloma.   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 He complained of bilateral lower extremity muscle cramps with Atorvastatin which had been switched to Pravastatin 10 MG. He also complained of occasional bilateral freezing of his fingers and mild eye issue. Additionally, patient experienced continuous bilateral weakness, chronic lower back pain, and increased memory problems due to age.   Today, he reports that he has been feeling well overall. He reports no toxicity issues with taking Revlimid. He complains of neuropathy and continues to take Lyrica regularly. He does take a multivitamin and vitamin D3 supplement on a regular basis. He generally drinks 16 oz of water daily. He reports that his muscle cramps have improved with Pravastatin.  Patient continues to endorse memory issues with words/names and was seen by a neurologist and PCP in regards to this. He complains of bilateral LE weakness. Physical activity does improve symptoms and he does engage in physical therapy. He denies any recent infection issues or new dental issues.  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 s/p RFCA on 05/01/15   Stroke (HCC) 2017   TIA and mild stroke; denies stroke symptoms since then     SURGICAL HISTORY: Past Surgical History:  Procedure Laterality Date   ATRIAL FIBRILLATION ABLATION     Dr. Ladona Ridgel   BASAL CELL CARCINOMA EXCISION     off of back   ELECTROPHYSIOLOGIC STUDY N/A 05/01/2015   Procedure: SVT Ablation;  Surgeon: Marinus Maw, MD;  Location: [facility] INVASIVE CV LAB;  Service: Cardiovascular;  Laterality: N/A;   EP IMPLANTABLE DEVICE N/A 06/04/2016   Procedure: Loop Recorder Insertion;  Surgeon: Hillis Range, MD;  Location: [facility] INVASIVE CV LAB;  Service: Cardiovascular;  Laterality: N/A;   implantable loop recorder removal     ILR removed by Dr Johney Frame   INGUINAL HERNIA REPAIR Bilateral 06/16/2017   Procedure: LAPAROSCOPIC BILATERAL INGUINAL HERNIA REPAIR;  Surgeon: Berna Bue, MD;  Location: [facility] OR;  Service: General;  Laterality: Bilateral;   INSERTION OF MESH Bilateral 06/16/2017   Procedure: INSERTION OF MESH;  Surgeon: Berna Bue, MD;  Location: [facility] OR;  Service: General;  Laterality: Bilateral;   MASS EXCISION Right 07/28/2018   Procedure: EXCISION OF RIGHT FOREARM MASS;  Surgeon: Berna Bue, MD;  Location: [facility] ORS;  Service: General;  Laterality: Right;   SQUAMOUS CELL CARCINOMA EXCISION  TEE WITHOUT CARDIOVERSION N/A 06/04/2016   Procedure: TRANSESOPHAGEAL ECHOCARDIOGRAM (TEE);  Surgeon: Chrystie Nose, MD;  Location: [facility] ENDOSCOPY;  Service: Cardiovascular;  Laterality: N/A;    SOCIAL HISTORY: Social History   Socioeconomic History   Marital status: Married    Spouse name: Not on file   Number of children: 3   Years of education: 16   Highest education level: Not on file  Occupational History   Not on file  Tobacco Use    Smoking status: Never   Smokeless tobacco: Never  Vaping Use   Vaping Use: Never used  Substance and Sexual Activity   Alcohol use: No   Drug use: No   Sexual activity: Not on file  Other Topics Concern   Not on file  Social History Narrative   Right handed   No caffeine   Two story home   Retired   Surveyor, quantity   Social Determinants of Corporate investment banker Strain: Not on file  Food Insecurity: Not on file  Transportation Needs: Not on file  Physical Activity: Not on file  Stress: Not on file  Social Connections: Not on file  Intimate Partner Violence: Not on file   Justin [name] is retired. His daughter is Interior and spatial designer of nursing for a nursing home in [place], [place].    FAMILY HISTORY: Family History  Problem Relation Age of Onset   Stroke Mother    Hypertension Mother    Alzheimer's disease Father    Heart failure Father    Down syndrome Daughter     ALLERGIES:  is allergic to other and no known allergies.  MEDICATIONS:  Current Outpatient Medications  Medication Sig Dispense Refill   cyanocobalamin 1000 MCG tablet Take 1,000 mcg by mouth daily.     denosumab (PROLIA) 60 MG/ML SOSY injection See admin instructions.     denosumab (PROLIA) 60 MG/ML SOSY injection Inject 60 mg into the skin every 6 (six) months.     ergocalciferol (VITAMIN D2) 1.25 MG (50000 UT) capsule Take 1 capsule (50,000 Units total) by mouth once a week. 12 capsule 5   FREESTYLE LITE test strip      glucose monitoring kit (FREESTYLE) monitoring kit 1 each by Does not apply route as needed for other.     Lancets (FREESTYLE) lancets 1 each daily.     lenalidomide (REVLIMID) 5 MG capsule TAKE 1 CAPSULE DAILY FOR 21 DAYS ON THEN 7 DAYS OFF. REPEAT EVERY 28 DAYS 21 capsule 0   Magnesium 400 MG CAPS Take 400 mg by mouth daily.     metFORMIN (GLUCOPHAGE) 1000 MG tablet TAKE ONE-HALF (1/2) TABLET DAILY WITH BREAKFAST 90 tablet 3   nitroGLYCERIN (NITROSTAT) 0.4 MG SL tablet Place 1 tablet (0.4 mg total)  under the tongue every 5 (five) minutes as needed for chest pain (Max 3 doses in 15 mins.). 30 tablet 0   omeprazole (PRILOSEC) 20 MG capsule Take 20 mg by mouth every morning.      Potassium 99 MG TABS 1 tablet     pravastatin (PRAVACHOL) 10 MG tablet Take 1 tablet (10 mg total) by mouth daily. 90 tablet 2   pregabalin (LYRICA) 75 MG capsule Take 75 mg by mouth 3 (three) times daily.     rivaroxaban (XARELTO) 20 MG TABS tablet TAKE 1 TABLET DAILY WITH SUPPER 90 tablet 2   No current facility-administered medications for this visit.    REVIEW OF SYSTEMS:    10 Point review of Systems was  done is negative except as noted above.   PHYSICAL EXAMINATION:   ECOG PERFORMANCE STATUS: 2 - Symptomatic, <50% confined to bed Vitals:   05/14/23 0945  BP: (!) 141/71  Pulse: (!) 50  Resp: 18  Temp: (!) 97.5 F (36.4 C)  SpO2: 99%    Wt Readings from Last 3 Encounters:  02/11/23 195 lb 14.4 oz (88.9 kg)  01/13/23 193 lb 9.6 oz (87.8 kg)  01/02/23 198 lb (89.8 kg)   Body mass index is 30.35 kg/m.    GENERAL:alert, in no acute distress and comfortable SKIN: no acute rashes, no significant lesions EYES: conjunctiva are pink and non-injected, sclera anicteric OROPHARYNX: MMM, no exudates, no oropharyngeal erythema or ulceration NECK: supple, no JVD LYMPH:  no palpable lymphadenopathy in the cervical, axillary or inguinal regions LUNGS: clear to auscultation b/l with normal respiratory effort HEART: regular rate & rhythm ABDOMEN:  normoactive bowel sounds , non tender, not distended. Extremity: no pedal edema PSYCH: alert & oriented x 3 with fluent speech NEURO: no focal motor/sensory deficits   LABORATORY DATA:  I have reviewed the data as listed.     Latest Ref Rng & Units 05/07/2023    9:17 AM 02/04/2023   11:52 AM 11/13/2022    8:38 AM  CBC  WBC 4.0 - 10.5 K/uL 2.6  2.9  2.6   Hemoglobin 13.0 - 17.0 g/dL 16.1  09.6  04.5   Hematocrit 39.0 - 52.0 % 40.2  41.1  36.1    Platelets 150 - 400 K/uL 132  129  110   ANC 1.5k     Latest Ref Rng & Units 05/07/2023    9:17 AM 02/04/2023   11:52 AM 11/13/2022    8:38 AM  CMP  Glucose 70 - 99 mg/dL 409  811  914   BUN 8 - 23 mg/dL 20  21  19    Creatinine 0.61 - 1.24 mg/dL 7.82  9.56  2.13   Sodium 135 - 145 mmol/L 138  139  139   Potassium 3.5 - 5.1 mmol/L 4.5  4.3  4.1   Chloride 98 - 111 mmol/L 103  106  108   CO2 22 - 32 mmol/L 31  27  25    Calcium 8.9 - 10.3 mg/dL 9.0  9.1  8.8   Total Protein 6.5 - 8.1 g/dL 6.6  6.8  6.6   Total Bilirubin 0.3 - 1.2 mg/dL 0.7  0.7  1.0   Alkaline Phos 38 - 126 U/L 54  59  47   AST 15 - 41 U/L 19  19  20    ALT 0 - 44 U/L 8  12  12          RADIOGRAPHIC STUDIES: I have personally reviewed the radiological images as listed and agreed with the findings in the report.       Bone Marrow Biopsy 12/24/2016 (Accession [id number])  Diagnosis Bone Marrow, Aspirate,Biopsy, and Clot, left iliac crest - HYPERCELLULAR BONE MARROW FOR AGE WITH PLASMA CELL NEOPLASM. - SEE COMMENT. PERIPHERAL BLOOD: - NORMOCYTIC-NORMOCHROMIC ANEMIA. - LEUKOPENIA.  DG Bone Survey Met (Accession [id number]) (Order [id number])  Imaging  Date: 11/28/2016 Department: [facility]-RAD[facility] Released By: Kenton Kingfisher Authorizing: Johney Maine, MD  Exam Information   Status Exam Begun  Exam Ended   Final [99] 11/28/2016 11:59 AM 11/28/2016 12:36 PM  PACS Images   Show images for DG Bone Survey Met  Study Result   CLINICAL DATA:  Monoclonal gammopathy of  unknown significance. History of squamous cell carcinoma excision, basal cell carcinoma excision.   EXAM: METASTATIC BONE SURVEY   COMPARISON:  CT neck, chest, abdomen and pelvis from 10/29/16, MRI of the head from 06/01/2016, CXR 10/27/2016, lumbar spine radiographs 06/24/2016   FINDINGS: Lateral skull: Small occipital lucency which may represent a normal arachnoid granulation posteriorly in the  occiput. No definite lytic abnormality.   Cervical spine AP and lateral: Disc space narrowing at C2-3, and from C4 through C7. C4-5, C5-6 and C6-7 uncovertebral joint spurring bilaterally. No lytic abnormality.   Thoracic spine AP and lateral: T8-9 right-sided osteophytes and to a lesser degree T9-10. No lytic abnormality. Slight multilevel lumbar thoracic disc space narrowing likely degenerative.   Lumbar spine AP and lateral: Disc space narrowing at L4-5 and to a greater extent L5-S1. No lytic disease. L3 through S1 facet sclerosis.   AP pelvis: Negative for lytic disease.   Bilateral upper and lower extremities shoulders through wrist and from the hips through ankle: Negative for lytic disease. Joint space narrowing of the femorotibial compartments both knees. Subchondral cyst of the right patella.   CXR: Clear lungs. Cardiac implantable monitoring device projects over the left heart. Aortic atherosclerosis. No lytic disease.   IMPRESSION: No findings suspicious for lytic disease.     Electronically Signed   By: Tollie Eth M.D.   On: 11/28/2016 14:58     ASSESSMENT & PLAN:   82 y.o. 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 (12/2016) Peak M spike 2.6  Cytogenetics and Myeloma FISH panel.- trisomy 11 Patient has normocytic anemia without any other clear etiology. (>2g/dl lower that lower limit of normal which is 13) which per criteria would place this in the Multiple myeloma category as opposed to Smoldering Multiple myeloma (Borderline criterion)  No overt renal failure or hypercalcemia at this time No focal bone pains though he has significant chronic back pain related to degenerative disc disease. Bone survey shows no overt lytic lesions.  10/16/18 CT Coronary Morph revealed Coronary artery calcium score 299 Agatston units, this places the patient in the 47th percentile for age and  gender, suggesting intermediate risk for future cardiac events. 2. Nonobstructive coronary disease.  12/08/18 Bone Density study revealed that the pt has osteoporosis  2) Anemia and thrombocytopenia -- related to treatment (Revlimid) -stable  3) Grade 1 Neuropathy/radiculopathy-- in b/l feet and halfway up lower leg without pain  4) Borderline low B12 levels, previously 299 --- on replacement -  -continue replacement.  5) h/o recurrent CVA - thought to be related to small vessel disease as per neurology. Has a small PFO which could be an additional risk factor as well as his afib  6)  P afib Plan -Continue on Xarelto per cardiology.  7) H/o recurrent SCC -Underwent surgical excision on right forearm with Dr. Phylliss Blakes on 07/28/18. His surgical pathology showed evidence of Squamous Cell Carcinoma. Plan -continue dermatology followup  10) Lower extremity discomfort - improved, stable. Likely Discogenic pain -[place] on 12/2017 revealed no blood clot -Pt has established care with orthopedist and PT. - has had muscle cramps from his statins and this is being mx by his cardiologist.  11) Patient Active Problem List   Diagnosis Date Noted   Mild cognitive impairment 10/03/2022   Osteoporosis 03/10/2019   Multiple myeloma without remission (HCC) 03/10/2019   Chest pain 09/27/2018   Multiple myeloma (HCC) 09/27/2018  Degeneration of lumbar intervertebral disc 01/30/2018   Low back pain 01/30/2018   Lumbar radiculopathy 01/30/2018   History of loop recorder 06/12/2017   Cryptogenic stroke (HCC) 10/28/2016   Gait abnormality    History of CVA (cerebrovascular accident)    History of TIA (transient ischemic attack)    Benign essential HTN    Paroxysmal SVT (supraventricular tachycardia)    Acute blood loss anemia    Acute ischemic stroke (HCC)    CVA (cerebral vascular accident) (HCC) 10/26/2016   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   History of recent stroke 06/06/2016   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 CBC showed WBC of 2.6K, hemoglobin of 13.3, and platelets of 132K. -CMP normal, mild dehydration -Stable low normal WBC level -K/L light chains normal -myeloma panel shows M spike of 0.2g/dl -last myeloma panel 05/20/4131 showed M protein of 0.1 -Patient has no clinical evidence of myeloma progression at this time. -recommend patient to drink 40-60 oz of water daily -discussed option of PEA supplement 600 MG OTC to improve chemotherapy-related neuropathy - no prohibitive toxicities from Revlimid maintenance at this time. -Continue Revlimid 5 mg as prescribed.  -Continue with Prolia every 6 months for osteoporosis -answered all of patient's questions in detail  FOLLOW UP: Return to clinic with Dr. Candise Che in 12 weeks Labs in 11 weeks PLz schedule next 2 doses of Prolia every 6 months  The total time spent in the appointment was 30 minutes* .  All of the patient's questions were answered with apparent satisfaction. The patient knows to call the clinic with any problems, questions or concerns.   Wyvonnia Lora MD MS AAHIVMS [facility] Hematology/Oncology Physician [facility]  .*Total Encounter Time as defined by the Centers for Medicare and Medicaid Services includes, in addition to the face-to-face time of a patient visit (documented in the note above) non-face-to-face time: obtaining and reviewing outside history, ordering  and reviewing medications, tests or procedures, care coordination (communications with other health care professionals or caregivers) and documentation in the medical record.    I,Mitra Faeizi,acting as a Neurosurgeon for Wyvonnia Lora, MD.,have documented all relevant documentation on the behalf of Wyvonnia Lora, MD,as directed by  Wyvonnia Lora, MD while in the presence of Wyvonnia Lora, MD.  .I have reviewed the above documentation for accuracy and completeness, and I agree with the above. Johney Maine MD

## 2023-05-15 DIAGNOSIS — M79604 Pain in right leg: Secondary | ICD-10-CM | POA: Diagnosis not present

## 2023-05-15 DIAGNOSIS — M79605 Pain in left leg: Secondary | ICD-10-CM | POA: Diagnosis not present

## 2023-05-15 DIAGNOSIS — R269 Unspecified abnormalities of gait and mobility: Secondary | ICD-10-CM | POA: Diagnosis not present

## 2023-05-15 DIAGNOSIS — R531 Weakness: Secondary | ICD-10-CM | POA: Diagnosis not present

## 2023-05-15 LAB — MULTIPLE MYELOMA PANEL, SERUM
Albumin SerPl Elph-Mcnc: 3.5 g/dL (ref 2.9–4.4)
Albumin/Glob SerPl: 1.3 (ref 0.7–1.7)
Alpha 1: 0.2 g/dL (ref 0.0–0.4)
Alpha2 Glob SerPl Elph-Mcnc: 0.7 g/dL (ref 0.4–1.0)
B-Globulin SerPl Elph-Mcnc: 1 g/dL (ref 0.7–1.3)
Gamma Glob SerPl Elph-Mcnc: 1 g/dL (ref 0.4–1.8)
Globulin, Total: 2.9 g/dL (ref 2.2–3.9)
IgA: 118 mg/dL (ref 61–437)
IgG (Immunoglobin G), Serum: 997 mg/dL (ref 603–1613)
IgM (Immunoglobulin M), Srm: 27 mg/dL (ref 15–143)
M Protein SerPl Elph-Mcnc: 0.2 g/dL — ABNORMAL HIGH
Total Protein ELP: 6.4 g/dL (ref 6.0–8.5)

## 2023-05-20 ENCOUNTER — Encounter: Payer: Self-pay | Admitting: Hematology

## 2023-05-20 ENCOUNTER — Other Ambulatory Visit: Payer: Self-pay

## 2023-05-20 DIAGNOSIS — D490 Neoplasm of unspecified behavior of digestive system: Secondary | ICD-10-CM | POA: Diagnosis not present

## 2023-05-20 DIAGNOSIS — C9001 Multiple myeloma in remission: Secondary | ICD-10-CM

## 2023-05-20 MED ORDER — LENALIDOMIDE 5 MG PO CAPS
ORAL_CAPSULE | ORAL | 0 refills | Status: DC
Start: 2023-05-20 — End: 2023-06-24

## 2023-05-22 DIAGNOSIS — M79605 Pain in left leg: Secondary | ICD-10-CM | POA: Diagnosis not present

## 2023-05-22 DIAGNOSIS — R531 Weakness: Secondary | ICD-10-CM | POA: Diagnosis not present

## 2023-05-22 DIAGNOSIS — R269 Unspecified abnormalities of gait and mobility: Secondary | ICD-10-CM | POA: Diagnosis not present

## 2023-05-22 DIAGNOSIS — M79604 Pain in right leg: Secondary | ICD-10-CM | POA: Diagnosis not present

## 2023-05-27 DIAGNOSIS — R269 Unspecified abnormalities of gait and mobility: Secondary | ICD-10-CM | POA: Diagnosis not present

## 2023-05-27 DIAGNOSIS — M79605 Pain in left leg: Secondary | ICD-10-CM | POA: Diagnosis not present

## 2023-05-27 DIAGNOSIS — M79604 Pain in right leg: Secondary | ICD-10-CM | POA: Diagnosis not present

## 2023-05-27 DIAGNOSIS — R531 Weakness: Secondary | ICD-10-CM | POA: Diagnosis not present

## 2023-06-03 DIAGNOSIS — R531 Weakness: Secondary | ICD-10-CM | POA: Diagnosis not present

## 2023-06-03 DIAGNOSIS — M79604 Pain in right leg: Secondary | ICD-10-CM | POA: Diagnosis not present

## 2023-06-03 DIAGNOSIS — R269 Unspecified abnormalities of gait and mobility: Secondary | ICD-10-CM | POA: Diagnosis not present

## 2023-06-03 DIAGNOSIS — M79605 Pain in left leg: Secondary | ICD-10-CM | POA: Diagnosis not present

## 2023-06-17 DIAGNOSIS — M79605 Pain in left leg: Secondary | ICD-10-CM | POA: Diagnosis not present

## 2023-06-17 DIAGNOSIS — R269 Unspecified abnormalities of gait and mobility: Secondary | ICD-10-CM | POA: Diagnosis not present

## 2023-06-17 DIAGNOSIS — R531 Weakness: Secondary | ICD-10-CM | POA: Diagnosis not present

## 2023-06-17 DIAGNOSIS — M79604 Pain in right leg: Secondary | ICD-10-CM | POA: Diagnosis not present

## 2023-06-24 ENCOUNTER — Other Ambulatory Visit: Payer: Self-pay | Admitting: Hematology

## 2023-06-24 DIAGNOSIS — C9001 Multiple myeloma in remission: Secondary | ICD-10-CM

## 2023-07-01 ENCOUNTER — Other Ambulatory Visit: Payer: Self-pay | Admitting: Hematology

## 2023-07-01 DIAGNOSIS — R531 Weakness: Secondary | ICD-10-CM | POA: Diagnosis not present

## 2023-07-01 DIAGNOSIS — R269 Unspecified abnormalities of gait and mobility: Secondary | ICD-10-CM | POA: Diagnosis not present

## 2023-07-01 DIAGNOSIS — M79605 Pain in left leg: Secondary | ICD-10-CM | POA: Diagnosis not present

## 2023-07-01 DIAGNOSIS — M79604 Pain in right leg: Secondary | ICD-10-CM | POA: Diagnosis not present

## 2023-07-15 ENCOUNTER — Encounter: Payer: Self-pay | Admitting: Psychology

## 2023-07-15 DIAGNOSIS — Z8589 Personal history of malignant neoplasm of other organs and systems: Secondary | ICD-10-CM | POA: Insufficient documentation

## 2023-07-16 ENCOUNTER — Encounter: Payer: Self-pay | Admitting: Psychology

## 2023-07-16 ENCOUNTER — Ambulatory Visit: Payer: Medicare Other

## 2023-07-16 ENCOUNTER — Ambulatory Visit (INDEPENDENT_AMBULATORY_CARE_PROVIDER_SITE_OTHER): Payer: Medicare Other | Admitting: Psychology

## 2023-07-16 DIAGNOSIS — G3184 Mild cognitive impairment, so stated: Secondary | ICD-10-CM | POA: Diagnosis not present

## 2023-07-16 DIAGNOSIS — R4189 Other symptoms and signs involving cognitive functions and awareness: Secondary | ICD-10-CM

## 2023-07-16 DIAGNOSIS — I6381 Other cerebral infarction due to occlusion or stenosis of small artery: Secondary | ICD-10-CM

## 2023-07-16 DIAGNOSIS — I679 Cerebrovascular disease, unspecified: Secondary | ICD-10-CM

## 2023-07-16 HISTORY — DX: Mild cognitive impairment of uncertain or unknown etiology: G31.84

## 2023-07-16 NOTE — Progress Notes (Signed)
NEUROPSYCHOLOGICAL EVALUATION [place] Department of Neurology  Date of Evaluation: July 16, 2023  Reason for Referral:   Chrishawn Boley is a 82 y.o. right-handed Caucasian male referred by Marlowe Kays, PA-C, to characterize his current cognitive functioning and assist with diagnostic clarity and treatment planning in the context of subjective cognitive decline.   Assessment and Plan:   Clinical Impression(s): Mr. Enyeart pattern of performance is suggestive of an isolated impairment learning and later recalling a list of words. Relative weaknesses were also exhibited across semantic fluency, confrontation naming, and the learning and later recollection of story-based content; however, these scores were no lower than the below average normative range and certainly not consistent with an objective impairment at the present time. Performances were appropriate relative to age-matched peers across processing speed, attention/concentration, executive functioning, receptive language, phonemic fluency, visuospatial abilities, and recognition/consolidation aspects of memory. Mr. Kozlov denied difficulties completing instrumental activities of daily living (ADLs) independently. As such, given evidence for cognitive dysfunction described above, he meets criteria for a Mild Neurocognitive Disorder ("mild cognitive impairment"). However, the mild nature of this classification should be emphasized at the present time.  The etiology for mild cognitive dysfunction is unclear. Mr. Bennett explicitly expressed concern surrounding Alzheimer's disease during his interview. While memory and expressive language weakness can raise potential concerns for the presence of this illness, current scores were normatively appropriate with the exception of a list learning task. It would appear premature to suggest the presence of a neurodegenerative illness such as Alzheimer's disease at the present  time. Continued medical monitoring will be important moving forward however.   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 While expected deficits would typically surround processing speed, attention/concentration, and executive functioning with these findings, this being the primary cause for memory and information retrieval weaknesses remains plausible. Mild to moderate anxiety and depression could further exacerbate deficits. He does not exhibit cognitive or behavioral concerns for Lewy body disease, another more rare parkinsonian presentation, or frontotemporal lobar degeneration.   Recommendations: A repeat neuropsychological evaluation in 12-24 months is recommended to assess the trajectory of future cognitive decline should it occur. This will also aid in future efforts towards improved diagnostic clarity.  Mr. Tweed is encouraged to speak with his prescribing physician regarding medication adjustments to optimally manage ongoing psychiatric distress.  Should there be progression of current deficits over time, Mr. Fauth is unlikely to regain any independent living skills lost. Therefore, it is recommended that he remain as involved as possible in all aspects of household chores, finances, and medication management, with supervision to ensure adequate performance. He will likely benefit from the establishment and maintenance of a routine in order to maximize his functional abilities over time.  Mr. Sedano is encouraged to attend to lifestyle factors for brain health (e.g., regular physical exercise, good nutrition habits and consideration of the MIND-DASH diet, regular participation in cognitively-stimulating activities, and general stress management techniques), which are likely to have benefits for both emotional adjustment and cognition. Optimal  control of vascular risk factors (including safe cardiovascular exercise and adherence to dietary recommendations) is encouraged. Continued participation in activities which provide mental stimulation and social interaction is also recommended.   Memory can be improved using internal strategies such as rehearsal, repetition, chunking, mnemonics, association, and imagery. External strategies such as written notes in a consistently used memory  journal, visual and nonverbal auditory cues such as a calendar on the refrigerator or appointments with alarm, such as on a cell phone, can also help maximize recall.    When learning new information, he would benefit from information being broken up into small, manageable pieces. He may also find it helpful to articulate the material in his own words and in a context to promote encoding at the onset of a new task. This material may need to be repeated multiple times to promote encoding.  Because he shows better recall for structured information, he will likely understand and retain new information better if it is presented to him in a meaningful or well-organized manner at the outset, such as grouping items into meaningful categories or presenting information in an outlined, bulleted, or story format.  Reducing anxiety may also aid in the retrieval of information. He is encouraged to prepare scripts he can use socially when he experiences difficulty with word finding or memory. Such scripts should be brief explanations of the difficulty (e.g., "the word escapes me now") and allow him to move the conversation forward quickly rather than dwelling on the issue.  Review of Records:   Mr. Dufault was seen by [facility] Marlowe Kays, PA-C) on 10/02/2022 for an evaluation of memory loss. For the prior several months, Mr. Bethel described increased difficulties with name recollection, including those of close family members. He also described trouble losing his train  of thought and difficulties remembering upcoming appointments. There is a history of prior lacunar infarctions per neuroimaging. ADLs were described as intact. Performance on a brief cognitive screening instrument (MOCA) was 26/30. Ultimately, Mr. Olivera was referred for a comprehensive neuropsychological evaluation to characterize his cognitive abilities and to assist with diagnostic clarity and treatment planning.   Head CT on 06/01/2016 in the context of acute stroke concerns was negative for any acute findings. 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 It also suggested a 2.1 cm x 2.8 cm right parotid mass. Head CT on 10/25/2016 in the context of acute stroke concerns was negative for any acute findings. Brain MRI on 10/26/2016 revealed patchy acute small volume ischemic infarcts involving the right centrum semiovale and right parietal cortex. No more recent neuroimaging was available for review.   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 Ladona Ridgel   BASAL CELL CARCINOMA EXCISION     off of back   ELECTROPHYSIOLOGIC STUDY N/A 05/01/2015   Procedure: SVT Ablation;  Surgeon: Marinus Maw, MD;  Location: [facility] INVASIVE CV LAB;  Service: Cardiovascular;  Laterality: N/A;   EP IMPLANTABLE DEVICE N/A 06/04/2016   Procedure: Loop Recorder Insertion;  Surgeon: Hillis Range, MD;  Location: [facility] INVASIVE CV LAB;  Service: Cardiovascular;  Laterality: N/A;   implantable loop recorder removal     ILR removed by Dr Johney Frame   INGUINAL HERNIA REPAIR Bilateral 06/16/2017   Procedure: LAPAROSCOPIC BILATERAL INGUINAL HERNIA REPAIR;  Surgeon: Berna Bue, MD;  Location: [facility] OR;  Service: General;  Laterality: Bilateral;   INSERTION OF MESH Bilateral 06/16/2017   Procedure: INSERTION OF MESH;  Surgeon: Berna Bue, MD;  Location: [facility] OR;  Service: General;  Laterality: Bilateral;   MASS EXCISION Right 07/28/2018   Procedure: EXCISION OF RIGHT FOREARM MASS;  Surgeon: Berna Bue, MD;  Location: [facility] ORS;  Service: General;  Laterality: Right;   SQUAMOUS CELL CARCINOMA EXCISION     TEE WITHOUT CARDIOVERSION N/A 06/04/2016   Procedure: TRANSESOPHAGEAL ECHOCARDIOGRAM (TEE);  Surgeon: Chrystie Nose, MD;  Location: [facility] ENDOSCOPY;  Service: Cardiovascular;  Laterality: N/A;    Current Outpatient Medications:    cyanocobalamin 1000 MCG tablet, Take 1,000 mcg by mouth daily., Disp: , Rfl:    denosumab (PROLIA) 60 MG/ML SOSY injection, See admin instructions., Disp: , Rfl:    denosumab (PROLIA) 60 MG/ML SOSY injection, Inject 60 mg into the skin every 6 (six) months.,  Disp: , Rfl:    FREESTYLE LITE test strip, , Disp: , Rfl:    glucose monitoring kit (FREESTYLE) monitoring kit, 1 each by Does not apply route as needed for other., Disp: , Rfl:    Lancets (FREESTYLE) lancets, 1 each daily., Disp: , Rfl:    lenalidomide (REVLIMID) 5 MG capsule, TAKE 1 CAPSULE DAILY FOR 21 DAYS ON THEN 7 DAYS OFF. REPEAT EVERY 28 DAYS, Disp: 21 capsule, Rfl: 0   Magnesium 400 MG CAPS, Take 400 mg by mouth daily., Disp: , Rfl:    metFORMIN (GLUCOPHAGE) 1000 MG tablet, TAKE ONE-HALF (1/2) TABLET DAILY WITH BREAKFAST, Disp: 90 tablet, Rfl: 3   nitroGLYCERIN (NITROSTAT) 0.4 MG SL tablet, Place 1 tablet (0.4 mg total) under the tongue every 5 (five) minutes as needed for chest pain (Max 3 doses in 15 mins.)., Disp: 30 tablet, Rfl: 0   omeprazole (PRILOSEC) 20 MG capsule, Take 20 mg by mouth every morning. , Disp: , Rfl:    Potassium 99 MG TABS, 1 tablet, Disp: , Rfl:    pravastatin (PRAVACHOL) 10 MG tablet, Take 1 tablet (10 mg total) by mouth daily., Disp: 90 tablet, Rfl: 2   pregabalin (LYRICA) 75 MG capsule, Take 75 mg by mouth 3 (three) times daily., Disp: , Rfl:    rivaroxaban (XARELTO) 20 MG TABS tablet, TAKE 1 TABLET DAILY WITH SUPPER, Disp: 90 tablet, Rfl: 2   Vitamin D, Ergocalciferol, (DRISDOL) 1.25 MG (50000 UNIT) CAPS capsule, TAKE 1 CAPSULE BY MOUTH 1 TIME A WEEK, Disp: 12 capsule, Rfl: 5  Clinical Interview:   The following information was obtained during a clinical interview with Mr. Nosal prior to cognitive testing.  Cognitive Symptoms: Decreased short-term memory:  Endorsed. Difficulties largely surrounded trouble with name recollection and word finding more broadly. He also noted some details recalling fine details of recent conversations but is generally able to recall the bigger picture items. Difficulties were said to be present for the past six or so months and do seem to have worsened over time.  Decreased long-term memory: Denied. Decreased  attention/concentration: Endorsed. He reported a "little bit" of difficulty with sustained focus and distractibility. He also described instances where he will lose his train of thought while speaking.  Reduced processing speed: Endorsed "a little bit." Difficulties with executive functions: Endorsed. He reported trouble with organization and multi-tasking. He denied trouble with impulsivity as well as any significant personality changes.  Difficulties with emotion regulation: Denied. Difficulties with receptive language: Denied. Difficulties with word finding: Endorsed. Decreased visuoperceptual ability: Denied.  Difficulties completing ADLs: Denied.  Additional Medical History: History of traumatic brain injury/concussion: Denied. History of stroke: Endorsed (see scans above).  History of seizure activity: Denied. History of known exposure to toxins: Denied. Symptoms of chronic pain: Denied. Experience of frequent headaches/migraines: Denied. He did describe occasional sharp pains behind his right eye. These dissipate quickly. No known triggers were identified.  Frequent instances of dizziness/vertigo: Denied.  Sensory changes: He wears reading glasses with benefit and has had cataract surgery in the past. Other sensory changes/difficulties (e.g., hearing, taste, smell) were denied.  Balance/coordination difficulties: He reported mild instability at times. This is likely impacted by peripheral neuropathy and complications stemming from diabetes. He reported a few falls in the past, largely caused by his yard being on a decent slope and him losing his balance while working outside. He denied any sustained injuries from said falls.  Other motor difficulties: Denied.  Sleep History: Estimated hours obtained each night: 6 hours.  Difficulties falling asleep: Denied. Difficulties staying asleep: Endorsed. He reported commonly waking earlier than desired for unknown reasons. He is not always  able to fall back asleep.  Feels rested and refreshed upon awakening: Variably so depending in the quantity and quality of sleep he is able to obtain the night before.   History of snoring: Unknown.  History of waking up gasping for air: Denied. Witnessed breath cessation while asleep: Denied.  History of vivid dreaming: Denied. Excessive movement while asleep: Denied. Instances of acting out his dreams: Denied.  Psychiatric/Behavioral Health History: Depression: He described his current mood as "pretty good" and denied to his knowledge any prior mental health concerns or formal diagnoses. He did describe some frustration surrounding word finding difficulties, while also stating that he is more short-tempered lately. Current or remote suicidal ideation, intent, or plan was denied.  Anxiety: Denied. Mania: Denied. Trauma History: Denied. Visual/auditory hallucinations: Denied. Delusional thoughts: Denied.  Tobacco: Denied. Alcohol: He denied current alcohol consumption as well as a history of problematic alcohol abuse or dependence.  Recreational drugs: Denied.  Family History: Problem Relation Age of Onset   Stroke Mother    Hypertension Mother    Language disorder Mother        Aphasia; unknown cause   Alzheimer's disease Father    Heart failure Father    Dementia Sister        Lewy body   Down syndrome Daughter    This information was confirmed by Mr. Valko.  Academic/Vocational History: Highest level of educational attainment: 17 years. 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 He completed half of an [employer] program following this, stopping due to  financial reasons. He described himself as a good (A/B) student throughout academic settings. No relative weaknesses were identified.  History of developmental delay: Denied. History of grade repetition: Denied. Enrollment in special education courses: Denied. History of LD/ADHD:  Denied.  Employment: Retired.  Evaluation Results:   Behavioral Observations: Mr. Apodaca was unaccompanied, arrived to his appointment on time, and was appropriately dressed and groomed. He appeared alert and oriented. Observed gait and station were within normal limits. Gross motor functioning appeared intact upon informal observation and no abnormal movements (e.g., tremors) were noted. His affect was generally relaxed and positive. Spontaneous speech was fluent. Mild word finding difficulties were observed. He was also noted to lose his train of thought several times throughout the current interview. Thought processes were otherwise coherent, organized, and normal in content. Insight into his cognitive difficulties appeared adequate.   During testing, word finding difficulties continued. Sustained attention was appropriate. Task engagement was adequate and he persisted when challenged. Overall, Mr. Winker was cooperative with the clinical interview and subsequent testing procedures.   Adequacy of Effort: The validity of neuropsychological testing is limited by the extent to which the individual being tested may be assumed to have exerted adequate effort during testing. Mr. Allbritton expressed his intention to perform to the best of his abilities and exhibited adequate task engagement and persistence. Scores across stand-alone and embedded performance validity measures were within expectation. As such, the results of the current evaluation are believed to be a valid representation of Mr. Guillet's current cognitive functioning.  Test Results: Mr. Quant was fully oriented at the time of the current evaluation.  Intellectual abilities based upon educational and vocational attainment were estimated to be in the average range. Premorbid abilities were estimated to be within the average range based upon a single-word reading test.   Processing speed was average. Basic attention was average. More complex  attention (e.g., working memory) was average to above average. Executive functioning was average.  Assessed receptive language abilities were above average. Likewise, Mr. Banka did not exhibit any difficulties comprehending task instructions and answered all questions asked of him appropriately. Assessed expressive language was mildly variable but overall adequate. Phonemic fluency was above average, semantic fluency was below average, and confrontation naming was below average to average.    Assessed visuospatial/visuoconstructional abilities were average to well above average.    Learning (i.e., encoding) of novel verbal information was exceptionally low to below average. Spontaneous delayed recall (i.e., retrieval) of previously learned information was exceptionally low across a list learning task but below average to average across story and figure-based tasks. Retention rates were 83% across a story learning task, 0% across a list learning task, and 61% across a figure drawing task. Performance across recognition tasks was below average to average, suggesting evidence for information consolidation.  Fine motor coordination and speed was average bilaterally.   Results of emotional screening instruments suggested that recent symptoms of generalized anxiety were in the mild to moderate range, while symptoms of depression were within the mild range. A screening instrument assessing recent sleep quality suggested the presence of minimal sleep dysfunction.  Tables of Scores:   Note: This summary of test scores accompanies the interpretive report and should not be considered in isolation without reference to the appropriate sections in the text. Descriptors are based on appropriate normative data and may be adjusted based on clinical judgment. Terms such as "Within Normal Limits" and "Outside Normal Limits" are used when a more specific description of the  test score cannot be determined.        Percentile - Normative Descriptor > 98 - Exceptionally High 91-97 - Well Above Average 75-90 - Above Average 25-74 - Average 9-24 - Below Average 2-8 - Well Below Average < 2 - Exceptionally Low       Validity:   DESCRIPTOR       DCT: --- --- Within Normal Limits  RBANS EI: --- --- Within Normal Limits  WAIS-IV RDS: --- --- Within Normal Limits       Orientation:      Raw Score Percentile   NAB Orientation, Form 1 29/29 --- ---       Cognitive Screening:      Raw Score Percentile   SLUMS: 21/30 --- ---       RBANS, Form A: Standard Score/ Scaled Score Percentile   Total Score 86 18 Below Average  Immediate Memory 69 2 Well Below Average    List Learning 3 1 Exceptionally Low    Story Memory 6 9 Below Average  Visuospatial/Constructional 116 86 Above Average    Figure Copy 11 63 Average    Line Orientation 19/20 >75 Above Average  Language 92 30 Average    Picture Naming 9/10 26-50 Average    Semantic Fluency 6 9 Below Average  Attention 97 42 Average    Digit Span 9 37 Average    Coding 10 50 Average  Delayed Memory 78 7 Well Below Average    List Recall 0/10 <2 Exceptionally Low    List Recognition 17/20 10-16 Below Average    Story Recall 7 16 Below Average    Story Recognition 8/12 14-28 Below Average to Average    Figure Recall 9 37 Average    Figure Recognition 4/8 21-38 Below Average to Average        Intellectual Functioning:      Standard Score Percentile   Test of Premorbid Functioning: 109 73 Average       Attention/Executive Function:     Trail Making Test (TMT): Raw Score (T Score) Percentile     Part A 34 secs.,  0 errors (51) 54 Average    Part B 94 secs.,  0 errors (52) 58 Average         Scaled Score Percentile   WAIS-IV Digit Span: 11 63 Average    Forward 9 37 Average    Backward 13 84 Above Average    Sequencing 10 50 Average        Scaled Score Percentile   WAIS-IV Similarities: 11 63 Average       D-KEFS Color-Word Interference  Test: Raw Score (Scaled Score) Percentile     Color Naming 40 secs. (8) 25 Average    Word Reading 24 secs. (11) 63 Average    Inhibition 92 secs. (10) 50 Average      Total Errors 5 errors (9) 37 Average    Inhibition/Switching 98 secs. (10) 50 Average      Total Errors 1 error (13) 84 Above Average       D-KEFS Design Fluency Test: Raw Score (Scaled Score) Percentile     Condition 1 - Filled Dots 6 (9) 37 Average    Condition 2 - Empty Dots 8 (11) 63 Average    Condition 3 - Switching 4 (9) 37 Average      Total Set Loss Errors 4 (9) 37 Average      Total Repetition Errors 1 (12) 75 Above Average  Language:     Verbal Fluency Test: Raw Score (Scaled Score) Percentile     Phonemic Fluency (CFL) 41 (12) 75 Above Average    Category Fluency 25 (6) 9 Below Average  *Based on [employer] Older Normative Studies (MOANS)          NAB Language Module, Form 1: T Score Percentile     Auditory Comprehension 57 75 Above Average    Naming 27/31 (38) 12 Below Average       Visuospatial/Visuoconstruction:      Raw Score Percentile   Clock Drawing: 10/10 --- Within Normal Limits        Scaled Score Percentile   WAIS-IV Block Design: 15 95 Well Above Average       Sensory-Motor:     [place] Grooved Pegboard Test: Raw Score Percentile     Dominant Hand 98 secs.,  4 drops  50 Average    Non-Dominant Hand 96 secs.,  2 drops  62 Average       Mood and Personality:      Raw Score Percentile   Geriatric Depression Scale: 11 --- Mild  Geriatric Anxiety Scale: 21 --- Mild    Somatic 6 --- Mild    Cognitive 7 --- Moderate    Affective 8 --- Moderate       Additional Questionnaires:      Raw Score Percentile   PROMIS Sleep Disturbance Questionnaire: 21 --- None to Slight   Informed Consent and Coding/Compliance:   The current evaluation represents a clinical evaluation for the purposes previously outlined by the referral source and is in no way reflective of a forensic evaluation.    Mr. Saetern was provided with a verbal description of the nature and purpose of the present neuropsychological evaluation. Also reviewed were the foreseeable risks and/or discomforts and benefits of the procedure, limits of confidentiality, and mandatory reporting requirements of this provider. The patient was given the opportunity to ask questions and receive answers about the evaluation. Oral consent to participate was provided by the patient.   This evaluation was conducted by Newman Nickels, Ph.D., ABPP-CN, board certified clinical neuropsychologist. Mr. Ogren completed a clinical interview with Dr. Milbert Coulter, billed as one unit [phone], and 145 minutes of cognitive testing and scoring, billed as one unit [phone] and four additional units [id number]. Psychometrist Shan Levans, B.S. assisted Dr. Milbert Coulter with test administration and scoring procedures. As a separate and discrete service, one unit [id number] and two units [id number] were billed for Dr. Tammy Sours time spent in interpretation and report writing.

## 2023-07-16 NOTE — Progress Notes (Signed)
   Psychometrician Note   Cognitive testing was administered to Justin [name] by Shan Levans, B.S. (psychometrist) under the supervision of Dr. Newman Nickels, Ph.D., licensed psychologist on 07/16/2023. Justin [name] did not appear overtly distressed by the testing session per behavioral observation or responses across self-report questionnaires. Rest breaks were offered.    The battery of tests administered was selected by Dr. Newman Nickels, Ph.D. with consideration to Justin [name] current level of functioning, the nature of his symptoms, emotional and behavioral responses during interview, level of literacy, observed level of motivation/effort, and the nature of the referral question. This battery was communicated to the psychometrist. Communication between Dr. Newman Nickels, Ph.D. and the psychometrist was ongoing throughout the evaluation and Dr. Newman Nickels, Ph.D. was immediately accessible at all times. Dr. Newman Nickels, Ph.D. provided supervision to the psychometrist on the date of this service to the extent necessary to assure the quality of all services provided.    Justin [name] will return within approximately 1-2 weeks for an interactive feedback session with Dr. Milbert Coulter at which time his test performances, clinical impressions, and treatment recommendations will be reviewed in detail. Justin [name] understands he can contact our office should he require our assistance before this time.  A total of 145 minutes of billable time were spent face-to-face with Justin [name] by the psychometrist. This includes both test administration and scoring time. Billing for these services is reflected in the clinical report generated by Dr. Newman Nickels, Ph.D.  This note reflects time spent with the psychometrician and does not include test scores or any clinical interpretations made by Dr. Milbert Coulter. The full report will follow in a separate note.

## 2023-07-17 DIAGNOSIS — M79604 Pain in right leg: Secondary | ICD-10-CM | POA: Diagnosis not present

## 2023-07-17 DIAGNOSIS — R269 Unspecified abnormalities of gait and mobility: Secondary | ICD-10-CM | POA: Diagnosis not present

## 2023-07-17 DIAGNOSIS — R531 Weakness: Secondary | ICD-10-CM | POA: Diagnosis not present

## 2023-07-17 DIAGNOSIS — E7800 Pure hypercholesterolemia, unspecified: Secondary | ICD-10-CM | POA: Diagnosis not present

## 2023-07-17 DIAGNOSIS — I25119 Atherosclerotic heart disease of native coronary artery with unspecified angina pectoris: Secondary | ICD-10-CM | POA: Diagnosis not present

## 2023-07-17 DIAGNOSIS — M79605 Pain in left leg: Secondary | ICD-10-CM | POA: Diagnosis not present

## 2023-07-17 DIAGNOSIS — R413 Other amnesia: Secondary | ICD-10-CM | POA: Diagnosis not present

## 2023-07-17 DIAGNOSIS — I10 Essential (primary) hypertension: Secondary | ICD-10-CM | POA: Diagnosis not present

## 2023-07-17 DIAGNOSIS — E1169 Type 2 diabetes mellitus with other specified complication: Secondary | ICD-10-CM | POA: Diagnosis not present

## 2023-07-17 DIAGNOSIS — I679 Cerebrovascular disease, unspecified: Secondary | ICD-10-CM | POA: Diagnosis not present

## 2023-07-21 ENCOUNTER — Other Ambulatory Visit: Payer: Self-pay

## 2023-07-21 DIAGNOSIS — C9001 Multiple myeloma in remission: Secondary | ICD-10-CM

## 2023-07-21 MED ORDER — LENALIDOMIDE 5 MG PO CAPS
ORAL_CAPSULE | ORAL | 0 refills | Status: DC
Start: 2023-07-21 — End: 2023-08-22

## 2023-07-24 ENCOUNTER — Encounter: Payer: Medicare Other | Admitting: Psychology

## 2023-07-29 ENCOUNTER — Ambulatory Visit (INDEPENDENT_AMBULATORY_CARE_PROVIDER_SITE_OTHER): Payer: Medicare Other | Admitting: Physician Assistant

## 2023-07-29 ENCOUNTER — Encounter: Payer: Self-pay | Admitting: Physician Assistant

## 2023-07-29 VITALS — BP 121/77 | HR 86 | Resp 20 | Ht 68.0 in | Wt 199.0 lb

## 2023-07-29 DIAGNOSIS — G3184 Mild cognitive impairment, so stated: Secondary | ICD-10-CM

## 2023-07-29 MED ORDER — MEMANTINE HCL 5 MG PO TABS: 5.0000 mg | ORAL_TABLET | Freq: Every evening | ORAL | 11 refills | Status: DC

## 2023-07-29 NOTE — Patient Instructions (Signed)
It was a pleasure to see you today at our office.   Recommendations:  Folllow up in 6 months     Keep taking B12 supplements  Start Memantine 5mg  tablets.  Take 1 tablet at bedtime   Whom to call:  Memory  decline, memory medications: Call our office [phone]   For psychiatric meds, mood meds: Please have your primary care physician manage these medications.      For assessment of decision of mental capacity and competency:  Call Dr. Erick Blinks, geriatric psychiatrist at [phone]  For guidance in geriatric dementia issues please call [facility] [phone]    If you have any severe symptoms of a stroke, or other severe issues such as confusion,severe chills or fever, etc call 911 or go to the ER as you may need to be evaluated further      RECOMMENDATIONS FOR ALL PATIENTS WITH MEMORY PROBLEMS: 1. Continue to exercise (Recommend 30 minutes of walking everyday, or 3 hours every week) 2. Increase social interactions - continue going to [place] and enjoy social gatherings with friends and family 3. Eat healthy, avoid fried foods and eat more fruits and vegetables 4. Maintain adequate blood pressure, blood sugar, and blood cholesterol level. Reducing the risk of stroke and cardiovascular disease also helps promoting better memory. 5. Avoid stressful situations. Live a simple life and avoid aggravations. Organize your time and prepare for the next day in anticipation. 6. Sleep well, avoid any interruptions of sleep and avoid any distractions in the bedroom that may interfere with adequate sleep quality 7. Avoid sugar, avoid sweets as there is a strong link between excessive sugar intake, diabetes, and cognitive impairment We discussed the Mediterranean diet, which has been shown to help patients reduce the risk of progressive memory disorders and reduces cardiovascular risk. This includes eating fish, eat fruits and green leafy vegetables, nuts like almonds and  hazelnuts, walnuts, and also use olive oil. Avoid fast foods and fried foods as much as possible. Avoid sweets and sugar as sugar use has been linked to worsening of memory function.  There is always a concern of gradual progression of memory problems. If this is the case, then we may need to adjust level of care according to patient needs. Support, both to the patient and caregiver, should then be put into place.      You have been referred for a neuropsychological evaluation (i.e., evaluation of memory and thinking abilities). Please bring someone with you to this appointment if possible, as it is helpful for the doctor to hear from both you and another adult who knows you well. Please bring eyeglasses and hearing aids if you wear them.    The evaluation will take approximately 3 hours and has two parts:   The first part is a clinical interview with the neuropsychologist (Dr. Milbert Coulter or Dr. Roseanne Reno). During the interview, the neuropsychologist will speak with you and the individual you brought to the appointment.    The second part of the evaluation is testing with the doctor's technician Annabelle Harman or Selena Batten). During the testing, the technician will ask you to remember different types of material, solve problems, and answer some questionnaires. Your family member will not be present for this portion of the evaluation.   Please note: We must reserve several hours of the neuropsychologist's time and the psychometrician's time for your evaluation appointment. As such, there is a No-Show fee of $100. If you are unable to attend any of your appointments, please  contact our office as soon as possible to reschedule.    FALL PRECAUTIONS: Be cautious when walking. Scan the area for obstacles that may increase the risk of trips and falls. When getting up in the mornings, sit up at the edge of the bed for a few minutes before getting out of bed. Consider elevating the bed at the head end to avoid drop of blood  pressure when getting up. Walk always in a well-lit room (use night lights in the walls). Avoid area rugs or power cords from appliances in the middle of the walkways. Use a walker or a cane if necessary and consider physical therapy for balance exercise. Get your eyesight checked regularly.  FINANCIAL OVERSIGHT: Supervision, especially oversight when making financial decisions or transactions is also recommended.  HOME SAFETY: Consider the safety of the kitchen when operating appliances like stoves, microwave oven, and blender. Consider having supervision and share cooking responsibilities until no longer able to participate in those. Accidents with firearms and other hazards in the house should be identified and addressed as well.   ABILITY TO BE LEFT ALONE: If patient is unable to contact 911 operator, consider using [facility], or when the need is there, arrange for someone to stay with patients. Smoking is a fire hazard, consider supervision or cessation. Risk of wandering should be assessed by caregiver and if detected at any point, supervision and safe proof recommendations should be instituted.  MEDICATION SUPERVISION: Inability to self-administer medication needs to be constantly addressed. Implement a mechanism to ensure safe administration of the medications.   DRIVING: Regarding driving, in patients with progressive memory problems, driving will be impaired. We advise to have someone else do the driving if trouble finding directions or if minor accidents are reported. Independent driving assessment is available to determine safety of driving.   If you are interested in the driving assessment, you can contact the following:  [facility] [employer] [facility] in [place] [phone]  [facility] [phone]  [facility] [phone] or [phone]    Mediterranean Diet A Mediterranean diet refers to food and lifestyle choices  that are based on the traditions of countries located on the [employer] [place]. This way of eating has been shown to help prevent certain conditions and improve outcomes for people who have chronic diseases, like kidney disease and heart disease. What are tips for following this plan? Lifestyle  Cook and eat meals together with your family, when possible. Drink enough fluid to keep your urine clear or pale yellow. Be physically active every day. This includes: Aerobic exercise like running or swimming. Leisure activities like gardening, walking, or housework. Get 7-8 hours of sleep each night. If recommended by your health care provider, drink red wine in moderation. This means 1 glass a day for nonpregnant women and 2 glasses a day for men. A glass of wine equals 5 oz (150 mL). Reading food labels  Check the serving size of packaged foods. For foods such as rice and pasta, the serving size refers to the amount of cooked product, not dry. Check the total fat in packaged foods. Avoid foods that have saturated fat or trans fats. Check the ingredients list for added sugars, such as corn syrup. Shopping  At the grocery store, buy most of your food from the areas near the walls of the store. This includes: Fresh fruits and vegetables (produce). Grains, beans, nuts, and seeds. Some of these may be available in unpackaged forms or large amounts (  in bulk). Fresh seafood. Poultry and eggs. Low-fat dairy products. Buy whole ingredients instead of prepackaged foods. Buy fresh fruits and vegetables in-season from local farmers markets. Buy frozen fruits and vegetables in resealable bags. If you do not have access to quality fresh seafood, buy precooked frozen shrimp or canned fish, such as tuna, salmon, or sardines. Buy small amounts of raw or cooked vegetables, salads, or olives from the deli or salad bar at your store. Stock your pantry so you always have certain foods on hand, such as olive oil,  canned tuna, canned tomatoes, rice, pasta, and beans. Cooking  Cook foods with extra-virgin olive oil instead of using butter or other vegetable oils. Have meat as a side dish, and have vegetables or grains as your main dish. This means having meat in small portions or adding small amounts of meat to foods like pasta or stew. Use beans or vegetables instead of meat in common dishes like chili or lasagna. Experiment with different cooking methods. Try roasting or broiling vegetables instead of steaming or sauteing them. Add frozen vegetables to soups, stews, pasta, or rice. Add nuts or seeds for added healthy fat at each meal. You can add these to yogurt, salads, or vegetable dishes. Marinate fish or vegetables using olive oil, lemon juice, garlic, and fresh herbs. Meal planning  Plan to eat 1 vegetarian meal one day each week. Try to work up to 2 vegetarian meals, if possible. Eat seafood 2 or more times a week. Have healthy snacks readily available, such as: Vegetable sticks with hummus. Greek yogurt. Fruit and nut trail mix. Eat balanced meals throughout the week. This includes: Fruit: 2-3 servings a day Vegetables: 4-5 servings a day Low-fat dairy: 2 servings a day Fish, poultry, or lean meat: 1 serving a day Beans and legumes: 2 or more servings a week Nuts and seeds: 1-2 servings a day Whole grains: 6-8 servings a day Extra-virgin olive oil: 3-4 servings a day Limit red meat and sweets to only a few servings a month What are my food choices? Mediterranean diet Recommended Grains: Whole-grain pasta. Brown rice. Bulgar wheat. Polenta. Couscous. Whole-wheat bread. Orpah Cobb. Vegetables: Artichokes. Beets. Broccoli. Cabbage. Carrots. Eggplant. Green beans. Chard. Kale. Spinach. Onions. Leeks. Peas. Squash. Tomatoes. Peppers. Radishes. Fruits: Apples. Apricots. Avocado. Berries. Bananas. Cherries. Dates. Figs. Grapes. Lemons. Melon. Oranges. Peaches. Plums. Pomegranate. Meats  and other protein foods: Beans. Almonds. Sunflower seeds. Pine nuts. Peanuts. Cod. Salmon. Scallops. Shrimp. Tuna. Tilapia. Clams. Oysters. Eggs. Dairy: Low-fat milk. Cheese. Greek yogurt. Beverages: Water. Red wine. Herbal tea. Fats and oils: Extra virgin olive oil. Avocado oil. Grape seed oil. Sweets and desserts: [place] yogurt with honey. Baked apples. Poached pears. Trail mix. Seasoning and other foods: Basil. Cilantro. Coriander. Cumin. Mint. Parsley. Sage. Rosemary. Tarragon. Garlic. Oregano. Thyme. Pepper. Balsalmic vinegar. Tahini. Hummus. Tomato sauce. Olives. Mushrooms. Limit these Grains: Prepackaged pasta or rice dishes. Prepackaged cereal with added sugar. Vegetables: Deep fried potatoes (french fries). Fruits: Fruit canned in syrup. Meats and other protein foods: Beef. Pork. Lamb. Poultry with skin. Hot dogs. Tomasa Blase. Dairy: Ice cream. Sour cream. Whole milk. Beverages: Juice. Sugar-sweetened soft drinks. Beer. Liquor and spirits. Fats and oils: Butter. Canola oil. Vegetable oil. Beef fat (tallow). Lard. Sweets and desserts: Cookies. Cakes. Pies. Candy. Seasoning and other foods: Mayonnaise. Premade sauces and marinades. The items listed may not be a complete list. Talk with your dietitian about what dietary choices are right for you. Summary The Mediterranean diet includes both food and lifestyle  choices. Eat a variety of fresh fruits and vegetables, beans, nuts, seeds, and whole grains. Limit the amount of red meat and sweets that you eat. Talk with your health care provider about whether it is safe for you to drink red wine in moderation. This means 1 glass a day for nonpregnant women and 2 glasses a day for men. A glass of wine equals 5 oz (150 mL). This information is not intended to replace advice given to you by your health care provider. Make sure you discuss any questions you have with your health care provider. Document Released: 08/01/2016 Document Revised: 09/03/2016  Document Reviewed: 08/01/2016 Elsevier Interactive Patient Education  2017 [employer].  Your provider has requested that you have labwork completed today. Please go to [facility] ([id number]) on the second floor of this building before leaving the office today. You do not need to check in. If you are not called within 15 minutes please check with the front desk.

## 2023-07-29 NOTE — Progress Notes (Signed)
Assessment/Plan:   Mild cognitive impairment of unclear etiology  Justin [name] is a very pleasant 82 y.o. RH male retired Occupational hygienist, with a history of hypertension, DM2,  hyperlipidemia, bradycardia, history of TIA 2017, anemia of chronic disease, multiple Myeloma in remission and a recent diagnosis of mild cognitive impairment of unclear etiology at this time, presenting today in follow-up for evaluation of memory loss. Patient is not on antidementia medication.  We discussed the side effects of memantine (history of bradycardia) and patient will prefer to start at the lowest dose possible and monitor for any side effects.  He is able to perform all his ADLs without difficulty.  He is to drive without any issues.     Recommendations:   Follow up in 6  months. Repeat neuropsych evaluation in 1 or 2 years for clarity of the diagnosis and disease trajectory Start memantine 5 mg bid,  side effects discussed (bradycardia) New B12 replenishment Recommend good control of cardiovascular risk factors Continue to control mood as per PCP    Subjective:   This patient is here alone.. Previous records as well as any outside records available were reviewed prior to todays visit.   Patient was last seen on January 02, 2023.  Last MMSE on October 2023 was 26/30     Any changes in memory since last visit? " A little bit worse".  He has mild difficulty remembering recent conversations and names of people,come out with brands, new information.  He does not like to do any brain games.  He continues to be independent, not needing any help with ADLs.  "However, I cannot fly via plane anymore, I have to be the passenger " repeats oneself?  Endorsed Disoriented when walking into a room?  Patient denies  Leaving objects in unusual places?  Patient denies   Wandering behavior?   denies   Any personality changes since last visit? denies   Any worsening depression?: denies   Hallucinations or paranoia?  denies    Seizures?   denies    Any sleep changes? Sleeps about 6 hours.   Denies vivid dreams, REM behavior or sleepwalking   Sleep apnea?   denies   Any hygiene concerns?   denies   Independent of bathing and dressing?  Endorsed  Does the patient needs help with medications?  Patient is in charge  Who is in charge of the finances?  Patient is in charge    Any changes in appetite?  denies    Patient have trouble swallowing?  denies   Does the patient cook? Yes    Any kitchen accidents such as leaving the stove on?   denies   Any headaches?    denies   Vision changes? denies Chronic pain?  denies   Ambulates with difficulty?  Denies. Has a cane but he does not like to use it     Recent falls or head injuries?  denies      Unilateral weakness, numbness or tingling?   denies   Any tremors?  denies   Any anosmia?    denies   Any incontinence of urine?  Endorsed, uses a urinal due to nocturia, wears a pad "just in case " Any bowel dysfunction?  Sometimes has diarrhea.  Patient lives with his wife and stepson    Does the patient drive?  Yes denies getting lost.  No new MVAs.  Personally reviewed the MRI of the brain in September 2023 without acute intracranial abnormalities, remarkable for chronic  microvascular ischemic disease without white matter without pathologic enhancement, and essentially normal volume for age.  Initial visit 10/11/20203 How long did patient have memory difficulties?  For the last year, worse over the 2 months have more difficulty with names of known people including daughter and grand-daughter, missing appointments, and sometimes he forgets in the middle of the conversation when he was talking about.  He reports that he is STM is worse than LTM. repeats oneself? Endorsed  Disoriented when walking into a room?  Patient denies   Leaving objects in unusual places?  Patient denies   Patient lives with wife and her son  Ambulates  with difficulty?   Feels a little unsteady due  to a history of peripheral neuropathy due to diabetes, not recently fell on the "rear end" without LOC or head injury. Set to participate in PT/OT.  Otherwise, he walks daily and works on the yard.  History of seizures?   Patient denies   Wandering behavior?  Patient denies   Patient drives?   No issues  Any mood changes ?  "Sometimes I get upset with myself because I can't remember something and can be frustrating ".  Any depression?:  Patient denies   Hallucinations?  Patient denies   Paranoia?  Patient denies   Patient reports that sleeps well without vivid dreams, REM behavior or sleepwalking .   History of sleep apnea?  Patient denies   Any hygiene concerns?  Patient denies   Independent of bathing and dressing?  Endorsed  Does the patient needs help with medications?   Patient in charge  Who is in charge of the finances? Patient  is in charge   Any changes in appetite?  Patient denies   Patient have trouble swallowing? Patient denies   Does the patient cook?  Patient denies   Any kitchen accidents such as leaving the stove on? Patient denies   Any headaches?  Patient denies   Double vision? Patient denies   Any focal numbness or tingling?  Has bilateral diabetic neuropathy and spinal stenosis, he is on Lyrica tolerating well.  Chronic back pain?  Patient denies   Unilateral weakness?  Patient denies   Any tremors?  Patient denies   Any history of anosmia?  Patient denies   Any incontinence of urine?  Wears pads "just in case because I may dribble" Any bowel dysfunction?   Intermittent diarrhea or constipation due to Revlimid  History of heavy alcohol intake?  Patient denies   History of heavy tobacco use?  Patient denies   Family history of dementia?   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/30/2018   Gait abnormality    GERD (gastroesophageal reflux disease)    Gout    History of loop recorder    History of squamous cell carcinoma    Hyperlipidemia 12/13/2014   IPMN (intraductal papillary mucinous neoplasm) 02/20/2022   Left leg weakness    Low back pain 01/30/2018   Lumbar radiculopathy 01/30/2018   Mild cognitive impairment of uncertain or unknown etiology 07/16/2023   Multiple lacunar infarcts 2017  Bilateral basal ganglia; right frontal white matter     Multiple myeloma without remission 03/10/2019   Numbness 06/01/2016   Osteoporosis 03/10/2019   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   Right arm numbness 06/01/2016   S/P RF ablation operation for arrhythmia 12/13/2014   TIA (transient ischemic attack) 06/02/2016     Past Surgical History:  Procedure Laterality Date   ATRIAL FIBRILLATION ABLATION     Dr. Ladona Ridgel   BASAL CELL CARCINOMA EXCISION     off of back   ELECTROPHYSIOLOGIC STUDY N/A 05/01/2015   Procedure: SVT Ablation;  Surgeon: Marinus Maw, MD;  Location[facility] INVASIVE CV LAB;  Service: Cardiovascular;  Laterality: N/A;   EP IMPLANTABLE DEVICE N/A 06/04/2016   Procedure: Loop Recorder Insertion;  Surgeon: Hillis Range, MD;  Location: [facility] INVASIVE CV LAB;  Service: Cardiovascular;  Laterality: N/A;   implantable loop recorder removal     ILR removed by Dr Johney Frame   INGUINAL HERNIA REPAIR Bilateral 06/16/2017   Procedure: LAPAROSCOPIC BILATERAL INGUINAL HERNIA REPAIR;  Surgeon: Berna Bue, MD;  Location: [facility];  Service: General;  Laterality: Bilateral;   INSERTION OF MESH Bilateral 06/16/2017   Procedure: INSERTION OF MESH;  Surgeon: Berna Bue, MD;  Location: [facility] OR;  Service: General;  Laterality: Bilateral;   MASS EXCISION Right 07/28/2018   Procedure: EXCISION OF RIGHT FOREARM MASS;  Surgeon: Berna Bue, MD;  Location: [facility] ORS;  Service: General;  Laterality: Right;   SQUAMOUS CELL CARCINOMA EXCISION     TEE WITHOUT CARDIOVERSION N/A 06/04/2016   Procedure: TRANSESOPHAGEAL ECHOCARDIOGRAM (TEE);  Surgeon: Chrystie Nose, MD;  Location: [facility] ENDOSCOPY;  Service: Cardiovascular;  Laterality: N/A;     PREVIOUS MEDICATIONS:   CURRENT MEDICATIONS:  Outpatient Encounter Medications as of 07/29/2023  Medication Sig   cyanocobalamin 1000 MCG tablet Take 1,000 mcg by mouth daily.   FREESTYLE LITE test strip    glucose monitoring kit (FREESTYLE) monitoring kit 1 each by Does not apply route as needed for other.   Lancets (FREESTYLE) lancets 1 each daily.   lenalidomide (REVLIMID) 5 MG capsule TAKE 1 CAPSULE DAILY FOR 21 DAYS ON THEN 7 DAYS OFF. REPEAT EVERY 28 DAYS   Magnesium 400 MG CAPS Take 400 mg by mouth daily.   memantine (NAMENDA) 5 MG tablet Take 1 tablet (5 mg total) by mouth at bedtime.   metFORMIN (GLUCOPHAGE) 1000 MG tablet TAKE ONE-HALF (1/2) TABLET DAILY WITH BREAKFAST   omeprazole (PRILOSEC) 20 MG capsule Take 20 mg by mouth every morning.    Potassium 99 MG TABS 1 tablet   pravastatin (PRAVACHOL) 10 MG tablet Take 1 tablet (10 mg total) by mouth daily.   pregabalin (LYRICA) 75 MG capsule Take 75 mg by mouth 3 (three) times daily.   rivaroxaban (XARELTO) 20 MG TABS tablet TAKE 1 TABLET DAILY WITH SUPPER   Vitamin D, Ergocalciferol, (DRISDOL) 1.25 MG (50000 UNIT) CAPS capsule TAKE 1 CAPSULE BY MOUTH 1 TIME A WEEK   [DISCONTINUED] denosumab (PROLIA) 60 MG/ML SOSY injection See admin instructions.   denosumab (PROLIA) 60 MG/ML SOSY injection Inject 60 mg into the skin every 6 (six) months.   nitroGLYCERIN (NITROSTAT) 0.4 MG SL tablet Place 1 tablet (0.4 mg total) under the tongue every 5 (five) minutes as  needed for chest pain (Max 3 doses in 15 mins.). (Patient not taking: Reported on  07/29/2023)   No facility-administered encounter medications on file as of 07/29/2023.     Objective:     PHYSICAL EXAMINATION:    VITALS:   Vitals:   07/29/23 0914  Pulse: 86  Resp: 20  SpO2: 100%  Weight: 199 lb (90.3 kg)  Height: 5\' 8"  (1.727 m)    GEN:  The patient appears stated age and is in NAD. HEENT:  Normocephalic, atraumatic.   Neurological examination:  General: NAD, well-groomed, appears stated age. Orientation: The patient is alert. Oriented to person, place and date Cranial nerves: There is good facial symmetry.The speech is fluent and clear. No aphasia or dysarthria. Fund of knowledge is appropriate. Recent memory impaired and remote memory is normal.  Attention and concentration are normal.  Able to name objects and repeat phrases.  Hearing is intact to conversational tone    Sensation: Sensation is intact to light touch throughout Motor: Strength is at least antigravity x4. DTR's 2/4 in UE/LE      10/03/2022    7:00 AM  Montreal Cognitive Assessment   Visuospatial/ Executive (0/5) 5  Naming (0/3) 3  Attention: Read list of digits (0/2) 2  Attention: Read list of letters (0/1) 1  Attention: Serial 7 subtraction starting at 100 (0/3) 3  Language: Repeat phrase (0/2) 2  Language : Fluency (0/1) 1  Abstraction (0/2) 2  Delayed Recall (0/5) 2  Orientation (0/6) 5  Total 26  Adjusted Score (based on education) 26        No data to display             Movement examination: Tone: There is normal tone in the UE/LE Abnormal movements:  no tremor.  No myoclonus.  No asterixis.   Coordination:  There is no decremation with RAM's. Normal finger to nose  Gait and Station: The patient has no difficulty arising out of a deep-seated chair without the use of the hands. The patient's stride length is good.  Gait is cautious and narrow.   Thank you for allowing [place] the  opportunity to participate in the care of this nice patient. Please do not hesitate to contact us for any questions or concerns.   Total time spent on today's visit was 22 minutes dedicated to this patient today, preparing to see patient, examining the patient, ordering tests and/or medications and counseling the patient, documenting clinical information in the EHR or other health record, independently interpreting results and communicating results to the patient/family, discussing treatment and goals, answering patient's questions and coordinating care.  Cc:  Lupita Raider, MD  Marlowe Kays 07/29/2023 9:51 AM

## 2023-07-30 ENCOUNTER — Inpatient Hospital Stay: Payer: Medicare Other | Attending: Hematology

## 2023-07-30 DIAGNOSIS — Z8673 Personal history of transient ischemic attack (TIA), and cerebral infarction without residual deficits: Secondary | ICD-10-CM | POA: Insufficient documentation

## 2023-07-30 DIAGNOSIS — M549 Dorsalgia, unspecified: Secondary | ICD-10-CM | POA: Insufficient documentation

## 2023-07-30 DIAGNOSIS — Z7901 Long term (current) use of anticoagulants: Secondary | ICD-10-CM | POA: Insufficient documentation

## 2023-07-30 DIAGNOSIS — I480 Paroxysmal atrial fibrillation: Secondary | ICD-10-CM | POA: Insufficient documentation

## 2023-07-30 DIAGNOSIS — Z79899 Other long term (current) drug therapy: Secondary | ICD-10-CM | POA: Insufficient documentation

## 2023-07-30 DIAGNOSIS — G8929 Other chronic pain: Secondary | ICD-10-CM | POA: Insufficient documentation

## 2023-07-30 DIAGNOSIS — D649 Anemia, unspecified: Secondary | ICD-10-CM | POA: Insufficient documentation

## 2023-07-30 DIAGNOSIS — E538 Deficiency of other specified B group vitamins: Secondary | ICD-10-CM | POA: Insufficient documentation

## 2023-07-30 DIAGNOSIS — M5410 Radiculopathy, site unspecified: Secondary | ICD-10-CM | POA: Insufficient documentation

## 2023-07-30 DIAGNOSIS — D696 Thrombocytopenia, unspecified: Secondary | ICD-10-CM | POA: Insufficient documentation

## 2023-07-30 DIAGNOSIS — C9000 Multiple myeloma not having achieved remission: Secondary | ICD-10-CM | POA: Insufficient documentation

## 2023-08-05 ENCOUNTER — Inpatient Hospital Stay: Payer: Medicare Other

## 2023-08-05 ENCOUNTER — Other Ambulatory Visit: Payer: Self-pay

## 2023-08-05 DIAGNOSIS — G8929 Other chronic pain: Secondary | ICD-10-CM | POA: Diagnosis not present

## 2023-08-05 DIAGNOSIS — M549 Dorsalgia, unspecified: Secondary | ICD-10-CM | POA: Diagnosis not present

## 2023-08-05 DIAGNOSIS — D696 Thrombocytopenia, unspecified: Secondary | ICD-10-CM | POA: Diagnosis not present

## 2023-08-05 DIAGNOSIS — Z8673 Personal history of transient ischemic attack (TIA), and cerebral infarction without residual deficits: Secondary | ICD-10-CM | POA: Diagnosis not present

## 2023-08-05 DIAGNOSIS — C9000 Multiple myeloma not having achieved remission: Secondary | ICD-10-CM | POA: Diagnosis not present

## 2023-08-05 DIAGNOSIS — Z79899 Other long term (current) drug therapy: Secondary | ICD-10-CM | POA: Diagnosis not present

## 2023-08-05 DIAGNOSIS — Z7901 Long term (current) use of anticoagulants: Secondary | ICD-10-CM | POA: Diagnosis not present

## 2023-08-05 DIAGNOSIS — E538 Deficiency of other specified B group vitamins: Secondary | ICD-10-CM | POA: Diagnosis not present

## 2023-08-05 DIAGNOSIS — I480 Paroxysmal atrial fibrillation: Secondary | ICD-10-CM | POA: Diagnosis not present

## 2023-08-05 DIAGNOSIS — D649 Anemia, unspecified: Secondary | ICD-10-CM | POA: Diagnosis not present

## 2023-08-05 DIAGNOSIS — M5410 Radiculopathy, site unspecified: Secondary | ICD-10-CM | POA: Diagnosis not present

## 2023-08-05 LAB — CMP (CANCER CENTER ONLY)
ALT: 8 U/L (ref 0–44)
AST: 16 U/L (ref 15–41)
Albumin: 3.7 g/dL (ref 3.5–5.0)
Alkaline Phosphatase: 52 U/L (ref 38–126)
Anion gap: 7 (ref 5–15)
BUN: 21 mg/dL (ref 8–23)
CO2: 24 mmol/L (ref 22–32)
Calcium: 8 mg/dL — ABNORMAL LOW (ref 8.9–10.3)
Chloride: 109 mmol/L (ref 98–111)
Creatinine: 1.19 mg/dL (ref 0.61–1.24)
GFR, Estimated: >60 mL/min (ref >60)
Glucose, Bld: 157 mg/dL — ABNORMAL HIGH (ref 70–99)
Potassium: 4.1 mmol/L (ref 3.5–5.1)
Sodium: 140 mmol/L (ref 135–145)
Total Bilirubin: 0.5 mg/dL (ref 0.3–1.2)
Total Protein: 6.1 g/dL — ABNORMAL LOW (ref 6.5–8.1)

## 2023-08-05 LAB — CBC WITH DIFFERENTIAL (CANCER CENTER ONLY)
Abs Immature Granulocytes: 0.01 10*3/uL (ref 0.00–0.07)
Basophils Absolute: 0.1 10*3/uL (ref 0.0–0.1)
Basophils Relative: 2 %
Eosinophils Absolute: 0.1 10*3/uL (ref 0.0–0.5)
Eosinophils Relative: 2 %
HCT: 36.4 % — ABNORMAL LOW (ref 39.0–52.0)
Hemoglobin: 12.5 g/dL — ABNORMAL LOW (ref 13.0–17.0)
Immature Granulocytes: 0 %
Lymphocytes Relative: 25 %
Lymphs Abs: 1 10*3/uL (ref 0.7–4.0)
MCH: 31.2 pg (ref 26.0–34.0)
MCHC: 34.3 g/dL (ref 30.0–36.0)
MCV: 90.8 fL (ref 80.0–100.0)
Monocytes Absolute: 0.3 10*3/uL (ref 0.1–1.0)
Monocytes Relative: 8 %
Neutro Abs: 2.4 10*3/uL (ref 1.7–7.7)
Neutrophils Relative %: 63 %
Platelet Count: 139 10*3/uL — ABNORMAL LOW (ref 150–400)
RBC: 4.01 MIL/uL — ABNORMAL LOW (ref 4.22–5.81)
RDW: 14.3 % (ref 11.5–15.5)
WBC Count: 3.9 10*3/uL — ABNORMAL LOW (ref 4.0–10.5)
nRBC: 0 % (ref 0.0–0.2)

## 2023-08-06 ENCOUNTER — Inpatient Hospital Stay: Payer: Medicare Other | Admitting: Hematology

## 2023-08-06 ENCOUNTER — Other Ambulatory Visit: Payer: Self-pay

## 2023-08-06 VITALS — BP 137/68 | HR 52 | Temp 97.4°F | Resp 18 | Wt 201.3 lb

## 2023-08-06 DIAGNOSIS — D649 Anemia, unspecified: Secondary | ICD-10-CM | POA: Diagnosis not present

## 2023-08-06 DIAGNOSIS — G8929 Other chronic pain: Secondary | ICD-10-CM | POA: Diagnosis not present

## 2023-08-06 DIAGNOSIS — C9000 Multiple myeloma not having achieved remission: Secondary | ICD-10-CM | POA: Diagnosis not present

## 2023-08-06 DIAGNOSIS — D696 Thrombocytopenia, unspecified: Secondary | ICD-10-CM | POA: Diagnosis not present

## 2023-08-06 DIAGNOSIS — I480 Paroxysmal atrial fibrillation: Secondary | ICD-10-CM | POA: Diagnosis not present

## 2023-08-06 DIAGNOSIS — M549 Dorsalgia, unspecified: Secondary | ICD-10-CM | POA: Diagnosis not present

## 2023-08-12 NOTE — Progress Notes (Signed)
HEMATOLOGY/ONCOLOGY CLINIC NOTE  Date of Service: 08/06/2023    Patient Care Team: Lupita Raider, MD as PCP - General (Family Medicine) Rennis Golden Lisette Abu, MD as PCP - Cardiology (Cardiology)   CHIEF COMPLAINTS:  Follow-up for continued evaluation and management of multiple myeloma  HISTORY OF PRESENTING ILLNESS:  Please see previous note for details of initial presentation  CURRENT THERAPY:   Revlimid maintenance 5 mg po 3 weeks on 1 week off.   INTERVAL HISTORY:  Justin [name] is a 82 y.o. male who is here for continued evaluation and management of his multiple myeloma . Patient continues to be on his maintenance Revlimid. He notes no acute new symptoms since his last clinic visit. Chronic back pain issues. No significant infection issues. Has been able to maintain good compliance with his Revlimid.  MEDICAL HISTORY:  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/30/2018   Gait abnormality    GERD (gastroesophageal reflux disease)    Gout    History of loop recorder    History of squamous cell carcinoma    Hyperlipidemia 12/13/2014   IPMN (intraductal papillary mucinous neoplasm) 02/20/2022   Left leg weakness    Low back pain 01/30/2018   Lumbar radiculopathy 01/30/2018   Mild cognitive impairment of uncertain or unknown etiology 07/16/2023   Multiple lacunar infarcts 2017   Bilateral basal ganglia; right frontal white matter     Multiple myeloma without remission 03/10/2019   Numbness 06/01/2016   Osteoporosis 03/10/2019   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 Ladona Ridgel   BASAL CELL CARCINOMA EXCISION     off of back   ELECTROPHYSIOLOGIC STUDY N/A 05/01/2015   Procedure: SVT Ablation;  Surgeon: Marinus Maw, MD;  Location: [facility] INVASIVE CV LAB;  Service: Cardiovascular;  Laterality: N/A;   EP IMPLANTABLE DEVICE N/A 06/04/2016   Procedure: Loop Recorder Insertion;  Surgeon: Hillis Range, MD;  Location: [facility] INVASIVE CV LAB;  Service: Cardiovascular;  Laterality: N/A;   implantable loop recorder removal     ILR removed by Dr Johney Frame   INGUINAL HERNIA REPAIR Bilateral 06/16/2017   Procedure: LAPAROSCOPIC BILATERAL INGUINAL HERNIA REPAIR;  Surgeon: Berna Bue, MD;  Location: [facility] OR;  Service: General;  Laterality: Bilateral;   INSERTION OF MESH Bilateral 06/16/2017   Procedure: INSERTION OF MESH;  Surgeon: Berna Bue, MD;  Location: [facility] OR;  Service: General;  Laterality: Bilateral;   MASS EXCISION Right 07/28/2018   Procedure: EXCISION OF RIGHT FOREARM MASS;  Surgeon: Berna Bue, MD;  Location: [facility] ORS;  Service: General;  Laterality: Right;   SQUAMOUS CELL CARCINOMA EXCISION     TEE WITHOUT CARDIOVERSION N/A 06/04/2016   Procedure: TRANSESOPHAGEAL ECHOCARDIOGRAM (TEE);  Surgeon: Chrystie Nose, MD;  Location: [facility] ENDOSCOPY;  Service: Cardiovascular;  Laterality: N/A;    SOCIAL HISTORY: Social History   Socioeconomic History   Marital status: Married    Spouse name: Not on file   Number of children: 3   Years of education: 17   Highest education level: Bachelor's degree (e.g., BA, AB, BS)  Occupational History   Occupation: Retired    Comment: Art gallery manager  Tobacco Use   Smoking status: Never   Smokeless tobacco: Never  Vaping Use    Vaping status: Never Used  Substance and Sexual Activity   Alcohol use: No   Drug use: No   Sexual activity: Not on file  Other Topics Concern   Not on file  Social History Narrative   Right handed   No caffeine   Two story home   Retired   Surveyor, quantity   Social Determinants of Corporate investment banker Strain: Not on file  Food Insecurity: Not on file  Transportation Needs: Not on file  Physical Activity: Not on file  Stress: Not on file  Social Connections: Not on file  Intimate Partner Violence: Not on file   Justin [name] is retired. His daughter is Interior and spatial designer of nursing for a nursing home in [place], [place].    FAMILY HISTORY: Family History  Problem Relation Age of Onset   Stroke Mother    Hypertension Mother    Language disorder Mother        Aphasia; unknown cause   Alzheimer's disease Father    Heart failure Father    Dementia Sister        Lewy body   Down syndrome Daughter     ALLERGIES:  is allergic to other and no known allergies.  MEDICATIONS:  Current Outpatient Medications  Medication Sig Dispense Refill   cyanocobalamin 1000 MCG tablet Take 1,000 mcg by mouth daily.     denosumab (PROLIA) 60 MG/ML SOSY injection Inject 60 mg into the skin every 6 (six) months.     FREESTYLE LITE test strip      glucose monitoring kit (FREESTYLE) monitoring kit 1 each by Does not apply route as needed for other.     Lancets (FREESTYLE) lancets 1 each daily.     lenalidomide (REVLIMID) 5 MG capsule TAKE 1 CAPSULE DAILY FOR 21 DAYS ON THEN 7 DAYS OFF. REPEAT EVERY 28 DAYS 21 capsule 0   Magnesium 400 MG CAPS Take 400 mg by mouth daily.     memantine (NAMENDA) 5 MG tablet Take 1 tablet (5 mg total) by mouth at bedtime. 30 tablet 11   metFORMIN (GLUCOPHAGE) 1000 MG tablet TAKE ONE-HALF (1/2) TABLET DAILY WITH BREAKFAST 90 tablet 3   nitroGLYCERIN (NITROSTAT) 0.4 MG SL tablet Place 1 tablet (0.4 mg total) under the tongue every 5 (five) minutes as needed for chest pain (Max 3  doses in 15 mins.). (Patient not taking: Reported on 07/29/2023) 30 tablet 0   omeprazole (PRILOSEC) 20 MG capsule Take 20 mg by mouth every morning.      Potassium 99 MG TABS 1 tablet     pravastatin (PRAVACHOL) 10 MG tablet Take 1 tablet (10 mg total) by mouth daily. 90 tablet 2   pregabalin (LYRICA) 75 MG capsule Take 75 mg by mouth 3 (three) times daily.     rivaroxaban (XARELTO) 20 MG TABS tablet TAKE 1 TABLET DAILY WITH SUPPER 90 tablet 2   Vitamin D, Ergocalciferol, (DRISDOL) 1.25 MG (50000 UNIT) CAPS capsule TAKE 1 CAPSULE BY MOUTH 1 TIME A WEEK 12 capsule 5  No current facility-administered medications for this visit.    REVIEW OF SYSTEMS:    10 Point review of Systems was done is negative except as noted above.   PHYSICAL EXAMINATION:   ECOG PERFORMANCE STATUS: 2 - Symptomatic, <50% confined to bed Vitals:   08/06/23 0927  BP: 137/68  Pulse: (!) 52  Resp: 18  Temp: (!) 97.4 F (36.3 C)  SpO2: 97%    Wt Readings from Last 3 Encounters:  08/06/23 201 lb 4.8 oz (91.3 kg)  07/29/23 199 lb (90.3 kg)  05/14/23 199 lb 9.6 oz (90.5 kg)   Body mass index is 30.61 kg/m.  NAD GENERAL:alert, in no acute distress and comfortable SKIN: no acute rashes, no significant lesions EYES: conjunctiva are pink and non-injected, sclera anicteric OROPHARYNX: MMM, no exudates, no oropharyngeal erythema or ulceration NECK: supple, no JVD LYMPH:  no palpable lymphadenopathy in the cervical, axillary or inguinal regions LUNGS: clear to auscultation b/l with normal respiratory effort HEART: regular rate & rhythm ABDOMEN:  normoactive bowel sounds , non tender, not distended. Extremity: no pedal edema PSYCH: alert & oriented x 3 with fluent speech NEURO: no focal motor/sensory deficits   LABORATORY DATA:  I have reviewed the data as listed.     Latest Ref Rng & Units 08/05/2023    3:22 PM 05/07/2023    9:17 AM 02/04/2023   11:52 AM  CBC  WBC 4.0 - 10.5 K/uL 3.9  2.6  2.9    Hemoglobin 13.0 - 17.0 g/dL 51.8  84.1  66.0   Hematocrit 39.0 - 52.0 % 36.4  40.2  41.1   Platelets 150 - 400 K/uL 139  132  129   ANC 1.5k     Latest Ref Rng & Units 08/05/2023    3:22 PM 05/07/2023    9:17 AM 02/04/2023   11:52 AM  CMP  Glucose 70 - 99 mg/dL 630  160  109   BUN 8 - 23 mg/dL 21  20  21    Creatinine 0.61 - 1.24 mg/dL 3.23  5.57  3.22   Sodium 135 - 145 mmol/L 140  138  139   Potassium 3.5 - 5.1 mmol/L 4.1  4.5  4.3   Chloride 98 - 111 mmol/L 109  103  106   CO2 22 - 32 mmol/L 24  31  27    Calcium 8.9 - 10.3 mg/dL 8.0  9.0  9.1   Total Protein 6.5 - 8.1 g/dL 6.1  6.6  6.8   Total Bilirubin 0.3 - 1.2 mg/dL 0.5  0.7  0.7   Alkaline Phos 38 - 126 U/L 52  54  59   AST 15 - 41 U/L 16  19  19    ALT 0 - 44 U/L 8  8  12          RADIOGRAPHIC STUDIES: I have personally reviewed the radiological images as listed and agreed with the findings in the report.       Bone Marrow Biopsy 12/24/2016 (Accession [id number])  Diagnosis Bone Marrow, Aspirate,Biopsy, and Clot, left iliac crest - HYPERCELLULAR BONE MARROW FOR AGE WITH PLASMA CELL NEOPLASM. - SEE COMMENT. PERIPHERAL BLOOD: - NORMOCYTIC-NORMOCHROMIC ANEMIA. - LEUKOPENIA.  DG Bone Survey Met (Accession [id number]) (Order [id number])  Imaging  Date: 11/28/2016 Department: [facility]RAD[facility] Released By: Kenton Kingfisher Authorizing: Johney Maine, MD  Exam Information   Status Exam [facility]  Exam Ended   Final 99 11/28/2016 11:59 AM 11/28/2016 12:36 PM  PACS Images  Show images for DG Bone Survey Met  Study Result   CLINICAL DATA:  Monoclonal gammopathy of unknown significance. History of squamous cell carcinoma excision, basal cell carcinoma excision.   EXAM: METASTATIC BONE SURVEY   COMPARISON:  CT neck, chest, abdomen and pelvis from 10/29/16, MRI of the head from 06/01/2016, CXR 10/27/2016, lumbar spine radiographs 06/24/2016   FINDINGS: Lateral skull: Small  occipital lucency which may represent a normal arachnoid granulation posteriorly in the occiput. No definite lytic abnormality.   Cervical spine AP and lateral: Disc space narrowing at C2-3, and from C4 through C7. C4-5, C5-6 and C6-7 uncovertebral joint spurring bilaterally. No lytic abnormality.   Thoracic spine AP and lateral: T8-9 right-sided osteophytes and to a lesser degree T9-10. No lytic abnormality. Slight multilevel lumbar thoracic disc space narrowing likely degenerative.   Lumbar spine AP and lateral: Disc space narrowing at L4-5 and to a greater extent L5-S1. No lytic disease. L3 through S1 facet sclerosis.   AP pelvis: Negative for lytic disease.   Bilateral upper and lower extremities shoulders through wrist and from the hips through ankle: Negative for lytic disease. Joint space narrowing of the femorotibial compartments both knees. Subchondral cyst of the right patella.   CXR: Clear lungs. Cardiac implantable monitoring device projects over the left heart. Aortic atherosclerosis. No lytic disease.   IMPRESSION: No findings suspicious for lytic disease.     Electronically Signed   By: Tollie Eth M.D.   On: 11/28/2016 14:58     ASSESSMENT & PLAN:   82 y.o. 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 (12/2016) Peak M spike 2.6  Cytogenetics and Myeloma FISH panel.- trisomy 11 Patient has normocytic anemia without any other clear etiology. (>2g/dl lower that lower limit of normal which is 13) which per criteria would place this in the Multiple myeloma category as opposed to Smoldering Multiple myeloma (Borderline criterion)  No overt renal failure or hypercalcemia at this time No focal bone pains though he has significant chronic back pain related to degenerative disc disease. Bone survey shows no overt lytic lesions.  10/16/18 CT Coronary Morph revealed Coronary artery calcium  score 299 Agatston units, this places the patient in the 47th percentile for age and gender, suggesting intermediate risk for future cardiac events. 2. Nonobstructive coronary disease.  12/08/18 Bone Density study revealed that the pt has osteoporosis  2) Anemia and thrombocytopenia -- related to treatment (Revlimid) -stable  3) Grade 1 Neuropathy/radiculopathy-- in b/l feet and halfway up lower leg without pain  4) Borderline low B12 levels, previously 299 --- on replacement -  -continue replacement.  5) h/o recurrent CVA - thought to be related to small vessel disease as per neurology. Has a small PFO which could be an additional risk factor as well as his afib  6)  P afib Plan -Continue on Xarelto per cardiology.  7) H/o recurrent SCC -Underwent surgical excision on right forearm with Dr. Phylliss Blakes on 07/28/18. His surgical pathology showed evidence of Squamous Cell Carcinoma. Plan -continue dermatology followup  10) Lower extremity discomfort - improved, stable. Likely Discogenic pain -[place] on 12/2017 revealed no blood clot -Pt has established care with orthopedist and PT. - has had muscle cramps from his statins and this is being mx by his cardiologist.  11) Patient Active Problem List   Diagnosis Date Noted   Mild cognitive impairment of uncertain or unknown etiology 07/16/2023  History of squamous cell carcinoma    IPMN (intraductal papillary mucinous neoplasm) 02/20/2022   Osteoporosis 03/10/2019   Multiple myeloma without remission 03/10/2019   Chest pain 09/27/2018   Degeneration of lumbar intervertebral disc 01/30/2018   Low back pain 01/30/2018   Lumbar radiculopathy 01/30/2018   History of loop recorder 06/12/2017   Gait abnormality    Benign essential hypertension    Paroxysmal SVT (supraventricular tachycardia)    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   TIA (transient ischemic attack) 06/02/2016   Right arm numbness  06/01/2016   Atherosclerosis of native coronary artery of native heart without angina pectoris 03/20/2016   Multiple lacunar infarcts 2017   GERD (gastroesophageal reflux disease)    Gout    S/P RF ablation operation for arrhythmia 12/13/2014   Hyperlipidemia 12/13/2014   Controlled type 2 diabetes mellitus with complication, without long-term current use of insulin 12/07/2014    PLAN:  -Discussed lab results from 08/05/2023 CBC shows mild anemia with a hemoglobin of 12.  5 WBC count of 3.9k and platelets of 139k CMP stable Myeloma panel shows stable M spike of 0.2 g/dL of IgG lambda monoclonal protein. No notable toxicities from his current dose of Revlimid and will continue 5 mg of Revlimid 3 weeks on 1 week off No notable new focal symptoms suggestive of disease progression  Follow-up  Return to clinic with Dr. Candise Che in 12 weeks and labs in 11 weeks   The total time spent in the appointment was 55 minutes*.  All of the patient's questions were answered with apparent satisfaction. The patient knows to call the clinic with any problems, questions or concerns.   Wyvonnia Lora MD MS AAHIVMS [facility] Hematology/Oncology Physician [facility]  .*Total Encounter Time as defined by the Centers for Medicare and Medicaid Services includes, in addition to the face-to-face time of a patient visit (documented in the note above) non-face-to-face time: obtaining and reviewing outside history, ordering and reviewing medications, tests or procedures, care coordination (communications with other health care professionals or caregivers) and documentation in the medical record.                                                                                                                -CMP normal, mild dehydration -Stable low normal WBC level -K/L light chains normal -myeloma  panel shows M spike of 0.2g/dl -last myeloma panel 03/31/8118 showed M protein of 0.1 -Patient has no clinical evidence of myeloma progression at this time. -recommend patient to drink 40-60 oz of water daily -discussed option of PEA supplement 600 MG OTC to improve chemotherapy-related neuropathy - no prohibitive toxicities from Revlimid maintenance at this time. -Continue Revlimid 5 mg as prescribed.  -Continue with Prolia every 6 months for osteoporosis -answered all of patient's questions in detail  FOLLOW UP: Return to clinic with Dr. Candise Che in 12 weeks Labs in 11 weeks PLz schedule next 2 doses of Prolia every 6 months  The total time  spent in the appointment was 30 minutes* .  All of the patient's questions were answered with apparent satisfaction. The patient knows to call the clinic with any problems, questions or concerns.   Wyvonnia Lora MD MS AAHIVMS [facility] Hematology/Oncology Physician [facility]  .*Total Encounter Time as defined by the Centers for Medicare and Medicaid Services includes, in addition to the face-to-face time of a patient visit (documented in the note above) non-face-to-face time: obtaining and reviewing outside history, ordering and reviewing medications, tests or procedures, care coordination (communications with other health care professionals or caregivers) and documentation in the medical record.    I,Mitra Faeizi,acting as a Neurosurgeon for Wyvonnia Lora, MD.,have documented all relevant documentation on the behalf of Wyvonnia Lora, MD,as directed by  Wyvonnia Lora, MD while in the presence of Wyvonnia Lora, MD.  .I have reviewed the above documentation for accuracy and completeness, and I agree with the above. Johney Maine MD

## 2023-08-13 ENCOUNTER — Encounter: Payer: Self-pay | Admitting: Hematology

## 2023-08-14 DIAGNOSIS — R269 Unspecified abnormalities of gait and mobility: Secondary | ICD-10-CM | POA: Diagnosis not present

## 2023-08-14 DIAGNOSIS — M79604 Pain in right leg: Secondary | ICD-10-CM | POA: Diagnosis not present

## 2023-08-14 DIAGNOSIS — M79605 Pain in left leg: Secondary | ICD-10-CM | POA: Diagnosis not present

## 2023-08-14 DIAGNOSIS — R531 Weakness: Secondary | ICD-10-CM | POA: Diagnosis not present

## 2023-08-22 ENCOUNTER — Other Ambulatory Visit: Payer: Self-pay

## 2023-08-22 DIAGNOSIS — C9001 Multiple myeloma in remission: Secondary | ICD-10-CM

## 2023-08-22 MED ORDER — LENALIDOMIDE 5 MG PO CAPS
ORAL_CAPSULE | ORAL | 0 refills | Status: AC
Start: 2023-08-22 — End: ?

## 2023-08-23 ENCOUNTER — Other Ambulatory Visit: Payer: Self-pay | Admitting: Hematology

## 2023-08-23 DIAGNOSIS — C9001 Multiple myeloma in remission: Secondary | ICD-10-CM

## 2023-08-27 ENCOUNTER — Encounter: Payer: Self-pay | Admitting: Hematology

## 2023-08-28 DIAGNOSIS — R269 Unspecified abnormalities of gait and mobility: Secondary | ICD-10-CM | POA: Diagnosis not present

## 2023-08-28 DIAGNOSIS — M79604 Pain in right leg: Secondary | ICD-10-CM | POA: Diagnosis not present

## 2023-08-28 DIAGNOSIS — M79605 Pain in left leg: Secondary | ICD-10-CM | POA: Diagnosis not present

## 2023-08-28 DIAGNOSIS — R531 Weakness: Secondary | ICD-10-CM | POA: Diagnosis not present

## 2023-09-02 DIAGNOSIS — R531 Weakness: Secondary | ICD-10-CM | POA: Diagnosis not present

## 2023-09-02 DIAGNOSIS — M79604 Pain in right leg: Secondary | ICD-10-CM | POA: Diagnosis not present

## 2023-09-02 DIAGNOSIS — M79605 Pain in left leg: Secondary | ICD-10-CM | POA: Diagnosis not present

## 2023-09-02 DIAGNOSIS — R269 Unspecified abnormalities of gait and mobility: Secondary | ICD-10-CM | POA: Diagnosis not present

## 2023-09-09 DIAGNOSIS — M79604 Pain in right leg: Secondary | ICD-10-CM | POA: Diagnosis not present

## 2023-09-09 DIAGNOSIS — R531 Weakness: Secondary | ICD-10-CM | POA: Diagnosis not present

## 2023-09-09 DIAGNOSIS — M79605 Pain in left leg: Secondary | ICD-10-CM | POA: Diagnosis not present

## 2023-09-09 DIAGNOSIS — R269 Unspecified abnormalities of gait and mobility: Secondary | ICD-10-CM | POA: Diagnosis not present

## 2023-09-19 ENCOUNTER — Other Ambulatory Visit: Payer: Self-pay

## 2023-09-19 DIAGNOSIS — C9001 Multiple myeloma in remission: Secondary | ICD-10-CM

## 2023-09-19 MED ORDER — LENALIDOMIDE 5 MG PO CAPS
ORAL_CAPSULE | ORAL | 0 refills | Status: DC
Start: 2023-09-19 — End: 2023-10-20

## 2023-09-20 ENCOUNTER — Other Ambulatory Visit: Payer: Self-pay | Admitting: Hematology

## 2023-09-20 DIAGNOSIS — C9001 Multiple myeloma in remission: Secondary | ICD-10-CM

## 2023-09-23 DIAGNOSIS — R531 Weakness: Secondary | ICD-10-CM | POA: Diagnosis not present

## 2023-09-23 DIAGNOSIS — R269 Unspecified abnormalities of gait and mobility: Secondary | ICD-10-CM | POA: Diagnosis not present

## 2023-09-23 DIAGNOSIS — M79605 Pain in left leg: Secondary | ICD-10-CM | POA: Diagnosis not present

## 2023-09-23 DIAGNOSIS — M79604 Pain in right leg: Secondary | ICD-10-CM | POA: Diagnosis not present

## 2023-10-07 DIAGNOSIS — R531 Weakness: Secondary | ICD-10-CM | POA: Diagnosis not present

## 2023-10-07 DIAGNOSIS — R269 Unspecified abnormalities of gait and mobility: Secondary | ICD-10-CM | POA: Diagnosis not present

## 2023-10-07 DIAGNOSIS — M79605 Pain in left leg: Secondary | ICD-10-CM | POA: Diagnosis not present

## 2023-10-07 DIAGNOSIS — M79604 Pain in right leg: Secondary | ICD-10-CM | POA: Diagnosis not present

## 2023-10-10 ENCOUNTER — Emergency Department (HOSPITAL_COMMUNITY): Payer: Medicare Other

## 2023-10-10 ENCOUNTER — Emergency Department (HOSPITAL_COMMUNITY)
Admission: EM | Admit: 2023-10-10 | Discharge: 2023-10-10 | Disposition: A | Discharge status: Home / Self Care | Payer: Medicare Other | Attending: Emergency Medicine | Admitting: Emergency Medicine

## 2023-10-10 DIAGNOSIS — I10 Essential (primary) hypertension: Secondary | ICD-10-CM | POA: Diagnosis not present

## 2023-10-10 DIAGNOSIS — R531 Weakness: Secondary | ICD-10-CM | POA: Diagnosis not present

## 2023-10-10 DIAGNOSIS — Z7984 Long term (current) use of oral hypoglycemic drugs: Secondary | ICD-10-CM | POA: Diagnosis not present

## 2023-10-10 DIAGNOSIS — Z7901 Long term (current) use of anticoagulants: Secondary | ICD-10-CM | POA: Diagnosis not present

## 2023-10-10 DIAGNOSIS — Z79899 Other long term (current) drug therapy: Secondary | ICD-10-CM | POA: Insufficient documentation

## 2023-10-10 DIAGNOSIS — Z85828 Personal history of other malignant neoplasm of skin: Secondary | ICD-10-CM | POA: Diagnosis not present

## 2023-10-10 DIAGNOSIS — E119 Type 2 diabetes mellitus without complications: Secondary | ICD-10-CM | POA: Diagnosis not present

## 2023-10-10 DIAGNOSIS — R9389 Abnormal findings on diagnostic imaging of other specified body structures: Secondary | ICD-10-CM | POA: Diagnosis not present

## 2023-10-10 DIAGNOSIS — I4891 Unspecified atrial fibrillation: Secondary | ICD-10-CM | POA: Diagnosis not present

## 2023-10-10 DIAGNOSIS — Z8673 Personal history of transient ischemic attack (TIA), and cerebral infarction without residual deficits: Secondary | ICD-10-CM | POA: Insufficient documentation

## 2023-10-10 DIAGNOSIS — R29818 Other symptoms and signs involving the nervous system: Secondary | ICD-10-CM | POA: Diagnosis not present

## 2023-10-10 DIAGNOSIS — R001 Bradycardia, unspecified: Secondary | ICD-10-CM | POA: Diagnosis not present

## 2023-10-10 LAB — CBC
HCT: 42.8 % (ref 39.0–52.0)
Hemoglobin: 14.3 g/dL (ref 13.0–17.0)
MCH: 30 pg (ref 26.0–34.0)
MCHC: 33.4 g/dL (ref 30.0–36.0)
MCV: 89.9 fL (ref 80.0–100.0)
Platelets: 115 10*3/uL — ABNORMAL LOW (ref 150–400)
RBC: 4.76 MIL/uL (ref 4.22–5.81)
RDW: 14.4 % (ref 11.5–15.5)
WBC: 2.7 10*3/uL — ABNORMAL LOW (ref 4.0–10.5)
nRBC: 0 % (ref 0.0–0.2)

## 2023-10-10 LAB — COMPREHENSIVE METABOLIC PANEL
ALT: 12 U/L (ref 0–44)
AST: 23 U/L (ref 15–41)
Albumin: 3.9 g/dL (ref 3.5–5.0)
Alkaline Phosphatase: 49 U/L (ref 38–126)
Anion gap: 9 (ref 5–15)
BUN: 16 mg/dL (ref 8–23)
CO2: 24 mmol/L (ref 22–32)
Calcium: 9 mg/dL (ref 8.9–10.3)
Chloride: 107 mmol/L (ref 98–111)
Creatinine, Ser: 1.17 mg/dL (ref 0.61–1.24)
GFR, Estimated: >60 mL/min (ref >60)
Glucose, Bld: 111 mg/dL — ABNORMAL HIGH (ref 70–99)
Potassium: 4.1 mmol/L (ref 3.5–5.1)
Sodium: 140 mmol/L (ref 135–145)
Total Bilirubin: 0.8 mg/dL (ref 0.3–1.2)
Total Protein: 6.7 g/dL (ref 6.5–8.1)

## 2023-10-10 NOTE — Discharge Instructions (Signed)
Your leg appeared weaker but is improving.  However your MRI did not show stroke.  Follow-up with your neurologist.

## 2023-10-10 NOTE — ED Notes (Signed)
Trop [id number], Picking MD made aware

## 2023-10-10 NOTE — ED Provider Notes (Signed)
[place] EMERGENCY DEPARTMENT AT [facility] Provider Note   CSN: [id number] Arrival date & time: 10/10/23  1544     History  Chief Complaint  Patient presents with   Weakness   Fall    Justin [name] is a 82 y.o. male.   Weakness Fall  Patient presents after fall.  Woke up this morning feeling weak on his left side.  Has some mild chronic left-sided weakness from previous strokes.  Reportedly had what sounds of a PFO and atrial fibrillation.  Is on anticoagulation.  Stroke was years ago however.  Does go to physical therapy for left-sided weakness but states that people would not normally be able to tell that he is weak on that side.  Now has trouble raising the leg.  States that his vision has been blurred bilaterally for about a week.  No headache.    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/30/2018   Gait abnormality    GERD (gastroesophageal reflux disease)    Gout    History of loop recorder    History of squamous cell carcinoma    Hyperlipidemia 12/13/2014   IPMN (intraductal papillary mucinous neoplasm) 02/20/2022   Left leg weakness    Low back pain 01/30/2018   Lumbar radiculopathy 01/30/2018   Mild cognitive impairment of uncertain or unknown etiology 07/16/2023   Multiple lacunar infarcts 2017   Bilateral basal ganglia; right frontal white matter     Multiple myeloma without remission 03/10/2019   Numbness 06/01/2016   Osteoporosis 03/10/2019   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 Authorizing Provider  B Complex-C (SUPER B-COMPLEX + VITAMIN C PO) Take 1 capsule by mouth daily with supper. 01/08/23  Yes [provider]  cyanocobalamin 1000 MCG tablet Take 1,000 mcg by mouth daily.   Yes [provider]  denosumab (PROLIA) 60 MG/ML SOSY injection Inject 60 mg into the skin every 6 (six) months.   Yes [provider]  lenalidomide (REVLIMID) 5 MG capsule TAKE 1 CAPSULE DAILY FOR 21 DAYS ON THEN 7 DAYS OFF. REPEAT EVERY 28 DAYS 09/19/23  Yes Johney Maine, MD  Magnesium 400 MG CAPS Take 400 mg by mouth daily.   Yes [provider]  memantine (NAMENDA) 5 MG tablet Take 1 tablet (5 mg total) by mouth at bedtime. 07/29/23  Yes Marcos Eke, PA-C  metFORMIN (GLUCOPHAGE) 1000 MG tablet TAKE ONE-HALF (1/2) TABLET DAILY WITH BREAKFAST 11/06/22  Yes Hilty, Lisette Abu, MD  nitroGLYCERIN (NITROSTAT) 0.4 MG SL tablet Place 1 tablet (0.4 mg total) under the tongue every 5 (five) minutes as needed for chest pain (Max 3 doses in 15 mins.). 09/28/18  Yes Hongalgi, Maximino Greenland, MD  omeprazole (PRILOSEC) 20 MG capsule Take 20 mg by mouth every morning.    Yes [provider]  Potassium Gluconate 80 MG TABS as directed Orally 01/08/23  Yes [provider]  pravastatin (PRAVACHOL) 10 MG tablet Take 1 tablet (10 mg total) by mouth daily. 04/30/23  Yes Hilty, Lisette Abu, MD  pregabalin (LYRICA) 75 MG capsule Take 75 mg by mouth 2 (two) times daily. 12/22/19  Yes [provider]  rivaroxaban (XARELTO) 20 MG TABS tablet TAKE 1 TABLET DAILY WITH SUPPER 02/03/23  Yes Hilty, Lisette Abu, MD  Turmeric (QC TUMERIC COMPLEX PO) Take 1 capsule by mouth daily.   Yes [provider]  Vitamin D, Ergocalciferol, (DRISDOL) 1.25 MG  (50000 UNIT) CAPS capsule TAKE 1 CAPSULE BY MOUTH 1 TIME A WEEK Patient taking differently: 50,000 Units every 7 (seven) days. Takes on Weds 07/01/23  Yes Johney Maine, MD  FREESTYLE LITE test strip  07/21/20   [provider]  glucose monitoring kit (FREESTYLE) monitoring kit 1 each by Does not apply route as needed for other.    [provider]  Lancets (FREESTYLE) lancets 1 each daily. 07/21/20   [provider]      Allergies    Other and No known allergies    Review of Systems   Review of Systems  Neurological:  Positive for weakness.    Physical Exam Updated Vital Signs BP (!) 142/70   Pulse (!) 51   Temp 97.7 F (36.5 C) (Oral)   Resp 14   SpO2 100%  Physical Exam Vitals and nursing note reviewed.  HENT:     Head: Normocephalic.  Eyes:     Comments: Visual fields grossly intact by confrontation.  Cardiovascular:     Rate and Rhythm: Regular rhythm.  Pulmonary:     Effort: Pulmonary effort is normal.  Musculoskeletal:     Cervical back: Neck supple.  Neurological:     Mental Status: He is alert and oriented to person, place, and time.     Comments: Decreesed strength in left lower extremity compared to right.  Still can raise left leg but not nearly as much as the right side.  States normally they are much more equal.  Good grip strength bilaterally on upper extremities.  Visual fields intact.  Face symmetric.  No neglect.     ED Results / Procedures / Treatments   Labs (all labs ordered are listed, but only abnormal results are displayed) Labs Reviewed  COMPREHENSIVE METABOLIC PANEL - Abnormal; Notable for the following components:      Result Value   Glucose, Bld 111 (*)    All other components within normal limits  CBC - Abnormal; Notable for the following components:   WBC 2.7 (*)    Platelets 115 (*)    All other components within normal limits    EKG None  Radiology MR BRAIN WO CONTRAST  Result Date:  10/10/2023 CLINICAL DATA:  Neuro deficit, acute, stroke suspected EXAM: MRI HEAD WITHOUT CONTRAST TECHNIQUE: Multiplanar, multiecho pulse sequences of the brain and surrounding structures were obtained without intravenous contrast. COMPARISON:  MRI head 09/16/2022. FINDINGS: Brain: No acute infarction, hemorrhage, hydrocephalus, extra-axial collection or mass lesion. Patchy T2/FLAIR hyperintensities the white matter are compatible with chronic microvascular ischemic change. Small remote lacunar infarct in the right frontal white matter. No pathologic enhancement. Vascular: Major arterial flow voids are maintained at the skull base. Skull and upper cervical spine: Normal marrow signal. Sinuses/Orbits: Clear sinuses.  No acute orbital findings. Other: No mastoid effusions. IMPRESSION: No evidence of acute intracranial abnormality. Electronically Signed  By: Feliberto Harts M.D.   On: 10/10/2023 19:49   DG Chest Portable 1 View  Result Date: 10/10/2023 CLINICAL DATA:  Weakness.  Fall. EXAM: PORTABLE CHEST 1 VIEW COMPARISON:  09/27/2018 FINDINGS: The cardiomediastinal contours are normal. Mild chronic elevation of right hemidiaphragm. Pulmonary vasculature is normal. No consolidation, pleural effusion, or pneumothorax. No acute osseous abnormalities are seen. IMPRESSION: No active disease. Electronically Signed   By: Narda Rutherford M.D.   On: 10/10/2023 17:51    Procedures Procedures    Medications Ordered in ED Medications - No data to display  ED Course/ Medical Decision Making/ A&P                                 Medical Decision Making Amount and/or Complexity of Data Reviewed Labs: ordered. Radiology: ordered.   Patient with fall.  Was down lower to the ground and fell to the left.  Likely due to weakness in the left side.  Not a TNK candidate due to being on anticoagulation and timing of symptoms.  Last normal was last night at around 12:30 PM.  Not VAN positive.  Blood work  reassuring.  MRI done after curbside discussion with neurology.  MRI negative.  Already on anticoagulation.  Appears stable for discharge home with follow-up with neurology.        Final Clinical Impression(s) / ED Diagnoses Final diagnoses:  Weakness    Rx / DC Orders ED Discharge Orders     None         Benjiman Core, MD 10/10/23 2300

## 2023-10-10 NOTE — ED Triage Notes (Signed)
Patient arrives POV from home for fall today and is on thinners. Patient did not hit head and denies LOC. Patient endorses increased weakness to left side starting this morning at 0930, LKW was 1230 last night. Patient has hx of stroke and left sided deficits but this is worse than usual. A&Ox4, ambulatory, VSS at this time.

## 2023-10-12 DIAGNOSIS — R531 Weakness: Secondary | ICD-10-CM | POA: Diagnosis not present

## 2023-10-14 DIAGNOSIS — Z86018 Personal history of other benign neoplasm: Secondary | ICD-10-CM | POA: Diagnosis not present

## 2023-10-14 DIAGNOSIS — D485 Neoplasm of uncertain behavior of skin: Secondary | ICD-10-CM | POA: Diagnosis not present

## 2023-10-14 DIAGNOSIS — C4442 Squamous cell carcinoma of skin of scalp and neck: Secondary | ICD-10-CM | POA: Diagnosis not present

## 2023-10-14 DIAGNOSIS — D225 Melanocytic nevi of trunk: Secondary | ICD-10-CM | POA: Diagnosis not present

## 2023-10-14 DIAGNOSIS — L570 Actinic keratosis: Secondary | ICD-10-CM | POA: Diagnosis not present

## 2023-10-14 DIAGNOSIS — L814 Other melanin hyperpigmentation: Secondary | ICD-10-CM | POA: Diagnosis not present

## 2023-10-14 DIAGNOSIS — D044 Carcinoma in situ of skin of scalp and neck: Secondary | ICD-10-CM | POA: Diagnosis not present

## 2023-10-14 DIAGNOSIS — Z85828 Personal history of other malignant neoplasm of skin: Secondary | ICD-10-CM | POA: Diagnosis not present

## 2023-10-14 DIAGNOSIS — L578 Other skin changes due to chronic exposure to nonionizing radiation: Secondary | ICD-10-CM | POA: Diagnosis not present

## 2023-10-14 DIAGNOSIS — L821 Other seborrheic keratosis: Secondary | ICD-10-CM | POA: Diagnosis not present

## 2023-10-15 ENCOUNTER — Ambulatory Visit: Payer: Medicare Other | Admitting: Physician Assistant

## 2023-10-15 NOTE — Progress Notes (Deleted)
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 Patient is currently on memantine 5 mg bid.  .     Follow up in   months. Repeat Neuropsych testing in 1-2 years  for clarity of diagnosis and disease trajectory   Continue Memantine 5 mg twice daily. Side effects were discussed (history of bradycardia) Recommend good control of her cardiovascular risk factors.  Continue Xarelto 20 mg daily. Continue B12 supplements  Continue to control mood as per PCP  Recent left-sided weakness, unclear etiology.  Patient experienced left leg weakness with subsequent fall without LOC, requiring workup at the ED, which was negative.  MRI was without acute findings.  Etiology is unclear.  Patient is already on Xarelto 20 mg daily. Check MRA of the head and neck for better visualization of the vessels Continue Xarelto 20 mg daily. Follow-up with cardiology.   Subjective:    This patient is accompanied in the office by *** who supplements the history.  Previous records as well as any outside records available were reviewed prior to todays visit. Patient was last seen on 07/29/23 ***   Any changes in memory since last visit? ". Patient has some difficulty remembering recent conversations and people names. He does not like brain games.  He still denies to fly as a passenger on a friend's plane (he cannot fly the plane by himself anymore) repeats oneself?  Endorsed Disoriented when walking into a room?  Patient denies ***  Leaving objects in unusual places?  May misplace things but not in unusual places***   Wandering behavior?  denies   Any personality changes since last visit?  denies   Any worsening depression?:  Denies.   Hallucinations or paranoia?  Denies.   Seizures? denies    Any sleep changes?  Denies vivid dreams, REM behavior or sleepwalking   Sleep apnea?   Denies.   Any hygiene concerns? Denies.  Independent of bathing and dressing?  Endorsed  Does the patient needs help with medications?   is in charge *** Who is in charge of the finances?   is in charge   *** Any changes in appetite?  denies ***   Patient have trouble swallowing? Denies.   Does the patient cook? No Any headaches?   denies   Chronic back pain  denies   Ambulates with difficulty?  He has a cane but he does not like to use it*** Recent falls or head injuries?  Endorsed, this required presentation to the emergency department.  (See below) Unilateral weakness, numbness or tingling?  Patient has a history of prior stroke with residual mild left-sided weakness, but on 10/10/2023, he presented to the ED with difficulty raising the left leg, with subsequent fall with 1 week intermittent blurry vision bilaterally, but without headaches.  Because being out of the window, he did not receive TNK.  MRI of the brain was negative for acute findings.  He is already on Xarelto 20 mg daily.  He was discharged to home in stable condition. Any tremors?  Denies   Any anosmia?  Denies   Any incontinence of urine?  He  has nocturia and wears a pad "just in case " Any bowel dysfunction?   Denies      Patient lives with wife and stepson*** Does the patient drive?  Yes, denies getting lost.***   Personally reviewed the MRI of the brain in September 2023 without acute intracranial abnormalities, remarkable for chronic microvascular ischemic disease without white matter without pathologic enhancement, and essentially normal volume for age.  Personally reviewed MRI of the brain 10/10/2023 without acute intracranial findings, chronic  microvascular ischemic changes, small remote lacunar infarct in the right frontal white matter, no pathologic enhancement.   Initial visit 10/11/20203 How long did patient have memory difficulties?  For the last year, worse over the 2 months have more difficulty with names of known people including daughter and grand-daughter, missing appointments, and sometimes he forgets in the middle of the conversation when he was talking about.  He reports that he is STM is worse than LTM. repeats oneself? Endorsed  Disoriented when walking into a room?  Patient denies   Leaving objects in unusual places?  Patient denies   Patient lives with wife and her son  Ambulates  with difficulty?   Feels a little unsteady due to a history of peripheral neuropathy due to diabetes, not recently fell on the "rear end" without LOC or head injury. Set to participate in PT/OT.  Otherwise, he walks daily and works on the yard.  History of seizures?   Patient denies   Wandering behavior?  Patient denies   Patient drives?   No issues  Any mood changes ?  "Sometimes I get upset with myself because I can't remember something and can be frustrating ".  Any depression?:  Patient denies   Hallucinations?  Patient denies   Paranoia?  Patient denies   Patient reports that sleeps well without vivid dreams, REM behavior or sleepwalking .   History of sleep apnea?  Patient denies   Any hygiene concerns?  Patient denies   Independent of bathing and dressing?  Endorsed  Does the patient needs help with medications?   Patient in charge  Who is in charge of the finances? Patient  is in charge   Any changes in appetite?  Patient denies   Patient have trouble swallowing? Patient denies   Does the patient cook?  Patient denies   Any kitchen accidents such as leaving the stove on? Patient denies   Any headaches?  Patient denies   Double vision? Patient denies   Any focal numbness or tingling?  Has bilateral diabetic neuropathy and  spinal stenosis, he is on Lyrica tolerating well.  Chronic back pain?  Patient denies   Unilateral weakness?  Patient denies   Any tremors?  Patient denies   Any history of anosmia?  Patient denies   Any incontinence of urine?  Wears pads "just in case because I may dribble" Any bowel dysfunction?   Intermittent diarrhea or constipation due to Revlimid  History of heavy alcohol intake?  Patient denies   History of heavy tobacco use?  Patient denies   Family history of dementia?   Sister died with LBD  PREVIOUS MEDICATIONS:   CURRENT MEDICATIONS:  Outpatient Encounter Medications as of 10/15/2023  Medication Sig   B Complex-C (SUPER B-COMPLEX + VITAMIN C PO) Take 1 capsule by mouth daily with supper.   cyanocobalamin 1000 MCG tablet Take 1,000 mcg by mouth daily.   denosumab (PROLIA) 60 MG/ML SOSY injection Inject 60 mg into the skin every 6 (six) months.  FREESTYLE LITE test strip    glucose monitoring kit (FREESTYLE) monitoring kit 1 each by Does not apply route as needed for other.   Lancets (FREESTYLE) lancets 1 each daily.   lenalidomide (REVLIMID) 5 MG capsule TAKE 1 CAPSULE DAILY FOR 21 DAYS ON THEN 7 DAYS OFF. REPEAT EVERY 28 DAYS   Magnesium 400 MG CAPS Take 400 mg by mouth daily.   memantine (NAMENDA) 5 MG tablet Take 1 tablet (5 mg total) by mouth at bedtime.   metFORMIN (GLUCOPHAGE) 1000 MG tablet TAKE ONE-HALF (1/2) TABLET DAILY WITH BREAKFAST   nitroGLYCERIN (NITROSTAT) 0.4 MG SL tablet Place 1 tablet (0.4 mg total) under the tongue every 5 (five) minutes as needed for chest pain (Max 3 doses in 15 mins.).   omeprazole (PRILOSEC) 20 MG capsule Take 20 mg by mouth every morning.    Potassium Gluconate 80 MG TABS as directed Orally   pravastatin (PRAVACHOL) 10 MG tablet Take 1 tablet (10 mg total) by mouth daily.   pregabalin (LYRICA) 75 MG capsule Take 75 mg by mouth 2 (two) times daily.   rivaroxaban (XARELTO) 20 MG TABS tablet TAKE 1 TABLET DAILY WITH SUPPER   Turmeric  (QC TUMERIC COMPLEX PO) Take 1 capsule by mouth daily.   Vitamin D, Ergocalciferol, (DRISDOL) 1.25 MG (50000 UNIT) CAPS capsule TAKE 1 CAPSULE BY MOUTH 1 TIME A WEEK (Patient taking differently: 50,000 Units every 7 (seven) days. Takes on Weds)   No facility-administered encounter medications on file as of 10/15/2023.        No data to display            10/03/2022    7:00 AM  Montreal Cognitive Assessment   Visuospatial/ Executive (0/5) 5  Naming (0/3) 3  Attention: Read list of digits (0/2) 2  Attention: Read list of letters (0/1) 1  Attention: Serial 7 subtraction starting at 100 (0/3) 3  Language: Repeat phrase (0/2) 2  Language : Fluency (0/1) 1  Abstraction (0/2) 2  Delayed Recall (0/5) 2  Orientation (0/6) 5  Total 26  Adjusted Score (based on education) 26    Objective:     PHYSICAL EXAMINATION:    VITALS:  There were no vitals filed for this visit.  GEN:  The patient appears stated age and is in NAD. HEENT:  Normocephalic, atraumatic.   Neurological examination:  General: NAD, well-groomed, appears stated age. Orientation: The patient is alert. Oriented to person, place and date Cranial nerves: There is good facial symmetry.The speech is fluent and clear. No aphasia or dysarthria. Fund of knowledge is appropriate. Recent and remote memory are impaired. Attention and concentration are reduced.  Able to name objects and repeat phrases.  Hearing is intact to conversational tone. *** Sensation: Sensation is intact to light touch throughout Motor: Strength is at least antigravity x4. DTR's 2/4 in UE/LE     Movement examination: Tone: There is normal tone in the UE/LE Abnormal movements:  no tremor.  No myoclonus.  No asterixis.   Coordination:  There is no decremation with RAM's. Normal finger to nose  Gait and Station: The patient has no*** difficulty arising out of a deep-seated chair without the use of the hands. The patient's stride length is good.  Gait  is cautious and narrow.    Thank you for allowing [place] the opportunity to participate in the care of this nice patient. Please do not hesitate to contact us for any questions or concerns.   Total time spent on  today's visit was *** minutes dedicated to this patient today, preparing to see patient, examining the patient, ordering tests and/or medications and counseling the patient, documenting clinical information in the EHR or other health record, independently interpreting results and communicating results to the patient/family, discussing treatment and goals, answering patient's questions and coordinating care.  Cc:  Lupita Raider, MD  Marlowe Kays 10/15/2023 6:31 AM

## 2023-10-18 ENCOUNTER — Other Ambulatory Visit: Payer: Self-pay | Admitting: Hematology

## 2023-10-18 DIAGNOSIS — C9001 Multiple myeloma in remission: Secondary | ICD-10-CM

## 2023-10-20 ENCOUNTER — Other Ambulatory Visit: Payer: Self-pay

## 2023-10-20 ENCOUNTER — Encounter: Payer: Self-pay | Admitting: Hematology

## 2023-10-20 ENCOUNTER — Inpatient Hospital Stay: Payer: Medicare Other | Attending: Hematology

## 2023-10-20 DIAGNOSIS — C9001 Multiple myeloma in remission: Secondary | ICD-10-CM

## 2023-10-20 DIAGNOSIS — C9000 Multiple myeloma not having achieved remission: Secondary | ICD-10-CM | POA: Diagnosis not present

## 2023-10-20 LAB — CBC WITH DIFFERENTIAL (CANCER CENTER ONLY)
Abs Immature Granulocytes: 0 10*3/uL (ref 0.00–0.07)
Basophils Absolute: 0.1 10*3/uL (ref 0.0–0.1)
Basophils Relative: 2 %
Eosinophils Absolute: 0.1 10*3/uL (ref 0.0–0.5)
Eosinophils Relative: 4 %
HCT: 39.3 % (ref 39.0–52.0)
Hemoglobin: 13.1 g/dL (ref 13.0–17.0)
Immature Granulocytes: 0 %
Lymphocytes Relative: 31 %
Lymphs Abs: 0.9 10*3/uL (ref 0.7–4.0)
MCH: 30.3 pg (ref 26.0–34.0)
MCHC: 33.3 g/dL (ref 30.0–36.0)
MCV: 91 fL (ref 80.0–100.0)
Monocytes Absolute: 0.4 10*3/uL (ref 0.1–1.0)
Monocytes Relative: 14 %
Neutro Abs: 1.4 10*3/uL — ABNORMAL LOW (ref 1.7–7.7)
Neutrophils Relative %: 49 %
Platelet Count: 92 10*3/uL — ABNORMAL LOW (ref 150–400)
RBC: 4.32 MIL/uL (ref 4.22–5.81)
RDW: 14.1 % (ref 11.5–15.5)
WBC Count: 2.8 10*3/uL — ABNORMAL LOW (ref 4.0–10.5)
nRBC: 0 % (ref 0.0–0.2)

## 2023-10-20 LAB — CMP (CANCER CENTER ONLY)
ALT: 9 U/L (ref 0–44)
AST: 20 U/L (ref 15–41)
Albumin: 3.8 g/dL (ref 3.5–5.0)
Alkaline Phosphatase: 43 U/L (ref 38–126)
Anion gap: 7 (ref 5–15)
BUN: 23 mg/dL (ref 8–23)
CO2: 24 mmol/L (ref 22–32)
Calcium: 8.6 mg/dL — ABNORMAL LOW (ref 8.9–10.3)
Chloride: 105 mmol/L (ref 98–111)
Creatinine: 1.23 mg/dL (ref 0.61–1.24)
GFR, Estimated: 58 mL/min — ABNORMAL LOW (ref >60)
Glucose, Bld: 129 mg/dL — ABNORMAL HIGH (ref 70–99)
Potassium: 4.3 mmol/L (ref 3.5–5.1)
Sodium: 136 mmol/L (ref 135–145)
Total Bilirubin: 0.9 mg/dL (ref 0.3–1.2)
Total Protein: 6.3 g/dL — ABNORMAL LOW (ref 6.5–8.1)

## 2023-10-20 MED ORDER — LENALIDOMIDE 5 MG PO CAPS: ORAL_CAPSULE | ORAL | 0 refills | Status: DC

## 2023-10-21 DIAGNOSIS — R269 Unspecified abnormalities of gait and mobility: Secondary | ICD-10-CM | POA: Diagnosis not present

## 2023-10-21 DIAGNOSIS — M79604 Pain in right leg: Secondary | ICD-10-CM | POA: Diagnosis not present

## 2023-10-21 DIAGNOSIS — R531 Weakness: Secondary | ICD-10-CM | POA: Diagnosis not present

## 2023-10-21 DIAGNOSIS — M79605 Pain in left leg: Secondary | ICD-10-CM | POA: Diagnosis not present

## 2023-10-21 LAB — KAPPA/LAMBDA LIGHT CHAINS
Kappa free light chain: 37.2 mg/L — ABNORMAL HIGH (ref 3.3–19.4)
Kappa, lambda light chain ratio: 1.24 (ref 0.26–1.65)
Lambda free light chains: 30 mg/L — ABNORMAL HIGH (ref 5.7–26.3)

## 2023-10-23 LAB — MULTIPLE MYELOMA PANEL, SERUM
Albumin SerPl Elph-Mcnc: 3.5 g/dL (ref 2.9–4.4)
Albumin/Glob SerPl: 1.5 (ref 0.7–1.7)
Alpha 1: 0.2 g/dL (ref 0.0–0.4)
Alpha2 Glob SerPl Elph-Mcnc: 0.6 g/dL (ref 0.4–1.0)
B-Globulin SerPl Elph-Mcnc: 0.8 g/dL (ref 0.7–1.3)
Gamma Glob SerPl Elph-Mcnc: 0.8 g/dL (ref 0.4–1.8)
Globulin, Total: 2.4 g/dL (ref 2.2–3.9)
IgA: 101 mg/dL (ref 61–437)
IgG (Immunoglobin G), Serum: 984 mg/dL (ref 603–1613)
IgM (Immunoglobulin M), Srm: 17 mg/dL (ref 15–143)
M Protein SerPl Elph-Mcnc: 0.2 g/dL — ABNORMAL HIGH
Total Protein ELP: 5.9 g/dL — ABNORMAL LOW (ref 6.0–8.5)

## 2023-10-26 NOTE — Progress Notes (Signed)
Assessment/Plan:     Justin [name] is a very pleasant 82 y.o. year old RH male with a history of hypertension, hyperlipidemia, multiple myeloma, history of ADL, DM2, DJD, GERD, gout, B12 deficiency, history of CVA 2017 with bilateral basal ganglia and right frontal white matter lacunar infarcts, history of thrombocytopenia history of parotid mass, and a history of MCI of unclear etiology per neuropsych evaluation on 06/2023  seen today in follow-up for memory loss.  Latest MRI of the brain 10/10/2023 without acute intracranial abnormalities, same old strokes visualized chronic microvascular ischemic changes in the white matter.     Mild Cognitive Impairment of uncertain etiology  Repeat neuropsych evaluation on 8-20 months  Recommend B12 supplementation Recommend good control of cardiovascular risk factors.   Continue to control mood as per PCP Monitor driving*** [place] up in   Subjective:      The patient is accompanied by ***  who supplements the history.  He was last seen at our office on January 02, 2023.He was last seen on 06/2023     Any changes in memory since last visit? repeats oneself?  Endorsed Disoriented when walking into a room?  Patient denies except occasionally not remembering what patient came to the room for ***  Leaving objects in unusual places?  Patient denies   Wandering behavior?   denies   Any personality changes since last visit?   denies   Any worsening depression?: denies   Hallucinations or paranoia?  denies   Seizures?   denies    Any sleep changes?  Denies  vivid dreams, REM behavior or sleepwalking   Sleep apnea?   denies   Any hygiene concerns?   denies   Independent of bathing and dressing?  Endorsed  Does the patient needs help with medications? is in charge *** Who is in charge of the finances?   is in charge   *** Any changes in appetite?  denies ***   Patient have trouble swallowing?  denies   Does the patient cook?  Any kitchen  accidents such as leaving the stove on?   denies   Any headaches?    denies   Vision changes? denies Chronic back pain  denies   Ambulates with difficulty?    denies   Recent falls or head injuries?    denies     Unilateral weakness, numbness or tingling?   denies   Any tremors?  denies   Any anosmia?    denies   Any incontinence of urine?  denies   Any bowel dysfunction?  denies      Patient lives  *** Does the patient drive?***     Initial visit 10/11/20203 How long did patient have memory difficulties?  For the last year, worse over the 2 months have more difficulty with names of known people including daughter and grand-daughter, missing appointments, and sometimes he forgets in the middle of the conversation when he was talking about.  He reports that he is STM is worse than LTM. repeats oneself? Endorsed  Disoriented when walking into a room?  Patient denies   Leaving objects in unusual places?  Patient denies   Patient lives with wife and her son  Ambulates  with difficulty?   Feels a little unsteady due to a history of peripheral neuropathy due to diabetes, not recently fell on the "rear end" without LOC or head injury. Set to participate in PT/OT.  Otherwise, he walks daily and works on the yard.  History of seizures?   Patient denies   Wandering behavior?  Patient denies   Patient drives?   No issues  Any mood changes ?  "Sometimes I get upset with myself because I can't remember something and can be frustrating ".  Any depression?:  Patient denies   Hallucinations?  Patient denies   Paranoia?  Patient denies   Patient reports that sleeps well without vivid dreams, REM behavior or sleepwalking .   History of sleep apnea?  Patient denies   Any hygiene concerns?  Patient denies   Independent of bathing and dressing?  Endorsed  Does the patient needs help with medications?   Patient in charge  Who is in charge of the finances? Patient  is in charge   Any changes in appetite?   Patient denies   Patient have trouble swallowing? Patient denies   Does the patient cook?  Patient denies   Any kitchen accidents such as leaving the stove on? Patient denies   Any headaches?  Patient denies   Double vision? Patient denies   Any focal numbness or tingling?  Has bilateral diabetic neuropathy and spinal stenosis, he is on Lyrica tolerating well.  Chronic back pain?  Patient denies   Unilateral weakness?  Patient denies   Any tremors?  Patient denies   Any history of anosmia?  Patient denies   Any incontinence of urine?  Wears pads "just in case because I may dribble" Any bowel dysfunction?   Intermittent diarrhea or constipation due to Revlimid  History of heavy alcohol intake?  Patient denies   History of heavy tobacco use?  Patient denies   Family history of dementia?   Sister died with LBD  Allergies  Allergen Reactions   Other Other (See Comments)   No Known Allergies     Current Outpatient Medications  Medication Instructions   B Complex-C (SUPER B-COMPLEX + VITAMIN C PO) 1 capsule, Oral, Daily with supper   cyanocobalamin 1,000 mcg, Oral, Daily   denosumab (PROLIA) 60 mg, Subcutaneous, Every 6 months   FREESTYLE LITE test strip No dose, route, or frequency recorded.   glucose monitoring kit (FREESTYLE) monitoring kit 1 each, Does not apply, As needed   Lancets (FREESTYLE) lancets 1 each, Daily   lenalidomide (REVLIMID) 5 MG capsule TAKE 1 CAPSULE DAILY FOR 21 DAYS ON THEN 7 DAYS OFF AND REPEAT EVERY 28 DAYS   lenalidomide (REVLIMID) 5 MG capsule TAKE 1 CAPSULE DAILY FOR 21 DAYS ON, THEN 7 DAYS OFF. REPEAT EVERY 28 DAYS   Magnesium 400 mg, Oral, Daily   memantine (NAMENDA) 5 mg, Oral, Nightly   metFORMIN (GLUCOPHAGE) 1000 MG tablet TAKE ONE-HALF (1/2) TABLET DAILY WITH BREAKFAST   nitroGLYCERIN (NITROSTAT) 0.4 mg, Sublingual, Every 5 min PRN   omeprazole (PRILOSEC) 20 mg, Oral, BH-each morning   Potassium Gluconate 80 MG TABS as directed Orally    pravastatin (PRAVACHOL) 10 mg, Oral, Daily   pregabalin (LYRICA) 75 mg, Oral, 2 times daily   rivaroxaban (XARELTO) 20 MG TABS tablet TAKE 1 TABLET DAILY WITH SUPPER   Turmeric (QC TUMERIC COMPLEX PO) 1 capsule, Oral, Daily   Vitamin D, Ergocalciferol, (DRISDOL) 1.25 MG (50000 UNIT) CAPS capsule TAKE 1 CAPSULE BY MOUTH 1 TIME A WEEK     VITALS:  There were no vitals filed for this visit.    PHYSICAL EXAM   HEENT:  Normocephalic, atraumatic.  The superficial temporal arteries are without ropiness or tenderness. Cardiovascular: Regular rate and rhythm. Lungs: Clear to  auscultation bilaterally. Neck: There are no carotid bruits noted bilaterally.  NEUROLOGICAL:    10/03/2022    7:00 AM  Montreal Cognitive Assessment   Visuospatial/ Executive (0/5) 5  Naming (0/3) 3  Attention: Read list of digits (0/2) 2  Attention: Read list of letters (0/1) 1  Attention: Serial 7 subtraction starting at 100 (0/3) 3  Language: Repeat phrase (0/2) 2  Language : Fluency (0/1) 1  Abstraction (0/2) 2  Delayed Recall (0/5) 2  Orientation (0/6) 5  Total 26  Adjusted Score (based on education) 26        No data to display           Orientation:  Alert and oriented to person, place and not to time***. No aphasia or dysarthria. Fund of knowledge is appropriate. Recent and remote memory impaired.  Attention and concentration are reduced***.  Able to name objects and repeat phrases /5 ***. Delayed recall  / *** Cranial nerves: There is good facial symmetry. Extraocular muscles are intact and visual fields are full to confrontational testing. Speech is fluent and clear. No tongue deviation. Hearing is intact to conversational tone.*** Tone: Tone is good throughout. Sensation: Sensation is intact to light touch.  Vibration is intact at the bilateral big toe.  Coordination: The patient has no difficulty with RAM's or FNF bilaterally. Normal finger to nose  Motor:***Strength is 5/5 in the bilateral  upper and lower extremities. There is no pronator drift. There are no fasciculations noted. DTR's: Deep tendon reflexes are 2/4 bilaterally. Gait and Station: The patient is able to ambulate without difficulty. Gait is cautious and narrow. Stride length is normal ***      Thank you for allowing [place] the opportunity to participate in the care of this nice patient. Please do not hesitate to contact us for any questions or concerns.   Total time spent on today's visit was *** minutes dedicated to this patient today, preparing to see patient, examining the patient, ordering tests and/or medications and counseling the patient, documenting clinical information in the EHR or other health record, independently interpreting results and communicating results to the patient/family, discussing treatment and goals, answering patient's questions and coordinating care.  Cc:  Lupita Raider, MD  Marlowe Kays 10/26/2023 1:30 PM

## 2023-10-27 ENCOUNTER — Inpatient Hospital Stay: Payer: Medicare Other | Attending: Hematology | Admitting: Hematology

## 2023-10-27 VITALS — BP 131/76 | HR 54 | Resp 18 | Wt 190.8 lb

## 2023-10-27 DIAGNOSIS — D696 Thrombocytopenia, unspecified: Secondary | ICD-10-CM | POA: Insufficient documentation

## 2023-10-27 DIAGNOSIS — M810 Age-related osteoporosis without current pathological fracture: Secondary | ICD-10-CM | POA: Diagnosis not present

## 2023-10-27 DIAGNOSIS — Z85828 Personal history of other malignant neoplasm of skin: Secondary | ICD-10-CM | POA: Insufficient documentation

## 2023-10-27 DIAGNOSIS — Z7901 Long term (current) use of anticoagulants: Secondary | ICD-10-CM | POA: Insufficient documentation

## 2023-10-27 DIAGNOSIS — E538 Deficiency of other specified B group vitamins: Secondary | ICD-10-CM | POA: Insufficient documentation

## 2023-10-27 DIAGNOSIS — M5410 Radiculopathy, site unspecified: Secondary | ICD-10-CM | POA: Insufficient documentation

## 2023-10-27 DIAGNOSIS — D649 Anemia, unspecified: Secondary | ICD-10-CM | POA: Insufficient documentation

## 2023-10-27 DIAGNOSIS — I480 Paroxysmal atrial fibrillation: Secondary | ICD-10-CM | POA: Insufficient documentation

## 2023-10-27 DIAGNOSIS — Z8673 Personal history of transient ischemic attack (TIA), and cerebral infarction without residual deficits: Secondary | ICD-10-CM | POA: Diagnosis not present

## 2023-10-27 DIAGNOSIS — C9000 Multiple myeloma not having achieved remission: Secondary | ICD-10-CM | POA: Insufficient documentation

## 2023-10-27 NOTE — Progress Notes (Signed)
HEMATOLOGY/ONCOLOGY CLINIC NOTE  Date of Service: .10/27/2023  Patient Care Team: Lupita Raider, MD as PCP - General (Family Medicine) Rennis Golden Lisette Abu, MD as PCP - Cardiology (Cardiology)   CHIEF COMPLAINTS:  Follow-up for continued evaluation and management of multiple myeloma  HISTORY OF PRESENTING ILLNESS:  Please see previous note for details of initial presentation  CURRENT THERAPY:   Revlimid maintenance 5 mg po 3 weeks on 1 week off.   INTERVAL HISTORY:  Justin [name] is a 82 y.o. male who is here for continued evaluation and management of his multiple myeloma.  Patient was last seen by me on 08/06/2023 and he was doing well overall.   Patient notes he has been doing well overall since our last visit. He denies any new infection issues, fever, chills, night sweats, unexpected weight loss, chest pain, abdominal pain, or leg swelling. He does complain of chronic back pain.   Patient notes he had left leg pain and numbness causing him to fall since our last visit. He was admitted to the hospital and had an MRI, which did not show any abnormalities.   Patient notes he accidentally did an extra week of Revlimid. He has been tolerating his Revlimid well without any new or severe toxicities.   He regularly follows-up with his Dermatologist.  Patient notes he has been having memory issues since our last visit and he has been following up with his Neurologist.  MEDICAL HISTORY:  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/30/2018   Gait abnormality    GERD (gastroesophageal reflux disease)    Gout    History of loop  recorder    History of squamous cell carcinoma    Hyperlipidemia 12/13/2014   IPMN (intraductal papillary mucinous neoplasm) 02/20/2022   Left leg weakness    Low back pain 01/30/2018   Lumbar radiculopathy 01/30/2018   Mild cognitive impairment of uncertain or unknown etiology 07/16/2023   Multiple lacunar infarcts 2017   Bilateral basal ganglia; right frontal white matter     Multiple myeloma without remission 03/10/2019   Numbness 06/01/2016   Osteoporosis 03/10/2019   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 Ladona Ridgel   BASAL CELL CARCINOMA EXCISION     off of back   ELECTROPHYSIOLOGIC STUDY N/A 05/01/2015   Procedure: SVT Ablation;  Surgeon: Marinus Maw, MD;  Location: [facility] INVASIVE CV LAB;  Service: Cardiovascular;  Laterality: N/A;   EP IMPLANTABLE DEVICE N/A 06/04/2016   Procedure: Loop Recorder Insertion;  Surgeon: Hillis Range, MD;  Location: [facility] INVASIVE CV LAB;  Service: Cardiovascular;  Laterality: N/A;   implantable loop recorder removal     ILR removed by Dr Johney Frame   INGUINAL HERNIA REPAIR Bilateral 06/16/2017   Procedure: LAPAROSCOPIC BILATERAL INGUINAL HERNIA REPAIR;  Surgeon: Phylliss Blakes  A, MD;  Location: [facility] OR;  Service: General;  Laterality: Bilateral;   INSERTION OF MESH Bilateral 06/16/2017   Procedure: INSERTION OF MESH;  Surgeon: Berna Bue, MD;  Location: [facility] OR;  Service: General;  Laterality: Bilateral;   MASS EXCISION Right 07/28/2018   Procedure: EXCISION OF RIGHT FOREARM MASS;  Surgeon: Berna Bue, MD;  Location: [facility] ORS;  Service: General;  Laterality: Right;   SQUAMOUS CELL CARCINOMA EXCISION      TEE WITHOUT CARDIOVERSION N/A 06/04/2016   Procedure: TRANSESOPHAGEAL ECHOCARDIOGRAM (TEE);  Surgeon: Chrystie Nose, MD;  Location: [facility] ENDOSCOPY;  Service: Cardiovascular;  Laterality: N/A;    SOCIAL HISTORY: Social History   Socioeconomic History   Marital status: Married    Spouse name: Not on file   Number of children: 3   Years of education: 17   Highest education level: Bachelor's degree (e.g., BA, AB, BS)  Occupational History   Occupation: Retired    Comment: Art gallery manager  Tobacco Use   Smoking status: Never   Smokeless tobacco: Never  Vaping Use   Vaping status: Never Used  Substance and Sexual Activity   Alcohol use: No   Drug use: No   Sexual activity: Not on file  Other Topics Concern   Not on file  Social History Narrative   Right handed   No caffeine   Two story home   Retired   Surveyor, quantity   Social Determinants of Corporate investment banker Strain: Not on file  Food Insecurity: Not on file  Transportation Needs: Not on file  Physical Activity: Not on file  Stress: Not on file  Social Connections: Not on file  Intimate Partner Violence: Not on file   Ryszard Warta is retired. His daughter is Interior and spatial designer of nursing for a nursing home in [place], [place].    FAMILY HISTORY: Family History  Problem Relation Age of Onset   Stroke Mother    Hypertension Mother    Language disorder Mother        Aphasia; unknown cause   Alzheimer's disease Father    Heart failure Father    Dementia Sister        Lewy body   Down syndrome Daughter     ALLERGIES:  is allergic to other and no known allergies.  MEDICATIONS:  Current Outpatient Medications  Medication Sig Dispense Refill   B Complex-C (SUPER B-COMPLEX + VITAMIN C PO) Take 1 capsule by mouth daily with supper.     cyanocobalamin 1000 MCG tablet Take 1,000 mcg by mouth daily.     denosumab (PROLIA) 60 MG/ML SOSY injection Inject 60 mg into the skin every 6 (six) months.     FREESTYLE LITE test strip       glucose monitoring kit (FREESTYLE) monitoring kit 1 each by Does not apply route as needed for other.     Lancets (FREESTYLE) lancets 1 each daily.     lenalidomide (REVLIMID) 5 MG capsule TAKE 1 CAPSULE DAILY FOR 21 DAYS ON THEN 7 DAYS OFF AND REPEAT EVERY 28 DAYS 21 capsule 0   lenalidomide (REVLIMID) 5 MG capsule TAKE 1 CAPSULE DAILY FOR 21 DAYS ON, THEN 7 DAYS OFF. REPEAT EVERY 28 DAYS 21 capsule 0   Magnesium 400 MG CAPS Take 400 mg by mouth daily.     memantine (NAMENDA) 5 MG tablet Take 1 tablet (5 mg total) by mouth at bedtime. 30 tablet 11   metFORMIN (GLUCOPHAGE) 1000 MG tablet TAKE ONE-HALF (1/2) TABLET  DAILY WITH BREAKFAST 90 tablet 3   nitroGLYCERIN (NITROSTAT) 0.4 MG SL tablet Place 1 tablet (0.4 mg total) under the tongue every 5 (five) minutes as needed for chest pain (Max 3 doses in 15 mins.). 30 tablet 0   omeprazole (PRILOSEC) 20 MG capsule Take 20 mg by mouth every morning.      Potassium Gluconate 80 MG TABS as directed Orally     pravastatin (PRAVACHOL) 10 MG tablet Take 1 tablet (10 mg total) by mouth daily. 90 tablet 2   pregabalin (LYRICA) 75 MG capsule Take 75 mg by mouth 2 (two) times daily.     rivaroxaban (XARELTO) 20 MG TABS tablet TAKE 1 TABLET DAILY WITH SUPPER 90 tablet 2   Turmeric (QC TUMERIC COMPLEX PO) Take 1 capsule by mouth daily.     Vitamin D, Ergocalciferol, (DRISDOL) 1.25 MG (50000 UNIT) CAPS capsule TAKE 1 CAPSULE BY MOUTH 1 TIME A WEEK (Patient taking differently: 50,000 Units every 7 (seven) days. Takes on Weds) 12 capsule 5   No current facility-administered medications for this visit.    REVIEW OF SYSTEMS:    10 Point review of Systems was done is negative except as noted above.   PHYSICAL EXAMINATION:   ECOG PERFORMANCE STATUS: 2 - Symptomatic, <50% confined to bed Vitals:   10/27/23 0915  BP: 131/76  Pulse: (!) 54  Resp: 18  SpO2: 100%     Wt Readings from Last 3 Encounters:  08/06/23 201 lb 4.8 oz (91.3 kg)  07/29/23 199 lb (90.3  kg)  05/14/23 199 lb 9.6 oz (90.5 kg)   Body mass index is 29.01 kg/m.  NAD GENERAL:alert, in no acute distress and comfortable SKIN: no acute rashes, no significant lesions EYES: conjunctiva are pink and non-injected, sclera anicteric OROPHARYNX: MMM, no exudates, no oropharyngeal erythema or ulceration NECK: supple, no JVD LYMPH:  no palpable lymphadenopathy in the cervical, axillary or inguinal regions LUNGS: clear to auscultation b/l with normal respiratory effort HEART: regular rate & rhythm ABDOMEN:  normoactive bowel sounds , non tender, not distended. Extremity: no pedal edema PSYCH: alert & oriented x 3 with fluent speech NEURO: no focal motor/sensory deficits   LABORATORY DATA:  I have reviewed the data as listed.     Latest Ref Rng & Units 10/20/2023    9:04 AM 10/10/2023    5:36 PM 08/05/2023    3:22 PM  CBC  WBC 4.0 - 10.5 K/uL 2.8  2.7  3.9   Hemoglobin 13.0 - 17.0 g/dL 09.8  11.9  14.7   Hematocrit 39.0 - 52.0 % 39.3  42.8  36.4   Platelets 150 - 400 K/uL 92  115  139   ANC 1.5k     Latest Ref Rng & Units 10/20/2023    9:04 AM 10/10/2023    5:36 PM 08/05/2023    3:22 PM  CMP  Glucose 70 - 99 mg/dL 829  562  130   BUN 8 - 23 mg/dL 23  16  21    Creatinine 0.61 - 1.24 mg/dL 8.65  7.84  6.96   Sodium 135 - 145 mmol/L 136  140  140   Potassium 3.5 - 5.1 mmol/L 4.3  4.1  4.1   Chloride 98 - 111 mmol/L 105  107  109   CO2 22 - 32 mmol/L 24  24  24    Calcium 8.9 - 10.3 mg/dL 8.6  9.0  8.0   Total Protein 6.5 - 8.1 g/dL 6.3  6.7  6.1   Total Bilirubin 0.3 - 1.2 mg/dL 0.9  0.8  0.5   Alkaline Phos 38 - 126 U/L 43  49  52   AST 15 - 41 U/L 20  23  16    ALT 0 - 44 U/L 9  12  8          RADIOGRAPHIC STUDIES: I have personally reviewed the radiological images as listed and agreed with the findings in the report.       Bone Marrow Biopsy 12/24/2016 (Accession [id number])  Diagnosis Bone Marrow, Aspirate,Biopsy, and Clot, left iliac crest - HYPERCELLULAR  BONE MARROW FOR AGE WITH PLASMA CELL NEOPLASM. - SEE COMMENT. PERIPHERAL BLOOD: - NORMOCYTIC-NORMOCHROMIC ANEMIA. - LEUKOPENIA.  DG Bone Survey Met (Accession [id number]) (Order [id number])  Imaging  Date: 11/28/2016 Department: [facility]-RADIOLOGY[facility]AGNOSTIC Released By: Kenton Kingfisher Authorizing: Johney Maine, MD  Exam Information   Status Exam Begun  Exam Ended   Final 99 11/28/2016 11:59 AM 11/28/2016 12:36 PM  PACS Images   Show images for DG Bone Survey Met  Study Result   CLINICAL DATA:  Monoclonal gammopathy of unknown significance. History of squamous cell carcinoma excision, basal cell carcinoma excision.   EXAM: METASTATIC BONE SURVEY   COMPARISON:  CT neck, chest, abdomen and pelvis from 10/29/16, MRI of the head from 06/01/2016, CXR 10/27/2016, lumbar spine radiographs 06/24/2016   FINDINGS: Lateral skull: Small occipital lucency which may represent a normal arachnoid granulation posteriorly in the occiput. No definite lytic abnormality.   Cervical spine AP and lateral: Disc space narrowing at C2-3, and from C4 through C7. C4-5, C5-6 and C6-7 uncovertebral joint spurring bilaterally. No lytic abnormality.   Thoracic spine AP and lateral: T8-9 right-sided osteophytes and to a lesser degree T9-10. No lytic abnormality. Slight multilevel lumbar thoracic disc space narrowing likely degenerative.   Lumbar spine AP and lateral: Disc space narrowing at L4-5 and to a greater extent L5-S1. No lytic disease. L3 through S1 facet sclerosis.   AP pelvis: Negative for lytic disease.   Bilateral upper and lower extremities shoulders through wrist and from the hips through ankle: Negative for lytic disease. Joint space narrowing of the femorotibial compartments both knees. Subchondral cyst of the right patella.   CXR: Clear lungs. Cardiac implantable monitoring device projects over the left heart. Aortic atherosclerosis. No lytic  disease.   IMPRESSION: No findings suspicious for lytic disease.     Electronically Signed   By: Tollie Eth M.D.   On: 11/28/2016 14:58     ASSESSMENT & PLAN:   82 y.o. 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 (12/2016) Peak M spike 2.6  Cytogenetics and Myeloma FISH panel.- trisomy 11 Patient has normocytic anemia without any other clear etiology. (>2g/dl lower that lower limit of normal which is 13) which per criteria would place this in the Multiple myeloma category as opposed to Smoldering Multiple myeloma (Borderline criterion)  No overt renal failure or hypercalcemia at this time No focal bone pains though he has significant chronic back pain related to degenerative disc disease. Bone survey shows no overt lytic lesions.  10/16/18 CT Coronary Morph revealed Coronary artery calcium score 299 Agatston units, this places the patient in the 47th percentile for age and gender, suggesting intermediate risk for future cardiac events. 2. Nonobstructive coronary disease.  12/08/18 Bone Density study revealed that the pt has osteoporosis  2) Anemia and  thrombocytopenia -- related to treatment (Revlimid) -stable  3) Grade 1 Neuropathy/radiculopathy-- in b/l feet and halfway up lower leg without pain  4) Borderline low B12 levels, previously 299 --- on replacement -  -continue replacement.  5) h/o recurrent CVA - thought to be related to small vessel disease as per neurology. Has a small PFO which could be an additional risk factor as well as his afib  6)  P afib Plan -Continue on Xarelto per cardiology.  7) H/o recurrent SCC -Underwent surgical excision on right forearm with Dr. Phylliss Blakes on 07/28/18. His surgical pathology showed evidence of Squamous Cell Carcinoma. Plan -continue dermatology followup  10) Lower extremity discomfort - improved, stable. Likely Discogenic pain -[place] on 12/2017  revealed no blood clot -Pt has established care with orthopedist and PT. - has had muscle cramps from his statins and this is being mx by his cardiologist.  11) Patient Active Problem List   Diagnosis Date Noted   Mild cognitive impairment of uncertain or unknown etiology 07/16/2023   History of squamous cell carcinoma    IPMN (intraductal papillary mucinous neoplasm) 02/20/2022   Osteoporosis 03/10/2019   Multiple myeloma without remission 03/10/2019   Chest pain 09/27/2018   Degeneration of lumbar intervertebral disc 01/30/2018   Low back pain 01/30/2018   Lumbar radiculopathy 01/30/2018   History of loop recorder 06/12/2017   Gait abnormality    Benign essential hypertension    Paroxysmal SVT (supraventricular tachycardia) (HCC)    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   TIA (transient ischemic attack) 06/02/2016   Right arm numbness 06/01/2016   Atherosclerosis of native coronary artery of native heart without angina pectoris 03/20/2016   Multiple lacunar infarcts 2017   GERD (gastroesophageal reflux disease)    Gout    S/P RF ablation operation for arrhythmia 12/13/2014   Hyperlipidemia 12/13/2014   Controlled type 2 diabetes mellitus with complication, without long-term current use of insulin 12/07/2014    PLAN: -Discussed lab results from 10/20/2023 in detail with the patient. CBC showed low WBC at 2.8 K and low Platelets at 92 K. CMP showed slightly decreased calcium level of 8.6.  -Discussed Kappa/Lambda light chain results from 10/20/2023 with the patient. Sowed elevated Kappa light chain at 37.2 and elevated Lambda light chain at 30.0, but normal kappa/lambda light chain of 1.24.  -Discussed Multiple myeloma panel results from 10/20/2023 in detail. Showed stable M-Protein of 0.2.  -Recommend to continue B-Complex and Vitamin-D supplement.  -Discussed with the patient that low WBC because patient took an extra week of Revlimid.  -Patient  has been tolerating his current Revlimid dosage well without any new or severe toxicities.  -Continue current Revlimid dosage.for maintenance  -Answered all of patient's questions.  -recommend to continue to follow-up with Dermatologist and Neurologist.  -Continue Prolia.   FOLLOW UP: Return to clinic with Dr. Candise Che in 12 weeks Labs in 11 weeks PLz schedule next 2 doses of Prolia every 6 months  The total time spent in the appointment was 30 minutes* .  All of the patient's questions were answered with apparent satisfaction. The patient knows to call the clinic with any problems, questions or concerns.   Wyvonnia Lora MD MS AAHIVMS [facility] Hematology/Oncology Physician [facility]  .*Total Encounter Time as defined by the Centers for Medicare and Medicaid Services includes, in addition to the face-to-face time of a patient visit (documented in the note above) non-face-to-face time: obtaining and reviewing  outside history, ordering and reviewing medications, tests or procedures, care coordination (communications with other health care professionals or caregivers) and documentation in the medical record.   I,Param Shah,acting as a Neurosurgeon for Wyvonnia Lora, MD.,have documented all relevant documentation on the behalf of Wyvonnia Lora, MD,as directed by  Wyvonnia Lora, MD while in the presence of Wyvonnia Lora, MD.   .I have reviewed the above documentation for accuracy and completeness, and I agree with the above. Johney Maine MD

## 2023-10-29 ENCOUNTER — Encounter: Payer: Self-pay | Admitting: Physician Assistant

## 2023-10-29 ENCOUNTER — Telehealth: Payer: Self-pay | Admitting: Hematology

## 2023-10-29 ENCOUNTER — Ambulatory Visit (INDEPENDENT_AMBULATORY_CARE_PROVIDER_SITE_OTHER): Payer: Medicare Other | Admitting: Physician Assistant

## 2023-10-29 VITALS — BP 133/73 | HR 67 | Resp 20 | Ht 68.0 in | Wt 192.0 lb

## 2023-10-29 DIAGNOSIS — C4442 Squamous cell carcinoma of skin of scalp and neck: Secondary | ICD-10-CM | POA: Diagnosis not present

## 2023-10-29 DIAGNOSIS — R202 Paresthesia of skin: Secondary | ICD-10-CM

## 2023-10-29 DIAGNOSIS — M545 Low back pain, unspecified: Secondary | ICD-10-CM | POA: Diagnosis not present

## 2023-10-29 DIAGNOSIS — R200 Anesthesia of skin: Secondary | ICD-10-CM | POA: Diagnosis not present

## 2023-10-29 NOTE — Telephone Encounter (Signed)
Per Candise Che 10/27/2023 los patient is aware of scheduled appointment times/dates

## 2023-10-29 NOTE — Patient Instructions (Addendum)
It was a pleasure to see you today at our office.   Recommendations:  Folllow up  March 5  at 11:30  Keep taking B12 supplements 1000 micrograms daily  Continue Memantine 5mg  tablets.  Take 1 tablet at bedtime  MRI L spine to look at the back .  Nerve conduction study  Whom to call:  Memory  decline, memory medications: Call our office [phone]   For psychiatric meds, mood meds: Please have your primary care physician manage these medications.      For assessment of decision of mental capacity and competency:  Call Dr. Erick Blinks, geriatric psychiatrist at [phone]  For guidance in geriatric dementia issues please call [facility] [phone]    If you have any severe symptoms of a stroke, or other severe issues such as confusion,severe chills or fever, etc call 911 or go to the ER as you may need to be evaluated further      RECOMMENDATIONS FOR ALL PATIENTS WITH MEMORY PROBLEMS: 1. Continue to exercise (Recommend 30 minutes of walking everyday, or 3 hours every week) 2. Increase social interactions - continue going to [place] and enjoy social gatherings with friends and family 3. Eat healthy, avoid fried foods and eat more fruits and vegetables 4. Maintain adequate blood pressure, blood sugar, and blood cholesterol level. Reducing the risk of stroke and cardiovascular disease also helps promoting better memory. 5. Avoid stressful situations. Live a simple life and avoid aggravations. Organize your time and prepare for the next day in anticipation. 6. Sleep well, avoid any interruptions of sleep and avoid any distractions in the bedroom that may interfere with adequate sleep quality 7. Avoid sugar, avoid sweets as there is a strong link between excessive sugar intake, diabetes, and cognitive impairment We discussed the Mediterranean diet, which has been shown to help patients reduce the risk of progressive memory disorders and reduces cardiovascular risk.  This includes eating fish, eat fruits and green leafy vegetables, nuts like almonds and hazelnuts, walnuts, and also use olive oil. Avoid fast foods and fried foods as much as possible. Avoid sweets and sugar as sugar use has been linked to worsening of memory function.  There is always a concern of gradual progression of memory problems. If this is the case, then we may need to adjust level of care according to patient needs. Support, both to the patient and caregiver, should then be put into place.      You have been referred for a neuropsychological evaluation (i.e., evaluation of memory and thinking abilities). Please bring someone with you to this appointment if possible, as it is helpful for the doctor to hear from both you and another adult who knows you well. Please bring eyeglasses and hearing aids if you wear them.    The evaluation will take approximately 3 hours and has two parts:   The first part is a clinical interview with the neuropsychologist (Dr. Milbert Coulter or Dr. Roseanne Reno). During the interview, the neuropsychologist will speak with you and the individual you brought to the appointment.    The second part of the evaluation is testing with the doctor's technician Annabelle Harman or Selena Batten). During the testing, the technician will ask you to remember different types of material, solve problems, and answer some questionnaires. Your family member will not be present for this portion of the evaluation.   Please note: We must reserve several hours of the neuropsychologist's time and the psychometrician's time for your evaluation appointment. As such, there is  a No-Show fee of $100. If you are unable to attend any of your appointments, please contact our office as soon as possible to reschedule.    FALL PRECAUTIONS: Be cautious when walking. Scan the area for obstacles that may increase the risk of trips and falls. When getting up in the mornings, sit up at the edge of the bed for a few minutes before  getting out of bed. Consider elevating the bed at the head end to avoid drop of blood pressure when getting up. Walk always in a well-lit room (use night lights in the walls). Avoid area rugs or power cords from appliances in the middle of the walkways. Use a walker or a cane if necessary and consider physical therapy for balance exercise. Get your eyesight checked regularly.  FINANCIAL OVERSIGHT: Supervision, especially oversight when making financial decisions or transactions is also recommended.  HOME SAFETY: Consider the safety of the kitchen when operating appliances like stoves, microwave oven, and blender. Consider having supervision and share cooking responsibilities until no longer able to participate in those. Accidents with firearms and other hazards in the house should be identified and addressed as well.   ABILITY TO BE LEFT ALONE: If patient is unable to contact 911 operator, consider using Life[facility], or when the need is there, arrange for someone to stay with patients. Smoking is a fire hazard, consider supervision or cessation. Risk of wandering should be assessed by caregiver and if detected at any point, supervision and safe proof recommendations should be instituted.  MEDICATION SUPERVISION: Inability to self-administer medication needs to be constantly addressed. Implement a mechanism to ensure safe administration of the medications.   DRIVING: Regarding driving, in patients with progressive memory problems, driving will be impaired. We advise to have someone else do the driving if trouble finding directions or if minor accidents are reported. Independent driving assessment is available to determine safety of driving.   If you are interested in the driving assessment, you can contact the following:  [facility] [employer] [facility] in [place] [phone]  [facility] [phone]  [facility] [phone] or  [phone]    Mediterranean Diet A Mediterranean diet refers to food and lifestyle choices that are based on the traditions of countries located on the [employer] [place]. This way of eating has been shown to help prevent certain conditions and improve outcomes for people who have chronic diseases, like kidney disease and heart disease. What are tips for following this plan? Lifestyle  Cook and eat meals together with your family, when possible. Drink enough fluid to keep your urine clear or pale yellow. Be physically active every day. This includes: Aerobic exercise like running or swimming. Leisure activities like gardening, walking, or housework. Get 7-8 hours of sleep each night. If recommended by your health care provider, drink red wine in moderation. This means 1 glass a day for nonpregnant women and 2 glasses a day for men. A glass of wine equals 5 oz (150 mL). Reading food labels  Check the serving size of packaged foods. For foods such as rice and pasta, the serving size refers to the amount of cooked product, not dry. Check the total fat in packaged foods. Avoid foods that have saturated fat or trans fats. Check the ingredients list for added sugars, such as corn syrup. Shopping  At the grocery store, buy most of your food from the areas near the walls of the store. This includes: Fresh fruits and vegetables (produce). Grains,  beans, nuts, and seeds. Some of these may be available in unpackaged forms or large amounts (in bulk). Fresh seafood. Poultry and eggs. Low-fat dairy products. Buy whole ingredients instead of prepackaged foods. Buy fresh fruits and vegetables in-season from local farmers markets. Buy frozen fruits and vegetables in resealable bags. If you do not have access to quality fresh seafood, buy precooked frozen shrimp or canned fish, such as tuna, salmon, or sardines. Buy small amounts of raw or cooked vegetables, salads, or olives from the deli or salad bar  at your store. Stock your pantry so you always have certain foods on hand, such as olive oil, canned tuna, canned tomatoes, rice, pasta, and beans. Cooking  Cook foods with extra-virgin olive oil instead of using butter or other vegetable oils. Have meat as a side dish, and have vegetables or grains as your main dish. This means having meat in small portions or adding small amounts of meat to foods like pasta or stew. Use beans or vegetables instead of meat in common dishes like chili or lasagna. Experiment with different cooking methods. Try roasting or broiling vegetables instead of steaming or sauteing them. Add frozen vegetables to soups, stews, pasta, or rice. Add nuts or seeds for added healthy fat at each meal. You can add these to yogurt, salads, or vegetable dishes. Marinate fish or vegetables using olive oil, lemon juice, garlic, and fresh herbs. Meal planning  Plan to eat 1 vegetarian meal one day each week. Try to work up to 2 vegetarian meals, if possible. Eat seafood 2 or more times a week. Have healthy snacks readily available, such as: Vegetable sticks with hummus. Greek yogurt. Fruit and nut trail mix. Eat balanced meals throughout the week. This includes: Fruit: 2-3 servings a day Vegetables: 4-5 servings a day Low-fat dairy: 2 servings a day Fish, poultry, or lean meat: 1 serving a day Beans and legumes: 2 or more servings a week Nuts and seeds: 1-2 servings a day Whole grains: 6-8 servings a day Extra-virgin olive oil: 3-4 servings a day Limit red meat and sweets to only a few servings a month What are my food choices? Mediterranean diet Recommended Grains: Whole-grain pasta. Brown rice. Bulgar wheat. Polenta. Couscous. Whole-wheat bread. Orpah Cobb. Vegetables: Artichokes. Beets. Broccoli. Cabbage. Carrots. Eggplant. Green beans. Chard. Kale. Spinach. Onions. Leeks. Peas. Squash. Tomatoes. Peppers. Radishes. Fruits: Apples. Apricots. Avocado. Berries.  Bananas. Cherries. Dates. Figs. Grapes. Lemons. Melon. Oranges. Peaches. Plums. Pomegranate. Meats and other protein foods: Beans. Almonds. Sunflower seeds. Pine nuts. Peanuts. Cod. Salmon. Scallops. Shrimp. Tuna. Tilapia. Clams. Oysters. Eggs. Dairy: Low-fat milk. Cheese. Greek yogurt. Beverages: Water. Red wine. Herbal tea. Fats and oils: Extra virgin olive oil. Avocado oil. Grape seed oil. Sweets and desserts: [place] yogurt with honey. Baked apples. Poached pears. Trail mix. Seasoning and other foods: Basil. Cilantro. Coriander. Cumin. Mint. Parsley. Sage. Rosemary. Tarragon. Garlic. Oregano. Thyme. Pepper. Balsalmic vinegar. Tahini. Hummus. Tomato sauce. Olives. Mushrooms. Limit these Grains: Prepackaged pasta or rice dishes. Prepackaged cereal with added sugar. Vegetables: Deep fried potatoes (french fries). Fruits: Fruit canned in syrup. Meats and other protein foods: Beef. Pork. Lamb. Poultry with skin. Hot dogs. Tomasa Blase. Dairy: Ice cream. Sour cream. Whole milk. Beverages: Juice. Sugar-sweetened soft drinks. Beer. Liquor and spirits. Fats and oils: Butter. Canola oil. Vegetable oil. Beef fat (tallow). Lard. Sweets and desserts: Cookies. Cakes. Pies. Candy. Seasoning and other foods: Mayonnaise. Premade sauces and marinades. The items listed may not be a complete list. Talk with your dietitian about  what dietary choices are right for you. Summary The Mediterranean diet includes both food and lifestyle choices. Eat a variety of fresh fruits and vegetables, beans, nuts, seeds, and whole grains. Limit the amount of red meat and sweets that you eat. Talk with your health care provider about whether it is safe for you to drink red wine in moderation. This means 1 glass a day for nonpregnant women and 2 glasses a day for men. A glass of wine equals 5 oz (150 mL). This information is not intended to replace advice given to you by your health care provider. Make sure you discuss any questions you  have with your health care provider. Document Released: 08/01/2016 Document Revised: 09/03/2016 Document Reviewed: 08/01/2016 Elsevier Interactive Patient Education  2017 [employer].  Your provider has requested that you have labwork completed today. Please go to [facility] ([id number]) on the second floor of this building before leaving the office today. You do not need to check in. If you are not called within 15 minutes please check with the front desk.

## 2023-11-02 ENCOUNTER — Encounter: Payer: Self-pay | Admitting: Hematology

## 2023-11-06 DIAGNOSIS — R051 Acute cough: Secondary | ICD-10-CM | POA: Diagnosis not present

## 2023-11-06 DIAGNOSIS — Z03818 Encounter for observation for suspected exposure to other biological agents ruled out: Secondary | ICD-10-CM | POA: Diagnosis not present

## 2023-11-06 DIAGNOSIS — J069 Acute upper respiratory infection, unspecified: Secondary | ICD-10-CM | POA: Diagnosis not present

## 2023-11-13 DIAGNOSIS — R269 Unspecified abnormalities of gait and mobility: Secondary | ICD-10-CM | POA: Diagnosis not present

## 2023-11-13 DIAGNOSIS — R531 Weakness: Secondary | ICD-10-CM | POA: Diagnosis not present

## 2023-11-13 DIAGNOSIS — M79604 Pain in right leg: Secondary | ICD-10-CM | POA: Diagnosis not present

## 2023-11-13 DIAGNOSIS — M79605 Pain in left leg: Secondary | ICD-10-CM | POA: Diagnosis not present

## 2023-11-14 ENCOUNTER — Inpatient Hospital Stay: Payer: Medicare Other

## 2023-11-14 VITALS — BP 130/65 | HR 54 | Temp 97.9°F | Resp 18

## 2023-11-14 DIAGNOSIS — C9000 Multiple myeloma not having achieved remission: Secondary | ICD-10-CM

## 2023-11-14 DIAGNOSIS — E538 Deficiency of other specified B group vitamins: Secondary | ICD-10-CM | POA: Diagnosis not present

## 2023-11-14 DIAGNOSIS — M5410 Radiculopathy, site unspecified: Secondary | ICD-10-CM | POA: Diagnosis not present

## 2023-11-14 DIAGNOSIS — D649 Anemia, unspecified: Secondary | ICD-10-CM | POA: Diagnosis not present

## 2023-11-14 DIAGNOSIS — D696 Thrombocytopenia, unspecified: Secondary | ICD-10-CM | POA: Diagnosis not present

## 2023-11-14 DIAGNOSIS — M818 Other osteoporosis without current pathological fracture: Secondary | ICD-10-CM

## 2023-11-14 DIAGNOSIS — I480 Paroxysmal atrial fibrillation: Secondary | ICD-10-CM | POA: Diagnosis not present

## 2023-11-14 MED ORDER — DENOSUMAB 60 MG/ML ~~LOC~~ SOSY
60.0000 mg | PREFILLED_SYRINGE | Freq: Once | SUBCUTANEOUS | Status: AC
  Administered 2023-11-14: 60 mg via SUBCUTANEOUS
  Filled 2023-11-14: qty 1

## 2023-11-14 MED ORDER — SODIUM CHLORIDE 0.9 % IV SOLN: Freq: Once | INTRAVENOUS | Status: DC

## 2023-11-18 ENCOUNTER — Other Ambulatory Visit: Payer: Self-pay | Admitting: Hematology

## 2023-11-18 DIAGNOSIS — Z5189 Encounter for other specified aftercare: Secondary | ICD-10-CM | POA: Diagnosis not present

## 2023-11-18 DIAGNOSIS — D0439 Carcinoma in situ of skin of other parts of face: Secondary | ICD-10-CM | POA: Diagnosis not present

## 2023-11-18 DIAGNOSIS — D485 Neoplasm of uncertain behavior of skin: Secondary | ICD-10-CM | POA: Diagnosis not present

## 2023-11-18 DIAGNOSIS — M79604 Pain in right leg: Secondary | ICD-10-CM | POA: Diagnosis not present

## 2023-11-18 DIAGNOSIS — R531 Weakness: Secondary | ICD-10-CM | POA: Diagnosis not present

## 2023-11-18 DIAGNOSIS — M79605 Pain in left leg: Secondary | ICD-10-CM | POA: Diagnosis not present

## 2023-11-18 DIAGNOSIS — R269 Unspecified abnormalities of gait and mobility: Secondary | ICD-10-CM | POA: Diagnosis not present

## 2023-11-28 ENCOUNTER — Other Ambulatory Visit: Payer: Self-pay | Admitting: Internal Medicine

## 2023-11-28 ENCOUNTER — Telehealth: Payer: Self-pay | Admitting: *Deleted

## 2023-11-28 DIAGNOSIS — I480 Paroxysmal atrial fibrillation: Secondary | ICD-10-CM

## 2023-11-28 MED ORDER — RIVAROXABAN 20 MG PO TABS: 20.0000 mg | ORAL_TABLET | Freq: Every day | ORAL | 0 refills | Status: DC

## 2023-11-28 NOTE — Telephone Encounter (Signed)
-----   Message from [place] H sent at 11/28/2023  2:55 PM EST ----- Regarding: Medication refill  #STAT# If patient is at the pharmacy, call can be transferred to refill team.   1. Which medications need to be refilled? (please list name of each medication and dose if known) metFORMIN (GLUCOPHAGE) 1000 MG tablet [[id number]] AND  XARELTO 20 MG TABS tablet [[id number]]   2. Would you like to learn more about the convenience, safety, & potential cost savings by using the [facility]? No   3. Are you open to using the [facility] (Type [facility]. NO   4. Which pharmacy/location (including street and city if local pharmacy) is medication to be sent to?  [id number] [place], [place] - [address] AT [facility] [address]  Phone: [phone] Fax: [phone]     5. Do they need a 30 day or 90 day supply? 90 DAY IF POSSIBLE

## 2023-11-28 NOTE — Telephone Encounter (Signed)
Prescription refill request for Xarelto received.  Indication: a fib Last office visit:01/13/23 Weight: 192 Age: 82 Scr: 1.23 10/20/23 epic CrCl: 56 mL/min

## 2023-11-28 NOTE — Telephone Encounter (Signed)
Patient aware he will need to get metformin from his medical doctor. Forwarded to pharmacist for xarelto.

## 2023-12-02 DIAGNOSIS — M79604 Pain in right leg: Secondary | ICD-10-CM | POA: Diagnosis not present

## 2023-12-02 DIAGNOSIS — C4442 Squamous cell carcinoma of skin of scalp and neck: Secondary | ICD-10-CM | POA: Diagnosis not present

## 2023-12-02 DIAGNOSIS — M79605 Pain in left leg: Secondary | ICD-10-CM | POA: Diagnosis not present

## 2023-12-02 DIAGNOSIS — R269 Unspecified abnormalities of gait and mobility: Secondary | ICD-10-CM | POA: Diagnosis not present

## 2023-12-02 DIAGNOSIS — R531 Weakness: Secondary | ICD-10-CM | POA: Diagnosis not present

## 2023-12-07 ENCOUNTER — Ambulatory Visit
Admission: RE | Admit: 2023-12-07 | Discharge: 2023-12-07 | Disposition: A | Discharge status: Home / Self Care | Payer: Medicare Other | Source: Ambulatory Visit | Attending: Physician Assistant | Admitting: Physician Assistant

## 2023-12-07 DIAGNOSIS — M48061 Spinal stenosis, lumbar region without neurogenic claudication: Secondary | ICD-10-CM | POA: Diagnosis not present

## 2023-12-07 DIAGNOSIS — M5126 Other intervertebral disc displacement, lumbar region: Secondary | ICD-10-CM | POA: Diagnosis not present

## 2023-12-07 MED ORDER — GADOPICLENOL 0.5 MMOL/ML IV SOLN
9.0000 mL | Freq: Once | INTRAVENOUS | Status: AC | PRN
  Administered 2023-12-07: 9 mL via INTRAVENOUS

## 2023-12-14 NOTE — Progress Notes (Signed)
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 Or a neurosurgeon? If so, what it his/her name to do the referral , to check on the sine, bones in the area. Thanks

## 2023-12-23 ENCOUNTER — Other Ambulatory Visit: Payer: Self-pay

## 2023-12-23 MED ORDER — LENALIDOMIDE 5 MG PO CAPS: 5.0000 mg | ORAL_CAPSULE | Freq: Every day | ORAL | 0 refills | Status: DC

## 2023-12-23 NOTE — Progress Notes (Signed)
Sent my chart message multiple attempts, to call office.

## 2023-12-25 ENCOUNTER — Telehealth: Payer: Self-pay | Admitting: Physician Assistant

## 2023-12-25 NOTE — Telephone Encounter (Signed)
 Pt would like to get the results of the MRI

## 2023-12-25 NOTE — Telephone Encounter (Signed)
Answering machine again

## 2023-12-26 NOTE — Telephone Encounter (Signed)
 Pt called back in and left a message. He is wanting his MRI results

## 2023-12-30 ENCOUNTER — Other Ambulatory Visit: Payer: Self-pay

## 2023-12-30 ENCOUNTER — Other Ambulatory Visit (HOSPITAL_COMMUNITY): Payer: Self-pay

## 2024-01-06 DIAGNOSIS — C44329 Squamous cell carcinoma of skin of other parts of face: Secondary | ICD-10-CM | POA: Diagnosis not present

## 2024-01-09 ENCOUNTER — Other Ambulatory Visit: Payer: Self-pay

## 2024-01-09 DIAGNOSIS — C9000 Multiple myeloma not having achieved remission: Secondary | ICD-10-CM

## 2024-01-12 ENCOUNTER — Inpatient Hospital Stay: Payer: Medicare Other | Attending: Hematology

## 2024-01-12 ENCOUNTER — Other Ambulatory Visit: Payer: Self-pay

## 2024-01-12 DIAGNOSIS — C9000 Multiple myeloma not having achieved remission: Secondary | ICD-10-CM | POA: Diagnosis not present

## 2024-01-12 LAB — CBC WITH DIFFERENTIAL (CANCER CENTER ONLY)
Abs Immature Granulocytes: 0.01 10*3/uL (ref 0.00–0.07)
Basophils Absolute: 0.1 10*3/uL (ref 0.0–0.1)
Basophils Relative: 2 %
Eosinophils Absolute: 0.1 10*3/uL (ref 0.0–0.5)
Eosinophils Relative: 4 %
HCT: 35.9 % — ABNORMAL LOW (ref 39.0–52.0)
Hemoglobin: 12.5 g/dL — ABNORMAL LOW (ref 13.0–17.0)
Immature Granulocytes: 0 %
Lymphocytes Relative: 23 %
Lymphs Abs: 0.8 10*3/uL (ref 0.7–4.0)
MCH: 30.4 pg (ref 26.0–34.0)
MCHC: 34.8 g/dL (ref 30.0–36.0)
MCV: 87.3 fL (ref 80.0–100.0)
Monocytes Absolute: 0.3 10*3/uL (ref 0.1–1.0)
Monocytes Relative: 10 %
Neutro Abs: 2.1 10*3/uL (ref 1.7–7.7)
Neutrophils Relative %: 61 %
Platelet Count: 105 10*3/uL — ABNORMAL LOW (ref 150–400)
RBC: 4.11 MIL/uL — ABNORMAL LOW (ref 4.22–5.81)
RDW: 13.8 % (ref 11.5–15.5)
WBC Count: 3.3 10*3/uL — ABNORMAL LOW (ref 4.0–10.5)
nRBC: 0 % (ref 0.0–0.2)

## 2024-01-12 LAB — CMP (CANCER CENTER ONLY)
ALT: 8 U/L (ref 0–44)
AST: 19 U/L (ref 15–41)
Albumin: 3.8 g/dL (ref 3.5–5.0)
Alkaline Phosphatase: 52 U/L (ref 38–126)
Anion gap: 6 (ref 5–15)
BUN: 20 mg/dL (ref 8–23)
CO2: 25 mmol/L (ref 22–32)
Calcium: 8.5 mg/dL — ABNORMAL LOW (ref 8.9–10.3)
Chloride: 106 mmol/L (ref 98–111)
Creatinine: 1.25 mg/dL — ABNORMAL HIGH (ref 0.61–1.24)
GFR, Estimated: 57 mL/min — ABNORMAL LOW (ref >60)
Glucose, Bld: 113 mg/dL — ABNORMAL HIGH (ref 70–99)
Potassium: 4.2 mmol/L (ref 3.5–5.1)
Sodium: 137 mmol/L (ref 135–145)
Total Bilirubin: 1 mg/dL (ref 0.0–1.2)
Total Protein: 6.3 g/dL — ABNORMAL LOW (ref 6.5–8.1)

## 2024-01-13 DIAGNOSIS — G629 Polyneuropathy, unspecified: Secondary | ICD-10-CM | POA: Diagnosis not present

## 2024-01-13 DIAGNOSIS — I679 Cerebrovascular disease, unspecified: Secondary | ICD-10-CM | POA: Diagnosis not present

## 2024-01-13 DIAGNOSIS — I25119 Atherosclerotic heart disease of native coronary artery with unspecified angina pectoris: Secondary | ICD-10-CM | POA: Diagnosis not present

## 2024-01-13 DIAGNOSIS — E1169 Type 2 diabetes mellitus with other specified complication: Secondary | ICD-10-CM | POA: Diagnosis not present

## 2024-01-13 DIAGNOSIS — I4891 Unspecified atrial fibrillation: Secondary | ICD-10-CM | POA: Diagnosis not present

## 2024-01-13 DIAGNOSIS — E7800 Pure hypercholesterolemia, unspecified: Secondary | ICD-10-CM | POA: Diagnosis not present

## 2024-01-13 DIAGNOSIS — I10 Essential (primary) hypertension: Secondary | ICD-10-CM | POA: Diagnosis not present

## 2024-01-13 DIAGNOSIS — D61818 Other pancytopenia: Secondary | ICD-10-CM | POA: Diagnosis not present

## 2024-01-13 DIAGNOSIS — C9000 Multiple myeloma not having achieved remission: Secondary | ICD-10-CM | POA: Diagnosis not present

## 2024-01-13 DIAGNOSIS — G3184 Mild cognitive impairment, so stated: Secondary | ICD-10-CM | POA: Diagnosis not present

## 2024-01-13 DIAGNOSIS — Z0000 Encounter for general adult medical examination without abnormal findings: Secondary | ICD-10-CM | POA: Diagnosis not present

## 2024-01-13 LAB — LAB REPORT - SCANNED
A1c: 6.8
Albumin, Urine POC: 1.23
Albumin/Creatinine Ratio, Urine, POC: 10.3
Creatinine, POC: 120 mg/dL
EGFR: 58

## 2024-01-13 LAB — KAPPA/LAMBDA LIGHT CHAINS
Kappa free light chain: 38.3 mg/L — ABNORMAL HIGH (ref 3.3–19.4)
Kappa, lambda light chain ratio: 1.39 (ref 0.26–1.65)
Lambda free light chains: 27.5 mg/L — ABNORMAL HIGH (ref 5.7–26.3)

## 2024-01-18 LAB — MULTIPLE MYELOMA PANEL, SERUM
Albumin SerPl Elph-Mcnc: 3.4 g/dL (ref 2.9–4.4)
Albumin/Glob SerPl: 1.4 (ref 0.7–1.7)
Alpha 1: 0.2 g/dL (ref 0.0–0.4)
Alpha2 Glob SerPl Elph-Mcnc: 0.6 g/dL (ref 0.4–1.0)
B-Globulin SerPl Elph-Mcnc: 0.9 g/dL (ref 0.7–1.3)
Gamma Glob SerPl Elph-Mcnc: 0.9 g/dL (ref 0.4–1.8)
Globulin, Total: 2.5 g/dL (ref 2.2–3.9)
IgA: 90 mg/dL (ref 61–437)
IgG (Immunoglobin G), Serum: 940 mg/dL (ref 603–1613)
IgM (Immunoglobulin M), Srm: 21 mg/dL (ref 15–143)
M Protein SerPl Elph-Mcnc: 0.1 g/dL — ABNORMAL HIGH
Total Protein ELP: 5.9 g/dL — ABNORMAL LOW (ref 6.0–8.5)

## 2024-01-19 ENCOUNTER — Ambulatory Visit (HOSPITAL_BASED_OUTPATIENT_CLINIC_OR_DEPARTMENT_OTHER): Payer: Medicare Other | Admitting: Hematology

## 2024-01-19 DIAGNOSIS — J209 Acute bronchitis, unspecified: Secondary | ICD-10-CM | POA: Diagnosis not present

## 2024-01-19 DIAGNOSIS — B001 Herpesviral vesicular dermatitis: Secondary | ICD-10-CM | POA: Diagnosis not present

## 2024-01-19 DIAGNOSIS — C9000 Multiple myeloma not having achieved remission: Secondary | ICD-10-CM

## 2024-01-19 DIAGNOSIS — R051 Acute cough: Secondary | ICD-10-CM | POA: Diagnosis not present

## 2024-01-19 NOTE — Progress Notes (Signed)
HEMATOLOGY/ONCOLOGY TELE-MED VISIT NOTE  Date of Service: 01/19/24   Patient Care Team: Lupita Raider, MD as PCP - General (Family Medicine) Rennis Golden Justin Abu, MD as PCP - Cardiology (Cardiology)   CHIEF COMPLAINTS:  Follow-up for continued evaluation and management of multiple myeloma  HISTORY OF PRESENTING ILLNESS:  Please see previous note for details of initial presentation  CURRENT THERAPY:   Revlimid maintenance 5 mg po 3 weeks on 1 week off.   INTERVAL HISTORY:  Justin [name] is a 83 y.o. male who is connected via phone for continued evaluation and management of his multiple myeloma.  .I connected with Justin [name] at  1:00 PM EST by telephone visit and verified that I am speaking with the correct person using two identifiers.   Patient was last seen by me on 10/27/2023 and she complained of chronic back pain, but was doing well overall.   Patient notes he has been doing fairly well since our last visit. He complains of cold sores inside his mouth, congestion, and cough with phlegm for the past week. He denies getting tested for COVID-19, Influenza, or RSV.   He denies any fever, chills, night sweats, unexpected weight loss, back pain, chest pain, or leg swelling.   Patient is UTD with RSV vaccine, COVID-19 vaccine, influenza vaccine, and other age-related vaccines.   Patient is currently on week-off Revlimid. He has been tolerating his Revlimid well without any new or severe toxicities.   I discussed the limitations, risks, security and privacy concerns of performing an evaluation and management service by telemedicine and the availability of in-person appointments. I also discussed with the patient that there may be a patient responsible charge related to this service. The patient expressed understanding and agreed to proceed.   Other persons participating in the visit and their role in the encounter: None   Patient's location: Home  Provider's  location: [facility]   Chief Complaint: multiple myeloma.   MEDICAL HISTORY:  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/30/2018   Gait abnormality    GERD (gastroesophageal reflux disease)    Gout    History of loop recorder    History of squamous cell carcinoma    Hyperlipidemia 12/13/2014   IPMN (intraductal papillary mucinous neoplasm) 02/20/2022   Left leg weakness    Low back pain 01/30/2018   Lumbar radiculopathy 01/30/2018   Mild cognitive impairment of uncertain or unknown etiology 07/16/2023   Multiple lacunar infarcts 2017   Bilateral basal ganglia; right frontal white matter     Multiple myeloma without remission 03/10/2019   Numbness 06/01/2016   Osteoporosis 03/10/2019   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   Paroxysmal SVT (supraventricular tachycardia) (HCC)    a. 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 Ladona Ridgel   BASAL CELL CARCINOMA EXCISION     off of back   ELECTROPHYSIOLOGIC STUDY  N/A 05/01/2015   Procedure: SVT Ablation;  Surgeon: Marinus Maw, MD;  Location: [facility] INVASIVE CV LAB;  Service: Cardiovascular;  Laterality: N/A;   EP IMPLANTABLE DEVICE N/A 06/04/2016   Procedure: Loop Recorder Insertion;  Surgeon: Hillis Range, MD;  Location: [facility] INVASIVE CV LAB;  Service: Cardiovascular;  Laterality: N/A;   implantable loop recorder removal     ILR removed by Dr Johney Frame    INGUINAL HERNIA REPAIR Bilateral 06/16/2017   Procedure: LAPAROSCOPIC BILATERAL INGUINAL HERNIA REPAIR;  Surgeon: Berna Bue, MD;  Location: [facility] OR;  Service: General;  Laterality: Bilateral;   INSERTION OF MESH Bilateral 06/16/2017   Procedure: INSERTION OF MESH;  Surgeon: Berna Bue, MD;  Location: [facility] OR;  Service: General;  Laterality: Bilateral;   MASS EXCISION Right 07/28/2018   Procedure: EXCISION OF RIGHT FOREARM MASS;  Surgeon: Berna Bue, MD;  Location: [facility] ORS;  Service: General;  Laterality: Right;   SQUAMOUS CELL CARCINOMA EXCISION     TEE WITHOUT CARDIOVERSION N/A 06/04/2016   Procedure: TRANSESOPHAGEAL ECHOCARDIOGRAM (TEE);  Surgeon: Chrystie Nose, MD;  Location: [facility] ENDOSCOPY;  Service: Cardiovascular;  Laterality: N/A;    SOCIAL HISTORY: Social History   Socioeconomic History   Marital status: Married    Spouse name: Not on file   Number of children: 3   Years of education: 17   Highest education level: Bachelor's degree (e.g., BA, AB, BS)  Occupational History   Occupation: Retired    Comment: Art gallery manager  Tobacco Use   Smoking status: Never   Smokeless tobacco: Never  Vaping Use   Vaping status: Never Used  Substance and Sexual Activity   Alcohol use: No   Drug use: No   Sexual activity: Not on file  Other Topics Concern   Not on file  Social History Narrative   Right handed   No caffeine   Two story home   Retired   Surveyor, quantity   Social Drivers of Corporate investment banker Strain: Not on file  Food Insecurity: Not on file  Transportation Needs: Not on file  Physical Activity: Not on file  Stress: Not on file  Social Connections: Not on file  Intimate Partner Violence: Not on file   Justin [name] is retired. His daughter is Interior and spatial designer of nursing for a nursing home in [place], [place].    FAMILY HISTORY: Family History  Problem Relation Age of Onset   Stroke Mother    Hypertension Mother    Language disorder Mother        Aphasia;  unknown cause   Alzheimer's disease Father    Heart failure Father    Dementia Sister        Lewy body   Down syndrome Daughter     ALLERGIES:  is allergic to other and no known allergies.  MEDICATIONS:  Current Outpatient Medications  Medication Sig Dispense Refill   B Complex-C (SUPER B-COMPLEX + VITAMIN C PO) Take 1 capsule by mouth daily with supper.     cyanocobalamin 1000 MCG tablet Take 1,000 mcg by mouth daily.     denosumab (PROLIA) 60 MG/ML SOSY injection Inject 60 mg into the skin every 6 (six) months.     FREESTYLE LITE test strip      glucose monitoring kit (FREESTYLE) monitoring kit 1 each by Does not apply route as needed for other.     Lancets (FREESTYLE) lancets 1 each daily.     lenalidomide (REVLIMID) 5 MG capsule TAKE 1  CAPSULE DAILY FOR 21 DAYS ON THEN 7 DAYS OFF AND REPEAT EVERY 28 DAYS 21 capsule 0   lenalidomide (REVLIMID) 5 MG capsule TAKE 1 CAPSULE DAILY FOR 21 DAYS ON, THEN 7 DAYS OFF. REPEAT EVERY 28 DAYS 21 capsule 0   lenalidomide (REVLIMID) 5 MG capsule Take 1 capsule (5 mg total) by mouth daily. Take 1 capsule (5 mg total) daily for 21 days and then take 7 days off. Repeat. 21 capsule 0   Magnesium 400 MG CAPS Take 400 mg by mouth daily.     memantine (NAMENDA) 5 MG tablet Take 1 tablet (5 mg total) by mouth at bedtime. 30 tablet 11   metFORMIN (GLUCOPHAGE) 1000 MG tablet TAKE ONE-HALF (1/2) TABLET DAILY WITH BREAKFAST 90 tablet 3   nitroGLYCERIN (NITROSTAT) 0.4 MG SL tablet Place 1 tablet (0.4 mg total) under the tongue every 5 (five) minutes as needed for chest pain (Max 3 doses in 15 mins.). 30 tablet 0   omeprazole (PRILOSEC) 20 MG capsule Take 20 mg by mouth every morning.      Potassium Gluconate 80 MG TABS as directed Orally     pravastatin (PRAVACHOL) 10 MG tablet Take 1 tablet (10 mg total) by mouth daily. 90 tablet 2   pregabalin (LYRICA) 75 MG capsule Take 75 mg by mouth 2 (two) times daily.     rivaroxaban (XARELTO) 20 MG TABS tablet Take 1  tablet (20 mg total) by mouth daily with supper. 90 tablet 0   Turmeric (QC TUMERIC COMPLEX PO) Take 1 capsule by mouth daily.     Vitamin D, Ergocalciferol, (DRISDOL) 1.25 MG (50000 UNIT) CAPS capsule TAKE 1 CAPSULE BY MOUTH 1 TIME A WEEK (Patient taking differently: 50,000 Units every 7 (seven) days. Takes on Weds) 12 capsule 5   No current facility-administered medications for this visit.    REVIEW OF SYSTEMS:    10 Point review of Systems was done is negative except as noted above.   PHYSICAL EXAMINATION:  Telemedicine visit  LABORATORY DATA:  I have reviewed the data as listed.     Latest Ref Rng & Units 01/12/2024    1:03 PM 10/20/2023    9:04 AM 10/10/2023    5:36 PM  CBC  WBC 4.0 - 10.5 K/uL 3.3  2.8  2.7   Hemoglobin 13.0 - 17.0 g/dL 09.8  11.9  14.7   Hematocrit 39.0 - 52.0 % 35.9  39.3  42.8   Platelets 150 - 400 K/uL 105  92  115        Latest Ref Rng & Units 01/12/2024    1:03 PM 10/20/2023    9:04 AM 10/10/2023    5:36 PM  CMP  Glucose 70 - 99 mg/dL 829  562  130   BUN 8 - 23 mg/dL 20  23  16    Creatinine 0.61 - 1.24 mg/dL 8.65  7.84  6.96   Sodium 135 - 145 mmol/L 137  136  140   Potassium 3.5 - 5.1 mmol/L 4.2  4.3  4.1   Chloride 98 - 111 mmol/L 106  105  107   CO2 22 - 32 mmol/L 25  24  24    Calcium 8.9 - 10.3 mg/dL 8.5  8.6  9.0   Total Protein 6.5 - 8.1 g/dL 6.3  6.3  6.7   Total Bilirubin 0.0 - 1.2 mg/dL 1.0  0.9  0.8   Alkaline Phos 38 - 126 U/L 52  43  49   AST  15 - 41 U/L 19  20  23    ALT 0 - 44 U/L 8  9  12          RADIOGRAPHIC STUDIES: I have personally reviewed the radiological images as listed and agreed with the findings in the report.       Bone Marrow Biopsy 12/24/2016 (Accession [id number])  Diagnosis Bone Marrow, Aspirate,Biopsy, and Clot, left iliac crest - HYPERCELLULAR BONE MARROW FOR AGE WITH PLASMA CELL NEOPLASM. - SEE COMMENT. PERIPHERAL BLOOD: - NORMOCYTIC-NORMOCHROMIC ANEMIA. - LEUKOPENIA.  DG Bone Survey Met  (Accession [id number]) (Order [id number])  Imaging  Date: 11/28/2016 Department: [facility]-RADIOLOGY[facility]STIC Released By: Kenton Kingfisher Authorizing: Johney Maine, MD  Exam Information   Status Exam Begun  Exam Ended   Final 99 11/28/2016 11:59 AM 11/28/2016 12:36 PM  PACS Images   Show images for DG Bone Survey Met  Study Result   CLINICAL DATA:  Monoclonal gammopathy of unknown significance. History of squamous cell carcinoma excision, basal cell carcinoma excision.   EXAM: METASTATIC BONE SURVEY   COMPARISON:  CT neck, chest, abdomen and pelvis from 10/29/16, MRI of the head from 06/01/2016, CXR 10/27/2016, lumbar spine radiographs 06/24/2016   FINDINGS: Lateral skull: Small occipital lucency which may represent a normal arachnoid granulation posteriorly in the occiput. No definite lytic abnormality.   Cervical spine AP and lateral: Disc space narrowing at C2-3, and from C4 through C7. C4-5, C5-6 and C6-7 uncovertebral joint spurring bilaterally. No lytic abnormality.   Thoracic spine AP and lateral: T8-9 right-sided osteophytes and to a lesser degree T9-10. No lytic abnormality. Slight multilevel lumbar thoracic disc space narrowing likely degenerative.   Lumbar spine AP and lateral: Disc space narrowing at L4-5 and to a greater extent L5-S1. No lytic disease. L3 through S1 facet sclerosis.   AP pelvis: Negative for lytic disease.   Bilateral upper and lower extremities shoulders through wrist and from the hips through ankle: Negative for lytic disease. Joint space narrowing of the femorotibial compartments both knees. Subchondral cyst of the right patella.   CXR: Clear lungs. Cardiac implantable monitoring device projects over the left heart. Aortic atherosclerosis. No lytic disease.   IMPRESSION: No findings suspicious for lytic disease.     Electronically Signed   By: Tollie Eth M.D.   On: 11/28/2016 14:58     ASSESSMENT  & PLAN:   83 y.o. 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 (12/2016) Peak M spike 2.6  Cytogenetics and Myeloma FISH panel.- trisomy 11 Patient has normocytic anemia without any other clear etiology. (>2g/dl lower that lower limit of normal which is 13) which per criteria would place this in the Multiple myeloma category as opposed to Smoldering Multiple myeloma (Borderline criterion)  No overt renal failure or hypercalcemia at this time No focal bone pains though he has significant chronic back pain related to degenerative disc disease. Bone survey shows no overt lytic lesions.  10/16/18 CT Coronary Morph revealed Coronary artery calcium score 299 Agatston units, this places the patient in the 47th percentile for age and gender, suggesting intermediate risk for future cardiac events. 2. Nonobstructive coronary disease.  12/08/18 Bone Density study revealed that the pt has osteoporosis  2) Anemia and thrombocytopenia -- related to treatment (Revlimid) -stable  3) Grade 1 Neuropathy/radiculopathy-- in b/l feet and halfway up lower leg without pain  4) Borderline low B12 levels, previously 299 ---  on replacement -  -continue replacement.  5) h/o recurrent CVA - thought to be related to small vessel disease as per neurology. Has a small PFO which could be an additional risk factor as well as his afib  6)  P afib Plan -Continue on Xarelto per cardiology.  7) H/o recurrent SCC -Underwent surgical excision on right forearm with Dr. Phylliss Blakes on 07/28/18. His surgical pathology showed evidence of Squamous Cell Carcinoma. Plan -continue dermatology followup  10) Lower extremity discomfort - improved, stable. Likely Discogenic pain -[place] on 12/2017 revealed no blood clot -Pt has established care with orthopedist and PT. - has had muscle cramps from his statins and this is being mx by his  cardiologist.  11) Patient Active Problem List   Diagnosis Date Noted   Mild cognitive impairment of uncertain or unknown etiology 07/16/2023   History of squamous cell carcinoma    IPMN (intraductal papillary mucinous neoplasm) 02/20/2022   Osteoporosis 03/10/2019   Multiple myeloma without remission 03/10/2019   Chest pain 09/27/2018   Degeneration of lumbar intervertebral disc 01/30/2018   Low back pain 01/30/2018   Lumbar radiculopathy 01/30/2018   History of loop recorder 06/12/2017   Gait abnormality    Benign essential hypertension    Paroxysmal SVT (supraventricular tachycardia) (HCC)    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 CBC shows low WBC of 3.3 K, low hemoglobin of 12.5 g/dL with hematocrit of 16.1%, and low platelets of 105 K. Cmp shows elevated creatinine of 1.25 and slightly low calcium level of 8.5.  -Discussed Multiple Myeloma panel results from 01/12/2024 in detail with the patient. Showed M-protein of 0.1.  -Discussed Kappa/Lambda light chain results from 01/12/2024. Showed normal kappa/lambda light chain ratio of 1.39. -Recommend to continue B-Complex and Vitamin-D supplement.  -Patient has been tolerating his current Revlimid dosage well without any new or severe toxicities.  -Continue current Revlimid dosage.for maintenance  -Answered all of patient's questions.  -recommend to continue to follow-up with  Dermatologist and Neurologist.  -Continue Prolia.  -Recommend to visit PCP or urgent care if symptoms gets worse and get tested for COVID-19, influenza, RSV.   FOLLOW UP: Return to clinic with Dr. Candise Che in 12 weeks Labs in 11 weeks PLz schedule next 2 doses of Prolia every 6 months  The total time spent in the appointment was 20 minutes* .  All of the patient's questions were answered with apparent satisfaction. The patient knows to call the clinic with any problems, questions or concerns.   Wyvonnia Lora MD MS AAHIVMS [facility] Hematology/Oncology Physician [facility]  .*Total Encounter Time as defined by the Centers for Medicare and Medicaid Services includes, in addition to the face-to-face time of a patient visit (documented in the note above) non-face-to-face time: obtaining and reviewing outside history, ordering and reviewing medications, tests or procedures, care coordination (communications with other health care professionals or caregivers) and documentation in the medical record.   I,Param Shah,acting  as a Neurosurgeon for Wyvonnia Lora, MD.,have documented all relevant documentation on the behalf of Wyvonnia Lora, MD,as directed by  Wyvonnia Lora, MD while in the presence of Wyvonnia Lora, MD.  .I have reviewed the above documentation for accuracy and completeness, and I agree with the above. Johney Maine MD

## 2024-01-22 ENCOUNTER — Other Ambulatory Visit: Payer: Self-pay

## 2024-01-22 MED ORDER — LENALIDOMIDE 5 MG PO CAPS: 5.0000 mg | ORAL_CAPSULE | Freq: Every day | ORAL | 0 refills | Status: DC

## 2024-01-25 ENCOUNTER — Encounter: Payer: Self-pay | Admitting: Hematology

## 2024-01-29 ENCOUNTER — Encounter: Payer: Self-pay | Admitting: Physician Assistant

## 2024-01-29 ENCOUNTER — Ambulatory Visit (INDEPENDENT_AMBULATORY_CARE_PROVIDER_SITE_OTHER): Payer: Medicare Other | Admitting: Physician Assistant

## 2024-01-29 VITALS — BP 110/76 | HR 100 | Resp 20 | Ht 68.0 in | Wt 178.0 lb

## 2024-01-29 DIAGNOSIS — R413 Other amnesia: Secondary | ICD-10-CM

## 2024-01-29 DIAGNOSIS — M545 Low back pain, unspecified: Secondary | ICD-10-CM

## 2024-01-29 NOTE — Progress Notes (Signed)
 Assessment/Plan:   Mild Cognitive Impairment of unclear etiology, concern for vascular etiology  Justin [name] is a very pleasant 83 y.o. RH male   retired occupational hygienist with a history of hypertension, hyperlipidemia, multiple myeloma, history of ADL, DM2, DJD, GERD, gout, B12 deficiency, history of CVA 2017 with bilateral basal ganglia and right frontal white matter lacunar infarcts, history of thrombocytopenia history of parotid mass, and a history of MCI of unclear etiology per neuropsych evaluation on 06/2023 currently on memantine  5 mg nightly.  Overall, his memory is stable, with MMSE of 27/30.  Mood is good.  He is able to participate in his ADLs continues to drive without difficulty.  As for his back issues, in view of MRI of the lumbar spine showing moderate L3-L4 spinal canal stenosis due to large disc bulge, he will be referred to orthopedics..     Recommendations:   Follow up in 6   months. Repeat neuropsych evaluation in 10 to 16 months for diagnostic clarity Continue memantine  5 mg nightly, side effects discussed (history of bradycardia) Continue B12 supplementation Recommend good control of cardiovascular risk factors Continue to control mood as per PCP Monitor driving Referral to orthopedics for degenerative disc disease-bulging disc L3-L4 spinal stenosis       Subjective:   This patient is here alone. Previous records as well as any outside records available were reviewed prior to todays visit.   Patient was last seen on November 2024     Any changes in memory since last visit?  About the same. LTM is good. 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 Visits family.  Does not do many brain games   repeats oneself?  Endorsed.  Disoriented when walking into a room?  Patient denies    Misplacing objects?  A little.    Wandering behavior?   denies   Any personality changes since last visit?   Denies.   Any worsening depression?: A  little anxiety.  Hallucinations or paranoia?  denies   Seizures?   denies    Any sleep changes? Sleeps well.  Denies vivid dreams, REM behavior or sleepwalking   Sleep apnea?   Denies.    Any hygiene concerns?   denies   Independent of bathing and dressing?  Endorsed  Does the patient needs help with medications? Patient is in charge   Who is in charge of the finances?  Patient is in charge, involving his wife more.      Any changes in appetite?  Denies.      Patient have trouble swallowing?  denies   Does the patient cook? Yes, denies forgetting common recipes.  Any kitchen accidents such as leaving the stove on?   denies   Any headaches?    denies   Vision changes? denies Chronic pain?  Some back pain in L3-L4. Denies saddle anesthesia  Ambulates with difficulty?   Walk ok .    Recent falls or head injuries?    Denies.       Unilateral weakness, numbness or tingling?   denies   Any tremors?  denies   Any anosmia?    denies   Any incontinence of urine?  denies   Any bowel dysfunction?  denies      Patient lives with wife      Does the patient drive?yes denies getting lost    Nov 2024 visit for paresthesias   He is seen today prior to his scheduled  visit after presenting to the ED with lower extremity weakness initially thought to be due to new CVA, however   Latest MRI of the brain 10/10/2023 without acute intracranial abnormalities, same old strokes visualized chronic microvascular ischemic changes in the white matter. Consultation with Neurology yielded no suspicions for CVA/TIA.  The patient reports a history of disc disease, has been investigated in the past by neurosurgery.  He received lumbar injections at the time.  Apparently, he has a history of sciatica, but he failed to follow-up with them.  He cannot remember the name of the neurosurgeon.  On October 10, 2023, he was in his usual state of health, went to sleep and woke up trying to get up turned to a side to reach the  lights, but had to call his daughter r who is a nurse, complaining of lower extremity weakness he told him to go to the ER with findings as above.  He now reports intermittent episodes of saddle anesthesia, without any additional symptoms in the groin area such as urine or stool difficulties.  He has some paresthesias on the lower extremities left greater than right, however he does carry a history of chemo induced and diabetic neuropathy.  He denies any lower extremity weakness.  He does complain of right sciatic pain.  He denies any other neurological complaints.  He is able to walk, intermittently complaining of some weird sensation in both legs .Denies any headache, nausea, vomiting, shortness of breath, chest pain, recent travel.  No vision changes.  No GI or GU complaints.     Initial visit 10/11/20203 How long did patient have memory difficulties?  For the last year, worse over the 2 months have more difficulty with names of known people including daughter and grand-daughter, missing appointments, and sometimes he forgets in the middle of the conversation when he was talking about.  He reports that he is STM is worse than LTM. repeats oneself? Endorsed  Disoriented when walking into a room?  Patient denies   Leaving objects in unusual places?  Patient denies   Patient lives with wife and her son  Ambulates  with difficulty?   Feels a little unsteady due to a history of peripheral neuropathy due to diabetes, not recently fell on the rear end without LOC or head injury. Set to participate in PT/OT.  Otherwise, he walks daily and works on the yard.  History of seizures?   Patient denies   Wandering behavior?  Patient denies   Patient drives?   No issues  Any mood changes ?  Sometimes I get upset with myself because I can't remember something and can be frustrating .  Any depression?:  Patient denies   Hallucinations?  Patient denies   Paranoia?  Patient denies   Patient reports that sleeps  well without vivid dreams, REM behavior or sleepwalking .   History of sleep apnea?  Patient denies   Any hygiene concerns?  Patient denies   Independent of bathing and dressing?  Endorsed  Does the patient needs help with medications?   Patient in charge  Who is in charge of the finances? Patient  is in charge   Any changes in appetite?  Patient denies   Patient have trouble swallowing? Patient denies   Does the patient cook?  Patient denies   Any kitchen accidents such as leaving the stove on? Patient denies   Any headaches?  Patient denies   Double vision? Patient denies   Any focal numbness or  tingling?  Has bilateral diabetic neuropathy and spinal stenosis, he is on Lyrica  tolerating well.  Chronic back pain?  Patient denies   Unilateral weakness?  Patient denies   Any tremors?  Patient denies   Any history of anosmia?  Patient denies   Any incontinence of urine?  Wears pads just in case because I may dribble Any bowel dysfunction?   Intermittent diarrhea or constipation due to Revlimid   History of heavy alcohol intake?  Patient denies   History of heavy tobacco use?  Patient denies   Family history of dementia?   Sister died with LBD   MRI of the lumbar spine with and without contrast remarkable for moderate L3-L4 spinal canal stenosis due to large disc bulge and moderate facet hypertrophy.  No other spinal canal or neural foramina stenosis.   MRI of the brain 10/10/2023 without evidence of acute cranial abnormality, moderate chronic microvascular ischemic changes, small remote lacunar infarct in the right frontal white matter, no pathologic enhancement.  No pathological atrophy is noted  Past Medical History:  Diagnosis Date   Acute blood loss anemia    Arthritis    Atherosclerosis of native coronary artery of native heart without angina pectoris 03/20/2016   Benign essential hypertension    Chest pain 09/27/2018   Controlled type 2 diabetes mellitus with complication,  without long-term current use of insulin  12/07/2014   Degeneration of lumbar intervertebral disc 01/30/2018   Gait abnormality    GERD (gastroesophageal reflux disease)    Gout    History of loop recorder    History of squamous cell carcinoma    Hyperlipidemia 12/13/2014   IPMN (intraductal papillary mucinous neoplasm) 02/20/2022   Left leg weakness    Low back pain 01/30/2018   Lumbar radiculopathy 01/30/2018   Mild cognitive impairment of uncertain or unknown etiology 07/16/2023   Multiple lacunar infarcts 2017   Bilateral basal ganglia; right frontal white matter     Multiple myeloma without remission 03/10/2019   Numbness 06/01/2016   Osteoporosis 03/10/2019   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   Paroxysmal SVT (supraventricular tachycardia) (HCC)    a. 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   Right arm numbness 06/01/2016   S/P RF ablation operation for arrhythmia 12/13/2014   TIA (transient ischemic attack) 06/02/2016     Past Surgical History:  Procedure Laterality Date   ATRIAL FIBRILLATION ABLATION     Dr. Waddell   BASAL CELL CARCINOMA EXCISION     off of back   ELECTROPHYSIOLOGIC STUDY N/A 05/01/2015   Procedure: SVT Ablation;  Surgeon: Danelle LELON Waddell, MD;  Location: [facility] INVASIVE CV LAB;  Service: Cardiovascular;  Laterality: N/A;   EP IMPLANTABLE DEVICE N/A 06/04/2016   Procedure: Loop Recorder Insertion;  Surgeon: Lynwood Rakers, MD;  Location: [facility] INVASIVE CV LAB;  Service: Cardiovascular;  Laterality: N/A;   implantable loop recorder removal     ILR removed by Dr Rakers   INGUINAL HERNIA REPAIR Bilateral 06/16/2017   Procedure: LAPAROSCOPIC BILATERAL INGUINAL HERNIA REPAIR;  Surgeon: Signe Mitzie LABOR, MD;  Location: [facility] OR;  Service: General;  Laterality: Bilateral;   INSERTION OF MESH Bilateral 06/16/2017   Procedure: INSERTION OF MESH;  Surgeon: Signe Mitzie LABOR, MD;  Location: [facility] OR;  Service: General;  Laterality: Bilateral;   MASS EXCISION  Right 07/28/2018   Procedure: EXCISION OF RIGHT FOREARM MASS;  Surgeon: Signe Mitzie LABOR, MD;  Location: [facility] ORS;  Service: General;  Laterality: Right;  SQUAMOUS CELL CARCINOMA EXCISION     TEE WITHOUT CARDIOVERSION N/A 06/04/2016   Procedure: TRANSESOPHAGEAL ECHOCARDIOGRAM (TEE);  Surgeon: Vinie JAYSON Maxcy, MD;  Location: [facility] ENDOSCOPY;  Service: Cardiovascular;  Laterality: N/A;     PREVIOUS MEDICATIONS:   CURRENT MEDICATIONS:  Outpatient Encounter Medications as of 01/29/2024  Medication Sig   B Complex-C (SUPER B-COMPLEX + VITAMIN C PO) Take 1 capsule by mouth daily with supper.   cyanocobalamin  1000 MCG tablet Take 1,000 mcg by mouth daily.   denosumab  (PROLIA ) 60 MG/ML SOSY injection Inject 60 mg into the skin every 6 (six) months.   FREESTYLE LITE test strip    glucose monitoring kit (FREESTYLE) monitoring kit 1 each by Does not apply route as needed for other.   Lancets (FREESTYLE) lancets 1 each daily.   lenalidomide  (REVLIMID ) 5 MG capsule TAKE 1 CAPSULE DAILY FOR 21 DAYS ON THEN 7 DAYS OFF AND REPEAT EVERY 28 DAYS   lenalidomide  (REVLIMID ) 5 MG capsule TAKE 1 CAPSULE DAILY FOR 21 DAYS ON, THEN 7 DAYS OFF. REPEAT EVERY 28 DAYS   lenalidomide  (REVLIMID ) 5 MG capsule Take 1 capsule (5 mg total) by mouth daily. Take 1 capsule (5 mg total) daily for 21 days and then take 7 days off. Repeat.   Magnesium 400 MG CAPS Take 400 mg by mouth daily.   memantine  (NAMENDA ) 5 MG tablet Take 1 tablet (5 mg total) by mouth at bedtime.   metFORMIN  (GLUCOPHAGE ) 1000 MG tablet TAKE ONE-HALF (1/2) TABLET DAILY WITH BREAKFAST   nitroGLYCERIN  (NITROSTAT ) 0.4 MG SL tablet Place 1 tablet (0.4 mg total) under the tongue every 5 (five) minutes as needed for chest pain (Max 3 doses in 15 mins.).   omeprazole  (PRILOSEC) 20 MG capsule Take 20 mg by mouth every morning.    Potassium Gluconate 80 MG TABS as directed Orally   pravastatin  (PRAVACHOL ) 10 MG tablet Take 1 tablet (10 mg total) by mouth daily.    pregabalin  (LYRICA ) 75 MG capsule Take 75 mg by mouth 2 (two) times daily.   rivaroxaban  (XARELTO ) 20 MG TABS tablet Take 1 tablet (20 mg total) by mouth daily with supper.   Turmeric (QC TUMERIC COMPLEX PO) Take 1 capsule by mouth daily.   Vitamin D , Ergocalciferol , (DRISDOL ) 1.25 MG (50000 UNIT) CAPS capsule TAKE 1 CAPSULE BY MOUTH 1 TIME A WEEK (Patient taking differently: 50,000 Units every 7 (seven) days. Takes on Weds)   No facility-administered encounter medications on file as of 01/29/2024.     Objective:     PHYSICAL EXAMINATION:    VITALS:   Vitals:   01/29/24 1123  BP: 110/76  Pulse: 100  Resp: 20  SpO2: 98%  Weight: 178 lb (80.7 kg)  Height: 5' 8 (1.727 m)    GEN:  The patient appears stated age and is in NAD. HEENT:  Normocephalic, atraumatic.   Neurological examination:  General: NAD, well-groomed, appears stated age. Orientation: The patient is alert. Oriented to person, place and date Cranial nerves: There is good facial symmetry.The speech is fluent and clear. No aphasia or dysarthria. Fund of knowledge is appropriate. Recent memory impaired and remote memory is normal.  Attention and concentration are normal.  Able to name objects and repeat phrases.  Hearing is intact to conversational tone  .   Delayed recall 1/3 Sensation: Sensation is intact to light touch throughout. Pain is reproducible around the right sciatic area around the gluteus  Motor: Strength is at least antigravity x4.SABRA  No fasciculations.  DTR's 2/4 in UE/LE      10/03/2022    7:00 AM  Montreal Cognitive Assessment   Visuospatial/ Executive (0/5) 5  Naming (0/3) 3  Attention: Read list of digits (0/2) 2  Attention: Read list of letters (0/1) 1  Attention: Serial 7 subtraction starting at 100 (0/3) 3  Language: Repeat phrase (0/2) 2  Language : Fluency (0/1) 1  Abstraction (0/2) 2  Delayed Recall (0/5) 2  Orientation (0/6) 5  Total 26  Adjusted Score (based on education) 26        01/29/2024   12:00 PM  MMSE - Mini Mental State Exam  Orientation to time 5  Orientation to Place 5  Registration 3  Attention/ Calculation 4  Recall 1  Language- name 2 objects 2  Language- repeat 1  Language- follow 3 step command 3  Language- read & follow direction 1  Write a sentence 1  Copy design 1  Total score 27       Movement examination: Tone: There is normal tone in the UE/LE Abnormal movements:  no tremor.  No myoclonus.  No asterixis.   Coordination:  There is no decremation with RAM's. Normal finger to nose  Gait and Station: The patient has no difficulty arising out of a deep-seated chair without the use of the hands. The patient's stride length is good.  Gait is cautious and narrow.   Thank you for allowing us  the opportunity to participate in the care of this nice patient. Please do not hesitate to contact us  for any questions or concerns.   Total time spent on today's visit was 30 minutes dedicated to this patient today, preparing to see patient, examining the patient, ordering tests and/or medications and counseling the patient, documenting clinical information in the EHR or other health record, independently interpreting results and communicating results to the patient/family, discussing treatment and goals, answering patient's questions and coordinating care.  Cc:  Loreli Kins, MD  Camie Sevin 01/29/2024 12:21 PM

## 2024-01-29 NOTE — Patient Instructions (Addendum)
 It was a pleasure to see you today at our office.   Recommendations:  Folllow up 6 months Keep taking B12 supplements 1000 micrograms daily  Continue Memantine  5mg  tablets.  Take 1 tablet at bedtime  Referral to Orthopedics   Whom to call:  Memory  decline, memory medications: Call our office [phone]   For psychiatric meds, mood meds: Please have your primary care physician manage these medications.      For assessment of decision of mental capacity and competency:  Call Dr. Rosaline Nine, geriatric psychiatrist at [phone]  For guidance in geriatric dementia issues please call [facility] [phone]    If you have any severe symptoms of a stroke, or other severe issues such as confusion,severe chills or fever, etc call 911 or go to the ER as you may need to be evaluated further      RECOMMENDATIONS FOR ALL PATIENTS WITH MEMORY PROBLEMS: 1. Continue to exercise (Recommend 30 minutes of walking everyday, or 3 hours every week) 2. Increase social interactions - continue going to [place] and enjoy social gatherings with friends and family 3. Eat healthy, avoid fried foods and eat more fruits and vegetables 4. Maintain adequate blood pressure, blood sugar, and blood cholesterol level. Reducing the risk of stroke and cardiovascular disease also helps promoting better memory. 5. Avoid stressful situations. Live a simple life and avoid aggravations. Organize your time and prepare for the next day in anticipation. 6. Sleep well, avoid any interruptions of sleep and avoid any distractions in the bedroom that may interfere with adequate sleep quality 7. Avoid sugar, avoid sweets as there is a strong link between excessive sugar intake, diabetes, and cognitive impairment We discussed the Mediterranean diet, which has been shown to help patients reduce the risk of progressive memory disorders and reduces cardiovascular risk. This includes eating fish, eat fruits and green  leafy vegetables, nuts like almonds and hazelnuts, walnuts, and also use olive oil. Avoid fast foods and fried foods as much as possible. Avoid sweets and sugar as sugar use has been linked to worsening of memory function.  There is always a concern of gradual progression of memory problems. If this is the case, then we may need to adjust level of care according to patient needs. Support, both to the patient and caregiver, should then be put into place.      You have been referred for a neuropsychological evaluation (i.e., evaluation of memory and thinking abilities). Please bring someone with you to this appointment if possible, as it is helpful for the doctor to hear from both you and another adult who knows you well. Please bring eyeglasses and hearing aids if you wear them.    The evaluation will take approximately 3 hours and has two parts:   The first part is a clinical interview with the neuropsychologist (Dr. Richie or Dr. Jackquline). During the interview, the neuropsychologist will speak with you and the individual you brought to the appointment.    The second part of the evaluation is testing with the doctor's technician Neal or Luke). During the testing, the technician will ask you to remember different types of material, solve problems, and answer some questionnaires. Your family member will not be present for this portion of the evaluation.   Please note: We must reserve several hours of the neuropsychologist's time and the psychometrician's time for your evaluation appointment. As such, there is a No-Show fee of $100. If you are unable to attend any of your  appointments, please contact our office as soon as possible to reschedule.    FALL PRECAUTIONS: Be cautious when walking. Scan the area for obstacles that may increase the risk of trips and falls. When getting up in the mornings, sit up at the edge of the bed for a few minutes before getting out of bed. Consider elevating the bed at  the head end to avoid drop of blood pressure when getting up. Walk always in a well-lit room (use night lights in the walls). Avoid area rugs or power cords from appliances in the middle of the walkways. Use a walker or a cane if necessary and consider physical therapy for balance exercise. Get your eyesight checked regularly.  FINANCIAL OVERSIGHT: Supervision, especially oversight when making financial decisions or transactions is also recommended.  HOME SAFETY: Consider the safety of the kitchen when operating appliances like stoves, microwave oven, and blender. Consider having supervision and share cooking responsibilities until no longer able to participate in those. Accidents with firearms and other hazards in the house should be identified and addressed as well.   ABILITY TO BE LEFT ALONE: If patient is unable to contact 911 operator, consider using [facility], or when the need is there, arrange for someone to stay with patients. Smoking is a fire hazard, consider supervision or cessation. Risk of wandering should be assessed by caregiver and if detected at any point, supervision and safe proof recommendations should be instituted.  MEDICATION SUPERVISION: Inability to self-administer medication needs to be constantly addressed. Implement a mechanism to ensure safe administration of the medications.   DRIVING: Regarding driving, in patients with progressive memory problems, driving will be impaired. We advise to have someone else do the driving if trouble finding directions or if minor accidents are reported. Independent driving assessment is available to determine safety of driving.   If you are interested in the driving assessment, you can contact the following:  [facility] [employer] in [place] [phone]  [facility] [phone]  [facility] [phone] or [phone]    Mediterranean Diet A Mediterranean diet  refers to food and lifestyle choices that are based on the traditions of countries located on the [place]. This way of eating has been shown to help prevent certain conditions and improve outcomes for people who have chronic diseases, like kidney disease and heart disease. What are tips for following this plan? Lifestyle  Cook and eat meals together with your family, when possible. Drink enough fluid to keep your urine clear or pale yellow. Be physically active every day. This includes: Aerobic exercise like running or swimming. Leisure activities like gardening, walking, or housework. Get 7-8 hours of sleep each night. If recommended by your health care provider, drink red wine in moderation. This means 1 glass a day for nonpregnant women and 2 glasses a day for men. A glass of wine equals 5 oz (150 mL). Reading food labels  Check the serving size of packaged foods. For foods such as rice and pasta, the serving size refers to the amount of cooked product, not dry. Check the total fat in packaged foods. Avoid foods that have saturated fat or trans fats. Check the ingredients list for added sugars, such as corn syrup. Shopping  At the grocery store, buy most of your food from the areas near the walls of the store. This includes: Fresh fruits and vegetables (produce). Grains, beans, nuts, and seeds. Some of these may be available in unpackaged forms or  large amounts (in bulk). Fresh seafood. Poultry and eggs. Low-fat dairy products. Buy whole ingredients instead of prepackaged foods. Buy fresh fruits and vegetables in-season from local farmers markets. Buy frozen fruits and vegetables in resealable bags. If you do not have access to quality fresh seafood, buy precooked frozen shrimp or canned fish, such as tuna, salmon, or sardines. Buy small amounts of raw or cooked vegetables, salads, or olives from the deli or salad bar at your store. Stock your pantry so you always have certain  foods on hand, such as olive oil, canned tuna, canned tomatoes, rice, pasta, and beans. Cooking  Cook foods with extra-virgin olive oil instead of using butter or other vegetable oils. Have meat as a side dish, and have vegetables or grains as your main dish. This means having meat in small portions or adding small amounts of meat to foods like pasta or stew. Use beans or vegetables instead of meat in common dishes like chili or lasagna. Experiment with different cooking methods. Try roasting or broiling vegetables instead of steaming or sauteing them. Add frozen vegetables to soups, stews, pasta, or rice. Add nuts or seeds for added healthy fat at each meal. You can add these to yogurt, salads, or vegetable dishes. Marinate fish or vegetables using olive oil, lemon juice, garlic, and fresh herbs. Meal planning  Plan to eat 1 vegetarian meal one day each week. Try to work up to 2 vegetarian meals, if possible. Eat seafood 2 or more times a week. Have healthy snacks readily available, such as: Vegetable sticks with hummus. Greek yogurt. Fruit and nut trail mix. Eat balanced meals throughout the week. This includes: Fruit: 2-3 servings a day Vegetables: 4-5 servings a day Low-fat dairy: 2 servings a day Fish, poultry, or lean meat: 1 serving a day Beans and legumes: 2 or more servings a week Nuts and seeds: 1-2 servings a day Whole grains: 6-8 servings a day Extra-virgin olive oil: 3-4 servings a day Limit red meat and sweets to only a few servings a month What are my food choices? Mediterranean diet Recommended Grains: Whole-grain pasta. Brown rice. Bulgar wheat. Polenta. Couscous. Whole-wheat bread. [place] Madeira. Vegetables: Artichokes. Beets. Broccoli. Cabbage. Carrots. Eggplant. Green beans. Chard. Kale. Spinach. Onions. Leeks. Peas. Squash. Tomatoes. Peppers. Radishes. Fruits: Apples. Apricots. Avocado. Berries. Bananas. Cherries. Dates. Figs. Grapes. Lemons. Melon. Oranges.  Peaches. Plums. Pomegranate. Meats and other protein foods: Beans. Almonds. Sunflower seeds. Pine nuts. Peanuts. Cod. Salmon. Scallops. Shrimp. Tuna. Tilapia. Clams. Oysters. Eggs. Dairy: Low-fat milk. Cheese. Greek yogurt. Beverages: Water. Red wine. Herbal tea. Fats and oils: Extra virgin olive oil. Avocado oil. Grape seed oil. Sweets and desserts: Greek yogurt with honey. Baked apples. Poached pears. Trail mix. Seasoning and other foods: Basil. Cilantro. Coriander. Cumin. Mint. Parsley. Sage. Rosemary. Tarragon. Garlic. Oregano. Thyme. Pepper. Balsalmic vinegar. Tahini. Hummus. Tomato sauce. Olives. Mushrooms. Limit these Grains: Prepackaged pasta or rice dishes. Prepackaged cereal with added sugar. Vegetables: Deep fried potatoes (french fries). Fruits: Fruit canned in syrup. Meats and other protein foods: Beef. Pork. Lamb. Poultry with skin. Hot dogs. [place]. Dairy: Ice cream. Sour cream. Whole milk. Beverages: Juice. Sugar-sweetened soft drinks. Beer. Liquor and spirits. Fats and oils: Butter. Canola oil. Vegetable oil. Beef fat (tallow). Lard. Sweets and desserts: Cookies. Cakes. Pies. Candy. Seasoning and other foods: Mayonnaise. Premade sauces and marinades. The items listed may not be a complete list. Talk with your dietitian about what dietary choices are right for you. Summary The Mediterranean diet includes both food  and lifestyle choices. Eat a variety of fresh fruits and vegetables, beans, nuts, seeds, and whole grains. Limit the amount of red meat and sweets that you eat. Talk with your health care provider about whether it is safe for you to drink red wine in moderation. This means 1 glass a day for nonpregnant women and 2 glasses a day for men. A glass of wine equals 5 oz (150 mL). This information is not intended to replace advice given to you by your health care provider. Make sure you discuss any questions you have with your health care provider. Document Released:  08/01/2016 Document Revised: 09/03/2016 Document Reviewed: 08/01/2016 Elsevier Interactive Patient Education  2017 [employer].  Your provider has requested that you have labwork completed today. Please go to [facility] ([id number]) on the second floor of this building before leaving the office today. You do not need to check in. If you are not called within 15 minutes please check with the front desk.

## 2024-02-13 ENCOUNTER — Telehealth: Payer: Self-pay | Admitting: Hematology

## 2024-02-13 NOTE — Telephone Encounter (Signed)
 Spoke with patient confirming upcoming appointment

## 2024-02-17 ENCOUNTER — Other Ambulatory Visit (HOSPITAL_COMMUNITY): Payer: Self-pay

## 2024-02-23 ENCOUNTER — Other Ambulatory Visit: Payer: Self-pay | Admitting: Internal Medicine

## 2024-02-25 ENCOUNTER — Ambulatory Visit: Payer: Medicare Other | Admitting: Physician Assistant

## 2024-02-27 DIAGNOSIS — E119 Type 2 diabetes mellitus without complications: Secondary | ICD-10-CM | POA: Diagnosis not present

## 2024-02-27 DIAGNOSIS — H524 Presbyopia: Secondary | ICD-10-CM | POA: Diagnosis not present

## 2024-02-27 DIAGNOSIS — H04123 Dry eye syndrome of bilateral lacrimal glands: Secondary | ICD-10-CM | POA: Diagnosis not present

## 2024-02-27 DIAGNOSIS — H52203 Unspecified astigmatism, bilateral: Secondary | ICD-10-CM | POA: Diagnosis not present

## 2024-02-27 DIAGNOSIS — H43813 Vitreous degeneration, bilateral: Secondary | ICD-10-CM | POA: Diagnosis not present

## 2024-02-27 DIAGNOSIS — H26493 Other secondary cataract, bilateral: Secondary | ICD-10-CM | POA: Diagnosis not present

## 2024-03-05 ENCOUNTER — Other Ambulatory Visit: Payer: Self-pay

## 2024-03-05 DIAGNOSIS — M47812 Spondylosis without myelopathy or radiculopathy, cervical region: Secondary | ICD-10-CM | POA: Diagnosis not present

## 2024-03-05 DIAGNOSIS — M545 Low back pain, unspecified: Secondary | ICD-10-CM | POA: Diagnosis not present

## 2024-03-05 DIAGNOSIS — M542 Cervicalgia: Secondary | ICD-10-CM | POA: Diagnosis not present

## 2024-03-05 MED ORDER — LENALIDOMIDE 5 MG PO CAPS: 5.0000 mg | ORAL_CAPSULE | Freq: Every day | ORAL | 0 refills | Status: DC

## 2024-03-06 ENCOUNTER — Other Ambulatory Visit: Payer: Self-pay | Admitting: Hematology

## 2024-03-08 ENCOUNTER — Encounter: Payer: Self-pay | Admitting: Hematology

## 2024-03-10 DIAGNOSIS — M47812 Spondylosis without myelopathy or radiculopathy, cervical region: Secondary | ICD-10-CM | POA: Diagnosis not present

## 2024-03-10 DIAGNOSIS — M25512 Pain in left shoulder: Secondary | ICD-10-CM | POA: Diagnosis not present

## 2024-03-10 DIAGNOSIS — R7303 Prediabetes: Secondary | ICD-10-CM | POA: Diagnosis not present

## 2024-03-10 DIAGNOSIS — M542 Cervicalgia: Secondary | ICD-10-CM | POA: Diagnosis not present

## 2024-03-11 ENCOUNTER — Encounter: Payer: Self-pay | Admitting: Internal Medicine

## 2024-03-11 ENCOUNTER — Other Ambulatory Visit: Payer: Self-pay | Admitting: Internal Medicine

## 2024-03-11 ENCOUNTER — Ambulatory Visit: Payer: Medicare Other | Attending: Internal Medicine | Admitting: Internal Medicine

## 2024-03-11 VITALS — BP 110/60 | HR 45 | Ht 68.0 in | Wt 186.0 lb

## 2024-03-11 DIAGNOSIS — Z8673 Personal history of transient ischemic attack (TIA), and cerebral infarction without residual deficits: Secondary | ICD-10-CM | POA: Diagnosis not present

## 2024-03-11 DIAGNOSIS — I10 Essential (primary) hypertension: Secondary | ICD-10-CM | POA: Diagnosis not present

## 2024-03-11 DIAGNOSIS — I480 Paroxysmal atrial fibrillation: Secondary | ICD-10-CM | POA: Diagnosis not present

## 2024-03-11 DIAGNOSIS — E785 Hyperlipidemia, unspecified: Secondary | ICD-10-CM | POA: Diagnosis not present

## 2024-03-11 MED ORDER — RIVAROXABAN 20 MG PO TABS: 20.0000 mg | ORAL_TABLET | Freq: Every day | ORAL | 1 refills | Status: DC

## 2024-03-11 NOTE — Patient Instructions (Signed)
 Medication Instructions:  NO CHANGES *If you need a refill on your cardiac medications before your next appointment, please call your pharmacy*   Follow-Up: At [facility], you and your health needs are our priority.  As part of our continuing mission to provide you with exceptional heart care, we have created designated Provider Care Teams.  These Care Teams include your primary Cardiologist (physician) and Advanced Practice Providers (APPs -  Physician Assistants and Nurse Practitioners) who all work together to provide you with the care you need, when you need it.  We recommend signing up for the patient portal called "MyChart".  Sign up information is provided on this After Visit Summary.  MyChart is used to connect with patients for Virtual Visits (Telemedicine).  Patients are able to view lab/test results, encounter notes, upcoming appointments, etc.  Non-urgent messages can be sent to your provider as well.   To learn more about what you can do with MyChart, go to [email or web address].    Your next appointment:   12 months with Dr. Rennis Golden  ** call in October or November for a March appointment  Other Instructions   1st Floor: - Lobby - Registration  - Pharmacy  - Lab - Cafe  2nd Floor: - PV Lab - Diagnostic Testing (echo, CT, nuclear med)  3rd Floor: - Vacant  4th Floor: - TCTS (cardiothoracic surgery) - AFib Clinic - Structural Heart Clinic - Vascular Surgery  - Vascular Ultrasound  5th Floor: - [facility] (general and EP) - Clinical Pharmacy for coumadin, hypertension, lipid, weight-loss medications, and med management appointments    Valet parking services will be available as well.

## 2024-03-11 NOTE — Telephone Encounter (Signed)
 Pt is requesting a refill on medication omeprazole. Dr Rennis Golden did not prescribe this medication. Would Dr. Rennis Golden like to refill this medication? Please address

## 2024-03-11 NOTE — Progress Notes (Signed)
 OFFICE NOTE  Chief Complaint:  Follow-up  Primary Care Physician: Lupita Raider, MD  HPI:  Justin [name] is a pleasant 83 year old male who I met recently in the hospital. He presented with chest pain that he developed while sitting his computer. The symptoms had some typical and atypical features for angina. He ruled out for MI and underwent nuclear stress testing which was negative for ischemia. During the stress test while exercising he developed an SVT with heart rate that shot up abruptly to 220. This responded to vagal maneuvers and then reoccurred and eventually subsided. He was started on low-dose beta blocker and eventually discharged without any recurrent symptoms. He was placed on a monitor and has follow-up with cardiac electrophysiology to see Dr. Ladona Ridgel in January. 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 He was started on Lipitor which was started the hospital for for coronary disease, however his LDL is only 95. He is a diabetic. His metformin was increased to 500 twice a day which is improved his blood sugars. He recently saw his primary care provider felt that he was doing well.  I saw Justin [name] back in the office today. He was referred to Dr. Lewayne Bunting for SVT ablation consideration, and this was offered to him. Prior to having this scheduled he developed an episode of chest pain again and that brought him to the hospital yesterday. He was evaluated by Dr. Dietrich Pates, and had a right upper quadrant ultrasound which was unremarkable. He also had coronary CT angiogram which demonstrated mild to moderate coronary disease and an intermediate coronary calcium score of 202. He has subsequently worn a monitor between 12/21/2015 and 01/18/2015 which showed no recurrent SVT and normal sinus rhythm.  The etiology of his pain episodes is unclear however may be related to  reflux.   Justin [name] returns today for follow-up. He reports he is done very well without any recurrent SVT status post catheter ablation by Dr. Lewayne Bunting. He is seeing me primarily for risk factor modification and treatment of asymptomatic coronary artery disease which was seen by coronary artery CT angiography. He was found to have 2 areas of mild coronary artery disease and a calcium score which was moderately elevated at 202. Since then he's been on lipid therapy with Lipitor and takes daily aspirin as well as my card is HCTZ for hypertension. Blood pressure is well-controlled today 106/60. He is due for repeat lipid profile and will need a refill on his cholesterol medication. He denies any chest pain or shortness of breath. 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  06/06/2016  Justin [name] returns today for follow-up. He was seen by myself in the hospital after he presented with symptoms of possible TIA. This is following recent hospitalization at [facility] [place] in [place] where he had a stroke. MRI confirmed this although he has had no significant lasting deficit. His initial symptoms were left arm heaviness and numbness. He also subsequently had some right arm tingling but the symptoms were different than his original presentation. This was thought to be TIA. He was evaluated by neurology however repeat imaging failed to show any new deficits. Subsequently he was referred for a TEE which I performed. This demonstrated a small bidirectional PFO. He had a bifid left atrial appendage without any  thrombus. While he did have PFO, it's not clear that this was the etiology of his stroke or TIA. Venous Dopplers of the legs were negative for thrombus. He was changed from aspirin to Plavix. We also recommended and implanted loop recorder which was placed by Dr. Johney Frame. The thought is that he may be having intermittent atrial fibrillation, as he has a history of  SVT underwent ablation.  06/12/2017  Justin [name] returns today for follow-up. He's had a very eventful year. 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 He's had problems with recurrent skin cancer. He has had no further stroke or TIA and has done well on aspirin and Plavix. Recently his neurologist he discontinued Plavix and is currently only on full dose aspirin. He did have an implanted loop recorder and multiple interrogations have failed to show any recurrent or intermittent atrial fibrillation. He denies any recurrent SVT. This is not been noted as well. Blood pressure is well controlled and he said recent weight loss which is appropriate at this point.  10/01/2018  Justin [name] is seen today in follow-up.  Is been over a year since I saw him.  Unfortunately was recently admitted with precordial chest pain last week.  He ruled out for MI and an outpatient echocardiogram was recommended.  Demonstrated LVEF 55 to 60%, mild LVH and grade 1 diastolic dysfunction.  No regional wall motion abnormalities.  Then he reports no recurrent chest pain.  He continues however to have some physical decline.  He was noted to be using a walker today which she did not last year.  Partially may be related to stroke, however previously he was flying airplanes 2 years ago.  Denies recurrent palpitations.  Loop recorder interrogation is failed to show recurrent arrhythmia.  11/04/2018  Justin [name] returns today for follow-up.  He underwent CT coronary angiography which showed no obstructive coronary disease and a mild increase in coronary calcium to 229 compared to study in 2016.  I do suspect his chest pain is noncardiac.  He actually now saying that he is having more belching and gas and discomfort after eating which sounds GI in nature.  He does not have a current gastroenterologist.  12/30/2019  Justin [name] returns today for annual follow-up.  Earlier this morning he had a virtual visit with Dr. Johney Frame.   This was for follow-up of his loop recorder which is now at end of battery life.  Ultimately he will want it explanted.  He did not show any further events.  He had brief episodes of A. fib and has been anticoagulated for that but has not had recent recurrence.  Nonetheless since we will be monitoring him forward, I would recommend he remain on lifelong anticoagulation with Xarelto.  Overall he says he is doing much better.  He says he is in remission with regards to his multiple myeloma.  Cholesterols been well controlled with recent lipid profile showing total cholesterol 126, triglycerides 154, HDL 66 and LDL of 63.  He is interested in possibly getting back to flying and might consider the basic med program.  01/08/2022  Justin [name] returns today for follow-up.  Overall he seems to be doing well but has a number of questions today.  He stopped taking a number of vitamins recently.  He had some concerns about his implanted loop recorder which has not been removed.  At this point the battery life is expired.  He said it was only noted that he had 1 episode  of A. fib which was short in duration but therefore was recommended to have lifelong anticoagulation on Xarelto.  He seems to be tolerating that well.  He has had no recurrent arrhythmias.  He did have 1 episode of what sounded like anginal chest pain.  He took nitroglycerin with some relief and then called EMS.  On arrival EKG was normal.  He was given the option to go to the ER but he declined.  He has not had any further symptoms.  He had lab work last week from his primary care provider which showed total cholesterol 140, HDL 74, triglycerides 80 and LDL 50 on low-dose atorvastatin.   01/13/2023  Justin [name] returns today for follow-up.  Overall he says he is doing pretty well.  He reports that he is been having some issues with some leg cramps and occasionally in his hands.  He thinks this might be related to the atorvastatin he is on which is a low-dose at 10 mg.   It is actually quite effective as his cholesterols been low.  In November total was 174, HDL 79, triglycerides 144 and LDL of 50.  He does have coronary disease however based on coronary calcification which was seen on CT in the past.  Given history of prior stroke his target LDL is less than 70.  He denies any recurrent palpitations.  He is noted to be bradycardic today but has been asymptomatic with this.  He is not on any AV nodal blocking medications.  03/11/2024  Justin [name] returns today for follow-up.  Overall he says he is feeling pretty well.  He denies any chest pain or worsening shortness of breath.  He continues to have bradycardia not on any AV nodal blocking agents.  He notes his heart rate does go up with exertion.  He had a implanted loop recorder for several years which showed no significant findings.  He has not had any recent atrial fibrillation.  He remains on Xarelto.  He previously was having some cramping issues and I stopped his statin he is now on low-dose pravastatin but still has excellent cholesterol with an LDL 47.  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 Ladona Ridgel   BASAL CELL CARCINOMA EXCISION     off of back   ELECTROPHYSIOLOGIC STUDY N/A 05/01/2015   Procedure: SVT Ablation;  Surgeon: Marinus Maw, MD;  Location: [facility] INVASIVE CV LAB;  Service: Cardiovascular;  Laterality: N/A;   EP IMPLANTABLE DEVICE N/A 06/04/2016   Procedure: Loop Recorder Insertion;  Surgeon: Hillis Range, MD;  Location: [facility] INVASIVE CV LAB;  Service: Cardiovascular;  Laterality: N/A;   implantable loop recorder removal     ILR removed by Dr Johney Frame   INGUINAL HERNIA REPAIR Bilateral 06/16/2017   Procedure: LAPAROSCOPIC BILATERAL INGUINAL HERNIA REPAIR;  Surgeon: Berna Bue, MD;  Location: [facility] OR;  Service: General;  Laterality: Bilateral;   INSERTION OF MESH Bilateral 06/16/2017   Procedure: INSERTION OF MESH;  Surgeon: Berna Bue, MD;  Location: [facility] OR;  Service: General;  Laterality: Bilateral;   MASS EXCISION Right 07/28/2018   Procedure: EXCISION OF RIGHT FOREARM MASS;  Surgeon: Berna Bue, MD;  Location: [facility] ORS;  Service: General;  Laterality: Right;   SQUAMOUS CELL CARCINOMA EXCISION     TEE WITHOUT CARDIOVERSION N/A 06/04/2016   Procedure: TRANSESOPHAGEAL ECHOCARDIOGRAM (TEE);  Surgeon: Chrystie Nose, MD;  Location: [facility] ENDOSCOPY;  Service: Cardiovascular;  Laterality: N/A;    FAMHx:  Family History  Problem Relation Age of Onset   Stroke Mother    Hypertension Mother    Language disorder Mother        Aphasia; unknown cause   Alzheimer's disease Father     Heart failure Father    Dementia Sister        Lewy body   Down syndrome Daughter     SOCHx:   reports that he has never smoked. He has never used smokeless tobacco. He reports that he does not drink alcohol and does not use drugs.  ALLERGIES:  Allergies  Allergen Reactions   Other Other (See Comments)   No Known Allergies     ROS: Pertinent items noted in HPI and remainder of comprehensive ROS otherwise negative.  HOME MEDS: Current Outpatient Medications  Medication Sig Dispense Refill   B Complex-C (SUPER B-COMPLEX + VITAMIN C PO) Take 1 capsule by mouth daily with supper.     cyanocobalamin 1000 MCG tablet Take 1,000 mcg by mouth daily.     denosumab (PROLIA) 60 MG/ML SOSY injection Inject 60 mg into the skin every 6 (six) months.     FREESTYLE LITE test strip      glucose monitoring kit (FREESTYLE) monitoring kit 1 each by Does not apply route as needed for other.     Lancets (FREESTYLE) lancets 1 each daily.     lenalidomide (REVLIMID) 5 MG capsule Take 1 capsule (5 mg total) by mouth daily. Take 1 capsule (5 mg total) daily for 21 days and then take 7 days off. Repeat. 21 capsule 0   Magnesium 400 MG CAPS Take 400 mg by mouth daily.     memantine (NAMENDA) 5 MG tablet Take 1 tablet (5 mg total) by mouth at bedtime. 30 tablet 11   metFORMIN (GLUCOPHAGE) 1000 MG tablet TAKE ONE-HALF (1/2) TABLET DAILY WITH BREAKFAST 90 tablet 3   nitroGLYCERIN (NITROSTAT) 0.4 MG SL tablet Place 1 tablet (0.4 mg total) under the tongue every 5 (five) minutes as needed for chest pain (Max 3 doses in 15 mins.). 30 tablet 0   omeprazole (PRILOSEC) 20 MG capsule Take 20 mg by mouth every morning.  Potassium Gluconate 80 MG TABS as directed Orally     pravastatin (PRAVACHOL) 10 MG tablet TAKE 1 TABLET(10 MG) BY MOUTH DAILY 90 tablet 0   pregabalin (LYRICA) 75 MG capsule Take 75 mg by mouth 2 (two) times daily.     rivaroxaban (XARELTO) 20 MG TABS tablet Take 1 tablet (20 mg total) by mouth  daily with supper. 90 tablet 0   Vitamin D, Ergocalciferol, (DRISDOL) 1.25 MG (50000 UNIT) CAPS capsule TAKE 1 CAPSULE BY MOUTH 1 TIME A WEEK 12 capsule 5   Zinc 50 MG TABS Take by mouth.     lenalidomide (REVLIMID) 5 MG capsule TAKE 1 CAPSULE DAILY FOR 21 DAYS ON THEN 7 DAYS OFF AND REPEAT EVERY 28 DAYS (Patient not taking: Reported on 03/11/2024) 21 capsule 0   lenalidomide (REVLIMID) 5 MG capsule TAKE 1 CAPSULE DAILY FOR 21 DAYS ON, THEN 7 DAYS OFF. REPEAT EVERY 28 DAYS (Patient not taking: Reported on 03/11/2024) 21 capsule 0   Turmeric (QC TUMERIC COMPLEX PO) Take 1 capsule by mouth daily. (Patient not taking: Reported on 03/11/2024)     No current facility-administered medications for this visit.    LABS/IMAGING: No results found for this or any previous visit (from the past 48 hours). No results found.  VITALS: BP 110/60   Pulse (!) 45   Ht 5\' 8"  (1.727 m)   Wt 186 lb (84.4 kg)   SpO2 94%   BMI 28.28 kg/m   EXAM: General appearance: alert and no distress Neck: no carotid bruit, no JVD, and thyroid not enlarged, symmetric, no tenderness/mass/nodules Lungs: clear to auscultation bilaterally Heart: regular rate and rhythm Abdomen: soft, non-tender; bowel sounds normal; no masses,  no organomegaly Extremities: extremities normal, atraumatic, no cyanosis or edema Pulses: 2+ and symmetric Skin: Skin color, texture, turgor normal. No rashes or lesions Neurologic: Grossly normal  EKG: EKG Interpretation Date/Time:  Thursday March 11 2024 11:48:38 EDT Ventricular Rate:  45 PR Interval:  170 QRS Duration:  76 QT Interval:  440 QTC Calculation: 380 R Axis:   -9  Text Interpretation: Sinus bradycardia When compared with ECG of 10-Oct-2023 16:35, No significant change since last tracing Confirmed by Zoila Shutter [phone]) on 03/11/2024 12:02:56 PM    ASSESSMENT: Chest pain/[name] angina -nonobstructive coronary artery disease by cardiac CT, calcium score 229  (09/2018) Stroke/TIA PAF - brief, found on ILR - on Xarelto Small bidirectional PFO by TEE -on long-term Xarelto Mild CAD - coronary CT angiogram showed mild to moderate coronary disease which was nonobstructive and an intermediate coronary calcium score of 202 (2016) PSVT s/p ablation by Dr. Ladona Ridgel Diabetes type 2 -controlled Relative dyslipidemia Multiple myeloma -in remission Skin cancer  PLAN: 1.   Justin [name] he is doing well without chest pain or worsening shortness of breath.  He remains on Xarelto which is lifelong for brief episode of atrial fibrillation and prior stroke/TIA.  He had SVT ablation but has not had any reoccurrence.  He has bradycardia but not on AV nodal blocking agents.  He reports his heart rate does increase with exercise.  Cholesterol is well-controlled with an LDL in the 40s.  Overall he is doing well.  Follow-up annually or sooner as necessary.  Chrystie Nose, MD, [facility], FACP  [employer]  [facility]  Medical Director of the [facility]  [facility]ovascular Risk Reduction [facility] Diplomate of the American Board of Clinical Lipidology Attending Cardiologist  Direct Dial: [phone]  Fax: [phone]  Website:  [email or web address]  Lisette Abu Caytlin Better 03/11/2024, 12:03 PM

## 2024-03-11 NOTE — Telephone Encounter (Signed)
 Prescription refill request for Xarelto received.  Indication: Afib  Last office visit: 03/11/24 ([place])  Weight: 84.4kg Age: 83 Scr:1.25 (01/12/24)  CrCl: 52.15ml/min  Appropriate dose. Refill sent.

## 2024-03-11 NOTE — Telephone Encounter (Signed)
*  STAT* If patient is at the pharmacy, call can be transferred to refill team.   1. Which medications need to be refilled? (please list name of each medication and dose if known)   rivaroxaban (XARELTO) 20 MG TABS tablet  omeprazole (PRILOSEC) 20 MG capsule  2. Which pharmacy/location (including street and city if local pharmacy) is medication to be sent to? [id number] [place], [place] - [address] AT [facility] [address] Phone: [phone]  Fax: [phone]     3. Do they need a 30 day or 90 day supply? 90  Pt is out medications

## 2024-03-12 MED ORDER — OMEPRAZOLE 20 MG PO CPDR: 20.0000 mg | DELAYED_RELEASE_CAPSULE | ORAL | 3 refills | Status: AC

## 2024-03-15 DIAGNOSIS — M25512 Pain in left shoulder: Secondary | ICD-10-CM | POA: Diagnosis not present

## 2024-03-15 DIAGNOSIS — M542 Cervicalgia: Secondary | ICD-10-CM | POA: Diagnosis not present

## 2024-03-22 DIAGNOSIS — M542 Cervicalgia: Secondary | ICD-10-CM | POA: Diagnosis not present

## 2024-03-22 DIAGNOSIS — M25512 Pain in left shoulder: Secondary | ICD-10-CM | POA: Diagnosis not present

## 2024-04-05 ENCOUNTER — Other Ambulatory Visit: Payer: Self-pay

## 2024-04-05 MED ORDER — LENALIDOMIDE 5 MG PO CAPS: 5.0000 mg | ORAL_CAPSULE | Freq: Every day | ORAL | 0 refills | Status: DC

## 2024-04-14 ENCOUNTER — Other Ambulatory Visit: Payer: Self-pay

## 2024-04-14 ENCOUNTER — Other Ambulatory Visit: Payer: Medicare Other

## 2024-04-14 DIAGNOSIS — C9000 Multiple myeloma not having achieved remission: Secondary | ICD-10-CM

## 2024-04-15 ENCOUNTER — Inpatient Hospital Stay: Attending: Hematology

## 2024-04-15 DIAGNOSIS — C9000 Multiple myeloma not having achieved remission: Secondary | ICD-10-CM | POA: Insufficient documentation

## 2024-04-15 DIAGNOSIS — D649 Anemia, unspecified: Secondary | ICD-10-CM | POA: Diagnosis not present

## 2024-04-15 DIAGNOSIS — Z7901 Long term (current) use of anticoagulants: Secondary | ICD-10-CM | POA: Diagnosis not present

## 2024-04-15 DIAGNOSIS — Z85828 Personal history of other malignant neoplasm of skin: Secondary | ICD-10-CM | POA: Insufficient documentation

## 2024-04-15 DIAGNOSIS — M810 Age-related osteoporosis without current pathological fracture: Secondary | ICD-10-CM | POA: Diagnosis not present

## 2024-04-15 DIAGNOSIS — D696 Thrombocytopenia, unspecified: Secondary | ICD-10-CM | POA: Insufficient documentation

## 2024-04-15 DIAGNOSIS — G629 Polyneuropathy, unspecified: Secondary | ICD-10-CM | POA: Insufficient documentation

## 2024-04-15 DIAGNOSIS — I480 Paroxysmal atrial fibrillation: Secondary | ICD-10-CM | POA: Insufficient documentation

## 2024-04-15 DIAGNOSIS — Z8673 Personal history of transient ischemic attack (TIA), and cerebral infarction without residual deficits: Secondary | ICD-10-CM | POA: Diagnosis not present

## 2024-04-15 DIAGNOSIS — E538 Deficiency of other specified B group vitamins: Secondary | ICD-10-CM | POA: Insufficient documentation

## 2024-04-15 LAB — CBC WITH DIFFERENTIAL (CANCER CENTER ONLY)
Abs Immature Granulocytes: 0.01 10*3/uL (ref 0.00–0.07)
Basophils Absolute: 0.1 10*3/uL (ref 0.0–0.1)
Basophils Relative: 3 %
Eosinophils Absolute: 0.1 10*3/uL (ref 0.0–0.5)
Eosinophils Relative: 4 %
HCT: 35.5 % — ABNORMAL LOW (ref 39.0–52.0)
Hemoglobin: 12.2 g/dL — ABNORMAL LOW (ref 13.0–17.0)
Immature Granulocytes: 0 %
Lymphocytes Relative: 31 %
Lymphs Abs: 0.9 10*3/uL (ref 0.7–4.0)
MCH: 30.7 pg (ref 26.0–34.0)
MCHC: 34.4 g/dL (ref 30.0–36.0)
MCV: 89.2 fL (ref 80.0–100.0)
Monocytes Absolute: 0.3 10*3/uL (ref 0.1–1.0)
Monocytes Relative: 10 %
Neutro Abs: 1.5 10*3/uL — ABNORMAL LOW (ref 1.7–7.7)
Neutrophils Relative %: 52 %
Platelet Count: 119 10*3/uL — ABNORMAL LOW (ref 150–400)
RBC: 3.98 MIL/uL — ABNORMAL LOW (ref 4.22–5.81)
RDW: 14.6 % (ref 11.5–15.5)
WBC Count: 2.8 10*3/uL — ABNORMAL LOW (ref 4.0–10.5)
nRBC: 0 % (ref 0.0–0.2)

## 2024-04-15 LAB — CMP (CANCER CENTER ONLY)
ALT: 8 U/L (ref 0–44)
AST: 16 U/L (ref 15–41)
Albumin: 3.9 g/dL (ref 3.5–5.0)
Alkaline Phosphatase: 55 U/L (ref 38–126)
Anion gap: 4 — ABNORMAL LOW (ref 5–15)
BUN: 20 mg/dL (ref 8–23)
CO2: 27 mmol/L (ref 22–32)
Calcium: 8.7 mg/dL — ABNORMAL LOW (ref 8.9–10.3)
Chloride: 110 mmol/L (ref 98–111)
Creatinine: 1.12 mg/dL (ref 0.61–1.24)
GFR, Estimated: >60 mL/min (ref >60)
Glucose, Bld: 122 mg/dL — ABNORMAL HIGH (ref 70–99)
Potassium: 3.8 mmol/L (ref 3.5–5.1)
Sodium: 141 mmol/L (ref 135–145)
Total Bilirubin: 0.8 mg/dL (ref 0.0–1.2)
Total Protein: 6.4 g/dL — ABNORMAL LOW (ref 6.5–8.1)

## 2024-04-16 LAB — KAPPA/LAMBDA LIGHT CHAINS
Kappa free light chain: 24.5 mg/L — ABNORMAL HIGH (ref 3.3–19.4)
Kappa, lambda light chain ratio: 0.9 (ref 0.26–1.65)
Lambda free light chains: 27.3 mg/L — ABNORMAL HIGH (ref 5.7–26.3)

## 2024-04-19 LAB — MULTIPLE MYELOMA PANEL, SERUM
Albumin SerPl Elph-Mcnc: 3.4 g/dL (ref 2.9–4.4)
Albumin/Glob SerPl: 1.4 (ref 0.7–1.7)
Alpha 1: 0.2 g/dL (ref 0.0–0.4)
Alpha2 Glob SerPl Elph-Mcnc: 0.6 g/dL (ref 0.4–1.0)
B-Globulin SerPl Elph-Mcnc: 0.9 g/dL (ref 0.7–1.3)
Gamma Glob SerPl Elph-Mcnc: 0.8 g/dL (ref 0.4–1.8)
Globulin, Total: 2.5 g/dL (ref 2.2–3.9)
IgA: 102 mg/dL (ref 61–437)
IgG (Immunoglobin G), Serum: 954 mg/dL (ref 603–1613)
IgM (Immunoglobulin M), Srm: 20 mg/dL (ref 15–143)
M Protein SerPl Elph-Mcnc: 0.2 g/dL — ABNORMAL HIGH
Total Protein ELP: 5.9 g/dL — ABNORMAL LOW (ref 6.0–8.5)

## 2024-04-21 ENCOUNTER — Other Ambulatory Visit: Payer: Self-pay

## 2024-04-21 ENCOUNTER — Telehealth: Payer: Self-pay

## 2024-04-21 ENCOUNTER — Other Ambulatory Visit (HOSPITAL_COMMUNITY): Payer: Self-pay

## 2024-04-21 ENCOUNTER — Inpatient Hospital Stay (HOSPITAL_BASED_OUTPATIENT_CLINIC_OR_DEPARTMENT_OTHER): Payer: Medicare Other | Admitting: Hematology

## 2024-04-21 ENCOUNTER — Encounter: Payer: Self-pay | Admitting: Hematology

## 2024-04-21 VITALS — BP 132/73 | HR 47 | Temp 98.0°F | Resp 16 | Ht 68.0 in | Wt 184.9 lb

## 2024-04-21 DIAGNOSIS — D696 Thrombocytopenia, unspecified: Secondary | ICD-10-CM | POA: Diagnosis not present

## 2024-04-21 DIAGNOSIS — C9000 Multiple myeloma not having achieved remission: Secondary | ICD-10-CM

## 2024-04-21 DIAGNOSIS — E538 Deficiency of other specified B group vitamins: Secondary | ICD-10-CM | POA: Diagnosis not present

## 2024-04-21 DIAGNOSIS — I480 Paroxysmal atrial fibrillation: Secondary | ICD-10-CM | POA: Diagnosis not present

## 2024-04-21 DIAGNOSIS — M25512 Pain in left shoulder: Secondary | ICD-10-CM | POA: Diagnosis not present

## 2024-04-21 DIAGNOSIS — D649 Anemia, unspecified: Secondary | ICD-10-CM | POA: Diagnosis not present

## 2024-04-21 DIAGNOSIS — M542 Cervicalgia: Secondary | ICD-10-CM | POA: Diagnosis not present

## 2024-04-21 DIAGNOSIS — M810 Age-related osteoporosis without current pathological fracture: Secondary | ICD-10-CM | POA: Diagnosis not present

## 2024-04-21 NOTE — Progress Notes (Signed)
 HEMATOLOGY/ONCOLOGY CLINIC VISIT NOTE  Date of Service: 04/21/24   Patient Care Team: Glena Landau, MD as PCP - General (Family Medicine) Maximo Spar Aviva Lemmings, MD as PCP - Cardiology (Cardiology)   CHIEF COMPLAINTS:  Follow-up for continued evaluation and management of multiple myeloma  HISTORY OF PRESENTING ILLNESS:  Please see previous note for details of initial presentation  CURRENT THERAPY:   Revlimid  maintenance 5 mg po 3 weeks on 1 week off.   INTERVAL HISTORY:  Justin [name] is a 83 y.o. male who is here for continued evaluation and management of his multiple myeloma.  I last connected with patient on 01/19/2024 via telemedicine visit and complained of cold sores inside his mouth, congestion, and cough with phlegm.  Today, he reports that he is recovering from a mild cold infection, which he has endorsed for the last week. He notes feeling well during the 3 weeks he was in [place]  recently. However, he believes he became sick after being in a crowded airplane on his way home with no mask. He denies any fever. Patient did endorse heavy rhinorrhea initially, which has become more mild. He also reports mild sneezing and mild coughing. Patient has not had swab testing. Patient has been taking Nyquil/Dayquil to manage symptoms.  He has been tolerating his Revlimid  well without any new or severe toxicities.   Patient reports that he recently had some insurance issues which did not allow him to order Revlimid  from his pharmacy. He currently has two tablets of Revlimid  remaining. Patient reports that he has not applied for VA benefits   He complains of memory issues.   He notes that he has been retired for 20 years.   MEDICAL HISTORY:  Past Medical History:  Diagnosis Date   Acute blood loss anemia    Arthritis    Atherosclerosis of native coronary artery of native heart without angina pectoris 03/20/2016   Benign essential hypertension    Chest pain 09/27/2018    Controlled type 2 diabetes mellitus with complication, without long-term current use of insulin  12/07/2014   Degeneration of lumbar intervertebral disc 01/30/2018   Gait abnormality    GERD (gastroesophageal reflux disease)    Gout    History of loop recorder    History of squamous cell carcinoma    Hyperlipidemia 12/13/2014   IPMN (intraductal papillary mucinous neoplasm) 02/20/2022   Left leg weakness    Low back pain 01/30/2018   Lumbar radiculopathy 01/30/2018   Mild cognitive impairment of uncertain or unknown etiology 07/16/2023   Multiple lacunar infarcts 2017   Bilateral basal ganglia; right frontal white matter     Multiple myeloma without remission 03/10/2019   Numbness 06/01/2016   Osteoporosis 03/10/2019   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   Paroxysmal SVT (supraventricular tachycardia) (HCC)    a. 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 Carolynne Citron   BASAL CELL CARCINOMA EXCISION     off of back   ELECTROPHYSIOLOGIC STUDY N/A 05/01/2015   Procedure: SVT Ablation;  Surgeon: Tammie Fall, MD;  Location: [facility] INVASIVE CV LAB;  Service: Cardiovascular;  Laterality: N/A;   EP IMPLANTABLE DEVICE N/A 06/04/2016   Procedure: Loop Recorder Insertion;  Surgeon: Jolly Needle, MD;  Location: [facility] INVASIVE CV LAB;  Service: Cardiovascular;  Laterality: N/A;   implantable loop recorder removal     ILR removed by Dr Nunzio Belch   INGUINAL HERNIA REPAIR Bilateral 06/16/2017   Procedure: LAPAROSCOPIC BILATERAL INGUINAL HERNIA REPAIR;  Surgeon: Adalberto Acton, MD;  Location: [facility] OR;  Service: General;  Laterality: Bilateral;   INSERTION OF MESH Bilateral 06/16/2017   Procedure: INSERTION OF MESH;  Surgeon: Adalberto Acton, MD;   Location: [facility] OR;  Service: General;  Laterality: Bilateral;   MASS EXCISION Right 07/28/2018   Procedure: EXCISION OF RIGHT FOREARM MASS;  Surgeon: Adalberto Acton, MD;  Location: [facility] ORS;  Service: General;  Laterality: Right;   SQUAMOUS CELL CARCINOMA EXCISION     TEE WITHOUT CARDIOVERSION N/A 06/04/2016   Procedure: TRANSESOPHAGEAL ECHOCARDIOGRAM (TEE);  Surgeon: Hazle Lites, MD;  Location: [facility] ENDOSCOPY;  Service: Cardiovascular;  Laterality: N/A;    SOCIAL HISTORY: Social History   Socioeconomic History   Marital status: Married    Spouse name: Not on file   Number of children: 3   Years of education: 17   Highest education level: Bachelor's degree (e.g., BA, AB, BS)  Occupational History   Occupation: Retired    Comment: Art gallery manager  Tobacco Use   Smoking status: Never   Smokeless tobacco: Never  Vaping Use   Vaping status: Never Used  Substance and Sexual Activity   Alcohol use: No   Drug use: No   Sexual activity: Not on file  Other Topics Concern   Not on file  Social History Narrative   Right handed   No caffeine   Two story home   Retired   Surveyor, quantity   Social Drivers of Corporate investment banker Strain: Not on file  Food Insecurity: Not on file  Transportation Needs: Not on file  Physical Activity: Not on file  Stress: Not on file  Social Connections: Not on file  Intimate Partner Violence: Not on file   Clellan Ganguly is retired. His daughter is Interior and spatial designer of nursing for a nursing home in [place], [place].    FAMILY HISTORY: Family History  Problem Relation Age of Onset   Stroke Mother    Hypertension Mother    Language disorder Mother        Aphasia; unknown cause   Alzheimer's disease Father    Heart failure Father    Dementia Sister        Lewy body   Down syndrome Daughter     ALLERGIES:  is allergic to other and no known allergies.  MEDICATIONS:  Current Outpatient Medications  Medication Sig Dispense Refill   B Complex-C (SUPER  B-COMPLEX + VITAMIN C PO) Take 1 capsule by mouth daily with supper.     cyanocobalamin  1000 MCG tablet Take 1,000 mcg by mouth daily.     denosumab  (PROLIA ) 60 MG/ML SOSY injection Inject 60 mg into the skin every 6 (six) months.     FREESTYLE LITE test strip      glucose monitoring kit (FREESTYLE) monitoring kit 1 each by Does not apply route as needed for other.     Lancets (FREESTYLE) lancets 1 each daily.     lenalidomide  (REVLIMID ) 5 MG capsule TAKE 1 CAPSULE DAILY FOR 21 DAYS ON THEN 7 DAYS OFF AND REPEAT EVERY 28 DAYS (Patient not taking: Reported on 03/11/2024) 21 capsule 0   lenalidomide  (REVLIMID ) 5 MG capsule TAKE 1 CAPSULE DAILY FOR 21 DAYS ON, THEN 7 DAYS OFF. REPEAT  EVERY 28 DAYS (Patient not taking: Reported on 03/11/2024) 21 capsule 0   lenalidomide  (REVLIMID ) 5 MG capsule Take 1 capsule (5 mg total) by mouth daily. Take 1 capsule (5 mg total) daily for 21 days and then take 7 days off. Repeat. 21 capsule 0   Magnesium 400 MG CAPS Take 400 mg by mouth daily.     memantine  (NAMENDA ) 5 MG tablet Take 1 tablet (5 mg total) by mouth at bedtime. 30 tablet 11   metFORMIN  (GLUCOPHAGE ) 1000 MG tablet TAKE ONE-HALF (1/2) TABLET DAILY WITH BREAKFAST 90 tablet 3   nitroGLYCERIN  (NITROSTAT ) 0.4 MG SL tablet Place 1 tablet (0.4 mg total) under the tongue every 5 (five) minutes as needed for chest pain (Max 3 doses in 15 mins.). 30 tablet 0   omeprazole  (PRILOSEC) 20 MG capsule Take 1 capsule (20 mg total) by mouth every morning. 90 capsule 3   Potassium Gluconate 80 MG TABS as directed Orally     pravastatin  (PRAVACHOL ) 10 MG tablet TAKE 1 TABLET(10 MG) BY MOUTH DAILY 90 tablet 0   pregabalin  (LYRICA ) 75 MG capsule Take 75 mg by mouth 2 (two) times daily.     rivaroxaban  (XARELTO ) 20 MG TABS tablet Take 1 tablet (20 mg total) by mouth daily with supper. 90 tablet 1   Turmeric (QC TUMERIC COMPLEX PO) Take 1 capsule by mouth daily. (Patient not taking: Reported on 03/11/2024)     Vitamin D ,  Ergocalciferol , (DRISDOL ) 1.25 MG (50000 UNIT) CAPS capsule TAKE 1 CAPSULE BY MOUTH 1 TIME A WEEK 12 capsule 5   Zinc 50 MG TABS Take by mouth.     No current facility-administered medications for this visit.    REVIEW OF SYSTEMS:    10 Point review of Systems was done is negative except as noted above.   PHYSICAL EXAMINATION:  .BP 132/73 (BP Location: Left Arm, Patient Position: Sitting)   Pulse (!) 47 Comment: Nurse notified  Temp 98 F (36.7 C) (Oral)   Resp 16   Ht 5\' 8"  (1.727 m)   Wt 184 lb 14.4 oz (83.9 kg)   SpO2 100%   BMI 28.11 kg/m   GENERAL:alert, in no acute distress and comfortable SKIN: no acute rashes, no significant lesions EYES: conjunctiva are pink and non-injected, sclera anicteric OROPHARYNX: MMM, no exudates, no oropharyngeal erythema or ulceration NECK: supple, no JVD LYMPH:  no palpable lymphadenopathy in the cervical, axillary or inguinal regions LUNGS: clear to auscultation b/l with normal respiratory effort HEART: regular rate & rhythm ABDOMEN:  normoactive bowel sounds , non tender, not distended. Extremity: no pedal edema PSYCH: alert & oriented x 3 with fluent speech NEURO: no focal motor/sensory deficits   LABORATORY DATA:  I have reviewed the data as listed.     Latest Ref Rng & Units 04/15/2024    1:45 PM 01/12/2024    1:03 PM 10/20/2023    9:04 AM  CBC  WBC 4.0 - 10.5 K/uL 2.8  3.3  2.8   Hemoglobin 13.0 - 17.0 g/dL 16.1  09.6  04.5   Hematocrit 39.0 - 52.0 % 35.5  35.9  39.3   Platelets 150 - 400 K/uL 119  105  92        Latest Ref Rng & Units 04/15/2024    1:45 PM 01/12/2024    1:03 PM 10/20/2023    9:04 AM  CMP  Glucose 70 - 99 mg/dL 409  811  914   BUN 8 - 23 mg/dL 20  20  23   Creatinine 0.61 - 1.24 mg/dL 9.32  3.55  7.32   Sodium 135 - 145 mmol/L 141  137  136   Potassium 3.5 - 5.1 mmol/L 3.8  4.2  4.3   Chloride 98 - 111 mmol/L 110  106  105   CO2 22 - 32 mmol/L 27  25  24    Calcium  8.9 - 10.3 mg/dL 8.7  8.5  8.6    Total Protein 6.5 - 8.1 g/dL 6.4  6.3  6.3   Total Bilirubin 0.0 - 1.2 mg/dL 0.8  1.0  0.9   Alkaline Phos 38 - 126 U/L 55  52  43   AST 15 - 41 U/L 16  19  20    ALT 0 - 44 U/L 8  8  9          RADIOGRAPHIC STUDIES: I have personally reviewed the radiological images as listed and agreed with the findings in the report.       Bone Marrow Biopsy 12/24/2016 (Accession [id number])  Diagnosis Bone Marrow, Aspirate,Biopsy, and Clot, left iliac crest - HYPERCELLULAR BONE MARROW FOR AGE WITH PLASMA CELL NEOPLASM. - SEE COMMENT. PERIPHERAL BLOOD: - NORMOCYTIC-NORMOCHROMIC ANEMIA. - LEUKOPENIA.  DG Bone Survey Met (Accession [id number]) (Order [id number])  Imaging  Date: 11/28/2016 Department: [facility]-RADIOLOGY[facility]STIC Released By: Carolyn Cisco Authorizing: Frankie Israel, MD  Exam Information   Status Exam Begun  Exam Ended   Final 99 11/28/2016 11:59 AM 11/28/2016 12:36 PM  PACS Images   Show images for DG Bone Survey Met  Study Result   CLINICAL DATA:  Monoclonal gammopathy of unknown significance. History of squamous cell carcinoma excision, basal cell carcinoma excision.   EXAM: METASTATIC BONE SURVEY   COMPARISON:  CT neck, chest, abdomen and pelvis from 10/29/16, MRI of the head from 06/01/2016, CXR 10/27/2016, lumbar spine radiographs 06/24/2016   FINDINGS: Lateral skull: Small occipital lucency which may represent a normal arachnoid granulation posteriorly in the occiput. No definite lytic abnormality.   Cervical spine AP and lateral: Disc space narrowing at C2-3, and from C4 through C7. C4-5, C5-6 and C6-7 uncovertebral joint spurring bilaterally. No lytic abnormality.   Thoracic spine AP and lateral: T8-9 right-sided osteophytes and to a lesser degree T9-10. No lytic abnormality. Slight multilevel lumbar thoracic disc space narrowing likely degenerative.   Lumbar spine AP and lateral: Disc space narrowing at L4-5 and to  a greater extent L5-S1. No lytic disease. L3 through S1 facet sclerosis.   AP pelvis: Negative for lytic disease.   Bilateral upper and lower extremities shoulders through wrist and from the hips through ankle: Negative for lytic disease. Joint space narrowing of the femorotibial compartments both knees. Subchondral cyst of the right patella.   CXR: Clear lungs. Cardiac implantable monitoring device projects over the left heart. Aortic atherosclerosis. No lytic disease.   IMPRESSION: No findings suspicious for lytic disease.     Electronically Signed   By: Cathi Cluster M.D.   On: 11/28/2016 14:58     ASSESSMENT & PLAN:   83 y.o. 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 (12/2016) Peak M spike 2.6  Cytogenetics and Myeloma FISH panel.- trisomy 11 Patient has normocytic anemia without any other clear etiology. (>2g/dl lower that lower limit of normal which is 13) which per criteria would place this in the Multiple myeloma category as opposed to Smoldering Multiple  myeloma (Borderline criterion)  No overt renal failure or hypercalcemia at this time No focal bone pains though he has significant chronic back pain related to degenerative disc disease. Bone survey shows no overt lytic lesions.  10/16/18 CT Coronary Morph revealed Coronary artery calcium  score 299 Agatston units, this places the patient in the 47th percentile for age and gender, suggesting intermediate risk for future cardiac events. 2. Nonobstructive coronary disease.  12/08/18 Bone Density study revealed that the pt has osteoporosis  2) Anemia and thrombocytopenia -- related to treatment (Revlimid ) -stable  3) Grade 1 Neuropathy/radiculopathy-- in b/l feet and halfway up lower leg without pain  4) Borderline low B12 levels, previously 299 --- on replacement -  -continue replacement.  5) h/o recurrent CVA - thought to be related to  small vessel disease as per neurology. Has a small PFO which could be an additional risk factor as well as his afib  6)  P afib Plan -Continue on Xarelto  per cardiology.  7) H/o recurrent SCC -Underwent surgical excision on right forearm with Dr. Aldon Hung on 07/28/18. His surgical pathology showed evidence of Squamous Cell Carcinoma. Plan -continue dermatology followup  10) Lower extremity discomfort - improved, stable. Likely Discogenic pain -US  on 12/2017 revealed no blood clot -Pt has established care with orthopedist and PT. - has had muscle cramps from his statins and this is being mx by his cardiologist.  11) Patient Active Problem List   Diagnosis Date Noted   Mild cognitive impairment of uncertain or unknown etiology 07/16/2023   History of squamous cell carcinoma    IPMN (intraductal papillary mucinous neoplasm) 02/20/2022   Osteoporosis 03/10/2019   Multiple myeloma without remission 03/10/2019   Chest pain 09/27/2018   Degeneration of lumbar intervertebral disc 01/30/2018   Low back pain 01/30/2018   Lumbar radiculopathy 01/30/2018   History of loop recorder 06/12/2017   Gait abnormality    Benign essential hypertension    Paroxysmal SVT (supraventricular tachycardia) (HCC)    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   TIA (transient ischemic attack) 06/02/2016   Right arm numbness 06/01/2016   Atherosclerosis of native coronary artery of native heart without angina pectoris 03/20/2016   Multiple lacunar infarcts 2017   GERD (gastroesophageal reflux disease)    Gout    S/P RF ablation operation for arrhythmia 12/13/2014   Hyperlipidemia 12/13/2014   Controlled type 2 diabetes mellitus with complication, without long-term current use of insulin  12/07/2014    PLAN:  -his labs have been fairly stable -Discussed lab results from 04/15/2024 in detail with patient. CBC showed WBC of 2.8K, hemoglobin of 12.2, and platelets of  119K. -hgb stable -WBCs borderline low and not concerning -platelets improved from 105K 3 months ago to 119K more recently -myeloma lab from 04/15/2024 shows M protein of 0.2 g/dL -K/L light chains WNL with normal ratio -CMP shows normal calcium  levels and improved kidney function. There are no other major issues on blood chemistries -recommend trying to connect with VA system to see if he receives any Veteran benefits -Patient has been tolerating his current Revlimid  dosage well without any new or severe toxicities.   -okay to delay treatment by one week to allow him to heal completely. If there is some mild delay in receiving Revlimid  treatment during this time, it would not be concerning -discussed option of swab testing though if his infection is nearly resolved, swab testing results may not effect his treatment  -Recommend to continue  B-Complex and Vitamin-D supplement.  -Continue Prolia .  -answered all of patient's questions in detail  FOLLOW UP: Return to clinic with Dr. Salomon Cree in 12 weeks Labs in 11 weeks PLz schedule next 2 doses of Prolia  every 6 months  The total time spent in the appointment was 23 minutes* .  All of the patient's questions were answered with apparent satisfaction. The patient knows to call the clinic with any problems, questions or concerns.   Jacquelyn Matt MD MS AAHIVMS [facility] Hematology/Oncology Physician [facility]  .*Total Encounter Time as defined by the Centers for Medicare and Medicaid Services includes, in addition to the face-to-face time of a patient visit (documented in the note above) non-face-to-face time: obtaining and reviewing outside history, ordering and reviewing medications, tests or procedures, care coordination (communications with other health care professionals or caregivers) and documentation in the medical record.    I,Mitra Faeizi,acting as a Neurosurgeon for Jacquelyn Matt, MD.,have documented all relevant documentation on  the behalf of Jacquelyn Matt, MD,as directed by  Jacquelyn Matt, MD while in the presence of Jacquelyn Matt, MD.  .I have reviewed the above documentation for accuracy and completeness, and I agree with the above. .Kayani Rapaport Kishore Ermine Spofford MD

## 2024-04-21 NOTE — Telephone Encounter (Signed)
 Oral Oncology Patient Advocate Encounter  Prior Authorization for Revlimid  has been approved.    PA# [id number]  Effective dates: 03/22/24 through 12/22/98  Patient may continue to fill through [facility] as required by insurance plan.    Hansel Ley, CPhT Oncology Pharmacy Patient Advocate  [facility]  [phone] (phone) [phone] (fax) 04/21/2024 2:14 PM

## 2024-04-21 NOTE — Telephone Encounter (Signed)
 Oral Oncology Patient Advocate Encounter  Re-authorization   Received notification that prior authorization for Revlimid  is required.   PA submitted on 04/21/24  Key [id number]  Status is pending     Hansel Ley, CPhT Oncology Pharmacy Patient Advocate  [facility]  [phone] (phone) [phone] (fax) 04/21/2024 2:13 PM

## 2024-04-27 ENCOUNTER — Encounter: Payer: Self-pay | Admitting: Hematology

## 2024-04-27 DIAGNOSIS — M542 Cervicalgia: Secondary | ICD-10-CM | POA: Diagnosis not present

## 2024-04-27 DIAGNOSIS — M25512 Pain in left shoulder: Secondary | ICD-10-CM | POA: Diagnosis not present

## 2024-05-04 DIAGNOSIS — L570 Actinic keratosis: Secondary | ICD-10-CM | POA: Diagnosis not present

## 2024-05-04 DIAGNOSIS — D485 Neoplasm of uncertain behavior of skin: Secondary | ICD-10-CM | POA: Diagnosis not present

## 2024-05-04 DIAGNOSIS — D0439 Carcinoma in situ of skin of other parts of face: Secondary | ICD-10-CM | POA: Diagnosis not present

## 2024-05-05 ENCOUNTER — Inpatient Hospital Stay: Payer: Medicare Other | Attending: Hematology

## 2024-05-05 VITALS — BP 134/75 | HR 51 | Temp 97.7°F | Resp 17

## 2024-05-05 DIAGNOSIS — C9000 Multiple myeloma not having achieved remission: Secondary | ICD-10-CM | POA: Diagnosis not present

## 2024-05-05 DIAGNOSIS — M818 Other osteoporosis without current pathological fracture: Secondary | ICD-10-CM

## 2024-05-05 MED ORDER — DENOSUMAB 60 MG/ML ~~LOC~~ SOSY
60.0000 mg | PREFILLED_SYRINGE | Freq: Once | SUBCUTANEOUS | Status: AC
  Administered 2024-05-05: 60 mg via SUBCUTANEOUS
  Filled 2024-05-05: qty 1

## 2024-05-05 NOTE — Progress Notes (Signed)
 Pt here for Prolia  inj. Ca 8.7 today. Dr. Salomon Cree notified via secure chat. Ok to treat per MD. Pharmacy made aware.

## 2024-05-11 DIAGNOSIS — M25512 Pain in left shoulder: Secondary | ICD-10-CM | POA: Diagnosis not present

## 2024-05-11 DIAGNOSIS — M542 Cervicalgia: Secondary | ICD-10-CM | POA: Diagnosis not present

## 2024-05-13 ENCOUNTER — Other Ambulatory Visit: Payer: Self-pay | Admitting: Hematology

## 2024-05-13 DIAGNOSIS — C9000 Multiple myeloma not having achieved remission: Secondary | ICD-10-CM

## 2024-05-18 ENCOUNTER — Other Ambulatory Visit: Payer: Self-pay | Admitting: Internal Medicine

## 2024-05-18 DIAGNOSIS — I480 Paroxysmal atrial fibrillation: Secondary | ICD-10-CM

## 2024-05-18 NOTE — Telephone Encounter (Signed)
 Prescription refill request for Xarelto  received.  Indication:afib Last office visit:3/25 Weight:83.9  kg Age:83 Scr:1.12  4/25 CrCl:58.26  ml/min  Prescription refilled

## 2024-05-19 ENCOUNTER — Other Ambulatory Visit: Payer: Self-pay

## 2024-05-19 MED ORDER — PRAVASTATIN SODIUM 10 MG PO TABS: 10.0000 mg | ORAL_TABLET | Freq: Every day | ORAL | 3 refills | Status: DC

## 2024-05-20 ENCOUNTER — Telehealth: Payer: Self-pay | Admitting: Pharmacy Technician

## 2024-05-20 NOTE — Telephone Encounter (Signed)
 Oral Oncology Patient Advocate Encounter  Received a fax refill request from [facility] for Pt's Vit D-2 capsules. I indexed it to his media tab in case you need it.

## 2024-05-24 ENCOUNTER — Other Ambulatory Visit: Payer: Self-pay

## 2024-05-24 DIAGNOSIS — C44329 Squamous cell carcinoma of skin of other parts of face: Secondary | ICD-10-CM | POA: Diagnosis not present

## 2024-05-24 DIAGNOSIS — L820 Inflamed seborrheic keratosis: Secondary | ICD-10-CM | POA: Diagnosis not present

## 2024-05-24 MED ORDER — VITAMIN D (ERGOCALCIFEROL) 1.25 MG (50000 UNIT) PO CAPS
50000.0000 [IU] | ORAL_CAPSULE | ORAL | 5 refills | Status: DC
Start: 2024-05-24 — End: 2024-07-01

## 2024-05-25 DIAGNOSIS — M25512 Pain in left shoulder: Secondary | ICD-10-CM | POA: Diagnosis not present

## 2024-05-25 DIAGNOSIS — M542 Cervicalgia: Secondary | ICD-10-CM | POA: Diagnosis not present

## 2024-05-27 DIAGNOSIS — L570 Actinic keratosis: Secondary | ICD-10-CM | POA: Diagnosis not present

## 2024-05-27 DIAGNOSIS — D485 Neoplasm of uncertain behavior of skin: Secondary | ICD-10-CM | POA: Diagnosis not present

## 2024-05-27 DIAGNOSIS — C4442 Squamous cell carcinoma of skin of scalp and neck: Secondary | ICD-10-CM | POA: Diagnosis not present

## 2024-06-01 ENCOUNTER — Other Ambulatory Visit: Payer: Self-pay

## 2024-06-01 MED ORDER — MEMANTINE HCL 5 MG PO TABS: 5.0000 mg | ORAL_TABLET | Freq: Every evening | ORAL | 1 refills | Status: AC

## 2024-06-02 DIAGNOSIS — M542 Cervicalgia: Secondary | ICD-10-CM | POA: Diagnosis not present

## 2024-06-02 DIAGNOSIS — M25512 Pain in left shoulder: Secondary | ICD-10-CM | POA: Diagnosis not present

## 2024-06-08 ENCOUNTER — Other Ambulatory Visit: Payer: Self-pay | Admitting: Hematology

## 2024-06-08 DIAGNOSIS — C9000 Multiple myeloma not having achieved remission: Secondary | ICD-10-CM

## 2024-06-10 ENCOUNTER — Encounter: Payer: Self-pay | Admitting: Physician Assistant

## 2024-06-17 DIAGNOSIS — M542 Cervicalgia: Secondary | ICD-10-CM | POA: Diagnosis not present

## 2024-06-17 DIAGNOSIS — M25512 Pain in left shoulder: Secondary | ICD-10-CM | POA: Diagnosis not present

## 2024-06-24 DIAGNOSIS — L570 Actinic keratosis: Secondary | ICD-10-CM | POA: Diagnosis not present

## 2024-06-24 DIAGNOSIS — M542 Cervicalgia: Secondary | ICD-10-CM | POA: Diagnosis not present

## 2024-06-24 DIAGNOSIS — C4442 Squamous cell carcinoma of skin of scalp and neck: Secondary | ICD-10-CM | POA: Diagnosis not present

## 2024-06-24 DIAGNOSIS — M25512 Pain in left shoulder: Secondary | ICD-10-CM | POA: Diagnosis not present

## 2024-07-01 ENCOUNTER — Other Ambulatory Visit: Payer: Self-pay | Admitting: Hematology

## 2024-07-02 DIAGNOSIS — L309 Dermatitis, unspecified: Secondary | ICD-10-CM | POA: Diagnosis not present

## 2024-07-02 DIAGNOSIS — L0201 Cutaneous abscess of face: Secondary | ICD-10-CM | POA: Diagnosis not present

## 2024-07-06 ENCOUNTER — Other Ambulatory Visit: Payer: Self-pay

## 2024-07-06 DIAGNOSIS — C9000 Multiple myeloma not having achieved remission: Secondary | ICD-10-CM

## 2024-07-06 MED ORDER — LENALIDOMIDE 5 MG PO CAPS: 5.0000 mg | ORAL_CAPSULE | Freq: Every day | ORAL | 0 refills | Status: AC

## 2024-07-07 ENCOUNTER — Inpatient Hospital Stay: Attending: Hematology

## 2024-07-07 DIAGNOSIS — L259 Unspecified contact dermatitis, unspecified cause: Secondary | ICD-10-CM | POA: Diagnosis not present

## 2024-07-07 DIAGNOSIS — L0201 Cutaneous abscess of face: Secondary | ICD-10-CM | POA: Diagnosis not present

## 2024-07-08 DIAGNOSIS — M25512 Pain in left shoulder: Secondary | ICD-10-CM | POA: Diagnosis not present

## 2024-07-08 DIAGNOSIS — L720 Epidermal cyst: Secondary | ICD-10-CM | POA: Diagnosis not present

## 2024-07-08 DIAGNOSIS — M542 Cervicalgia: Secondary | ICD-10-CM | POA: Diagnosis not present

## 2024-07-08 DIAGNOSIS — Z4802 Encounter for removal of sutures: Secondary | ICD-10-CM | POA: Diagnosis not present

## 2024-07-12 ENCOUNTER — Encounter: Payer: Self-pay | Admitting: Physician Assistant

## 2024-07-12 ENCOUNTER — Ambulatory Visit (INDEPENDENT_AMBULATORY_CARE_PROVIDER_SITE_OTHER): Admitting: Physician Assistant

## 2024-07-12 VITALS — BP 120/64 | HR 58 | Resp 20 | Ht 68.0 in | Wt 187.0 lb

## 2024-07-12 DIAGNOSIS — I10 Essential (primary) hypertension: Secondary | ICD-10-CM | POA: Diagnosis not present

## 2024-07-12 DIAGNOSIS — E1159 Type 2 diabetes mellitus with other circulatory complications: Secondary | ICD-10-CM | POA: Diagnosis not present

## 2024-07-12 DIAGNOSIS — I679 Cerebrovascular disease, unspecified: Secondary | ICD-10-CM | POA: Diagnosis not present

## 2024-07-12 DIAGNOSIS — E7800 Pure hypercholesterolemia, unspecified: Secondary | ICD-10-CM | POA: Diagnosis not present

## 2024-07-12 DIAGNOSIS — G3184 Mild cognitive impairment, so stated: Secondary | ICD-10-CM

## 2024-07-12 DIAGNOSIS — I4891 Unspecified atrial fibrillation: Secondary | ICD-10-CM | POA: Diagnosis not present

## 2024-07-12 DIAGNOSIS — C9000 Multiple myeloma not having achieved remission: Secondary | ICD-10-CM | POA: Diagnosis not present

## 2024-07-12 NOTE — Progress Notes (Signed)
 Assessment/Plan:    Mild cognitive impairment   Justin [name] is a very pleasant 83 y.o. RH male retired Occupational hygienist with a history of hypertension, hyperlipidemia, multiple myeloma, history of ADL, DM2, DJD, GERD, gout, B12 deficiency, history of CVA 2017 with bilateral basal ganglia and right frontal white matter lacunar infarcts, history of thrombocytopenia history of parotid mass, L3-L4 spinal canal stenosis followed by orthopedics and a history of MCI of unclear etiology per neuropsych evaluation on 06/2023  presenting today in follow-up for evaluation of memory loss. Patient is on memantine  5 mg nightly (history of bradycardia). Despite subjective complaints his MMSE today is improved at 30/30.  Discussed increasing to bid for better coverage, he agrees to try. .        Recommendations:   Follow up in  6 months. Patient is scheduled for repeat neuropsych evaluation in December 2025 for diagnostic clarity disease trajectory Increase memantine  to 5 mg bid, side effects discussed  Recommend good control of cardiovascular risk factors Continue to control mood as per PCP Monitor driving    Subjective:   This patient is here alone. Previous records as well as any outside records available were reviewed prior to todays visit.   Patient was last seen on 01/29/2024 with MMSE 27/30 .    Any changes in memory since last visit?  A little worse especially with names. Sometimes recent conversation, new information and LTM are affected.  He is not very active, harder to do the things I used to do.  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  I still use the pilot radio. He likes visiting  his family in [place].  He does not enjoy any brain games much. repeats oneself?  Endorsed, to my wife Disoriented when walking into a room?  Patient denies    Misplacing objects?  Endorsed, especially the phone, but not frequently  Wandering behavior?   Denies. Any personality changes  since last visit? Denies.  I am still the same, not very good at disciplining Any worsening depression?: denies.  He does admit to anxiety due to memory loss Hallucinations or paranoia?  Denies.   Seizures?   Denies.    Any sleep changes? Sleep  well for 6 hours.  Denies vivid dreams, REM behavior or sleepwalking   Sleep apnea?   denies    Any hygiene concerns?   Denies.   Independent of bathing and dressing?  Endorsed  Does the patient needs help with medications? Patient is in charge , I may forget a dose Who is in charge of the finances?  Patient is in charge, his wife is more involved lately  as it is becoming harder to me    Any changes in appetite?  denies     Patient have trouble swallowing?  Denies.   Does the patient cook? Yes,  any kitchen accidents such as leaving the stove on? Denies.   Any headaches?    Denies.   Vision changes? Denies. Chronic pain?  He has L3-L4 spinal canal stenosis, followed by orthopedics.  Denies having saddle anesthesia Ambulates with difficulty?    Denies.  He walks frequently, entertaining a cane for stability due to peripheral neuropathy.   Recent falls or head injuries?    Denies.      Unilateral weakness, numbness or tingling?  Denies.  Takes  pregabalin  for B neuropathy  Any tremors?  Denies.   Any anosmia?    Denies.   Any incontinence of urine?  Denies.   Any bowel dysfunction?  Denies.      Patient lives with his wife.  Does the patient drive?  Yes, sometimes I ask myself  where do I go, it concerns me at times    Initial visit 10/11/20203 How long did patient have memory difficulties?  For the last year, worse over the 2 months have more difficulty with names of known people including daughter and grand-daughter, missing appointments, and sometimes he forgets in the middle of the conversation when he was talking about.  He reports that he is STM is worse than LTM. repeats oneself? Endorsed  Disoriented when walking into a room?   Patient denies   Leaving objects in unusual places?  Patient denies   Patient lives with wife and her son  Ambulates  with difficulty?   Feels a little unsteady due to a history of peripheral neuropathy due to diabetes, not recently fell on the rear end without LOC or head injury. Set to participate in PT/OT.  Otherwise, he walks daily and works on the yard.  History of seizures?   Patient denies   Wandering behavior?  Patient denies   Patient drives?   No issues  Any mood changes ?  Sometimes I get upset with myself because I can't remember something and can be frustrating .  Any depression?:  Patient denies   Hallucinations?  Patient denies   Paranoia?  Patient denies   Patient reports that sleeps well without vivid dreams, REM behavior or sleepwalking .   History of sleep apnea?  Patient denies   Any hygiene concerns?  Patient denies   Independent of bathing and dressing?  Endorsed  Does the patient needs help with medications?   Patient in charge  Who is in charge of the finances? Patient  is in charge   Any changes in appetite?  Patient denies   Patient have trouble swallowing? Patient denies   Does the patient cook?  Patient denies   Any kitchen accidents such as leaving the stove on? Patient denies   Any headaches?  Patient denies   Double vision? Patient denies   Any focal numbness or tingling?  Has bilateral diabetic neuropathy and spinal stenosis, he is on Lyrica  tolerating well.  Chronic back pain?  Patient denies   Unilateral weakness?  Patient denies   Any tremors?  Patient denies   Any history of anosmia?  Patient denies   Any incontinence of urine?  Wears pads just in case because I may dribble Any bowel dysfunction?   Intermittent diarrhea or constipation due to Revlimid   History of heavy alcohol intake?  Patient denies   History of heavy tobacco use?  Patient denies   Family history of dementia?   Sister died with LBD     MRI of the lumbar spine with and  without contrast remarkable for moderate L3-L4 spinal canal stenosis due to large disc bulge and moderate facet hypertrophy.  No other spinal canal or neural foramina stenosis.     MRI of the brain 10/10/2023 without evidence of acute cranial abnormality, moderate chronic microvascular ischemic changes, small remote lacunar infarct in the right frontal white matter, no pathologic enhancement.  No pathological atrophy is noted   Past Medical History:  Diagnosis Date   Acute blood loss anemia    Arthritis    Atherosclerosis of native coronary artery of native heart without angina pectoris 03/20/2016   Benign essential hypertension    Chest pain 09/27/2018   Controlled  type 2 diabetes mellitus with complication, without long-term current use of insulin  12/07/2014   Degeneration of lumbar intervertebral disc 01/30/2018   Gait abnormality    GERD (gastroesophageal reflux disease)    Gout    History of loop recorder    History of squamous cell carcinoma    Hyperlipidemia 12/13/2014   IPMN (intraductal papillary mucinous neoplasm) 02/20/2022   Left leg weakness    Low back pain 01/30/2018   Lumbar radiculopathy 01/30/2018   Mild cognitive impairment of uncertain or unknown etiology 07/16/2023   Multiple lacunar infarcts 2017   Bilateral basal ganglia; right frontal white matter     Multiple myeloma without remission 03/10/2019   Numbness 06/01/2016   Osteoporosis 03/10/2019   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   Paroxysmal SVT (supraventricular tachycardia) (HCC)    a. 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   Right arm numbness 06/01/2016   S/P RF ablation operation for arrhythmia 12/13/2014   TIA (transient ischemic attack) 06/02/2016     Past Surgical History:  Procedure Laterality Date   ATRIAL FIBRILLATION ABLATION     Dr. Waddell   BASAL CELL CARCINOMA EXCISION     off of back   ELECTROPHYSIOLOGIC STUDY N/A 05/01/2015   Procedure: SVT Ablation;  Surgeon:  Danelle LELON Waddell, MD;  Location: [facility] INVASIVE CV LAB;  Service: Cardiovascular;  Laterality: N/A;   EP IMPLANTABLE DEVICE N/A 06/04/2016   Procedure: Loop Recorder Insertion;  Surgeon: Lynwood Rakers, MD;  Location: [facility] INVASIVE CV LAB;  Service: Cardiovascular;  Laterality: N/A;   implantable loop recorder removal     ILR removed by Dr Rakers   INGUINAL HERNIA REPAIR Bilateral 06/16/2017   Procedure: LAPAROSCOPIC BILATERAL INGUINAL HERNIA REPAIR;  Surgeon: Signe Mitzie LABOR, MD;  Location: [facility] OR;  Service: General;  Laterality: Bilateral;   INSERTION OF MESH Bilateral 06/16/2017   Procedure: INSERTION OF MESH;  Surgeon: Signe Mitzie LABOR, MD;  Location: [facility] OR;  Service: General;  Laterality: Bilateral;   MASS EXCISION Right 07/28/2018   Procedure: EXCISION OF RIGHT FOREARM MASS;  Surgeon: Signe Mitzie LABOR, MD;  Location: [facility] ORS;  Service: General;  Laterality: Right;   SQUAMOUS CELL CARCINOMA EXCISION     TEE WITHOUT CARDIOVERSION N/A 06/04/2016   Procedure: TRANSESOPHAGEAL ECHOCARDIOGRAM (TEE);  Surgeon: Vinie JAYSON Maxcy, MD;  Location: [facility] ENDOSCOPY;  Service: Cardiovascular;  Laterality: N/A;     PREVIOUS MEDICATIONS:   CURRENT MEDICATIONS:  Outpatient Encounter Medications as of 07/12/2024  Medication Sig   B Complex-C (SUPER B-COMPLEX + VITAMIN C PO) Take 1 capsule by mouth daily with supper.   cyanocobalamin  1000 MCG tablet Take 1,000 mcg by mouth daily.   denosumab  (PROLIA ) 60 MG/ML SOSY injection Inject 60 mg into the skin every 6 (six) months.   FREESTYLE LITE test strip    glucose monitoring kit (FREESTYLE) monitoring kit 1 each by Does not apply route as needed for other.   Lancets (FREESTYLE) lancets 1 each daily.   lenalidomide  (REVLIMID ) 5 MG capsule Take 1 capsule (5 mg total) by mouth daily for 21 days. Take 1 capsule (5 mg total) by mouth daily for 21 days and then take 7 days off. Repeat cycle   Magnesium 400 MG CAPS Take 400 mg by mouth daily.   memantine  (NAMENDA ) 5 MG  tablet Take 1 tablet (5 mg total) by mouth at bedtime.   metFORMIN  (GLUCOPHAGE ) 1000 MG tablet TAKE ONE-HALF (1/2) TABLET DAILY WITH BREAKFAST   nitroGLYCERIN  (NITROSTAT )  0.4 MG SL tablet Place 1 tablet (0.4 mg total) under the tongue every 5 (five) minutes as needed for chest pain (Max 3 doses in 15 mins.).   omeprazole  (PRILOSEC) 20 MG capsule Take 1 capsule (20 mg total) by mouth every morning.   Potassium Gluconate 80 MG TABS as directed Orally   pravastatin  (PRAVACHOL ) 10 MG tablet Take 1 tablet (10 mg total) by mouth daily.   pregabalin  (LYRICA ) 75 MG capsule Take 75 mg by mouth 2 (two) times daily.   Turmeric (QC TUMERIC COMPLEX PO) Take 1 capsule by mouth daily.   Vitamin D , Ergocalciferol , (DRISDOL ) 1.25 MG (50000 UNIT) CAPS capsule TAKE 1 CAPSULE BY MOUTH 1 TIME A WEEK   XARELTO  20 MG TABS tablet TAKE 1 TABLET DAILY WITH SUPPER   Zinc 50 MG TABS Take by mouth.   No facility-administered encounter medications on file as of 07/12/2024.     Objective:     PHYSICAL EXAMINATION:    VITALS:   Vitals:   07/12/24 1115  BP: 120/64  Pulse: (!) 58  Resp: 20  SpO2: 98%  Weight: 187 lb (84.8 kg)  Height: 5' 8 (1.727 m)    GEN:  The patient appears stated age and is in NAD. HEENT:  Normocephalic, atraumatic.   Neurological examination:  General: NAD, well-groomed, appears stated age. Orientation: The patient is alert. Oriented to person, place and not to date.  Cranial nerves: There is good facial symmetry.The speech is fluent and clear. No aphasia or dysarthria. Fund of knowledge is appropriate. Recent memory impaired and remote memory is normal.  Attention and concentration are normal.  Able to name objects and repeat phrases.  Hearing is intact to conversational tone.   Delayed recall 3/3  Sensation: Sensation is intact to light touch throughout Motor: Strength is at least antigravity x4. DTR's 1/4 in UE/LE      10/03/2022    7:00 AM  Montreal Cognitive Assessment    Visuospatial/ Executive (0/5) 5  Naming (0/3) 3  Attention: Read list of digits (0/2) 2  Attention: Read list of letters (0/1) 1  Attention: Serial 7 subtraction starting at 100 (0/3) 3  Language: Repeat phrase (0/2) 2  Language : Fluency (0/1) 1  Abstraction (0/2) 2  Delayed Recall (0/5) 2  Orientation (0/6) 5  Total 26  Adjusted Score (based on education) 26       07/12/2024   11:00 AM 01/29/2024   12:00 PM  MMSE - Mini Mental State Exam  Orientation to time 5 5  Orientation to Place 5 5  Registration 3 3  Attention/ Calculation 5 4  Recall 3 1  Language- name 2 objects 2 2  Language- repeat 1 1  Language- follow 3 step command 3 3  Language- read & follow direction 1 1  Write a sentence 1 1  Copy design 1 1  Total score 30 27       Movement examination: Tone: There is normal tone in the UE/LE Abnormal movements:  no tremor.  No myoclonus.  No asterixis.   Coordination:  There is no decremation with RAM's. Normal finger to nose  Gait and Station: The patient has no difficulty arising out of a deep-seated chair without the use of the hands. The patient's stride length is good.  Gait is cautious and narrow.   Thank you for allowing us  the opportunity to participate in the care of this nice patient. Please do not hesitate to contact us  for any questions  or concerns.   Total time spent on today's visit was 33 minutes dedicated to this patient today, preparing to see patient, examining the patient, ordering tests and/or medications and counseling the patient, documenting clinical information in the EHR or other health record, independently interpreting results and communicating results to the patient/family, discussing treatment and goals, answering patient's questions and coordinating care.  Cc:  Loreli Kins, MD  Camie Sevin 07/12/2024 11:49 AM

## 2024-07-12 NOTE — Patient Instructions (Signed)
 It was a pleasure to see you today at our office.   Recommendations:  Folllow up 6 months Keep taking B12 supplements   Continue Memantine  5mg   increase to twice a day   Continue follow up physical therapy   Whom to call:  Memory  decline, memory medications: Call our office [phone]      For assessment of decision of mental capacity and competency:  Call Dr. Rosaline Nine, geriatric psychiatrist at [phone]    If you have any severe symptoms of a stroke, or other severe issues such as confusion,severe chills or fever, etc call 911 or go to the ER as you may need to be evaluated further      RECOMMENDATIONS FOR ALL PATIENTS WITH MEMORY PROBLEMS: 1. Continue to exercise (Recommend 30 minutes of walking everyday, or 3 hours every week) 2. Increase social interactions - continue going to [place] and enjoy social gatherings with friends and family 3. Eat healthy, avoid fried foods and eat more fruits and vegetables 4. Maintain adequate blood pressure, blood sugar, and blood cholesterol level. Reducing the risk of stroke and cardiovascular disease also helps promoting better memory. 5. Avoid stressful situations. Live a simple life and avoid aggravations. Organize your time and prepare for the next day in anticipation. 6. Sleep well, avoid any interruptions of sleep and avoid any distractions in the bedroom that may interfere with adequate sleep quality 7. Avoid sugar, avoid sweets as there is a strong link between excessive sugar intake, diabetes, and cognitive impairment We discussed the Mediterranean diet, which has been shown to help patients reduce the risk of progressive memory disorders and reduces cardiovascular risk. This includes eating fish, eat fruits and green leafy vegetables, nuts like almonds and hazelnuts, walnuts, and also use olive oil. Avoid fast foods and fried foods as much as possible. Avoid sweets and sugar as sugar use has been linked to worsening of memory  function.  There is always a concern of gradual progression of memory problems. If this is the case, then we may need to adjust level of care according to patient needs. Support, both to the patient and caregiver, should then be put into place.      You have been referred for a neuropsychological evaluation (i.e., evaluation of memory and thinking abilities). Please bring someone with you to this appointment if possible, as it is helpful for the doctor to hear from both you and another adult who knows you well. Please bring eyeglasses and hearing aids if you wear them.    The evaluation will take approximately 3 hours and has two parts:   The first part is a clinical interview with the neuropsychologist (Dr. Richie or Dr. Jackquline). During the interview, the neuropsychologist will speak with you and the individual you brought to the appointment.    The second part of the evaluation is testing with the doctor's technician Neal or Luke). During the testing, the technician will ask you to remember different types of material, solve problems, and answer some questionnaires. Your family member will not be present for this portion of the evaluation.   Please note: We must reserve several hours of the neuropsychologist's time and the psychometrician's time for your evaluation appointment. As such, there is a No-Show fee of $100. If you are unable to attend any of your appointments, please contact our office as soon as possible to reschedule.    FALL PRECAUTIONS: Be cautious when walking. Scan the area for obstacles that may increase the  risk of trips and falls. When getting up in the mornings, sit up at the edge of the bed for a few minutes before getting out of bed. Consider elevating the bed at the head end to avoid drop of blood pressure when getting up. Walk always in a well-lit room (use night lights in the walls). Avoid area rugs or power cords from appliances in the middle of the walkways. Use a  walker or a cane if necessary and consider physical therapy for balance exercise. Get your eyesight checked regularly.  FINANCIAL OVERSIGHT: Supervision, especially oversight when making financial decisions or transactions is also recommended.  HOME SAFETY: Consider the safety of the kitchen when operating appliances like stoves, microwave oven, and blender. Consider having supervision and share cooking responsibilities until no longer able to participate in those. Accidents with firearms and other hazards in the house should be identified and addressed as well.   ABILITY TO BE LEFT ALONE: If patient is unable to contact 911 operator, consider using Life[facility], or when the need is there, arrange for someone to stay with patients. Smoking is a fire hazard, consider supervision or cessation. Risk of wandering should be assessed by caregiver and if detected at any point, supervision and safe proof recommendations should be instituted.  MEDICATION SUPERVISION: Inability to self-administer medication needs to be constantly addressed. Implement a mechanism to ensure safe administration of the medications.   DRIVING: Regarding driving, in patients with progressive memory problems, driving will be impaired. We advise to have someone else do the driving if trouble finding directions or if minor accidents are reported. Independent driving assessment is available to determine safety of driving.   If you are interested in the driving assessment, you can contact the following:  [facility] [employer] in [place] [phone]  [facility] [phone]  [facility] [phone] or [phone]    Mediterranean Diet A Mediterranean diet refers to food and lifestyle choices that are based on the traditions of countries located on the [employer]. This way of eating has been shown to help prevent certain conditions and improve outcomes for  people who have chronic diseases, like kidney disease and heart disease. What are tips for following this plan? Lifestyle  Cook and eat meals together with your family, when possible. Drink enough fluid to keep your urine clear or pale yellow. Be physically active every day. This includes: Aerobic exercise like running or swimming. Leisure activities like gardening, walking, or housework. Get 7-8 hours of sleep each night. If recommended by your health care provider, drink red wine in moderation. This means 1 glass a day for nonpregnant women and 2 glasses a day for men. A glass of wine equals 5 oz (150 mL). Reading food labels  Check the serving size of packaged foods. For foods such as rice and pasta, the serving size refers to the amount of cooked product, not dry. Check the total fat in packaged foods. Avoid foods that have saturated fat or trans fats. Check the ingredients list for added sugars, such as corn syrup. Shopping  At the grocery store, buy most of your food from the areas near the walls of the store. This includes: Fresh fruits and vegetables (produce). Grains, beans, nuts, and seeds. Some of these may be available in unpackaged forms or large amounts (in bulk). Fresh seafood. Poultry and eggs. Low-fat dairy products. Buy whole ingredients instead of prepackaged foods. Buy fresh fruits and vegetables in-season from local farmers markets.  Buy frozen fruits and vegetables in resealable bags. If you do not have access to quality fresh seafood, buy precooked frozen shrimp or canned fish, such as tuna, salmon, or sardines. Buy small amounts of raw or cooked vegetables, salads, or olives from the deli or salad bar at your store. Stock your pantry so you always have certain foods on hand, such as olive oil, canned tuna, canned tomatoes, rice, pasta, and beans. Cooking  Cook foods with extra-virgin olive oil instead of using butter or other vegetable oils. Have meat as a side  dish, and have vegetables or grains as your main dish. This means having meat in small portions or adding small amounts of meat to foods like pasta or stew. Use beans or vegetables instead of meat in common dishes like chili or lasagna. Experiment with different cooking methods. Try roasting or broiling vegetables instead of steaming or sauteing them. Add frozen vegetables to soups, stews, pasta, or rice. Add nuts or seeds for added healthy fat at each meal. You can add these to yogurt, salads, or vegetable dishes. Marinate fish or vegetables using olive oil, lemon juice, garlic, and fresh herbs. Meal planning  Plan to eat 1 vegetarian meal one day each week. Try to work up to 2 vegetarian meals, if possible. Eat seafood 2 or more times a week. Have healthy snacks readily available, such as: Vegetable sticks with hummus. Greek yogurt. Fruit and nut trail mix. Eat balanced meals throughout the week. This includes: Fruit: 2-3 servings a day Vegetables: 4-5 servings a day Low-fat dairy: 2 servings a day Fish, poultry, or lean meat: 1 serving a day Beans and legumes: 2 or more servings a week Nuts and seeds: 1-2 servings a day Whole grains: 6-8 servings a day Extra-virgin olive oil: 3-4 servings a day Limit red meat and sweets to only a few servings a month What are my food choices? Mediterranean diet Recommended Grains: Whole-grain pasta. Brown rice. Bulgar wheat. Polenta. Couscous. Whole-wheat bread. [place] Madeira. Vegetables: Artichokes. Beets. Broccoli. Cabbage. Carrots. Eggplant. Green beans. Chard. Kale. Spinach. Onions. Leeks. Peas. Squash. Tomatoes. Peppers. Radishes. Fruits: Apples. Apricots. Avocado. Berries. Bananas. Cherries. Dates. Figs. Grapes. Lemons. Melon. Oranges. Peaches. Plums. Pomegranate. Meats and other protein foods: Beans. Almonds. Sunflower seeds. Pine nuts. Peanuts. Cod. Salmon. Scallops. Shrimp. Tuna. Tilapia. Clams. Oysters. Eggs. Dairy: Low-fat milk. Cheese.  Greek yogurt. Beverages: Water. Red wine. Herbal tea. Fats and oils: Extra virgin olive oil. Avocado oil. Grape seed oil. Sweets and desserts: [place] yogurt with honey. Baked apples. Poached pears. Trail mix. Seasoning and other foods: Basil. Cilantro. Coriander. Cumin. Mint. Parsley. Sage. Rosemary. Tarragon. Garlic. Oregano. Thyme. Pepper. Balsalmic vinegar. Tahini. Hummus. Tomato sauce. Olives. Mushrooms. Limit these Grains: Prepackaged pasta or rice dishes. Prepackaged cereal with added sugar. Vegetables: Deep fried potatoes (french fries). Fruits: Fruit canned in syrup. Meats and other protein foods: Beef. Pork. Lamb. Poultry with skin. Hot dogs. [place]. Dairy: Ice cream. Sour cream. Whole milk. Beverages: Juice. Sugar-sweetened soft drinks. Beer. Liquor and spirits. Fats and oils: Butter. Canola oil. Vegetable oil. Beef fat (tallow). Lard. Sweets and desserts: Cookies. Cakes. Pies. Candy. Seasoning and other foods: Mayonnaise. Premade sauces and marinades. The items listed may not be a complete list. Talk with your dietitian about what dietary choices are right for you. Summary The Mediterranean diet includes both food and lifestyle choices. Eat a variety of fresh fruits and vegetables, beans, nuts, seeds, and whole grains. Limit the amount of red meat and sweets that you eat. Talk  with your health care provider about whether it is safe for you to drink red wine in moderation. This means 1 glass a day for nonpregnant women and 2 glasses a day for men. A glass of wine equals 5 oz (150 mL). This information is not intended to replace advice given to you by your health care provider. Make sure you discuss any questions you have with your health care provider. Document Released: 08/01/2016 Document Revised: 09/03/2016 Document Reviewed: 08/01/2016 Elsevier Interactive Patient Education  2017 [employer].  Your provider has requested that you have labwork completed today. Please go to  [facility] ([id number]) on the second floor of this building before leaving the office today. You do not need to check in. If you are not called within 15 minutes please check with the front desk.

## 2024-07-13 NOTE — Progress Notes (Incomplete)
 HEMATOLOGY/ONCOLOGY CLINIC VISIT NOTE  Date of Service: 07/14/2024  Patient Care Team: Loreli Kins, MD as PCP - General (Family Medicine) Mona Vinie BROCKS, MD as PCP - Cardiology (Cardiology)   CHIEF COMPLAINTS:  Follow-up for continued evaluation and management of multiple myeloma  HISTORY OF PRESENTING ILLNESS:  Please see previous note for details of initial presentation  CURRENT THERAPY:   Revlimid  maintenance 5 mg po 3 weeks on 1 week off.   INTERVAL HISTORY:  Justin [name] is a 83 y.o. male who is here for continued evaluation and management of his multiple myeloma.  Patient was last seen by me on 04/21/2024 and report having a mild cold infection with improved rhinorrhea, mild sneezing, and mild coughing. He also complained of memory issues.    -Discussed lab results on 07/14/2024 in detail with patient. CBC showed WBC of ***K, hemoglobin of ***, and platelets of ***K. -   MEDICAL HISTORY:  Past Medical History:  Diagnosis Date   Acute blood loss anemia    Arthritis    Atherosclerosis of native coronary artery of native heart without angina pectoris 03/20/2016   Benign essential hypertension    Chest pain 09/27/2018   Controlled type 2 diabetes mellitus with complication, without long-term current use of insulin  12/07/2014   Degeneration of lumbar intervertebral disc 01/30/2018   Gait abnormality    GERD (gastroesophageal reflux disease)    Gout    History of loop recorder    History of squamous cell carcinoma    Hyperlipidemia 12/13/2014   IPMN (intraductal papillary mucinous neoplasm) 02/20/2022   Left leg weakness    Low back pain 01/30/2018   Lumbar radiculopathy 01/30/2018   Mild cognitive impairment of uncertain or unknown etiology 07/16/2023   Multiple lacunar infarcts 2017   Bilateral basal ganglia; right frontal white matter     Multiple myeloma without remission 03/10/2019   Numbness 06/01/2016   Osteoporosis 03/10/2019   Parotid mass  07/08/2016   Paroxysmal SVT (supraventricular tachycardia) (HCC)    a. 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 Waddell   BASAL CELL CARCINOMA EXCISION     off of back   ELECTROPHYSIOLOGIC STUDY N/A 05/01/2015   Procedure: SVT Ablation;  Surgeon: Danelle LELON Waddell, MD;  Location: [facility] INVASIVE CV LAB;  Service: Cardiovascular;  Laterality: N/A;   EP IMPLANTABLE DEVICE N/A 06/04/2016   Procedure: Loop Recorder Insertion;  Surgeon: Lynwood Rakers, MD;  Location: [facility] INVASIVE CV LAB;  Service: Cardiovascular;  Laterality: N/A;   implantable loop recorder removal     ILR removed by Dr Rakers   INGUINAL HERNIA REPAIR Bilateral 06/16/2017   Procedure: LAPAROSCOPIC BILATERAL INGUINAL HERNIA REPAIR;  Surgeon: Signe Mitzie LABOR, MD;  Location: [facility] OR;  Service: General;  Laterality: Bilateral;   INSERTION OF MESH Bilateral 06/16/2017   Procedure: INSERTION OF MESH;  Surgeon: Signe Mitzie LABOR, MD;  Location: [facility] OR;  Service: General;  Laterality: Bilateral;   MASS EXCISION Right 07/28/2018   Procedure: EXCISION OF RIGHT FOREARM MASS;  Surgeon: Signe Mitzie LABOR, MD;  Location: [facility] ORS;  Service: General;  Laterality: Right;   SQUAMOUS CELL CARCINOMA EXCISION     TEE WITHOUT CARDIOVERSION N/A 06/04/2016  Procedure: TRANSESOPHAGEAL ECHOCARDIOGRAM (TEE);  Surgeon: Vinie JAYSON Maxcy, MD;  Location: [facility] ENDOSCOPY;  Service: Cardiovascular;  Laterality: N/A;    SOCIAL HISTORY: Social History   Socioeconomic History   Marital status: Married    Spouse name: Not on file   Number of children: 3   Years of education: 17   Highest education level: Bachelor's degree (e.g., BA, AB, BS)  Occupational History   Occupation: Retired    Comment:  Art gallery manager  Tobacco Use   Smoking status: Never   Smokeless tobacco: Never  Vaping Use   Vaping status: Never Used  Substance and Sexual Activity   Alcohol use: No   Drug use: No   Sexual activity: Not on file  Other Topics Concern   Not on file  Social History Narrative   Right handed   No caffeine   Two story home   Retired   Surveyor, quantity   Social Drivers of Corporate investment banker Strain: Not on file  Food Insecurity: Not on file  Transportation Needs: Not on file  Physical Activity: Not on file  Stress: Not on file  Social Connections: Not on file  Intimate Partner Violence: Not on file   Justin [name] is retired. His daughter is Interior and spatial designer of nursing for a nursing home in [place], [place].    FAMILY HISTORY: Family History  Problem Relation Age of Onset   Stroke Mother    Hypertension Mother    Language disorder Mother        Aphasia; unknown cause   Alzheimer's disease Father    Heart failure Father    Dementia Sister        Lewy body   Down syndrome Daughter     ALLERGIES:  is allergic to other and no known allergies.  MEDICATIONS:  Current Outpatient Medications  Medication Sig Dispense Refill   B Complex-C (SUPER B-COMPLEX + VITAMIN C PO) Take 1 capsule by mouth daily with supper.     cyanocobalamin  1000 MCG tablet Take 1,000 mcg by mouth daily.     denosumab  (PROLIA ) 60 MG/ML SOSY injection Inject 60 mg into the skin every 6 (six) months.     FREESTYLE LITE test strip      glucose monitoring kit (FREESTYLE) monitoring kit 1 each by Does not apply route as needed for other.     Lancets (FREESTYLE) lancets 1 each daily.     lenalidomide  (REVLIMID ) 5 MG capsule Take 1 capsule (5 mg total) by mouth daily for 21 days. Take 1 capsule (5 mg total) by mouth daily for 21 days and then take 7 days off. Repeat cycle 21 capsule 0   Magnesium 400 MG CAPS Take 400 mg by mouth daily.     memantine  (NAMENDA ) 5 MG tablet Take 1 tablet (5 mg total) by mouth at bedtime. 90  tablet 1   metFORMIN  (GLUCOPHAGE ) 1000 MG tablet TAKE ONE-HALF (1/2) TABLET DAILY WITH BREAKFAST 90 tablet 3   nitroGLYCERIN  (NITROSTAT ) 0.4 MG SL tablet Place 1 tablet (0.4 mg total) under the tongue every 5 (five) minutes as needed for chest pain (Max 3 doses in 15 mins.). 30 tablet 0   omeprazole  (PRILOSEC) 20 MG capsule Take 1 capsule (20 mg total) by mouth every morning. 90 capsule 3   Potassium Gluconate 80 MG TABS as directed Orally     pravastatin  (PRAVACHOL ) 10 MG tablet Take 1 tablet (10 mg total) by mouth daily. 90 tablet 3   pregabalin  (LYRICA ) 75 MG capsule  Take 75 mg by mouth 2 (two) times daily.     Turmeric (QC TUMERIC COMPLEX PO) Take 1 capsule by mouth daily.     Vitamin D , Ergocalciferol , (DRISDOL ) 1.25 MG (50000 UNIT) CAPS capsule TAKE 1 CAPSULE BY MOUTH 1 TIME A WEEK 12 capsule 5   XARELTO  20 MG TABS tablet TAKE 1 TABLET DAILY WITH SUPPER 90 tablet 3   Zinc 50 MG TABS Take by mouth.     No current facility-administered medications for this visit.    REVIEW OF SYSTEMS:    10 Point review of Systems was done is negative except as noted above.   PHYSICAL EXAMINATION:  .There were no vitals taken for this visit.   GENERAL:alert, in no acute distress and comfortable SKIN: no acute rashes, no significant lesions EYES: conjunctiva are pink and non-injected, sclera anicteric OROPHARYNX: MMM, no exudates, no oropharyngeal erythema or ulceration NECK: supple, no JVD LYMPH:  no palpable lymphadenopathy in the cervical, axillary or inguinal regions LUNGS: clear to auscultation b/l with normal respiratory effort HEART: regular rate & rhythm ABDOMEN:  normoactive bowel sounds , non tender, not distended. Extremity: no pedal edema PSYCH: alert & oriented x 3 with fluent speech NEURO: no focal motor/sensory deficits   LABORATORY DATA:  I have reviewed the data as listed.     Latest Ref Rng & Units 04/15/2024    1:45 PM 01/12/2024    1:03 PM 10/20/2023    9:04 AM  CBC   WBC 4.0 - 10.5 K/uL 2.8  3.3  2.8   Hemoglobin 13.0 - 17.0 g/dL 87.7  87.4  86.8   Hematocrit 39.0 - 52.0 % 35.5  35.9  39.3   Platelets 150 - 400 K/uL 119  105  92        Latest Ref Rng & Units 04/15/2024    1:45 PM 01/12/2024    1:03 PM 10/20/2023    9:04 AM  CMP  Glucose 70 - 99 mg/dL 877  886  870   BUN 8 - 23 mg/dL 20  20  23    Creatinine 0.61 - 1.24 mg/dL 8.87  8.74  8.76   Sodium 135 - 145 mmol/L 141  137  136   Potassium 3.5 - 5.1 mmol/L 3.8  4.2  4.3   Chloride 98 - 111 mmol/L 110  106  105   CO2 22 - 32 mmol/L 27  25  24    Calcium  8.9 - 10.3 mg/dL 8.7  8.5  8.6   Total Protein 6.5 - 8.1 g/dL 6.4  6.3  6.3   Total Bilirubin 0.0 - 1.2 mg/dL 0.8  1.0  0.9   Alkaline Phos 38 - 126 U/L 55  52  43   AST 15 - 41 U/L 16  19  20    ALT 0 - 44 U/L 8  8  9          RADIOGRAPHIC STUDIES: I have personally reviewed the radiological images as listed and agreed with the findings in the report.       Bone Marrow Biopsy 12/24/2016 (Accession [id number])  Diagnosis Bone Marrow, Aspirate,Biopsy, and Clot, left iliac crest - HYPERCELLULAR BONE MARROW FOR AGE WITH PLASMA CELL NEOPLASM. - SEE COMMENT. PERIPHERAL BLOOD: - NORMOCYTIC-NORMOCHROMIC ANEMIA. - LEUKOPENIA.  DG Bone Survey Met (Accession [id number]) (Order [id number])  Imaging  Date: 11/28/2016 Department: [facility]-RADIOLOG[facility]STIC Released By: Alan CROME Priddy Authorizing: Emaline Candida Saran, MD  Exam Information   Status Exam [facility]  Exam Ended  Final 99 11/28/2016 11:59 AM 11/28/2016 12:36 PM  PACS Images   Show images for DG Bone Survey Met  Study Result   CLINICAL DATA:  Monoclonal gammopathy of unknown significance. History of squamous cell carcinoma excision, basal cell carcinoma excision.   EXAM: METASTATIC BONE SURVEY   COMPARISON:  CT neck, chest, abdomen and pelvis from 10/29/16, MRI of the head from 06/01/2016, CXR 10/27/2016, lumbar spine radiographs 06/24/2016    FINDINGS: Lateral skull: Small occipital lucency which may represent a normal arachnoid granulation posteriorly in the occiput. No definite lytic abnormality.   Cervical spine AP and lateral: Disc space narrowing at C2-3, and from C4 through C7. C4-5, C5-6 and C6-7 uncovertebral joint spurring bilaterally. No lytic abnormality.   Thoracic spine AP and lateral: T8-9 right-sided osteophytes and to a lesser degree T9-10. No lytic abnormality. Slight multilevel lumbar thoracic disc space narrowing likely degenerative.   Lumbar spine AP and lateral: Disc space narrowing at L4-5 and to a greater extent L5-S1. No lytic disease. L3 through S1 facet sclerosis.   AP pelvis: Negative for lytic disease.   Bilateral upper and lower extremities shoulders through wrist and from the hips through ankle: Negative for lytic disease. Joint space narrowing of the femorotibial compartments both knees. Subchondral cyst of the right patella.   CXR: Clear lungs. Cardiac implantable monitoring device projects over the left heart. Aortic atherosclerosis. No lytic disease.   IMPRESSION: No findings suspicious for lytic disease.     Electronically Signed   By: Alm Medin M.D.   On: 11/28/2016 14:58     ASSESSMENT & PLAN:   83 y.o. 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 (12/2016) Peak M spike 2.6  Cytogenetics and Myeloma FISH panel.- trisomy 11 Patient has normocytic anemia without any other clear etiology. (>2g/dl lower that lower limit of normal which is 13) which per criteria would place this in the Multiple myeloma category as opposed to Smoldering Multiple myeloma (Borderline criterion)  No overt renal failure or hypercalcemia at this time No focal bone pains though he has significant chronic back pain related to degenerative disc disease. Bone survey shows no overt lytic lesions.  10/16/18 CT Coronary Morph  revealed Coronary artery calcium  score 299 Agatston units, this places the patient in the 47th percentile for age and gender, suggesting intermediate risk for future cardiac events. 2. Nonobstructive coronary disease.  12/08/18 Bone Density study revealed that the pt has osteoporosis  2) Anemia and thrombocytopenia -- related to treatment (Revlimid ) -stable  3) Grade 1 Neuropathy/radiculopathy-- in b/l feet and halfway up lower leg without pain  4) Borderline low B12 levels, previously 299 --- on replacement -  -continue replacement.  5) h/o recurrent CVA - thought to be related to small vessel disease as per neurology. Has a small PFO which could be an additional risk factor as well as his afib  6)  P afib Plan -Continue on Xarelto  per cardiology.  7) H/o recurrent SCC -Underwent surgical excision on right forearm with Dr. Mitzie Freund on 07/28/18. His surgical pathology showed evidence of Squamous Cell Carcinoma. Plan -continue dermatology followup  10) Lower extremity discomfort - improved, stable. Likely Discogenic pain -US  on 12/2017 revealed no blood clot -Pt has established care with orthopedist and PT. - has had muscle cramps from his statins and this is being mx by his cardiologist.  11) Patient Active Problem List   Diagnosis  Date Noted   Mild cognitive impairment of uncertain or unknown etiology 07/16/2023   History of squamous cell carcinoma    IPMN (intraductal papillary mucinous neoplasm) 02/20/2022   Osteoporosis 03/10/2019   Multiple myeloma without remission 03/10/2019   Chest pain 09/27/2018   Degeneration of lumbar intervertebral disc 01/30/2018   Low back pain 01/30/2018   Lumbar radiculopathy 01/30/2018   History of loop recorder 06/12/2017   Gait abnormality    Benign essential hypertension    Paroxysmal SVT (supraventricular tachycardia) (HCC)    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   TIA (transient ischemic attack)  06/02/2016   Right arm numbness 06/01/2016   Atherosclerosis of native coronary artery of native heart without angina pectoris 03/20/2016   Multiple lacunar infarcts 2017   GERD (gastroesophageal reflux disease)    Gout    S/P RF ablation operation for arrhythmia 12/13/2014   Hyperlipidemia 12/13/2014   Controlled type 2 diabetes mellitus with complication, without long-term current use of insulin  12/07/2014    PLAN:  -his labs have been fairly stable -Discussed lab results from 04/15/2024 in detail with patient. CBC showed WBC of 2.8K, hemoglobin of 12.2, and platelets of 119K. -hgb stable -WBCs borderline low and not concerning -platelets improved from 105K 3 months ago to 119K more recently -myeloma lab from 04/15/2024 shows M protein of 0.2 g/dL -K/L light chains WNL with normal ratio -CMP shows normal calcium  levels and improved kidney function. There are no other major issues on blood chemistries -recommend trying to connect with VA system to see if he receives any Veteran benefits -Patient has been tolerating his current Revlimid  dosage well without any new or severe toxicities.   -okay to delay treatment by one week to allow him to heal completely. If there is some mild delay in receiving Revlimid  treatment during this time, it would not be concerning -discussed option of swab testing though if his infection is nearly resolved, swab testing results may not effect his treatment  -Recommend to continue B-Complex and Vitamin-D supplement.  -Continue Prolia .  -answered all of patient's questions in detail  FOLLOW UP: ***  The total time spent in the appointment was *** minutes* .  All of the patient's questions were answered with apparent satisfaction. The patient knows to call the clinic with any problems, questions or concerns.   Emaline Saran MD MS AAHIVMS [facility] Hematology/Oncology Physician [facility]  .*Total Encounter Time as defined by the Centers for  Medicare and Medicaid Services includes, in addition to the face-to-face time of a patient visit (documented in the note above) non-face-to-face time: obtaining and reviewing outside history, ordering and reviewing medications, tests or procedures, care coordination (communications with other health care professionals or caregivers) and documentation in the medical record.    I,Mitra Faeizi,acting as a Neurosurgeon for Emaline Saran, MD.,have documented all relevant documentation on the behalf of Emaline Saran, MD,as directed by  Emaline Saran, MD while in the presence of Emaline Saran, MD.  ***

## 2024-07-14 ENCOUNTER — Inpatient Hospital Stay: Admitting: Hematology

## 2024-07-15 DIAGNOSIS — M542 Cervicalgia: Secondary | ICD-10-CM | POA: Diagnosis not present

## 2024-07-15 DIAGNOSIS — M25512 Pain in left shoulder: Secondary | ICD-10-CM | POA: Diagnosis not present

## 2024-07-26 ENCOUNTER — Inpatient Hospital Stay: Attending: Hematology

## 2024-07-26 DIAGNOSIS — Z8673 Personal history of transient ischemic attack (TIA), and cerebral infarction without residual deficits: Secondary | ICD-10-CM | POA: Insufficient documentation

## 2024-07-26 DIAGNOSIS — G629 Polyneuropathy, unspecified: Secondary | ICD-10-CM | POA: Insufficient documentation

## 2024-07-26 DIAGNOSIS — C9000 Multiple myeloma not having achieved remission: Secondary | ICD-10-CM | POA: Diagnosis not present

## 2024-07-26 DIAGNOSIS — M5416 Radiculopathy, lumbar region: Secondary | ICD-10-CM | POA: Diagnosis not present

## 2024-07-26 DIAGNOSIS — E538 Deficiency of other specified B group vitamins: Secondary | ICD-10-CM | POA: Insufficient documentation

## 2024-07-26 DIAGNOSIS — Z7901 Long term (current) use of anticoagulants: Secondary | ICD-10-CM | POA: Diagnosis not present

## 2024-07-26 DIAGNOSIS — Z85828 Personal history of other malignant neoplasm of skin: Secondary | ICD-10-CM | POA: Diagnosis not present

## 2024-07-26 DIAGNOSIS — I480 Paroxysmal atrial fibrillation: Secondary | ICD-10-CM | POA: Insufficient documentation

## 2024-07-26 LAB — CBC WITH DIFFERENTIAL (CANCER CENTER ONLY)
Abs Immature Granulocytes: 0 K/uL (ref 0.00–0.07)
Basophils Absolute: 0.1 K/uL (ref 0.0–0.1)
Basophils Relative: 3 %
Eosinophils Absolute: 0.2 K/uL (ref 0.0–0.5)
Eosinophils Relative: 6 %
HCT: 37.9 % — ABNORMAL LOW (ref 39.0–52.0)
Hemoglobin: 12.8 g/dL — ABNORMAL LOW (ref 13.0–17.0)
Immature Granulocytes: 0 %
Lymphocytes Relative: 26 %
Lymphs Abs: 0.8 K/uL (ref 0.7–4.0)
MCH: 30.3 pg (ref 26.0–34.0)
MCHC: 33.8 g/dL (ref 30.0–36.0)
MCV: 89.8 fL (ref 80.0–100.0)
Monocytes Absolute: 0.5 K/uL (ref 0.1–1.0)
Monocytes Relative: 16 %
Neutro Abs: 1.5 K/uL — ABNORMAL LOW (ref 1.7–7.7)
Neutrophils Relative %: 49 %
Platelet Count: 115 K/uL — ABNORMAL LOW (ref 150–400)
RBC: 4.22 MIL/uL (ref 4.22–5.81)
RDW: 14.6 % (ref 11.5–15.5)
WBC Count: 3.1 K/uL — ABNORMAL LOW (ref 4.0–10.5)
nRBC: 0 % (ref 0.0–0.2)

## 2024-07-26 LAB — CMP (CANCER CENTER ONLY)
ALT: 10 U/L (ref 0–44)
AST: 19 U/L (ref 15–41)
Albumin: 3.9 g/dL (ref 3.5–5.0)
Alkaline Phosphatase: 65 U/L (ref 38–126)
Anion gap: 5 (ref 5–15)
BUN: 23 mg/dL (ref 8–23)
CO2: 29 mmol/L (ref 22–32)
Calcium: 8.5 mg/dL — ABNORMAL LOW (ref 8.9–10.3)
Chloride: 104 mmol/L (ref 98–111)
Creatinine: 1.24 mg/dL (ref 0.61–1.24)
GFR, Estimated: 57 mL/min — ABNORMAL LOW (ref >60)
Glucose, Bld: 123 mg/dL — ABNORMAL HIGH (ref 70–99)
Potassium: 4.4 mmol/L (ref 3.5–5.1)
Sodium: 138 mmol/L (ref 135–145)
Total Bilirubin: 0.7 mg/dL (ref 0.0–1.2)
Total Protein: 6.4 g/dL — ABNORMAL LOW (ref 6.5–8.1)

## 2024-07-27 DIAGNOSIS — M25512 Pain in left shoulder: Secondary | ICD-10-CM | POA: Diagnosis not present

## 2024-07-27 DIAGNOSIS — M542 Cervicalgia: Secondary | ICD-10-CM | POA: Diagnosis not present

## 2024-07-27 LAB — KAPPA/LAMBDA LIGHT CHAINS
Kappa free light chain: 27.8 mg/L — ABNORMAL HIGH (ref 3.3–19.4)
Kappa, lambda light chain ratio: 0.89 (ref 0.26–1.65)
Lambda free light chains: 31.3 mg/L — ABNORMAL HIGH (ref 5.7–26.3)

## 2024-07-28 ENCOUNTER — Ambulatory Visit: Payer: Medicare Other | Admitting: Physician Assistant

## 2024-07-29 LAB — MULTIPLE MYELOMA PANEL, SERUM
Albumin SerPl Elph-Mcnc: 3.5 g/dL (ref 2.9–4.4)
Albumin/Glob SerPl: 1.4 (ref 0.7–1.7)
Alpha 1: 0.2 g/dL (ref 0.0–0.4)
Alpha2 Glob SerPl Elph-Mcnc: 0.7 g/dL (ref 0.4–1.0)
B-Globulin SerPl Elph-Mcnc: 0.9 g/dL (ref 0.7–1.3)
Gamma Glob SerPl Elph-Mcnc: 0.9 g/dL (ref 0.4–1.8)
Globulin, Total: 2.6 g/dL (ref 2.2–3.9)
IgA: 79 mg/dL (ref 61–437)
IgG (Immunoglobin G), Serum: 894 mg/dL (ref 603–1613)
IgM (Immunoglobulin M), Srm: 19 mg/dL (ref 15–143)
M Protein SerPl Elph-Mcnc: 0.1 g/dL — ABNORMAL HIGH
Total Protein ELP: 6.1 g/dL (ref 6.0–8.5)

## 2024-08-02 ENCOUNTER — Other Ambulatory Visit: Payer: Self-pay

## 2024-08-02 DIAGNOSIS — C9000 Multiple myeloma not having achieved remission: Secondary | ICD-10-CM

## 2024-08-02 DIAGNOSIS — M25512 Pain in left shoulder: Secondary | ICD-10-CM | POA: Diagnosis not present

## 2024-08-02 DIAGNOSIS — M542 Cervicalgia: Secondary | ICD-10-CM | POA: Diagnosis not present

## 2024-08-02 MED ORDER — LENALIDOMIDE 5 MG PO CAPS: 5.0000 mg | ORAL_CAPSULE | Freq: Every day | ORAL | 0 refills | Status: DC

## 2024-08-03 ENCOUNTER — Inpatient Hospital Stay (HOSPITAL_BASED_OUTPATIENT_CLINIC_OR_DEPARTMENT_OTHER): Admitting: Hematology

## 2024-08-03 VITALS — BP 115/92 | HR 51 | Temp 97.2°F | Resp 20 | Wt 188.1 lb

## 2024-08-03 DIAGNOSIS — M5416 Radiculopathy, lumbar region: Secondary | ICD-10-CM | POA: Diagnosis not present

## 2024-08-03 DIAGNOSIS — C9000 Multiple myeloma not having achieved remission: Secondary | ICD-10-CM

## 2024-08-03 DIAGNOSIS — I480 Paroxysmal atrial fibrillation: Secondary | ICD-10-CM | POA: Diagnosis not present

## 2024-08-03 DIAGNOSIS — G629 Polyneuropathy, unspecified: Secondary | ICD-10-CM | POA: Diagnosis not present

## 2024-08-03 DIAGNOSIS — E538 Deficiency of other specified B group vitamins: Secondary | ICD-10-CM | POA: Diagnosis not present

## 2024-08-09 DIAGNOSIS — M25512 Pain in left shoulder: Secondary | ICD-10-CM | POA: Diagnosis not present

## 2024-08-09 DIAGNOSIS — M542 Cervicalgia: Secondary | ICD-10-CM | POA: Diagnosis not present

## 2024-08-09 NOTE — Progress Notes (Signed)
 HEMATOLOGY/ONCOLOGY CLINIC VISIT NOTE  Date of Service: .08/03/2024    Patient Care Team: Loreli Kins, MD as PCP - General (Family Medicine) Mona Vinie BROCKS, MD as PCP - Cardiology (Cardiology)   CHIEF COMPLAINTS:  Follow-up for continued evaluation and management of multiple myeloma  HISTORY OF PRESENTING ILLNESS:  Please see previous note for details of initial presentation  CURRENT THERAPY:   Revlimid  maintenance 5 mg po 3 weeks on 1 week off.   INTERVAL HISTORY:  Justin [name] is a 83 y.o. male who is here for continued evaluation and management of his multiple myeloma.  Patient notes no acute new symptoms since his last clinic visit.  He notes that he has been compliant with Revlimid  maintenance treatment. He notes intermittent back pains and muscle cramps from his degenerative disc disease and neuropathy. No acute new focal back pains. Continues to have issues with his memory and does follow with neurology and has been on memantine . No new leg swelling.  No new chest pain or shortness of breath. Recent labs done by us  were reviewed with him in details.  MEDICAL HISTORY:  Past Medical History:  Diagnosis Date   Acute blood loss anemia    Arthritis    Atherosclerosis of native coronary artery of native heart without angina pectoris 03/20/2016   Benign essential hypertension    Chest pain 09/27/2018   Controlled type 2 diabetes mellitus with complication, without long-term current use of insulin  12/07/2014   Degeneration of lumbar intervertebral disc 01/30/2018   Gait abnormality    GERD (gastroesophageal reflux disease)    Gout    History of loop recorder    History of squamous cell carcinoma    Hyperlipidemia 12/13/2014   IPMN (intraductal papillary mucinous neoplasm) 02/20/2022   Left leg weakness    Low back pain 01/30/2018   Lumbar radiculopathy 01/30/2018   Mild cognitive impairment of uncertain or unknown etiology 07/16/2023   Multiple  lacunar infarcts 2017   Bilateral basal ganglia; right frontal white matter     Multiple myeloma without remission 03/10/2019   Numbness 06/01/2016   Osteoporosis 03/10/2019   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   Paroxysmal SVT (supraventricular tachycardia) (HCC)    a. 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 Waddell   BASAL CELL CARCINOMA EXCISION     off of back   ELECTROPHYSIOLOGIC STUDY N/A 05/01/2015   Procedure: SVT Ablation;  Surgeon: Danelle LELON Waddell, MD;  Location: [facility] INVASIVE CV LAB;  Service: Cardiovascular;  Laterality: N/A;   EP IMPLANTABLE DEVICE N/A 06/04/2016   Procedure: Loop Recorder Insertion;  Surgeon: Lynwood Rakers, MD;  Location: [facility] INVASIVE CV LAB;  Service: Cardiovascular;  Laterality: N/A;   implantable loop recorder removal     ILR removed by Dr Rakers   INGUINAL HERNIA REPAIR Bilateral 06/16/2017   Procedure: LAPAROSCOPIC BILATERAL INGUINAL HERNIA REPAIR;  Surgeon: Signe Mitzie LABOR, MD;  Location: [facility] OR;  Service: General;  Laterality: Bilateral;   INSERTION OF MESH Bilateral 06/16/2017   Procedure: INSERTION OF MESH;  Surgeon: Signe Mitzie LABOR, MD;  Location: [facility] OR;  Service: General;  Laterality: Bilateral;   MASS EXCISION Right 07/28/2018   Procedure: EXCISION OF RIGHT FOREARM MASS;  Surgeon: Signe Mitzie LABOR, MD;  Location: [facility] ORS;  Service: General;  Laterality: Right;   SQUAMOUS CELL CARCINOMA EXCISION     TEE WITHOUT CARDIOVERSION N/A 06/04/2016   Procedure: TRANSESOPHAGEAL ECHOCARDIOGRAM (TEE);  Surgeon: Vinie JAYSON Maxcy, MD;  Location: [facility] ENDOSCOPY;  Service: Cardiovascular;  Laterality: N/A;    SOCIAL HISTORY: Social History   Socioeconomic History   Marital status: Married    Spouse name:  Not on file   Number of children: 3   Years of education: 17   Highest education level: Bachelor's degree (e.g., BA, AB, BS)  Occupational History   Occupation: Retired    Comment: Art gallery manager  Tobacco Use   Smoking status: Never   Smokeless tobacco: Never  Vaping Use   Vaping status: Never Used  Substance and Sexual Activity   Alcohol use: No   Drug use: No   Sexual activity: Not on file  Other Topics Concern   Not on file  Social History Narrative   Right handed   No caffeine   Two story home   Retired   Surveyor, quantity   Social Drivers of Corporate investment banker Strain: Not on file  Food Insecurity: Not on file  Transportation Needs: Not on file  Physical Activity: Not on file  Stress: Not on file  Social Connections: Not on file  Intimate Partner Violence: Not on file   Justin [name] is retired. His daughter is Interior and spatial designer of nursing for a nursing home in [place], [place].    FAMILY HISTORY: Family History  Problem Relation Age of Onset   Stroke Mother    Hypertension Mother    Language disorder Mother        Aphasia; unknown cause   Alzheimer's disease Father    Heart failure Father    Dementia Sister        Lewy body   Down syndrome Daughter     ALLERGIES:  is allergic to other and no known allergies.  MEDICATIONS:  Current Outpatient Medications  Medication Sig Dispense Refill   B Complex-C (SUPER B-COMPLEX + VITAMIN C PO) Take 1 capsule by mouth daily with supper.     cyanocobalamin  1000 MCG tablet Take 1,000 mcg by mouth daily.     denosumab  (PROLIA ) 60 MG/ML SOSY injection Inject 60 mg into the skin every 6 (six) months.     FREESTYLE LITE test strip      glucose monitoring kit (FREESTYLE) monitoring kit 1 each by Does not apply route as needed for other.     Lancets (FREESTYLE) lancets 1 each daily.     lenalidomide  (REVLIMID ) 5 MG capsule Take 1 capsule (5 mg total) by mouth daily. Take 1 capsule (5 mg total) by mouth daily for 21 days and then take 7 days  off. 21 capsule 0   Magnesium 400 MG CAPS Take 400 mg by mouth daily.     memantine  (NAMENDA ) 5 MG tablet Take 1 tablet (5 mg total) by mouth at bedtime. 90 tablet 1   metFORMIN  (GLUCOPHAGE ) 1000 MG tablet TAKE ONE-HALF (1/2) TABLET DAILY WITH BREAKFAST 90 tablet 3   nitroGLYCERIN  (NITROSTAT ) 0.4 MG SL tablet Place 1 tablet (0.4 mg total) under the tongue every 5 (five) minutes as needed for chest pain (Max 3 doses in 15 mins.). 30 tablet 0   omeprazole  (PRILOSEC) 20 MG capsule Take 1 capsule (20 mg total) by mouth every morning. 90 capsule 3   Potassium Gluconate 80 MG TABS as directed Orally  pravastatin  (PRAVACHOL ) 10 MG tablet Take 1 tablet (10 mg total) by mouth daily. 90 tablet 3   pregabalin  (LYRICA ) 75 MG capsule Take 75 mg by mouth 2 (two) times daily.     Turmeric (QC TUMERIC COMPLEX PO) Take 1 capsule by mouth daily.     Vitamin D , Ergocalciferol , (DRISDOL ) 1.25 MG (50000 UNIT) CAPS capsule TAKE 1 CAPSULE BY MOUTH 1 TIME A WEEK 12 capsule 5   XARELTO  20 MG TABS tablet TAKE 1 TABLET DAILY WITH SUPPER 90 tablet 3   Zinc 50 MG TABS Take by mouth.     No current facility-administered medications for this visit.    REVIEW OF SYSTEMS:    10 Point review of Systems was done is negative except as noted above.  PHYSICAL EXAMINATION:  .BP (!) 115/92   Pulse (!) 51   Temp (!) 97.2 F (36.2 C)   Resp 20   Wt 188 lb 1.6 oz (85.3 kg)   SpO2 99%   BMI 28.60 kg/m   NAD GENERAL:alert, in no acute distress and comfortable SKIN: no acute rashes, no significant lesions EYES: conjunctiva are pink and non-injected, sclera anicteric OROPHARYNX: MMM, no exudates, no oropharyngeal erythema or ulceration NECK: supple, no JVD LYMPH:  no palpable lymphadenopathy in the cervical, axillary or inguinal regions LUNGS: clear to auscultation b/l with normal respiratory effort HEART: regular rate & rhythm ABDOMEN:  normoactive bowel sounds , non tender, not distended.,  No palpable  hepatosplenomegaly Extremity: no pedal edema PSYCH: alert & oriented x 3 with fluent speech NEURO: no focal motor/sensory deficits   LABORATORY DATA:  I have reviewed the data as listed.     Latest Ref Rng & Units 07/26/2024   11:08 AM 04/15/2024    1:45 PM 01/12/2024    1:03 PM  CBC  WBC 4.0 - 10.5 K/uL 3.1  2.8  3.3   Hemoglobin 13.0 - 17.0 g/dL 87.1  87.7  87.4   Hematocrit 39.0 - 52.0 % 37.9  35.5  35.9   Platelets 150 - 400 K/uL 115  119  105        Latest Ref Rng & Units 07/26/2024   11:08 AM 04/15/2024    1:45 PM 01/12/2024    1:03 PM  CMP  Glucose 70 - 99 mg/dL 876  877  886   BUN 8 - 23 mg/dL 23  20  20    Creatinine 0.61 - 1.24 mg/dL 8.75  8.87  8.74   Sodium 135 - 145 mmol/L 138  141  137   Potassium 3.5 - 5.1 mmol/L 4.4  3.8  4.2   Chloride 98 - 111 mmol/L 104  110  106   CO2 22 - 32 mmol/L 29  27  25    Calcium  8.9 - 10.3 mg/dL 8.5  8.7  8.5   Total Protein 6.5 - 8.1 g/dL 6.4  6.4  6.3   Total Bilirubin 0.0 - 1.2 mg/dL 0.7  0.8  1.0   Alkaline Phos 38 - 126 U/L 65  55  52   AST 15 - 41 U/L 19  16  19    ALT 0 - 44 U/L 10  8  8          RADIOGRAPHIC STUDIES: I have personally reviewed the radiological images as listed and agreed with the findings in the report.       Bone Marrow Biopsy 12/24/2016 (Accession [id number])  Diagnosis Bone Marrow, Aspirate,Biopsy, and Clot, left iliac crest - HYPERCELLULAR BONE MARROW  FOR AGE WITH PLASMA CELL NEOPLASM. - SEE COMMENT. PERIPHERAL BLOOD: - NORMOCYTIC-NORMOCHROMIC ANEMIA. - LEUKOPENIA.  DG Bone Survey Met (Accession [id number]) (Order [id number])  Imaging  Date: 11/28/2016 Department: [facility] Released By: Alan LITTIE Ee Authorizing: Emaline Candida Saran, MD  Exam Information   Status Exam Begun  Exam Ended   Final 99 11/28/2016 11:59 AM 11/28/2016 12:36 PM  PACS Images   Show images for DG Bone Survey Met  Study Result   CLINICAL DATA:  Monoclonal gammopathy of  unknown significance. History of squamous cell carcinoma excision, basal cell carcinoma excision.   EXAM: METASTATIC BONE SURVEY   COMPARISON:  CT neck, chest, abdomen and pelvis from 10/29/16, MRI of the head from 06/01/2016, CXR 10/27/2016, lumbar spine radiographs 06/24/2016   FINDINGS: Lateral skull: Small occipital lucency which may represent a normal arachnoid granulation posteriorly in the occiput. No definite lytic abnormality.   Cervical spine AP and lateral: Disc space narrowing at C2-3, and from C4 through C7. C4-5, C5-6 and C6-7 uncovertebral joint spurring bilaterally. No lytic abnormality.   Thoracic spine AP and lateral: T8-9 right-sided osteophytes and to a lesser degree T9-10. No lytic abnormality. Slight multilevel lumbar thoracic disc space narrowing likely degenerative.   Lumbar spine AP and lateral: Disc space narrowing at L4-5 and to a greater extent L5-S1. No lytic disease. L3 through S1 facet sclerosis.   AP pelvis: Negative for lytic disease.   Bilateral upper and lower extremities shoulders through wrist and from the hips through ankle: Negative for lytic disease. Joint space narrowing of the femorotibial compartments both knees. Subchondral cyst of the right patella.   CXR: Clear lungs. Cardiac implantable monitoring device projects over the left heart. Aortic atherosclerosis. No lytic disease.   IMPRESSION: No findings suspicious for lytic disease.     Electronically Signed   By: Alm Medin M.D.   On: 11/28/2016 14:58     ASSESSMENT & PLAN:   83 y.o. 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 (12/2016) Peak M spike 2.6  Cytogenetics and Myeloma FISH panel.- trisomy 11 Patient has normocytic anemia without any other clear etiology. (>2g/dl lower that lower limit of normal which is 13) which per criteria would place this in the Multiple myeloma category as  opposed to Smoldering Multiple myeloma (Borderline criterion)  No overt renal failure or hypercalcemia at this time No focal bone pains though he has significant chronic back pain related to degenerative disc disease. Bone survey shows no overt lytic lesions.  10/16/18 CT Coronary Morph revealed Coronary artery calcium  score 299 Agatston units, this places the patient in the 47th percentile for age and gender, suggesting intermediate risk for future cardiac events. 2. Nonobstructive coronary disease.  12/08/18 Bone Density study revealed that the pt has osteoporosis  2) Anemia and thrombocytopenia -- related to treatment (Revlimid ) -stable  3) Grade 1 Neuropathy/radiculopathy-- in b/l feet and halfway up lower leg without pain  4) Borderline low B12 levels, previously 299 --- on replacement -  -continue replacement.  5) h/o recurrent CVA - thought to be related to small vessel disease as per neurology. Has a small PFO which could be an additional risk factor as well as his afib  6)  P afib Plan -Continue on Xarelto  per cardiology.  7) H/o recurrent SCC -Underwent surgical excision on right forearm with Dr. Mitzie Freund on 07/28/18. His surgical pathology showed evidence of  Squamous Cell Carcinoma. Plan -continue dermatology followup  10) Lower extremity discomfort - improved, stable. Likely Discogenic pain -US  on 12/2017 revealed no blood clot -Pt has established care with orthopedist and PT. - has had muscle cramps from his statins and this is being mx by his cardiologist.  11) Patient Active Problem List   Diagnosis Date Noted   Mild cognitive impairment of uncertain or unknown etiology 07/16/2023   History of squamous cell carcinoma    IPMN (intraductal papillary mucinous neoplasm) 02/20/2022   Osteoporosis 03/10/2019   Multiple myeloma without remission 03/10/2019   Chest pain 09/27/2018   Degeneration of lumbar intervertebral disc 01/30/2018   Low back pain 01/30/2018    Lumbar radiculopathy 01/30/2018   History of loop recorder 06/12/2017   Gait abnormality    Benign essential hypertension    Paroxysmal SVT (supraventricular tachycardia) (HCC)    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  Platelets are also stable at 115k. CMP stable mild hypocalcemia Myeloma panel shows a stable M spike of 0.1 g/dL of IgG lambda monoclonal protein Serum kappa lambda free light chain ratio within normal limits Patient has no new symptoms suggestive of myeloma progression. She continues to have memory issues and is following with neurology and has been on memantine  the dose of which was recently increased. - No notable toxicities from his current maintenance dose of Revlimid  which she will continue at this time. - Follow-up with PCP for age-appropriate cancer screening and vaccinations. - Continue Prolia  every 6 months for osteoporosis.  FOLLOW UP: Return to clinic with Dr. Onesimo in 12 weeks Labs in 11 weeks PLz schedule next 2 doses of Prolia  every 6 months  The total time spent in the appointment was 25 minutes*.  All of the patient's questions were answered with apparent satisfaction. The patient knows to call the clinic with any problems, questions or concerns.   Emaline Onesimo MD MS AAHIVMS [facility] Hematology/Oncology Physician [facility]  .*Total  Encounter Time as defined by the Centers for Medicare and Medicaid Services includes, in addition to the face-to-face time of a patient visit (documented in the note above) non-face-to-face time: obtaining and reviewing outside history, ordering and reviewing medications, tests or procedures, care coordination (communications with other health care professionals or caregivers) and documentation in the medical record.

## 2024-08-09 NOTE — Progress Notes (Incomplete)
 HEMATOLOGY/ONCOLOGY CLINIC VISIT NOTE  Date of Service: .08/03/2024     Patient Care Team: Justin Kins, MD as PCP - General (Family Medicine) Justin Justin BROCKS, MD as PCP - Cardiology (Cardiology)   CHIEF COMPLAINTS:  Follow-up for continued evaluation and management of multiple myeloma  HISTORY OF PRESENTING ILLNESS:  Please see previous note for details of initial presentation  CURRENT THERAPY:   Revlimid  maintenance 5 mg po 3 weeks on 1 week off.   INTERVAL HISTORY:  Justin [name] is a 83 y.o. male who is here for continued evaluation and management of his multiple myeloma.  I last connected with patient on 01/19/2024 via telemedicine visit and complained of cold sores inside his mouth, congestion, and cough with phlegm.  Today, he reports that he is recovering from a mild cold infection, which he has endorsed for the last week. He notes feeling well during the 3 weeks he was in [place]  recently. However, he believes he became sick after being in a crowded airplane on his way home with no mask. He denies any fever. Patient did endorse heavy rhinorrhea initially, which has become more mild. He also reports mild sneezing and mild coughing. Patient has not had swab testing. Patient has been taking Nyquil/Dayquil to manage symptoms.  He has been tolerating his Revlimid  well without any new or severe toxicities.   Patient reports that he recently had some insurance issues which did not allow him to order Revlimid  from his pharmacy. He currently has two tablets of Revlimid  remaining. Patient reports that he has not applied for VA benefits   He complains of memory issues.   He notes that he has been retired for 20 years.   MEDICAL HISTORY:  Past Medical History:  Diagnosis Date  . Acute blood loss anemia   . Arthritis   . Atherosclerosis of native coronary artery of native heart without angina pectoris 03/20/2016  . Benign essential hypertension   . Chest pain  09/27/2018  . Controlled type 2 diabetes mellitus with complication, without long-term current use of insulin  12/07/2014  . Degeneration of lumbar intervertebral disc 01/30/2018  . Gait abnormality   . GERD (gastroesophageal reflux disease)   . Gout   . History of loop recorder   . History of squamous cell carcinoma   . Hyperlipidemia 12/13/2014  . IPMN (intraductal papillary mucinous neoplasm) 02/20/2022  . Left leg weakness   . Low back pain 01/30/2018  . Lumbar radiculopathy 01/30/2018  . Mild cognitive impairment of uncertain or unknown etiology 07/16/2023  . Multiple lacunar infarcts 2017   Bilateral basal ganglia; right frontal white matter    . Multiple myeloma without remission 03/10/2019  . Numbness 06/01/2016  . Osteoporosis 03/10/2019  . 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 Paroxysmal SVT (supraventricular tachycardia) (HCC)    a. s/p RFCA on 05/01/15  . 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 Right arm numbness 06/01/2016  . S/P RF ablation operation for arrhythmia 12/13/2014  . TIA (transient ischemic attack) 06/02/2016    SURGICAL HISTORY: Past Surgical History:  Procedure Laterality Date  . ATRIAL FIBRILLATION ABLATION     Dr. Waddell  . BASAL CELL CARCINOMA EXCISION     off of back  . ELECTROPHYSIOLOGIC STUDY N/A 05/01/2015   Procedure: SVT Ablation;  Surgeon: Justin LELON Waddell, MD;  Location: [facility] INVASIVE CV LAB;  Service: Cardiovascular;  Laterality: N/A;  . EP IMPLANTABLE DEVICE N/A 06/04/2016   Procedure: Loop Recorder Insertion;  Surgeon: Justin Rakers,  MD;  Location: [facility] INVASIVE CV LAB;  Service: Cardiovascular;  Laterality: N/A;  . implantable loop recorder removal     ILR removed by Dr Justin [name]  . INGUINAL HERNIA REPAIR Bilateral 06/16/2017   Procedure: LAPAROSCOPIC BILATERAL INGUINAL HERNIA REPAIR;  Surgeon: Justin Mitzie LABOR, MD;  Location: [facility] OR;  Service: General;  Laterality: Bilateral;  . INSERTION OF MESH Bilateral 06/16/2017   Procedure: INSERTION OF  MESH;  Surgeon: Justin Mitzie LABOR, MD;  Location[facility];  Service: General;  Laterality: Bilateral;  . MASS EXCISION Right 07/28/2018   Procedure: EXCISION OF RIGHT FOREARM MASS;  Surgeon: Justin Mitzie LABOR, MD;  Location: [facility] ORS;  Service: General;  Laterality: Right;  . SQUAMOUS CELL CARCINOMA EXCISION    . TEE WITHOUT CARDIOVERSION N/A 06/04/2016   Procedure: TRANSESOPHAGEAL ECHOCARDIOGRAM (TEE);  Surgeon: Justin JAYSON Maxcy, MD;  Location: [facility] ENDOSCOPY;  Service: Cardiovascular;  Laterality: N/A;    SOCIAL HISTORY: Social History   Socioeconomic History  . Marital status: Married    Spouse name: Not on file  . Number of children: 3  . Years of education: 93  . Highest education level: Bachelor's degree (e.g., BA, AB, BS)  Occupational History  . Occupation: Retired    Comment: Optometrist  . Smoking status: Never  . Smokeless tobacco: Never  Vaping Use  . Vaping status: Never Used  Substance and Sexual Activity  . Alcohol use: No  . Drug use: No  . Sexual activity: Not on file  Other Topics Concern  . Not on file  Social History Narrative   Right handed   No caffeine   Two story home   Retired   Surveyor, quantity   Social Drivers of Corporate investment banker Strain: Not on [employer] Insecurity: Not on file  Transportation Needs: Not on file  Physical Activity: Not on file  Stress: Not on file  Social Connections: Not on file  Intimate Partner Violence: Not on file   Justin [name] is retired. His daughter is Interior and spatial designer of nursing for a nursing home in [place], [place].    FAMILY HISTORY: Family History  Problem Relation Age of Onset  . Stroke Mother   . Hypertension Mother   . Language disorder Mother        Aphasia; unknown cause  . Alzheimer's disease Father   . Heart failure Father   . Dementia Sister        Lewy body  . Down syndrome Daughter     ALLERGIES:  is allergic to other and no known allergies.  MEDICATIONS:  Current Outpatient Medications   Medication Sig Dispense Refill  . B Complex-C (SUPER B-COMPLEX + VITAMIN C PO) Take 1 capsule by mouth daily with supper.    . cyanocobalamin  1000 MCG tablet Take 1,000 mcg by mouth daily.    . denosumab  (PROLIA ) 60 MG/ML SOSY injection Inject 60 mg into the skin every 6 (six) months.    . FREESTYLE LITE test strip     . glucose monitoring kit (FREESTYLE) monitoring kit 1 each by Does not apply route as needed for other.    . Lancets (FREESTYLE) lancets 1 each daily.    . lenalidomide  (REVLIMID ) 5 MG capsule Take 1 capsule (5 mg total) by mouth daily. Take 1 capsule (5 mg total) by mouth daily for 21 days and then take 7 days off. 21 capsule 0  . Magnesium 400 MG CAPS Take 400 mg by mouth daily.    SABRA  memantine  (NAMENDA ) 5 MG tablet Take 1 tablet (5 mg total) by mouth at bedtime. 90 tablet 1  . metFORMIN  (GLUCOPHAGE ) 1000 MG tablet TAKE ONE-HALF (1/2) TABLET DAILY WITH BREAKFAST 90 tablet 3  . nitroGLYCERIN  (NITROSTAT ) 0.4 MG SL tablet Place 1 tablet (0.4 mg total) under the tongue every 5 (five) minutes as needed for chest pain (Max 3 doses in 15 mins.). 30 tablet 0  . omeprazole  (PRILOSEC) 20 MG capsule Take 1 capsule (20 mg total) by mouth every morning. 90 capsule 3  . Potassium Gluconate 80 MG TABS as directed Orally    . pravastatin  (PRAVACHOL ) 10 MG tablet Take 1 tablet (10 mg total) by mouth daily. 90 tablet 3  . pregabalin  (LYRICA ) 75 MG capsule Take 75 mg by mouth 2 (two) times daily.    . Turmeric (QC TUMERIC COMPLEX PO) Take 1 capsule by mouth daily.    . Vitamin D , Ergocalciferol , (DRISDOL ) 1.25 MG (50000 UNIT) CAPS capsule TAKE 1 CAPSULE BY MOUTH 1 TIME A WEEK 12 capsule 5  . XARELTO  20 MG TABS tablet TAKE 1 TABLET DAILY WITH SUPPER 90 tablet 3  . Zinc 50 MG TABS Take by mouth.     No current facility-administered medications for this visit.    REVIEW OF SYSTEMS:    10 Point review of Systems was done is negative except as noted above.   PHYSICAL EXAMINATION:  .BP (!)  115/92   Pulse (!) 51   Temp (!) 97.2 F (36.2 C)   Resp 20   Wt 188 lb 1.6 oz (85.3 kg)   SpO2 99%   BMI 28.60 kg/m   GENERAL:alert, in no acute distress and comfortable SKIN: no acute rashes, no significant lesions EYES: conjunctiva are pink and non-injected, sclera anicteric OROPHARYNX: MMM, no exudates, no oropharyngeal erythema or ulceration NECK: supple, no JVD LYMPH:  no palpable lymphadenopathy in the cervical, axillary or inguinal regions LUNGS: clear to auscultation b/l with normal respiratory effort HEART: regular rate & rhythm ABDOMEN:  normoactive bowel sounds , non tender, not distended. Extremity: no pedal edema PSYCH: alert & oriented x 3 with fluent speech NEURO: no focal motor/sensory deficits   LABORATORY DATA:  I have reviewed the data as listed.     Latest Ref Rng & Units 07/26/2024   11:08 AM 04/15/2024    1:45 PM 01/12/2024    1:03 PM  CBC  WBC 4.0 - 10.5 K/uL 3.1  2.8  3.3   Hemoglobin 13.0 - 17.0 g/dL 87.1  87.7  87.4   Hematocrit 39.0 - 52.0 % 37.9  35.5  35.9   Platelets 150 - 400 K/uL 115  119  105        Latest Ref Rng & Units 07/26/2024   11:08 AM 04/15/2024    1:45 PM 01/12/2024    1:03 PM  CMP  Glucose 70 - 99 mg/dL 876  877  886   BUN 8 - 23 mg/dL 23  20  20    Creatinine 0.61 - 1.24 mg/dL 8.75  8.87  8.74   Sodium 135 - 145 mmol/L 138  141  137   Potassium 3.5 - 5.1 mmol/L 4.4  3.8  4.2   Chloride 98 - 111 mmol/L 104  110  106   CO2 22 - 32 mmol/L 29  27  25    Calcium  8.9 - 10.3 mg/dL 8.5  8.7  8.5   Total Protein 6.5 - 8.1 g/dL 6.4  6.4  6.3   Total  Bilirubin 0.0 - 1.2 mg/dL 0.7  0.8  1.0   Alkaline Phos 38 - 126 U/L 65  55  52   AST 15 - 41 U/L 19  16  19    ALT 0 - 44 U/L 10  8  8          RADIOGRAPHIC STUDIES: I have personally reviewed the radiological images as listed and agreed with the findings in the report.       Bone Marrow Biopsy 12/24/2016 (Accession [id number])  Diagnosis Bone Marrow, Aspirate,Biopsy, and Clot,  left iliac crest - HYPERCELLULAR BONE MARROW FOR AGE WITH PLASMA CELL NEOPLASM. - SEE COMMENT. PERIPHERAL BLOOD: - NORMOCYTIC-NORMOCHROMIC ANEMIA. - LEUKOPENIA.  DG Bone Survey Met (Accession [id number]) (Order [id number])  Imaging  Date: 11/28/2016 Department: [facility]-RADIOLOGY[facility]STIC Released By: Alan LITTIE Ee Authorizing: Emaline Candida Saran, MD  Exam Information   Status Exam Begun  Exam Ended   Final 99 11/28/2016 11:59 AM 11/28/2016 12:36 PM  PACS Images   Show images for DG Bone Survey Met  Study Result   CLINICAL DATA:  Monoclonal gammopathy of unknown significance. History of squamous cell carcinoma excision, basal cell carcinoma excision.   EXAM: METASTATIC BONE SURVEY   COMPARISON:  CT neck, chest, abdomen and pelvis from 10/29/16, MRI of the head from 06/01/2016, CXR 10/27/2016, lumbar spine radiographs 06/24/2016   FINDINGS: Lateral skull: Small occipital lucency which may represent a normal arachnoid granulation posteriorly in the occiput. No definite lytic abnormality.   Cervical spine AP and lateral: Disc space narrowing at C2-3, and from C4 through C7. C4-5, C5-6 and C6-7 uncovertebral joint spurring bilaterally. No lytic abnormality.   Thoracic spine AP and lateral: T8-9 right-sided osteophytes and to a lesser degree T9-10. No lytic abnormality. Slight multilevel lumbar thoracic disc space narrowing likely degenerative.   Lumbar spine AP and lateral: Disc space narrowing at L4-5 and to a greater extent L5-S1. No lytic disease. L3 through S1 facet sclerosis.   AP pelvis: Negative for lytic disease.   Bilateral upper and lower extremities shoulders through wrist and from the hips through ankle: Negative for lytic disease. Joint space narrowing of the femorotibial compartments both knees. Subchondral cyst of the right patella.   CXR: Clear lungs. Cardiac implantable monitoring device projects over the left heart. Aortic  atherosclerosis. No lytic disease.   IMPRESSION: No findings suspicious for lytic disease.     Electronically Signed   By: Alm Medin M.D.   On: 11/28/2016 14:58     ASSESSMENT & PLAN:   83 y.o. 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 (12/2016) Peak M spike 2.6  Cytogenetics and Myeloma FISH panel.- trisomy 11 Patient has normocytic anemia without any other clear etiology. (>2g/dl lower that lower limit of normal which is 13) which per criteria would place this in the Multiple myeloma category as opposed to Smoldering Multiple myeloma (Borderline criterion)  No overt renal failure or hypercalcemia at this time No focal bone pains though he has significant chronic back pain related to degenerative disc disease. Bone survey shows no overt lytic lesions.  10/16/18 CT Coronary Morph revealed Coronary artery calcium  score 299 Agatston units, this places the patient in the 47th percentile for age and gender, suggesting intermediate risk for future cardiac events. 2. Nonobstructive coronary disease.  12/08/18 Bone Density study revealed that the pt has osteoporosis  2) Anemia and thrombocytopenia -- related to  treatment (Revlimid ) -stable  3) Grade 1 Neuropathy/radiculopathy-- in b/l feet and halfway up lower leg without pain  4) Borderline low B12 levels, previously 299 --- on replacement -  -continue replacement.  5) h/o recurrent CVA - thought to be related to small vessel disease as per neurology. Has a small PFO which could be an additional risk factor as well as his afib  6)  P afib Plan -Continue on Xarelto  per cardiology.  7) H/o recurrent SCC -Underwent surgical excision on right forearm with Dr. Mitzie Freund on 07/28/18. His surgical pathology showed evidence of Squamous Cell Carcinoma. Plan -continue dermatology followup  10) Lower extremity discomfort - improved, stable. Likely  Discogenic pain -US  on 12/2017 revealed no blood clot -Pt has established care with orthopedist and PT. - has had muscle cramps from his statins and this is being mx by his cardiologist.  11) Patient Active Problem List   Diagnosis Date Noted  . Mild cognitive impairment of uncertain or unknown etiology 07/16/2023  . History of squamous cell carcinoma   . IPMN (intraductal papillary mucinous neoplasm) 02/20/2022  . Osteoporosis 03/10/2019  . Multiple myeloma without remission 03/10/2019  . Chest pain 09/27/2018  . Degeneration of lumbar intervertebral disc 01/30/2018  . Low back pain 01/30/2018  . Lumbar radiculopathy 01/30/2018  . History of loop recorder 06/12/2017  . Gait abnormality   . Benign essential hypertension   . Paroxysmal SVT (supraventricular tachycardia) (HCC)   . 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 TIA (transient ischemic attack) 06/02/2016  . Right arm numbness 06/01/2016  . Atherosclerosis of native coronary artery of native heart without angina pectoris 03/20/2016  . Multiple lacunar infarcts 2017  . GERD (gastroesophageal reflux disease)   . Gout   . S/P RF ablation operation for arrhythmia 12/13/2014  . Hyperlipidemia 12/13/2014  . Controlled type 2 diabetes mellitus with complication, without long-term current use of insulin  12/07/2014    PLAN:  -his labs have been fairly stable -Discussed lab results from 04/15/2024 in detail with patient. CBC showed WBC of 2.8K, hemoglobin of 12.2, and platelets of 119K. -hgb stable -WBCs borderline low and not concerning -platelets improved from 105K 3 months ago to 119K more recently -myeloma lab from 04/15/2024 shows M protein of 0.2 g/dL -K/L light chains WNL with normal ratio -CMP shows normal calcium  levels and improved kidney function. There are no other major issues on blood chemistries -recommend trying to connect with VA system to see if he receives any Veteran  benefits -Patient has been tolerating his current Revlimid  dosage well without any new or severe toxicities.   -okay to delay treatment by one week to allow him to heal completely. If there is some mild delay in receiving Revlimid  treatment during this time, it would not be concerning -discussed option of swab testing though if his infection is nearly resolved, swab testing results may not effect his treatment  -Recommend to continue B-Complex and Vitamin-D supplement.  -Continue Prolia .  -answered all of patient's questions in detail  FOLLOW UP: Return to clinic with Dr. Onesimo in 12 weeks Labs in 11 weeks PLz schedule next 2 doses of Prolia  every 6 months  The total time spent in the appointment was 23 minutes* .  All of the patient's questions were answered with apparent satisfaction. The patient knows to call the clinic with any problems, questions or concerns.   Emaline Onesimo MD MS AAHIVMS [facility] Hematology/Oncology Physician [facility]  .*Total Encounter Time as defined by the Centers for Medicare and Medicaid Services includes, in addition to the face-to-face time of a patient visit (documented in the note above) non-face-to-face time: obtaining and reviewing outside history, ordering and reviewing medications, tests or procedures, care coordination (communications with other health care professionals or caregivers) and documentation in the medical record.    I,Mitra Faeizi,acting as a Neurosurgeon for Emaline Saran, MD.,have documented all relevant documentation on the behalf of Emaline Saran, MD,as directed by  Emaline Saran, MD while in the presence of Emaline Saran, MD.  .I have reviewed the above documentation for accuracy and completeness, and I agree with the above. .Abrianna Sidman Kishore Youssef Footman MD

## 2024-08-10 ENCOUNTER — Encounter: Payer: Self-pay | Admitting: Hematology

## 2024-08-16 DIAGNOSIS — M25512 Pain in left shoulder: Secondary | ICD-10-CM | POA: Diagnosis not present

## 2024-08-16 DIAGNOSIS — M542 Cervicalgia: Secondary | ICD-10-CM | POA: Diagnosis not present

## 2024-08-26 ENCOUNTER — Other Ambulatory Visit: Payer: Self-pay

## 2024-08-26 DIAGNOSIS — C9000 Multiple myeloma not having achieved remission: Secondary | ICD-10-CM

## 2024-08-26 MED ORDER — LENALIDOMIDE 5 MG PO CAPS: 5.0000 mg | ORAL_CAPSULE | Freq: Every day | ORAL | 0 refills | Status: DC

## 2024-09-02 DIAGNOSIS — D485 Neoplasm of uncertain behavior of skin: Secondary | ICD-10-CM | POA: Diagnosis not present

## 2024-09-02 DIAGNOSIS — D044 Carcinoma in situ of skin of scalp and neck: Secondary | ICD-10-CM | POA: Diagnosis not present

## 2024-09-02 DIAGNOSIS — D0461 Carcinoma in situ of skin of right upper limb, including shoulder: Secondary | ICD-10-CM | POA: Diagnosis not present

## 2024-09-22 ENCOUNTER — Other Ambulatory Visit: Payer: Self-pay | Admitting: Physician Assistant

## 2024-09-22 DIAGNOSIS — C4442 Squamous cell carcinoma of skin of scalp and neck: Secondary | ICD-10-CM | POA: Diagnosis not present

## 2024-09-23 ENCOUNTER — Other Ambulatory Visit: Payer: Self-pay

## 2024-09-23 DIAGNOSIS — C9000 Multiple myeloma not having achieved remission: Secondary | ICD-10-CM

## 2024-09-23 MED ORDER — LENALIDOMIDE 5 MG PO CAPS: 5.0000 mg | ORAL_CAPSULE | Freq: Every day | ORAL | 0 refills | Status: DC

## 2024-10-07 DIAGNOSIS — D0461 Carcinoma in situ of skin of right upper limb, including shoulder: Secondary | ICD-10-CM | POA: Diagnosis not present

## 2024-10-13 DIAGNOSIS — M1711 Unilateral primary osteoarthritis, right knee: Secondary | ICD-10-CM | POA: Diagnosis not present

## 2024-10-13 DIAGNOSIS — M25561 Pain in right knee: Secondary | ICD-10-CM | POA: Diagnosis not present

## 2024-10-13 DIAGNOSIS — E1169 Type 2 diabetes mellitus with other specified complication: Secondary | ICD-10-CM | POA: Diagnosis not present

## 2024-10-13 DIAGNOSIS — M25562 Pain in left knee: Secondary | ICD-10-CM | POA: Diagnosis not present

## 2024-10-13 DIAGNOSIS — C9000 Multiple myeloma not having achieved remission: Secondary | ICD-10-CM | POA: Diagnosis not present

## 2024-10-18 ENCOUNTER — Other Ambulatory Visit: Payer: Self-pay

## 2024-10-18 DIAGNOSIS — C9000 Multiple myeloma not having achieved remission: Secondary | ICD-10-CM

## 2024-10-19 ENCOUNTER — Inpatient Hospital Stay: Attending: Hematology

## 2024-10-19 DIAGNOSIS — Z4802 Encounter for removal of sutures: Secondary | ICD-10-CM | POA: Diagnosis not present

## 2024-10-19 DIAGNOSIS — D225 Melanocytic nevi of trunk: Secondary | ICD-10-CM | POA: Diagnosis not present

## 2024-10-19 DIAGNOSIS — C9000 Multiple myeloma not having achieved remission: Secondary | ICD-10-CM | POA: Insufficient documentation

## 2024-10-19 DIAGNOSIS — Z86018 Personal history of other benign neoplasm: Secondary | ICD-10-CM | POA: Diagnosis not present

## 2024-10-19 DIAGNOSIS — L729 Follicular cyst of the skin and subcutaneous tissue, unspecified: Secondary | ICD-10-CM | POA: Diagnosis not present

## 2024-10-19 DIAGNOSIS — L570 Actinic keratosis: Secondary | ICD-10-CM | POA: Diagnosis not present

## 2024-10-19 DIAGNOSIS — Z85828 Personal history of other malignant neoplasm of skin: Secondary | ICD-10-CM | POA: Diagnosis not present

## 2024-10-19 DIAGNOSIS — L821 Other seborrheic keratosis: Secondary | ICD-10-CM | POA: Diagnosis not present

## 2024-10-19 DIAGNOSIS — L814 Other melanin hyperpigmentation: Secondary | ICD-10-CM | POA: Diagnosis not present

## 2024-10-19 LAB — CBC WITH DIFFERENTIAL (CANCER CENTER ONLY)
Abs Immature Granulocytes: 0.01 K/uL (ref 0.00–0.07)
Basophils Absolute: 0.1 K/uL (ref 0.0–0.1)
Basophils Relative: 1 %
Eosinophils Absolute: 0.3 K/uL (ref 0.0–0.5)
Eosinophils Relative: 5 %
HCT: 38.7 % — ABNORMAL LOW (ref 39.0–52.0)
Hemoglobin: 13.4 g/dL (ref 13.0–17.0)
Immature Granulocytes: 0 %
Lymphocytes Relative: 15 %
Lymphs Abs: 0.7 K/uL (ref 0.7–4.0)
MCH: 30.5 pg (ref 26.0–34.0)
MCHC: 34.6 g/dL (ref 30.0–36.0)
MCV: 88.2 fL (ref 80.0–100.0)
Monocytes Absolute: 0.6 K/uL (ref 0.1–1.0)
Monocytes Relative: 14 %
Neutro Abs: 3 K/uL (ref 1.7–7.7)
Neutrophils Relative %: 65 %
Platelet Count: 106 K/uL — ABNORMAL LOW (ref 150–400)
RBC: 4.39 MIL/uL (ref 4.22–5.81)
RDW: 14.2 % (ref 11.5–15.5)
WBC Count: 4.7 K/uL (ref 4.0–10.5)
nRBC: 0 % (ref 0.0–0.2)

## 2024-10-19 LAB — CMP (CANCER CENTER ONLY)
ALT: 11 U/L (ref 0–44)
AST: 21 U/L (ref 15–41)
Albumin: 4 g/dL (ref 3.5–5.0)
Alkaline Phosphatase: 64 U/L (ref 38–126)
Anion gap: 8 (ref 5–15)
BUN: 22 mg/dL (ref 8–23)
CO2: 26 mmol/L (ref 22–32)
Calcium: 9.4 mg/dL (ref 8.9–10.3)
Chloride: 103 mmol/L (ref 98–111)
Creatinine: 1.22 mg/dL (ref 0.61–1.24)
GFR, Estimated: 58 mL/min — ABNORMAL LOW (ref >60)
Glucose, Bld: 120 mg/dL — ABNORMAL HIGH (ref 70–99)
Potassium: 4.2 mmol/L (ref 3.5–5.1)
Sodium: 137 mmol/L (ref 135–145)
Total Bilirubin: 0.7 mg/dL (ref 0.0–1.2)
Total Protein: 6.7 g/dL (ref 6.5–8.1)

## 2024-10-20 DIAGNOSIS — Z4889 Encounter for other specified surgical aftercare: Secondary | ICD-10-CM | POA: Diagnosis not present

## 2024-10-20 DIAGNOSIS — L821 Other seborrheic keratosis: Secondary | ICD-10-CM | POA: Diagnosis not present

## 2024-10-20 DIAGNOSIS — L308 Other specified dermatitis: Secondary | ICD-10-CM | POA: Diagnosis not present

## 2024-10-20 DIAGNOSIS — D485 Neoplasm of uncertain behavior of skin: Secondary | ICD-10-CM | POA: Diagnosis not present

## 2024-10-20 LAB — KAPPA/LAMBDA LIGHT CHAINS
Kappa free light chain: 21.2 mg/L — ABNORMAL HIGH (ref 3.3–19.4)
Kappa, lambda light chain ratio: 0.98 (ref 0.26–1.65)
Lambda free light chains: 21.6 mg/L (ref 5.7–26.3)

## 2024-10-21 ENCOUNTER — Other Ambulatory Visit: Payer: Self-pay

## 2024-10-21 DIAGNOSIS — C9000 Multiple myeloma not having achieved remission: Secondary | ICD-10-CM

## 2024-10-21 MED ORDER — LENALIDOMIDE 5 MG PO CAPS: 5.0000 mg | ORAL_CAPSULE | Freq: Every day | ORAL | 0 refills | Status: DC

## 2024-10-25 LAB — MULTIPLE MYELOMA PANEL, SERUM
Albumin SerPl Elph-Mcnc: 3.5 g/dL (ref 2.9–4.4)
Albumin/Glob SerPl: 1.4 (ref 0.7–1.7)
Alpha 1: 0.2 g/dL (ref 0.0–0.4)
Alpha2 Glob SerPl Elph-Mcnc: 0.7 g/dL (ref 0.4–1.0)
B-Globulin SerPl Elph-Mcnc: 0.9 g/dL (ref 0.7–1.3)
Gamma Glob SerPl Elph-Mcnc: 0.7 g/dL (ref 0.4–1.8)
Globulin, Total: 2.6 g/dL (ref 2.2–3.9)
IgA: 86 mg/dL (ref 61–437)
IgG (Immunoglobin G), Serum: 881 mg/dL (ref 603–1613)
IgM (Immunoglobulin M), Srm: 25 mg/dL (ref 15–143)
M Protein SerPl Elph-Mcnc: 0.1 g/dL — ABNORMAL HIGH
Total Protein ELP: 6.1 g/dL (ref 6.0–8.5)

## 2024-11-01 ENCOUNTER — Inpatient Hospital Stay: Attending: Hematology | Admitting: Hematology

## 2024-11-01 VITALS — BP 135/61 | HR 56 | Temp 97.5°F | Resp 18 | Wt 180.7 lb

## 2024-11-01 DIAGNOSIS — C9000 Multiple myeloma not having achieved remission: Secondary | ICD-10-CM | POA: Insufficient documentation

## 2024-11-01 DIAGNOSIS — M810 Age-related osteoporosis without current pathological fracture: Secondary | ICD-10-CM | POA: Diagnosis not present

## 2024-11-01 NOTE — Progress Notes (Signed)
 HEMATOLOGY ONCOLOGY PROGRESS NOTE  Date of service: 11/01/2024  Patient Care Team: Loreli Kins, MD as PCP - General (Family Medicine) Mona Vinie BROCKS, MD as PCP - Cardiology (Cardiology)  CHIEF COMPLAINT/PURPOSE OF CONSULTATION: Follow-up for continued evaluation and management of multiple myeloma.  HISTORY OF PRESENTING ILLNESS: (11/20/2016) Justin [name] is a wonderful 83 y.o. 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   Patient has a history of hypertension, diabetes, GERD, gout, squamous cell skin cancers, elevated PSA, SVT status post ablation in May 2016, chronic back pain who has a history of recurrent CVA/TIA's. He apparently had a left hemispheric TIA June 2017 and has had recurrent) subcortical infarcts thought to be related to small vessel disease. He was noted to have a small PFO that was of questionable significance. He was initially on aspirin  and then continued and aspirin  + plavix  for secondary stroke prevention. He follows with Dr. Eather Popp for his neurology cares.   An SPEP was done due to elevated total proteins of 8.9 and showed an M spike of 2 g/dL. He was also noted to have an elevated total protein of 8.6 in April 2017. He was referred to us  for further evaluation of his M spike. Blood test did not reveal any hypercalcemia, no significant renal failure. He has been noted to have developed anemia since June the serum and his hemoglobin was 12.6 and is now down to the mid 10 range. He notes no focal bone pains at this time. Overt acute weight loss. No fevers no chills no night sweats.  SUMMARY OF ONCOLOGIC HISTORY: Oncology History  Multiple myeloma without remission  03/10/2019 Initial Diagnosis   Multiple myeloma without remission   01/19/2024 Cancer Staging   Staging form: Plasma Cell Myeloma and Plasma Cell Disorders, AJCC 8th Edition - Clinical: High-risk cytogenetics: Absent, LDH: Normal -  Signed by Onesimo Emaline Brink, MD on 01/19/2024 Stage prefix: Initial diagnosis Cytogenetics: Other mutation    CURRENT THERAPY:   Revlimid  maintenance 5 mg po 3 weeks on 1 week off.  INTERVAL HISTORY:  Justin [name] is a 83 y.o. male who is here today for continued evaluation and management of multiple myeloma.   he was last seen by me on 08/03/2024; at the time he mentioned experiencing intermittent back pain and muscle cramping from his degenerative disc disease, neuropathy, and persisting memory issues being followed by Neurology.  Today, he says that he is is doing well and does not have any concerns, though he queries about whether his Revlimid  could contribute to erectile dysfunction. Denies any SOB.  He notes that he plans on traveling for the holidays.   REVIEW OF SYSTEMS:   10 Point review of systems of done and is negative except as noted above.  MEDICAL HISTORY Past Medical History:  Diagnosis Date   Acute blood loss anemia    Arthritis    Atherosclerosis of native coronary artery of native heart without angina pectoris 03/20/2016   Benign essential hypertension    Chest pain 09/27/2018   Controlled type 2 diabetes mellitus with complication, without long-term current use of insulin  12/07/2014   Degeneration of lumbar intervertebral disc 01/30/2018   Gait abnormality    GERD (gastroesophageal reflux disease)    Gout    History of loop recorder    History of squamous cell carcinoma    Hyperlipidemia 12/13/2014   IPMN (intraductal papillary mucinous neoplasm) 02/20/2022   Left leg weakness  Low back pain 01/30/2018   Lumbar radiculopathy 01/30/2018   Mild cognitive impairment of uncertain or unknown etiology 07/16/2023   Multiple lacunar infarcts 2017   Bilateral basal ganglia; right frontal white matter     Multiple myeloma without remission 03/10/2019   Numbness 06/01/2016   Osteoporosis 03/10/2019   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   Paroxysmal SVT  (supraventricular tachycardia)    a. 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 Waddell   BASAL CELL CARCINOMA EXCISION     off of back   ELECTROPHYSIOLOGIC STUDY N/A 05/01/2015   Procedure: SVT Ablation;  Surgeon: Danelle LELON Waddell, MD;  Location: [facility] INVASIVE CV LAB;  Service: Cardiovascular;  Laterality: N/A;   EP IMPLANTABLE DEVICE N/A 06/04/2016   Procedure: Loop Recorder Insertion;  Surgeon: Lynwood Rakers, MD;  Location: [facility] INVASIVE CV LAB;  Service: Cardiovascular;  Laterality: N/A;   implantable loop recorder removal     ILR removed by Dr Rakers   INGUINAL HERNIA REPAIR Bilateral 06/16/2017   Procedure: LAPAROSCOPIC BILATERAL INGUINAL HERNIA REPAIR;  Surgeon: Signe Mitzie LABOR, MD;  Location: [facility] OR;  Service: General;  Laterality: Bilateral;   INSERTION OF MESH Bilateral 06/16/2017   Procedure: INSERTION OF MESH;  Surgeon: Signe Mitzie LABOR, MD;  Location: [facility] OR;  Service: General;  Laterality: Bilateral;   MASS EXCISION Right 07/28/2018   Procedure: EXCISION OF RIGHT FOREARM MASS;  Surgeon: Signe Mitzie LABOR, MD;  Location: [facility] ORS;  Service: General;  Laterality: Right;   SQUAMOUS CELL CARCINOMA EXCISION     TEE WITHOUT CARDIOVERSION N/A 06/04/2016   Procedure: TRANSESOPHAGEAL ECHOCARDIOGRAM (TEE);  Surgeon: Vinie JAYSON Maxcy, MD;  Location: [facility] ENDOSCOPY;  Service: Cardiovascular;  Laterality: N/A;    SOCIAL HISTORY Social History   Tobacco Use   Smoking status: Never   Smokeless tobacco: Never  Vaping Use   Vaping status: Never Used  Substance Use Topics   Alcohol use: No   Drug use: No    Social History   Social History Narrative   Right handed   No caffeine   Two story home   Retired   Surveyor, quantity     SOCIAL DRIVERS OF HEALTH SDOH Screenings   Tobacco Use: Low Risk  (07/12/2024)     FAMILY HISTORY Family History  Problem Relation Age of Onset   Stroke Mother    Hypertension Mother    Language disorder Mother        Aphasia; unknown cause   Alzheimer's disease Father    Heart failure Father    Dementia Sister        Lewy body   Down syndrome Daughter      ALLERGIES: is allergic to other and no known allergies.  MEDICATIONS  Current Outpatient Medications  Medication Sig Dispense Refill   B Complex-C (SUPER B-COMPLEX + VITAMIN C PO) Take 1 capsule by mouth daily with supper.     cyanocobalamin  1000 MCG tablet Take 1,000 mcg by mouth daily.     denosumab  (PROLIA ) 60 MG/ML SOSY injection Inject 60 mg into the skin every 6 (six) months.     FREESTYLE LITE test strip      glucose monitoring kit (FREESTYLE) monitoring kit 1 each by Does  not apply route as needed for other.     Lancets (FREESTYLE) lancets 1 each daily.     lenalidomide  (REVLIMID ) 5 MG capsule Take 1 capsule (5 mg total) by mouth daily. Take 1 capsule (5 mg total) by mouth daily for 21 days and then take 7 days off. 21 capsule 0   Magnesium 400 MG CAPS Take 400 mg by mouth daily.     memantine  (NAMENDA ) 5 MG tablet Take 1 tablet (5 mg total) by mouth at bedtime. 90 tablet 1   metFORMIN  (GLUCOPHAGE ) 1000 MG tablet TAKE ONE-HALF (1/2) TABLET DAILY WITH BREAKFAST 90 tablet 3   nitroGLYCERIN  (NITROSTAT ) 0.4 MG SL tablet Place 1 tablet (0.4 mg total) under the tongue every 5 (five) minutes as needed for chest pain (Max 3 doses in 15 mins.). 30 tablet 0   omeprazole  (PRILOSEC) 20 MG capsule Take 1 capsule (20 mg total) by mouth every morning. 90 capsule 3   Potassium Gluconate 80 MG TABS as directed Orally     pravastatin  (PRAVACHOL ) 10 MG tablet Take 1 tablet (10 mg total) by mouth daily. 90 tablet 3   pregabalin  (LYRICA ) 75 MG capsule Take 75 mg by mouth 2 (two) times daily.     Turmeric (QC TUMERIC COMPLEX PO)  Take 1 capsule by mouth daily.     Vitamin D , Ergocalciferol , (DRISDOL ) 1.25 MG (50000 UNIT) CAPS capsule TAKE 1 CAPSULE BY MOUTH 1 TIME A WEEK 12 capsule 5   XARELTO  20 MG TABS tablet TAKE 1 TABLET DAILY WITH SUPPER 90 tablet 3   Zinc 50 MG TABS Take by mouth.     No current facility-administered medications for this visit.    PHYSICAL EXAMINATION: ECOG PERFORMANCE STATUS: 1 - Symptomatic but completely ambulatory VITALS: Vitals:   11/01/24 1110  BP: 135/61  Pulse: (!) 56  Resp: 18  Temp: (!) 97.5 F (36.4 C)  SpO2: 100%   Filed Weights   11/01/24 1110  Weight: 180 lb 11.2 oz (82 kg)   Body mass index is 27.48 kg/m.  GENERAL: alert, in no acute distress and comfortable SKIN: no acute rashes, no significant lesions EYES: conjunctiva are pink and non-injected, sclera anicteric OROPHARYNX: MMM, no exudates, no oropharyngeal erythema or ulceration NECK: supple, no JVD LYMPH:  no palpable lymphadenopathy in the cervical, axillary or inguinal regions LUNGS: clear to auscultation b/l with normal respiratory effort HEART: regular rate & rhythm ABDOMEN:  normoactive bowel sounds , non tender, not distended, no hepatosplenomegaly Extremity: no pedal edema PSYCH: alert & oriented x 3 with fluent speech NEURO: no focal motor/sensory deficits  LABORATORY DATA:   I have reviewed the data as listed     Latest Ref Rng & Units 10/19/2024   10:57 AM 07/26/2024   11:08 AM 04/15/2024    1:45 PM  CBC EXTENDED  WBC 4.0 - 10.5 K/uL 4.7  3.1  2.8   RBC 4.22 - 5.81 MIL/uL 4.39  4.22  3.98   Hemoglobin 13.0 - 17.0 g/dL 86.5  87.1  87.7   HCT 39.0 - 52.0 % 38.7  37.9  35.5   Platelets 150 - 400 K/uL 106  115  119   NEUT# 1.7 - 7.7 K/uL 3.0  1.5  1.5   Lymph# 0.7 - 4.0 K/uL 0.7  0.8  0.9       Latest Ref Rng & Units 10/19/2024   10:57 AM 07/26/2024   11:08 AM 04/15/2024    1:45 PM  CMP  Glucose 70 -  99 mg/dL 879  876  877   BUN 8 - 23 mg/dL 22  23  20    Creatinine 0.61 - 1.24 mg/dL  8.77  8.75  8.87   Sodium 135 - 145 mmol/L 137  138  141   Potassium 3.5 - 5.1 mmol/L 4.2  4.4  3.8   Chloride 98 - 111 mmol/L 103  104  110   CO2 22 - 32 mmol/L 26  29  27    Calcium  8.9 - 10.3 mg/dL 9.4  8.5  8.7   Total Protein 6.5 - 8.1 g/dL 6.7  6.4  6.4   Total Bilirubin 0.0 - 1.2 mg/dL 0.7  0.7  0.8   Alkaline Phos 38 - 126 U/L 64  65  55   AST 15 - 41 U/L 21  19  16    ALT 0 - 44 U/L 11  10  8     MULTIPLE MYELOMA & KAPPA/LAMBDA LIGHT CHAINS 04/2023 - 09/2024         RADIOGRAPHIC STUDIES: I have personally reviewed the radiological images as listed and agreed with the findings in the report.            Bone Marrow Biopsy 12/24/2016 (Accession [id number])  Diagnosis Bone Marrow, Aspirate,Biopsy, and Clot, left iliac crest - HYPERCELLULAR BONE MARROW FOR AGE WITH PLASMA CELL NEOPLASM. - SEE COMMENT. PERIPHERAL BLOOD: - NORMOCYTIC-NORMOCHROMIC ANEMIA. - LEUKOPENIA.   DG Bone Survey Met (Accession [id number]) (Order [id number])  CLINICAL DATA:  Monoclonal gammopathy of unknown significance. History of squamous cell carcinoma excision, basal cell carcinoma excision.   EXAM: METASTATIC BONE SURVEY   COMPARISON:  CT neck, chest, abdomen and pelvis from 10/29/16, MRI of the head from 06/01/2016, CXR 10/27/2016, lumbar spine radiographs 06/24/2016   FINDINGS: Lateral skull: Small occipital lucency which may represent a normal arachnoid granulation posteriorly in the occiput. No definite lytic abnormality.   Cervical spine AP and lateral: Disc space narrowing at C2-3, and from C4 through C7. C4-5, C5-6 and C6-7 uncovertebral joint spurring bilaterally. No lytic abnormality.   Thoracic spine AP and lateral: T8-9 right-sided osteophytes and to a lesser degree T9-10. No lytic abnormality. Slight multilevel lumbar thoracic disc space narrowing likely degenerative.   Lumbar spine AP and lateral: Disc space narrowing at L4-5 and to a greater extent L5-S1. No lytic disease.  L3 through S1 facet sclerosis.   AP pelvis: Negative for lytic disease.   Bilateral upper and lower extremities shoulders through wrist and from the hips through ankle: Negative for lytic disease. Joint space narrowing of the femorotibial compartments both knees. Subchondral cyst of the right patella.   CXR: Clear lungs. Cardiac implantable monitoring device projects over the left heart. Aortic atherosclerosis. No lytic disease.   IMPRESSION: No findings suspicious for lytic disease.   RADIOGRAPHIC STUDIES: I have personally reviewed the radiological images as listed and agreed with the findings in the report. No results found.   ASSESSMENT & PLAN:  83 y.o. 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 (12/2016) Peak M spike 2.6  Cytogenetics and Myeloma FISH panel.- trisomy 11 Patient has normocytic anemia without any other clear etiology. (>2g/dl lower that lower limit of normal which is 13) which per criteria would place this in the Multiple myeloma category as opposed to Smoldering Multiple myeloma (Borderline criterion)   No overt renal failure or hypercalcemia at this time No focal bone pains though he  has significant chronic back pain related to degenerative disc disease. Bone survey shows no overt lytic lesions.   10/16/18 CT Coronary Morph revealed Coronary artery calcium  score 299 Agatston units, this places the patient in the 47th percentile for age and gender, suggesting intermediate risk for future cardiac events. 2. Nonobstructive coronary disease.   12/08/18 Bone Density study revealed that the pt has osteoporosis   2) Anemia and thrombocytopenia -- related to treatment (Revlimid ) -stable   3) Grade 1 Neuropathy/radiculopathy-- in b/l feet and halfway up lower leg without pain   4) Borderline low B12 levels, previously 299 --- on replacement -  -continue replacement.   5) h/o recurrent  CVA - thought to be related to small vessel disease as per neurology. Has a small PFO which could be an additional risk factor as well as his afib   6)  P afib Plan -Continue on Xarelto  per cardiology.   7) H/o recurrent SCC -Underwent surgical excision on right forearm with Dr. Mitzie Freund on 07/28/18. His surgical pathology showed evidence of Squamous Cell Carcinoma. Plan -continue dermatology followup   10) Lower extremity discomfort - improved, stable. Likely Discogenic pain -US  on 12/2017 revealed no blood clot -Pt has established care with orthopedist and PT. - has had muscle cramps from his statins and this is being mx by his cardiologist.   11) Patient Active Problem List   Diagnosis Date Noted   Mild cognitive impairment of uncertain or unknown etiology 07/16/2023   History of squamous cell carcinoma    IPMN (intraductal papillary mucinous neoplasm) 02/20/2022   Osteoporosis 03/10/2019   Multiple myeloma without remission 03/10/2019   Chest pain 09/27/2018   Degeneration of lumbar intervertebral disc 01/30/2018   Low back pain 01/30/2018   Lumbar radiculopathy 01/30/2018   History of loop recorder 06/12/2017   Gait abnormality    Benign essential hypertension    Paroxysmal SVT (supraventricular tachycardia)    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   TIA (transient ischemic attack) 06/02/2016   Right arm numbness 06/01/2016   Atherosclerosis of native coronary artery of native heart without angina pectoris 03/20/2016   Multiple lacunar infarcts 2017   GERD (gastroesophageal reflux disease)    Gout    S/P RF ablation operation for arrhythmia 12/13/2014   Hyperlipidemia 12/13/2014   Controlled type 2 diabetes mellitus with complication, without long-term current use of insulin  12/07/2014    PLAN: - Discussed lab results on 11/01/2024 in detail with patient: CBC showed WBC of 4.7K, Hemoglobin of 13.4, and PLTs of 106K decreased from  115K CMP stable.  Myeloma panel with M protein 0.1  Kappa/Lambda Lights Chains stable  - no lab or clinical evidence of myeloma progression at this time. - Continue Revlimid  5 mg 3 weeks on, one week off.   - Vaccine counseling provided: update age-recommended vaccinations.   - Discussed with him that his ED could be multifactorial.  FOLLOW-UP  Return to clinic with Dr. Onesimo in 12 weeks Labs in 11 weeks PLz schedule next 2 doses of Prolia  every 6 months  The total time spent in the appointment was 23 minutes* .  All of the patient's questions were answered and the patient knows to call the clinic with any problems, questions, or concerns.  Emaline Onesimo MD MS AAHIVMS [facility] Hematology/Oncology Physician [facility]  *Total Encounter Time as defined by the Centers for Medicare and Medicaid Services includes, in addition to the face-to-face time  of a patient visit (documented in the note above) non-face-to-face time: obtaining and reviewing outside history, ordering and reviewing medications, tests or procedures, care coordination (communications with other health care professionals or caregivers) and documentation in the medical record.  I,Emily Lagle,acting as a neurosurgeon for Emaline Saran, MD.,have documented all relevant documentation on the behalf of Emaline Saran, MD,as directed by  Emaline Saran, MD while in the presence of Emaline Saran, MD.  I have reviewed the above documentation for accuracy and completeness, and I agree with the above.  Javarri Segal, MD

## 2024-11-05 ENCOUNTER — Inpatient Hospital Stay: Payer: Medicare Other

## 2024-11-05 VITALS — BP 123/69 | HR 63 | Temp 98.0°F | Resp 18

## 2024-11-05 DIAGNOSIS — C9000 Multiple myeloma not having achieved remission: Secondary | ICD-10-CM | POA: Diagnosis not present

## 2024-11-05 DIAGNOSIS — M810 Age-related osteoporosis without current pathological fracture: Secondary | ICD-10-CM | POA: Diagnosis not present

## 2024-11-05 DIAGNOSIS — M818 Other osteoporosis without current pathological fracture: Secondary | ICD-10-CM

## 2024-11-05 MED ORDER — DENOSUMAB 60 MG/ML ~~LOC~~ SOSY
60.0000 mg | PREFILLED_SYRINGE | Freq: Once | SUBCUTANEOUS | Status: AC
  Administered 2024-11-05: 60 mg via SUBCUTANEOUS
  Filled 2024-11-05: qty 1

## 2024-11-07 ENCOUNTER — Encounter: Payer: Self-pay | Admitting: Hematology

## 2024-11-16 ENCOUNTER — Other Ambulatory Visit: Payer: Self-pay | Admitting: Hematology

## 2024-11-16 DIAGNOSIS — C9000 Multiple myeloma not having achieved remission: Secondary | ICD-10-CM

## 2024-12-01 ENCOUNTER — Ambulatory Visit: Payer: Medicare Other | Admitting: Psychology

## 2024-12-01 ENCOUNTER — Encounter: Payer: Self-pay | Admitting: Psychology

## 2024-12-01 ENCOUNTER — Ambulatory Visit: Payer: Self-pay

## 2024-12-01 DIAGNOSIS — D6869 Other thrombophilia: Secondary | ICD-10-CM | POA: Insufficient documentation

## 2024-12-01 DIAGNOSIS — I4891 Unspecified atrial fibrillation: Secondary | ICD-10-CM | POA: Insufficient documentation

## 2024-12-01 DIAGNOSIS — D692 Other nonthrombocytopenic purpura: Secondary | ICD-10-CM | POA: Insufficient documentation

## 2024-12-01 DIAGNOSIS — R0789 Other chest pain: Secondary | ICD-10-CM | POA: Insufficient documentation

## 2024-12-01 DIAGNOSIS — E119 Type 2 diabetes mellitus without complications: Secondary | ICD-10-CM | POA: Insufficient documentation

## 2024-12-01 DIAGNOSIS — E7800 Pure hypercholesterolemia, unspecified: Secondary | ICD-10-CM | POA: Insufficient documentation

## 2024-12-01 DIAGNOSIS — I999 Unspecified disorder of circulatory system: Secondary | ICD-10-CM | POA: Diagnosis not present

## 2024-12-01 DIAGNOSIS — Z85828 Personal history of other malignant neoplasm of skin: Secondary | ICD-10-CM | POA: Insufficient documentation

## 2024-12-01 DIAGNOSIS — R4189 Other symptoms and signs involving cognitive functions and awareness: Secondary | ICD-10-CM

## 2024-12-01 DIAGNOSIS — L814 Other melanin hyperpigmentation: Secondary | ICD-10-CM | POA: Insufficient documentation

## 2024-12-01 DIAGNOSIS — D509 Iron deficiency anemia, unspecified: Secondary | ICD-10-CM | POA: Insufficient documentation

## 2024-12-01 DIAGNOSIS — K862 Cyst of pancreas: Secondary | ICD-10-CM | POA: Insufficient documentation

## 2024-12-01 DIAGNOSIS — N528 Other male erectile dysfunction: Secondary | ICD-10-CM | POA: Insufficient documentation

## 2024-12-01 DIAGNOSIS — I679 Cerebrovascular disease, unspecified: Secondary | ICD-10-CM | POA: Insufficient documentation

## 2024-12-01 DIAGNOSIS — L719 Rosacea, unspecified: Secondary | ICD-10-CM | POA: Insufficient documentation

## 2024-12-01 DIAGNOSIS — F0670 Mild neurocognitive disorder due to known physiological condition without behavioral disturbance: Secondary | ICD-10-CM

## 2024-12-01 DIAGNOSIS — C4442 Squamous cell carcinoma of skin of scalp and neck: Secondary | ICD-10-CM | POA: Insufficient documentation

## 2024-12-01 DIAGNOSIS — Q619 Cystic kidney disease, unspecified: Secondary | ICD-10-CM | POA: Insufficient documentation

## 2024-12-01 DIAGNOSIS — E559 Vitamin D deficiency, unspecified: Secondary | ICD-10-CM | POA: Insufficient documentation

## 2024-12-01 DIAGNOSIS — K5730 Diverticulosis of large intestine without perforation or abscess without bleeding: Secondary | ICD-10-CM | POA: Insufficient documentation

## 2024-12-01 DIAGNOSIS — L821 Other seborrheic keratosis: Secondary | ICD-10-CM | POA: Insufficient documentation

## 2024-12-01 NOTE — Progress Notes (Addendum)
° °  Psychometrician Note   Cognitive testing was administered to Justin [name] by Evalene Pizza, B.S. (psychometrist) under the supervision of Dr. Arthea KYM [name], Ph.D., ABPP, licensed psychologist on 12/01/2024. Justin [name] did not appear overtly distressed by the testing session per behavioral observation or responses across self-report questionnaires. Rest breaks were offered.   The battery of tests administered was selected by Dr. Zachary C. Merz, Ph.D., ABPP with consideration to Justin [name]'s current level of functioning, the nature of his symptoms, emotional and behavioral responses during interview, level of literacy, observed level of motivation/effort, and the nature of the referral question. This battery was communicated to the psychometrist. Communication between Dr. Arthea KYM [name], Ph.D., ABPP and the psychometrist was ongoing throughout the evaluation and Dr. Arthea KYM [name], Ph.D., ABPP was immediately accessible at all times. Dr. Zachary C. Merz, Ph.D., ABPP provided supervision to the psychometrist on the date of this service to the extent necessary to assure the quality of all services provided.    Justin [name] will return within approximately 1-2 weeks for an interactive feedback session with Dr. Maryland at which time his test performances, clinical impressions, and treatment recommendations will be reviewed in detail. Justin [name] understands he can contact our office should he require our assistance before this time.  A total of 150 minutes of billable time were spent face-to-face with Justin [name] by the psychometrist. This includes both test administration and scoring time. Billing for these services is reflected in the clinical report generated by Dr. Arthea KYM [name], Ph.D., ABPP  This note reflects time spent with the psychometrician and does not include test scores or any clinical interpretations made by Dr. Maryland. The full report will follow in a separate note.

## 2024-12-01 NOTE — Progress Notes (Signed)
 NEUROPSYCHOLOGICAL EVALUATION [place]. [facility] Department of Neurology  Date of Evaluation: December 01, 2024  Reason for Referral:   Justin [name] is a 83 y.o. right-handed Caucasian male referred by Camie Sevin, PA-C, to characterize his current cognitive functioning and assist with diagnostic clarity and treatment planning in the context of a previously diagnosed mild neurocognitive disorder and concern for progressive decline.   Assessment and Plan:   Clinical Impression(s): Justin [name] pattern of performance is suggestive of an isolated impairment across semantic fluency. While he also had difficulty recalling a previously learned list of words after a brief delay, he benefited from cueing and recollection of story and figure-based content remained normatively appropriate. Performances were also appropriate relative to age-matched peers across processing speed, attention/concentration, executive functioning, receptive language, phonemic fluency, confrontation naming, and visuospatial abilities. Functionally, Justin [name] denied difficulties completing instrumental activities of daily living (ADLs) independently. 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  Relative to his previous evaluation in July 2024, the argument could be made for subtle decline surrounding semantic fluency. However, outside of this, Justin [name] exhibited a very large degree of stability over time.   The etiology for mild cognitive dysfunction is unclear. Justin [name] explicitly expressed concern surrounding Alzheimer's disease during his interview. While memory and expressive language weakness can raise potential concerns for the presence of this illness, current memory scores were normatively appropriate with the exception of a list learning task.  This, combined with stability rather than progressive decline over time, would continue to suggest it being premature to suggest the presence of a neurodegenerative illness such as Alzheimer's disease. 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 A primary vascular cause continues to remain plausible and perhaps more likely given said stability over time. Mild psychiatric distress could further exacerbate deficits. Continued medical monitoring will be important moving forward.   Recommendations: Should there be concern for progressive worsening over time, a repeat neuropsychological evaluation could be considered. This should take place no sooner than 12 months from the current date.   Should there be progression of current deficits over time, Justin [name] is unlikely to regain any independent living skills lost. Therefore, it is recommended that he remain as involved as possible in all aspects of household chores, finances, and medication management, with supervision to ensure adequate performance. He will likely benefit from the establishment and maintenance of a routine in order to maximize his functional abilities over time.   Justin [name] is encouraged to attend to lifestyle factors for brain health (e.g., regular physical exercise, good nutrition habits and consideration of the MIND-DASH diet, regular participation in cognitively-stimulating activities, and general stress management techniques), which are likely to have benefits for both emotional adjustment and cognition. Optimal control of vascular risk factors (including safe cardiovascular exercise and adherence to dietary recommendations) is encouraged. Continued participation in activities which provide mental stimulation and social interaction is also recommended.    Memory can be improved using internal strategies such as  rehearsal, repetition, chunking, mnemonics, association, and imagery. External strategies such as written notes in a consistently used memory journal, visual and nonverbal auditory cues such as a calendar on the refrigerator or appointments with alarm, such as on a cell phone, can also help maximize recall.  When learning new information, he would benefit from information being broken up into small, manageable pieces. He may also find it helpful to articulate the material in his own words and in a context to promote encoding at the onset of a new task. This material may need to be repeated multiple times to promote encoding.   Because he shows better recall for structured information, he will likely understand and retain new information better if it is presented to him in a meaningful or well-organized manner at the outset, such as grouping items into meaningful categories or presenting information in an outlined, bulleted, or story format.   Reducing anxiety may also aid in the retrieval of information. He is encouraged to prepare scripts he can use socially when he experiences difficulty with word finding or memory. Such scripts should be brief explanations of the difficulty (e.g., the word escapes me now) and allow him to move the conversation forward quickly rather than dwelling on the issue.  Review of Records:   Justin [name] completed a comprehensive neuropsychological evaluation with myself on 07/16/2023. Results suggested an isolated impairment learning and later recalling a list of words. Relative weaknesses were also exhibited across semantic fluency, confrontation naming, and the learning and later recollection of story-based content; however, these scores were no lower than the below average normative range and certainly not consistent with an objective impairment at the present time. He was ultimately diagnosed with a mild neurocognitive disorder. Repeat testing in 12-24 months was  recommended.   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   Gait abnormality    GERD (gastroesophageal reflux disease)    Gout    History of loop recorder    History of squamous cell carcinoma    Hyperlipidemia 12/13/2014   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 Waddell   BASAL CELL CARCINOMA EXCISION     off of back   ELECTROPHYSIOLOGIC STUDY N/A 05/01/2015   Procedure: SVT Ablation;  Surgeon: Danelle LELON Waddell, MD;  Location: [facility] INVASIVE CV LAB;  Service: Cardiovascular;  Laterality: N/A;   EP IMPLANTABLE DEVICE N/A 06/04/2016   Procedure: Loop Recorder Insertion;  Surgeon:  Lynwood Rakers, MD;   Location: [facility] INVASIVE CV LAB;  Service: Cardiovascular;  Laterality: N/A;   implantable loop recorder removal     ILR removed by Dr Rakers   INGUINAL HERNIA REPAIR Bilateral 06/16/2017   Procedure: LAPAROSCOPIC BILATERAL INGUINAL HERNIA REPAIR;  Surgeon: Signe Mitzie LABOR, MD;  Location[facility] OR;  Service: General;  Laterality: Bilateral;   INSERTION OF MESH Bilateral 06/16/2017   Procedure: INSERTION OF MESH;  Surgeon: Signe Mitzie LABOR, MD;  Location: [facility] OR;  Service: General;  Laterality: Bilateral;   MASS EXCISION Right 07/28/2018   Procedure: EXCISION OF RIGHT FOREARM MASS;  Surgeon: Signe Mitzie LABOR, MD;  Location: [facility] ORS;  Service: General;  Laterality: Right;   SQUAMOUS CELL CARCINOMA EXCISION     TEE WITHOUT CARDIOVERSION N/A 06/04/2016   Procedure: TRANSESOPHAGEAL ECHOCARDIOGRAM (TEE);  Surgeon: Vinie JAYSON Maxcy, MD;  Location: [facility] ENDOSCOPY;  Service: Cardiovascular;  Laterality: N/A;    Current Outpatient Medications:    B Complex-C (SUPER B-COMPLEX + VITAMIN C PO), Take 1 capsule by mouth daily with supper., Disp: , Rfl:    cyanocobalamin  1000 MCG tablet, Take 1,000 mcg by mouth daily., Disp: , Rfl:    denosumab  (PROLIA ) 60 MG/ML SOSY injection, Inject 60 mg into the skin every 6 (six) months., Disp: , Rfl:    FREESTYLE LITE test strip, , Disp: , Rfl:    glucose monitoring kit (FREESTYLE) monitoring kit, 1 each by Does not apply route as needed for other., Disp: , Rfl:    Lancets (FREESTYLE) lancets, 1 each daily., Disp: , Rfl:    Magnesium 400 MG CAPS, Take 400 mg by mouth daily., Disp: , Rfl:    memantine  (NAMENDA ) 5 MG tablet, Take 1 tablet (5 mg total) by mouth at bedtime., Disp: 90 tablet, Rfl: 1   metFORMIN  (GLUCOPHAGE ) 1000 MG tablet, TAKE ONE-HALF (1/2) TABLET DAILY WITH BREAKFAST, Disp: 90 tablet, Rfl: 3   nitroGLYCERIN  (NITROSTAT ) 0.4 MG SL tablet, Place 1 tablet (0.4 mg total) under the tongue every 5 (five) minutes as needed for chest pain (Max 3 doses in 15 mins.).,  Disp: 30 tablet, Rfl: 0   omeprazole  (PRILOSEC) 20 MG capsule, Take 1 capsule (20 mg total) by mouth every morning., Disp: 90 capsule, Rfl: 3   Potassium Gluconate 80 MG TABS, as directed Orally, Disp: , Rfl:    pravastatin  (PRAVACHOL ) 10 MG tablet, Take 1 tablet (10 mg total) by mouth daily., Disp: 90 tablet, Rfl: 3   pregabalin  (LYRICA ) 75 MG capsule, Take 75 mg by mouth 2 (two) times daily., Disp: , Rfl:    REVLIMID  5 MG capsule, TAKE 1 CAPSULE DAILY FOR 21 DAYS AND THEN TAKE 7 DAYS OFF, Disp: 21 capsule, Rfl: 0   Turmeric (QC TUMERIC COMPLEX PO), Take 1 capsule by mouth daily., Disp: , Rfl:    Vitamin D , Ergocalciferol , (DRISDOL ) 1.25 MG (50000 UNIT) CAPS capsule, TAKE 1 CAPSULE BY MOUTH 1 TIME A WEEK, Disp: 12 capsule, Rfl: 5   XARELTO  20 MG TABS tablet, TAKE 1 TABLET DAILY WITH SUPPER, Disp: 90 tablet, Rfl: 3   Zinc 50 MG TABS, Take by mouth., Disp: , Rfl:      07/12/2024   11:00 AM 01/29/2024   12:00 PM  MMSE - Mini Mental State Exam  Orientation to time 5 5  Orientation to Place 5 5  Registration 3 3  Attention/ Calculation 5 4  Recall 3 1  Language- name 2 objects 2 2  Language- repeat 1 1  Language- follow 3 step command 3 3  Language- read & follow direction 1 1  Write a sentence 1 1  Copy design 1 1  Total score 30 27      10/03/2022    7:00 AM  Montreal Cognitive Assessment   Visuospatial/ Executive (0/5) 5  Naming (0/3) 3  Attention: Read list of digits (0/2) 2  Attention: Read list of letters (0/1) 1  Attention: Serial 7 subtraction starting at 100 (0/3) 3  Language: Repeat phrase (0/2) 2  Language : Fluency (0/1) 1  Abstraction (0/2) 2  Delayed Recall (0/5) 2  Orientation (0/6) 5  Total 26  Adjusted Score (based on education) 26   Neuroimaging: Head CT on 06/01/2016 in the context of acute stroke concerns was negative for any acute findings. 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 It also suggested a 2.1 cm x 2.8 cm right parotid mass. Head CT on 10/25/2016 in the context of acute stroke concerns was negative for any acute findings. Brain MRI on 10/26/2016 revealed patchy acute small volume ischemic infarcts involving the right centrum semiovale and right parietal cortex. Brain MRI on 09/16/2022 identified a small remote lacunar infarct in the right frontal white matter. Brain MRI on 10/10/2023 was stable.   Clinical Interview:   The following information was obtained during a clinical interview with Justin [name] prior to cognitive testing.  Cognitive Symptoms: Decreased short-term memory: Endorsed. Previously, difficulties largely surrounded trouble with name recollection and word finding more broadly. He also noted some details recalling fine details of recent conversations but is generally able to recall the bigger picture items. Similar examples were provided today. Overall, difficulties were said to be present for the past two years. He expressed concern for progressive decline over time, including since his July 2024 evaluation. Decreased long-term memory: Denied. Decreased attention/concentration: Denied. Reduced processing speed: Endorsed a little bit. Difficulties with executive functions: Denied. He further denied trouble with impulsivity as well as any significant personality changes.  Difficulties with emotion regulation: Denied. Difficulties with receptive language: Denied. Difficulties with word finding: Endorsed. Decreased visuoperceptual ability: Denied.   Difficulties completing ADLs: Denied.  Additional Medical History: History of traumatic brain injury/concussion: Denied. History of stroke: Endorsed (see scans above).  History of seizure activity: Denied. History of known exposure to toxins: Denied. Symptoms of chronic pain: Denied. Experience of frequent headaches/migraines: Denied. He did previously describe occasional sharp pains  behind his right eye. These dissipate quickly. No known triggers were identified.  Frequent instances of dizziness/vertigo: Denied.   Sensory changes: He wears reading glasses with benefit and has had cataract surgery in the past. Other sensory changes/difficulties (e.g., hearing, taste, smell) were denied.  Balance/coordination difficulties: He reported mild instability at times. This is likely impacted by peripheral neuropathy and complications stemming from diabetes. He does use a cane for added support. He previously reported a few falls in the past, largely caused by his yard being on a decent slope and him losing his balance while working outside. He denied any sustained injuries from said falls. No recent falls were reported.  Other motor difficulties: Denied.  Sleep History: Estimated hours obtained each night: 6-7 hours.  Difficulties falling asleep: Denied. Difficulties staying asleep: Denied. Feels rested and refreshed upon awakening: Endorsed.    History of snoring: Unknown.  History of waking up gasping for air: Denied. Witnessed breath cessation while asleep: Denied.   History of vivid  dreaming: Denied. Excessive movement while asleep: Denied. Instances of acting out his dreams: Denied.  Psychiatric/Behavioral Health History: Depression: He described his current mood as still okay and denied to his knowledge any prior mental health concerns or formal diagnoses. Current or remote suicidal ideation, intent, or plan was denied.  Anxiety: Denied. Mania: Denied. Trauma History: Denied. Visual/auditory hallucinations: Denied. Delusional thoughts: Denied.   Tobacco: Denied. Alcohol: He denied current alcohol consumption as well as a history of problematic alcohol abuse or dependence.  Recreational drugs: Denied.  Family History: Problem Relation Age of Onset   Stroke Mother    Hypertension Mother    Language disorder Mother        Aphasia; unknown cause   Alzheimer's  disease Father    Heart failure Father    Dementia Sister        Lewy body   Down syndrome Daughter    This information was confirmed by Mr. Mee.  Academic/Vocational History: Highest level of educational attainment: 17 years. He graduated from high school and earned a Oncologist in patent attorney. He completed half of an [employer] program following this, stopping due to financial reasons. He described himself as a good (A/B) student throughout academic settings. No relative weaknesses were identified.  History of developmental delay: Denied. History of grade repetition: Denied. Enrollment in special education courses: Denied. History of LD/ADHD: Denied.   Employment: Retired.  Evaluation Results:   Behavioral Observations: Justin [name] was unaccompanied. He was appropriately dressed and groomed. He appeared alert. Observed gait and station were within normal limits. Gross motor functioning appeared intact upon informal observation and no abnormal movements (e.g., tremors) were noted. His affect was generally relaxed and positive. Spontaneous speech was fluent and word finding difficulties were not observed during the clinical interview. Thought processes were coherent, organized, and normal in content. Insight into his cognitive difficulties appeared adequate.   During testing, sustained attention was appropriate. Task engagement was adequate and he persisted when challenged. Overall, Justin [name] was cooperative with the clinical interview and subsequent testing procedures.   Adequacy of Effort: The validity of neuropsychological testing is limited by the extent to which the individual being tested may be assumed to have exerted adequate effort during testing. Justin [name] expressed his intention to perform to the best of his abilities and exhibited adequate task engagement and persistence. Scores across stand-alone and embedded performance validity measures were within expectation. As  such, the results of the current evaluation are believed to be a valid representation of Justin [name]'s current cognitive functioning.  Test Results: Justin [name] was fully oriented at the time of the current evaluation.  Intellectual abilities based upon educational and vocational attainment were estimated to be in the average to above average range. Premorbid abilities were estimated to be within the above average range based upon a single-word reading test.   Processing speed was mildly variable but overall appropriate, ranging from the below average to above average normative ranges. Basic attention was average. More complex attention (e.g., working memory) was also average. Executive functioning was average to above average.  Assessed receptive language abilities were average. Likewise, Justin [name] did not exhibit any difficulties comprehending task instructions and answered all questions asked of him appropriately. Assessed expressive language was variable. Phonemic fluency was above average, semantic fluency was well below average, and confrontation naming was average to above average.    Assessed visuospatial/visuoconstructional abilities were average to well above average.    Learning (i.e., encoding) of novel verbal  information was below average to average. Spontaneous delayed recall (i.e., retrieval) of previously learned information was exceptionally low across a list learning task but average across story and figure-based tasks. Retention rates were 0% across a list learning task, 75% across a story learning task, and 61% across a figure drawing task. Performance across recognition tasks was below average to average, suggesting evidence for information consolidation.   Results of emotional screening instruments suggested that recent symptoms of generalized anxiety were in the mild range, while symptoms of depression were also within the mild range. A screening instrument assessing recent sleep  quality suggested the presence of minimal sleep dysfunction.  Table of Scores:   Note: This summary of test scores accompanies the interpretive report and should not be considered in isolation without reference to the appropriate sections in the text. Descriptors are based on appropriate normative data and may be adjusted based on clinical judgment. Terms such as Within Normal Limits and Outside Normal Limits are used when a more specific description of the test score cannot be determined. Descriptors refer to the current evaluation only.        Percentile - Normative Descriptor > 98 - Exceptionally High 91-97 - Well Above Average 75-90 - Above Average 25-74 - Average 9-24 - Below Average 2-8 - Well Below Average < 2 - Exceptionally Low        Validity: July 2024 Current  DESCRIPTOR        DCT: --- --- --- Within Normal Limits  RBANS EI: --- --- --- Within Normal Limits  WAIS-IV RDS: --- --- --- Within Normal Limits        Orientation:       Raw Score Raw Score Percentile   NAB Orientation, Form 1 29/29 29/29 --- ---        Cognitive Screening:       Raw Score Raw Score Percentile   SLUMS: 21/30 25/30 --- ---        RBANS, Form A: Standard Score/ Scaled Score Standard Score/ Scaled Score Percentile   Total Score 86 95 37 Average  Immediate Memory 69 87 19 Below Average    List Learning 3 6 9  Below Average    Story Memory 6 10 50 Average  Visuospatial/Constructional 116 121 92 Well Above Average    Figure Copy 11 11 63 Average    Line Orientation 19/20 20/20 >75 Above Average  Language 92 86 18 Below Average    Picture Naming 9/10 10/10 >75 Above Average    Semantic Fluency 6 4 2  Well Below Average  Attention 97 94 34 Average    Digit Span 9 9 37 Average    Coding 10 9 37 Average  Delayed Memory 78 98 45 Average    List Recall 0/10 0/10 <2 Exceptionally Low    List Recognition 17/20 19/20 26-50 Average    Story Recall 7 8 25  Average    Story Recognition 8/12 8/12  14-28 Below Average to  Average    Figure Recall 9 9 37 Average    Figure Recognition 4/8 5/8 39-57 Average         Intellectual Functioning:       Standard Score Standard Score Percentile   Test of Premorbid Functioning: 109 113 81 Above Average        Attention/Executive Function:      Trail Making Test (TMT): Raw Score (T Score) Raw Score (T Score) Percentile     Part A 34 secs.,  0 errors (  51) 50 secs.,  0 errors (42) 21 Below Average    Part B 94 secs.,  0 errors (52) 114 secs.,  1 error (47) 38 Average          Scaled Score Scaled Score Percentile   WAIS-IV Digit Span: 11 10 50 Average    Forward 9 9 37 Average    Backward 13 11 63 Average    Sequencing 10 9 37 Average         Scaled Score Scaled Score Percentile   WAIS-IV Similarities: 11 13 84 Above Average        D-KEFS Color-Word Interference Test: Raw Score (Scaled Score) Raw Score (Scaled Score) Percentile     Color Naming 40 secs. (8) 42 secs. (8) 25 Average    Word Reading 24 secs. (11) 26 secs. (10) 50 Average    Inhibition 92 secs. (10) 101 secs. (9) 37 Average      Total Errors 5 errors (9) 2 errors (11) 63 Average    Inhibition/Switching 98 secs. (10) 130 secs. (6) 9 Below Average      Total Errors 1 error (13) 6 errors (8) 25 Average        D-KEFS Design Fluency Test: Raw Score (Scaled Score) Raw Score (Scaled Score) Percentile     Condition 1 - Filled Dots 6 (9) 8 (12) 75 Above Average    Condition 2 - Empty Dots 8 (11) 8 (11) 63 Average    Condition 3 - Switching 4 (9) 7 (13) 84 Above Average      Total Set Loss Errors 4 (9) 0 (13) 84 Above Average      Total Repetition Errors 1 (12) 4 (10) 50 Average        Language:      Verbal Fluency Test: Raw Score (Scaled Score) Raw Score (Scaled Score) Percentile     Phonemic Fluency (CFL) 41 (12) 42 (12) 75 Above Average    Category Fluency 25 (6) 21 (5) 5 Well Below Average  *Based on [employer] Older Normative Studies (MOANS)            NAB Language  Module, Form 1: T Score T Score Percentile     Auditory Comprehension 57 54 66 Average    Naming 27/31 (38) 28/31 (43) 25 Average        Visuospatial/Visuoconstruction:       Raw Score Raw Score Percentile   Clock Drawing: 10/10 10/10 --- Within Normal Limits         Scaled Score Scaled Score Percentile   WAIS-IV Block Design: 15 15 95 Well Above Average        Mood and Personality:       Raw Score Raw Score Percentile   Geriatric Depression Scale: 11 12 --- Mild  Geriatric Anxiety Scale: 21 19 --- Mild    Somatic 6 5 --- Minimal    Cognitive 8 6 --- Mild    Affective 7 8 --- Moderate        Additional Questionnaires:       Raw Score Raw Score Percentile   PROMIS Sleep Disturbance Questionnaire: 21 15 --- None to Slight   Informed Consent and Coding/Compliance:   The current evaluation represents a clinical evaluation for the purposes previously outlined by the referral source and is in no way reflective of a forensic evaluation.   Mr. Desa was provided with a verbal description of the nature and purpose of the present neuropsychological evaluation. Also reviewed were  the foreseeable risks and/or discomforts and benefits of the procedure, limits of confidentiality, and mandatory reporting requirements of this provider. The patient was given the opportunity to ask questions and receive answers about the evaluation. Oral consent to participate was provided by the patient.   This evaluation was conducted by Arthea KYM Maryland, Ph.D., ABPP-CN, board certified clinical neuropsychologist. Justin [name] completed a clinical interview with Dr. Maryland, billed as one unit [phone], and 150 minutes of cognitive testing and scoring, billed as one unit [phone] and four additional units [id number]. Psychometrist Evalene Pizza, B.S. assisted Dr. Maryland with test administration and scoring procedures. As a separate and discrete service, one unit [phone] and two units 96133 (170 minutes) were billed for Dr. Loralee time spent  in interpretation and report writing.

## 2024-12-10 ENCOUNTER — Ambulatory Visit (INDEPENDENT_AMBULATORY_CARE_PROVIDER_SITE_OTHER): Payer: Medicare Other | Admitting: Psychology

## 2024-12-10 DIAGNOSIS — F0670 Mild neurocognitive disorder due to known physiological condition without behavioral disturbance: Secondary | ICD-10-CM | POA: Diagnosis not present

## 2024-12-10 DIAGNOSIS — I999 Unspecified disorder of circulatory system: Secondary | ICD-10-CM

## 2024-12-10 NOTE — Progress Notes (Signed)
"  ° °  Neuropsychology Feedback Session [name] Department of Neurology  Reason for Referral:   Justin [name] is a 83 y.o. right-handed Caucasian male referred by Camie Sevin, PA-C, to characterize his current cognitive functioning and assist with diagnostic clarity and treatment planning in the context of a previously diagnosed mild neurocognitive disorder and concern for progressive decline.   Feedback:   Justin [name] completed a comprehensive neuropsychological evaluation on 12/01/2024. Please refer to that encounter for the full report and recommendations. Briefly, results suggested an isolated impairment across semantic fluency. While he also had difficulty recalling a previously learned list of words after a brief delay, he benefited from cueing and recollection of story and figure-based content remained normatively appropriate. Relative to his previous evaluation in July 2024, the argument could be made for subtle decline surrounding semantic fluency. However, outside of this, Justin [name] exhibited a very large degree of stability over time. The etiology for mild cognitive dysfunction is unclear. Justin [name] explicitly expressed concern surrounding Alzheimer's disease during his interview. While memory and expressive language weakness can raise potential concerns for the presence of this illness, current memory scores were normatively appropriate with the exception of a list learning task. This, combined with stability rather than progressive decline over time, would continue to suggest it being premature to suggest the presence of a neurodegenerative illness such as Alzheimer's disease. 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 A primary vascular cause continues to remain plausible and perhaps more likely given said stability over time. Mild  psychiatric distress could further exacerbate deficits. Continued medical monitoring will be important moving forward.   Justin [name] was unaccompanied during the current feedback session. Content of the current session focused on the results of his neuropsychological evaluation. Justin [name] was given the opportunity to ask questions and his questions were answered. He was encouraged to reach out should additional questions arise. A copy of his report was provided/mailed at the conclusion of the visit.      One unit 96132 (38 minutes) was billed for Dr. Loralee time spent preparing for, conducting, and documenting the current feedback session with Justin [name]. "

## 2024-12-21 ENCOUNTER — Other Ambulatory Visit: Payer: Self-pay

## 2024-12-21 DIAGNOSIS — C9000 Multiple myeloma not having achieved remission: Secondary | ICD-10-CM

## 2024-12-21 MED ORDER — LENALIDOMIDE 5 MG PO CAPS: 5.0000 mg | ORAL_CAPSULE | Freq: Every day | ORAL | 0 refills | Status: DC

## 2024-12-30 ENCOUNTER — Other Ambulatory Visit: Payer: Self-pay | Admitting: Internal Medicine

## 2025-01-07 ENCOUNTER — Encounter: Payer: Self-pay | Admitting: Hematology

## 2025-01-12 LAB — LAB REPORT - SCANNED
A1c: 6.9
Creatinine, POC: 50 mg/dL
EGFR: 64
Microalb Creat Ratio: 13.9
Microalbumin, Urine: 0.7

## 2025-01-13 ENCOUNTER — Ambulatory Visit: Payer: Self-pay | Admitting: Internal Medicine

## 2025-01-13 NOTE — Progress Notes (Incomplete)
 "   Mild Cognitive Impairment    Justin [name] is a very pleasant 84 y.o. 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 Patient is on memantine  5 mg twice daily (history of bradycardia)***. Patient was last seen on 07/12/2024***. Memory is ***. MMSE today is  /30. Patient is able to participate on ADLs and to to drive without difficulties. Mood is ***. This patient is here alone***.  Previous records as well as any outside records available were reviewed prior to todays visit   Follow up in 6*** months Continue memantine  5 mg twice daily, side effects discussed Continue to control mood as per PCP Recommend good control of cardiovascular risk factors Monitor driving      Discussed the use of AI scribe software for clinical note transcription with the patient, who gave verbal consent to proceed.  History of Present Illness   Any changes in memory since last visit?  A little worse especially with names. Sometimes recent conversation, new information and LTM are affected.  He is not very active, harder to do the things I used to do.  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  I still use the pilot radio. He likes visiting  his family in [place].  He does not enjoy any brain games much. repeats oneself?  Endorsed, to my wife Disoriented when walking into a room?  Patient denies    Misplacing objects?  Endorsed, especially the phone, but not frequently  Wandering behavior?    Denies. Any personality changes since last visit? Denies.  I am still the same, not very good at disciplining Any worsening depression?: denies.  He does admit to anxiety due to memory loss Hallucinations or paranoia?  Denies.   Seizures?   Denies.    Any sleep changes? Sleep  well for 6 hours.  Denies vivid dreams, REM behavior or sleepwalking   Sleep apnea?   denies    Any hygiene concerns?   Denies.   Independent of bathing and dressing?  Endorsed  Does the patient needs help with medications? Patient is in charge , I may forget a dose Who is in charge of the finances?  Patient is in charge, his wife is more involved lately  as it is becoming harder to me    Any changes in appetite?  denies     Patient have trouble swallowing?  Denies.   Does the patient cook? Yes,  any kitchen accidents such as leaving the stove on? Denies.   Any headaches?    Denies.   Vision changes? Denies. Chronic pain?  He has L3-L4 spinal canal stenosis, followed by orthopedics.  Denies having saddle anesthesia Ambulates with difficulty?    Denies.  He walks frequently, entertaining a cane for stability due to peripheral neuropathy.   Recent falls or head injuries?    Denies.      Unilateral weakness, numbness or tingling?  Denies.  Takes  pregabalin for B neuropathy  Any tremors?  Denies.   Any anosmia?    Denies.   Any  incontinence of urine?  Denies.   Any bowel dysfunction?  Denies.      Patient lives with his wife.  Does the patient drive?  Yes, sometimes I ask myself  where do I go, it concerns me at times     Initial visit 10/11/20203 How long did patient have memory difficulties?  For the last year, worse over the 2 months have more difficulty with names of known people including daughter and grand-daughter, missing appointments, and sometimes he forgets in the middle of the conversation when he was talking about.  He reports that he is STM is worse than LTM. repeats oneself? Endorsed   Disoriented when walking into a room?  Patient denies   Leaving objects in unusual places?  Patient denies   Patient lives with wife and her son  Ambulates  with difficulty?   Feels a little unsteady due to a history of peripheral neuropathy due to diabetes, not recently fell on the rear end without LOC or head injury. Set to participate in PT/OT.  Otherwise, he walks daily and works on the yard.  History of seizures?   Patient denies   Wandering behavior?  Patient denies   Patient drives?   No issues  Any mood changes ?  Sometimes I get upset with myself because I can't remember something and can be frustrating .  Any depression?:  Patient denies   Hallucinations?  Patient denies   Paranoia?  Patient denies   Patient reports that sleeps well without vivid dreams, REM behavior or sleepwalking .   History of sleep apnea?  Patient denies   Any hygiene concerns?  Patient denies   Independent of bathing and dressing?  Endorsed  Does the patient needs help with medications?   Patient in charge  Who is in charge of the finances? Patient  is in charge   Any changes in appetite?  Patient denies   Patient have trouble swallowing? Patient denies   Does the patient cook?  Patient denies   Any kitchen accidents such as leaving the stove on? Patient denies   Any headaches?  Patient denies   Double vision? Patient denies   Any focal numbness or tingling?  Has bilateral diabetic neuropathy and spinal stenosis, he is on Lyrica tolerating well.  Chronic back pain?  Patient denies   Unilateral weakness?  Patient denies   Any tremors?  Patient denies   Any history of anosmia?  Patient denies   Any incontinence of urine?  Wears pads just in case because I may dribble Any bowel dysfunction?   Intermittent diarrhea or constipation due to Revlimid   History of heavy alcohol intake?  Patient denies   History of heavy tobacco use?  Patient denies   Family history of dementia?   Sister died with LBD      MRI of the lumbar spine with and without contrast remarkable for moderate L3-L4 spinal canal stenosis due to large disc bulge and moderate facet hypertrophy.  No other spinal canal or neural foramina stenosis.     MRI of the brain 10/10/2023 without evidence of acute cranial abnormality, moderate chronic microvascular ischemic changes, small remote lacunar infarct in the right frontal white matter, no pathologic enhancement.  No pathological atrophy is noted   Neuropsych evaluation 12/10/2024 Dr. Richie Briefly, results suggested an isolated impairment across semantic fluency. While he also had difficulty recalling a previously learned list of words after a brief delay, he benefited from cueing and recollection of story and figure-based  content remained normatively appropriate. Relative to his previous evaluation in July 2024, the argument could be made for subtle decline surrounding semantic fluency. However, outside of this, Mr. Marzette exhibited a very large degree of stability over time. The etiology for mild cognitive dysfunction is unclear. Mr. Uttech explicitly expressed concern surrounding Alzheimer's disease during his interview. While memory and expressive language weakness can raise potential concerns for the presence of this illness, current memory scores were normatively appropriate with the exception of a list learning task. This, combined with stability rather than progressive decline over time, would continue to suggest it being premature to suggest the presence of a neurodegenerative illness such as Alzheimer's disease. 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 A primary vascular cause continues to remain plausible and perhaps more likely given said stability over time. Mild psychiatric distress could further exacerbate deficits. Continued medical monitoring will  be important moving forward.     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   Gait abnormality    GERD (gastroesophageal reflux disease)    Gout    History of loop recorder    History of squamous cell carcinoma    Hyperlipidemia 12/13/2014   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 Waddell   BASAL CELL CARCINOMA EXCISION     off of back   ELECTROPHYSIOLOGIC STUDY N/A 05/01/2015   Procedure: SVT Ablation;  Surgeon: Danelle LELON Waddell, MD;  Location: [facility] INVASIVE CV LAB;  Service: Cardiovascular;  Laterality: N/A;   EP IMPLANTABLE DEVICE N/A 06/04/2016    Procedure: Loop Recorder Insertion;  Surgeon: Lynwood Rakers, MD;  Location: [facility] INVASIVE CV LAB;  Service: Cardiovascular;  Laterality: N/A;   implantable loop recorder removal     ILR removed by Dr Rakers   INGUINAL HERNIA REPAIR Bilateral 06/16/2017   Procedure: LAPAROSCOPIC BILATERAL INGUINAL HERNIA REPAIR;  Surgeon: Signe Mitzie LABOR, MD;  Location: [facility] OR;  Service: General;  Laterality: Bilateral;   INSERTION OF MESH Bilateral 06/16/2017   Procedure: INSERTION OF MESH;  Surgeon:  Signe Mitzie LABOR, MD;  Location: [facility] OR;  Service: General;  Laterality: Bilateral;   MASS EXCISION Right 07/28/2018   Procedure: EXCISION OF RIGHT FOREARM MASS;  Surgeon: Signe Mitzie LABOR, MD;  Location: [facility] ORS;  Service: General;  Laterality: Right;   SQUAMOUS CELL CARCINOMA EXCISION     TEE WITHOUT CARDIOVERSION N/A 06/04/2016   Procedure: TRANSESOPHAGEAL ECHOCARDIOGRAM (TEE);  Surgeon: Vinie JAYSON Maxcy, MD;  Location: [facility] ENDOSCOPY;  Service: Cardiovascular;  Laterality: N/A;         Objective:     PHYSICAL EXAMINATION:    VITALS:  There were no vitals filed for this visit.  GEN:  The patient appears stated age and is in NAD. HEENT:  Normocephalic, atraumatic.   Neurological examination:  General: NAD, well-groomed, appears stated age. Orientation: The patient is alert. Oriented to person, place and***to date.*** Cranial nerves: There is good facial symmetry.The speech is fluent and clear. No aphasia or dysarthria. Fund of knowledge is appropriate. Recent memory impaired and remote memory is normal.  Attention and concentration are normal.  Able to name objects and repeat phrases.  Hearing is intact to conversational tone ***.   Delayed recall *** Sensation: Sensation is intact to light touch throughout Motor: Strength is at least antigravity x4. DTR's 2/4 in UE/LE      10/03/2022    7:00 AM  Montreal Cognitive Assessment   Visuospatial/ Executive (0/5) 5  Naming (0/3) 3  Attention: Read list  of digits (0/2) 2  Attention: Read list of letters (0/1) 1  Attention: Serial 7 subtraction starting at 100 (0/3) 3  Language: Repeat phrase (0/2) 2  Language : Fluency (0/1) 1  Abstraction (0/2) 2  Delayed Recall (0/5) 2  Orientation (0/6) 5  Total 26  Adjusted Score (based on education) 26       07/12/2024   11:00 AM 01/29/2024   12:00 PM  MMSE - Mini Mental State Exam  Orientation to time 5 5  Orientation to Place 5 5  Registration 3 3  Attention/ Calculation 5 4  Recall 3 1  Language- name 2 objects 2 2  Language- repeat 1 1  Language- follow 3 step command 3 3  Language- read & follow direction 1 1  Write a sentence 1 1  Copy design 1 1  Total score 30 27      Movement examination: Tone: There is normal tone in the UE/LE Abnormal movements:  no tremor.  No myoclonus.  No asterixis.   Coordination:  There is no decremation with RAM's. Normal finger to nose  Gait and Station: The patient has no difficulty arising out of a deep-seated chair without the use of the hands. The patient's stride length is good.  Gait is cautious and narrow.   Thank you for allowing us  the opportunity to participate in the care of this nice patient. Please do not hesitate to contact us  for any questions or concerns.   Total time spent on today's visit was *** minutes dedicated to this patient today, preparing to see patient, examining the patient, ordering tests and/or medications and counseling the patient, documenting clinical information in the EHR or other health record, independently interpreting results and communicating results to the patient/family, discussing treatment and goals, answering patient's questions and coordinating care.  Cc:  Loreli Kins, MD  Camie Sevin 01/13/2025 5:45 AM      "

## 2025-01-14 ENCOUNTER — Other Ambulatory Visit: Payer: Self-pay

## 2025-01-14 ENCOUNTER — Encounter: Payer: Self-pay | Admitting: Physician Assistant

## 2025-01-14 ENCOUNTER — Ambulatory Visit: Admitting: Physician Assistant

## 2025-01-14 DIAGNOSIS — C9000 Multiple myeloma not having achieved remission: Secondary | ICD-10-CM

## 2025-01-17 ENCOUNTER — Inpatient Hospital Stay

## 2025-01-19 ENCOUNTER — Inpatient Hospital Stay: Attending: Hematology

## 2025-01-19 DIAGNOSIS — C9000 Multiple myeloma not having achieved remission: Secondary | ICD-10-CM

## 2025-01-19 LAB — CMP (CANCER CENTER ONLY)
ALT: 11 U/L (ref 0–44)
AST: 26 U/L (ref 15–41)
Albumin: 4.2 g/dL (ref 3.5–5.0)
Alkaline Phosphatase: 71 U/L (ref 38–126)
Anion gap: 11 (ref 5–15)
BUN: 17 mg/dL (ref 8–23)
CO2: 26 mmol/L (ref 22–32)
Calcium: 9.2 mg/dL (ref 8.9–10.3)
Chloride: 100 mmol/L (ref 98–111)
Creatinine: 1.23 mg/dL (ref 0.61–1.24)
GFR, Estimated: 58 mL/min — ABNORMAL LOW
Glucose, Bld: 107 mg/dL — ABNORMAL HIGH (ref 70–99)
Potassium: 4.6 mmol/L (ref 3.5–5.1)
Sodium: 137 mmol/L (ref 135–145)
Total Bilirubin: 0.5 mg/dL (ref 0.0–1.2)
Total Protein: 6.5 g/dL (ref 6.5–8.1)

## 2025-01-19 LAB — CBC WITH DIFFERENTIAL (CANCER CENTER ONLY)
Abs Immature Granulocytes: 0 10*3/uL (ref 0.00–0.07)
Basophils Absolute: 0.1 10*3/uL (ref 0.0–0.1)
Basophils Relative: 2 %
Eosinophils Absolute: 0.2 10*3/uL (ref 0.0–0.5)
Eosinophils Relative: 5 %
HCT: 35.2 % — ABNORMAL LOW (ref 39.0–52.0)
Hemoglobin: 11.8 g/dL — ABNORMAL LOW (ref 13.0–17.0)
Immature Granulocytes: 0 %
Lymphocytes Relative: 29 %
Lymphs Abs: 1 10*3/uL (ref 0.7–4.0)
MCH: 30.3 pg (ref 26.0–34.0)
MCHC: 33.5 g/dL (ref 30.0–36.0)
MCV: 90.5 fL (ref 80.0–100.0)
Monocytes Absolute: 0.4 10*3/uL (ref 0.1–1.0)
Monocytes Relative: 12 %
Neutro Abs: 1.8 10*3/uL (ref 1.7–7.7)
Neutrophils Relative %: 52 %
Platelet Count: 101 10*3/uL — ABNORMAL LOW (ref 150–400)
RBC: 3.89 MIL/uL — ABNORMAL LOW (ref 4.22–5.81)
RDW: 14.6 % (ref 11.5–15.5)
WBC Count: 3.4 10*3/uL — ABNORMAL LOW (ref 4.0–10.5)
nRBC: 0 % (ref 0.0–0.2)

## 2025-01-20 LAB — KAPPA/LAMBDA LIGHT CHAINS
Kappa free light chain: 28.1 mg/L — ABNORMAL HIGH (ref 3.3–19.4)
Kappa, lambda light chain ratio: 0.89 (ref 0.26–1.65)
Lambda free light chains: 31.5 mg/L — ABNORMAL HIGH (ref 5.7–26.3)

## 2025-01-21 ENCOUNTER — Other Ambulatory Visit: Payer: Self-pay | Admitting: Hematology

## 2025-01-21 DIAGNOSIS — C9000 Multiple myeloma not having achieved remission: Secondary | ICD-10-CM

## 2025-01-21 LAB — MULTIPLE MYELOMA PANEL, SERUM
Albumin SerPl Elph-Mcnc: 3.5 g/dL (ref 2.9–4.4)
Albumin/Glob SerPl: 1.5 (ref 0.7–1.7)
Alpha 1: 0.2 g/dL (ref 0.0–0.4)
Alpha2 Glob SerPl Elph-Mcnc: 0.7 g/dL (ref 0.4–1.0)
B-Globulin SerPl Elph-Mcnc: 0.9 g/dL (ref 0.7–1.3)
Gamma Glob SerPl Elph-Mcnc: 0.7 g/dL (ref 0.4–1.8)
Globulin, Total: 2.4 g/dL (ref 2.2–3.9)
IgA: 76 mg/dL (ref 61–437)
IgG (Immunoglobin G), Serum: 795 mg/dL (ref 603–1613)
IgM (Immunoglobulin M), Srm: 18 mg/dL (ref 15–143)
M Protein SerPl Elph-Mcnc: 0.2 g/dL — ABNORMAL HIGH
Total Protein ELP: 5.9 g/dL — ABNORMAL LOW (ref 6.0–8.5)

## 2025-01-24 ENCOUNTER — Inpatient Hospital Stay: Admitting: Hematology

## 2025-01-27 ENCOUNTER — Inpatient Hospital Stay: Attending: Hematology | Admitting: Hematology

## 2025-01-27 NOTE — Progress Notes (Incomplete)
 " HEMATOLOGY ONCOLOGY PROGRESS NOTE  Date of service: 01/27/2025  Patient Care Team: Loreli Kins, MD as PCP - General (Family Medicine) Mona Vinie BROCKS, MD as PCP - Cardiology (Cardiology)  CHIEF COMPLAINT/PURPOSE OF CONSULTATION: Follow-up for continued evaluation and management of multiple myeloma.   HISTORY OF PRESENTING ILLNESS: (11/20/2016) Justin [name] is a wonderful 84 y.o. 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   Patient has a history of hypertension, diabetes, GERD, gout, squamous cell skin cancers, elevated PSA, SVT status post ablation in May 2016, chronic back pain who has a history of recurrent CVA/TIA's. He apparently had a left hemispheric TIA June 2017 and has had recurrent) subcortical infarcts thought to be related to small vessel disease. He was noted to have a small PFO that was of questionable significance. He was initially on aspirin  and then continued and aspirin  + plavix  for secondary stroke prevention. He follows with Dr. Eather Popp for his neurology cares.   An SPEP was done due to elevated total proteins of 8.9 and showed an M spike of 2 g/dL. He was also noted to have an elevated total protein of 8.6 in April 2017. He was referred to us  for further evaluation of his M spike. Blood test did not reveal any hypercalcemia, no significant renal failure. He has been noted to have developed anemia since June the serum and his hemoglobin was 12.6 and is now down to the mid 10 range. He notes no focal bone pains at this time. Overt acute weight loss. No fevers no chills no night sweats.  SUMMARY OF ONCOLOGIC HISTORY:  Oncology History  Multiple myeloma without remission  03/10/2019 Initial Diagnosis   Multiple myeloma without remission   01/19/2024 Cancer Staging   Staging form: Plasma Cell Myeloma and Plasma Cell Disorders, AJCC 8th Edition - Clinical: High-risk cytogenetics: Absent, LDH: Normal -  Signed by Onesimo Emaline Brink, MD on 01/19/2024 Stage prefix: Initial diagnosis Cytogenetics: Other mutation   CURRENT THERAPY:   Revlimid  maintenance 5 mg po 3 weeks on 1 week off.   INTERVAL HISTORY: Justin [name] is a 84 y.o. male who is here today for continued evaluation and management of Multiple Myeloma.  he was last seen by me on 11/01/2024; at the time he did not have any concerns and was doing well.    Today, he says   Having issues with right leg,   Had arthrocentesis yesterday - arthritis  Ambulating becoming more difficult. History of back issues - pinched nerves.   Seeing Ortho for knee.[place]  - M protein stable  Does interval fasting - first meal fruits and nuts, evening meal salmon/other white meats and vegetables.  - Discussed some of his medications Nerve Saver  Advised avoiding grapefruit due to delaying effect on medications.  Has been 2 weeks without Revlimid  - tolerating mostly, heavy diarrhea monthly  Dry cracking skin.   Denies any  [x]  new infection issues,  []  fevers/chills,  []  drenching night sweats,  []  unexpected weight change,  []  back pain,  []  chest pain,  []  headaches,  []  abdominal pain,  [x]  new bone pains,  []  new lumps/bumps,  []  leg swelling,  []  bleeding issues (nose bleeds, gum bleeds, abnormal/spontaneous bruising),  []  nausea/vomiting,  []  diarrhea,  []  constipation,  []  bowel/urinary changes, []  SOB,  []  change in breathing  Eating well.   REVIEW OF SYSTEMS:   10 Point review of systems of done  and is negative except as noted above.  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   Gait abnormality    GERD (gastroesophageal reflux disease)    Gout    History of loop recorder    History of squamous cell carcinoma    Hyperlipidemia 12/13/2014   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 Waddell   BASAL CELL CARCINOMA EXCISION     off of back   ELECTROPHYSIOLOGIC STUDY N/A 05/01/2015   Procedure: SVT Ablation;  Surgeon: Danelle LELON Waddell, MD;  Location: [facility] INVASIVE CV LAB;  Service: Cardiovascular;  Laterality: N/A;   EP IMPLANTABLE DEVICE N/A 06/04/2016   Procedure: Loop Recorder Insertion;  Surgeon: Lynwood Rakers, MD;  Location: [facility] INVASIVE CV LAB;  Service:  Cardiovascular;  Laterality: N/A;   implantable loop recorder removal     ILR removed by Dr Rakers   INGUINAL HERNIA REPAIR Bilateral 06/16/2017   Procedure: LAPAROSCOPIC BILATERAL INGUINAL HERNIA REPAIR;  Surgeon: Signe Mitzie LABOR, MD;  Location[facility] OR;  Service: General;  Laterality: Bilateral;   INSERTION OF MESH Bilateral 06/16/2017   Procedure: INSERTION OF MESH;  Surgeon: Signe Mitzie LABOR, MD;  Location: [facility] OR;  Service: General;  Laterality: Bilateral;   MASS EXCISION Right 07/28/2018   Procedure: EXCISION OF RIGHT FOREARM MASS;  Surgeon: Signe Mitzie LABOR, MD;  Location: [facility] ORS;  Service: General;  Laterality: Right;   SQUAMOUS CELL CARCINOMA EXCISION     TEE WITHOUT CARDIOVERSION N/A 06/04/2016   Procedure: TRANSESOPHAGEAL ECHOCARDIOGRAM (TEE);  Surgeon: Vinie JAYSON Maxcy, MD;  Location: [facility] ENDOSCOPY;  Service: Cardiovascular;  Laterality: N/A;    SOCIAL HISTORY Social History[1]  Social History   Social History Narrative   Right handed   No caffeine   Two story home   Retired   Surveyor, quantity   SOCIAL DRIVERS OF HEALTH SDOH Screenings   Tobacco Use: Low Risk (12/01/2024)  FAMILY HISTORY Family History  Problem Relation Age of Onset   Stroke Mother    Hypertension Mother    Language disorder Mother        Aphasia; unknown cause   Alzheimer's disease Father    Heart failure Father    Dementia Sister        Lewy body   Down syndrome Daughter     ALLERGIES: is allergic to other and no known allergies.  MEDICATIONS  Current Outpatient Medications  Medication Sig Dispense Refill   B Complex-C (SUPER B-COMPLEX + VITAMIN C PO) Take 1 capsule by mouth daily with supper.     cyanocobalamin  1000 MCG tablet Take 1,000 mcg by mouth daily.     denosumab  (PROLIA ) 60 MG/ML SOSY injection Inject 60 mg into the skin every 6 (six) months.     FREESTYLE LITE test strip      glucose monitoring kit (FREESTYLE) monitoring kit 1 each by Does not apply route as needed for other.      Lancets (FREESTYLE) lancets 1 each daily.     Magnesium 400 MG CAPS Take 400 mg by mouth daily.     memantine  (NAMENDA ) 5 MG tablet Take 1 tablet (5 mg total) by mouth at bedtime. 90 tablet 1   metFORMIN  (GLUCOPHAGE ) 1000 MG tablet TAKE ONE-HALF (1/2) TABLET DAILY WITH BREAKFAST 90 tablet 3   nitroGLYCERIN  (NITROSTAT ) 0.4 MG SL tablet Place 1 tablet (0.4 mg total) under the tongue every 5 (five) minutes as needed for chest pain (Max 3 doses in 15 mins.). 30 tablet 0   omeprazole  (PRILOSEC) 20 MG capsule Take 1 capsule (20 mg total) by mouth every morning. 90 capsule 3   Potassium Gluconate 80 MG TABS as directed Orally     pravastatin  (PRAVACHOL ) 10 MG tablet TAKE 1 TABLET(10 MG) BY MOUTH DAILY 90 tablet 0   pregabalin (LYRICA) 75 MG capsule Take 75 mg by mouth 2 (two) times daily.     REVLIMID  5 MG capsule TAKE 1 CAPSULE DAILY FOR 21 DAYS AND THEN TAKE 7 DAYS OFF. 21 capsule 0   Turmeric (QC TUMERIC COMPLEX PO) Take 1 capsule by mouth daily.     Vitamin D , Ergocalciferol , (DRISDOL ) 1.25 MG (50000 UNIT) CAPS capsule TAKE 1 CAPSULE BY MOUTH 1 TIME A WEEK 12 capsule 5   XARELTO  20 MG TABS tablet TAKE 1 TABLET DAILY WITH SUPPER 90 tablet 3   Zinc 50 MG TABS Take by mouth.     No current facility-administered medications for this visit.     PHYSICAL EXAMINATION: ECOG PERFORMANCE STATUS: {CHL ONC ECOG ED:[id number]} VITALS: There were no vitals filed for this visit. There were no vitals filed for this visit. There is no height or weight on file to calculate BMI.  GENERAL: alert, in no acute distress and comfortable SKIN: no acute rashes, no significant lesions EYES: conjunctiva are pink and non-injected, sclera anicteric OROPHARYNX: MMM, no exudates, no oropharyngeal erythema or ulceration NECK: supple, no JVD LYMPH:  no palpable lymphadenopathy in the cervical, axillary or inguinal regions LUNGS: clear to auscultation b/l with normal respiratory effort HEART: regular rate &  rhythm ABDOMEN:  normoactive bowel sounds , non tender, not distended, no hepatosplenomegaly Extremity: no pedal edema PSYCH: alert & oriented x 3 with fluent speech NEURO: no focal motor/sensory deficits  LABORATORY DATA:   I have reviewed the data as listed     Latest Ref Rng & Units 01/19/2025    1:59 PM 10/19/2024   10:57  AM 07/26/2024   11:08 AM  CBC EXTENDED  WBC 4.0 - 10.5 K/uL 3.4  4.7  3.1   RBC 4.22 - 5.81 MIL/uL 3.89  4.39  4.22   Hemoglobin 13.0 - 17.0 g/dL 88.1  86.5  87.1   HCT 39.0 - 52.0 % 35.2  38.7  37.9   Platelets 150 - 400 K/uL 101  106  115   NEUT# 1.7 - 7.7 K/uL 1.8  3.0  1.5   Lymph# 0.7 - 4.0 K/uL 1.0  0.7  0.8     - Discussed lab results on 01/27/2025 in detail with patient: {ELGKCBCMK2:34678} {RFE:65320} {ELGKOtherLabs (Optional):33764}  {ELGK_Labs:34608}     Latest Ref Rng & Units 01/19/2025    1:59 PM 10/19/2024   10:57 AM 07/26/2024   11:08 AM  CMP  Glucose 70 - 99 mg/dL 892  879  876   BUN 8 - 23 mg/dL 17  22  23    Creatinine 0.61 - 1.24 mg/dL 8.76  8.77  8.75   Sodium 135 - 145 mmol/L 137  137  138   Potassium 3.5 - 5.1 mmol/L 4.6  4.2  4.4   Chloride 98 - 111 mmol/L 100  103  104   CO2 22 - 32 mmol/L 26  26  29    Calcium  8.9 - 10.3 mg/dL 9.2  9.4  8.5   Total Protein 6.5 - 8.1 g/dL 6.5  6.7  6.4   Total Bilirubin 0.0 - 1.2 mg/dL 0.5  0.7  0.7   Alkaline Phos 38 - 126 U/L 71  64  65   AST 15 - 41 U/L 26  21  19    ALT 0 - 44 U/L 11  11  10     MULTIPLE MYELOMA & KAPPA/LAMBDA LIGHT CHAINS 04/2023 - 09/2024   PREVIOUS STUDIES:        RADIOGRAPHIC STUDIES: I have personally reviewed the radiological images as listed and agreed with the findings in the report.             Bone Marrow Biopsy 12/24/2016 (Accession [id number])  Diagnosis Bone Marrow, Aspirate,Biopsy, and Clot, left iliac crest - HYPERCELLULAR BONE MARROW FOR AGE WITH PLASMA CELL NEOPLASM. - SEE COMMENT. PERIPHERAL BLOOD: - NORMOCYTIC-NORMOCHROMIC ANEMIA. -  LEUKOPENIA.  DG Bone Survey Met (Accession [id number]) (Order [id number])  CLINICAL DATA:  Monoclonal gammopathy of unknown significance. History of squamous cell carcinoma excision, basal cell carcinoma excision.   EXAM: METASTATIC BONE SURVEY   COMPARISON:  CT neck, chest, abdomen and pelvis from 10/29/16, MRI of the head from 06/01/2016, CXR 10/27/2016, lumbar spine radiographs 06/24/2016   FINDINGS: Lateral skull: Small occipital lucency which may represent a normal arachnoid granulation posteriorly in the occiput. No definite lytic abnormality.   Cervical spine AP and lateral: Disc space narrowing at C2-3, and from C4 through C7. C4-5, C5-6 and C6-7 uncovertebral joint spurring bilaterally. No lytic abnormality.   Thoracic spine AP and lateral: T8-9 right-sided osteophytes and to a lesser degree T9-10. No lytic abnormality. Slight multilevel lumbar thoracic disc space narrowing likely degenerative.   Lumbar spine AP and lateral: Disc space narrowing at L4-5 and to a greater extent L5-S1. No lytic disease. L3 through S1 facet sclerosis.   AP pelvis: Negative for lytic disease.   Bilateral upper and lower extremities shoulders through wrist and from the hips through ankle: Negative for lytic disease. Joint space narrowing of the femorotibial compartments both knees. Subchondral cyst of the right patella.   CXR: Clear lungs. Cardiac  implantable monitoring device projects over the left heart. Aortic atherosclerosis. No lytic disease.   IMPRESSION: No findings suspicious for lytic disease.   RADIOGRAPHIC STUDIES: I have personally reviewed the radiological images as listed and agreed with the findings in the report. No results found.  ASSESSMENT & PLAN:  84 y.o. male with    PLAN: - Discussed lab results on 01/27/2025 in detail with patient: {ELGKCBCMK2:34678} {RFE:65320} {ELGKOtherLabs (Optional):33764}  FOLLOW-UP in {WEEKS/MONTHS:30939} for labs and follow-up  with Dr. Onesimo.  The total time spent in the appointment was *** minutes* .  All of the patient's questions were answered and the patient knows to call the clinic with any problems, questions, or concerns.  Emaline Onesimo MD MS AAHIVMS [facility] Hematology/Oncology Physician [facility]  *Total Encounter Time as defined by the Centers for Medicare and Medicaid Services includes, in addition to the face-to-face time of a patient visit (documented in the note above) non-face-to-face time: obtaining and reviewing outside history, ordering and reviewing medications, tests or procedures, care coordination (communications with other health care professionals or caregivers) and documentation in the medical record.  I,Emily Lagle,acting as a neurosurgeon for Emaline Onesimo, MD.,have documented all relevant documentation on the behalf of Emaline Onesimo, MD,as directed by  Emaline Onesimo, MD while in the presence of Emaline Onesimo, MD.  I have reviewed the above documentation for accuracy and completeness, and I agree with the above.  Emaline Onesimo, MD      [1]  Social History Tobacco Use   Smoking status: Never   Smokeless tobacco: Never  Vaping Use   Vaping status: Never Used  Substance Use Topics   Alcohol use: No   Drug use: No   "

## 2025-04-12 ENCOUNTER — Inpatient Hospital Stay: Attending: Hematology

## 2025-04-19 ENCOUNTER — Inpatient Hospital Stay: Admitting: Hematology

## 2025-05-09 ENCOUNTER — Inpatient Hospital Stay: Attending: Hematology

## 2025-07-26 ENCOUNTER — Inpatient Hospital Stay: Attending: Hematology

## 2026-01-24 ENCOUNTER — Inpatient Hospital Stay: Attending: Hematology
